# Patient Record
Sex: Male | Born: 1935 | Race: White | Hispanic: No | Marital: Single | Smoking: Never smoker
Health system: Southern US, Community
[De-identification: ages and names within clinical notes are randomized; demographics above are authoritative.]

## PROBLEM LIST (undated history)

## (undated) DIAGNOSIS — E782 Mixed hyperlipidemia: Secondary | ICD-10-CM

## (undated) DIAGNOSIS — K921 Melena: Secondary | ICD-10-CM

## (undated) DIAGNOSIS — F419 Anxiety disorder, unspecified: Secondary | ICD-10-CM

## (undated) DIAGNOSIS — N183 Chronic kidney disease, stage 3 unspecified: Secondary | ICD-10-CM

## (undated) DIAGNOSIS — J189 Pneumonia, unspecified organism: Secondary | ICD-10-CM

## (undated) DIAGNOSIS — E119 Type 2 diabetes mellitus without complications: Secondary | ICD-10-CM

## (undated) DIAGNOSIS — I10 Essential (primary) hypertension: Secondary | ICD-10-CM

## (undated) DIAGNOSIS — I350 Nonrheumatic aortic (valve) stenosis: Secondary | ICD-10-CM

## (undated) DIAGNOSIS — M1990 Unspecified osteoarthritis, unspecified site: Secondary | ICD-10-CM

## (undated) DIAGNOSIS — I429 Cardiomyopathy, unspecified: Secondary | ICD-10-CM

## (undated) DIAGNOSIS — K219 Gastro-esophageal reflux disease without esophagitis: Secondary | ICD-10-CM

## (undated) DIAGNOSIS — M549 Dorsalgia, unspecified: Secondary | ICD-10-CM

## (undated) DIAGNOSIS — I6529 Occlusion and stenosis of unspecified carotid artery: Secondary | ICD-10-CM

## (undated) DIAGNOSIS — I2510 Atherosclerotic heart disease of native coronary artery without angina pectoris: Secondary | ICD-10-CM

## (undated) DIAGNOSIS — Z87891 Personal history of nicotine dependence: Secondary | ICD-10-CM

## (undated) DIAGNOSIS — Z5189 Encounter for other specified aftercare: Secondary | ICD-10-CM

## (undated) DIAGNOSIS — I4891 Unspecified atrial fibrillation: Secondary | ICD-10-CM

## (undated) DIAGNOSIS — D46C Myelodysplastic syndrome with isolated del(5q) chromosomal abnormality: Secondary | ICD-10-CM

## (undated) DIAGNOSIS — J449 Chronic obstructive pulmonary disease, unspecified: Secondary | ICD-10-CM

## (undated) DIAGNOSIS — D649 Anemia, unspecified: Secondary | ICD-10-CM

## (undated) DIAGNOSIS — D696 Thrombocytopenia, unspecified: Secondary | ICD-10-CM

## (undated) DIAGNOSIS — I219 Acute myocardial infarction, unspecified: Secondary | ICD-10-CM

## (undated) HISTORY — DX: Myelodysplastic syndrome with isolated del(5q) chromosomal abnormality: D46.C

## (undated) HISTORY — PX: CHOLECYSTECTOMY: SHX55

## (undated) HISTORY — DX: Acute myocardial infarction, unspecified: I21.9

## (undated) HISTORY — PX: KNEE SURGERY: SHX244

## (undated) HISTORY — PX: HERNIA REPAIR: SHX51

## (undated) HISTORY — PX: BONE MARROW BIOPSY: SHX199

## (undated) HISTORY — PX: INGUINAL HERNIA REPAIR: SUR1180

## (undated) HISTORY — PX: EXPLORATION POST OPERATIVE OPEN HEART: SHX5061

## (undated) HISTORY — PX: CATARACT EXTRACTION, BILATERAL: SHX1313

## (undated) HISTORY — DX: Thrombocytopenia, unspecified: D69.6

## (undated) HISTORY — PX: SPINE SURGERY: SHX786

## (undated) HISTORY — DX: Unspecified atrial fibrillation: I48.91

## (undated) HISTORY — PX: BONE MARROW ASPIRATION: SHX1252

## (undated) HISTORY — DX: Encounter for other specified aftercare: Z51.89

## (undated) HISTORY — PX: BACK SURGERY: SHX140

## (undated) HISTORY — DX: Unspecified osteoarthritis, unspecified site: M19.90

## (undated) HISTORY — DX: Pneumonia, unspecified organism: J18.9

## (undated) HISTORY — PX: VENTRAL HERNIA REPAIR: SHX424

## (undated) HISTORY — PX: TONSILLECTOMY: SUR1361

## (undated) HISTORY — DX: Chronic obstructive pulmonary disease, unspecified: J44.9

---

## 1958-09-27 HISTORY — PX: KNEE SURGERY: SHX244

## 1958-09-27 HISTORY — PX: JOINT REPLACEMENT: SHX530

## 1988-09-27 HISTORY — PX: ROTATOR CUFF REPAIR: SHX139

## 1994-09-27 HISTORY — PX: PR VEIN BYPASS GRAFT,AORTO-FEM-POP: 35551

## 1994-09-27 HISTORY — PX: CORONARY ARTERY BYPASS GRAFT: SHX141

## 1998-09-12 ENCOUNTER — Ambulatory Visit (HOSPITAL_COMMUNITY): Admission: RE | Admit: 1998-09-12 | Discharge: 1998-09-13 | Payer: Self-pay | Admitting: Cardiology

## 2001-08-28 ENCOUNTER — Other Ambulatory Visit: Admission: RE | Admit: 2001-08-28 | Discharge: 2001-08-28 | Payer: Self-pay | Admitting: General Surgery

## 2001-09-05 ENCOUNTER — Encounter: Payer: Self-pay | Admitting: Cardiology

## 2001-09-05 ENCOUNTER — Ambulatory Visit (HOSPITAL_COMMUNITY): Admission: RE | Admit: 2001-09-05 | Discharge: 2001-09-05 | Payer: Self-pay | Admitting: Cardiology

## 2001-09-06 ENCOUNTER — Ambulatory Visit (HOSPITAL_COMMUNITY): Admission: RE | Admit: 2001-09-06 | Discharge: 2001-09-06 | Payer: Self-pay | Admitting: Cardiology

## 2001-09-06 ENCOUNTER — Encounter: Payer: Self-pay | Admitting: Cardiology

## 2001-09-12 ENCOUNTER — Ambulatory Visit (HOSPITAL_COMMUNITY): Admission: RE | Admit: 2001-09-12 | Discharge: 2001-09-12 | Payer: Self-pay | Admitting: Internal Medicine

## 2001-10-04 ENCOUNTER — Observation Stay (HOSPITAL_COMMUNITY): Admission: RE | Admit: 2001-10-04 | Discharge: 2001-10-05 | Payer: Self-pay | Admitting: General Surgery

## 2002-11-20 ENCOUNTER — Encounter: Payer: Self-pay | Admitting: Cardiology

## 2002-11-20 ENCOUNTER — Ambulatory Visit (HOSPITAL_COMMUNITY): Admission: RE | Admit: 2002-11-20 | Discharge: 2002-11-20 | Payer: Self-pay | Admitting: Cardiology

## 2003-01-02 ENCOUNTER — Encounter (HOSPITAL_COMMUNITY): Admission: RE | Admit: 2003-01-02 | Discharge: 2003-02-01 | Payer: Self-pay | Admitting: Cardiology

## 2003-09-11 ENCOUNTER — Ambulatory Visit (HOSPITAL_COMMUNITY): Admission: RE | Admit: 2003-09-11 | Discharge: 2003-09-11 | Payer: Self-pay | Admitting: Pulmonary Disease

## 2004-02-29 ENCOUNTER — Emergency Department (HOSPITAL_COMMUNITY): Admission: EM | Admit: 2004-02-29 | Discharge: 2004-02-29 | Payer: Self-pay | Admitting: Emergency Medicine

## 2004-03-04 ENCOUNTER — Observation Stay (HOSPITAL_COMMUNITY): Admission: AD | Admit: 2004-03-04 | Discharge: 2004-03-06 | Payer: Self-pay | Admitting: Pulmonary Disease

## 2004-05-25 ENCOUNTER — Ambulatory Visit (HOSPITAL_COMMUNITY): Admission: RE | Admit: 2004-05-25 | Discharge: 2004-05-25 | Payer: Self-pay | Admitting: Ophthalmology

## 2004-08-31 ENCOUNTER — Ambulatory Visit (HOSPITAL_COMMUNITY): Admission: RE | Admit: 2004-08-31 | Discharge: 2004-08-31 | Payer: Self-pay | Admitting: Ophthalmology

## 2006-02-25 HISTORY — PX: BACK SURGERY: SHX140

## 2006-03-10 ENCOUNTER — Ambulatory Visit (HOSPITAL_COMMUNITY): Admission: RE | Admit: 2006-03-10 | Discharge: 2006-03-11 | Payer: Self-pay | Admitting: Neurosurgery

## 2006-03-19 ENCOUNTER — Inpatient Hospital Stay (HOSPITAL_COMMUNITY): Admission: EM | Admit: 2006-03-19 | Discharge: 2006-03-29 | Payer: Self-pay | Admitting: Emergency Medicine

## 2006-03-20 ENCOUNTER — Ambulatory Visit: Payer: Self-pay | Admitting: Critical Care Medicine

## 2006-03-22 ENCOUNTER — Ambulatory Visit: Payer: Self-pay | Admitting: Cardiology

## 2006-03-22 ENCOUNTER — Encounter: Payer: Self-pay | Admitting: Cardiology

## 2006-03-22 ENCOUNTER — Encounter: Payer: Self-pay | Admitting: Critical Care Medicine

## 2006-03-23 ENCOUNTER — Ambulatory Visit: Payer: Self-pay | Admitting: Infectious Diseases

## 2006-05-18 ENCOUNTER — Ambulatory Visit: Payer: Self-pay | Admitting: Infectious Diseases

## 2006-05-31 ENCOUNTER — Inpatient Hospital Stay (HOSPITAL_COMMUNITY): Admission: AD | Admit: 2006-05-31 | Discharge: 2006-07-12 | Payer: Self-pay | Admitting: Neurosurgery

## 2006-05-31 ENCOUNTER — Ambulatory Visit: Payer: Self-pay | Admitting: Pulmonary Disease

## 2006-06-06 ENCOUNTER — Ambulatory Visit: Payer: Self-pay | Admitting: Physical Medicine & Rehabilitation

## 2006-06-08 ENCOUNTER — Encounter: Payer: Self-pay | Admitting: Vascular Surgery

## 2006-06-13 ENCOUNTER — Ambulatory Visit: Payer: Self-pay | Admitting: Cardiovascular Disease

## 2006-06-13 ENCOUNTER — Encounter: Payer: Self-pay | Admitting: Cardiovascular Disease

## 2006-07-12 ENCOUNTER — Ambulatory Visit: Payer: Self-pay | Admitting: Physical Medicine & Rehabilitation

## 2006-07-12 ENCOUNTER — Inpatient Hospital Stay (HOSPITAL_COMMUNITY)
Admission: RE | Admit: 2006-07-12 | Discharge: 2006-08-08 | Payer: Self-pay | Admitting: Physical Medicine & Rehabilitation

## 2006-07-28 HISTORY — PX: PEG PLACEMENT: SHX5437

## 2006-08-08 ENCOUNTER — Ambulatory Visit: Payer: Self-pay | Admitting: Internal Medicine

## 2006-08-08 ENCOUNTER — Inpatient Hospital Stay: Admission: AD | Admit: 2006-08-08 | Discharge: 2006-11-11 | Payer: Self-pay | Admitting: Pulmonary Disease

## 2006-10-07 ENCOUNTER — Ambulatory Visit (HOSPITAL_COMMUNITY): Admission: RE | Admit: 2006-10-07 | Discharge: 2006-10-07 | Payer: Self-pay | Admitting: Pulmonary Disease

## 2006-11-11 ENCOUNTER — Ambulatory Visit: Payer: Self-pay | Admitting: Internal Medicine

## 2007-02-16 ENCOUNTER — Encounter (HOSPITAL_COMMUNITY): Admission: RE | Admit: 2007-02-16 | Discharge: 2007-03-18 | Payer: Self-pay | Admitting: Pulmonary Disease

## 2007-02-22 ENCOUNTER — Ambulatory Visit: Payer: Self-pay | Admitting: Cardiology

## 2007-03-09 ENCOUNTER — Ambulatory Visit: Payer: Self-pay | Admitting: Internal Medicine

## 2007-03-14 ENCOUNTER — Ambulatory Visit (HOSPITAL_COMMUNITY): Admission: RE | Admit: 2007-03-14 | Discharge: 2007-03-14 | Payer: Self-pay | Admitting: Internal Medicine

## 2007-03-20 ENCOUNTER — Encounter (HOSPITAL_COMMUNITY): Admission: RE | Admit: 2007-03-20 | Discharge: 2007-04-19 | Payer: Self-pay | Admitting: Pulmonary Disease

## 2007-03-22 ENCOUNTER — Ambulatory Visit: Payer: Self-pay | Admitting: Cardiovascular Disease

## 2007-04-05 ENCOUNTER — Ambulatory Visit: Payer: Self-pay | Admitting: Cardiology

## 2007-04-12 ENCOUNTER — Ambulatory Visit (HOSPITAL_COMMUNITY): Admission: RE | Admit: 2007-04-12 | Discharge: 2007-04-12 | Payer: Self-pay | Admitting: Pulmonary Disease

## 2007-04-21 ENCOUNTER — Encounter (HOSPITAL_COMMUNITY): Admission: RE | Admit: 2007-04-21 | Discharge: 2007-05-22 | Payer: Self-pay | Admitting: Pulmonary Disease

## 2007-05-03 ENCOUNTER — Ambulatory Visit: Payer: Self-pay | Admitting: Cardiology

## 2007-05-22 ENCOUNTER — Encounter (HOSPITAL_COMMUNITY): Admission: RE | Admit: 2007-05-22 | Discharge: 2007-06-21 | Payer: Self-pay | Admitting: Pulmonary Disease

## 2007-05-31 ENCOUNTER — Ambulatory Visit: Payer: Self-pay | Admitting: Cardiology

## 2007-06-14 ENCOUNTER — Ambulatory Visit: Payer: Self-pay | Admitting: Cardiology

## 2007-06-21 ENCOUNTER — Ambulatory Visit: Payer: Self-pay | Admitting: Cardiology

## 2007-06-23 ENCOUNTER — Encounter (HOSPITAL_COMMUNITY): Admission: RE | Admit: 2007-06-23 | Discharge: 2007-06-27 | Payer: Self-pay | Admitting: Pulmonary Disease

## 2007-06-28 ENCOUNTER — Encounter (HOSPITAL_COMMUNITY): Admission: RE | Admit: 2007-06-28 | Discharge: 2007-07-28 | Payer: Self-pay | Admitting: Pulmonary Disease

## 2007-07-10 ENCOUNTER — Ambulatory Visit: Payer: Self-pay | Admitting: Internal Medicine

## 2007-07-24 ENCOUNTER — Ambulatory Visit: Payer: Self-pay | Admitting: Internal Medicine

## 2007-07-31 ENCOUNTER — Ambulatory Visit: Payer: Self-pay | Admitting: Cardiology

## 2007-08-07 ENCOUNTER — Ambulatory Visit: Payer: Self-pay | Admitting: Cardiology

## 2007-08-22 ENCOUNTER — Ambulatory Visit: Payer: Self-pay | Admitting: Cardiology

## 2007-08-28 ENCOUNTER — Ambulatory Visit: Payer: Self-pay | Admitting: Internal Medicine

## 2007-08-28 ENCOUNTER — Encounter (HOSPITAL_COMMUNITY): Admission: RE | Admit: 2007-08-28 | Discharge: 2007-09-27 | Payer: Self-pay | Admitting: Internal Medicine

## 2007-09-19 ENCOUNTER — Ambulatory Visit: Payer: Self-pay | Admitting: Cardiology

## 2007-10-10 ENCOUNTER — Ambulatory Visit: Payer: Self-pay | Admitting: Cardiology

## 2007-11-07 ENCOUNTER — Ambulatory Visit: Payer: Self-pay | Admitting: Cardiology

## 2007-12-05 ENCOUNTER — Ambulatory Visit: Payer: Self-pay | Admitting: Cardiology

## 2008-01-19 ENCOUNTER — Ambulatory Visit: Payer: Self-pay | Admitting: Internal Medicine

## 2008-02-16 ENCOUNTER — Ambulatory Visit: Payer: Self-pay | Admitting: Cardiovascular Disease

## 2008-03-15 ENCOUNTER — Ambulatory Visit (HOSPITAL_COMMUNITY): Admission: RE | Admit: 2008-03-15 | Discharge: 2008-03-15 | Payer: Self-pay | Admitting: Internal Medicine

## 2008-03-15 ENCOUNTER — Ambulatory Visit: Payer: Self-pay | Admitting: Internal Medicine

## 2008-03-22 ENCOUNTER — Ambulatory Visit: Payer: Self-pay | Admitting: Cardiovascular Disease

## 2008-04-05 ENCOUNTER — Ambulatory Visit: Payer: Self-pay | Admitting: Cardiology

## 2008-04-18 ENCOUNTER — Observation Stay (HOSPITAL_COMMUNITY): Admission: EM | Admit: 2008-04-18 | Discharge: 2008-04-21 | Payer: Self-pay | Admitting: Emergency Medicine

## 2008-05-10 ENCOUNTER — Ambulatory Visit: Payer: Self-pay | Admitting: Cardiology

## 2008-05-17 ENCOUNTER — Ambulatory Visit: Payer: Self-pay | Admitting: Cardiology

## 2008-06-04 ENCOUNTER — Ambulatory Visit: Payer: Self-pay | Admitting: Cardiology

## 2008-06-12 ENCOUNTER — Ambulatory Visit: Payer: Self-pay | Admitting: Cardiology

## 2008-06-18 ENCOUNTER — Ambulatory Visit: Payer: Self-pay | Admitting: Cardiology

## 2008-06-27 ENCOUNTER — Ambulatory Visit: Payer: Self-pay | Admitting: Cardiology

## 2008-07-11 ENCOUNTER — Ambulatory Visit: Payer: Self-pay | Admitting: Cardiology

## 2008-07-22 ENCOUNTER — Ambulatory Visit: Payer: Self-pay | Admitting: Cardiology

## 2008-07-29 ENCOUNTER — Ambulatory Visit: Payer: Self-pay | Admitting: Cardiology

## 2008-08-12 ENCOUNTER — Ambulatory Visit: Payer: Self-pay | Admitting: Cardiology

## 2008-08-30 ENCOUNTER — Ambulatory Visit: Payer: Self-pay | Admitting: Internal Medicine

## 2008-08-30 LAB — CONVERTED CEMR LAB
ALT: 24 units/L (ref 0–53)
Albumin: 3.6 g/dL (ref 3.5–5.2)
Bilirubin, Direct: 0.1 mg/dL (ref 0.0–0.3)
TSH: 2.37 microintl units/mL (ref 0.35–5.50)
Total Protein: 6.9 g/dL (ref 6.0–8.3)

## 2008-09-09 ENCOUNTER — Ambulatory Visit: Payer: Self-pay | Admitting: Cardiology

## 2008-09-30 ENCOUNTER — Ambulatory Visit (HOSPITAL_COMMUNITY): Admission: RE | Admit: 2008-09-30 | Discharge: 2008-09-30 | Payer: Self-pay | Admitting: Pulmonary Disease

## 2008-10-07 ENCOUNTER — Ambulatory Visit: Payer: Self-pay | Admitting: Cardiology

## 2008-11-04 ENCOUNTER — Ambulatory Visit: Payer: Self-pay | Admitting: Cardiology

## 2008-11-11 ENCOUNTER — Ambulatory Visit: Payer: Self-pay | Admitting: Cardiology

## 2008-12-02 ENCOUNTER — Ambulatory Visit: Payer: Self-pay | Admitting: Cardiology

## 2008-12-23 ENCOUNTER — Ambulatory Visit: Payer: Self-pay | Admitting: Cardiology

## 2009-01-20 ENCOUNTER — Ambulatory Visit: Payer: Self-pay | Admitting: Cardiology

## 2009-02-20 ENCOUNTER — Ambulatory Visit: Payer: Self-pay | Admitting: Cardiology

## 2009-03-24 ENCOUNTER — Ambulatory Visit: Payer: Self-pay | Admitting: Cardiology

## 2009-04-11 DIAGNOSIS — I4891 Unspecified atrial fibrillation: Secondary | ICD-10-CM

## 2009-04-11 DIAGNOSIS — M1990 Unspecified osteoarthritis, unspecified site: Secondary | ICD-10-CM | POA: Insufficient documentation

## 2009-04-11 DIAGNOSIS — I2510 Atherosclerotic heart disease of native coronary artery without angina pectoris: Secondary | ICD-10-CM

## 2009-04-11 DIAGNOSIS — E785 Hyperlipidemia, unspecified: Secondary | ICD-10-CM

## 2009-04-11 DIAGNOSIS — I739 Peripheral vascular disease, unspecified: Secondary | ICD-10-CM | POA: Insufficient documentation

## 2009-04-14 ENCOUNTER — Ambulatory Visit: Payer: Self-pay | Admitting: Internal Medicine

## 2009-04-17 ENCOUNTER — Ambulatory Visit (HOSPITAL_COMMUNITY): Admission: RE | Admit: 2009-04-17 | Discharge: 2009-04-17 | Payer: Self-pay | Admitting: Internal Medicine

## 2009-04-17 ENCOUNTER — Encounter: Payer: Self-pay | Admitting: Internal Medicine

## 2009-04-21 ENCOUNTER — Ambulatory Visit: Payer: Self-pay | Admitting: Cardiology

## 2009-05-12 ENCOUNTER — Encounter: Payer: Self-pay | Admitting: *Deleted

## 2009-05-26 ENCOUNTER — Ambulatory Visit: Payer: Self-pay | Admitting: Cardiology

## 2009-06-23 ENCOUNTER — Ambulatory Visit: Payer: Self-pay | Admitting: Cardiology

## 2009-06-23 LAB — CONVERTED CEMR LAB: POC INR: 2.6

## 2009-07-21 ENCOUNTER — Ambulatory Visit: Payer: Self-pay | Admitting: Cardiology

## 2009-07-21 LAB — CONVERTED CEMR LAB: POC INR: 3.3

## 2009-08-18 ENCOUNTER — Ambulatory Visit: Payer: Self-pay | Admitting: Cardiology

## 2009-09-25 ENCOUNTER — Ambulatory Visit: Payer: Self-pay | Admitting: Cardiology

## 2009-09-25 LAB — CONVERTED CEMR LAB: POC INR: 2.3

## 2009-10-06 ENCOUNTER — Ambulatory Visit: Payer: Self-pay | Admitting: Internal Medicine

## 2009-10-08 ENCOUNTER — Telehealth: Payer: Self-pay | Admitting: Internal Medicine

## 2009-10-23 ENCOUNTER — Ambulatory Visit: Payer: Self-pay | Admitting: Cardiovascular Disease

## 2009-10-23 LAB — CONVERTED CEMR LAB: POC INR: 1.9

## 2009-11-12 ENCOUNTER — Ambulatory Visit: Payer: Self-pay | Admitting: Cardiovascular Disease

## 2009-11-12 LAB — CONVERTED CEMR LAB: POC INR: 2.1

## 2009-12-11 ENCOUNTER — Ambulatory Visit: Payer: Self-pay | Admitting: Cardiology

## 2009-12-11 LAB — CONVERTED CEMR LAB: POC INR: 2.1

## 2010-01-07 ENCOUNTER — Ambulatory Visit: Payer: Self-pay | Admitting: Cardiology

## 2010-01-07 LAB — CONVERTED CEMR LAB: POC INR: 2.2

## 2010-02-02 ENCOUNTER — Ambulatory Visit: Payer: Self-pay | Admitting: Cardiology

## 2010-02-02 LAB — CONVERTED CEMR LAB: POC INR: 2.5

## 2010-03-02 ENCOUNTER — Ambulatory Visit (HOSPITAL_COMMUNITY): Admission: RE | Admit: 2010-03-02 | Discharge: 2010-03-02 | Payer: Self-pay | Admitting: General Surgery

## 2010-03-02 ENCOUNTER — Ambulatory Visit: Payer: Self-pay | Admitting: Cardiology

## 2010-04-01 ENCOUNTER — Ambulatory Visit: Payer: Self-pay | Admitting: Cardiovascular Disease

## 2010-04-27 ENCOUNTER — Ambulatory Visit: Payer: Self-pay | Admitting: Cardiology

## 2010-05-01 ENCOUNTER — Telehealth: Payer: Self-pay | Admitting: Internal Medicine

## 2010-05-25 ENCOUNTER — Ambulatory Visit: Payer: Self-pay | Admitting: Cardiology

## 2010-05-28 HISTORY — PX: CHOLECYSTECTOMY: SHX55

## 2010-06-04 ENCOUNTER — Ambulatory Visit (HOSPITAL_COMMUNITY)
Admission: RE | Admit: 2010-06-04 | Discharge: 2010-06-07 | Payer: Self-pay | Source: Home / Self Care | Admitting: General Surgery

## 2010-06-04 ENCOUNTER — Encounter (INDEPENDENT_AMBULATORY_CARE_PROVIDER_SITE_OTHER): Payer: Self-pay | Admitting: General Surgery

## 2010-06-15 ENCOUNTER — Ambulatory Visit: Payer: Self-pay | Admitting: Cardiology

## 2010-06-25 ENCOUNTER — Ambulatory Visit: Payer: Self-pay | Admitting: Cardiology

## 2010-06-25 LAB — CONVERTED CEMR LAB: POC INR: 2.1

## 2010-07-13 ENCOUNTER — Ambulatory Visit: Payer: Self-pay | Admitting: Cardiology

## 2010-07-13 LAB — CONVERTED CEMR LAB: POC INR: 2.3

## 2010-08-12 ENCOUNTER — Ambulatory Visit: Payer: Self-pay | Admitting: Cardiology

## 2010-08-12 LAB — CONVERTED CEMR LAB: POC INR: 1.9

## 2010-09-09 ENCOUNTER — Ambulatory Visit: Payer: Self-pay | Admitting: Cardiovascular Disease

## 2010-10-01 ENCOUNTER — Ambulatory Visit: Admission: RE | Admit: 2010-10-01 | Discharge: 2010-10-01 | Payer: Self-pay | Source: Home / Self Care

## 2010-10-16 ENCOUNTER — Encounter: Payer: Self-pay | Admitting: Internal Medicine

## 2010-10-16 ENCOUNTER — Ambulatory Visit
Admission: RE | Admit: 2010-10-16 | Discharge: 2010-10-16 | Payer: Self-pay | Source: Home / Self Care | Attending: Internal Medicine | Admitting: Internal Medicine

## 2010-10-16 DIAGNOSIS — I6529 Occlusion and stenosis of unspecified carotid artery: Secondary | ICD-10-CM | POA: Insufficient documentation

## 2010-10-17 ENCOUNTER — Encounter: Payer: Self-pay | Admitting: Physical Medicine & Rehabilitation

## 2010-10-18 ENCOUNTER — Encounter: Payer: Self-pay | Admitting: Pulmonary Disease

## 2010-10-19 ENCOUNTER — Encounter: Payer: Self-pay | Admitting: Internal Medicine

## 2010-10-25 LAB — CONVERTED CEMR LAB
ALT: 18 units/L (ref 0–53)
ALT: 19 units/L (ref 0–53)
Alkaline Phosphatase: 80 units/L (ref 39–117)
Bilirubin, Direct: 0 mg/dL (ref 0.0–0.3)
Bilirubin, Direct: 0.3 mg/dL (ref 0.0–0.3)
Chloride: 105 meq/L (ref 96–112)
Cholesterol: 152 mg/dL (ref 0–200)
Creatinine, Ser: 1.4 mg/dL (ref 0.4–1.5)
Indirect Bilirubin: 0.6 mg/dL (ref 0.0–0.9)
TSH: 2.16 microintl units/mL (ref 0.35–5.50)
Total Bilirubin: 0.9 mg/dL (ref 0.3–1.2)
VLDL: 29.8 mg/dL (ref 0.0–40.0)
VLDL: 49 mg/dL — ABNORMAL HIGH (ref 0–40)

## 2010-10-28 ENCOUNTER — Telehealth: Payer: Self-pay | Admitting: Internal Medicine

## 2010-10-28 ENCOUNTER — Other Ambulatory Visit: Payer: Self-pay | Admitting: Internal Medicine

## 2010-10-28 DIAGNOSIS — I6529 Occlusion and stenosis of unspecified carotid artery: Secondary | ICD-10-CM

## 2010-10-29 ENCOUNTER — Ambulatory Visit (HOSPITAL_COMMUNITY)
Admission: RE | Admit: 2010-10-29 | Discharge: 2010-10-29 | Disposition: A | Discharge status: Home / Self Care | Payer: Medicare Other | Source: Ambulatory Visit | Attending: Internal Medicine | Admitting: Internal Medicine

## 2010-10-29 ENCOUNTER — Encounter: Payer: Self-pay | Admitting: Cardiology

## 2010-10-29 ENCOUNTER — Encounter (INDEPENDENT_AMBULATORY_CARE_PROVIDER_SITE_OTHER): Payer: Medicare Other

## 2010-10-29 ENCOUNTER — Ambulatory Visit: Admit: 2010-10-29 | Payer: Self-pay

## 2010-10-29 ENCOUNTER — Ambulatory Visit (HOSPITAL_COMMUNITY): Payer: Medicare Other

## 2010-10-29 DIAGNOSIS — I6529 Occlusion and stenosis of unspecified carotid artery: Secondary | ICD-10-CM | POA: Insufficient documentation

## 2010-10-29 DIAGNOSIS — I4891 Unspecified atrial fibrillation: Secondary | ICD-10-CM

## 2010-10-29 DIAGNOSIS — Z7901 Long term (current) use of anticoagulants: Secondary | ICD-10-CM

## 2010-10-29 DIAGNOSIS — I10 Essential (primary) hypertension: Secondary | ICD-10-CM | POA: Insufficient documentation

## 2010-10-29 LAB — CONVERTED CEMR LAB: POC INR: 2.3

## 2010-10-29 NOTE — Progress Notes (Signed)
Summary: lab results  Phone Note Call from Patient Call back at Home Phone [phone]   Caller: Patient Reason for Call: Talk to Nurse, Lab or Test Results Summary of Call: pt calling back for lab results Initial call taken by: Edman Circle,  October 08, 2009 1:54 PM  Follow-up for Phone Call        PT'S WIFE AWARE OF LAB RESULTS. Follow-up by: Scherrie Bateman, LPN,  October 08, 2009 2:06 PM

## 2010-10-29 NOTE — Medication Information (Signed)
Summary: ccr-lr  Anticoagulant Therapy  Managed by: Vashti Hey, RN PCP: Dr Shaune Pollack in [place] MD: Dietrich Pates MD, Molly Maduro Indication 1: Atrial Fibrillation (ICD-427.31) Lab Used: [facility] [place] Site: [place] INR POC 3.0  Dietary changes: no    Health status changes: no    Bleeding/hemorrhagic complications: no    Recent/future hospitalizations: no    Any changes in medication regimen? no    Recent/future dental: no  Any missed doses?: no       Is patient compliant with meds? yes       Allergies: No Known Drug Allergies  Anticoagulation Management History:      The patient is taking warfarin and comes in today for a routine follow up visit.  Positive risk factors for bleeding include an age of 75 years or older.  The bleeding index is 'intermediate risk'.  Positive CHADS2 values include Age > 49 years old.  The start date was 12/01/2006.  Anticoagulation responsible provider: Dietrich Pates MD, Molly Maduro.  INR POC: 3.0.  Cuvette Lot#: [id number].  Exp: 10/11.    Anticoagulation Management Assessment/Plan:      The patient's current anticoagulation dose is Warfarin sodium 5 mg tabs: Use as directed by Anticoagulation Clinic.  The target INR is 2 - 3.  The next INR is due 04/01/2010.  Anticoagulation instructions were given to patient.  Results were reviewed/authorized by Vashti Hey, RN.  He was notified by Vashti Hey RN.         Prior Anticoagulation Instructions: INR 2.5 Continue coumadin 5mg  once daily except 7.5mg  on Mondays  Current Anticoagulation Instructions: INR 3.0 Continue coumadin 5mg  once daily except 7.5mg  on Mondays

## 2010-10-29 NOTE — Medication Information (Signed)
Summary: ccr-lr  Anticoagulant Therapy  Managed by: Vashti Hey, RN PCP: Dr Shaune Pollack in [place] MD: Diona Browner MD, Remi Deter Indication 1: Atrial Fibrillation (ICD-427.31) Lab Used: [facility] [place] Site: [place] INR POC 1.7  Dietary changes: no    Health status changes: no    Bleeding/hemorrhagic complications: no    Recent/future hospitalizations: no    Any changes in medication regimen? no    Recent/future dental: no  Any missed doses?: no       Is patient compliant with meds? yes       Allergies: No Known Drug Allergies  Anticoagulation Management History:      The patient is taking warfarin and comes in today for a routine follow up visit.  Positive risk factors for bleeding include an age of 10 years or older.  The bleeding index is 'intermediate risk'.  Positive CHADS2 values include Age > 72 years old.  The start date was 12/01/2006.  Anticoagulation responsible provider: Diona Browner MD, Remi Deter.  INR POC: 1.7.  Cuvette Lot#: [id number].  Exp: 10/11.    Anticoagulation Management Assessment/Plan:      The patient's current anticoagulation dose is Warfarin sodium 5 mg tabs: Use as directed by Anticoagulation Clinic.  The target INR is 2 - 3.  The next INR is due 10/01/2010.  Anticoagulation instructions were given to patient.  Results were reviewed/authorized by Vashti Hey, RN.  He was notified by Vashti Hey RN.         Prior Anticoagulation Instructions: INR 1.9 Take coumadin 1 1/2 tablets tonight then resume 1 tablet once daily except 1 1/2 tablets on Mondays  Current Anticoagulation Instructions: INR 1.7 Increase coumadin 5mg  once daily except 7.5mg  on Mondays, Wednesdays and Fridays

## 2010-10-29 NOTE — Medication Information (Signed)
Summary: ccr-lr  Anticoagulant Therapy  Managed by: Vashti Hey, RN PCP: Dr Shaune Pollack in [place] MD: Dietrich Pates MD, Molly Maduro Indication 1: Atrial Fibrillation (ICD-427.31) Lab Used: [facility]  Site: [place] INR POC 1.9  Dietary changes: no    Health status changes: no    Bleeding/hemorrhagic complications: no    Recent/future hospitalizations: no    Any changes in medication regimen? no    Recent/future dental: no  Any missed doses?: no       Is patient compliant with meds? yes       Allergies: No Known Drug Allergies  Anticoagulation Management History:      The patient is taking warfarin and comes in today for a routine follow up visit.  Positive risk factors for bleeding include an age of 75 years or older.  The bleeding index is 'intermediate risk'.  Positive CHADS2 values include Age > 30 years old.  The start date was 12/01/2006.  Anticoagulation responsible provider: Dietrich Pates MD, Molly Maduro.  INR POC: 1.9.  Cuvette Lot#: [id number].  Exp: 10/11.    Anticoagulation Management Assessment/Plan:      The patient's current anticoagulation dose is Warfarin sodium 5 mg tabs: Use as directed by Anticoagulation Clinic.  The target INR is 2 - 3.  The next INR is due 05/25/2010.  Anticoagulation instructions were given to patient.  Results were reviewed/authorized by Vashti Hey, RN.  He was notified by Vashti Hey RN.         Prior Anticoagulation Instructions: INR 2.3 Continue coumadin 5mg  once daily except 7.5mg  on Mondays  Current Anticoagulation Instructions: INR 1.9 Take coumadin 7.5mg  tonight and tomorrow night then resume 5mg  once daily except 7.5mg  on Mondays Pending hernia surgery.  Will call for INR appt when procedure is scheduled.

## 2010-10-29 NOTE — Medication Information (Signed)
Summary: ccr-lr  Anticoagulant Therapy  Managed by: Vashti Hey, RN PCP: Dr Shaune Pollack in [place] MD: Eden Emms MD, Theron Arista Indication 1: Atrial Fibrillation (ICD-427.31) Lab Used: [facility] [place] Site: [place] INR POC 2.0  Dietary changes: no    Health status changes: no    Bleeding/hemorrhagic complications: no    Recent/future hospitalizations: no    Any changes in medication regimen? no    Recent/future dental: no  Any missed doses?: no       Is patient compliant with meds? yes       Allergies: No Known Drug Allergies  Anticoagulation Management History:      The patient is taking warfarin and comes in today for a routine follow up visit.  Positive risk factors for bleeding include an age of 75 years or older.  The bleeding index is 'intermediate risk'.  Positive CHADS2 values include Age > 50 years old.  The start date was 12/01/2006.  Anticoagulation responsible provider: Eden Emms MD, Theron Arista.  INR POC: 2.0.  Cuvette Lot#: [id number].  Exp: 10/11.    Anticoagulation Management Assessment/Plan:      The patient's current anticoagulation dose is Warfarin sodium 5 mg tabs: Use as directed by Anticoagulation Clinic.  The target INR is 2 - 3.  The next INR is due 04/27/2010.  Anticoagulation instructions were given to patient.  Results were reviewed/authorized by Vashti Hey, RN.  He was notified by Vashti Hey RN.         Prior Anticoagulation Instructions: INR 3.0 Continue coumadin 5mg  once daily except 7.5mg  on Mondays  Current Anticoagulation Instructions: INR 2.0 Continue coumadin 5mg  once daily except 7.5mg  on Mondays

## 2010-10-29 NOTE — Medication Information (Signed)
Summary: ccr-lr  Anticoagulant Therapy  Managed by: Vashti Hey, RN PCP: Dr Shaune Pollack in [place] MD: Dietrich Pates MD, Molly Maduro Indication 1: Atrial Fibrillation (ICD-427.31) Lab Used: [facility] [place] Site: [place] INR POC 2.4  Dietary changes: no    Health status changes: no    Bleeding/hemorrhagic complications: no    Recent/future hospitalizations: no    Any changes in medication regimen? no    Recent/future dental: no  Any missed doses?: no       Is patient compliant with meds? yes       Allergies: No Known Drug Allergies  Anticoagulation Management History:      The patient is taking warfarin and comes in today for a routine follow up visit.  Positive risk factors for bleeding include an age of 75 years or older.  The bleeding index is 'intermediate risk'.  Positive CHADS2 values include Age > 58 years old.  The start date was 12/01/2006.  Anticoagulation responsible provider: Dietrich Pates MD, Molly Maduro.  INR POC: 2.4.  Exp: 10/11.    Anticoagulation Management Assessment/Plan:      The patient's current anticoagulation dose is Warfarin sodium 5 mg tabs: Use as directed by Anticoagulation Clinic.  The target INR is 2 - 3.  The next INR is due 10/29/2010.  Anticoagulation instructions were given to patient.  Results were reviewed/authorized by Vashti Hey, RN.  He was notified by Vashti Hey RN.         Prior Anticoagulation Instructions: INR 1.7 Increase coumadin 5mg  once daily except 7.5mg  on Mondays, Wednesdays and Fridays  Current Anticoagulation Instructions: INR 2.4 Pt has been taking wrong dose Continue coumadin 5mg  once daily except 7.5mg  on Mondays  and Fridays

## 2010-10-29 NOTE — Assessment & Plan Note (Signed)
Summary: 6 month rov/sl   Visit Type:  Follow-up Primary Provider:  Dr Shaune Pollack in [place]  CC:  back and leg pain.  History of Present Illness: [name] is a 77 year old with a history of CAD (s/p CABG in 1996; myoview 2010 (no ischemia); CV disease (moderate), hypertension, dyslipidemia and atrial fibrillation.  He also has a hx of DJD. I last saw him in July SInce seen, he denies chest pain.  Breathing is unchanged.  His biggest complaint is back and R leg pain (thigh)  Current Medications (verified): 1)  Amlodipine Besylate 5 Mg Tabs (Amlodipine Besylate) .Marland Kitchen.. 1 Tab Once Daily 2)  Warfarin Sodium 5 Mg Tabs (Warfarin Sodium) .... Use As Directed By Anticoagulation Clinic 3)  Aspirin 81 Mg Tbec (Aspirin) .... Take One Tablet By Mouth Daily 4)  Flexeril 10 Mg Tabs (Cyclobenzaprine Hcl) .Marland Kitchen.. 1 Tab As Needed For Back Pain 5)  Amiodarone Hcl 200 Mg Tabs (Amiodarone Hcl) .... Take One-Half Tablet By Mouth Daily 6)  Rapaflo 8 Mg Caps (Silodosin) .Marland Kitchen.. 1 Tab Once Daily in Place of Flomax 7)  Metoprolol Tartrate 25 Mg Tabs (Metoprolol Tartrate) .... Take One Tablet By Mouth Twice A Day  Allergies (verified): No Known Drug Allergies  Past History:  Past Medical History: Last updated: 18-Apr-2009 Current Problems:  PVD (ICD-443.9) ATRIAL FIBRILLATION (ICD-427.31) DYSLIPIDEMIA (ICD-272.4) OSTEOARTHRITIS (ICD-715.90) CAD (ICD-414.00)  Past Surgical History: Last updated: 18-Apr-2009  1. He has had coronary artery bypass grafting.   2. Previous back surgery.   Family History: Last updated: 04-18-09 Mother died of coronary disease at the age of 52.  His  father died of gastric cancer in his 57's.  Social History: Last updated: 04/18/2009 He is retired.  He is a nonsmoker.  He does not drink   any alcohol.  He does not use any illicit drugs.  He lives at home with   his wife.      Review of Systems       All systems reviewed.  Negative to the above problem except as  noted  Vital Signs:  Patient profile:   77 year old male Height:      72 inches Weight:      215 pounds BMI:     29.26 Pulse rate:   66 / minute BP sitting:   156 / 79  (left arm) Cuff size:   large  Vitals Entered By: Caleb Kanaris, CNA (October 06, 2009 9:47 AM)  Physical Exam  General:  Well developed, well nourished, in no acute distress. Head:  normocephalic and atraumatic Neck:  JVP is normal Lungs:  CTA. No rales or wheezes. Heart:  RRR.  S1, S2.  No S3.  No signfi murmurs. Abdomen:  Obese.  Ventral hernia reduces. No hepatomegaly Extremities:  2+ posterior tibial.  No edema.   EKG  Procedure date:  10/06/2009  Findings:      NSR>  66 bpm.  Nonspecific ST T wave changes.  Impression & Recommendations:  Problem # 1:  CAD (ICD-414.00) No sxs of ischemia.  KEep on current regimen.  Problem # 2:  ATRIAL FIBRILLATION (ICD-427.31) INterrmitt.  ON Amio.  Check labs. His updated medication list for this problem includes:    Warfarin Sodium 5 Mg Tabs (Warfarin sodium) ..... Use as directed by anticoagulation clinic    Aspirin 81 Mg Tbec (Aspirin) .Marland Kitchen... Take one tablet by mouth daily    Amiodarone Hcl 200 Mg Tabs (Amiodarone hcl) .Marland Kitchen... Take one-half tablet by  mouth daily    Metoprolol Tartrate 25 Mg Tabs (Metoprolol tartrate) .Marland Kitchen... Take one tablet by mouth twice a day  Orders: EKG w/ Interpretation (93000) TLB-BMP (Basic Metabolic Panel-BMET) (80048-METABOL) TLB-Hepatic/Liver Function Pnl (80076-HEPATIC) TLB-TSH (Thyroid Stimulating Hormone) (84443-TSH)  Problem # 3:  DYSLIPIDEMIA (ICD-272.4) Needs labs. Orders: TLB-Hepatic/Liver Function Pnl (80076-HEPATIC) TLB-Lipid Panel (80061-LIPID)  Problem # 4:  PVD (ICD-443.9) Will need f/u carotid this summer.  Patient Instructions: 1)  Your physician recommends that you schedule a follow-up appointment in: 9 months. You will receive a reminder 2 months prior appointment date. 2)  Your physician recommends that you  return for lab work [id number] Lipid, LFT, BMET and TSH.

## 2010-10-29 NOTE — Progress Notes (Signed)
Summary: stop caoumadin  Phone Note From Other Clinic   Caller: Provider Summary of Call: Per Dr Zachery Dakins at [facility] pt needs gallbadder surgery and hernia repair. pt needs to stop coumadin 5-7 days prior to procedure. ofc [id number] fax [phone].  Initial call taken by: Edman Circle,  May 01, 2010 9:16 AM  Follow-up for Phone Call        Will foward to Dr Tenny Craw she is here on Mon and can clear the pt then Spoke with Robin at [facility] the surgery is not scheduled yet.  Office aware Dr Tenny Craw will address next week and Annice Pih will call after she reviews Dennis Bast, RN, BSN  May 01, 2010 10:41 AM  Additional Follow-up for Phone Call Additional follow up Details #1::        Patient seen in January.  Doing well at that time.  If no new chest pains ok to proceed with surgery.  Can stop coumadin.  Resume after. Additional Follow-up by: Sherrill Raring, MD, [facility],  May 01, 2010 10:25 PM     Appended Document: stop caoumadin [facility] for call back to check status.  Appended Document: stop caoumadin spoke w/pts wife pt has had no chest pain and has been doing ok since Jan, pts wife aware Dr Tenny Craw cleared him for surgery and gave ok to hold his coumadin, Dr Annette Stable office aware, note faxed

## 2010-10-29 NOTE — Medication Information (Signed)
Summary: protime/tg  Anticoagulant Therapy  Managed by: Vashti Hey, RN PCP: Dr Shaune Pollack in [place] MD: Dietrich Pates MD, Molly Maduro Indication 1: Atrial Fibrillation (ICD-427.31) Lab Used: [facility] [place] Site: [place] INR POC 1.4  Dietary changes: no    Health status changes: no    Bleeding/hemorrhagic complications: no    Recent/future hospitalizations: yes       Details: Had gallbladder surgery and hernia repair on 06/04/10  Any changes in medication regimen? no    Recent/future dental: no  Any missed doses?: yes     Details: was off coumadin 7 days prior to surgery  Is patient compliant with meds? yes       Allergies: No Known Drug Allergies  Anticoagulation Management History:      The patient is taking warfarin and comes in today for a routine follow up visit.  Positive risk factors for bleeding include an age of 75 years or older.  The bleeding index is 'intermediate risk'.  Positive CHADS2 values include Age > 34 years old.  The start date was 12/01/2006.  Anticoagulation responsible provider: Dietrich Pates MD, Molly Maduro.  INR POC: 1.4.  Cuvette Lot#: [id number].  Exp: 10/11.    Anticoagulation Management Assessment/Plan:      The patient's current anticoagulation dose is Warfarin sodium 5 mg tabs: Use as directed by Anticoagulation Clinic.  The target INR is 2 - 3.  The next INR is due 05/25/2010.  Anticoagulation instructions were given to patient.  Results were reviewed/authorized by Vashti Hey, RN.  He was notified by Vashti Hey RN.         Prior Anticoagulation Instructions: INR 1.9 Take coumadin 7.5mg  tonight and tomorrow night then resume 5mg  once daily except 7.5mg  on Mondays Pending hernia surgery.  Will call for INR appt when procedure is scheduled.   Current Anticoagulation Instructions: INR 1.4 Was off coumadin 7 days for surgery Take coumadin 2 tablets tonight and tomorrow night then resume 5mg  once daily except 7.5mg  on  Mondays

## 2010-10-29 NOTE — Medication Information (Signed)
Summary: ccr-lr  Anticoagulant Therapy  Managed by: Vashti Hey, RN PCP: Dr Shaune Pollack in [place] MD: Dietrich Pates MD, Molly Maduro Indication 1: Atrial Fibrillation (ICD-427.31) Lab Used: [facility] [place] Site: [place] INR POC 2.2  Dietary changes: no    Health status changes: no    Bleeding/hemorrhagic complications: no    Recent/future hospitalizations: no    Any changes in medication regimen? no    Recent/future dental: no  Any missed doses?: no       Is patient compliant with meds? yes       Allergies: No Known Drug Allergies  Anticoagulation Management History:      The patient is taking warfarin and comes in today for a routine follow up visit.  Positive risk factors for bleeding include an age of 75 years or older.  The bleeding index is 'intermediate risk'.  Positive CHADS2 values include Age > 75 years old.  The start date was 12/01/2006.  Anticoagulation responsible provider: Dietrich Pates MD, Molly Maduro.  INR POC: 2.2.  Cuvette Lot#: [id number].  Exp: 10/11.    Anticoagulation Management Assessment/Plan:      The patient's current anticoagulation dose is Warfarin sodium 5 mg tabs: Use as directed by Anticoagulation Clinic.  The target INR is 2 - 3.  The next INR is due 02/02/2010.  Anticoagulation instructions were given to patient.  Results were reviewed/authorized by Vashti Hey, RN.  He was notified by Vashti Hey RN.         Prior Anticoagulation Instructions: INR 2.1 Continue coumadin 5mg  once daily except 7.5mg  on Mondays   Current Anticoagulation Instructions: INR 2.2 Continue coumadin 5mg  once daily except 7.5mg  on Mondays

## 2010-10-29 NOTE — Medication Information (Signed)
Summary: ccr-lr  Anticoagulant Therapy  Managed by: Vashti Hey, RN PCP: Dr Shaune Pollack in [place] MD: Dietrich Pates MD, Molly Maduro Indication 1: Atrial Fibrillation (ICD-427.31) Lab Used: [facility] [place] Site: [place] INR POC 2.5  Dietary changes: no    Health status changes: no    Bleeding/hemorrhagic complications: no    Recent/future hospitalizations: no    Any changes in medication regimen? no    Recent/future dental: no  Any missed doses?: no       Is patient compliant with meds? yes       Allergies: No Known Drug Allergies  Anticoagulation Management History:      The patient is taking warfarin and comes in today for a routine follow up visit.  Positive risk factors for bleeding include an age of 75 years or older.  The bleeding index is 'intermediate risk'.  Positive CHADS2 values include Age > 33 years old.  The start date was 12/01/2006.  Anticoagulation responsible provider: Dietrich Pates MD, Molly Maduro.  INR POC: 2.5.  Cuvette Lot#: [id number].  Exp: 10/11.    Anticoagulation Management Assessment/Plan:      The patient's current anticoagulation dose is Warfarin sodium 5 mg tabs: Use as directed by Anticoagulation Clinic.  The target INR is 2 - 3.  The next INR is due 03/02/2010.  Anticoagulation instructions were given to patient.  Results were reviewed/authorized by Vashti Hey, RN.  He was notified by Vashti Hey RN.         Prior Anticoagulation Instructions: INR 2.2 Continue coumadin 5mg  once daily except 7.5mg  on Mondays  Current Anticoagulation Instructions: INR 2.5 Continue coumadin 5mg  once daily except 7.5mg  on Mondays

## 2010-10-29 NOTE — Medication Information (Signed)
Summary: ccr-lr  Anticoagulant Therapy  Managed by: Vashti Hey, RN PCP: Dr Shaune Pollack in [place] MD: Eden Emms MD, Theron Arista Indication 1: Atrial Fibrillation (ICD-427.31) Lab Used: [facility] [place] Site: [place] INR POC 2.1  Dietary changes: no    Health status changes: no    Bleeding/hemorrhagic complications: no    Recent/future hospitalizations: no    Any changes in medication regimen? no    Recent/future dental: no  Any missed doses?: no       Is patient compliant with meds? yes       Allergies: No Known Drug Allergies  Anticoagulation Management History:      The patient is taking warfarin and comes in today for a routine follow up visit.  Positive risk factors for bleeding include an age of 7 years or older.  The bleeding index is 'intermediate risk'.  Positive CHADS2 values include Age > 60 years old.  The start date was 12/01/2006.  Anticoagulation responsible provider: Eden Emms MD, Theron Arista.  INR POC: 2.1.  Cuvette Lot#: [id number].  Exp: 10/11.    Anticoagulation Management Assessment/Plan:      The patient's current anticoagulation dose is Warfarin sodium 5 mg tabs: Use as directed by Anticoagulation Clinic.  The target INR is 2 - 3.  The next INR is due 12/11/2009.  Anticoagulation instructions were given to patient.  Results were reviewed/authorized by Vashti Hey, RN.  He was notified by Vashti Hey RN.         Prior Anticoagulation Instructions: INR 1.9 Take coumadin 2 tablets tonight then resume 1 tablet once daily except 1 1/2 tablets on Mondays  Current Anticoagulation Instructions: INR 2.1 Continue coumadin 5mg  once daily except 7.5mg  on Mondays

## 2010-10-29 NOTE — Medication Information (Signed)
Summary: ccr-lr  Anticoagulant Therapy  Managed by: Vashti Hey, RN PCP: Dr Shaune Pollack in [place] MD: Dietrich Pates MD, Molly Maduro Indication 1: Atrial Fibrillation (ICD-427.31) Lab Used: [facility] [place] Site: [place] INR POC 1.9  Dietary changes: no    Health status changes: no    Bleeding/hemorrhagic complications: no    Recent/future hospitalizations: no    Any changes in medication regimen? no    Recent/future dental: no  Any missed doses?: no       Is patient compliant with meds? yes       Allergies: No Known Drug Allergies  Anticoagulation Management History:      The patient is taking warfarin and comes in today for a routine follow up visit.  Positive risk factors for bleeding include an age of 75 years or older.  The bleeding index is 'intermediate risk'.  Positive CHADS2 values include Age > 14 years old.  The start date was 12/01/2006.  Anticoagulation responsible provider: Dietrich Pates MD, Molly Maduro.  INR POC: 1.9.  Cuvette Lot#: [id number].  Exp: 10/11.    Anticoagulation Management Assessment/Plan:      The patient's current anticoagulation dose is Warfarin sodium 5 mg tabs: Use as directed by Anticoagulation Clinic.  The target INR is 2 - 3.  The next INR is due 09/09/2010.  Anticoagulation instructions were given to patient.  Results were reviewed/authorized by Vashti Hey, RN.  He was notified by Vashti Hey RN.         Prior Anticoagulation Instructions: INR 2.3 Continue coumadin 5mg  once daily except 7.5mg  on Mondays  Current Anticoagulation Instructions: INR 1.9 Take coumadin 1 1/2 tablets tonight then resume 1 tablet once daily except 1 1/2 tablets on Mondays

## 2010-10-29 NOTE — Medication Information (Signed)
Summary: ccr-lr  Anticoagulant Therapy  Managed by: Vashti Hey, RN PCP: Dr Shaune Pollack in [place] MD: Diona Browner MD, Remi Deter Indication 1: Atrial Fibrillation (ICD-427.31) Lab Used: [facility] [place] Site: [place] INR POC 2.3  Dietary changes: no    Health status changes: no    Bleeding/hemorrhagic complications: no    Recent/future hospitalizations: no    Any changes in medication regimen? no    Recent/future dental: no  Any missed doses?: no       Is patient compliant with meds? yes       Allergies: No Known Drug Allergies  Anticoagulation Management History:      The patient is taking warfarin and comes in today for a routine follow up visit.  Positive risk factors for bleeding include an age of 75 years or older.  The bleeding index is 'intermediate risk'.  Positive CHADS2 values include Age > 75 years old.  The start date was 12/01/2006.  Anticoagulation responsible Delaina Fetsch: Diona Browner MD, Remi Deter.  INR POC: 2.3.  Cuvette Lot#: [id number].  Exp: 10/11.    Anticoagulation Management Assessment/Plan:      The patient's current anticoagulation dose is Warfarin sodium 5 mg tabs: Use as directed by Anticoagulation Clinic.  The target INR is 2 - 3.  The next INR is due 08/10/2010.  Anticoagulation instructions were given to patient.  Results were reviewed/authorized by Vashti Hey, RN.  He was notified by Vashti Hey RN.         Prior Anticoagulation Instructions: INR 2.1 Continue coumadin 5mg  once daily except 7.5mg  on Mondays  Current Anticoagulation Instructions: INR 2.3 Continue coumadin 5mg  once daily except 7.5mg  on Mondays

## 2010-10-29 NOTE — Assessment & Plan Note (Signed)
Summary: rov per spouse call/lg   Visit Type:  Follow-up Primary Provider:  Dr Shaune Pollack in [place]  CC:  no compliants.  History of Present Illness: Mr. Deroche is a 77 year old with a history of CAD (s/p CABG in 1996; myoview 2010 (no ischemia); CV disease (moderate), hypertension, dyslipidemia and atrial fibrillation.  He also has a hx of DJD. I last saw him in clinic in January 2011 Since seen he dneis chstp apin.  No dizziness.  No SOB.     Problems Prior to Update: 1)  Carotid Artery Stenosis  (ICD-433.10) 2)  Pvd  (ICD-443.9) 3)  Atrial Fibrillation  (ICD-427.31) 4)  Dyslipidemia  (ICD-272.4) 5)  Osteoarthritis  (ICD-715.90) 6)  Cad  (ICD-414.00)  Current Medications (verified): 1)  Amlodipine Besylate 5 Mg Tabs (Amlodipine Besylate) .Marland Kitchen.. 1 Tab Once Daily 2)  Warfarin Sodium 5 Mg Tabs (Warfarin Sodium) .... Use As Directed By Anticoagulation Clinic 3)  Aspirin 81 Mg Tbec (Aspirin) .... Take One Tablet By Mouth Daily 4)  Rapaflo 8 Mg Caps (Silodosin) .Marland Kitchen.. 1 Tab Once Daily in Place of Flomax 5)  Metoprolol Tartrate 25 Mg Tabs (Metoprolol Tartrate) .... Take One Tablet By Mouth Twice A Day 6)  Pacerone 200 Mg Tabs (Amiodarone Hcl) .... 1/2 Tablet Daily 7)  Metformin Hcl 500 Mg Tabs (Metformin Hcl) .... Take 1 Tablet By Mouth Once A Day 8)  Lyrica 50 Mg Caps (Pregabalin) .... Take 1 Tablet By Mouth Two Times A Day 9)  Multivitamn .... Take 1 Tablet By Mouth Once A Day 10)  Hydrocodone Apap 5-375 Mg .... As Needed  Allergies (verified): No Known Drug Allergies  Past History:  Past Medical History: Last updated: 05-02-2009 Current Problems:  PVD (ICD-443.9) ATRIAL FIBRILLATION (ICD-427.31) DYSLIPIDEMIA (ICD-272.4) OSTEOARTHRITIS (ICD-715.90) CAD (ICD-414.00)  Past Surgical History: Last updated: May 02, 2009  1. He has had coronary artery bypass grafting.   2. Previous back surgery.   Family History: Last updated: 2009-05-02 Mother died of coronary disease at  the age of 29.  His  father died of gastric cancer in his 63's.  Social History: Last updated: 05-02-09 He is retired.  He is a nonsmoker.  He does not drink   any alcohol.  He does not use any illicit drugs.  He lives at home with   his wife.      Review of Systems       Reviewed.  Neg  to  above problme except as noted. above.  Vital Signs:  Patient profile:   77 year old male Height:      72 inches Weight:      211 pounds BMI:     28.72 Pulse rate:   64 / minute BP sitting:   108 / 68  (left arm) Cuff size:   regular  Vitals Entered By: Micki Riley CNA (October 16, 2010 11:43 AM)  Physical Exam  Additional Exam:  Patient is in NAD HEENT:  Normocephalic, atraumatic. EOMI, PERRLA.  Neck: JVP is normal. No thyromegaly. L carotid brut. Lungs: clear to auscultation. No rales no wheezes.  Heart: Regular rate and rhythm. Normal S1, S2. No S3.   No significant murmurs. PMI not displaced.  Abdomen:  Supple, nontender. Normal bowel sounds. No masses. No hepatomegaly.  Extremities:   Good distal pulses throughout. No lower extremity edema.  Musculoskeletal :moving all extremities.  Neuro:   alert and oriented x3.    Impression & Recommendations:  Problem # 1:  CAD (ICD-414.00) No sympotms  to sugest ischemia.  Remote CABG.  Myoview in 2010.  No ischemia. Will not repeat for now.  F/U next winter.  Problem # 2:  PVD (ICD-443.9) Repeat USN  Problem # 3:  ATRIAL FIBRILLATION (ICD-427.31) Denies palpitations.  Continue amio.  Check labs.  Problem # 4:  DYSLIPIDEMIA (ICD-272.4) Check lipids.  Other Orders: Carotid Duplex (Carotid Duplex) EKG w/ Interpretation (93000)  Patient Instructions: 1)  Your physician wants you to follow-up in:12 months   You will receive a reminder letter in the mail two months in advance. If you don't receive a letter, please call our office to schedule the follow-up appointment. 2)  Your physician has requested that you have a carotid  duplex. This test is an ultrasound of the carotid arteries in your neck. It looks at blood flow through these arteries that supply the brain with blood. Allow one hour for this exam. There are no restrictions or special instructions.    in [place]

## 2010-10-29 NOTE — Medication Information (Signed)
Summary: ccr-lr  Anticoagulant Therapy  Managed by: Vashti Hey, RN PCP: Dr Shaune Pollack in [place] MD: Dietrich Pates MD, Molly Maduro Indication 1: Atrial Fibrillation (ICD-427.31) Lab Used: [facility] [place] Site: [place] INR POC 2.1  Dietary changes: no    Health status changes: no    Bleeding/hemorrhagic complications: no    Recent/future hospitalizations: no    Any changes in medication regimen? no    Recent/future dental: no  Any missed doses?: no       Is patient compliant with meds? yes       Allergies: No Known Drug Allergies  Anticoagulation Management History:      The patient is taking warfarin and comes in today for a routine follow up visit.  Positive risk factors for bleeding include an age of 75 years or older.  The bleeding index is 'intermediate risk'.  Positive CHADS2 values include Age > 75 years old.  The start date was 12/01/2006.  Anticoagulation responsible provider: Dietrich Pates MD, Molly Maduro.  INR POC: 2.1.  Cuvette Lot#: [id number].  Exp: 10/11.    Anticoagulation Management Assessment/Plan:      The patient's current anticoagulation dose is Warfarin sodium 5 mg tabs: Use as directed by Anticoagulation Clinic.  The target INR is 2 - 3.  The next INR is due 01/07/2010.  Anticoagulation instructions were given to patient.  Results were reviewed/authorized by Vashti Hey, RN.  He was notified by Vashti Hey RN.         Prior Anticoagulation Instructions: INR 2.1 Continue coumadin 5mg  once daily except 7.5mg  on Mondays  Current Anticoagulation Instructions: INR 2.1 Continue coumadin 5mg  once daily except 7.5mg  on Mondays

## 2010-10-29 NOTE — Medication Information (Signed)
Summary: ccr-lr  Anticoagulant Therapy  Managed by: Vashti Hey, RN PCP: Dr Shaune Pollack in [place] MD: Eden Emms MD, Theron Arista Indication 1: Atrial Fibrillation (ICD-427.31) Lab Used: [facility] [place] Site: [place] INR POC 1.9  Dietary changes: no    Health status changes: no    Bleeding/hemorrhagic complications: no    Recent/future hospitalizations: no    Any changes in medication regimen? no    Recent/future dental: no  Any missed doses?: no       Is patient compliant with meds? yes       Allergies: No Known Drug Allergies  Anticoagulation Management History:      The patient is taking warfarin and comes in today for a routine follow up visit.  Positive risk factors for bleeding include an age of 13 years or older.  The bleeding index is 'intermediate risk'.  Positive CHADS2 values include Age > 65 years old.  The start date was 12/01/2006.  Anticoagulation responsible provider: Eden Emms MD, Theron Arista.  INR POC: 1.9.  Cuvette Lot#: [id number].  Exp: 10/11.    Anticoagulation Management Assessment/Plan:      The patient's current anticoagulation dose is Warfarin sodium 5 mg tabs: Use as directed by Anticoagulation Clinic.  The target INR is 2 - 3.  The next INR is due 11/12/2009.  Anticoagulation instructions were given to patient.  Results were reviewed/authorized by Vashti Hey, RN.  He was notified by Vashti Hey RN.         Prior Anticoagulation Instructions: INR 2.3 Continue coumadin 5mg  once daily except 7.5mg  on Mondays  Current Anticoagulation Instructions: INR 1.9 Take coumadin 2 tablets tonight then resume 1 tablet once daily except 1 1/2 tablets on Mondays

## 2010-10-29 NOTE — Medication Information (Signed)
Summary: ccr-lr  Anticoagulant Therapy  Managed by: Vashti Hey, RN PCP: Dr Shaune Pollack in [place] MD: Dietrich Pates MD, Molly Maduro Indication 1: Atrial Fibrillation (ICD-427.31) Lab Used: [facility] [place] Site: [place] INR POC 2.3  Dietary changes: no    Health status changes: no    Bleeding/hemorrhagic complications: no    Recent/future hospitalizations: no    Any changes in medication regimen? no    Recent/future dental: no  Any missed doses?: no       Is patient compliant with meds? yes       Allergies: No Known Drug Allergies  Anticoagulation Management History:      The patient is taking warfarin and comes in today for a routine follow up visit.  Positive risk factors for bleeding include an age of 75 years or older.  The bleeding index is 'intermediate risk'.  Positive CHADS2 values include Age > 45 years old.  The start date was 12/01/2006.  Anticoagulation responsible provider: Dietrich Pates MD, Molly Maduro.  INR POC: 2.3.  Cuvette Lot#: [id number].  Exp: 10/11.    Anticoagulation Management Assessment/Plan:      The patient's current anticoagulation dose is Warfarin sodium 5 mg tabs: Use as directed by Anticoagulation Clinic.  The target INR is 2 - 3.  The next INR is due 05/25/2010.  Anticoagulation instructions were given to patient.  Results were reviewed/authorized by Vashti Hey, RN.  He was notified by Vashti Hey RN.         Prior Anticoagulation Instructions: INR 2.0 Continue coumadin 5mg  once daily except 7.5mg  on Mondays  Current Anticoagulation Instructions: INR 2.3 Continue coumadin 5mg  once daily except 7.5mg  on Mondays

## 2010-10-29 NOTE — Medication Information (Signed)
Summary: ccr-lr  Anticoagulant Therapy  Managed by: Vashti Hey, RN PCP: Dr Shaune Pollack in [place] MD: Dietrich Pates MD, Molly Maduro Indication 1: Atrial Fibrillation (ICD-427.31) Lab Used: [facility] [place] Site: [place] INR POC 2.1  Dietary changes: no    Health status changes: no    Bleeding/hemorrhagic complications: no    Recent/future hospitalizations: no    Any changes in medication regimen? no    Recent/future dental: no  Any missed doses?: no       Is patient compliant with meds? yes       Allergies: No Known Drug Allergies  Anticoagulation Management History:      The patient is taking warfarin and comes in today for a routine follow up visit.  Positive risk factors for bleeding include an age of 75 years or older.  The bleeding index is 'intermediate risk'.  Positive CHADS2 values include Age > 81 years old.  The start date was 12/01/2006.  Anticoagulation responsible provider: Dietrich Pates MD, Molly Maduro.  INR POC: 2.1.  Cuvette Lot#: [id number].  Exp: 10/11.    Anticoagulation Management Assessment/Plan:      The patient's current anticoagulation dose is Warfarin sodium 5 mg tabs: Use as directed by Anticoagulation Clinic.  The target INR is 2 - 3.  The next INR is due 07/13/2010.  Anticoagulation instructions were given to patient.  Results were reviewed/authorized by Vashti Hey, RN.  He was notified by Vashti Hey RN.         Prior Anticoagulation Instructions: INR 1.4 Was off coumadin 7 days for surgery Take coumadin 2 tablets tonight and tomorrow night then resume 5mg  once daily except 7.5mg  on Mondays  Current Anticoagulation Instructions: INR 2.1 Continue coumadin 5mg  once daily except 7.5mg  on Mondays

## 2010-11-02 ENCOUNTER — Encounter: Payer: Self-pay | Admitting: Internal Medicine

## 2010-11-04 ENCOUNTER — Other Ambulatory Visit: Payer: Self-pay | Admitting: Internal Medicine

## 2010-11-04 ENCOUNTER — Encounter: Payer: Self-pay | Admitting: Internal Medicine

## 2010-11-04 DIAGNOSIS — I6529 Occlusion and stenosis of unspecified carotid artery: Secondary | ICD-10-CM

## 2010-11-04 NOTE — Progress Notes (Signed)
Summary: pt went to [facility] for carotid-need order faxed asap  Phone Note Call from Patient   Caller: Patient Reason for Call: Talk to Nurse Summary of Call: pt was scheduled for carotid doppler-pt's wife specified it to be at [facility] and went there this am and they don't have him down said he was scheduled here they are willing to work him in but need the order faxed to fax# [phone]  Initial call taken by: Glynda Jaeger,  October 28, 2010 10:54 AM  Follow-up for Phone Call        dee calling back regarding order -[id number] Lorne Skeens  October 28, 2010 12:27 PM  Carotid Duplex order faxed to [facility] Fax# [phone]. RN spoke with  Corrie Dandy at [phone]. Follow-up by: Ollen Gross, RN, BSN,  October 28, 2010 12:33 PM

## 2010-11-04 NOTE — Medication Information (Signed)
Summary: ccr-lr  Anticoagulant Therapy  Managed by: Vashti Hey, RN PCP: Dr Shaune Pollack in [place] MD: Dietrich Pates MD, Molly Maduro Indication 1: Atrial Fibrillation (ICD-427.31) Lab Used: [facility]  Site: [place] INR POC 2.3  Dietary changes: no    Health status changes: no    Bleeding/hemorrhagic complications: no    Recent/future hospitalizations: no    Any changes in medication regimen? no    Recent/future dental: no  Any missed doses?: no       Is patient compliant with meds? yes       Allergies: No Known Drug Allergies  Anticoagulation Management History:      The patient is taking warfarin and comes in today for a routine follow up visit.  Positive risk factors for bleeding include an age of 75 years or older.  The bleeding index is 'intermediate risk'.  Positive CHADS2 values include Age > 9 years old.  The start date was 12/01/2006.  Anticoagulation responsible provider: Dietrich Pates MD, Molly Maduro.  INR POC: 2.3.  Cuvette Lot#: [id number].  Exp: 10/11.    Anticoagulation Management Assessment/Plan:      The patient's current anticoagulation dose is Warfarin sodium 5 mg tabs: Use as directed by Anticoagulation Clinic.  The target INR is 2 - 3.  The next INR is due 11/26/2010.  Anticoagulation instructions were given to patient.  Results were reviewed/authorized by Vashti Hey, RN.  He was notified by Vashti Hey RN.         Prior Anticoagulation Instructions: INR 2.4 Pt has been taking wrong dose Continue coumadin 5mg  once daily except 7.5mg  on Mondays  and Fridays  Current Anticoagulation Instructions: INR 2.3 Continue coumadin 1 tablet once daily except 1 1/2 tablets on Mondays and Fridays

## 2010-11-06 ENCOUNTER — Ambulatory Visit (HOSPITAL_COMMUNITY)
Admission: RE | Admit: 2010-11-06 | Discharge: 2010-11-06 | Disposition: A | Discharge status: Home / Self Care | Payer: Medicare Other | Source: Ambulatory Visit | Attending: Internal Medicine | Admitting: Internal Medicine

## 2010-11-06 ENCOUNTER — Telehealth: Payer: Self-pay | Admitting: Internal Medicine

## 2010-11-06 DIAGNOSIS — R209 Unspecified disturbances of skin sensation: Secondary | ICD-10-CM | POA: Insufficient documentation

## 2010-11-06 DIAGNOSIS — I6529 Occlusion and stenosis of unspecified carotid artery: Secondary | ICD-10-CM | POA: Insufficient documentation

## 2010-11-06 DIAGNOSIS — E119 Type 2 diabetes mellitus without complications: Secondary | ICD-10-CM | POA: Insufficient documentation

## 2010-11-06 MED ORDER — IOHEXOL 350 MG/ML SOLN
100.0000 mL | Freq: Once | INTRAVENOUS | Status: AC | PRN
  Administered 2010-11-06: 100 mL via INTRAVENOUS

## 2010-11-12 NOTE — Miscellaneous (Signed)
  Clinical Lists Changes  Orders: Added new Referral order of CT Scan  (CT Scan) - Signed 

## 2010-11-12 NOTE — Progress Notes (Addendum)
Summary: NEED ORDER FOR PT TO HAVE A CT TODAY AT 10:00  Phone Note Other Incoming   Caller: [phone] Summary of Call: NEED ORDER FOR PT TO HAVE A CT SCAN FAX [phone] PT APPT AT 10:00 THIS MORNING Initial call taken by: Judie Grieve,  November 06, 2010 8:16 AM  Follow-up for Phone Call        done  Follow-up by: Charolotte Capuchin, RN,  November 06, 2010 8:27 AM

## 2010-11-26 ENCOUNTER — Encounter: Payer: Self-pay | Admitting: Cardiology

## 2010-11-26 ENCOUNTER — Encounter (INDEPENDENT_AMBULATORY_CARE_PROVIDER_SITE_OTHER): Payer: Medicare Other

## 2010-11-26 DIAGNOSIS — Z7901 Long term (current) use of anticoagulants: Secondary | ICD-10-CM

## 2010-11-26 DIAGNOSIS — I4891 Unspecified atrial fibrillation: Secondary | ICD-10-CM

## 2010-11-26 LAB — CONVERTED CEMR LAB: POC INR: 2.4

## 2010-12-02 ENCOUNTER — Encounter (INDEPENDENT_AMBULATORY_CARE_PROVIDER_SITE_OTHER): Payer: Medicare Other | Admitting: Vascular Surgery

## 2010-12-02 DIAGNOSIS — I6529 Occlusion and stenosis of unspecified carotid artery: Secondary | ICD-10-CM

## 2010-12-03 ENCOUNTER — Encounter: Payer: Self-pay | Admitting: Internal Medicine

## 2010-12-03 LAB — CONVERTED CEMR LAB
AST: 23 units/L (ref 0–37)
Alkaline Phosphatase: 70 units/L (ref 39–117)
BUN: 19 mg/dL (ref 6–23)
Bilirubin, Direct: 0.1 mg/dL (ref 0.0–0.3)
CO2: 24 meq/L (ref 19–32)
Calcium: 8.9 mg/dL (ref 8.4–10.5)
Cholesterol: 175 mg/dL (ref 0–200)
Creatinine, Ser: 1.17 mg/dL (ref 0.40–1.50)
Glucose, Bld: 143 mg/dL — ABNORMAL HIGH (ref 70–99)
Indirect Bilirubin: 0.5 mg/dL (ref 0.0–0.9)
TSH: 3.7 microintl units/mL (ref 0.350–4.500)
Total Bilirubin: 0.6 mg/dL (ref 0.3–1.2)
Total CHOL/HDL Ratio: 4.1

## 2010-12-03 NOTE — Consult Note (Signed)
NEW PATIENT CONSULTATION  [name], [name] DOB:  [birth year]                                       12/02/2010 ZOXWR#:[id number]  I saw the patient in the office today in consultation concerning his carotid disease.  He was referred by Dr. Tenny Craw.  This is a pleasant 77- year-old right-handed gentleman who had a routine carotid duplex which suggested a 60% to 79% right carotid stenosis.  This was done at [facility].  He was sent for a vascular consultation.  Of note, he denies any previous history of stroke, TIAs, expressive or receptive aphasia or amaurosis fugax.  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  PAST MEDICAL HISTORY:  Significant for borderline diabetes, hypertension, and coronary artery disease.  He had a myocardial infarction in 1996 and underwent coronary revascularization at that time.  He also has a history of mild COPD and also some chronic back problems.  SOCIAL HISTORY:  He is married.  He has two children.  He quit tobacco in 1992.  FAMILY HISTORY:  He had a brother who had open heart surgery at age 75 and also a sister who required open heart surgery prior to age 84.  He is unaware of any other history of premature cardiovascular disease.  REVIEW OF SYSTEMS:  GENERAL:  He has had no recent weight loss, weight gain or problems with his appetite. CARDIOVASCULAR:  He has had no chest pain, chest pressure, palpitations or arrhythmias.  He does admit to dyspnea on exertion.  He has a history of atrial fibrillation.  He does admit to some claudication in both legs with no history of rest pain.  He has had no history of DVT or phlebitis. ENT:  He has had some loss of hearing recently. MUSCULOSKELETAL:  He does have some arthritis, joint pain and muscle pain. GI, neurologic, pulmonary, hematologic, psychiatric, integumentary review of systems is unremarkable  and is documented on the medical history form in his chart.  PHYSICAL EXAMINATION:  This is a pleasant 77 year old gentleman who appears his stated age.  His blood pressure is 167/71, heart rate is 68, respiratory rate is 12.  HEENT:  Unremarkable.  Lungs:  Clear bilaterally to auscultation without rales, rhonchi or wheezing.  On cardiovascular exam he has soft bilateral carotid bruits.  He has a regular rate and rhythm.  He has palpable femoral pulses.  I cannot palpate pedal pulses on either side.  He has no significant lower extremity swelling.  Abdomen:  Soft and nontender with normal-pitched bowel sounds.  Musculoskeletal exam:  There are no major deformities or cyanosis.  Neurologic:  He has no focal weakness or paresthesias.  Skin: There are no ulcers or rashes.  I did review his carotid duplex scan, which was done at [facility].  By velocity criteria he has a 60%-79% right carotid stenosis and a less than 39% left carotid stenosis.  Both vertebral arteries are patent with normally-directed flow.  I have also reviewed his CT scan, which was done at [facility], which showed evidence of an 80% proximal right internal carotid artery stenosis, no significant stenosis on the left.  I have also reviewed the records from Dr. Charlott Rakes office.  He is on chronic Coumadin therapy for his AFib.  He has had  no recent symptoms related to his coronary disease.  I think I would put more faith in the results of his carotid duplex scan over a CT scan with respect to defining the severity of the right carotid stenosis, and I think this is clearly less than 80%.  As he is asymptomatic, I would not consider carotid endarterectomy on the right unless the stenosis progressed to greater than 80%.  I have encouraged him to continue taking his aspirin.  I have ordered a follow-up carotid duplex scan in 6 months and I will see him back at that time.  He knows to call sooner if he has  problems.    Di Kindle. Edilia Bo, M.D. Electronically Signed  CSD/MEDQ  D:  12/02/2010  T:  12/03/2010  Job:  [id number]  cc:   Pricilla Riffle, MD, [facility] [place] Deputy. Juanetta Gosling, M.D.

## 2010-12-03 NOTE — Medication Information (Signed)
Summary: ccr-lr  Anticoagulant Therapy  Managed by: Vashti Hey, RN PCP: Dr Shaune Pollack in [place] MD: Diona Browner MD, Remi Deter Indication 1: Atrial Fibrillation (ICD-427.31) Lab Used: [facility] [place] Site: [place] INR POC 2.4  Dietary changes: no    Health status changes: no    Bleeding/hemorrhagic complications: no    Recent/future hospitalizations: no    Any changes in medication regimen? no    Recent/future dental: no  Any missed doses?: no       Is patient compliant with meds? yes       Allergies: No Known Drug Allergies  Anticoagulation Management History:      The patient is taking warfarin and comes in today for a routine follow up visit.  Positive risk factors for bleeding include an age of 41 years or older.  The bleeding index is 'intermediate risk'.  Positive CHADS2 values include Age > 4 years old.  The start date was 12/01/2006.  Anticoagulation responsible provider: Diona Browner MD, Remi Deter.  INR POC: 2.4.  Cuvette Lot#: [id number].  Exp: 10/11.    Anticoagulation Management Assessment/Plan:      The patient's current anticoagulation dose is Warfarin sodium 5 mg tabs: Use as directed by Anticoagulation Clinic.  The target INR is 2 - 3.  The next INR is due 12/24/2010.  Anticoagulation instructions were given to patient.  Results were reviewed/authorized by Vashti Hey, RN.  He was notified by Vashti Hey RN.         Prior Anticoagulation Instructions: INR 2.3 Continue coumadin 1 tablet once daily except 1 1/2 tablets on Mondays and Fridays  Current Anticoagulation Instructions: INR 2.4 Continue coumadin 5mg  once daily except 7.5mg  on Mondays and Fridays

## 2010-12-08 NOTE — Miscellaneous (Addendum)
  Clinical Lists Changes  Medications: Added new medication of ZETIA 10 MG TABS (EZETIMIBE) 1 tab every evening - Signed Rx of ZETIA 10 MG TABS (EZETIMIBE) 1 tab every evening;  #30 x 3;  Signed;  Entered by: Layne Benton, RN, BSN;  Authorized by: Sherrill Raring, MD, [facility];  Method used: Faxed to [facility] [address], [address] [place], [place]  [address]  [place], Ph: [phone], Fax: [phone]    Prescriptions: ZETIA 10 MG TABS (EZETIMIBE) 1 tab every evening  #30 x 3   Entered by:   Layne Benton, RN, BSN   Authorized by:   Sherrill Raring, MD, [facility]   Signed by:   Layne Benton, RN, BSN on 12/03/2010   Method used:   Faxed to ...       [facility] [address] (retail)       [address]       [place], [place]  [address]  [place]       Ph: [phone]       Fax: [phone]   RxID:   [id number]

## 2010-12-10 LAB — SURGICAL PCR SCREEN
MRSA, PCR: NEGATIVE
Staphylococcus aureus: NEGATIVE

## 2010-12-10 LAB — GLUCOSE, CAPILLARY
Glucose-Capillary: 116 mg/dL — ABNORMAL HIGH (ref 70–99)
Glucose-Capillary: 131 mg/dL — ABNORMAL HIGH (ref 70–99)
Glucose-Capillary: 134 mg/dL — ABNORMAL HIGH (ref 70–99)
Glucose-Capillary: 139 mg/dL — ABNORMAL HIGH (ref 70–99)
Glucose-Capillary: 141 mg/dL — ABNORMAL HIGH (ref 70–99)
Glucose-Capillary: 141 mg/dL — ABNORMAL HIGH (ref 70–99)
Glucose-Capillary: 146 mg/dL — ABNORMAL HIGH (ref 70–99)
Glucose-Capillary: 146 mg/dL — ABNORMAL HIGH (ref 70–99)
Glucose-Capillary: 151 mg/dL — ABNORMAL HIGH (ref 70–99)
Glucose-Capillary: 165 mg/dL — ABNORMAL HIGH (ref 70–99)

## 2010-12-10 LAB — DIFFERENTIAL
Basophils Relative: 1 % (ref 0–1)
Lymphs Abs: 1.7 10*3/uL (ref 0.7–4.0)
Monocytes Absolute: 0.5 10*3/uL (ref 0.1–1.0)
Monocytes Relative: 8 % (ref 3–12)
Neutro Abs: 4.2 10*3/uL (ref 1.7–7.7)

## 2010-12-10 LAB — ANAEROBIC CULTURE

## 2010-12-10 LAB — COMPREHENSIVE METABOLIC PANEL
ALT: 19 U/L (ref 0–53)
Albumin: 4.1 g/dL (ref 3.5–5.2)
Alkaline Phosphatase: 68 U/L (ref 39–117)
BUN: 11 mg/dL (ref 6–23)
BUN: 18 mg/dL (ref 6–23)
CO2: 27 mEq/L (ref 19–32)
Calcium: 8.5 mg/dL (ref 8.4–10.5)
Chloride: 103 mEq/L (ref 96–112)
Creatinine, Ser: 1.27 mg/dL (ref 0.4–1.5)
GFR calc non Af Amer: 55 mL/min — ABNORMAL LOW (ref >60)
Glucose, Bld: 151 mg/dL — ABNORMAL HIGH (ref 70–99)
Potassium: 4 mEq/L (ref 3.5–5.1)
Sodium: 140 mEq/L (ref 135–145)
Total Bilirubin: 1.5 mg/dL — ABNORMAL HIGH (ref 0.3–1.2)
Total Protein: 7.2 g/dL (ref 6.0–8.3)

## 2010-12-10 LAB — APTT
aPTT: 32 seconds (ref 24–37)
aPTT: 43 seconds — ABNORMAL HIGH (ref 24–37)

## 2010-12-10 LAB — PROTIME-INR
INR: 1.09 (ref 0.00–1.49)
INR: 1.15 (ref 0.00–1.49)
INR: 1.18 (ref 0.00–1.49)
INR: 1.24 (ref 0.00–1.49)
Prothrombin Time: 14.9 seconds (ref 11.6–15.2)
Prothrombin Time: 15.2 seconds (ref 11.6–15.2)
Prothrombin Time: 15.8 seconds — ABNORMAL HIGH (ref 11.6–15.2)

## 2010-12-10 LAB — BODY FLUID CULTURE

## 2010-12-10 LAB — CBC
Hemoglobin: 12.6 g/dL — ABNORMAL LOW (ref 13.0–17.0)
MCH: 29.8 pg (ref 26.0–34.0)
MCHC: 34.1 g/dL (ref 30.0–36.0)
MCV: 87.4 fL (ref 78.0–100.0)
Platelets: 152 10*3/uL (ref 150–400)
RBC: 4.21 MIL/uL — ABNORMAL LOW (ref 4.22–5.81)
RDW: 16.9 % — ABNORMAL HIGH (ref 11.5–15.5)
WBC: 7.1 10*3/uL (ref 4.0–10.5)

## 2010-12-22 ENCOUNTER — Encounter: Payer: Self-pay | Admitting: Internal Medicine

## 2010-12-22 DIAGNOSIS — I4891 Unspecified atrial fibrillation: Secondary | ICD-10-CM

## 2010-12-22 DIAGNOSIS — Z7901 Long term (current) use of anticoagulants: Secondary | ICD-10-CM

## 2010-12-24 ENCOUNTER — Ambulatory Visit (INDEPENDENT_AMBULATORY_CARE_PROVIDER_SITE_OTHER): Payer: Medicare Other | Admitting: *Deleted

## 2010-12-24 DIAGNOSIS — I4891 Unspecified atrial fibrillation: Secondary | ICD-10-CM

## 2010-12-24 DIAGNOSIS — Z7901 Long term (current) use of anticoagulants: Secondary | ICD-10-CM

## 2010-12-24 LAB — POCT INR: INR: 2.5

## 2011-01-14 ENCOUNTER — Other Ambulatory Visit (HOSPITAL_COMMUNITY): Payer: Self-pay | Admitting: Pulmonary Disease

## 2011-01-14 DIAGNOSIS — R221 Localized swelling, mass and lump, neck: Secondary | ICD-10-CM

## 2011-01-15 ENCOUNTER — Other Ambulatory Visit (HOSPITAL_COMMUNITY): Payer: Self-pay | Admitting: Pulmonary Disease

## 2011-01-18 ENCOUNTER — Ambulatory Visit (HOSPITAL_COMMUNITY)
Admission: RE | Admit: 2011-01-18 | Discharge: 2011-01-18 | Disposition: A | Discharge status: Home / Self Care | Payer: Medicare Other | Source: Ambulatory Visit | Attending: Pulmonary Disease | Admitting: Pulmonary Disease

## 2011-01-18 ENCOUNTER — Encounter (HOSPITAL_COMMUNITY): Payer: Self-pay

## 2011-01-18 DIAGNOSIS — R220 Localized swelling, mass and lump, head: Secondary | ICD-10-CM | POA: Insufficient documentation

## 2011-01-18 DIAGNOSIS — R221 Localized swelling, mass and lump, neck: Secondary | ICD-10-CM | POA: Insufficient documentation

## 2011-01-18 DIAGNOSIS — R599 Enlarged lymph nodes, unspecified: Secondary | ICD-10-CM | POA: Insufficient documentation

## 2011-01-18 HISTORY — DX: Essential (primary) hypertension: I10

## 2011-01-18 MED ORDER — IOHEXOL 300 MG/ML  SOLN
75.0000 mL | Freq: Once | INTRAMUSCULAR | Status: AC | PRN
  Administered 2011-01-18: 75 mL via INTRAVENOUS

## 2011-01-21 ENCOUNTER — Ambulatory Visit (INDEPENDENT_AMBULATORY_CARE_PROVIDER_SITE_OTHER): Payer: Medicare Other | Admitting: *Deleted

## 2011-01-21 DIAGNOSIS — I4891 Unspecified atrial fibrillation: Secondary | ICD-10-CM

## 2011-01-21 DIAGNOSIS — Z7901 Long term (current) use of anticoagulants: Secondary | ICD-10-CM

## 2011-02-09 NOTE — Group Therapy Note (Signed)
NAME:  [name], [name]             ACCOUNT NO.:  [id number]   MEDICAL RECORD NO.:  [id number]          PATIENT TYPE:  OBV   LOCATION:  A336                          FACILITY:  [facility]   PHYSICIAN:  Edward L. Juanetta Gosling, M.D.DATE OF BIRTH:  22-May-1932   DATE OF PROCEDURE:  DATE OF DISCHARGE:                                 PROGRESS NOTE   SUBJECTIVE:  [name] was admitted with back pain and says he is  about 50% better, but he is not able to get out of bed without a lot of  assistants.  He has no other new complaints.   OBJECTIVE:  Physical examination shows a temperature 96.7, pulse 64,  respiration 20, and blood pressure 148/69.  His chest is clear.  His  back is better.  His heart is regular.   ASSESSMENT:  He seems to be better.   PLAN:  He is to continue with his treatment, medications and follow.      Edward L. Juanetta Gosling, M.D.  Electronically Signed     ELH/MEDQ  D:  04/19/2008  T:  04/19/2008  Job:  [id number]

## 2011-02-09 NOTE — Assessment & Plan Note (Signed)
[facility]                       [place] CARDIOLOGY OFFICE NOTE   TZION, WEDEL                    MRN:          [id number]  DATE:01/19/2008                            DOB:          August 16, 1932    IDENTIFICATION:  [name] is a 77 year old gentleman (soon to be 82).  He has a history of CAD (CABG in 1996.)  Last Myoview was in December  2008 (bike exercise).  This showed no inducible ischemia.  LVEF on  gating was 46%.  The patient also has a history of hypertension,  dyslipidemia, peripheral vascular disease and atrial fibrillation.   In the interval since seen, he has done fairly well.  He denies chest  pain or shortness of breath.   CURRENT MEDICATIONS:  1. Aspirin 81.  2. Etodolac 400 b.i.d.  3. Multivitamin.  4. Metoprolol 25 b.i.d.  5. Amiodarone 100 daily.  6. Coumadin as directed.  7. Flomax.   PHYSICAL EXAMINATION:  VITAL SIGNS:  Blood pressure is 161/76, pulse is  63, weight 209, up from 196 on last visit.  LUNGS:  Clear without rales.  CARDIAC:  Regular rate and rhythm, S1-S2, no S3.  NECK:  Right carotid bruit.  ABDOMEN:  Benign.  EXTREMITIES:  No edema.   IMPRESSION:  1. Coronary artery disease, remote bypass.  Myoview looks good from      December.  Would continue on current therapy.  2. Atrial fibrillation.  Continue on amiodarone.  EKG today shows      sinus bradycardia at 56 beats per minute.  Will have follow up labs      done in the next several weeks.  3. Hypertension, should be better.  I have added Norvasc 2.5 then 5 to      his regimen.  I would like to see him back in a couple of months.  4. Health care maintenance.  The patient has vascular disease.  His      LDL was 93.  I would like to start him on Zocor and will follow up      in 2 months.  5. Cerebrovascular disease.  Due for follow up carotids this summer.   FOLLOW UP:  Again, I will follow up in a couple of months.    Pricilla Riffle, MD,  [facility]  Electronically Signed   PVR/MedQ  DD: 01/19/2008  DT: 01/19/2008  Job #: [id number]

## 2011-02-09 NOTE — Assessment & Plan Note (Signed)
[facility]                       [name] CARDIOLOGY OFFICE NOTE   [name], [name]                    MRN:          [id number]  DATE:07/10/2007                            DOB:          02/13/1932    IDENTIFICATION:  Patient is a 77 year old gentleman with a history of  CAD (CABG in 1996, Cardiolite 2002), hypertension, dyslipidemia, and  atrial fibrillation.  I last saw him in June.   Since seen, he does say he gives out some, maybe a little more than the  last time, it is hard to say.  No chest pain.  No palpitations.   CURRENT MEDICATIONS:  Include:  1. Metoprolol 25 b.i.d.  2. Coumadin as directed.  3. Aspirin 81.  4. Flomax 0.4.  5. Amiodarone 100.   PHYSICAL EXAMINATION:  Patient is in no distress.  Blood pressure 154/70 (the patient and his wife say at home it has been  running better in the 120s to 130 range).  Pulse is 62.  Weight 196, up  from 180 in June.  LUNGS:  Clear.  CARDIAC EXAM:  Regular rate and rhythm.  S1 and S2.  No S3.  NECK:  Right carotid bruit.  ABDOMEN:  Benign.  EXTREMITIES:  No edema.  INR today is 1.1.   IMPRESSION:  1. Coronary artery disease.  Remote coronary artery bypass graft.  He      did not tolerate adenosine, and with this gait, he cannot walk.  I      will see if we can do a bike test.  He says he can bike well.      Again, I do not want to put him through amiodarone if possible.  He      is at the point now of being 12 years post bypass that really      should have periodic screening.  We will be in touch with them, but      will not schedule yet.  2. Atrial fibrillation.  Remains on amiodarone and Coumadin.  We will      need to periodically follow labs.  Will need to go up on his      Coumadin.  Again, his wife says he is puttering around more.  3. Hypertension.  He says it is better at home.  I will follow for      now.  4. Dyslipidemia.  He needs a fasting lipid panel, especially with  his      cerebral vascular disease.  We will be in touch with him once I      have seen the results.  5. Cardiovascular disease.  Carotid study this summer showed a 50% to      69% stenoses.  We will follow up in the spring.     Pricilla Riffle, MD, [facility]  Electronically Signed    PVR/MedQ  DD: 07/10/2007  DT: 07/11/2007  Job #: [id number]   cc:   Ramon Dredge L. Juanetta Gosling, M.D.

## 2011-02-09 NOTE — Assessment & Plan Note (Signed)
[facility]                            CARDIOLOGY OFFICE NOTE   STEPFON, RAWLES                    MRN:          [id number]  DATE:08/30/2008                            DOB:          13-Nov-1931    IDENTIFICATION:  Mr. Shall is a 77 year old gentleman, who I have  followed in our [facility].  I last saw him in June.  He has a  history of coronary artery disease, status post CABG in 1996.  Last  Myoview scan in December of last year showed no inducible ischemia.  LVEF of 46%.  Also, the patient has a history of hypertension,  osteoarthritis, dyslipidemia, atrial fibrillation, and peripheral  vascular disease.   Since then, the patient has been doing better.  He was hospitalized with  severe muscle spasms this summer.  He has now seen a chiropractor and  has had some improvement in his pain in mobility from his back.   He denies chest pain.  No shortness of breath.   CURRENT MEDICINES:  1. Aspirin 81.  2. Multivitamin.  3. Metoprolol 25 b.i.d.  4. Amiodarone 100 daily.  5. Coumadin as directed.  6. Flomax 0.4.  7. Amlodipine 2.5.   PHYSICAL EXAMINATION:  GENERAL:  The patient is in no distress.  VITAL SIGNS:  Blood pressure is 143/73, pulse 76, and weight 212.  NECK:  No audible bruits.  LUNGS:  Clear.  CARDIAC:  Regular rate and rhythm.  S1 and S2.  No S3.  No significant  murmurs.  ABDOMEN:  Obese, benign.  Reducible midline hernia.  No hepatomegaly.  EXTREMITIES:  No edema.   A 12-lead EKG shows motion artifact, probable normal sinus rhythm, 75  beats per minute.   IMPRESSION:  1. Coronary artery disease, clinically stable.  I would continue to      follow him on his medical regimen.  2. Atrial fibrillation.  Continue on amiodarone and Coumadin.  We will      check his LFTs and thyroid.  Note, a chest x-ray this summer was      negative.  3. Hypertension, not clear.  Actually, if he is taking Norvasc 2.5 or      5, I  would keep him on what he is on. 4.  Dyslipidemia.  He did not      tolerate simvastatin secondary to achiness.  I have given him a      prescription for Pravachol to try 10 mg after the holidays, if he      tolerates, we will increase to 20 and follow up with lipids.  4. History of cerebrovascular disease.  Last carotid scan showed some      progression in the right 70-79%.  The patient is asymptomatic.  I      will make sure that he has followup there.   Otherwise, I will set to see the patient back this summer, sooner if  problems develop.     Pricilla Riffle, MD, [facility]  Electronically Signed    PVR/MedQ  DD: 08/30/2008  DT: 08/31/2008  Job #: [id number]

## 2011-02-09 NOTE — Assessment & Plan Note (Signed)
[facility]                       [place] CARDIOLOGY OFFICE NOTE   ZENO, HICKEL                    MRN:          [id number]  DATE:03/15/2008                            DOB:          01-15-1932    IDENTIFICATION:  Mr. Dirosa is a 77 year old gentleman with a history  of CAD (CABG in 1996; Myoview scan in December 2008, no inducible  ischemia, LVEF of 46%).  Also, has a history of hypertension,  dyslipidemia, atrial fibrillation, and peripheral vascular disease.   Since seen, he has been doing okay.  No change in his breathing.  No  chest pressure.  He does complain of his abdominal hernia, which he  thinks is getting bigger.  His blood pressure when last checked at home  was 126/66.   CURRENT MEDICATIONS:  1. Aspirin 81.  2. Etodolac 400 b.i.d.  3. Multivitamin.  4. Metoprolol 25 b.i.d.  5. Amiodarone 100 daily.  6. Coumadin as directed.  7. Flomax 0.4 daily.  8. Simvastatin 20.  9. Amlodipine 2.5.   PHYSICAL EXAMINATION:  GENERAL:  On arrival, the patient is in no  distress.  VITAL SIGNS:  Blood pressure is 157/71, pulse is 61, and weight 212.  LUNGS:  Clear, occasional rhonchi.  CARDIAC:  Regular rate and rhythm, S1 and S2.  No S3.  No significant  murmurs.  ABDOMEN:  Benign and obese.  There is a reducible midline hernia.  EXTREMITIES:  No edema.   INR today is 1.4, being adjusted.  Chest x-ray shows bibasilar  atelectasis.  Carotid scan shows evidence of progression on the right  ICA at 70%-79%.  Left ICA without significant narrowing.   IMPRESSION:  1. Coronary artery disease appears to be stable.  We would continue.  2. Atrial fibrillation, continue on amiodarone and Coumadin.  Labs are      pending.  3. Hypertension.  We would increase Norvasc to 5.  4. Dyslipidemia.  Set up for fasting lipids today.  5. Cerebrovascular disease, asymptomatic and was discussed, thus would      continue on medical therapy for  now.   I will set to see the patient back in 6 months in [place], followup  with labs.     Pricilla Riffle, MD, [facility]  Electronically Signed    PVR/MedQ  DD: 03/15/2008  DT: 03/16/2008  Job #: [id number]   cc:   Dr. Juanetta Gosling

## 2011-02-09 NOTE — Assessment & Plan Note (Signed)
[facility]                       [place] CARDIOLOGY OFFICE NOTE   AMANUEL, SINKFIELD                    MRN:          [id number]  DATE:03/09/2007                            DOB:          1932-01-05    IDENTIFICATION:  [name] is a 77 year old gentleman whom I saw back  in February. Prior to that he had been in clinic in 2004.   The patient was hospitalized in the fall for back surgery. While in  rehab, developed atrial fibrillation, resolved and came back. He ended  up on amiodarone and Coumadin.   Since seen in February, he has been doing fairly well. Still going  through rehab for his back. He denies palpitations. No significant  shortness of breath. Two months ago, he fell and hit his elbow. His  walker got stuck. His wife says though he is not unsteady now. She does  not consider him a fall risk.   CURRENT MEDICATIONS:  1. Flomax 0.4.  2. Aspirin 81 mg daily.  3. Metoprolol 25 b.i.d.  4. Coumadin as directed.  5. Diazepam 2.5 b.i.d.  6. Ciprofloxacin 25 b.i.d.  7. Cymbalta 30 daily.  8. Amiodarone 200 daily.   PHYSICAL EXAMINATION:  The patient is in no distress. Blood pressure is  120/70, pulse 60.  NECK: JVP is normal. Right carotid bruit.  LUNGS:  Clear.  CARDIAC: Regular rate and rhythm, S1, S2. No S3, distant.  ABDOMEN: Benign.  EXTREMITIES: No edema.   A 12-lead EKG normal sinus rhythm at 60 beats per minute, PR interval  normal, QT prolonged at 0.512.   IMPRESSION:  1. Atrial fibrillation, staying in sinus rhythm. I will get LFTs, TSH      and amiodarone level. Consider backing down to 100 daily. The      patient should remain on Coumadin and I spoke to his wife about      this.  2. Coronary artery disease. Status post coronary artery bypass graft      in 1996. Cardiolite in 2002, negative for ischemia. I have not      scheduled another Cardiolite, will review in the winter.      Symptomatically doing okay.  3. Hypertension, adequate control.  4. Dyslipidemia. No longer on an agent. Will need to followup with      this as I have spent more time discussing his atrial fibrillation.   Will be in touch with the patient's wife.   I will set to see him back in four months.     Pricilla Riffle, MD, [facility]  Electronically Signed    PVR/MedQ  DD: 03/09/2007  DT: 03/09/2007  Job #: [id number]   cc:   Ramon Dredge L. Juanetta Gosling, M.D.

## 2011-02-09 NOTE — H&P (Signed)
NAME:  [name], [name]             ACCOUNT NO.:  [id number]   MEDICAL RECORD NO.:  [id number]          PATIENT TYPE:  OBV   LOCATION:  A336                          FACILITY:  [facility]   PHYSICIAN:  Edward L. Juanetta Gosling, M.D.DATE OF BIRTH:  Feb 16, 1932   DATE OF ADMISSION:  04/18/2008  DATE OF DISCHARGE:  LH                              HISTORY & PHYSICAL   REASON FOR ADMISSION:  Back pain.   HISTORY:  Mr. Code has come to the emergency room because of  increasing back pain.  He has a long known history of problems with his  back.  In fact, he has had back surgeries.  Then, the ended up with an  infection in that area and osteomyelitis.  He is now having increasing  problems.  He said that he had bent over to tie his shoe.  It seemed to  start with some difficulty with pain, and he was unable to get relief in  the emergency room, so he was brought into the hospital.   PAST MEDICAL HISTORY:  1. Coronary artery occlusive disease.  2. Hypertension.  3. Arthritis.  4. Hypothyroidism.  5. BPH.   SURGICAL HISTORY:  1. He has had coronary artery bypass grafting.  2. Previous back surgery.   SOCIAL HISTORY:  He is retired.  He is a nonsmoker.  He does not drink  any alcohol.  He does not use any illicit drugs.  He lives at home with  his wife.   He has no known drug allergies.   MEDICATIONS:  1. Metoprolol 25 mg b.i.d.  2. Lodine 400 mg b.i.d.  3. Aspirin 81 mg daily.  4. Diazepam 5 mg b.i.d.  5. Amlodipine 5 mg daily.  6. Coumadin 5 mg daily.  7. Flomax 0.4 daily.  8. Amiodarone 200 mg daily.   FAMILY HISTORY:  Negative for any other severe problems with the back.   REVIEW OF SYSTEMS:  He has not had any loss of continence of bowel or  bladder.  He has not had any numbness.   PHYSICAL EXAMINATION:  GENERAL:  Shows a well-developed, well-nourished  male who appears to be in no acute distress now.  VITAL SIGNS:  Blood pressure 122/70, pulse is 70 and regular, his  respirations are 16.  He is afebrile.  HEENT:  His pupils are reactive.  His nose and throat are clear.  NECK:  Supple, without masses.  CHEST:  Clear, without wheezes, rales, or rhonchi.  HEART:  Regular, without murmur, gallop, or rub.  ABDOMEN:  Soft.  No masses felt.  EXTREMITIES:  Showed no edema.  CNS:  Grossly intact.   CT of the lumbar spine shows continued collapse of L3-L4 space.  It is  felt to mostly represent scarring from his previous surgery.  No active  disease.   ASSESSMENT:  I think he has probably had a muscle strain or spasm that  has caused him to have problems.  I plan to admit him for IV pain  medication, IV steroids.  He is going to be on muscle relaxants.  Will  continue his regular  medications and reevaluate in the morning.  I have  asked for PT consultation.  He says that he has been more active  recently, and I think that may be related to his pain..  I do not plan  any new treatments today.      Edward L. Juanetta Gosling, M.D.  Electronically Signed     ELH/MEDQ  D:  04/18/2008  T:  04/19/2008  Job:  [id number]

## 2011-02-09 NOTE — Group Therapy Note (Signed)
NAME:  ANUAR, WALGREN             ACCOUNT NO.:  [id number]   MEDICAL RECORD NO.:  [id number]          PATIENT TYPE:  OBV   LOCATION:  A336                          FACILITY:  [facility]   PHYSICIAN:  Edward L. Juanetta Gosling, M.D.DATE OF BIRTH:  [birth year]   DATE OF PROCEDURE:  DATE OF DISCHARGE:  04/21/2008                                 PROGRESS NOTE   PROBLEMS:  Back pain, history of MI, and hypertension.   SUBJECTIVE:  Mr. Cromartie says he is much better.  He is able to rise on  his first attempt today and he wants to go home.  He has no new  complaints.   His physical examination shows his temperature is 97.7, pulse 65,  respirations 20, blood pressure 147/68, O2 sats 94% on room air.  He is  much less tender in his back, and I do think he is ready for discharge  home.  Please see discharge summary for details.      Edward L. Juanetta Gosling, M.D.  Electronically Signed     ELH/MEDQ  D:  04/21/2008  T:  04/22/2008  Job:  [id number]

## 2011-02-09 NOTE — Group Therapy Note (Signed)
NAME:  JOSTEN, WARMUTH             ACCOUNT NO.:  [id number]   MEDICAL RECORD NO.:  [id number]          PATIENT TYPE:  OBV   LOCATION:  A336                          FACILITY:  [facility]   PHYSICIAN:  Edward L. Juanetta Gosling, M.D.DATE OF BIRTH:  [birth year]   DATE OF PROCEDURE:  DATE OF DISCHARGE:                                 PROGRESS NOTE   Mr. Gunn is overall better.  He still has difficulty with getting up  from a chair and it takes him 4-5 efforts before he can get up.  He  still having significant back pain.   His exam today shows his temperature is 98.5, pulse 68, respirations 20,  and blood pressure 146/68.  His chest is clear.  His heart is regular.  His abdomen is soft.   ADDENDUM:  He has not had any chest discomfort like his cardiac pain.   My plan then is to continue with his meds and treatments, and see if we  can get him a little better.  I do not think he is ready to go home as  yet.  He is still receiving IV pain medication, steroids, and he has  been doing PT.      Edward L. Juanetta Gosling, M.D.  Electronically Signed     ELH/MEDQ  D:  04/20/2008  T:  04/20/2008  Job:  [id number]

## 2011-02-09 NOTE — Discharge Summary (Signed)
NAME:  [name], [name]             ACCOUNT NO.:  [id number]   MEDICAL RECORD NO.:  [id number]          PATIENT TYPE:  OBV   LOCATION:  A336                          FACILITY:  [facility]   PHYSICIAN:  Edward L. Juanetta Gosling, M.D.DATE OF BIRTH:  Oct 15, 1931   DATE OF ADMISSION:  04/18/2008  DATE OF DISCHARGE:  07/26/2009LH                               DISCHARGE SUMMARY   FINAL DISCHARGE DIAGNOSES:  1. Severe back pain.  2. Coronary artery occlusive disease.  3. Hypertension.  4. Arthritis.  5. Hypothyroidism.  6. Benign prostatic hypertrophy.  7. History of previous back surgeries and an episode of osteomyelitis      following the latest.   [name] is a 77 year old who came to the emergency room because of  increasing back pain.  He has had a long known history of problems of  his back and has had back surgeries in the last 20.  He ended up with an  episode of osteomyelitis, ended up having to be in a skilled care  facility, and had a great deal of difficulty.  He has had increasing  back pain for the last several days.  He bent over to tie a shoe this  morning it got much worse.   PHYSICAL EXAMINATION:  On admission, showed a well-developed, well-  nourished male in no acute distress.  Central nervous system was grossly  intact.  His back showed that he was tender with fairly marked muscle  spasm bilaterally.  CT showed collapse of L3-L4 space.  It was felt to  represent scarring from his previous surgery.   HOSPITAL COURSE:  He was started on IV steroids, continued on his  previous medications, given Flexeril, and given pain medications with  marked improvement.  By the time of discharge, he was much improved and  he was discharged home on metoprolol 25 mg b.i.d., Lodine 400 mg b.i.d.,  aspirin 81 mg daily, diazepam 5 mg daily, amlodipine 5 mg daily,  Coumadin 5 mg daily, Flomax 0.4 daily, amiodarone 100 mg daily, Flexeril  10 mg t.i.d., and Percocet 5/325 one 4 times a day as  needed for pain,  and Medrol Dosepak.      Edward L. Juanetta Gosling, M.D.  Electronically Signed     ELH/MEDQ  D:  04/21/2008  T:  04/21/2008  Job:  [id number]

## 2011-02-12 NOTE — H&P (Signed)
[name], [name]             ACCOUNT NO.:  [id number]   MEDICAL RECORD NO.:  [id number]          PATIENT TYPE:  INP   LOCATION:  3016                         FACILITY:  [facility]   PHYSICIAN:  Hilda Lias, M.D.   DATE OF BIRTH:  02-26-1932   DATE OF ADMISSION:  05/31/2006  DATE OF DISCHARGE:                                HISTORY & PHYSICAL   Mr. Shells is a gentleman who underwent surgery on March 10, 2006, because  of acute L3-4 radiculopathy associated with weakening of the quadriceps.  This problem was in the right side.  The patient did really well, he went  home, but 10 days later he was readmitted because of wound infection as well  as infected sebaceous cyst.  Since then the patient had been complaining of  lower back pain with radiation to the right leg.  He has a MRI of the lumbar  spine which showed no evidence of any recurring disc, some changes secondary  to scar tissue, but no evidence of any infection.  The patient had a  complete workup including a sedimentation rate of 31 on July 24, and another  one of 13 on May 09, 2006.  The patient had been getting physical therapy  at home.  He is not any better.  Today we were called because he had  difficulty getting out of the bed.   PAST MEDICAL HISTORY:  Lumbar discectomy followed by drainage of the wound.  He has had bypass surgery, inguinal surgery, right shoulder.  He is not  allergic to any medication.   REVIEW OF SYSTEMS:  Positive for urinary incontinence, back pain, leg pain.   PHYSICAL EXAMINATION:  GENERAL:  The patient came to my office several days  ago complaining of quite a bit of back pain and leg pain.  Although he was  telling me that his leg was giving out, it was difficult to find any  weakness.  HEENT:  Normal.  NECK:  Normal.  LUNGS:  There are some mild rhonchi bilaterally.  HEART SOUNDS:  There is a scar from previous surgery.  ABDOMEN:  Normal.  EXTREMITIES:  Normal.  NEUROLOGIC:  Mental  status and cranial nerves normal.  Strength:  He has  some mild weakening of the right leg but nothing like before surgery.  Reflexes 1+.  Sensation normal.  Straight leg raising is positive about 60  degrees.  Femoral stretch maneuver is plus/minus positive bilaterally.  He  has a wound which is healing by secondary intention.   CLINICAL IMPRESSION:  Persistent back pain.   RECOMMENDATIONS:  The patient is going to be admitted for workup.  He has an  appointment to be seen by infectious disease next week.  We are going to set  it up sooner.  We are going to repeat the x-ray including the possibility of  a myelogram.          ______________________________  Hilda Lias, M.D.    EB/MEDQ  D:  05/31/2006  T:  05/31/2006  Job:  [id number]

## 2011-02-12 NOTE — Group Therapy Note (Signed)
NAMEALVER, LEETE             ACCOUNT NO.:  [id number]   MEDICAL RECORD NO.:  [id number]          PATIENT TYPE:  ORB   LOCATION:  S159                          FACILITY:  [facility]   PHYSICIAN:  Edward L. Juanetta Gosling, M.D.DATE OF BIRTH:  [birth year]   DATE OF PROCEDURE:  10/05/2006  DATE OF DISCHARGE:                                 PROGRESS NOTE   Mr. Stutz is overall I think a little better than the last time I saw  him.  He is still quite sick.  He is eating a little better, but he is  not able to get up and do very much.  He is still having significant  pain in his back although we are trying to taper his pain medications.   His physical exam shows that he is sitting in the bead.  He has some wasting of the right leg.  His chest is pretty clear.  His heart is regular.  His abdomen is soft.   ASSESSMENT:  He has a severe infection of his back.  He has been told  that he needs at least 9 months of IV antibiotics.  He has mild chronic  obstructive pulmonary disease, unchanged.  He has coronary artery  occlusive disease with no symptoms right now.  He has malnutrition which  seems to be getting better.  He has profound weakness following his  surgery which is doing better.  I do not plan to change anything today.  He does need to have a CT of his back to see what is going on with the  infection. I am going to go ahead and order that.      Edward L. Juanetta Gosling, M.D.  Electronically Signed     ELH/MEDQ  D:  10/05/2006  T:  10/05/2006  Job:  [id number]

## 2011-02-12 NOTE — H&P (Signed)
NAME:  [name], [name]                       ACCOUNT NO.:  [id number]   MEDICAL RECORD NO.:  [id number]                   PATIENT TYPE:  OBV   LOCATION:  A322                                 FACILITY:  [facility]   PHYSICIAN:  Edward L. Juanetta Gosling, M.D.             DATE OF BIRTH:  Jan 06, 1932   DATE OF ADMISSION:  03/04/2004  DATE OF DISCHARGE:                                HISTORY & PHYSICAL   REASON FOR ADMISSION:  Back pain.   HISTORY:  [name] is a 77 year old who was in his usual state of fairly  good health when he developed the rather abrupt onset of severe back pain.  He went to the emergency room about 4 or 5 days prior to admission and was  given prescriptions for Tylox, prednisone, and a muscle relaxer which I  believe was Flexeril, but he has had little or no improvement and has had  enough pain that he was not able to get up, and has come to my office  requesting further help.  When he was seen in my office he did not have any  focal neurological findings but he appeared to be in a great deal of pain  and was sitting in a wheelchair.  I told him that we could attempt to treat  this as an outpatient or I could admit him.  He requested that we go ahead  and admit him to try to get resolution of his symptoms somewhat faster.   PAST MEDICAL HISTORY:  Positive for hypertension for which he takes  Lopressor 25 mg b.i.d. and Diovan HCT 80/12.5.  He has a history of coronary  disease status post bypass grafting, has a history of probably COPD although  that has not been definitively diagnosed as far as I can tell.  He has  peripheral vascular disease in the left lower extremity which was been  asymptomatic, hyperlipidemia, and is on Lipitor but I do not know the dose.  He has had a hernia repair.   MEDICATIONS:  As mentioned, as well as aspirin daily.   FAMILY HISTORY:  His mother died of coronary disease in her 62s.  Father had  stomach cancer in his 82s.  There are a number of  family members who have  had coronary disease.   SOCIAL HISTORY:  He works doing Public librarian, self-employed.  He stopped  smoking about 2 years ago but does have a very long smoking history.  Does  not drink any alcohol.   PHYSICAL EXAMINATION:  GENERAL:  Shows that he is sitting in a wheelchair  and appears to be in fairly marked distress.  VITAL SIGNS:  Blood pressure 120/80, his pulse is 80 and regular,  respirations are 18.  CHEST:  Clear without wheezes, rales, or rhonchi.  HEART:  Regular without gallop.  ABDOMEN:  Soft.  EXTREMITIES:  Showed no edema.  NEUROLOGIC:  He does not have any definite changes  in his reflexes.  He does  not have any focal neurological findings.   ASSESSMENT:  He has back pain.  He has a chronic history of back pain so  this is probably a reexacerbation of a chronic problem.   PLAN:  The plan is to get him admitted, see if we can get him more  comfortable.     ___________________________________________                                         Oneal Deputy. Juanetta Gosling, M.D.   Gwenlyn Found  D:  03/04/2004  T:  03/04/2004  Job:  [id number]

## 2011-02-12 NOTE — Group Therapy Note (Signed)
NAME:  [name], [name]                       ACCOUNT NO.:  [id number]   MEDICAL RECORD NO.:  [id number]                   PATIENT TYPE:  OBV   LOCATION:  A322                                 FACILITY:  [facility]   PHYSICIAN:  Edward L. Juanetta Gosling, M.D.             DATE OF BIRTH:  1932-08-06   DATE OF PROCEDURE:  DATE OF DISCHARGE:                                   PROGRESS NOTE   PROBLEMS:  1. Back pain.  2. Hypokalemia.  3. History of coronary occlusive disease, status post myocardial infarction.   SUBJECTIVE:  [name] says that he feels much better.  His back is  greatly improved.  He is doing PT.   PHYSICAL EXAMINATION:  VITAL SIGNS:  Temperature 97.2; pulse 71;  respirations 20; blood pressure 118/72.  CHEST:  Clear.  HEART:  Regular.  ABDOMEN:  Soft.   ASSESSMENT:  He is much improved.   PLAN:  Continue his treatments and medications.  I am going to switch him to  p.o. medications, but have him have the ability to get the IV if needed and  have him get an MRI of the back today.      ___________________________________________                                            Oneal Deputy. Juanetta Gosling, M.D.   Gwenlyn Found  D:  03/05/2004  T:  03/05/2004  Job:  [id number]

## 2011-02-12 NOTE — Discharge Summary (Signed)
NAME:  [name], [name]                       ACCOUNT NO.:  [id number]   MEDICAL RECORD NO.:  [id number]                   PATIENT TYPE:  OBV   LOCATION:  A322                                 FACILITY:  [facility]   PHYSICIAN:  Edward L. Juanetta Gosling, M.D.             DATE OF BIRTH:  December 06, 1931   DATE OF ADMISSION:  03/04/2004  DATE OF DISCHARGE:  03/06/2004                                 DISCHARGE SUMMARY   FINAL DISCHARGE DIAGNOSES:  1. Low back pain.  2. History of coronary artery occlusive disease.  3. Hypertension.  4. Probable early chronic obstructive pulmonary disease.  5. Hypokalemia.   HISTORY OF PRESENT ILLNESS:  [name] is a 77 year old in his usual state  of fairly good health.  He had the rather abrupt onset of severe back pain.  He went to the emergency room several days prior to admission, was treated  with prednisone, Tylox, muscle relaxers, but he continued having pain and  came to my office on the morning of admission in excruciating pain and  really unable to get up and move at all.  His pain was rated at a 10/10, and  he had difficulty even shifting his weight.  He did not have any definite  neurological findings, and he had no numbness.   PHYSICAL EXAMINATION:  GENERAL APPEARANCE:  Well-developed, well-nourished  male.  CHEST:  Fairly clear.  HEART:  Regular.  ABDOMEN:  Soft.  BACK:  Diffusely tender.   STUDIES:  X-rays showed chronic changes but no fracture.   LABORATORY WORK:  Unremarkable, except for potassium that was slightly low.   HOSPITAL COURSE:  He was treated with IV pain medications, had PT  consultation and was given muscle relaxers.  He was scheduled for an MRI of  the lumbar spine but was unable to do this because of claustrophobia.  He  improved greatly.  Was able to get up and move around and was discharged  home on March 06, 2004.   DISCHARGE MEDICATIONS:  1. Tylox 1-2 q.i.d. p.r.n. pain.  2. Medrol Dosepak as directed.  3. Flexeril 10  mg b.i.d.   PLAN:  Set up for an outpatient open MRI.    ___________________________________________                                         Oneal Deputy. Juanetta Gosling, M.D.   ELH/MEDQ  D:  03/06/2004  T:  03/07/2004  Job:  [id number]

## 2011-02-12 NOTE — H&P (Signed)
[name], [name]             ACCOUNT NO.:  [id number]   MEDICAL RECORD NO.:  [id number]          PATIENT TYPE:  ORB   LOCATION:  S159                          FACILITY:  [facility]   PHYSICIAN:  Edward L. Juanetta Gosling, M.D.DATE OF BIRTH:  Aug 12, 1932   DATE OF ADMISSION:  08/08/2006  DATE OF DISCHARGE:  LH                                HISTORY & PHYSICAL   REASON FOR ADMISSION:  [name] is a 77 year old who has had a lumbar  laminectomy in June 2007 then developed a wound infection which required I&D  then he had recurrent Enterobacter wound infection with osteomyelitis.  He  has had multiple other medical problems while he was hospitalized including  acute renal failure, acute respiratory failure, atrial fibrillation with  rapid ventricular response, pulmonary edema, dysphasia requiring tube  feeding.  In addition to that he has coronary artery occlusive disease, had  bypass grafting in 1996, he has COPD, hypertension, chronic low back pain,  right hand tremors.   ALLERGIES:  HE HAS NO KNOWN DRUG ALLERGIES.   FAMILY HISTORY:  His family history is positive for coronary disease and  apparently for cancer.   SOCIAL HISTORY:  He is married.  He had been independent and working.  He  did not use any alcohol.  He stopped smoking about 5 years ago.   PHYSICAL EXAMINATION:  GENERAL:  His physical examination now shows a well-  developed, well-nourished male who is much thinner than when I saw him last.  He is afebrile now.  HEENT:  His pupils are reactive.  His nose and throat are clear.  His mucous  membranes are dry.  NECK:  His neck is supple.  CHEST:  Chest is fairly clear without rhonchi.  His heart seems regular now.  I have not done an EKG.  He may still be in atrial fib with a well-  controlled rate.  ABDOMEN:  His abdomen is soft.  He has a feeding tube in place.  EXTREMITIES:  His extremities showed no edema.  CNS:  Grossly intact.   ASSESSMENT:  He has failure to thrive  with anorexia being treated with tube  feedings, congestive heart failure which is now compensated, atrial  fibrillation he is on amiodarone, tube feedings.  Decubitus ulcer stage II  right buttock and left heel, depression on treatment, coronary artery  occlusive disease, chronic obstructive pulmonary disease, hypertension,  history of respiratory failure, history of acute renal failure, history of  surgery for low back pain.   PLAN:  Plan is to continue with his tube feedings, try to get him to take  oral intake, work with him on PT, etc., with the eventual goal of having him  able to go home.      Edward L. Juanetta Gosling, M.D.  Electronically Signed     ELH/MEDQ  D:  08/14/2006  T:  08/14/2006  Job:  [id number]

## 2011-02-12 NOTE — Group Therapy Note (Signed)
NAMEELMORE, HYSLOP             ACCOUNT NO.:  [id number]   MEDICAL RECORD NO.:  [id number]          PATIENT TYPE:  INP   LOCATION:  A331                          FACILITY:  [facility]   PHYSICIAN:  Edward L. Juanetta Gosling, M.D.DATE OF BIRTH:  January 17, 1932   DATE OF PROCEDURE:  03/20/2006  DATE OF DISCHARGE:                                   PROGRESS NOTE   Mr. Piper has had fever through the night was somewhat hypotensive as  well.  He says he is feeling okay now but he is complaining of intense lower  back pain.   PHYSICAL EXAMINATION TODAY:  GENERAL:  Shows that his he looks  uncomfortable.  VITAL SIGNS:  Temperature is 98.7, pulse 89, respirations 20, blood sugar  185, blood pressure 85/49, O2 saturations 95% on 2 liters.  CHEST:  His chest is clear.  HEART:  Heart is regular.  BACK:  His back shows now that he has the upper part of his surgical  incision has some purulent material.   I have discussed his situation with Clydene Fake, M.D. who requested he  be transferred to [facility] for possible operative treatment.   LAB WORK TODAY:  Shows white count is 12,100, hemoglobin 14.4, platelets  105, BUN is 38, creatinine 2.6 up from yesterday.  His hemoglobin A1c was  9.5, so once he gets over all this he is going to need to be started on  treatment for diabetes.  His culture of the wound above the incision showed  rare squamous epithelial cells and gram-negative rods. Blood culture  reported now with gram-negative rods and another blood culture no growth but  at less than 24 hours.  Urine culture is pending and two more blood cultures  are pending.   ASSESSMENT:  He has gram-negative rods sepsis.  He now has inflamed looking  area at the top of his surgical wound.   PLAN:  Is for transfer to [facility] back to the neurosurgical team to let  them see if they need to clean the wound out.      Edward L. Juanetta Gosling, M.D.  Electronically Signed     ELH/MEDQ  D:  03/20/2006  T:   03/21/2006  Job:  [id number]

## 2011-02-12 NOTE — Assessment & Plan Note (Signed)
[facility]                       [place] CARDIOLOGY OFFICE NOTE   HEATON, SARIN                    MRN:          [id number]  DATE:11/11/2006                            DOB:          26-Jun-1932    IDENTIFICATION:  [name] is a gentleman who was seen remotely in  Cardiology Clinic back in 2004.  He comes in for followup.   The patient this fall had a long hospitalization.  After back surgery he  became infected and had a 23-month hospitalization.  Cardiology service  actually saw him during this admission for atrial fibrillation.  Consult  was made on September 21, he was on the rehab service when he went into  atrial fibrillation, resolved spontaneously and then recurred.  He was  started on Cardizem and then Lopressor, eventually put on amiodarone and  started on Coumadin.   Note an echocardiogram was also done during this hospitalization that  showed mild LV dilatation, LVF was 45% to 50%.  There was mild MR noted.  The left atrium was noted to be mildly dilated, the right ventricle was  noted to be mildly dilated.   The patient was discharged to a rehab center, and is now about to go  home, wanted his medications clarified.  He still remains very, very  weak in his lower extremities, can only use hand bars to move.   CURRENT MEDICATIONS:  1. Metoprolol 25 b.i.d.  2. Glipizide 5 daily.  3. Amiodarone 200 daily.  4. Cymbalta 30 daily.  5. Ciprofloxacin 250 b.i.d.  6. Diazepam 2.5 b.i.d.  7. Coumadin as directed.   PAST MEDICAL HISTORY:  1. CAD, status post CABG in 1996, last Cardiolite 2002 which was      negative for ischemia.  2. Peripheral vascular disease.  3. Dyslipidemia.  Tried Lipitor for a while, not on now.  4. Hypertension.  5. DJD with spinal surgery.  I have not reviewed records fully.   PHYSICAL EXAMINATION:  The patient is in no distress, sitting in a  wheelchair.  Blood pressure is 120/58, pulse is 82  and regular, weight not taken.  NECK:  JVP is normal, no bruits.  LUNGS:  Clear.  CARDIAC:  Regular rate and rhythm, S1, S2, no S3, no significant  murmurs.  ABDOMEN:  Supple.  EXTREMITIES:  Trace edema.   Twelve-lead EKG sinus rhythm, 78 beats per minute.  Left axis deviation.  Slow R wave progression.   IMPRESSION:  1. Coronary artery disease.  Appears to be stable clinically.  Note he      is not even on an aspirin, and I have recommended that he take 81      mg daily.  Would continue metoprolol as well.   We will need to have him set back for fasting lipids, but again he is  going through a lot right now and until his LFTs are clarified would  not.  1. Atrial fibrillation.  The patient's wife wants to know if he still      needs to take Coumadin.  It sounds like on the rehab service he was  not acutely ill, and he had this spell of recurrence.  His records      will need to be reviewed, but would keep him on the amiodarone and      Coumadin for now.  I will check a TSH and a CMET.   I will set followup for a couple of months from now.  He is due to go  home.  I talked to him about low salt diet.     Caleb Riffle, MD, [facility]  Electronically Signed    PVR/MedQ  DD: 11/11/2006  DT: 11/12/2006  Job #: [id number]

## 2011-02-12 NOTE — Discharge Summary (Signed)
[name], [name]             ACCOUNT NO.:  [id number]   MEDICAL RECORD NO.:  [id number]          PATIENT TYPE:  INP   LOCATION:  A331                          FACILITY:  [facility]   PHYSICIAN:  Edward L. Juanetta Gosling, M.D.DATE OF BIRTH:  [birth year]   DATE OF ADMISSION:  03/19/2006  DATE OF DISCHARGE:  06/24/2007LH                                 DISCHARGE SUMMARY   FINAL DISCHARGE DIAGNOSES:  1.  Infected surgical wound.  2.  Status post lumbar laminectomy.  3.  Sebaceous cyst infected.  4.  Hypertension.  5.  Coronary artery occlusive disease status post bypass grafting.  6.  Chronic obstructive pulmonary disease.  7.  Peripheral vascular disease in the left lower extremity.  8.  Hyperlipidemia.  9.  History of her hernia repair.  10. Diabetes mellitus, new onset.  11. Hyponatremia.  12. Hypokalemia.   HISTORY:  [name] is a 77 year old who came to the emergency because of  fever.  He had had surgery about 10 days prior for lumbar laminectomy and  had done very well.  Actually he had stopped taking any pain medication at  all and then he developed fever to as high as 102 and a severe back pain at  the same time.  He came to the emergency room because of the fever and in  the emergency room initially his surgical wound looked clean and dry but he  did have what looked like an infected sebaceous cyst 3-4 inches above the  area of the surgical wound.  He had chills and fever.  He had some cough.  He has had no drainage from his incision site.  He had severe back pain.   His physical exam showed that he looked acutely ill.  He is complaining of  pain.  He was diaphoretic.  His chest was clear without wheezes, rales or  rhonchi.  Heart was regular without gallop.  His abdomen was soft.  Extremities showed no edema.  He had a well healing scar on his lumbar spine  and about 4 inches above that had a 3 cm sebaceous cyst that was draining  some purulent material.   Urine showed 3-7  white cells, 11-20 red cells.  BMET showed a sodium was  129, potassium 3.4. His blood sugar was 241.  He did not have a previous  history of diabetes mellitus.  Chest x-ray showed atelectasis.  CBC showed  white count 10,400, hemoglobin of 15.9.   HOSPITAL COURSE:  He was admitted, started on antibiotics.  It was not clear  exactly what the source of his fever was at that time.  He underwent a CT of  the lumbar spine to see if there was any evidence of infection in the  surgical site.  This showed some air which was felt to be within the limits  of what would be seen with having had surgery.  He had multiple cultures  done.  On the night of admission, he had marked rigors, developed increasing  fever, had Zosyn added to his previous antibiotics and was given fluid  boluses because he became  somewhat hypotensive.  He was hypotensive but  actually did not appear in shock.  He was given fluids, improved his blood  pressure.  The next morning when I came in to see him, he was still  complaining of severe back pain.  His white blood count was slightly higher.  He was still mildly hypotensive, although he was awake and alert.  His  surgical wound showed that he had what appeared to be the upper part of the  wound showed some separation of the tissue which was not present on the day  prior and some what appeared to be some purulent material.  He grew gram-  negative rods in a blood culture.  His urine culture is still pending.  The  wound culture is still pending.  His renal dysfunction was worse this  morning than yesterday.  He had a creatinine of about 1.6 and is now 2.9.   ASSESSMENT:  Clearly he is very sick with this.  It is probably a surgical  wound infection.  I have discussed his situation with Dr. Colon [name].  He  wants him transferred to [facility] for further evaluation and perhaps  operative care.  I am going to go and give him another bolus of fluids.  He  is on Zosyn,  vancomycin, Zithromax.  He has had his normal blood pressure  medications which of course I am going to hold now.  He is also on Rocephin.  I am going to ask him not to eat or drink anything in case he needs  operative treatment today.  He is going to be transferred by [facility] to  [facility].      Edward L. Juanetta Gosling, M.D.  Electronically Signed     ELH/MEDQ  D:  03/20/2006  T:  03/21/2006  Job:  [id number]

## 2011-02-12 NOTE — H&P (Signed)
[name], [name]             ACCOUNT NO.:  [id number]   MEDICAL RECORD NO.:  [id number]          PATIENT TYPE:  IPS   LOCATION:  4003                         FACILITY:  [facility]   PHYSICIAN:  Erick Colace, M.D.DATE OF BIRTH:  September 28, 1931   DATE OF ADMISSION:  07/12/2006  DATE OF DISCHARGE:                                HISTORY & PHYSICAL   ATTENDING:  Dr. Riley Kill.   REASON FOR ADMISSION:  Decline in self-care mobility following osteomyelitis  L3-4.   HISTORY OF PRESENT ILLNESS:  A 77 year old male with past history of  coronary artery disease had low back pain secondary to HNP L3 and L4 with  acute radiculopathy requiring L3-L4 laminectomy March 10, 2006.  He had  postoperative wound infection, I&D March 20, 2006, treated with antibiotics.  Readmitted May 31, 2006, with recurrent Enterobacter wound infection  and osteomyelitis L3-L4 level.  ID consulted for input.  Started on  vancomycin and Rocephin initially, but then vancomycin was [facility] June 05, 2006.  Hospital course was complicated by renal failure secondary to ARB and  NSAIDs.  Nephrology consulted, treated with IV fluid and medication  adjustment and short term dialysis, but he has improvement in his renal  status.  In addition, he developed acute respiratory failure on June 10, 2006, secondary to narcotics, followed by critical care management,  increase in delirium noted, morphine was discontinued.  Developed atrial  fibrillation with RVR.  A 2D echo showed EF of 45% to 50%.  Cardiology  consulted.  Treated with amiodarone and Cardizem drip.  Started on IV  heparin, transitioned to Coumadin.  The patient has had IV diuretics, Lasix  drip, for his pulmonary edema.  Palliative care following for pain control  issues, poor p.o. intake, tube feeding, dysphagia diet, D3 nectar thick  liquids.  Last hemoglobin 9.1 on July 12, 2006, white count 10.9,  platelets 308,000, INR 3.8.  On July 08, 2006,  his BUN was 56, creatinine  1.5.   REVIEW OF SYSTEMS:  Positive for weakness, particularly right lower  extremity; insomnia; depression; and low back pain.   PAST MEDICAL HISTORY:  In addition to the above:  1. COPD.  2. Hypertension.  3. Right hand tremors.   PAST SURGICAL HISTORY:  CABG as well as excision of sebaceous cyst March 20, 2006.   FAMILY HISTORY:  CAD and carcinoma.   SOCIAL HISTORY:  Married, lives in a 2-level home, 2 steps to enter.  No  EtOH and quit tobacco in 2002.   FUNCTIONAL HISTORY:  Independent prior to admission.  Was working until June  2007.  Impaired current functional status, needs physical assistance for  ADLs and mobility.   HOME MEDICATIONS:  Include etodolac, metoprolol, Atacand, and Flexeril.   ALLERGIES:  NONE KNOWN.   EXAMINATION:  GENERAL:  Elderly male appears tired, but no acute distress.  HEENT:  His eyes are anicteric, not injected.  __________ is normal.  Has  decreased hearing.  Wears dentures.  NECK:  Supple without adenopathy.  RESPIRATORY:  Effort is good, but has decreased breath sounds at bases  bilaterally.  ABDOMEN:  Positive bowel sounds, soft.  HEART:  Regular rate and rhythm.  EXTREMITIES:  Trace pretibial edema bilaterally.  His motor strength is 2-  in the right quad, 3- in the ankle dorsiflexor.  Left lower extremity is 3+  in the hip flexor, knee extensor, ankle dorsiflexor.  Upper extremities are  5/5 bilaterally.   IMPRESSION:  1. Functional deficits due to L3-4 osteomyelitis and diskitis secondary to      Enterobacter infection, finishing intravenous Rocephin, switching to      p.o. antibiotics.  Will initiate comprehensive intensive inpatient      rehabilitation with physical therapy, occupational therapy, speech      therapy at a CRR level.  Twenty-four hour rehab nursing, 24-hour      physiatry.  Pain management:  Continue Dilaudid 7 mg q.6 hours with      some breakthrough as well.  Will attempt to wean as  able.  Avoid      nonsteroidal antiinflammatory drugs, secondary to his recent renal      failure.  2. Atrial fibrillation on Coumadin.  Rate controlled on amiodarone.  3. Anorexia.  Monitor intake of fluids and calories.  4. Situational depression on Cymbalta.  May need neuropsych evaluation.  5. Congestive heart failure compensated.  Check intake and output, daily      weights, Lasix.  6. Hyperglycemia secondary to tube feeds.  Will discontinue Lantus and      monitor blood sugars.  7. Decubitus ulcer right buttocks.  Continue rehab nursing, air mattress,      Mepilex.  8. Anemia of critical illness.  9. Renal insufficiency, stable.  Avoid nonsteroidal antiinflammatory      drugs.  Avoid dehydration.   ESTIMATED LENGTH OF STAY:  Fourteen to 21 days.  The patient is a good rehab  candidate for goals with supervision.      Erick Colace, M.D.  Electronically Signed     AEK/MEDQ  D:  07/12/2006  T:  07/13/2006  Job:  [id number]   cc:   Lacretia Leigh. Ninetta Lights, M.D.  Hilda Lias, M.D.  Noralyn Pick. Eden Emms, MD, [facility]  Dr. Lendell Caprice  Dr. Juanetta Gosling

## 2011-02-12 NOTE — Op Note (Signed)
[facility]  Patient:    [name], [name] Visit Number: [id number] MRN: [id number]          Service Type: OBV Location: 3A A313 01 Attending Physician:  Barbaraann Barthel Dictated by:   Barbaraann Barthel, M.D. Proc. Date: 10/04/01 Admit Date:  10/04/2001   CC:         Kari Baars, M.D.   Operative Report  PREOPERATIVE DIAGNOSIS:  Right inguinal hernia.  POSTOPERATIVE DIAGNOSIS:  Right inguinal hernia (indirect hernia).  PROCEDURE:  Right inguinal herniorrhaphy (modified McVay repair).  SURGEON:  Barbaraann Barthel, M.D.  ANESTHESIA:  General anesthesia.  NOTE:  This is a 77 year old white male who presented with a large mass in his groin.  He was scheduled for elective repair after cardiac clearance and after GI clearance.  As we noted, he had some guaiac-positive stools and underwent a preoperative colonoscopy which revealed polyps which were removed and these were benign.  Cardiology consult was also obtained because this patient had had a previous coronary artery bypass.  We discussed the procedure in detail including the risks not limited to, but including, bleeding, infection and recurrence and informed consent was obtained.  GROSS OPERATIVE FINDINGS:  The patient had very thick subcutaneous tissue and a lot of fatty properitoneal fat around the cord structures, an indirect hernia which did not reveal any incarcerated intra-abdominal contents, otherwise, a rather large indirect inguinal hernia.  TECHNIQUE:  The patient was placed in a supine position and after the adequate administration of general anesthesia by endotracheal intubation, his entire abdomen was prepped with Betadine solution and draped in the usual manner. Foley catheter had been aseptically inserted.  An incision was carried out between the anterosuperior iliac spine and the pubic tubercle, through skin, subcutaneous tissue and Scarpas layer, down to the external  oblique, which was opened through the external ring.  The cord structures were dissected free tediously from all the properitoneal fat and identifying an indirect hernia sac, which was opened, and under direct vision, doubly suture-ligated and the redundant portion of which was amputated.  There was considerable fatty properitoneal fat at the area of the internal ring; this was clamped and ligated with 2-0 silk as well.  Once this had taken place, the fascia transversalis and transversus abdominis were then sutured to Coopers ligament and Pouparts ligament with interrupted 2-0 Bralon sutures and the cord structures were returned to their anatomic position and the external oblique was then approximated over the cord structures with a running 3-0 Polysorb suture.  Subcu was irrigated and the skin was approximated with a stapling device.  Prior to closure, all sponge, needle and instruments counts were found to be correct.  Estimated blood loss was minimal.  There were no complications to spinal anesthesia.  Patient tolerated the procedure well and was taken to recovery room in satisfactory condition. Dictated by:   Barbaraann Barthel, M.D. Attending Physician:  Barbaraann Barthel DD:  10/04/01 TD:  10/04/01 Job: [id number]

## 2011-02-12 NOTE — Discharge Summary (Signed)
NAMEKALAI, BACA             ACCOUNT NO.:  [id number]   MEDICAL RECORD NO.:  [id number]          PATIENT TYPE:  INP   LOCATION:  3025                         FACILITY:  [facility]   PHYSICIAN:  Hilda Lias, M.D.   DATE OF BIRTH:  1932/09/23   DATE OF ADMISSION:  03/20/2006  DATE OF DISCHARGE:  03/29/2006                                 DISCHARGE SUMMARY   ADMISSION DIAGNOSIS:  Local wound infection, __________   MEDICAL HISTORY:  The patient underwent surgery on March 10, 2006.  The  patient has a large herniated disk with quite a bit of weakness.  The  patient did well, went home, but he was readmitted on March 20, 2006, because  he threw up at [facility] with quite a bit of sepsis.  Because of the  findings, he was transferred immediately to [facility].   LABORATORY DATA:  At the present time, the sedimentation rate is 45,  __________ 8.0.  The wound grew out Enterobacter cloacae.   HOSPITAL COURSE:  The patient was taken to surgery by Dr. Phoebe Perch on March 20, 2006, and drainage of the wound was accomplished as well as the sebaceous  cyst.  The patient was kept in the intensive care unit.  He was seen by the  pulmonologist, as well as from infectious disease.  The area was cleaned  every day.  Today, the patient is doing great.  He has had no fever for the  past 3 or 4 days.  The wound is healing by secondary intention.  Infectious  disease feels that the patient can go home on p.o. antibiotics.  I spoke  with the wife, the patient, and both of them agreed to go home.   CONDITION ON DISCHARGE:  Improving.   DISCHARGE MEDICATIONS:  Percocet and Flexeril.  He is going to be continued  taking 400 mg p.o. of Avelox for 3 weeks.   DISCHARGE INSTRUCTIONS:  Also, home health nurse will be seeing him every  day for cleaning the wound.   FOLLOWUP:  Will be seen in 3 weeks.  The family is fully aware that I will  be calling to see how he is doing.     ______________________________  Hilda Lias, M.D.     EB/MEDQ  D:  03/29/2006  T:  03/29/2006  Job:  [id number]

## 2011-02-12 NOTE — Op Note (Signed)
[name], [name]             ACCOUNT NO.:  [id number]   MEDICAL RECORD NO.:  [id number]          PATIENT TYPE:  AMB   LOCATION:  SDS                          FACILITY:  [facility]   PHYSICIAN:  Hilda Lias, M.D.   DATE OF BIRTH:  08-25-1932   DATE OF PROCEDURE:  03/10/2006  DATE OF DISCHARGE:                                 OPERATIVE REPORT   PREOPERATIVE DIAGNOSES:  1.  Acute right lumbar 3, lumbar 4 herniated disk with,  2.  Acute radiculopathy; and,  3.  Weakening of the quadriceps.   POSTOPERATIVE DIAGNOSES:  1.  Acute right lumbar 3, lumbar 4 herniated disk with,  2.  Acute radiculopathy; and,  3.  Weakening of the quadriceps.   OPERATION PERFORMED:  1.  Right lumbar 3, lumbar 4 laminotomy.  2.  Removal of a large fragment.  3.  Microscopic.   SURGEON:  Hilda Lias, M.D.   ASSISTANT:  Coletta Memos, M.D.   CLINICAL HISTORY:  The patient was seen by me early this morning with the  sudden onset of pain down to the right leg.  The patient had quite a bit of  weakness with the quadriceps being the source.  He has fallen twice.  MRI  showed a large fragment of L3, 4.  Surgery was advised.  The risks were  explained during the history and physical.   DESCRIPTION OF THE OPERATION:  The patient was brought to the OR and was  positioned __________.  The back was prepped with Betadine.  A midline  incision from 3-4 was made.  The muscles were retracted laterally.  We  brought the microscope into the area and we drilled the lower lamina of L3  and the upper of L4.  A thick calcified ligament was removed.  We retracted  the dura mater and indeed there was a large fragment compromising mostly the  L4 nerve root.  Removal was accomplished.  We investigated the L3, L4 disk  space and it was completely flat.  There was no opening.  The L4  foraminotomy was accomplished.  Valsalva maneuver was negative.   Fentanyl and Depo-Medrol were left in the epidural space and the wound  was  closed with Vicryl and Steri-strips.           ______________________________  Hilda Lias, M.D.     EB/MEDQ  D:  03/10/2006  T:  03/10/2006  Job:  [id number]

## 2011-02-12 NOTE — Consult Note (Signed)
NAMEWAEL, MAESTAS             ACCOUNT NO.:  [id number]   MEDICAL RECORD NO.:  [id number]          PATIENT TYPE:  INP   LOCATION:  3016                         FACILITY:  [facility]   PHYSICIAN:  Dyke Maes, M.D.DATE OF BIRTH:  Dec 16, 1931   DATE OF CONSULTATION:  DATE OF DISCHARGE:                                   CONSULTATION   DATE OF CONSULTATION:  June 07, 2006.   REASON FOR CONSULTATION:  Acute renal failure.   HISTORY OF PRESENT ILLNESS:  This is a 77 year old white male who was  admitted on May 31, 2006 for recurrent Enterobacter wound  infection/osteomyelitis of the L3-L4 following a laminectomy in June of  2007.  He has no known history of renal disease.  His serum creatinine on  March 19, 2006, was 1.4.  March 28, 2006:  1.2.  June 02, 2006:  1.7.  June 06, 2006:  4.5.  June 07, 2006:  5.9.  I have noticed the  fact that he is been on Avapro and Lodine during this hospitalization.  He  has had an occasional blood pressure drop into the 80-90 range.  Urine  outputs are not well recorded.   PAST MEDICAL HISTORY:  1. Significant for hypertension for at least 10 years, possibly longer.  2. Coronary disease status post CABG.  3. Peripheral vascular disease.  4. Status post L3-L4 laminectomy in June 2007.  5. Status post hernia repair.  6. History of hyperlipidemia.   ALLERGIES:  None.   MEDICATIONS:  1. Lopressor 25 mg b.i.d.  2. __________  mg t.i.d.  3. Rocephin 1 g q.24.  4. Fleet Phospho-Soda 40 milliliters per day.  5. Lodine and Avapro were discontinued today.   SOCIAL HISTORY:  He is an ex-smoker, 1-2 packs per day for 50 years, he quit  6 years ago.  He denies alcohol use.  He is married.   FAMILY HISTORY:  Mother died of coronary disease at the age of 75.  His  father died of gastric cancer in his 75's.   REVIEW OF SYSTEMS:  Appetite has been poor though this is not new.  No  shortness of breath, no chest pains or chest  pressures.  He has not moved  his bowels in several days but he does not feel constipated.  No dysuria.  He has back pain related to his wound infection.  No neuropathic symptoms or  arthritic complaints, except as mentioned above.   PHYSICAL EXAMINATION:  Blood pressure is 194/55, pulse 75, temperature 97.7.  GENERAL:  Elderly 77 year old white male who is somnolent but arousable.  HEENT:  Sclerae nonicteric.  His extraocular muscles are intact.  NECK:  There is no JVD.  LUNGS:  Clear to auscultation.  HEART:  Regular rate and rhythm without murmur, rub or gallop.  ABDOMEN:  Positive bowel sounds.  Nontender, nondistended.  No  hepatosplenomegaly.  I am unable to roll him over for me to view his back  due to his weakness and pain.  EXTREMITIES:  No focal tenderness or edema.  Pulses are 2 of 4 and equal  bilaterally.  NEUROLOGICAL EXAM:  Cranial nerves intact.  Motor and sensory intact.  Positive asterixis.   LABORATORY DATA:  Sodium 138, potassium 3.9, creatinine 5.9, BUN 41,  hemoglobin 9.0, white count 12.4, platelet count 183,000.  Urinalysis is  benign.  Chest x-ray shows no pulmonary edema.   IMPRESSION:  1. Acute renal failure secondary to multiple factors, including      hypertension, infection, ARB and nonsteroidal medications.  2. Enterobacter wound infection/osteomyelitis.  3. Mild hypertension.  4. Coronary disease.   RECOMMENDATIONS:  1. Discontinue Lodine, Avapro and __________ .  2. IV fluids.  3. Foley catheter.  4. Check ultrasound.  5. Daily serum creatinine.  6. He very well may need hemodialysis if renal function worsens over next      few days, but even if this were the case, I would anticipate a recovery      of renal function.  I discussed this in detail with both him and his      wife.  7. Follow up __________ .           ______________________________  Dyke Maes, M.D.     MTM/MEDQ  D:  06/07/2006  T:  06/07/2006  Job:  [id number]

## 2011-02-12 NOTE — Consult Note (Signed)
[facility]  Patient:    LJ, MIYAMOTO Visit Number: [id number] MRN: [id number]          Service Type: END Location: DAY Attending Physician:  Malissa Hippo Dictated by:   Lionel December, M.D. Proc. Date: 09/11/01 Admit Date:  09/12/2001   CC:         Barbaraann Barthel, M.D.             Kari Baars, M.D.                          Consultation Report  DATE OF BIRTH:  1932-06-23.  REASON FOR CONSULTATION:  Rectal bleeding.  HISTORY OF PRESENT ILLNESS:  The patient is a 77 year old Caucasian male who was kindly referred through the courtesy of Dr. Malvin Johns for evaluation of GI bleed and colonoscopy.  The patient was seen by him more recently because of a right inguinal hernia.  However, on examination he was noted to have heme-positive stools.  The patient states that in the last few months he has had 3 episodes of bleeding.  While two were just a scant amount of blood on the tissue, on one occasion, he passed a moderate amount of fresh blood.  He denies abdominal pain or rectal pain.  There has been no change in his bowel habits recently.  He bowels generally move once or twice daily and they are formed.  He has a good appetite.  He has gained about 10 pounds this year.  He has never undergone screening for colorectal carcinoma.  He has planning to have herniorrhaphy once he is cleared from a GI standpoint.  REVIEW OF SYSTEMS:  Negative for heartburn, dysphagia, nausea or vomiting.  MEDICATIONS:  He is presently on: 1. Lopressor 25 mg b.i.d. 2. A.S.A. 325 mg q.d. 3. Mavik 2 mg q.d. 4. Darvocet-N 100 p.r.n. for back pain.  PAST MEDICAL HISTORY: 1. He has CAD. 2. MI in 1996 for which he had a CABG.  He has done well since then.  He has    used a nitroglycerin spray very occasionally. 3. He has severe arthritis involving L1, L2 and L4. 4. He had right knee arthrotomy to correct a torn cartilage, several years    ago. 5.  Tonsillectomy in the 1960s and repair of right rotator cuff tear in the    late 80s. 6. He has had 3 sebaceous cysts removed from his back along a few moles from    his face. 7. Hypertension.  ALLERGIES:  No known drug allergies.  FAMILY HISTORY:  Mother died of CAD at age 17 and father died of carcinoma of the stomach at age 109.  By the time it was diagnosed it was widely metastatic He has 3 sisters living.  Three sisters and 3 brothers are deceased.  One sister and a brother had heart disease and had undergone CABG.  Two other brothers also had CAD.  One sister had heart problems and Alzheimers disease. One of his sisters had sigmoid colon resection for early carcinoma in January 2002 and is doing fine.  She is 75 years old.  SOCIAL HISTORY:  He is married and has 1 child.  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  He is self-employed.  He smoked 2 packs a day for the past 50 years but finally quit 2 years ago for good.  He does not drink alcohol.  PHYSICAL EXAMINATION:  GENERAL:  Pleasant, moderately obese, Caucasian male who is no acute distress. He weighs 218 pounds.  He is 5 feet 11 inches tall.  VITAL SIGNS:  Pulse 72 per minute, blood pressure 128/70, temperature 97.7.  HEENT:  Conjunctivae is pink.  Sclerae is nonicteric.  Oropharyngeal mucosa is normal.  NECK:  Without masses or thyromegaly.  Carotids are 2+ bilaterally without bruits.  He recently had carotid Dopplers as well as cardiolite but results are pending.  CARDIAC:  Regular rhythm, normal S1, S2.  He has faint systolic ejection murmur best heard at the left lower sternal border.  LUNGS:  Clear to auscultation.  ABDOMEN:  Protuberant but soft.  Nontender without organomegaly or masses.  He has completely reducible right inguinal hernia.  RECTAL:  Deferred as he had one recently by Dr. Malvin Johns at the time of colonoscopy.  EXTREMITIES:  He does have not have peripheral edema or  clubbing.  ASSESSMENT:  The patient is a 77 year old Caucasian male with 3 episodes of hematochezia recently.  He is also noted to have a heme-positive stool on routine rectal exam by Dr. Malvin Johns.  It is likely that his hematochezia was secondary to hemorrhoids but his colon needs to be examined to make sure he does not have a colonic neoplasm.  FAMILY HISTORY:  Significant for colon carcinoma in a sister who had surgery earlier this year but she is 33 and this may just be a quiet or sporadic case rather than familial.  RECOMMENDATIONS:  Total colonoscopy to be performed at [facility] on September 12, 2001.  I have reviewed the procedures and risks with the patient and his and wife and they are both agreeable.  I would like to thank Dr. Izora Ribas for giving [place] the opportunity to participate in the care of this gentleman. Dictated by:   Lionel December, M.D. Attending Physician:  Malissa Hippo DD:  09/11/01 TD:  09/12/01 Job: [id number]

## 2011-02-12 NOTE — Consult Note (Signed)
NAMEALANZO, [name]             ACCOUNT NO.:  [id number]   MEDICAL RECORD NO.:  [id number]          PATIENT TYPE:  INP   LOCATION:  2910                         FACILITY:  [facility]   PHYSICIAN:  Noralyn Pick. Eden Emms, MD, FACCDATE OF BIRTH:  05-28-1932   DATE OF CONSULTATION:  06/17/2006  DATE OF DISCHARGE:                                   CONSULTATION   [name] is an unfortunate 77 year old gentleman who has had a  complicated course from back surgery in June.   He has suffered an infection with sepsis.  He has been up on the rehab  floor.   The patient is a patient of Nanine Means who has had CABG in 1996, we do not  have records from his CABG.  He has had 2 echoes since he has been in the  hospital in September, both with EFs in the 45-50% range, without large wall  motion abnormalities and without critical valve disease.   He had some PAF last night which resolved spontaneously.  He had some chest  heaviness then.  One point of care marker was negative.  Tonight he had  recurrent rapid atrial fib or flutter with a rate of 150 and he has been  somewhat difficult to control.  Dr. Ladona Ridgel has already given him a bolus and  15 mg/hr drip of Cardizem as well as 2.5 of Lopressor.   We were asked to help evaluate him in regards to his rapid afib and  substernal chest pain.   PAST HISTORY:  Remarkable for:  1. Hypertension.  2. CABG in 1996 by Dr. Laneta Simmers.  3. Peripheral vascular disease on the left side.  4. Status post L3-4 laminectomy in June of 2007, which multiple      complications since then.  5. Status post right hernia repair.   HE HAS NO KNOWN ALLERGIES.   He had been on:  1. Lopressor 25 b.i.d.  2. Rocephin.   His anti-inflammatories and Avapro were discontinued when he went into acute  renal failure.  This hospitalization he has been dialyzed and his creatinine  is coming down into the 3.5 range.   He is a previous smoker, he lives with his wife in the [place]  area.   The patient has been fairly sick.  His wife wishes everything to be done for  him, but if something looks hopeless she does not want him to have heroic  measures done.   The patient is somewhat confused at this time and further history is  somewhat limited.  He had been significantly hypertensive previously.  Right  now his blood pressure is about 125/70.  He is in afib flutter at a rate of  150.  Sats are 99%.  He is somewhat pale.  His dialysis catheter has been  removed from the right IJ.  He has a PICC line in the right arm.  LUNGS:  Relatively clear anteriorly, there is no S1, S2.  There is a soft  systolic murmur.  ABDOMEN:  Benign.  LOWER EXTREMITIES:  Intact pulse, no edema.   His EKG shows no acute  changes with atrial fib flutter at a rate of 150.  Chest x-ray shows some revascularization but is unchanged from  yesterday,  it is read as pulmonary edema.   IMPRESSION:  The patient's chest heaviness could certainly be angina given  its rapid rate and an 77 year old bypass graft.  He has not had close  cardiac follow up in the last couple of years.   He is currently stable without frank pulmonary edema or desating.   He has no acute EKG changes.   For the time being we will add amiodarone 150 mg bolus with 30 mg/hr drip  for his atrial arrhythmia.  We will give him q.2h. beta-blockers and  increase his Cardizem drip to 20 mg an hour.  Hopefully his blood pressure  will be maintained with this medication and he can convert back to sinus  rhythm.   His other multiple medical problems will be dealt with by Dr. Ladona Ridgel.   I suspect that they will continue to monitor his renal function and sepsis.   The patient will be moved to the CCU.           ______________________________  Noralyn Pick. Eden Emms, MD, [facility]     PCN/MEDQ  D:  06/17/2006  T:  06/19/2006  Job:  [id number]

## 2011-02-12 NOTE — Discharge Summary (Signed)
[name], [name]             ACCOUNT NO.:  [id number]   MEDICAL RECORD NO.:  [id number]          PATIENT TYPE:  INP   LOCATION:  3315                         FACILITY:  [facility]   PHYSICIAN:  Hilda Lias, M.D.   DATE OF BIRTH:  June 28, 1932   DATE OF ADMISSION:  05/31/2006  DATE OF DISCHARGE:  07/12/2006                               DISCHARGE SUMMARY   ADMISSION DIAGNOSIS:  Status post lumbar discectomy.  Rule out L2-L3  osteomyelitis.   FINAL DIAGNOSIS:  Status post lumbar discectomy.  Rule out L2-L3  osteomyelitis.   CLINICAL HISTORY:  The patient was admitted because of back pain  radiating to the leg.  Previously, this gentleman have discectomy.  He  developed quite a bit of pain, sedimentation rate was high and because  of the pain that he was having, a decision was made to be admitted to  the [facility] for further care.   LABORATORY DATA:  Showed that his sedimentation was elevated.  He was  seen by the pulmonologist, as well as the cardiologist, plus infectious  disease.  He had MRIs, which showed bone destruction at the L2 and 3.  The numbers of the laboratory are not available in the patient's chart.   COURSE OF HOSPITAL:  The patient was admitted to the hospital and biopsy  was performed.  He was done by the x-ray department at the L3-L4 and  finding was recently made.  Nevertheless, the patient continued to have  pain.  He developed difficulty breathing with increase of the blood  pressure.  He was seen by the cardiologist service, as well as the  pulmonology and later on by infectious disease.  Patient was transferred  to the intensive care unit.  He was closely monitored and, nevertheless,  he continued to decrease his appetite, although he was afebrile with  malaise and he continued to have increasing pain.  Hemoglobin was 29.2,  hematocrit was 29.8.  Potassium was 3.6 and 4.5.  His blood pressure was  fair.  The patient was followed closely and  nevertheless slowly he  started to improve and eventually the pain decreased and he was  transferred to the rehab center on July 12, 2006.   CONDITION ON DISCHARGE:  Stable.   MEDICATIONS:  He will continue with the same medications.   DIET:  Regular.   ACTIVITY:  He will be at the rehab center.           ______________________________  Hilda Lias, M.D.     EB/MEDQ  D:  10/12/2006  T:  10/13/2006  Job:  [id number]

## 2011-02-12 NOTE — Consult Note (Signed)
[name], [name]             ACCOUNT NO.:  [id number]   MEDICAL RECORD NO.:  [id number]          PATIENT TYPE:  INP   LOCATION:  3315                         FACILITY:  [facility]   PHYSICIAN:  Laqueta Linden, M.D.    DATE OF BIRTH:  1932-07-31   DATE OF CONSULTATION:  07/01/2006  DATE OF DISCHARGE:                                   CONSULTATION   TITLE:  Palliative care consult.   REASON FOR CONSULTATION:  Palliative care was asked to see this 77 year old  white male for recommendations regarding pain management.  [name] was  admitted to the hospital on May 31, 2006 for sepsis related to an  infection at his operative site when he had a diskectomy in June of this  year for a ruptured disk.  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  During this hospital stay he has dealt with sepsis with  associated renal failure and underwent short-term hemodialysis and has some  residual renal insufficiency.  He has had significant issues with pain  management, virtually since his surgery.  He reports he has not had a single  pain-free day since he ruptured the disk and subsequently had surgery.  He  describes his pain as a level of 12/10.  He describes his pain as starting  in his lower back radiating around his hip into his right thigh area.  It is  described as sharp and stabbing and pulsating.  He has been on Darvocet for  many years for chronic back pain.  Since he has been hospitalized, he has  been treated with Robaxin 500 mg q.6 hours with continued Darvocet, up to 3  to 4 times, also valium 5 mg b.i.d. scheduled with 5 mg IV p.r.n., which is  used infrequently.  He has also been receiving p.r.n. IV morphine 2 mg, up  to every 2 hours, but has been noted to have respiratory suppression with a  respiratory rate dropping as low as 6 per minute if anymore than 2 mg is  given.  Yesterday, he used 5 of the p.r.n. doses and today he has used 4.  He also is on a 25 mcg  fentanyl patch.  The patient was unable to give much  of history due to being in severe pain.  However, his wife reported that  none of these medications seem to help significantly.  She was unable to  think of any medications that had worked significantly well in the past.   An additional concern is depression.  When asked directed, the patient  reported that he was a little depressed over the situation and the pain.  His wife subsequently mentioned that she thought he was significantly  depressed due to the hospitalization, his inability to walk, and the severe  pain.  She feels that this is a source of his refusing to eat.  He has been  on low-dose Lexapro for the past week and no response has been seen thus  far.   The patient's wife expressed concern regarding the need for the patient to  be in pain  to receive p.r.n. doses of medication.  She also questioned  whether he was taking medications that were not helping, that he could  discontinue.   PAST MEDICAL HISTORY:  Notable for the significant events noted during this  hospitalization.  In addition:   1. Coronary artery disease status post CABG.  2. Paroxysmal atrial fibrillation, this hospitalization.  3. Anemia.  4. Hyperglycemia.  5. Hypertension.  6. Protein-calorie malnutrition.  7. Dysphagia.   ALLERGIES:  NO KNOWN DRUG ALLERGIES.   CURRENT MEDICATIONS:  1. Amiodarone 400 mg b.i.d.  2. Rocephin 1 gram daily.  3. Valium as noted above.  4. Lovenox 80 mg subcutaneously q.12 hours.  5. Ensure t.i.d.  6. Lexapro 10 mg daily.  7. Duragesic patch 25 mcg q.72 hours.  8. Advair 1 puff b.i.d.  9. Lasix 40 mg b.i.d.  10.NovoLog and Lantus insulin.  11.Isordil 10 mg t.i.d.  12.Megace 400 mg daily.  13.Reglan 10 mg q.6 hours.  14.Metoprolol 25 mg b.i.d.  15.Protonix 40 mg daily.  16.Potassium chloride daily.  17.Coumadin.  18.Tylenol p.r.n.  19.Dulcolax p.r.n.  20.Haldol p.r.n. (never used).  21.Ativan p.r.n.   22.Narcan p.r.n.  23.Zofran 4 mg p.r.n.   SOCIAL HISTORY:  The patient is married.  He is former smoker.  Denies  alcohol abuse in previous history, but Dr. Danielle Dess reports that he has a past  history of alcohol abuse.   REVIEW OF SYSTEMS:  Notable for that noted above.  The patient currently  denies any chest pain, shortness of breath, cough, or pulmonary symptoms.  He denies and abdominal pain, nausea, or vomiting.  He had a normal bowel  movement on June 30, 2006.  No diarrhea or constipation noted.   PHYSICAL EXAMINATION:  GENERAL:  Reveals an elderly white male in obvious  distress.  He is trembling in his upper extremities and his right leg and  lifting himself out of the bed asking for pain medication.  VITAL SIGNS:  Per the nurse:  Vital signs during the pain episodes have  shown a blood pressure as high as 182/116 with a pulse of 130.  After  receiving 2 mg of IV morphine followed by a second 2 mg of IV morphine,  after 20 minutes, his blood pressure, today, was 124/53, respirations 12,  pulse 80 and regular, oxygen sat 93 to 95.  SKIN:  Without lesions.  HEENT:  Grossly negative.  NECK:  Supple.  LUNGS:  Clear to auscultation.  HEART:  Regular rate and rhythm without murmurs.  ABDOMEN:  Active bowel sounds.  Soft and nondistended.  GENITOURINARY:  Deferred.  EXTREMITIES:  Muscle wasting of all extremities.  Strength within normal  limits with the exception of the right leg, which had slightly diminished  strength.  Pulses are full and equal bilaterally.  Skin is warm and dry to  the touch.  There is no peripheral edema.  Examination of the right lower  extremity is limited due to the patient's acute pain.  However, did not  reveal any hyperesthesia or focal-point tenderness.  His reflexes were  symmetrical, but appeared to be diminished.  MENTAL STATUS:  The patient was alert and oriented, but in obvious distress. He answered questions appropriately.  He did admit to  being depressed about  his current situation.   LABORATORY AND X-RAY STUDIES:  Reviewed.  Notable laboratory results include  a creatinine of 1.7 with a calculated creatinine clearance of 47 mL per  minute.   IMPRESSION:  1. Left  lower extremity pain, poorly controlled on multiple medications      with analgesic dose limited by respiratory depression.  The pain      appears to have a neuropathic component by history.  2. Depression, situational with resultant anorexia.  3. L3-L5 enterobacter osteomyelitis.  4. Renal failure secondary to sepsis, status post short-term dialysis with      residual renal insufficiency.  5. Protein-calorie malnutrition.  6. Dysphagia.  7. Coronary artery disease status post coronary artery bypass graft.  8. Paroxysmal atrial fibrillation.  9. Anemia.  10.Hyperglycemia.  11.Hypertension.   RECOMMENDATIONS:  1. Discontinue fentanyl patch as it is a minimal dose and titration of      this would take longer to achieve relief of symptoms.  2. Change morphine to hydromorphone due to renal insufficiency.  Will      initially start on a low-dose, continuous drip (0.25 mg per hour) with      nursing boluses of 0.25 to 0.5 mg q.2 hours p.r.n.  The basal rate and      bolus dose will be observed and adjusted depending the patient's      response with respect to pain and his respiratory status.  3. Add Cymbalta 30 mg daily for 7 days.  If well tolerated, increase to 60      mg daily to address both the depression and neuropathic component of      the pain.  4. Bowel regimen.  Add Senokot S at bedtime with Dulcolax suppositories of      no bowel movement in 2 days.  5. Discontinue Robaxin and Darvocet, wean the valium over the next few      day.  Continue p.r.n. Ativan for agitation.  The valium and/or Robaxin      may need to be added back in depending on muscle spasms.  But, since      the spasms and the neuropathic-type pain has persisted in spite of       these medications, it is felt that they may respond better to the      Cymbalta and/or the increased opioid dosing.  It is felt that      discontinuing the Robaxin and valium will decrease the patient's      somnolence and leave a greater window for increase of the opioids      without resultant respiratory depression.   The above recommendations were discussed with Dr. Danielle Dess, who concurred with  the plan.  Thank you for consulting palliative care.  We will continue to  follow the patient with you and make adjustments as necessary.           ______________________________  Laqueta Linden, M.D.     LKS/MEDQ  D:  07/01/2006  T:  07/02/2006  Job:  [id number]   cc:   Hilda Lias, M.D.  Stefani Dama, M.D.  [facility], [place]

## 2011-02-12 NOTE — Group Therapy Note (Signed)
NAME:  MOSI, HANNOLD                       ACCOUNT NO.:  [id number]   MEDICAL RECORD NO.:  [id number]                   PATIENT TYPE:  OBV   LOCATION:  A322                                 FACILITY:  [facility]   PHYSICIAN:  Edward L. Juanetta Gosling, M.D.             DATE OF BIRTH:  1931-11-11   DATE OF PROCEDURE:  DATE OF DISCHARGE:  03/06/2004                                   PROGRESS NOTE   PROBLEM:  Back pain.   SUBJECTIVE:  Mr. Gage was unable to undergo the MRI yesterday because of  claustrophobia.  He says that he is better today.  He is able to ambulate  some.  He feels much better and he would like to go home.   PHYSICAL EXAMINATION:  VITAL SIGNS:  Temperature 98.3; pulse 70;  respirations 20; blood pressure 152/75.  GENERAL:  He does look much more comfortable.  He is able to ambulate.   ASSESSMENT:  He is better.   PLAN:  Discharge home.  We will try to get an MRI done as an outpatient.  I  am glad that he has improved enough that he is not requiring intravenous  pain medications.      ___________________________________________                                            Oneal Deputy Juanetta Gosling, M.D.   ELH/MEDQ  D:  03/06/2004  T:  03/06/2004  Job:  [id number]

## 2011-02-12 NOTE — Op Note (Signed)
Caleb, [name]             ACCOUNT NO.:  [id number]   MEDICAL RECORD NO.:  [id number]          PATIENT TYPE:  IPS   LOCATION:  4007                         FACILITY:  [facility]   PHYSICIAN:  Hedwig Morton. Juanda Chance, MD     DATE OF BIRTH:  [birth year]   DATE OF PROCEDURE:  DATE OF DISCHARGE:                                 OPERATIVE REPORT   PROCEDURE:  PEG placement.   INDICATIONS:  This 77 year old gentleman has a history of L3-4 osteomyelitis  and diskitis secondary to Enterobacter infection.  He also has atrial  fibrillation and was on Coumadin until this was discontinued prior to  placement of a percutaneous gastrostomy.  He has been anorexic, depressed,  on Cymbalta.  His nutritional status has been protein deficient.  He has had  mild dysphagia and failure to thrive due to prolonged illness.  Panda tube  feedings have been working out very well, but because of long-term need for  nutritional support and supplemental feedings, he is undergoing placement of  percutaneous gastrostomy.  He has a resolved acute renal failure.  He also  has uncomplicated gallstones.  Permission was obtained from his wife.   ENDOSCOPE:  Olympus videoscope.   SEDATION:  Versed 12.5 mg IV, fentanyl 100 mcg IV.   FINDINGS:  Gastrostomy site was located in the left upper quadrant after  transillumination of the abdominal wall with the endoscope.  The endoscope  was passed under direct vision through the esophagus into the stomach and  through the pyloric channel into the duodenum.  It was slowly retracted and  abdominal wall was transilluminated.  The skin was infiltrated with 1%  Xylocaine with epinephrine.  A small incision was made and Seldinger needle  passed into the gastric lumen under direct vision.  A guidewire was then  placed through the Seldinger needle and pulled back with a snare, which was  placed through the endoscope.  The guidewire was then pulled through the  esophagus and outside of the  mouth.  Gastrostomy tube was then placed over  the guidewire with the tapered end first and advanced over the guidewire,  through the stomach, and through the gastrostomy opening.  The T bumper of  the gastrostomy tube was snug against the abdominal wall.  Retention disk  and adapters were placed.  Brief endoscopic exam of the esophagus, stomach,  and duodenum appeared normal.   IMPRESSION:  1. Placement of [employer] gastrostomy.  2. Normal endoscopic findings.   PLAN:  Routine orders have been written for skin care as well as antireflux  measure and tube feedings, which will be started later today.      Hedwig Morton. Juanda Chance, MD  Electronically Signed     DMB/MEDQ  D:  08/04/2006  T:  08/05/2006  Job:  [id number]   cc:   Ranelle Oyster, M.D.

## 2011-02-12 NOTE — Discharge Summary (Signed)
[name], [name]             ACCOUNT NO.:  [id number]   MEDICAL RECORD NO.:  [id number]          PATIENT TYPE:  IPS   LOCATION:  4007                         FACILITY:  [facility]   PHYSICIAN:  Ranelle Oyster, M.D.DATE OF BIRTH:  Apr 16, 1932   DATE OF ADMISSION:  07/12/2006  DATE OF DISCHARGE:  08/08/2006                                 DISCHARGE SUMMARY   DISCHARGE DIAGNOSIS:  1. L1, L2, and L4 osteomyelitis with diskitis secondary to Enterobacter      infection.  2. Failure to thrive with anorexia.  3. Congestive heart failure, compensated.  4. Paroxysmal atrial fibrillation on Amiodarone.  5. Hypoglycemia secondary to tube feeds.  6. Decubitus ulcer, stage II, right buttock and left heel.  7. Depression.  8. Spasms right lower extremity.  9. Intermittent confusion.   HISTORY OF PRESENT ILLNESS:  [name] is a 77 year old male with a  history of coronary artery disease, lumbar lam L3-L4 on June 14 for HNP for  acute radiculopathy.  He developed a wound infection requiring I&D on June  24, readmitted September 4 with recurrent Enterobacter wound infection with  osteomyelitis L3-L4 level.  ID was consulted for input and the patient  started on IV vancomycin and Rocephin initially.  Vancomycin discontinued  September 9.  Hospital course has been complicated by acute renal failure  secondary to ARBs and NSAIDs.  Nephrology was consulted and initially  patient treated with IV fluid and medication adjustment as well as transient  dialysis until renal status stabilized.  The patient developed acute  respiratory failure secondary to hypoventilation and decreased respiratory  drive secondary to narcotics.  This has been followed by critical care  medicine.  The patient's morphine was discontinued and was monitored off  narcotics initially.  The patient did develop A-fib with RVR requiring  Amiodarone and Cardizem drip.  He did have an issue of pulmonary edema  requiring IV  diuretics with improvement.  He was started on IV heparin and  has been transitioned to Coumadin for A-fib.  2D echo done showed EF of 45-  50%.  As the patient's mental status improved, therapies were initiated.  Pain control has been an issue and palliative care has been following for  pain control management.  The patient is unable to tolerate morphine and has  not had any relief with Fentanyl and is currently on Dilaudid for pain  control.  Also noted to have issues with dysphagia and is on D3 diet with  nectar thick liquids.  The most recent BUN and creatinine were 56 and 1.5  respectively.  The patient has completed IV Rocephin day 42 out of 44  currently.  Secondary to impairment in mobility and self-care, rehab  consulted for further therapies.   PAST MEDICAL HISTORY:  Significant for coronary artery disease with CABG in  1996, COPD, hypertension, lumbar decompression June 2007, chronic low back  pain, right hand tremors, and excision of sebaceous cyst June 24.   ALLERGIES:  No known drug allergies.   FAMILY HISTORY:  Positive for coronary artery disease and cancer.   SOCIAL HISTORY:  The  patient is married, was independent and working until  June 2007.  He lives in a two level home with two steps at entry.  He does  not use any alcohol.  He quit tobacco in 2002.   HOSPITAL COURSE:  [name] was admitted to rehab on July 12, 2006, for inpatient therapies to consist of PT and OT daily.  As the patient  had completed 42 out of 44 days of Rocephin, ID was consulted regarding  continuation of IV antibiotics.  Dr. Ninetta Lights recommended changing the  patient over to p.o. Cipro, renal dose, 250 mg b.i.d. secondary to his renal  insufficiency.  The patient is to continue on Cipro for a few months with  follow up with ID on an outpatient basis for monitoring of resolution of  infection and final decision regarding complete length of treatment on  Cipro.  The patient was  maintained on Coumadin for his A-fib.  The heart  rate has been monitored on a b.i.d. basis and has been controlled running  from 70s to 80s range.  [facility] has been following the patient in  terms of cardiac issues.  Initially, the patient was on daily weights with  Lasix p.o. as well as hydralazine.  The patient's renal status has been  monitored closely.  This has been variable secondary to the patient's  anorexia with poor p.o. intake.  The patient did have worsening of his renal  status with BUN rising to 27 and creatinine to 2.  His Lasix was held and  was discontinued.  Follow up chest x-ray October 30 showed heart within  normal size, slightly low volumes, but no acute pulmonary findings.  Cardiology decided on discontinuing the patient's hydralazine additionally  as weights have been stable.  Weights were changed to Monday and Thursday  weights.  The last check of weight is at 159 pounds as of November 8.   The patient has had an issue with pain control.  He has a very narrow  therapeutic margin for tolerance of pain meds.  Attempts were made to change  the patient over with OxyContin with decreasing of Dilaudid dose.  The  patient has been unable to tolerate this.  Attempts were made to use Lyrica  for neuropathy, however, the patient had some questionable lethargy on this.  The family has been concerned regarding the amount of narcotics the patient  has been using and questioned decreasing Dilaudid dose.  This was attempted,  however, patient with increase in pain levels on decreased dose of Dilaudid.  Currently, pain control is reasonable with Cymbalta 60 mg being used daily  for neuropathy, Valium 2.5 mg p.o. being used q.6h. for muscle spasm,  Dilaudid 4 mg p.o. being used q.6h. with Dilaudid 2 mg p.o. q.8h. p.r.n. for  break through pain.  Patient with pressure areas on sacrum and left heel at admission.  An air mattress is continuing with the patient encouraged  to  side lie when in bed and for left heel elevation off edge of pillows when in  bed.  Attempts were made by family to bring meals to help with the patient's  p.o. intake.  Pre-albumin on October 19 was noted to be at 21.8, this  steadily dropped with check of November 2 showing pre-albumin level at 12.4.  The family has attempted to push nutritional supplements which the patient  dislikes as well as different food items, however, patient with anorexia  even with addition of Megace.  Placement of Panda tube for nutritional  support was brought up with the family and the family and patient were  agreeable for this.  This did help boost the patient's energy level some.  However, patient with continued p.o. intake despite nocturnal tube feeds and  PEG placement was brought up with the family.  The family and patient were  agreeable for this as this would be a temporary measure until the patient's  pain control much improves and until the patient's anorexia resolved.  The  family requested [facility].  Dr. Lina Sar was consulted for input and  once the patient's INR trended downwards, a PEG was placed without  difficulty on August 04, 2006.  Currently, Coumadin has been resumed.  The  patient was started on tube feeds with bolus tube feeds to initiate with  goal rate of 1 can Jevity six times a day with 125 mL of H2O boluses past  each feeding.  The PEG site is currently clean and dry, patient with some  discomfort around PEG site currently.   The most recent check of labs from November 5 includes CBC revealing  hemoglobin 8.8, hematocrit 26.3, white count 12.2, platelets 247.  Check of  electrolytes on November 4 revealed sodium 130, potassium 4.3, chloride 98,  CO2 24, BUN 21, creatinine 1.3, glucose 194.  LFTs last October 31 revealed  AST 21, ALT 19, alkaline phos 56, T-bili 0.8.  Secondary to the patient's  continued intermittent confusion and delirium, a full workup was done for   this.  TSH levels checked were normal at 4.822.  B12 levels within normal  limits at 32 with folate 10.9.  Cortisol level 4.9.  CT head without  contrast was negative for bleed or other acute intracranial process,  atrophy, and nonspecific white matter changes noted.  Follow up CT of lumbar  spine October 31 shows progression of diskitis osteomyelitis L3-L4 with  interval loss of height at this level and some increase in circumferential  phlegmonous process including extension resulting in spinal stenosis.  Dr.  Jeral Fruit was consulted regarding input on CT.  Currently, no knew changes are  recommended.  The patient continues with Foley catheter currently secondary  to decrease in mobility.  UC of October 31 grew out 70,000 colonies of yeast  and the patient has been treated with Diflucan for this.  The patient was  noted to have issues with hyperglycemia once tube feeds were initiated. Blood sugars have been checked on t.i.d. basis and are ranging from 120s to  170s and occasional 200 range.  Lantus insulin was resumed at 5 units subcu  q.h.s.  The recommendations are to continue checking CBGs on q.i.d. basis  with titration of Lantus dose for tighter blood sugar control.   The patient's participation in therapy has been variable in part secondary  to decreased endurance level, pain control, and intermittent confusion.  He  has been unable to consistently tolerate and participate with therapy.  Currently, he is at max assist for supine to sitting, has been able to  ambulate 30 feet, total assist plus two with one person cueing for right  lower extremity advancement.  He is able to  navigate a wheelchair 50 feet  with supervision and cueing for feet and management of wheelchair.  The  patient is currently at supervision for grooming with max encouragement.  Upper extremity exercises are being done to help increase endurance and  strength.  Secondary to the amount of care needed  currently, SNF  was elected  by family, a bed is available on November 12 and the patient is to be  discharged to this facility with progressive PT and OT to continue past  discharge.   DISCHARGE MEDICATIONS:  Cymbalta 60 mg daily, Protonix 40 mg daily, Senokot  S 2 p.o. q.h.s., Advair 250/50 one puff b.i.d., Megace 400 mg p.o. daily,  Lopressor 25 mg b.i.d., Eucerin cream to both feet b.i.d., K-Dur 20 mEq  daily, Cipro 250 mg p.o. b.i.d., Dilaudid 4 mg p.o. q.6h., cortisone 20 mg  daily, Trazodone 75 mg p.o. q.h.s., Valium 2.5 mg p.o. q.6h., Lantus insulin  5 units subcu q.h.s., Tylenol 325 to 650 mg p.o. q.6h. p.r.n. pain, Coumadin  Dulcolax suppository one per rectum p.r.n., Crista Elliot analgesic cream to right  thigh t.i.d., Dilaudid 2 mg p.o. q.8h. p.r.n. break through pain, Jevity  tube feeds one can six times a day with 125 mL H2O flushes past each feed.   DISCHARGE INSTRUCTIONS:  Diet is regular with nutritional supplements t.i.d.  Activities as tolerated.  Encourage patient to be out of bed as much as  possible.  Wound care:  Air mattress pressure relief areas for sacrum and  heel, continue pressure relief measures with ECC cream to sacrum and Eucerin  to bilateral heels.  Special instructions:  Routine Foley catheter care, CBG  monitoring on q.i.d. basis with sliding scale insulin to keep blood sugars  under control and adjust Lantus insulin as needed, continue to encourage  p.o. intake, progressive PT/OT to continue past discharge.   FOLLOW UP:  The patient is to follow up with ID in the next 2-3 weeks for  recheck and for input regarding duration of Cipro.  Follow up with Dr.  Jeral Fruit for routine monitoring on diskitis and osteomyelitis.  Follow up with  cardiology for issues regarding A-fib and CHF.  Follow up with LMD for  medical issues.  Follow up with Dr. Riley Kill as needed.      Greg Cutter, P.A.      Ranelle Oyster, M.D.  Electronically Signed    PP/MEDQ  D:  08/05/2006   T:  08/05/2006  Job:  [id number]   cc:   Ramon Dredge L. Juanetta Gosling, M.D.  Lacretia Leigh. Ninetta Lights, M.D.  Hilda Lias, M.D.  Noralyn Pick. Eden Emms, MD, [facility]

## 2011-02-12 NOTE — Procedures (Signed)
[facility]  Patient:    [name], [name] Visit Number: [id number] MRN: [id number]          Service Type: OUT Location: RAD Attending Physician:  Cain Sieve Dictated by:   Jacolyn Reedy, P.A.C. Proc. Date: 09/06/01 Admit Date:  09/06/2001   CC:         Hebert Soho, M.D.   Stress Test  INDICATION:  Patient with history of CABG in 1996 who needs inguinal hernia repair.  He has no cardiac complaints.  DESCRIPTION OF PROCEDURE:  Baseline EKG:  Normal sinus rhythm with nonspecific ST changes.  Patient exercised 7 minutes 52 seconds of the Bruce protocol, obtaining a heart rate of 138.  Target heart rate was 138.  He complained of shortness of breath and fatigue, for which the test was stopped.  He had no chest pain.  He did develop 2-mm ST depression inferolaterally that slowly improved during recovery at about 12 minutes.  Cardiolite images are to follow. Dictated by:   Jacolyn Reedy, P.A.C. Attending Physician:  Cain Sieve DD:  09/06/01 TD:  09/06/01 Job: [id number]

## 2011-02-12 NOTE — Group Therapy Note (Signed)
NAMEELROY, [name]             ACCOUNT NO.:  [id number]   MEDICAL RECORD NO.:  [id number]          PATIENT TYPE:  ORB   LOCATION:  S159                          FACILITY:  [facility]   PHYSICIAN:  Edward L. Juanetta Gosling, M.D.DATE OF BIRTH:  1932/01/07   DATE OF PROCEDURE:  09/05/2006  DATE OF DISCHARGE:                                 PROGRESS NOTE   Mr. Holness is overall about the same.  His wife is concerned about his  pain medication use and I am trying to decrease his pain medication and  going very slowly because he has had severe pain.  He has had a severe  medical illness with acute renal failure, acute respiratory failure, and  atrial fibrillation with rapid ventricular response, pulmonary edema,  and he is requiring tube feedings although he ate a little bit today.  He has coronary disease with bypass grafting, COPD, hypertension,  chronic back pain, and had a lumbar laminectomy.  His exam shows that he  is more alert.  He is awake,  complaining of some pain.  His chest is  clear.  His heart is regular.  His abdomen is soft.  Tube feedings  continue.  My assessment then is that he has multiple medical problems  including coronary disease that is pretty well-controlled, atrial  fibrillation-he is not in atrial fibrillation now, malnutrition being  treated with tube feedings as well as oral feedings,  surgery for a  lumbar laminectomy which has caused him to have severe pain, and a  severe medical illness following his surgery.  Plan is to continue  trying very slowly to reduce his pain medication, continue with  everything else, and follow.      Edward L. Juanetta Gosling, M.D.  Electronically Signed     ELH/MEDQ  D:  09/05/2006  T:  09/05/2006  Job:  [id number]

## 2011-02-12 NOTE — Consult Note (Signed)
[name], [name]             ACCOUNT NO.:  [id number]   MEDICAL RECORD NO.:  [id number]          PATIENT TYPE:  INP   LOCATION:  2921                         FACILITY:  [facility]   PHYSICIAN:  Corinna L. Lendell Caprice, MDDATE OF BIRTH:  07/27/1932   DATE OF CONSULTATION:  06/16/2006  DATE OF DISCHARGE:                                   CONSULTATION   REASON FOR CONSULTATION:  Management of medical problems.   IMPRESSIONS/RECOMMENDATIONS:  1. Paroxysmal atrial fibrillation:  The patient most likely went into      atrial fibrillation due to his infection and respiratory failure but is      not currently on the monitor.  I recommend keeping him on telemetry for      now.  He was on a beta blocker as an outpatient and is on short-acting      Cardizem.  I will switch this to long-acting for now.  He may      eventually be able to come off the Cardizem, I suspect that the atrial      fibrillation was transient.  2. Anemia:  Probably multifactorial, infection, chronic disease, multiple      phlebotomies, but he is on subcutaneous heparin and I recommend      hemocculting his stools and checking iron indices.  3. Coronary artery disease, coronary artery bypass graft, stable.      Echocardiogram during this hospitalization showed ejection fraction of      45%, apparently he had some pulmonary edema which may have been related      to his renal failure.  4. Hyperglycemia:  No history of diabetes.  Most likely this is stress      related but I will check hemoglobin A1c and monitor.  5. Renal failure requiring short term hemodialysis. This is improving and      been followed by nephrology.  6. L3-L4 Enterobacter osteomyelitis.  7. Resolved delirium.  8. Resolved respiratory failure.  9. Peripheral vascular disease.  10.Protein calorie malnutrition:  Apparently, according to his      granddaughter, his appetite is slowly improving but is still not      optimal. He is getting Resource beverages  twice a day.   HISTORY OF PRESENT ILLNESS:  [name] is a 77 year old white male who had  a L3-L4 laminectomy for an acute herniated disk with radiculopathy and  weakness on June 14.  He subsequently had a wound infection and was  readmitted on September 4 for increased back pain.  He had aspiration of the  disk area by interventional radiology and Enterobacter grew out. Infectious  disease has been following and has managed his antibiotics which were  started apparently on admission. He, up until recently, was continuing to  have fevers and was covered for a short time on Zosyn for possibly  aspiration pneumonia.  This is been changed back to Rocephin today.  He had  renal failure which required hemodialysis.  This was felt to be secondary to  angiotensin receptor blockers, nonsteroidal anti-inflammatories in the  setting of hypotension, and has been improving.  He also  had problems with  hypoxic respiratory failure and required transfer to the intensive care  unit.  Luckily, he had did not require intubation but, according to the  chart, it was felt that altered mental status from pain medications could  have contributed to hypoventilation in addition to the pulmonary edema. He  also had episodes of atrial fibrillation with rapid ventricular response  which responded to IV Cardizem. This has since been switched to short-acting  oral Cardizem. He also has been covered with sliding scale insulin for  hyperglycemia. Today, he is reporting that his back pain is worse than what  it was upon admission. According to his granddaughter, however, his mental  status is about back to normal.  He reportedly had some shortness of breath  last night but has none now.   PAST MEDICAL HISTORY:  Coronary artery disease, hypertension, peripheral  vascular disease, questionable history of COPD per the chart, although the  patient denies this.   MEDICATIONS:  Currently he is on metoprolol 25 mg p.o.  b.i.d., Colace 100 mg  p.o. t.i.d., sliding scale insulin, heparin 5000 units subcutaneously q.8h.,  Protonix 40 mg a day, Cardizem 30 mg p.o. q.6h., Rocephin.   SOCIAL:  History the patient is married.  He stopped smoking five years ago.  Denies drinking.   FAMILY HISTORY:  Noncontributory.   REVIEW OF SYSTEMS:  As above, otherwise, negative.   PHYSICAL EXAMINATION:  VITAL SIGNS:  Temperature is 96.9, pulse 83, blood pressure 111/60,  respiratory rate 16, oxygen saturation 95% on room air.  GENERAL:  The patient is well-nourished, well-developed in no acute  distress.  HEENT:  Normocephalic, atraumatic.  Pupils equal, round, reactive to light.  Sclera nonicteric.  Moist mucous membranes.  NECK:  He has a dialysis catheter in his right IJ. No carotid bruits.  No  thyromegaly.  No JVD.  LUNGS:  Clear to auscultation anteriorly without wheezes, rhonchi or rales.  CARDIOVASCULAR:  Regular rate and rhythm without murmurs, gallops or rubs.  ABDOMEN:  Normal bowel sounds, soft, nontender, nondistended.  GU AND RECTAL:  Deferred.  EXTREMITIES:  No clubbing, cyanosis or edema.  SKIN:  No rash.  PSYCHIATRIC:  Normal affect.  NEUROLOGIC:  Alert and oriented.  Cranial nerves are intact.  Motor strength  is 5/5 in the upper extremities, 5/5 on the left leg, 4/5 right leg.   LABORATORY DATA:  On admission, his white blood cell count was 16,000,  hemoglobin was 11.7, hematocrit 34.5.  His hemoglobin is down to 9.1 today,  hematocrit 26, MCV 81.  His creatinine on admission was 1.7 and peaked at  7.2 on September 14.  Today, his creatinine is 4, BUN 47, sodium 140,  potassium 3.2, chloride 101, bicarbonate 25, albumin 1.8, calcium 8,  phosphorus 5.  TSH was 0.97.  C. diff negative.  PT/PTT unremarkable.  Cardiac enzymes essentially negative.  UA on admission was negative for  nitrite, leukocyte esterase.  There was large bilirubin and 250 glucose. Wound culture on admission grew out  Enterobacter which was sensitive to  everything but Ancef and cefotetan.  He has had multiple negative blood  cultures and urine cultures since then.  Chest x-ray on admission was  negative.Cathlean Sauer of the hips showed nothing acute. Lumbar spine showed disk  disease which has increased since 2005.  Bone scan showed diskitis and  osteomyelitis at L3-L4.  He had serial chest x-rays which on the 16th showed  developing air space disease and subsequent  worsening of pulmonary edema.  He had a renal ultrasound which showed no hydronephrosis, gallstones,  hypoechoic lesion in the right lobe of the liver, prominent soft tissue  along the base of the bladder which may be due to marked prostate  enlargement but cannot exclude bladder mass.  CT of the abdomen and pelvis  showed nothing acute, gallstones, tiny indeterminate left lower lobe lung  nodule, recommend CAT scan in 9-12 months, small bilateral pleural  effusions, right upper pole renal lesion most likely a cyst, fatty liver,  likely bladder outlet obstruction due to moderate prostamegaly, query fecal  impaction. Echocardiogram showed ejection fraction of 45-50%, hypokinesis of  the septal wall, mildly increased left ventricular wall thickness, mild  mitral valvular regurgitation, mildly dilated right atrium, and mildly  dilated right ventricle. EKG on admission showed normal sinus rhythm and at  one point showed sinus tachycardia. Doppler of the leg showed no DVT.   I would like to thank Dr. Ninetta Lights and Dr. Jeral Fruit for this consultation.  Further recommendations to follows.      Corinna L. Lendell Caprice, MD  Electronically Signed     CLS/MEDQ  D:  06/16/2006  T:  06/18/2006  Job:  [id number]

## 2011-02-12 NOTE — H&P (Signed)
[name], [name]             ACCOUNT NO.:  [id number]   MEDICAL RECORD NO.:  [id number]          PATIENT TYPE:  INP   LOCATION:  A331                          FACILITY:  [facility]   PHYSICIAN:  Edward L. Juanetta Gosling, M.D.DATE OF BIRTH:  15-Jul-1932   DATE OF ADMISSION:  03/19/2006  DATE OF DISCHARGE:  LH                                HISTORY & PHYSICAL   REASON FOR ADMISSION:  Febrile illness.   HISTORY:  [name] is a 77 year old who came to the emergency room  because of fever.  He has been having fever and chills for the last 12  hours.  He has had some cough.  He has also has had frequent urination.  He  said some cough but not a great deal.  He ha had some increased urinary  symptoms as well.  Of note is the fact that he is now 9 days out from a  right L3-4 laminectomy with removal of a large fragment.  He had been doing  fairly well.  He has noted that he has what looks like an infected sebaceous  cyst on his back that is draining purulent material.  He has been urinating  a lot.  He has had chills.  He has had some cough.  He has had some back  pain.  He has not had any drainage from his incision site.   PAST MEDICAL HISTORY:  1.  Hypertension.  2.  Coronary occlusive disease, status post bypass grafting.  3.  COPD.  4.  Peripheral vascular disease in the left lower extremity.  5.  Hyperlipidemia.  6.  Hernia repair.  7.  Laminectomy.   FAMILY HISTORY:  His mother died of coronary disease in her 37s.  His father  had stomach cancer and died in his 35s.  There are multiple family members  with coronary disease.   SOCIAL HISTORY:  He is retired.  He has been stopped smoking now for about 5  years.  He does not drink any alcohol.   REVIEW OF SYSTEMS:  Otherwise pretty much negative.   PHYSICAL EXAMINATION:  GENERAL:  He looks acutely ill.  He complains of  pain.  He is somewhat diaphoretic.  HEENT:  His pupils are reactive.  His nose and throat are clear.  NECK:   Supple without masses.  CHEST:  Clear without wheezes, rales or rhonchi.  CARDIAC:  Regular without gallop.  ABDOMEN:  Soft.  No masses felt.  EXTREMITIES:  No edema.  He has a well-healing scar on his lumbar spine and  of about 4 inches above, that he has a 3 cm what looks like sebaceous cyst  that is draining purulent material.   His urine shows 3-7 white cells and 11-20 red cells.  BMET shows his sodium  is 129, potassium 3.4, glucose is 241.  He has not been  diabetic.  His CBC  shows white count 10,400, hemoglobin 15.9.  Chest x-ray shows atelectasis,  previous coronary artery bypass grafting.   ASSESSMENT:  The concerns are, of course, he may have any of a number of  sites  of infection.  The most worrisome would be if he has infection in his  wound, in which case he will need to be transferred back to his  neurosurgeon.  Otherwise, will add antibiotics, get cultures and treat him  for staph because of the draining abscess.      Edward L. Juanetta Gosling, M.D.  Electronically Signed     ELH/MEDQ  D:  03/19/2006  T:  03/19/2006  Job:  [id number]

## 2011-02-12 NOTE — Op Note (Signed)
NAMEDAIN, LASETER             ACCOUNT NO.:  [id number]   MEDICAL RECORD NO.:  [id number]          PATIENT TYPE:  INP   LOCATION:  3110                         FACILITY:  [facility]   PHYSICIAN:  Clydene Fake, M.D.  DATE OF BIRTH:  [birth year]   DATE OF PROCEDURE:  03/20/2006  DATE OF DISCHARGE:                                 OPERATIVE REPORT   DIAGNOSIS:  Lumbar wound infection.   POSTOPERATIVE DIAGNOSES:  1.  Lumbar wound infection.  2.  Separate sebaceous cyst.   PROCEDURES:  1.  I&D of lumbar wound with Wound-V.A.C. placement.  2.  I&D of sebaceous cyst with packing with iodoform.   SURGEON:  Clydene Fake, M.D.   ANESTHESIA:  General endotracheal tube.   ESTIMATED BLOOD LOSS:  Minimal.   BLOOD GIVEN:  None.   COMPLICATIONS:  None.   REASON FOR PROCEDURE:  The patient is a 77 year old gentleman who 10 days  ago underwent a right L3-4 lumbar diskectomy and just 2 days ago started to  have some drainage from the top of the incision.  He became febrile, feeling  ill, and went to the hospital and was admitted for sepsis yesterday and  transferred to [facility] today, where critical care has been consulted.  The patient has been slightly hypotensive, treated with fluids.  He is on  antibiotics and MRI was done of the lumbar spine, which showed subcutaneous  fluid collection but no epidural spread, no enhancement of the bones or disk  space.  The patient was brought in for I&D of the wound.   PROCEDURE IN DETAIL:  The patient was brought into the operating room,  general anesthesia induced.  The patient was placed in the prone position  and all pressure points padded.  The patient was prepped and draped in a  sterile fashion and incision was then opened at the site of the previous  incision.  Immediately purulent material was seen and aerobic, anaerobic  cultures and Gram stain were performed and sent to the lab.  The wound was  then explored.  The fascial layer was  actually opened and the collection  tracked down the lamina toward the dura, and we evacuated the fluid.  We  irrigated with antibiotic solution and used the Pulse-Evac to the  subcutaneous tissue and the fascial layers and then did just antibiotic  irrigation of the deep tissues.  There was no necrotic tissue at the edges  for debridement.  The incision looked pretty good after the drainage and  irrigation.  The Wound-V.A.C. sponge was cut to size, packed in the  incision, and the Wound-V.A.C. dressing placed.  There was a large sebaceous  cyst that was  indurated and was painful prior to surgery.  This was I&D'd by incising it,  irrigating with antibiotic solution.  It was just a small skin sebaceous  cyst.  Then packed with iodoform, dressing placed.  The patient was then  placed back to a supine position, awoken from anesthesia and transferred to  recovery room in stable condition.  ______________________________  Clydene Fake, M.D.     JRH/MEDQ  D:  03/20/2006  T:  03/21/2006  Job:  [id number]

## 2011-02-18 ENCOUNTER — Ambulatory Visit (INDEPENDENT_AMBULATORY_CARE_PROVIDER_SITE_OTHER): Payer: Medicare Other | Admitting: *Deleted

## 2011-02-18 DIAGNOSIS — Z7901 Long term (current) use of anticoagulants: Secondary | ICD-10-CM

## 2011-02-18 DIAGNOSIS — I4891 Unspecified atrial fibrillation: Secondary | ICD-10-CM

## 2011-03-18 ENCOUNTER — Ambulatory Visit (INDEPENDENT_AMBULATORY_CARE_PROVIDER_SITE_OTHER): Payer: Medicare Other | Admitting: *Deleted

## 2011-03-18 DIAGNOSIS — I4891 Unspecified atrial fibrillation: Secondary | ICD-10-CM

## 2011-03-18 DIAGNOSIS — Z7901 Long term (current) use of anticoagulants: Secondary | ICD-10-CM

## 2011-03-18 LAB — POCT INR: INR: 2.3

## 2011-04-15 ENCOUNTER — Ambulatory Visit (INDEPENDENT_AMBULATORY_CARE_PROVIDER_SITE_OTHER): Payer: Medicare Other | Admitting: *Deleted

## 2011-04-15 DIAGNOSIS — Z7901 Long term (current) use of anticoagulants: Secondary | ICD-10-CM

## 2011-04-15 DIAGNOSIS — I4891 Unspecified atrial fibrillation: Secondary | ICD-10-CM

## 2011-04-15 LAB — POCT INR: INR: 2

## 2011-04-28 ENCOUNTER — Other Ambulatory Visit: Payer: Self-pay | Admitting: Internal Medicine

## 2011-05-13 ENCOUNTER — Ambulatory Visit (INDEPENDENT_AMBULATORY_CARE_PROVIDER_SITE_OTHER): Payer: Medicare Other | Admitting: *Deleted

## 2011-05-13 DIAGNOSIS — I4891 Unspecified atrial fibrillation: Secondary | ICD-10-CM

## 2011-05-13 DIAGNOSIS — Z7901 Long term (current) use of anticoagulants: Secondary | ICD-10-CM

## 2011-05-13 LAB — POCT INR: INR: 1.9

## 2011-05-14 ENCOUNTER — Encounter: Payer: Self-pay | Admitting: Vascular Surgery

## 2011-06-08 ENCOUNTER — Encounter: Payer: Self-pay | Admitting: Vascular Surgery

## 2011-06-09 ENCOUNTER — Other Ambulatory Visit (INDEPENDENT_AMBULATORY_CARE_PROVIDER_SITE_OTHER): Payer: Medicare Other | Admitting: *Deleted

## 2011-06-09 ENCOUNTER — Ambulatory Visit (INDEPENDENT_AMBULATORY_CARE_PROVIDER_SITE_OTHER): Payer: Medicare Other | Admitting: Vascular Surgery

## 2011-06-09 ENCOUNTER — Other Ambulatory Visit: Payer: Medicare Other

## 2011-06-09 ENCOUNTER — Ambulatory Visit: Payer: Medicare Other | Admitting: Vascular Surgery

## 2011-06-09 ENCOUNTER — Encounter: Payer: Self-pay | Admitting: Vascular Surgery

## 2011-06-09 VITALS — BP 155/70 | HR 67 | Resp 20 | Ht 72.0 in | Wt 210.4 lb

## 2011-06-09 DIAGNOSIS — I6529 Occlusion and stenosis of unspecified carotid artery: Secondary | ICD-10-CM

## 2011-06-09 NOTE — Progress Notes (Signed)
CC: Followup of carotid disease  HPI: [name] is a 77 y.o. male who I originally saw in consultation in March of 2012 with a 60-79% right carotid stenosis. He comes in for a 6 month followup visit. Since I saw him last he said no history of stroke, TIAs, expressive or receptive aphasia, or amaurosis fugax.  Patient has a history of lumbar disc disease and has had stable chronic low back pain. He has hypertension which is well-controlled on his current medications. He also has a history of atrial fibrillation but has had no recent problems associated with this. Past Medical History  Diagnosis Date  . Hypertension   . COPD (chronic obstructive pulmonary disease)   . Myocardial infarction   . Arthritis   . Atrial fibrillation    SOCIAL HISTORY: History  Substance Use Topics  . Smoking status: Former Smoker -- 2.0 packs/day for 40 years    Types: Cigarettes    Quit date: 09/27/1990  . Smokeless tobacco: Not on file  . Alcohol Use: No    REVIEW OF SYSTEMS: CONSTITUTIONAL:  [ ]  fever   [ ]  chills CARDIOVASCULAR: [ ]  chest pain   [ ]  chest pressure   [ ]  palpitations   [ ]  orthopnea [ ]  dyspnea on exertion   [ ]  claudication   [ ]  rest pain   [ ]  DVT   [ ]  phlebitis.  PHYSICAL EXAM: Filed Vitals:   06/09/11 1700  BP: 155/70  Pulse:   Resp:    Body mass index is 28.54 kg/(m^2).  GENERAL: The patient appears their stated age. The vital signs are documented above. CARDIOVASCULAR: There is a regular rate and rhythm without significant murmur appreciated. I do not detect any carotid bruits. Palpable femoral pulses are palpable left talus pedis pulse. PULMONARY: There is good air exchange bilaterally without wheezing or rales. NEUROLOGIC: No focal weakness or paresthesias are detected. SKIN: There are no ulcers or rashes noted.  DATA: I have independently interpreted his carotid duplex scan which shows a 60-79% right carotid stenosis with no significant stenosis on the left.  No vertebral arteries are patent with antegrade flow.  MEDICAL ISSUES: He understands we would not consider carotid endarterectomy unless the stenosis on the right progressed to greater than 80% or he developed new neurologic symptoms. Ordered a followup carotid duplex scan in 6 months. I on seeing him back in one year and last is any change in his duplex in 6 months. As noted continue taking his aspirin.

## 2011-06-10 ENCOUNTER — Ambulatory Visit (INDEPENDENT_AMBULATORY_CARE_PROVIDER_SITE_OTHER): Payer: Medicare Other | Admitting: *Deleted

## 2011-06-10 DIAGNOSIS — I4891 Unspecified atrial fibrillation: Secondary | ICD-10-CM

## 2011-06-10 DIAGNOSIS — Z7901 Long term (current) use of anticoagulants: Secondary | ICD-10-CM

## 2011-06-10 LAB — POCT INR: INR: 2.4

## 2011-06-23 NOTE — Procedures (Unsigned)
CAROTID DUPLEX EXAM  INDICATION:  Carotid disease.  HISTORY: Diabetes:  No. Cardiac:  MI. Hypertension:  Yes. Smoking:  Previous. Previous Surgery:  No. CV History:  Currently asymptomatic. Amaurosis Fugax No, Paresthesias No, Hemiparesis No.                                      RIGHT             LEFT Brachial systolic pressure:         126               124 Brachial Doppler waveforms:         Normal            Normal Vertebral direction of flow:        Antegrade         Antegrade DUPLEX VELOCITIES (cm/sec) CCA peak systolic                   95                125 ECA peak systolic                   81                201 ICA peak systolic                   313               122 ICA end diastolic                   84                24 PLAQUE MORPHOLOGY:                  Heterogenous      Mixed PLAQUE AMOUNT:                      Moderate/severe   Moderate PLAQUE LOCATION:                    ICA/ECA/CCA       ICA/ECA/CCA  IMPRESSION: 1. Doppler velocities suggest a high-end 60% to 79% stenosis of the     right proximal internal carotid artery. 2. No hemodynamically significant stenosis of the left proximal     internal carotid artery with plaque formations as described above. 3. Left external carotid artery stenosis noted.  ___________________________________________ Di Kindle. Edilia Bo, M.D.  CH/MEDQ  D:  06/10/2011  T:  06/10/2011  Job:  [id number]

## 2011-06-25 LAB — BASIC METABOLIC PANEL
Calcium: 9
GFR calc Af Amer: 57 — ABNORMAL LOW
GFR calc non Af Amer: 47 — ABNORMAL LOW
Sodium: 141

## 2011-06-25 LAB — PROTIME-INR
INR: 2.3 — ABNORMAL HIGH
INR: 2.9 — ABNORMAL HIGH
Prothrombin Time: 29 — ABNORMAL HIGH
Prothrombin Time: 32.5 — ABNORMAL HIGH

## 2011-06-25 LAB — CBC
Hemoglobin: 14.1
RBC: 4.87
WBC: 6.7

## 2011-07-08 ENCOUNTER — Ambulatory Visit (INDEPENDENT_AMBULATORY_CARE_PROVIDER_SITE_OTHER): Payer: Medicare Other | Admitting: *Deleted

## 2011-07-08 DIAGNOSIS — I4891 Unspecified atrial fibrillation: Secondary | ICD-10-CM

## 2011-07-08 DIAGNOSIS — Z7901 Long term (current) use of anticoagulants: Secondary | ICD-10-CM

## 2011-07-08 LAB — POCT INR: INR: 3.7

## 2011-08-05 ENCOUNTER — Ambulatory Visit (INDEPENDENT_AMBULATORY_CARE_PROVIDER_SITE_OTHER): Payer: Medicare Other | Admitting: *Deleted

## 2011-08-05 DIAGNOSIS — I4891 Unspecified atrial fibrillation: Secondary | ICD-10-CM

## 2011-08-05 DIAGNOSIS — Z7901 Long term (current) use of anticoagulants: Secondary | ICD-10-CM

## 2011-09-06 ENCOUNTER — Ambulatory Visit (INDEPENDENT_AMBULATORY_CARE_PROVIDER_SITE_OTHER): Payer: Medicare Other | Admitting: *Deleted

## 2011-09-06 DIAGNOSIS — Z7901 Long term (current) use of anticoagulants: Secondary | ICD-10-CM

## 2011-09-06 DIAGNOSIS — I4891 Unspecified atrial fibrillation: Secondary | ICD-10-CM

## 2011-09-29 ENCOUNTER — Other Ambulatory Visit (HOSPITAL_COMMUNITY): Payer: Self-pay | Admitting: Pulmonary Disease

## 2011-09-29 ENCOUNTER — Ambulatory Visit (HOSPITAL_COMMUNITY)
Admission: RE | Admit: 2011-09-29 | Discharge: 2011-09-29 | Disposition: A | Discharge status: Home / Self Care | Payer: Medicare Other | Source: Ambulatory Visit | Attending: Pulmonary Disease | Admitting: Pulmonary Disease

## 2011-09-29 DIAGNOSIS — R05 Cough: Secondary | ICD-10-CM | POA: Insufficient documentation

## 2011-09-29 DIAGNOSIS — R059 Cough, unspecified: Secondary | ICD-10-CM | POA: Insufficient documentation

## 2011-10-06 ENCOUNTER — Inpatient Hospital Stay (HOSPITAL_COMMUNITY)
Admission: EM | Admit: 2011-10-06 | Discharge: 2011-10-08 | DRG: 378 | Disposition: A | Discharge status: Home Health | Payer: Medicare Other | Attending: Pulmonary Disease | Admitting: Pulmonary Disease

## 2011-10-06 ENCOUNTER — Emergency Department (HOSPITAL_COMMUNITY): Payer: Medicare Other

## 2011-10-06 ENCOUNTER — Encounter (HOSPITAL_COMMUNITY): Payer: Self-pay

## 2011-10-06 DIAGNOSIS — I739 Peripheral vascular disease, unspecified: Secondary | ICD-10-CM | POA: Diagnosis present

## 2011-10-06 DIAGNOSIS — M1990 Unspecified osteoarthritis, unspecified site: Secondary | ICD-10-CM | POA: Diagnosis present

## 2011-10-06 DIAGNOSIS — E785 Hyperlipidemia, unspecified: Secondary | ICD-10-CM | POA: Diagnosis present

## 2011-10-06 DIAGNOSIS — K921 Melena: Principal | ICD-10-CM | POA: Diagnosis present

## 2011-10-06 DIAGNOSIS — J209 Acute bronchitis, unspecified: Secondary | ICD-10-CM | POA: Diagnosis present

## 2011-10-06 DIAGNOSIS — D62 Acute posthemorrhagic anemia: Secondary | ICD-10-CM | POA: Diagnosis present

## 2011-10-06 DIAGNOSIS — D6832 Hemorrhagic disorder due to extrinsic circulating anticoagulants: Secondary | ICD-10-CM

## 2011-10-06 DIAGNOSIS — I6529 Occlusion and stenosis of unspecified carotid artery: Secondary | ICD-10-CM | POA: Diagnosis present

## 2011-10-06 DIAGNOSIS — I2510 Atherosclerotic heart disease of native coronary artery without angina pectoris: Secondary | ICD-10-CM | POA: Diagnosis present

## 2011-10-06 DIAGNOSIS — K5730 Diverticulosis of large intestine without perforation or abscess without bleeding: Secondary | ICD-10-CM | POA: Diagnosis present

## 2011-10-06 DIAGNOSIS — Z7901 Long term (current) use of anticoagulants: Secondary | ICD-10-CM | POA: Diagnosis present

## 2011-10-06 DIAGNOSIS — I4891 Unspecified atrial fibrillation: Secondary | ICD-10-CM | POA: Diagnosis present

## 2011-10-06 DIAGNOSIS — D126 Benign neoplasm of colon, unspecified: Secondary | ICD-10-CM | POA: Diagnosis present

## 2011-10-06 DIAGNOSIS — K922 Gastrointestinal hemorrhage, unspecified: Secondary | ICD-10-CM | POA: Diagnosis present

## 2011-10-06 DIAGNOSIS — T45515A Adverse effect of anticoagulants, initial encounter: Secondary | ICD-10-CM | POA: Diagnosis present

## 2011-10-06 LAB — CBC
HCT: 28.5 % — ABNORMAL LOW (ref 39.0–52.0)
MCH: 27.4 pg (ref 26.0–34.0)
MCHC: 32.6 g/dL (ref 30.0–36.0)
MCV: 84.1 fL (ref 78.0–100.0)
Platelets: 273 10*3/uL (ref 150–400)
RDW: 17.4 % — ABNORMAL HIGH (ref 11.5–15.5)
WBC: 8.3 10*3/uL (ref 4.0–10.5)

## 2011-10-06 LAB — HEMOGLOBIN AND HEMATOCRIT, BLOOD: HCT: 25.4 % — ABNORMAL LOW (ref 39.0–52.0)

## 2011-10-06 LAB — COMPREHENSIVE METABOLIC PANEL
ALT: 61 U/L — ABNORMAL HIGH (ref 0–53)
BUN: 42 mg/dL — ABNORMAL HIGH (ref 6–23)
CO2: 25 mEq/L (ref 19–32)
Calcium: 9.8 mg/dL (ref 8.4–10.5)
GFR calc Af Amer: 44 mL/min — ABNORMAL LOW (ref >90)
GFR calc non Af Amer: 38 mL/min — ABNORMAL LOW (ref >90)
Glucose, Bld: 256 mg/dL — ABNORMAL HIGH (ref 70–99)
Total Protein: 6.7 g/dL (ref 6.0–8.3)

## 2011-10-06 LAB — DIFFERENTIAL
Basophils Absolute: 0.1 10*3/uL (ref 0.0–0.1)
Eosinophils Absolute: 0.1 10*3/uL (ref 0.0–0.7)
Lymphs Abs: 1.2 10*3/uL (ref 0.7–4.0)
Monocytes Absolute: 0.9 10*3/uL (ref 0.1–1.0)
Neutro Abs: 6 10*3/uL (ref 1.7–7.7)

## 2011-10-06 LAB — CARDIAC PANEL(CRET KIN+CKTOT+MB+TROPI)
CK, MB: 2.8 ng/mL (ref 0.3–4.0)
Total CK: 53 U/L (ref 7–232)

## 2011-10-06 LAB — PROTIME-INR: Prothrombin Time: 48.5 seconds — ABNORMAL HIGH (ref 11.6–15.2)

## 2011-10-06 MED ORDER — ONDANSETRON HCL 4 MG/2ML IJ SOLN: 4.0000 mg | Freq: Four times a day (QID) | INTRAMUSCULAR | Status: DC | PRN

## 2011-10-06 MED ORDER — PANTOPRAZOLE SODIUM 40 MG IV SOLR
40.0000 mg | INTRAVENOUS | Status: DC
  Administered 2011-10-06 – 2011-10-07 (×2): 40 mg via INTRAVENOUS
  Filled 2011-10-06 (×2): qty 40

## 2011-10-06 MED ORDER — FAMOTIDINE IN NACL 20-0.9 MG/50ML-% IV SOLN
20.0000 mg | Freq: Once | INTRAVENOUS | Status: AC
  Administered 2011-10-06: 20 mg via INTRAVENOUS
  Filled 2011-10-06: qty 50

## 2011-10-06 MED ORDER — ONDANSETRON HCL 4 MG PO TABS
4.0000 mg | ORAL_TABLET | Freq: Four times a day (QID) | ORAL | Status: DC | PRN
  Administered 2011-10-07: 4 mg via ORAL
  Filled 2011-10-06: qty 1

## 2011-10-06 MED ORDER — PHYTONADIONE 5 MG PO TABS
5.0000 mg | ORAL_TABLET | Freq: Once | ORAL | Status: AC
  Administered 2011-10-06: 5 mg via ORAL
  Filled 2011-10-06: qty 1

## 2011-10-06 MED ORDER — ACETAMINOPHEN 650 MG RE SUPP: 650.0000 mg | Freq: Four times a day (QID) | RECTAL | Status: DC | PRN

## 2011-10-06 MED ORDER — METOPROLOL TARTRATE 25 MG PO TABS
25.0000 mg | ORAL_TABLET | Freq: Two times a day (BID) | ORAL | Status: DC
  Administered 2011-10-06 – 2011-10-08 (×4): 25 mg via ORAL
  Filled 2011-10-06 (×4): qty 1

## 2011-10-06 MED ORDER — INSULIN ASPART 100 UNIT/ML ~~LOC~~ SOLN
0.0000 [IU] | Freq: Three times a day (TID) | SUBCUTANEOUS | Status: DC
  Administered 2011-10-07 (×2): 3 [IU] via SUBCUTANEOUS
  Administered 2011-10-08: 2 [IU] via SUBCUTANEOUS
  Filled 2011-10-06: qty 3

## 2011-10-06 MED ORDER — AMIODARONE HCL 200 MG PO TABS
100.0000 mg | ORAL_TABLET | Freq: Every day | ORAL | Status: DC
  Administered 2011-10-07 – 2011-10-08 (×2): 100 mg via ORAL
  Filled 2011-10-06 (×2): qty 1

## 2011-10-06 MED ORDER — SILODOSIN 8 MG PO CAPS
8.0000 mg | ORAL_CAPSULE | Freq: Every day | ORAL | Status: DC
  Administered 2011-10-07 – 2011-10-08 (×2): 8 mg via ORAL
  Filled 2011-10-06 (×5): qty 1

## 2011-10-06 MED ORDER — ZOLPIDEM TARTRATE 5 MG PO TABS: 5.0000 mg | ORAL_TABLET | Freq: Every evening | ORAL | Status: DC | PRN

## 2011-10-06 MED ORDER — DIAZEPAM 5 MG PO TABS: 5.0000 mg | ORAL_TABLET | Freq: Four times a day (QID) | ORAL | Status: DC | PRN

## 2011-10-06 MED ORDER — HYDROCODONE-ACETAMINOPHEN 5-325 MG PO TABS: 1.0000 | ORAL_TABLET | ORAL | Status: DC | PRN

## 2011-10-06 MED ORDER — ALUM & MAG HYDROXIDE-SIMETH 200-200-20 MG/5ML PO SUSP: 30.0000 mL | Freq: Four times a day (QID) | ORAL | Status: DC | PRN

## 2011-10-06 MED ORDER — PHYTONADIONE 5 MG PO TABS
ORAL_TABLET | ORAL | Status: AC
  Filled 2011-10-06: qty 1

## 2011-10-06 MED ORDER — PREGABALIN 50 MG PO CAPS
50.0000 mg | ORAL_CAPSULE | Freq: Two times a day (BID) | ORAL | Status: DC
  Administered 2011-10-06 – 2011-10-08 (×4): 50 mg via ORAL
  Filled 2011-10-06 (×4): qty 1

## 2011-10-06 MED ORDER — HYDROCODONE-HOMATROPINE 5-1.5 MG/5ML PO SYRP
5.0000 mL | ORAL_SOLUTION | ORAL | Status: DC | PRN
  Administered 2011-10-07 (×3): 5 mL via ORAL
  Filled 2011-10-06 (×3): qty 5

## 2011-10-06 MED ORDER — ACETAMINOPHEN 325 MG PO TABS: 650.0000 mg | ORAL_TABLET | Freq: Four times a day (QID) | ORAL | Status: DC | PRN

## 2011-10-06 MED ORDER — AMLODIPINE BESYLATE 5 MG PO TABS
5.0000 mg | ORAL_TABLET | Freq: Every day | ORAL | Status: DC
  Administered 2011-10-06 – 2011-10-08 (×3): 5 mg via ORAL
  Filled 2011-10-06 (×3): qty 1

## 2011-10-06 MED ORDER — SODIUM CHLORIDE 0.9 % IV SOLN
INTRAVENOUS | Status: DC
  Administered 2011-10-06 – 2011-10-07 (×3): via INTRAVENOUS

## 2011-10-06 MED ORDER — PANTOPRAZOLE SODIUM 40 MG IV SOLR
40.0000 mg | Freq: Once | INTRAVENOUS | Status: AC
  Administered 2011-10-06: 40 mg via INTRAVENOUS
  Filled 2011-10-06: qty 40

## 2011-10-06 NOTE — ED Provider Notes (Addendum)
History   This chart was scribed for [employer]. Colon Branch, MD scribed by Magnus Sinning. The patient was seen in room [facility] seen at 13:55.    CSN: [id number]  Arrival date & time 10/06/11  1232   First MD Initiated Contact with Patient 10/06/11 1303      Chief Complaint  Patient presents with  . Rectal Bleeding    (Consider location/radiation/quality/duration/timing/severity/associated sxs/prior treatment) HPI JUDGE DUQUE is a 78 y.o. male who presents to the Emergency Department complaining of constant moderate melena with associated hard cough, chest tightness, and RLQ abd pain with swelling all of which began a week and a half ago. Pt says he had three back-to-back episodes of melena at home and describes it as appearing like"black color and white butter being mixed together" and his chest pain as a feeling like "someone is sitting on his chest." Per pt wife, pt has had a recent URI and reports that he was treated with several steroids and abxs, along with him taking coumadin, which he uses for his Afib. Pt reports that he has been taking Coumadin since 2007 and has a hx of hernias. Denies fevers, and n/v.  Past Medical History  Diagnosis Date  . Hypertension   . COPD (chronic obstructive pulmonary disease)   . Myocardial infarction   . Arthritis   . Atrial fibrillation     Past Surgical History  Procedure Date  . Knee surgery 1960    Right knee cartilage  . Rotator cuff repair 1990    right   . Coronary artery bypass graft 1996    4 by-pass  . Inguinal hernia repair 2000    left  . Cholecystectomy 07/2010  . Back surgery 02/2006    ruptured disk    Family History  Problem Relation Age of Onset  . Heart disease Sister   . Heart disease Brother   . Coronary artery disease Mother   . Cancer Father     stomach    History  Substance Use Topics  . Smoking status: Former Smoker -- 2.0 packs/day for 40 years    Types: Cigarettes    Quit date: 09/27/1990  .  Smokeless tobacco: Not on file  . Alcohol Use: No      Review of Systems 10 Systems reviewed and are negative for acute change except as noted in the HPI. Allergies  Statins  Home Medications   Current Outpatient Rx  Name Route Sig Dispense Refill  . AMIODARONE HCL 200 MG PO TABS Oral Take 100 mg by mouth daily.      Marland Kitchen AMLODIPINE BESYLATE 5 MG PO TABS  TAKE ONE TABLET BY MOUTH EVERY DAY FOR BLOOD PRESSURE 90 tablet 1  . ASPIRIN 81 MG PO TABS Oral Take 81 mg by mouth daily.      Marland Kitchen DIAZEPAM 5 MG PO TABS Oral Take 5 mg by mouth every 6 (six) hours as needed.      Marland Kitchen HYDROCODONE-ACETAMINOPHEN 5-325 MG PO TABS Oral Take 1 tablet by mouth every 6 (six) hours as needed.      Marland Kitchen METFORMIN HCL ER (MOD) 500 MG PO TB24 Oral Take 500 mg by mouth daily with breakfast.      . METOPROLOL TARTRATE 25 MG PO TABS Oral Take 25 mg by mouth 2 (two) times daily.      Marland Kitchen ONE-DAILY MULTI VITAMINS PO TABS Oral Take 1 tablet by mouth daily.      Marland Kitchen PREGABALIN 50 MG PO CAPS  Oral Take 50 mg by mouth 2 (two) times daily.      . RED YEAST RICE 600 MG PO TABS Oral Take 1 tablet by mouth daily.      Marland Kitchen SILODOSIN 8 MG PO CAPS Oral Take 8 mg by mouth daily with breakfast.      . WARFARIN SODIUM 5 MG PO TABS Oral Take by mouth as directed.        BP 126/49  Pulse 76  Temp(Src) 97.8 F (36.6 C) (Oral)  Resp 20  Ht 5\' 11"  (1.803 m)  Wt 220 lb (99.791 kg)  BMI 30.68 kg/m2  SpO2 96%  Physical Exam  Nursing note and vitals reviewed. Constitutional: He is oriented to person, place, and time. He appears well-developed and well-nourished. No distress.  HENT:  Head: Normocephalic and atraumatic.  Mouth/Throat: Oropharynx is clear and moist.  Eyes: EOM are normal. Pupils are equal, round, and reactive to light.  Neck: Normal range of motion. Neck supple. No tracheal deviation present.  Cardiovascular: Normal rate, regular rhythm and normal heart sounds.  Exam reveals no gallop and no friction rub.   No murmur  heard. Pulmonary/Chest: Effort normal. No respiratory distress. He has wheezes.       Diffused expiratory wheezes and dry cough  Abdominal: Soft. Bowel sounds are normal. He exhibits distension. There is tenderness (Mild tenderness in RLQ).       Mild tenderness on both sides from coughing  Genitourinary:       Stool black, guiaic positive.  Musculoskeletal: Normal range of motion. He exhibits edema.       1+ peripheral edema   Neurological: He is alert and oriented to person, place, and time. No sensory deficit.  Skin: Skin is warm and dry.       Bruising to hands, forearms, and legs in various stages of healing.  Psychiatric: He has a normal mood and affect. His behavior is normal.    ED Course  Procedures (including critical care time) DIAGNOSTIC STUDIES: Oxygen Saturation is 96% on room air, normal by my interpretation.    COORDINATION OF CARE: Results for orders placed during the hospital encounter of 10/06/11  CBC      Component Value Range   WBC 8.3  4.0 - 10.5 (K/uL)   RBC 3.39 (*) 4.22 - 5.81 (MIL/uL)   Hemoglobin 9.3 (*) 13.0 - 17.0 (g/dL)   HCT 16.1 (*) 09.6 - 52.0 (%)   MCV 84.1  78.0 - 100.0 (fL)   MCH 27.4  26.0 - 34.0 (pg)   MCHC 32.6  30.0 - 36.0 (g/dL)   RDW 04.5 (*) 40.9 - 15.5 (%)   Platelets 273  150 - 400 (K/uL)  DIFFERENTIAL      Component Value Range   Neutrophils Relative 72  43 - 77 (%)   Lymphocytes Relative 15  12 - 46 (%)   Monocytes Relative 11  3 - 12 (%)   Eosinophils Relative 1  0 - 5 (%)   Basophils Relative 1  0 - 1 (%)   Neutro Abs 6.0  1.7 - 7.7 (K/uL)   Lymphs Abs 1.2  0.7 - 4.0 (K/uL)   Monocytes Absolute 0.9  0.1 - 1.0 (K/uL)   Eosinophils Absolute 0.1  0.0 - 0.7 (K/uL)   Basophils Absolute 0.1  0.0 - 0.1 (K/uL)   RBC Morphology TARGET CELLS     WBC Morphology MILD LEFT SHIFT (1-5% METAS, OCC MYELO, OCC BANDS)     Smear Review  LARGE PLATELETS PRESENT    COMPREHENSIVE METABOLIC PANEL      Component Value Range   Sodium 137  135 -  145 (mEq/L)   Potassium 4.5  3.5 - 5.1 (mEq/L)   Chloride 101  96 - 112 (mEq/L)   CO2 25  19 - 32 (mEq/L)   Glucose, Bld 256 (*) 70 - 99 (mg/dL)   BUN 42 (*) 6 - 23 (mg/dL)   Creatinine, Ser 1.61 (*) 0.50 - 1.35 (mg/dL)   Calcium 9.8  8.4 - 09.6 (mg/dL)   Total Protein 6.7  6.0 - 8.3 (g/dL)   Albumin 3.4 (*) 3.5 - 5.2 (g/dL)   AST 69 (*) 0 - 37 (U/L)   ALT 61 (*) 0 - 53 (U/L)   Alkaline Phosphatase 55  39 - 117 (U/L)   Total Bilirubin 0.4  0.3 - 1.2 (mg/dL)   GFR calc non Af Amer 38 (*) >90 (mL/min)   GFR calc Af Amer 44 (*) >90 (mL/min)  PROTIME-INR      Component Value Range   Prothrombin Time 48.5 (*) 11.6 - 15.2 (seconds)   INR 5.18 (*) 0.00 - 1.49   CARDIAC PANEL(CRET KIN+CKTOT+MB+TROPI)      Component Value Range   Total CK 53  7 - 232 (U/L)   CK, MB 2.8  0.3 - 4.0 (ng/mL)   Troponin I <0.30  <0.30 (ng/mL)   Relative Index RELATIVE INDEX IS INVALID  0.0 - 2.5    Dg Chest 2 View  10/06/2011  *RADIOLOGY REPORT*  Clinical Data: Cough, chest pain and shortness of breath.  CHEST - 2 VIEW  Comparison: PA and lateral chest 05/28/2010 and 09/29/2011.  Findings: The patient is status post CABG.  The lungs are clear. Heart size is normal.  No pneumothorax or pleural effusion.  IMPRESSION: No acute disease.  Original Report Authenticated By: Bernadene Bell. Maricela Curet, M.D.   Date: 10/06/2011  1307  Rate:72  Rhythm: normal sinus rhythm  QRS Axis: normal  Intervals: normal  ST/T Wave abnormalities: normal  Conduction Disutrbances:none  Narrative Interpretation:   Old EKG Reviewed: none available 82 Spoke with Dr. Juanetta Gosling who asked that we obtain cardiac makers before admitting patient. 1705 Spoke with Dr. Juanetta Gosling. He will admit patient to telemetry. He asked that I do temp orders. They are completed and on the chart.     MDM  Patient with recent cough and URI treated with two courses of antibiotics. Continues to have a cough. Has three black stools at home today after being seen in  his doctor's office for follow up for the UTI. INR is prolonged. Cardiac enzymes are negative.   I personally performed the services described in this documentation, which was scribed in my presence. The recorded information has been reviewed and considered.  I personally performed the services described in this documentation, which was scribed in my presence. The recorded information has been reviewed and considered.  CRITICAL CARE Performed by: Annamarie Dawley.   Total critical care time: 50 Critical care time was exclusive of separately billable procedures and treating other patients.  Critical care was necessary to treat or prevent imminent or life-threatening deterioration.  Critical care was time spent personally by me on the following activities: development of treatment plan with patient and/or surrogate as well as nursing, discussions with consultants, evaluation of patient's response to treatment, examination of patient, obtaining history from patient or surrogate, ordering and performing treatments and interventions, ordering and review of laboratory studies, ordering and review  of radiographic studies, pulse oximetry and re-evaluation of patient's condition.     Nicoletta Dress. Colon Branch, MD 10/06/11 1718  Nicoletta Dress. Colon Branch, MD 10/06/11 1719

## 2011-10-06 NOTE — ED Notes (Signed)
Wife states patient has had recent URI.  Reprots has taken a lot of steroids and antibiotics.  Reports pt is on coumadin and has had blood in stools since last Saturday.  Pt says stools are black.    C/O feeling pressure in chest also.

## 2011-10-06 NOTE — H&P (Signed)
[name] MRN: [id number] DOB/AGE: [birth year] 78 y.o. Primary Care Physician:Hilbert Briggs L, MD, MD Admit date: 10/06/2011 Chief Complaint: GI bleeding HPI: This is a 78 year old who's been sick for about a week. He developed cough and congestion and was found to have no evidence of pneumonia on chest x-ray. He came back to my office about 24 hours ago complaining that he had some dark stools. His initial stool was negative for blood then he brought 3 more that were all positive. He is on Coumadin. He then called today as he was having some chest tightness he was sent to the emergency room where he was found to have an INR of 5.18 despite having been off Coumadin for about 48 hours and he was anemic.  Past Medical History  Diagnosis Date  . Hypertension   . COPD (chronic obstructive pulmonary disease)   . Myocardial infarction   . Arthritis   . Atrial fibrillation    Past Surgical History  Procedure Date  . Knee surgery 1960    Right knee cartilage  . Rotator cuff repair 1990    right   . Coronary artery bypass graft 1996    4 by-pass  . Inguinal hernia repair 2000    left  . Cholecystectomy 07/2010  . Back surgery 02/2006    ruptured disk        Family History  Problem Relation Age of Onset  . Heart disease Sister   . Heart disease Brother   . Coronary artery disease Mother   . Cancer Father     stomach    Social History:  reports that he quit smoking about 21 years ago. His smoking use included Cigarettes. He has a 80 pack-year smoking history. He does not have any smokeless tobacco history on file. He reports that he does not drink alcohol or use illicit drugs.   Allergies:  Allergies  Allergen Reactions  . Statins Other (See Comments)    Causes legs to hurt.     Medications Prior to Admission  Medication Dose Route Frequency Provider Last Rate Last Dose  . 0.9 %  sodium chloride infusion   Intravenous Continuous [name], MD      .  acetaminophen (TYLENOL) tablet 650 mg  650 mg Oral Q6H PRN [name], MD       Or  . acetaminophen (TYLENOL) suppository 650 mg  650 mg Rectal Q6H PRN [name], MD      . alum & mag hydroxide-simeth (MAALOX/MYLANTA) 200-200-20 MG/5ML suspension 30 mL  30 mL Oral Q6H PRN [name], MD      . amiodarone (PACERONE) tablet 100 mg  100 mg Oral Daily [name], MD      . amLODipine (NORVASC) tablet 5 mg  5 mg Oral Daily [name], MD      . diazepam (VALIUM) tablet 5 mg  5 mg Oral Q6H PRN [name], MD      . famotidine (PEPCID) IVPB 20 mg  20 mg Intravenous Once [employer]. Colon Branch, MD 100 mL/hr at 10/06/11 1733 20 mg at 10/06/11 1733  . HYDROcodone-acetaminophen (NORCO) 5-325 MG per tablet 1-2 tablet  1-2 tablet Oral Q4H PRN [name], MD      . insulin aspart (novoLOG) injection 0-15 Units  0-15 Units Subcutaneous TID WC [name], MD      . metoprolol tartrate (LOPRESSOR) tablet 25 mg  25 mg Oral BID [name],  MD      . ondansetron (ZOFRAN) tablet 4 mg  4 mg Oral Q6H PRN [name], MD       Or  . ondansetron [facility]) injection 4 mg  4 mg Intravenous Q6H PRN [name], MD      . pantoprazole (PROTONIX) injection 40 mg  40 mg Intravenous Once [employer]. Colon Branch, MD   40 mg at 10/06/11 1733  . pantoprazole (PROTONIX) injection 40 mg  40 mg Intravenous Q24H [name], MD      . phytonadione (VITAMIN K) tablet 5 mg  5 mg Oral Once [name], MD      . pregabalin (LYRICA) capsule 50 mg  50 mg Oral BID [name], MD      . silodosin (RAPAFLO) capsule 8 mg  8 mg Oral Q breakfast [name], MD      . zolpidem [facility]) tablet 5 mg  5 mg Oral QHS PRN [name], MD       Medications Prior to Admission  Medication Sig Dispense Refill  . amLODipine (NORVASC) 5 MG tablet TAKE ONE TABLET BY MOUTH EVERY DAY FOR BLOOD PRESSURE  90 tablet  1  . aspirin 81 MG tablet Take 81 mg by mouth daily.        .  diazepam (VALIUM) 5 MG tablet Take 5 mg by mouth every 6 (six) hours as needed. Muscles Spasms      . HYDROcodone-acetaminophen (NORCO) 5-325 MG per tablet Take 1 tablet by mouth every 6 (six) hours as needed. Pain      . metoprolol tartrate (LOPRESSOR) 25 MG tablet Take 25 mg by mouth 2 (two) times daily.        . Multiple Vitamin (MULTIVITAMIN) tablet Take 1 tablet by mouth daily.        . pregabalin (LYRICA) 50 MG capsule Take 50 mg by mouth 2 (two) times daily.        . Red Yeast Rice 600 MG TABS Take 1 tablet by mouth daily.        . silodosin (RAPAFLO) 8 MG CAPS capsule Take 8 mg by mouth daily with breakfast.        . warfarin (COUMADIN) 5 MG tablet Take 5-7.5 mg by mouth as directed. Take 7.5 mg on Monday and Friday. Then takes 5mg  all other days.           FIE:PPIRJ from the symptoms mentioned above,there are no other symptoms referable to all systems reviewed.  Physical Exam: Blood pressure 170/74, pulse 71, temperature 97.7 F (36.5 C), temperature source Oral, resp. rate 18, height 5\' 11"  (1.803 m), weight 92.534 kg (204 lb), SpO2 96.00%. He is awake and alert. His pupils are reactive. His nose and throat clear. His neck is supple without masses. His chest is clear. He is coughing some. His heart is regular without gallop. His abdomen is soft with no masses. His stool is heme positive and black. His extremities showed no edema. His central nervous system examination is grossly intact. He is symptomatically stable.    Basename 10/06/11 1315  WBC 8.3  NEUTROABS 6.0  HGB 9.3*  HCT 28.5*  MCV 84.1  PLT 273    Basename 10/06/11 1315  NA 137  K 4.5  CL 101  CO2 25  GLUCOSE 256*  BUN 42*  CREATININE 1.66*  CALCIUM 9.8  MG --  lablast2(ast:2,ALT:2,alkphos:2,bilitot:2,prot:2,albumin:2)@    No results found for this or any previous visit (  from the past 240 hour(s)).   Dg Chest 2 View  10/06/2011  *RADIOLOGY REPORT*  Clinical Data: Cough, chest pain and shortness of  breath.  CHEST - 2 VIEW  Comparison: PA and lateral chest 05/28/2010 and 09/29/2011.  Findings: The patient is status post CABG.  The lungs are clear. Heart size is normal.  No pneumothorax or pleural effusion.  IMPRESSION: No acute disease.  Original Report Authenticated By: Bernadene Bell. Maricela Curet, M.D.   Dg Chest 2 View  09/29/2011  *RADIOLOGY REPORT*  Clinical Data: Cough for 3-4 days.  CHEST - 2 VIEW  Comparison: 05/28/2010  Findings: Prior median sternotomy and CABG. Heart is mildly enlarged and stable. Bibasilar scarring, left > right is stable. The lungs are otherwise clear with no focal infiltrate or sign of congestive failure noted.  No pleural fluid or significant peribronchial cuffing is seen.  Bony structures appear intact.  IMPRESSION: Stable cardiopulmonary appearance with no new worrisome focal or acute abnormality seen.  Original Report Authenticated By: Bertha Stakes, M.D.   Impression: He will be admitted to telemetry. He will have vitamin K. He'll have serial hemoglobin and hematocrit. He'll have GI consultation. He has GI bleeding he is over anticoagulated. He has multiple other medical problems as listed Active Problems:  * No active hospital problems. *      Plan: As above      Sierra Spargo L Pager [phone]  10/06/2011, 6:31 PM

## 2011-10-06 NOTE — ED Notes (Signed)
CRITICAL VALUE ALERT  Critical value received:INR-5.18  Date of notification: 10/06/11  Time of notification: 1356  Critical value read back:yes  Nurse who received alert:  Tarri Glenn RN  MD notified (1st page): Dr Colon Branch  Time of first page:  1357  MD notified (2nd page):  Time of second page:  Responding MD:  Dr Colon Branch   Time MD responded:  1357

## 2011-10-07 DIAGNOSIS — D649 Anemia, unspecified: Secondary | ICD-10-CM

## 2011-10-07 DIAGNOSIS — K921 Melena: Secondary | ICD-10-CM

## 2011-10-07 LAB — PROTIME-INR: Prothrombin Time: 40.8 seconds — ABNORMAL HIGH (ref 11.6–15.2)

## 2011-10-07 LAB — TYPE AND SCREEN
Unit division: 0
Unit division: 0

## 2011-10-07 LAB — GLUCOSE, CAPILLARY
Glucose-Capillary: 171 mg/dL — ABNORMAL HIGH (ref 70–99)
Glucose-Capillary: 146 mg/dL — ABNORMAL HIGH (ref 70–99)

## 2011-10-07 LAB — CBC
MCHC: 32.1 g/dL (ref 30.0–36.0)
MCV: 84.2 fL (ref 78.0–100.0)
Platelets: 195 10*3/uL (ref 150–400)
RDW: 17.4 % — ABNORMAL HIGH (ref 11.5–15.5)
WBC: 6 10*3/uL (ref 4.0–10.5)

## 2011-10-07 LAB — PREPARE RBC (CROSSMATCH)

## 2011-10-07 LAB — BASIC METABOLIC PANEL
Chloride: 102 mEq/L (ref 96–112)
Creatinine, Ser: 1.35 mg/dL (ref 0.50–1.35)
GFR calc Af Amer: 57 mL/min — ABNORMAL LOW (ref >90)
GFR calc non Af Amer: 49 mL/min — ABNORMAL LOW (ref >90)
Potassium: 4.5 mEq/L (ref 3.5–5.1)

## 2011-10-07 MED ORDER — ACETAMINOPHEN 325 MG PO TABS
650.0000 mg | ORAL_TABLET | Freq: Once | ORAL | Status: AC
  Administered 2011-10-07: 650 mg via ORAL
  Filled 2011-10-07: qty 2

## 2011-10-07 MED ORDER — VITAMIN K1 10 MG/ML IJ SOLN
5.0000 mg | Freq: Once | INTRAMUSCULAR | Status: AC
  Administered 2011-10-07: 5 mg via SUBCUTANEOUS
  Filled 2011-10-07: qty 1

## 2011-10-07 MED ORDER — DIPHENHYDRAMINE HCL 25 MG PO CAPS
25.0000 mg | ORAL_CAPSULE | Freq: Once | ORAL | Status: AC
  Administered 2011-10-07: 25 mg via ORAL
  Filled 2011-10-07: qty 1

## 2011-10-07 MED ORDER — PEG 3350-KCL-NABCB-NACL-NASULF 236 G PO SOLR
4000.0000 mL | Freq: Once | ORAL | Status: AC
  Administered 2011-10-07: 4000 mL via ORAL
  Filled 2011-10-07: qty 4000

## 2011-10-07 MED ORDER — FUROSEMIDE 10 MG/ML IJ SOLN
20.0000 mg | Freq: Once | INTRAMUSCULAR | Status: AC
  Administered 2011-10-07: 20 mg via INTRAVENOUS
  Filled 2011-10-07: qty 2

## 2011-10-07 NOTE — Progress Notes (Signed)
[name], [name]             ACCOUNT NO.:  [id number]  MEDICAL RECORD NO.:  [id number]  LOCATION:  A332                          FACILITY:  [facility]  PHYSICIAN:  Darvell Monteforte L. Juanetta Gosling, M.D.DATE OF BIRTH:  Jul 21, 1932  DATE OF PROCEDURE: DATE OF DISCHARGE:                                PROGRESS NOTE   [name] was admitted yesterday with some GI bleeding and elevated prothrombin time.  He has been on chronic Coumadin therapy.  I had discontinued his Coumadin about 2 days prior to admission, but he was still supratherapeutic when he came to the emergency room.  He seems to be hemodynamically stable and is not having any new problems now.  He says he is still having some blood in his stool.  He has not had any chest pain or shortness of breath.  PHYSICAL EXAMINATION:  GENERAL:  He is awake and alert.  He looks pretty comfortable. HEART:  Regular. ABDOMEN:  Soft. EXTREMITIES:  No edema. CENTRAL NERVOUS SYSTEM:  Grossly intact.  ASSESSMENT:  Then is that he has had continued problems with elevated ProTime.  He is still having some trouble with bleeding.  I have asked him to see Gastroenterology and they have gone ahead done a consultation, and I believe he is going to be scheduled for procedures. He is going to have some oral vitamin K because his prothrombin time is still elevated.  I did not change anything else today.  His hemoglobin will continue to be watched closely.     Moxon Messler L. Juanetta Gosling, M.D.     ELH/MEDQ  D:  10/07/2011  T:  10/07/2011  Job:  [id number]

## 2011-10-07 NOTE — Consult Note (Addendum)
Reason for Consult:melena Referring Physician: [name] is an 78 y.o. male.  HPI: [name] is a 78 yr old male admitted last night through the ED with c/o having black stools.  His black stools started Saturday.  He saw Dr. Juanetta Gosling this week for his melena. His Coumadin has been on hold x 3 days.  He is on Coumadin for atrial fib.  He has recently been treated for bronchitis with Prednisone IM x 2 and Prednisone dose pack and treated with a Z-pak and then Avelox.   There is no prior history of anemia. His last colonoscopy was approximately 10 yrs ago. His wife states he did have a polyp and was suppose to have a f/u colonoscopy in 5 yrs but did not. He has never undergone an EGD. He received Vitamin K 5mg  yesterday evening. His appetite has been good. No weight loss. No abdominal pain. No nausea or vomiting. Positive for melena. His stool cards were positive.  He tells me he saw bright red blood when he wiped. INR today is 4.6. Past Medical History  Diagnosis Date  . Hypertension   . COPD (chronic obstructive pulmonary disease)   . Myocardial infarction   . Arthritis   . Atrial fibrillation     Past Surgical History  Procedure Date  . Knee surgery 1960    Right knee cartilage  . Rotator cuff repair 1990    right   . Coronary artery bypass graft 1996    4 by-pass  . Inguinal hernia repair 2000    left  . Cholecystectomy 07/2010  . Back surgery 02/2006    ruptured disk    Family History  Problem Relation Age of Onset  . Heart disease Sister   . Heart disease Brother   . Coronary artery disease Mother   . Cancer Father     stomach    Social History:  reports that he quit smoking about 21 years ago. His smoking use included Cigarettes. He has a 80 pack-year smoking history. He does not have any smokeless tobacco history on file. He reports that he does not drink alcohol or use illicit drugs.  Allergies:  Allergies  Allergen Reactions  . Statins Other (See  Comments)    Causes legs to hurt.     Medications: I have reviewed the patient's current medications.  Results for orders placed during the hospital encounter of 10/06/11 (from the past 48 hour(s))  CBC     Status: Abnormal   Collection Time   10/06/11  1:15 PM      Component Value Range Comment   WBC 8.3  4.0 - 10.5 (K/uL)    RBC 3.39 (*) 4.22 - 5.81 (MIL/uL)    Hemoglobin 9.3 (*) 13.0 - 17.0 (g/dL)    HCT 40.9 (*) 81.1 - 52.0 (%)    MCV 84.1  78.0 - 100.0 (fL)    MCH 27.4  26.0 - 34.0 (pg)    MCHC 32.6  30.0 - 36.0 (g/dL)    RDW 91.4 (*) 78.2 - 15.5 (%)    Platelets 273  150 - 400 (K/uL)   DIFFERENTIAL     Status: Normal   Collection Time   10/06/11  1:15 PM      Component Value Range Comment   Neutrophils Relative 72  43 - 77 (%)    Lymphocytes Relative 15  12 - 46 (%)    Monocytes Relative 11  3 - 12 (%)    Eosinophils  Relative 1  0 - 5 (%)    Basophils Relative 1  0 - 1 (%)    Neutro Abs 6.0  1.7 - 7.7 (K/uL)    Lymphs Abs 1.2  0.7 - 4.0 (K/uL)    Monocytes Absolute 0.9  0.1 - 1.0 (K/uL)    Eosinophils Absolute 0.1  0.0 - 0.7 (K/uL)    Basophils Absolute 0.1  0.0 - 0.1 (K/uL)    RBC Morphology TARGET CELLS      WBC Morphology MILD LEFT SHIFT (1-5% METAS, OCC MYELO, OCC BANDS)      Smear Review LARGE PLATELETS PRESENT   GIANT PLATELETS SEEN  COMPREHENSIVE METABOLIC PANEL     Status: Abnormal   Collection Time   10/06/11  1:15 PM      Component Value Range Comment   Sodium 137  135 - 145 (mEq/L)    Potassium 4.5  3.5 - 5.1 (mEq/L)    Chloride 101  96 - 112 (mEq/L)    CO2 25  19 - 32 (mEq/L)    Glucose, Bld 256 (*) 70 - 99 (mg/dL)    BUN 42 (*) 6 - 23 (mg/dL)    Creatinine, Ser 1.61 (*) 0.50 - 1.35 (mg/dL)    Calcium 9.8  8.4 - 10.5 (mg/dL)    Total Protein 6.7  6.0 - 8.3 (g/dL)    Albumin 3.4 (*) 3.5 - 5.2 (g/dL)    AST 69 (*) 0 - 37 (U/L)    ALT 61 (*) 0 - 53 (U/L)    Alkaline Phosphatase 55  39 - 117 (U/L)    Total Bilirubin 0.4  0.3 - 1.2 (mg/dL)    GFR calc non  Af Amer 38 (*) >90 (mL/min)    GFR calc Af Amer 44 (*) >90 (mL/min)   PROTIME-INR     Status: Abnormal   Collection Time   10/06/11  1:15 PM      Component Value Range Comment   Prothrombin Time 48.5 (*) 11.6 - 15.2 (seconds)    INR 5.18 (*) 0.00 - 1.49    CARDIAC PANEL(CRET KIN+CKTOT+MB+TROPI)     Status: Normal   Collection Time   10/06/11  1:15 PM      Component Value Range Comment   Total CK 53  7 - 232 (U/L)    CK, MB 2.8  0.3 - 4.0 (ng/mL)    Troponin I <0.30  <0.30 (ng/mL)    Relative Index RELATIVE INDEX IS INVALID  0.0 - 2.5    HEMOGLOBIN AND HEMATOCRIT, BLOOD     Status: Abnormal   Collection Time   10/06/11  7:40 PM      Component Value Range Comment   Hemoglobin 8.1 (*) 13.0 - 17.0 (g/dL)    HCT 09.6 (*) 04.5 - 52.0 (%)   GLUCOSE, CAPILLARY     Status: Abnormal   Collection Time   10/06/11  8:51 PM      Component Value Range Comment   Glucose-Capillary 296 (*) 70 - 99 (mg/dL)   CBC     Status: Abnormal   Collection Time   10/07/11  5:10 AM      Component Value Range Comment   WBC 6.0  4.0 - 10.5 (K/uL)    RBC 2.92 (*) 4.22 - 5.81 (MIL/uL)    Hemoglobin 7.9 (*) 13.0 - 17.0 (g/dL)    HCT 40.9 (*) 81.1 - 52.0 (%)    MCV 84.2  78.0 - 100.0 (fL)    MCH 27.1  26.0 - 34.0 (pg)    MCHC 32.1  30.0 - 36.0 (g/dL)    RDW 16.1 (*) 09.6 - 15.5 (%)    Platelets 195  150 - 400 (K/uL) DELTA CHECK NOTED  BASIC METABOLIC PANEL     Status: Abnormal   Collection Time   10/07/11  5:10 AM      Component Value Range Comment   Sodium 135  135 - 145 (mEq/L)    Potassium 4.5  3.5 - 5.1 (mEq/L)    Chloride 102  96 - 112 (mEq/L)    CO2 28  19 - 32 (mEq/L)    Glucose, Bld 186 (*) 70 - 99 (mg/dL)    BUN 36 (*) 6 - 23 (mg/dL)    Creatinine, Ser 0.45  0.50 - 1.35 (mg/dL)    Calcium 9.0  8.4 - 10.5 (mg/dL)    GFR calc non Af Amer 49 (*) >90 (mL/min)    GFR calc Af Amer 57 (*) >90 (mL/min)   PROTIME-INR     Status: Abnormal   Collection Time   10/07/11  5:10 AM      Component Value Range  Comment   Prothrombin Time 40.8 (*) 11.6 - 15.2 (seconds)    INR 4.16 (*) 0.00 - 1.49    GLUCOSE, CAPILLARY     Status: Abnormal   Collection Time   10/07/11  7:29 AM      Component Value Range Comment   Glucose-Capillary 160 (*) 70 - 99 (mg/dL)     Dg Chest 2 View  4/0/9811  *RADIOLOGY REPORT*  Clinical Data: Cough, chest pain and shortness of breath.  CHEST - 2 VIEW  Comparison: PA and lateral chest 05/28/2010 and 09/29/2011.  Findings: The patient is status post CABG.  The lungs are clear. Heart size is normal.  No pneumothorax or pleural effusion.  IMPRESSION: No acute disease.  Original Report Authenticated By: Bernadene Bell. D'ALESSIO, M.D.    ROS Blood pressure 131/65, pulse 62, temperature 97.1 F (36.2 C), temperature source Oral, resp. rate 20, height 5\' 11"  (1.803 m), weight 210 lb 9.6 oz (95.528 kg), SpO2 96.00%. Physical ExamAlert and oriented. Skin warm and dry. Oral mucosa is moist.   . Sclera anicteric, conjunctivae is pink. Thyroid not enlarged. No cervical lymphadenopathy. Lungs clear. Heart regular rate and rhythm.  Abdomen is soft. Bowel sounds are positive. No hepatomegaly. No abdominal masses felt. No tenderness. Stool dark with marooned colored. Guaiac positive.   No edema to lower extremities. Patient is alert and oriented.   Assessment/Plan: Guaiac positive stools. Anemia.  Coumadin is presently on hold. PUD, AVM, colonic polyp given past hx and colonic neoplasm needs to be ruled out.  Will discuss with Dr. Karilyn Cota possible EGD/Colonoscopy.  Vitamin K 5mg  Sq.     SETZER,TERRI W 10/07/2011, 8:20 AM    GI attending note Acute GI bleed possibly from upper GI tract and the patient with supratherapeutic INR is being treated for bronchitis. He is also on low-dose ASA. He possibly has peptic ulcer disease; other etiologies less likely. Patient's last colonoscopy was in December 2002. He had colonic polyp removed and he did not return for followup exam as recommended. Options  reviewed with the patient; he would like to have EGD and colonoscopy during this admission other than having an EGD and colonoscopy at a later date. If both of these studies are negative he will need small bowel given capsule study since he needs to go back on anticoagulant. Anticipated that his  INR should be less than 2 by tomorrow morning and he should be able to undergo these studies in a.m. Patient's hemoglobin this morning was 7.9. Will proceed with transfusion with 2 units of PRBCs as discussed with Dr. Juanetta Gosling.

## 2011-10-07 NOTE — Progress Notes (Signed)
Inpatient Diabetes Program Recommendations  AACE/ADA: New Consensus Statement on Inpatient Glycemic Control (2009)  Target Ranges:  Prepandial:   less than 140 mg/dL      Peak postprandial:   less than 180 mg/dL (1-2 hours)      Critically ill patients:  140 - 180 mg/dL   Reason for Visit: History of Diabetes  Inpatient Diabetes Program Recommendations HgbA1C: Check HgbA1C to assess glycemic control

## 2011-10-07 NOTE — Progress Notes (Signed)
CARE MANAGEMENT NOTE 10/07/2011  Patient:  [name], [name]   Account Number:  [id number]  Date Initiated:  10/07/2011  Documentation initiated by:  Rosemary Holms  Subjective/Objective Assessment:   Pt admitted from home/spouse. Previously used [facility].     Action/Plan:   If pt should need blood draws, they would like to use [facility]   Anticipated DC Date:  10/08/2011   Anticipated DC Plan:  HOME/SELF CARE      DC Planning Services  CM consult      Choice offered to / List presented to:             [facility] agency  [facility]   Status of service:  In process, will continue to follow Medicare Important Message given?   (If response is "NO", the following Medicare IM given date fields will be blank) Date Medicare IM given:   Date Additional Medicare IM given:    Discharge Disposition:    Per UR Regulation:    Comments:  10/07/11 1300 Caleb Peace Leanord Hawking RN BSN

## 2011-10-07 NOTE — Progress Notes (Signed)
UR Chart Review Completed  

## 2011-10-08 ENCOUNTER — Encounter (HOSPITAL_COMMUNITY): Payer: Self-pay

## 2011-10-08 ENCOUNTER — Encounter (HOSPITAL_COMMUNITY)
Admission: EM | Disposition: A | Discharge status: Home Health | Payer: Self-pay | Source: Home / Self Care | Attending: Pulmonary Disease

## 2011-10-08 DIAGNOSIS — R791 Abnormal coagulation profile: Secondary | ICD-10-CM

## 2011-10-08 DIAGNOSIS — K6389 Other specified diseases of intestine: Secondary | ICD-10-CM

## 2011-10-08 DIAGNOSIS — D126 Benign neoplasm of colon, unspecified: Secondary | ICD-10-CM

## 2011-10-08 DIAGNOSIS — K228 Other specified diseases of esophagus: Secondary | ICD-10-CM

## 2011-10-08 DIAGNOSIS — K2960 Other gastritis without bleeding: Secondary | ICD-10-CM

## 2011-10-08 DIAGNOSIS — K5730 Diverticulosis of large intestine without perforation or abscess without bleeding: Secondary | ICD-10-CM

## 2011-10-08 HISTORY — PX: COLONOSCOPY: SHX5424

## 2011-10-08 HISTORY — PX: ESOPHAGOGASTRODUODENOSCOPY: SHX5428

## 2011-10-08 LAB — CBC
MCH: 27.8 pg (ref 26.0–34.0)
MCV: 83.3 fL (ref 78.0–100.0)
Platelets: 203 10*3/uL (ref 150–400)
RDW: 16.4 % — ABNORMAL HIGH (ref 11.5–15.5)
WBC: 6.9 10*3/uL (ref 4.0–10.5)

## 2011-10-08 LAB — BASIC METABOLIC PANEL
BUN: 26 mg/dL — ABNORMAL HIGH (ref 6–23)
Creatinine, Ser: 1.36 mg/dL — ABNORMAL HIGH (ref 0.50–1.35)
GFR calc Af Amer: 56 mL/min — ABNORMAL LOW (ref >90)
GFR calc non Af Amer: 49 mL/min — ABNORMAL LOW (ref >90)
Potassium: 4.1 mEq/L (ref 3.5–5.1)

## 2011-10-08 SURGERY — EGD (ESOPHAGOGASTRODUODENOSCOPY)
Anesthesia: Moderate Sedation

## 2011-10-08 MED ORDER — METHYLPREDNISOLONE 4 MG PO KIT: PACK | ORAL | Status: AC

## 2011-10-08 MED ORDER — MIDAZOLAM HCL 5 MG/5ML IJ SOLN
INTRAMUSCULAR | Status: DC | PRN
  Administered 2011-10-08: 2 mg via INTRAVENOUS
  Administered 2011-10-08 (×2): 1 mg via INTRAVENOUS

## 2011-10-08 MED ORDER — MEPERIDINE HCL 50 MG/ML IJ SOLN
INTRAMUSCULAR | Status: AC
  Filled 2011-10-08: qty 1

## 2011-10-08 MED ORDER — AMIODARONE HCL 200 MG PO TABS: 100.0000 mg | ORAL_TABLET | Freq: Every day | ORAL | Status: AC

## 2011-10-08 MED ORDER — MIDAZOLAM HCL 5 MG/5ML IJ SOLN
INTRAMUSCULAR | Status: AC
  Filled 2011-10-08: qty 10

## 2011-10-08 MED ORDER — CEFUROXIME AXETIL 500 MG PO TABS: 500.0000 mg | ORAL_TABLET | Freq: Two times a day (BID) | ORAL | Status: AC

## 2011-10-08 MED ORDER — STERILE WATER FOR IRRIGATION IR SOLN
Status: DC | PRN
  Administered 2011-10-08: 08:00:00

## 2011-10-08 MED ORDER — SODIUM CHLORIDE 0.9 % IV SOLN: Freq: Once | INTRAVENOUS | Status: DC

## 2011-10-08 MED ORDER — MEPERIDINE HCL 25 MG/ML IJ SOLN
INTRAMUSCULAR | Status: DC | PRN
  Administered 2011-10-08: 25 mg via INTRAVENOUS

## 2011-10-08 NOTE — Op Note (Signed)
EGD AND COLONOSCOPY  PROCEDURE REPORT  PATIENT:  [name]  MR#:  [id number] Birthdate:  September 23, 1932, 78 y.o., male Endoscopist:  Dr. Malissa Hippo, MD Referred By:  Dr. Oneal Deputy. Juanetta Gosling, MD  Procedure Date: 10/08/2011  Procedure:   EGD & Colonoscopy  Indications:  Patient is 78 year old Caucasian male who presents with acute GI bleed and anemia in the setting of supratherapeutic. INR. He has received 2 units of PRBCs and his hemoglobin 7.9  to 10.8. He is undergoing diagnostic EGD and colonoscopy. He has history of colonic polyps but did not return for followup exam is recommended. Patient's INR this morning was 1.95.            Informed Consent: Both the procedures and risks were reviewed with the patient and informed consent was obtained. Medications:  Demerol 25mg  IV Versed 4 mg IV Cetacaine spray topically for oropharyngeal anesthesia  EGD  Description of procedure:  The endoscope was introduced through the mouth and advanced to the second portion of the duodenum without difficulty or limitations. The mucosal surfaces were surveyed very carefully during advancement of the scope and upon withdrawal.  Findings:  Esophagus:  Cosa of the esophagus was normal except single erosion just proximal to GE junction without stigmata of bleed. GEJ:  44 cm  Stomach:  Stomach was empty and distended very well with insufflation. Folds in the proximal stomach were normal. Examination of mucosa revealed 2 small erosions at antrum without stigmata of bleed and antral scar. Pyloric channel was patent. Angularis fundus and cardia was examined by retroflexing the scope and was normal. Duodenum:  Normal bulbar and post bulbar mucosa  Therapeutic/Diagnostic Maneuvers Performed:  None  COLONOSCOPY Description of procedure:  After a digital rectal exam was performed, that colonoscope was advanced from the anus through the rectum and colon to the area of the cecum, ileocecal valve and appendiceal  orifice. The cecum was deeply intubated. These structures were well-seen and photographed for the record. From the level of the cecum and ileocecal valve, the scope was slowly and cautiously withdrawn. The mucosal surfaces were carefully surveyed utilizing scope tip to flexion to facilitate fold flattening as needed. The scope was pulled down into the rectum where a thorough exam including retroflexion was performed.  Findings:   Prep satisfactory. Moderate number of diverticula at sigmoid colon. 2 polyps noted at cecum. One was about 4 mm and the other one was larger approximately 8 x 12 mm. 6 small polyps noted predominantly in the transverse colon. One of  these was 15-16 mm and the other ones were smaller in diameter. Single small AV malformation at mid transverse colon. Normal rectal mucosa and anorectal junction.  Therapeutic/Diagnostic Maneuvers Performed:  Polypectomy not performed because patient's INR not in desirable range.  Complications:  None  Cecal Withdrawal Time:  9 minutes  Impression:  Multiple findings but no definite lesion identified to account for patient's GI blood loss. Single erosion at distal esophagus. 2 antral erosions with a scar but no stigmata of bleeding. Moderate sigmoid diverticulosis. Multiple polyps none of which were removed because of elevated INR but none with stigmata of bleed. 2 polyps were greater than 10 mm in diameter and others were small.  Recommendations:  Advance diet. H. pylori serology. Small bowel given capsule study on an outpatient basis. Can resume warfarin on 10/12/2011. Colonoscopy with polypectomy in 3 months.  REHMAN,NAJEEB U  10/08/2011 8:19 AM  CC: Dr. [name], MD, MD & Dr. Bonnetta Barry ref.  provider found

## 2011-10-08 NOTE — Progress Notes (Addendum)
Appointment made for pill swallow study.    Discharge instructions given and prescriptions were called in to pharmacy per patients. Both verbalize understanding of instructions. IV cath removed and intact patient tolerates well.

## 2011-10-08 NOTE — Progress Notes (Signed)
CARE MANAGEMENT NOTE 10/08/2011  Patient:  [name], [name]   Account Number:  [id number]  Date Initiated:  10/07/2011  Documentation initiated by:  Rosemary Holms  Subjective/Objective Assessment:   Pt admitted from home/spouse. Previously used [facility].     Action/Plan:   If pt should need blood draws, they would like to use [facility]   Anticipated DC Date:  10/08/2011   Anticipated DC Plan:  HOME W HOME HEALTH SERVICES      DC Planning Services  CM consult      Choice offered to / List presented to:          [facility] arranged  [facility] RN      [facility] agency  [facility]   Status of service:  Completed, signed off Medicare Important Message given?   (If response is "NO", the following Medicare IM given date fields will be blank) Date Medicare IM given:   Date Additional Medicare IM given:    Discharge Disposition:    Per UR Regulation:    Comments:  10/08/11 900 Vestal Crandall RN BSN RN to draw blood for CBC Tuesday morning.  10/07/11 1300 Layne Lebon Leanord Hawking RN BSN

## 2011-10-08 NOTE — Clinical Documentation Improvement (Signed)
Anemia Blood Loss Clarification  THIS DOCUMENT IS NOT A PERMANENT PART OF THE MEDICAL RECORD  RESPOND TO THE THIS QUERY, FOLLOW THE INSTRUCTIONS BELOW:  1. If needed, update documentation for the patient's encounter via the notes activity.  2. Access this query again and click edit on the In [employer].  3. After updating, or not, click F2 to complete all highlighted (required) fields concerning your review. Select "additional documentation in the medical record" OR "no additional documentation provided".  4. Click Sign note button.  5. The deficiency will fall out of your In Basket *Please let us know if you are not able to complete this workflow by phone or e-mail (listed below).        10/08/11  Dear Dr. Juanetta Gosling Marton Redwood  In an effort to better capture your patient's severity of illness, reflect appropriate length of stay and utilization of resources, a review of the patient medical record has revealed the following indicators.    Based on your clinical judgment, please clarify and document in a progress note and/or discharge summary the clinical condition associated with the following supporting information:  In responding to this query please exercise your independent judgment.  The fact that a query is asked, does not imply that any particular answer is desired or expected.  Possible Clinical Conditions?   " Expected Acute Blood Loss Anemia  " Acute Blood Loss Anemia  " Acute on chronic blood loss anemia  " Chronic blood loss anemia  " Precipitous drop in Hematocrit  " Other Condition________________  " Cannot Clinically Determine  Clinical Information:  Risk Factors:  Rectal bleeding  Signs and Symptoms: Melena  Diagnostics: H&H  1/9@1315 =9.3/28.5 1/9@1940 =8.1/25.4 1/10=7.9/24.6 1/11=10.8/32.4  Treatments: Transfusion: 1/10=2 units PRBC H&H monitoring EGD & Colonoscopy Scheduled outpatient capsule test  Reviewed: additional documentation in the  medical record  Thank You,  Debora T Williams RN, MSN Clinical Documentation Specialist: Office# [phone] [facility] Health Information Management [place]

## 2011-10-08 NOTE — Progress Notes (Signed)
Patient's stool not clear, but watery. Patient is scheduled for EGD and Colonoscopy at 0720 on 10/08/11. MD paged. Dr. Kendell Bane returned page. MD made aware of situation. No further orders given. Will continue to monitor patient.

## 2011-10-09 DIAGNOSIS — K922 Gastrointestinal hemorrhage, unspecified: Secondary | ICD-10-CM | POA: Diagnosis present

## 2011-10-09 DIAGNOSIS — D6832 Hemorrhagic disorder due to extrinsic circulating anticoagulants: Secondary | ICD-10-CM | POA: Diagnosis present

## 2011-10-09 NOTE — Discharge Summary (Signed)
Physician Discharge Summary  Patient ID: [name] MRN: [id number] DOB/AGE: 02-11-1932 78 y.o. Primary Care Physician:Tahnee Cifuentes L, MD, MD Admit date: 10/06/2011 Discharge date: 10/09/2011    Discharge Diagnoses:   Principal Problem:  *GI bleeding Active Problems:  DYSLIPIDEMIA  CAD  Atrial fibrillation  PVD  OSTEOARTHRITIS  CAROTID ARTERY STENOSIS  Long term current use of anticoagulant  Acute blood loss anemia  Warfarin-induced coagulopathy   acute bronchitis  Discharge Medication List as of 10/08/2011 10:23 AM    START taking these medications   Details  cefUROXime (CEFTIN) 500 MG tablet Take 1 tablet (500 mg total) by mouth 2 (two) times daily., Starting 10/08/2011, Until Mon 10/18/11, Normal    methylPREDNISolone (MEDROL, PAK,) 4 MG tablet follow package directions, Normal      CONTINUE these medications which have CHANGED   Details  amiodarone (PACERONE) 200 MG tablet Take 0.5 tablets (100 mg total) by mouth daily., Starting 10/08/2011, Until Discontinued, Normal      CONTINUE these medications which have NOT CHANGED   Details  acetaminophen (TYLENOL) 500 MG tablet Take 1,000 mg by mouth every 6 (six) hours as needed. Pain, Until Discontinued, Historical Med    amLODipine (NORVASC) 5 MG tablet TAKE ONE TABLET BY MOUTH EVERY DAY FOR BLOOD PRESSURE, Normal    aspirin 81 MG tablet Take 81 mg by mouth daily.  , Until Discontinued, Historical Med    diazepam (VALIUM) 5 MG tablet Take 5 mg by mouth every 6 (six) hours as needed. Muscles Spasms, Until Discontinued, Historical Med    HYDROcodone-acetaminophen (NORCO) 5-325 MG per tablet Take 1 tablet by mouth every 6 (six) hours as needed. Pain, Until Discontinued, Historical Med    metFORMIN (GLUCOPHAGE) 500 MG tablet Take 500 mg by mouth 2 (two) times daily with a meal., Until Discontinued, Historical Med    metoprolol tartrate (LOPRESSOR) 25 MG tablet Take 25 mg by mouth 2 (two) times daily.  , Until  Discontinued, Historical Med    Multiple Vitamin (MULTIVITAMIN) tablet Take 1 tablet by mouth daily.  , Until Discontinued, Historical Med    pregabalin (LYRICA) 50 MG capsule Take 50 mg by mouth 2 (two) times daily.  , Until Discontinued, Historical Med    Red Yeast Rice 600 MG TABS Take 1 tablet by mouth daily.  , Until Discontinued, Historical Med    silodosin (RAPAFLO) 8 MG CAPS capsule Take 8 mg by mouth daily with breakfast.  , Until Discontinued, Historical Med      STOP taking these medications     warfarin (COUMADIN) 5 MG tablet         Discharged Condition: Improved    Consults: GI  Significant Diagnostic Studies: Dg Chest 2 View  10/06/2011  *RADIOLOGY REPORT*  Clinical Data: Cough, chest pain and shortness of breath.  CHEST - 2 VIEW  Comparison: PA and lateral chest 05/28/2010 and 09/29/2011.  Findings: The patient is status post CABG.  The lungs are clear. Heart size is normal.  No pneumothorax or pleural effusion.  IMPRESSION: No acute disease.  Original Report Authenticated By: Bernadene Bell. Maricela Curet, M.D.   Dg Chest 2 View  09/29/2011  *RADIOLOGY REPORT*  Clinical Data: Cough for 3-4 days.  CHEST - 2 VIEW  Comparison: 05/28/2010  Findings: Prior median sternotomy and CABG. Heart is mildly enlarged and stable. Bibasilar scarring, left > right is stable. The lungs are otherwise clear with no focal infiltrate or sign of congestive failure noted.  No pleural fluid or significant  peribronchial cuffing is seen.  Bony structures appear intact.  IMPRESSION: Stable cardiopulmonary appearance with no new worrisome focal or acute abnormality seen.  Original Report Authenticated By: Bertha Stakes, M.D.    Lab Results: Basic Metabolic Panel:  Basename 10/08/11 0539 10/07/11 0510  NA 138 135  K 4.1 4.5  CL 100 102  CO2 29 28  GLUCOSE 159* 186*  BUN 26* 36*  CREATININE 1.36* 1.35  CALCIUM 8.5 9.0  MG -- --  PHOS -- --   Liver Function Tests:  Basename 10/06/11 1315    AST 69*  ALT 61*  ALKPHOS 55  BILITOT 0.4  PROT 6.7  ALBUMIN 3.4*     CBC:  Basename 10/08/11 0539 10/07/11 0510 10/06/11 1315  WBC 6.9 6.0 --  NEUTROABS -- -- 6.0  HGB 10.8* 7.9* --  HCT 32.4* 24.6* --  MCV 83.3 84.2 --  PLT 203 195 --    No results found for this or any previous visit (from the past 240 hour(s)).   Hospital Course: He was admitted because of GI bleeding. He was noted to have black stools. He was also coagulopathic with an INR of about 5. He had been off his Coumadin for 2 days and still had an INR of approximately 5. He complained of being weak. He had dark stools for about 2 days. He complained of some chest tightness but he felt like that was related to cough. He had been complaining of bronchitic symptoms with significant cough and congestion. He was given vitamin K and had GI consultation. His hemoglobin drifted down and he received 2 units of packed red blood cells after hemoglobin level of 7.9. He underwent upper and lower endoscopy and was found to have some polyps and a single very small area of gastritis none of which were bleeding. Since he has stopped bleeding his hemoglobin level was 10.8 and he was hemodynamically stable he was discharged after the procedure. He was told to hold his Coumadin until 10/13/2011. He was told to return if he had any further bleeding. He will be set up for a capsule study and he will need a repeat endoscopy of his colon  Discharge Exam: Blood pressure 122/61, pulse 61, temperature 97.7 F (36.5 C), temperature source Oral, resp. rate 14, height 5\' 11"  (1.803 m), weight 97.07 kg (214 lb), SpO2 97.00%. He was awake and alert hemodynamically stable and comfortable  Disposition: Home with home health  Discharge Orders    Future Appointments: Provider: Department: Dept Phone: Center:   06/07/2012 1:00 PM [facility] [phone] [facility]   06/07/2012 2:00 PM Chuck Hint, MD [facility] [phone] [facility]      Future Orders Please Complete By Expires   Home Health      Questions: Responses:   To provide the following care/treatments RN   Face-to-face encounter      Comments:   I Danene Montijo L certify that this patient is under my care and that I, or a nurse practitioner or physician's assistant working with me, had a face-to-face encounter that meets the physician face-to-face encounter requirements with this patient on 10/08/2011.       Questions: Responses:   The encounter with the patient was in whole, or in part, for the following medical condition, which is the primary reason for home health care GI bleeding   I certify that, based on my findings, the following services are medically necessary home health services Nursing   My clinical findings support  the need for the above services High Risk for rehospitalization   Further, I certify that my clinical findings support that this patient is homebound (i.e. absences from home require considerable and taxing effort and are for medical reasons or religious services or infrequently or of short duration when for other reasons) Shortness of Breath with activity   To provide the following care/treatments RN      Follow-up Information    Follow up with [name], MD .         Signed: [name] Pager [phone]  10/09/2011, 7:43 AM

## 2011-10-11 ENCOUNTER — Encounter: Payer: Medicare Other | Admitting: *Deleted

## 2011-10-11 ENCOUNTER — Encounter (HOSPITAL_COMMUNITY): Payer: Self-pay | Admitting: Internal Medicine

## 2011-10-11 LAB — H. PYLORI ANTIBODY, IGG: H Pylori IgG: 0.99 {ISR} — ABNORMAL HIGH

## 2011-10-20 ENCOUNTER — Ambulatory Visit (INDEPENDENT_AMBULATORY_CARE_PROVIDER_SITE_OTHER): Payer: Medicare Other | Admitting: *Deleted

## 2011-10-20 DIAGNOSIS — Z7901 Long term (current) use of anticoagulants: Secondary | ICD-10-CM

## 2011-10-20 DIAGNOSIS — I4891 Unspecified atrial fibrillation: Secondary | ICD-10-CM

## 2011-11-03 ENCOUNTER — Ambulatory Visit (INDEPENDENT_AMBULATORY_CARE_PROVIDER_SITE_OTHER): Payer: Medicare Other | Admitting: *Deleted

## 2011-11-03 DIAGNOSIS — I4891 Unspecified atrial fibrillation: Secondary | ICD-10-CM

## 2011-11-03 DIAGNOSIS — Z7901 Long term (current) use of anticoagulants: Secondary | ICD-10-CM

## 2011-11-08 ENCOUNTER — Other Ambulatory Visit: Payer: Self-pay | Admitting: Internal Medicine

## 2011-11-24 ENCOUNTER — Ambulatory Visit (INDEPENDENT_AMBULATORY_CARE_PROVIDER_SITE_OTHER): Payer: Medicare Other | Admitting: *Deleted

## 2011-11-24 DIAGNOSIS — I4891 Unspecified atrial fibrillation: Secondary | ICD-10-CM

## 2011-11-24 DIAGNOSIS — Z7901 Long term (current) use of anticoagulants: Secondary | ICD-10-CM

## 2011-11-24 LAB — POCT INR: INR: 3.9

## 2011-12-16 ENCOUNTER — Ambulatory Visit (INDEPENDENT_AMBULATORY_CARE_PROVIDER_SITE_OTHER): Payer: Medicare Other | Admitting: *Deleted

## 2011-12-16 DIAGNOSIS — I4891 Unspecified atrial fibrillation: Secondary | ICD-10-CM

## 2011-12-16 DIAGNOSIS — Z7901 Long term (current) use of anticoagulants: Secondary | ICD-10-CM

## 2011-12-16 LAB — POCT INR: INR: 2.9

## 2012-01-03 ENCOUNTER — Encounter (HOSPITAL_COMMUNITY): Payer: Self-pay

## 2012-01-03 ENCOUNTER — Emergency Department (HOSPITAL_COMMUNITY): Payer: Medicare Other

## 2012-01-03 ENCOUNTER — Inpatient Hospital Stay (HOSPITAL_COMMUNITY)
Admission: EM | Admit: 2012-01-03 | Discharge: 2012-01-06 | DRG: 812 | Disposition: A | Discharge status: Home / Self Care | Payer: Medicare Other | Attending: Cardiology | Admitting: Cardiology

## 2012-01-03 ENCOUNTER — Other Ambulatory Visit: Payer: Self-pay

## 2012-01-03 DIAGNOSIS — D649 Anemia, unspecified: Secondary | ICD-10-CM

## 2012-01-03 DIAGNOSIS — I739 Peripheral vascular disease, unspecified: Secondary | ICD-10-CM | POA: Diagnosis present

## 2012-01-03 DIAGNOSIS — X58XXXA Exposure to other specified factors, initial encounter: Secondary | ICD-10-CM | POA: Diagnosis not present

## 2012-01-03 DIAGNOSIS — J449 Chronic obstructive pulmonary disease, unspecified: Secondary | ICD-10-CM | POA: Diagnosis present

## 2012-01-03 DIAGNOSIS — E785 Hyperlipidemia, unspecified: Secondary | ICD-10-CM | POA: Diagnosis present

## 2012-01-03 DIAGNOSIS — S3720XA Unspecified injury of bladder, initial encounter: Secondary | ICD-10-CM | POA: Diagnosis not present

## 2012-01-03 DIAGNOSIS — Z79899 Other long term (current) drug therapy: Secondary | ICD-10-CM

## 2012-01-03 DIAGNOSIS — R079 Chest pain, unspecified: Secondary | ICD-10-CM

## 2012-01-03 DIAGNOSIS — N400 Enlarged prostate without lower urinary tract symptoms: Secondary | ICD-10-CM | POA: Diagnosis present

## 2012-01-03 DIAGNOSIS — K922 Gastrointestinal hemorrhage, unspecified: Secondary | ICD-10-CM | POA: Diagnosis present

## 2012-01-03 DIAGNOSIS — I208 Other forms of angina pectoris: Secondary | ICD-10-CM

## 2012-01-03 DIAGNOSIS — D126 Benign neoplasm of colon, unspecified: Secondary | ICD-10-CM | POA: Diagnosis present

## 2012-01-03 DIAGNOSIS — M1990 Unspecified osteoarthritis, unspecified site: Secondary | ICD-10-CM | POA: Diagnosis present

## 2012-01-03 DIAGNOSIS — E119 Type 2 diabetes mellitus without complications: Secondary | ICD-10-CM | POA: Diagnosis present

## 2012-01-03 DIAGNOSIS — I10 Essential (primary) hypertension: Secondary | ICD-10-CM | POA: Diagnosis present

## 2012-01-03 DIAGNOSIS — Z888 Allergy status to other drugs, medicaments and biological substances status: Secondary | ICD-10-CM

## 2012-01-03 DIAGNOSIS — Z9849 Cataract extraction status, unspecified eye: Secondary | ICD-10-CM

## 2012-01-03 DIAGNOSIS — K5520 Angiodysplasia of colon without hemorrhage: Secondary | ICD-10-CM

## 2012-01-03 DIAGNOSIS — J4489 Other specified chronic obstructive pulmonary disease: Secondary | ICD-10-CM | POA: Diagnosis present

## 2012-01-03 DIAGNOSIS — M129 Arthropathy, unspecified: Secondary | ICD-10-CM | POA: Diagnosis present

## 2012-01-03 DIAGNOSIS — R310 Gross hematuria: Secondary | ICD-10-CM | POA: Diagnosis not present

## 2012-01-03 DIAGNOSIS — I2089 Other forms of angina pectoris: Secondary | ICD-10-CM

## 2012-01-03 DIAGNOSIS — Z87891 Personal history of nicotine dependence: Secondary | ICD-10-CM

## 2012-01-03 DIAGNOSIS — I2489 Other forms of acute ischemic heart disease: Secondary | ICD-10-CM | POA: Diagnosis present

## 2012-01-03 DIAGNOSIS — Z7982 Long term (current) use of aspirin: Secondary | ICD-10-CM

## 2012-01-03 DIAGNOSIS — I2510 Atherosclerotic heart disease of native coronary artery without angina pectoris: Secondary | ICD-10-CM | POA: Diagnosis present

## 2012-01-03 DIAGNOSIS — I4891 Unspecified atrial fibrillation: Secondary | ICD-10-CM | POA: Diagnosis present

## 2012-01-03 DIAGNOSIS — R1310 Dysphagia, unspecified: Secondary | ICD-10-CM | POA: Diagnosis present

## 2012-01-03 DIAGNOSIS — I248 Other forms of acute ischemic heart disease: Secondary | ICD-10-CM | POA: Diagnosis present

## 2012-01-03 DIAGNOSIS — Z7901 Long term (current) use of anticoagulants: Secondary | ICD-10-CM | POA: Diagnosis present

## 2012-01-03 DIAGNOSIS — S3730XA Unspecified injury of urethra, initial encounter: Secondary | ICD-10-CM | POA: Diagnosis not present

## 2012-01-03 DIAGNOSIS — Z951 Presence of aortocoronary bypass graft: Secondary | ICD-10-CM

## 2012-01-03 DIAGNOSIS — D62 Acute posthemorrhagic anemia: Principal | ICD-10-CM | POA: Diagnosis present

## 2012-01-03 DIAGNOSIS — Z6828 Body mass index (BMI) 28.0-28.9, adult: Secondary | ICD-10-CM

## 2012-01-03 DIAGNOSIS — I252 Old myocardial infarction: Secondary | ICD-10-CM

## 2012-01-03 HISTORY — DX: Type 2 diabetes mellitus without complications: E11.9

## 2012-01-03 HISTORY — DX: Melena: K92.1

## 2012-01-03 HISTORY — DX: Occlusion and stenosis of unspecified carotid artery: I65.29

## 2012-01-03 HISTORY — DX: Personal history of nicotine dependence: Z87.891

## 2012-01-03 HISTORY — DX: Anemia, unspecified: D64.9

## 2012-01-03 LAB — HEPATIC FUNCTION PANEL
Alkaline Phosphatase: 64 U/L (ref 39–117)
Total Protein: 7.3 g/dL (ref 6.0–8.3)

## 2012-01-03 LAB — PROTIME-INR
INR: 3.07 — ABNORMAL HIGH (ref 0.00–1.49)
Prothrombin Time: 32.2 seconds — ABNORMAL HIGH (ref 11.6–15.2)
Prothrombin Time: 31 seconds — ABNORMAL HIGH (ref 11.6–15.2)
Prothrombin Time: 33.1 seconds — ABNORMAL HIGH (ref 11.6–15.2)

## 2012-01-03 LAB — GLUCOSE, CAPILLARY

## 2012-01-03 LAB — CBC
HCT: 32 % — ABNORMAL LOW (ref 39.0–52.0)
Hemoglobin: 7.7 g/dL — ABNORMAL LOW (ref 13.0–17.0)
Hemoglobin: 10.1 g/dL — ABNORMAL LOW (ref 13.0–17.0)
MCH: 25.8 pg — ABNORMAL LOW (ref 26.0–34.0)
MCHC: 30.6 g/dL (ref 30.0–36.0)
MCHC: 31.6 g/dL (ref 30.0–36.0)
MCV: 81.8 fL (ref 78.0–100.0)
Platelets: 270 10*3/uL (ref 150–400)
RBC: 2.97 MIL/uL — ABNORMAL LOW (ref 4.22–5.81)
RBC: 3.91 MIL/uL — ABNORMAL LOW (ref 4.22–5.81)

## 2012-01-03 LAB — DIFFERENTIAL
Basophils Relative: 6 % — ABNORMAL HIGH (ref 0–1)
Basophils Relative: 5 % — ABNORMAL HIGH (ref 0–1)
Eosinophils Absolute: 0.7 10*3/uL (ref 0.0–0.7)
Eosinophils Relative: 8 % — ABNORMAL HIGH (ref 0–5)
Lymphs Abs: 2 10*3/uL (ref 0.7–4.0)
Monocytes Absolute: 0.5 10*3/uL (ref 0.1–1.0)
Monocytes Relative: 6 % (ref 3–12)
Neutro Abs: 3.7 10*3/uL (ref 1.7–7.7)
Neutro Abs: 4.9 10*3/uL (ref 1.7–7.7)
Neutrophils Relative %: 57 % (ref 43–77)
Neutrophils Relative %: 58 % (ref 43–77)

## 2012-01-03 LAB — PRO B NATRIURETIC PEPTIDE: Pro B Natriuretic peptide (BNP): 2311 pg/mL — ABNORMAL HIGH (ref 0–450)

## 2012-01-03 LAB — BASIC METABOLIC PANEL
BUN: 19 mg/dL (ref 6–23)
CO2: 24 mEq/L (ref 19–32)
Calcium: 9.4 mg/dL (ref 8.4–10.5)
Chloride: 104 mEq/L (ref 96–112)
Chloride: 104 mEq/L (ref 96–112)
Creatinine, Ser: 1.26 mg/dL (ref 0.50–1.35)
GFR calc Af Amer: 60 mL/min — ABNORMAL LOW (ref >90)
Glucose, Bld: 146 mg/dL — ABNORMAL HIGH (ref 70–99)
Potassium: 3.8 mEq/L (ref 3.5–5.1)
Sodium: 141 mEq/L (ref 135–145)

## 2012-01-03 LAB — CARDIAC PANEL(CRET KIN+CKTOT+MB+TROPI)
Relative Index: INVALID (ref 0.0–2.5)
Total CK: 68 U/L (ref 7–232)
Troponin I: <0.3 ng/mL (ref <0.30)

## 2012-01-03 LAB — POCT I-STAT TROPONIN I: Troponin i, poc: 0.13 ng/mL (ref 0.00–0.08)

## 2012-01-03 LAB — PREPARE RBC (CROSSMATCH)

## 2012-01-03 MED ORDER — SODIUM CHLORIDE 0.9 % IJ SOLN
3.0000 mL | Freq: Two times a day (BID) | INTRAMUSCULAR | Status: DC
Start: 2012-01-03 — End: 2012-01-06
  Administered 2012-01-03 – 2012-01-05 (×5): 3 mL via INTRAVENOUS

## 2012-01-03 MED ORDER — SODIUM CHLORIDE 0.9 % IJ SOLN: 3.0000 mL | INTRAMUSCULAR | Status: DC | PRN

## 2012-01-03 MED ORDER — METOPROLOL TARTRATE 50 MG PO TABS
25.0000 mg | ORAL_TABLET | Freq: Once | ORAL | Status: AC
Start: 2012-01-03 — End: 2012-01-03
  Administered 2012-01-03: 25 mg via ORAL
  Filled 2012-01-03: qty 1

## 2012-01-03 MED ORDER — ONDANSETRON HCL 4 MG/2ML IJ SOLN: 4.0000 mg | Freq: Four times a day (QID) | INTRAMUSCULAR | Status: DC | PRN

## 2012-01-03 MED ORDER — INSULIN ASPART 100 UNIT/ML ~~LOC~~ SOLN
0.0000 [IU] | Freq: Three times a day (TID) | SUBCUTANEOUS | Status: DC
  Administered 2012-01-04: 2 [IU] via SUBCUTANEOUS
  Administered 2012-01-04: 3 [IU] via SUBCUTANEOUS
  Administered 2012-01-04: 2 [IU] via SUBCUTANEOUS
  Administered 2012-01-05: 1 [IU] via SUBCUTANEOUS
  Administered 2012-01-05 – 2012-01-06 (×3): 2 [IU] via SUBCUTANEOUS
  Administered 2012-01-06: 1 [IU] via SUBCUTANEOUS

## 2012-01-03 MED ORDER — NITROGLYCERIN 0.4 MG SL SUBL: 0.4000 mg | SUBLINGUAL_TABLET | SUBLINGUAL | Status: DC | PRN

## 2012-01-03 MED ORDER — AMIODARONE HCL 200 MG PO TABS
100.0000 mg | ORAL_TABLET | Freq: Every day | ORAL | Status: DC
  Administered 2012-01-03 – 2012-01-06 (×4): 100 mg via ORAL
  Filled 2012-01-03 (×4): qty 0.5

## 2012-01-03 MED ORDER — FUROSEMIDE 10 MG/ML IJ SOLN
40.0000 mg | Freq: Once | INTRAMUSCULAR | Status: AC
  Administered 2012-01-03: 40 mg via INTRAVENOUS
  Filled 2012-01-03: qty 4

## 2012-01-03 MED ORDER — CHLORHEXIDINE GLUCONATE CLOTH 2 % EX PADS
6.0000 | MEDICATED_PAD | Freq: Every day | CUTANEOUS | Status: DC
  Administered 2012-01-04 – 2012-01-06 (×3): 6 via TOPICAL

## 2012-01-03 MED ORDER — PANTOPRAZOLE SODIUM 40 MG PO TBEC
40.0000 mg | DELAYED_RELEASE_TABLET | Freq: Every day | ORAL | Status: DC
  Administered 2012-01-03 – 2012-01-06 (×4): 40 mg via ORAL
  Filled 2012-01-03 (×4): qty 1

## 2012-01-03 MED ORDER — DEXTROSE 5 % IV SOLN
0.5000 mg | Freq: Once | INTRAVENOUS | Status: AC
  Administered 2012-01-03: 0.5 mg via INTRAVENOUS
  Filled 2012-01-03: qty 0.05

## 2012-01-03 MED ORDER — NITROGLYCERIN 2 % TD OINT
1.0000 [in_us] | TOPICAL_OINTMENT | Freq: Once | TRANSDERMAL | Status: AC
  Administered 2012-01-03: 1 [in_us] via TOPICAL
  Filled 2012-01-03: qty 1

## 2012-01-03 MED ORDER — ACETAMINOPHEN 325 MG PO TABS: 650.0000 mg | ORAL_TABLET | ORAL | Status: DC | PRN

## 2012-01-03 MED ORDER — ASPIRIN 81 MG PO CHEW
324.0000 mg | CHEWABLE_TABLET | ORAL | Status: AC
  Administered 2012-01-03: 324 mg via ORAL
  Filled 2012-01-03: qty 4

## 2012-01-03 MED ORDER — METOPROLOL TARTRATE 25 MG PO TABS
25.0000 mg | ORAL_TABLET | Freq: Two times a day (BID) | ORAL | Status: DC
  Administered 2012-01-03 – 2012-01-06 (×6): 25 mg via ORAL
  Filled 2012-01-03 (×7): qty 1

## 2012-01-03 MED ORDER — ASPIRIN 300 MG RE SUPP
300.0000 mg | RECTAL | Status: AC
  Filled 2012-01-03: qty 1

## 2012-01-03 MED ORDER — SODIUM CHLORIDE 0.9 % IV SOLN: 250.0000 mL | INTRAVENOUS | Status: DC | PRN

## 2012-01-03 MED ORDER — ALBUTEROL SULFATE (5 MG/ML) 0.5% IN NEBU
2.5000 mg | INHALATION_SOLUTION | Freq: Four times a day (QID) | RESPIRATORY_TRACT | Status: DC | PRN
  Administered 2012-01-05 – 2012-01-06 (×2): 2.5 mg via RESPIRATORY_TRACT
  Filled 2012-01-03 (×2): qty 0.5

## 2012-01-03 MED ORDER — INSULIN ASPART 100 UNIT/ML ~~LOC~~ SOLN
0.0000 [IU] | Freq: Every day | SUBCUTANEOUS | Status: DC
  Administered 2012-01-03: 2 [IU] via SUBCUTANEOUS

## 2012-01-03 MED ORDER — MUPIROCIN 2 % EX OINT
1.0000 "application " | TOPICAL_OINTMENT | Freq: Two times a day (BID) | CUTANEOUS | Status: DC
  Administered 2012-01-03 – 2012-01-06 (×6): 1 via NASAL
  Filled 2012-01-03: qty 22

## 2012-01-03 MED ORDER — AMIODARONE HCL 200 MG PO TABS
200.0000 mg | ORAL_TABLET | Freq: Every day | ORAL | Status: DC
  Filled 2012-01-03 (×2): qty 1

## 2012-01-03 MED ORDER — ASPIRIN 81 MG PO CHEW
81.0000 mg | CHEWABLE_TABLET | Freq: Once | ORAL | Status: AC
  Administered 2012-01-03: 81 mg via ORAL
  Filled 2012-01-03: qty 4

## 2012-01-03 NOTE — ED Notes (Signed)
Dr. Estell Harpin in to discuss results with pt.

## 2012-01-03 NOTE — ED Notes (Signed)
[facility] called and on way to transport pt.

## 2012-01-03 NOTE — ED Notes (Signed)
Wife states they ate late last night. States pt woke up around 0100 this morning with chest pain. States he did not take his morning meds

## 2012-01-03 NOTE — ED Notes (Signed)
Beeped Dr. Juanetta Gosling to [phone].

## 2012-01-03 NOTE — H&P (Signed)
Patient ID: [name] MRN: [id number], DOB/AGE: Sep 05, 1932   Admit date: 01/03/2012 Date of Consult: @TODAY @  Primary Physician: [name], MD, MD Primary Cardiologist: Lovina Reach   Pt. Profile: 78 year old who is admitted for CP.  Problem List: Past Medical History  Diagnosis Date  . Hypertension   . COPD (chronic obstructive pulmonary disease)   . Myocardial infarction   . Arthritis   . Atrial fibrillation   . Dysphagia   . Blood transfusion     Past Surgical History  Procedure Date  . Knee surgery 1960    Right knee cartilage  . Rotator cuff repair 1990    right   . Coronary artery bypass graft 1996    4 by-pass  . Inguinal hernia repair 2000    left  . Cholecystectomy 07/2010  . Back surgery 02/2006    ruptured disk  . Tonsillectomy   . Esophagogastroduodenoscopy 10/08/2011    Procedure: ESOPHAGOGASTRODUODENOSCOPY (EGD);  Surgeon: Malissa Hippo, MD;  Location: [facility];  Service: Endoscopy;  Laterality: N/A;  . Colonoscopy 10/08/2011    Procedure: COLONOSCOPY;  Surgeon: Malissa Hippo, MD;  Location: [facility];  Service: Endoscopy;  Laterality: N/A;     Allergies:  Allergies  Allergen Reactions  . Statins Other (See Comments)    Causes legs to hurt.     HPI:  Patient is a 78 year old with a hsitory of CAD (s/p CABG in 1996.  Myoview in 2010 negative for ischemia).  Also a history of atrial fibrillation, dyslipidemia, CV disease and DJD.   I saw him in clinic in January 2012.   Patient says for the past 6 months that he has felt like he was getting weaker.  OVer the past 3 wks he has more SOB with activity and chest tightness.  INtermittent Last night woke up at 1 AM like "elephant sitting on chest"  Went to [facility] ER in AM.  Found to be anemic.  Transfused 1 u PRBC at [facility].  Transfer to [facility].  Since arrival he has had some SOB and chst tightness.  Worse a little bit ago, now eased off.  Note he had and EGD and colonoscopy by Dr.  Karilyn Cota that showed gastritis, erosions, scars.  Colon with diverticula, polyps.  Single small AVM in transverse colon. Last  Monthly INRs have been 3.3, 3.9 and 2.9  Inpatient Medications:    . amiodarone  100 mg Oral Daily  . aspirin  81 mg Oral Once  . metoprolol  25 mg Oral Once  . nitroGLYCERIN  1 inch Topical Once  . DISCONTD: amiodarone  200 mg Oral Daily    Family History  Problem Relation Age of Onset  . Heart disease Sister   . Heart disease Brother   . Coronary artery disease Mother   . Cancer Father     stomach  . Anesthesia problems Neg Hx   . Hypotension Neg Hx   . Malignant hyperthermia Neg Hx   . Pseudochol deficiency Neg Hx      History   Social History  . Marital Status: Married    Spouse Name: N/A    Number of Children: N/A  . Years of Education: N/A   Occupational History  . Not on file.   Social History Main Topics  . Smoking status: Former Smoker -- 2.0 packs/day for 40 years    Types: Cigarettes    Quit date: 09/27/1990  . Smokeless tobacco:  Not on file  . Alcohol Use: No  . Drug Use: No  . Sexually Active: Not Currently   Other Topics Concern  . Not on file   Social History Narrative  . No narrative on file     Review of Systems: General: negative for chills, fever, night sweats or weight changes.  Cardiovascular: negative for chest pain, dyspnea on exertion, edema, orthopnea, palpitations, paroxysmal nocturnal dyspnea or shortness of breath Dermatological: negative for rash Respiratory: negative for cough or wheezing Urologic: negative for hematuria Abdominal: negative for nausea, vomiting, diarrhea, bright red blood per rectum, melena, or hematemesis Neurologic: negative for visual changes, syncope, or dizziness All other systems reviewed and are otherwise negative except as noted above.  Physical Exam: Filed Vitals:   01/03/12 1800  BP:   Pulse:   Temp: 97.5 F (36.4 C)  Resp:     Intake/Output Summary (Last 24 hours)  at 01/03/12 1832 Last data filed at 01/03/12 1635  Gross per 24 hour  Intake    750 ml  Output      0 ml  Net    750 ml    General: Well developed, well nourished, in very mild distress. Head: Normocephalic, atraumatic, sclera non-icteric Neck: Negative for carotid bruits. JVP is elevated. Lungs: upper airway wheeze.  Otherwise clear to auscultation. Heart: RRR with S1 S2. No murmurs, rubs, or gallops appreciated. Abdomen: Soft, non-tender, non-distended with normoactive bowel sounds. No hepatomegaly. No rebound/guarding. No obvious abdominal masses. Msk:  Strength and tone appears normal for age. Extremities: No clubbing, cyanosis  1+ edema.   Distal pedal pulses are 2+ and equal bilaterally. Neuro: Alert and oriented X 3. Moves all extremities spontaneously. Psych:  Responds to questions appropriately with a normal affect.  Labs: Results for orders placed during the hospital encounter of 01/03/12 (from the past 24 hour(s))  CBC     Status: Abnormal   Collection Time   01/03/12  9:37 AM      Component Value Range   WBC 6.4  4.0 - 10.5 (K/uL)   RBC 2.97 (*) 4.22 - 5.81 (MIL/uL)   Hemoglobin 7.7 (*) 13.0 - 17.0 (g/dL)   HCT 14.7 (*) 82.9 - 52.0 (%)   MCV 84.8  78.0 - 100.0 (fL)   MCH 25.9 (*) 26.0 - 34.0 (pg)   MCHC 30.6  30.0 - 36.0 (g/dL)   RDW 56.2 (*) 13.0 - 15.5 (%)   Platelets 270  150 - 400 (K/uL)  DIFFERENTIAL     Status: Abnormal   Collection Time   01/03/12  9:37 AM      Component Value Range   Neutrophils Relative 57  43 - 77 (%)   Neutro Abs 3.7  1.7 - 7.7 (K/uL)   Lymphocytes Relative 23  12 - 46 (%)   Lymphs Abs 1.5  0.7 - 4.0 (K/uL)   Monocytes Relative 6  3 - 12 (%)   Monocytes Absolute 0.4  0.1 - 1.0 (K/uL)   Eosinophils Relative 8 (*) 0 - 5 (%)   Eosinophils Absolute 0.5  0.0 - 0.7 (K/uL)   Basophils Relative 6 (*) 0 - 1 (%)   Basophils Absolute 0.4 (*) 0.0 - 0.1 (K/uL)  BASIC METABOLIC PANEL     Status: Abnormal   Collection Time   01/03/12  9:37 AM       Component Value Range   Sodium 141  135 - 145 (mEq/L)   Potassium 3.8  3.5 - 5.1 (mEq/L)  Chloride 104  96 - 112 (mEq/L)   CO2 24  19 - 32 (mEq/L)   Glucose, Bld 254 (*) 70 - 99 (mg/dL)   BUN 19  6 - 23 (mg/dL)   Creatinine, Ser 4.40  0.50 - 1.35 (mg/dL)   Calcium 9.5  8.4 - 10.2 (mg/dL)   GFR calc non Af Amer 52 (*) >90 (mL/min)   GFR calc Af Amer 60 (*) >90 (mL/min)  PROTIME-INR     Status: Abnormal   Collection Time   01/03/12  9:37 AM      Component Value Range   Prothrombin Time 32.2 (*) 11.6 - 15.2 (seconds)   INR 3.07 (*) 0.00 - 1.49   POCT I-STAT TROPONIN I     Status: Abnormal   Collection Time   01/03/12  9:39 AM      Component Value Range   Troponin i, poc 0.13 (*) 0.00 - 0.08 (ng/mL)   Comment 3       POC Troponin I DR. Zammit made aware.    HEPATIC FUNCTION PANEL     Status: Normal   Collection Time   01/03/12 10:22 AM      Component Value Range   Total Protein 7.3  6.0 - 8.3 (g/dL)   Albumin 3.8  3.5 - 5.2 (g/dL)   AST 25  0 - 37 (U/L)   ALT 13  0 - 53 (U/L)   Alkaline Phosphatase 64  39 - 117 (U/L)   Total Bilirubin 0.4  0.3 - 1.2 (mg/dL)   Bilirubin, Direct <7.2  0.0 - 0.3 (mg/dL)   Indirect Bilirubin NOT CALCULATED  0.3 - 0.9 (mg/dL)  OCCULT BLOOD, POC DEVICE     Status: Normal   Collection Time   01/03/12 10:51 AM      Component Value Range   Fecal Occult Bld NEGATIVE    TYPE AND SCREEN     Status: Normal (Preliminary result)   Collection Time   01/03/12 11:05 AM      Component Value Range   ABO/RH(D) A NEG     Antibody Screen NEG     Sample Expiration 01/06/2012     Unit Number [id number]     Blood Component Type RED CELLS,LR     Unit division 00     Status of Unit ISSUED     Transfusion Status OK TO TRANSFUSE     Crossmatch Result Compatible     Unit Number [id number]     Blood Component Type RED CELLS,LR     Unit division 00     Status of Unit ISSUED     Transfusion Status OK TO TRANSFUSE     Crossmatch Result Compatible    PREPARE RBC  (CROSSMATCH)     Status: Normal   Collection Time   01/03/12 11:05 AM      Component Value Range   Order Confirmation ORDER PROCESSED BY BLOOD BANK    POCT I-STAT TROPONIN I     Status: Abnormal   Collection Time   01/03/12  2:11 PM      Component Value Range   Troponin i, poc 0.12 (*) 0.00 - 0.08 (ng/mL)   Comment NOTIFIED PHYSICIAN     Comment 3           MRSA PCR SCREENING     Status: Abnormal   Collection Time   01/03/12  4:36 PM      Component Value Range   MRSA by PCR POSITIVE (*) NEGATIVE  Radiology/Studies: Dg Chest Portable 1 View  01/03/2012  *RADIOLOGY REPORT*  Clinical Data: Intermittent chest pain for 2 weeks.  History of open heart surgery.  PORTABLE CHEST - 1 VIEW  Comparison: 10/06/2011 and 09/29/2011.  Findings: 0944 hours.  The heart size and mediastinal contours are stable status post CABG.  There is stable atelectasis or scarring in both lung bases.  No confluent airspace opacity, edema or significant pleural effusion is identified.  IMPRESSION: Stable examination.  No acute cardiopulmonary process.  Original Report Authenticated By: Gerrianne Scale, M.D.    EKG:  NSR.  70 bpm.  ST depression in V4 to V6, II, III consistent with ischemia.  ASSESSMENT AND PLAN:  Active Problems:   DYSLIPIDEMIA  Not on agent.  Statins have caused myalgias.  Check lipids in AM.  CAD  Patient with remote CABG   Now with increasing symptoms that may be related more to anemia in seting of CAD.  Follow troponin, CK.  Unfortunately, not a candidate now for any intervention until Hgb stabilizes and GI status clarified.  Aspirin for now.    Atrial fibrillation  Will follow on telemetry.  In SR.  Continue amiodarone.  D/C coumadin and reverse with Vit K.    Long term current use of anticoagulant  With GI history now does not appear to be good candidate for anticoagulation  PVOD:  History of CV disease.  Followed By C. Edilia Bo.   GI bleeding  Hemoccult negative.  Will ask GI to see in  AM.  WIll probably need EGD to evaluate.   Continue to follow H/H closely.  Hemoccult stool.  Protonix bid.   Acute blood loss anemia  Patient presents with severe anemia.  Has received 1 U PRBC.  Will repeat H/H  And transfuse as needed.     Scherrie Merritts 01/03/2012, 6:32 PM

## 2012-01-03 NOTE — ED Provider Notes (Addendum)
History    This chart was scribed for Caleb Lennert, MD, MD by Smitty Pluck. The patient was seen in room APA18 and the patient's care was started at 9:53AM.   CSN: [id number]  Arrival date & time 01/03/12  7829   First MD Initiated Contact with Patient 01/03/12 [phone]      Chief Complaint  Patient presents with  . Chest Pain    (Consider location/radiation/quality/duration/timing/severity/associated sxs/prior treatment) Patient is a 78 y.o. male presenting with chest pain. The history is provided by the patient.  Chest Pain The chest pain began 1 - 2 weeks ago. Chest pain occurs constantly. The chest pain is unchanged. The pain is associated with exertion. The severity of the pain is moderate. The pain does not radiate.    [name] is a 78 y.o. male who presents to the Emergency Department complaining of moderate chest pain onset 2 weeks ago. Pt reports that the pain is aggravated by exertion. Pt reports that he has SOB during exertion. He states that he is starting to having pain while he is resting. The pain has been constant since onset without radiation. He has not taken any medication this morning. He takes amiodarone, coumadin, norvasc and aspirin. Pt has hx of 4 bypasses, 2 catherizations. Pt took two TUMS without relief.  PCP is Dr. Juanetta [name] Cardiologist is Dr. Tenny [name]  Past Medical History  Diagnosis Date  . Hypertension   . COPD (chronic obstructive pulmonary disease)   . Myocardial infarction   . Arthritis   . Atrial fibrillation   . Dysphagia   . Blood transfusion     Past Surgical History  Procedure Date  . Knee surgery 1960    Right knee cartilage  . Rotator cuff repair 1990    right   . Coronary artery bypass graft 1996    4 by-pass  . Inguinal hernia repair 2000    left  . Cholecystectomy 07/2010  . Back surgery 02/2006    ruptured disk  . Tonsillectomy   . Esophagogastroduodenoscopy 10/08/2011    Procedure: ESOPHAGOGASTRODUODENOSCOPY (EGD);   Surgeon: Malissa Hippo, MD;  Location: [facility];  Service: Endoscopy;  Laterality: N/A;  . Colonoscopy 10/08/2011    Procedure: COLONOSCOPY;  Surgeon: Malissa Hippo, MD;  Location: [facility];  Service: Endoscopy;  Laterality: N/A;    Family History  Problem Relation Age of Onset  . Heart disease Sister   . Heart disease Brother   . Coronary artery disease Mother   . Cancer Father     stomach  . Anesthesia problems Neg Hx   . Hypotension Neg Hx   . Malignant hyperthermia Neg Hx   . Pseudochol deficiency Neg Hx     History  Substance Use Topics  . Smoking status: Former Smoker -- 2.0 packs/day for 40 years    Types: Cigarettes    Quit date: 09/27/1990  . Smokeless tobacco: Not on file  . Alcohol Use: No      Review of Systems  Cardiovascular: Positive for chest pain.  All other systems reviewed and are negative.   10 Systems reviewed and all are negative for acute change except as noted in the HPI.   Allergies  Statins  Home Medications   Current Outpatient Rx  Name Route Sig Dispense Refill  . ACETAMINOPHEN 500 MG PO TABS Oral Take 1,000 mg by mouth every 6 (six) hours as needed. Pain    . AMIODARONE HCL 200 MG PO TABS  Oral Take 0.5 tablets (100 mg total) by mouth daily. 30 tablet 5  . AMLODIPINE BESYLATE 5 MG PO TABS  TAKE ONE TABLET BY MOUTH EVERY DAY FOR BLOOD PRESSURE 90 tablet 0  . ASPIRIN 81 MG PO TABS Oral Take 81 mg by mouth daily.      Marland Kitchen DIAZEPAM 5 MG PO TABS Oral Take 5 mg by mouth every 6 (six) hours as needed. Muscles Spasms    . HYDROCODONE-ACETAMINOPHEN 5-325 MG PO TABS Oral Take 1 tablet by mouth every 6 (six) hours as needed. Pain    . METFORMIN HCL 500 MG PO TABS Oral Take 500 mg by mouth 2 (two) times daily with a meal.    . METOPROLOL TARTRATE 25 MG PO TABS Oral Take 25 mg by mouth 2 (two) times daily.      Marland Kitchen ONE-DAILY MULTI VITAMINS PO TABS Oral Take 1 tablet by mouth daily.      Marland Kitchen PREGABALIN 50 MG PO CAPS Oral Take 50 mg by mouth 2  (two) times daily.      . RED YEAST RICE 600 MG PO TABS Oral Take 1 tablet by mouth daily.      Marland Kitchen SILODOSIN 8 MG PO CAPS Oral Take 8 mg by mouth daily with breakfast.        BP 133/54  Temp(Src) 97.9 F (36.6 C) (Oral)  Resp 18  Ht 5' 11.5" (1.816 m)  Wt 230 lb (104.327 kg)  BMI 31.63 kg/m2  SpO2 98%  Physical Exam  Nursing note and vitals reviewed. Constitutional: He is oriented to person, place, and time. He appears well-developed.  HENT:  Head: Normocephalic and atraumatic.  Eyes: Conjunctivae and EOM are normal. No scleral icterus.  Neck: Neck supple. No thyromegaly present.  Cardiovascular: Normal rate and regular rhythm.  Exam reveals no gallop and no friction rub.   Murmur (2/6 systolic ) heard. Pulmonary/Chest: No stridor. He has no wheezes. He has no rales. He exhibits no tenderness.  Abdominal: He exhibits no distension. There is no tenderness. There is no rebound.  Musculoskeletal: Normal range of motion. He exhibits no edema.  Lymphadenopathy:    He has no cervical adenopathy.  Neurological: He is oriented to person, place, and time. Coordination normal.  Skin: No rash noted. No erythema.  Psychiatric: He has a normal mood and affect. His behavior is normal.    ED Course  Procedures (including critical care time) DIAGNOSTIC STUDIES: Oxygen Saturation is 98% on room air, normal by my interpretation.    COORDINATION OF CARE: 10:02AM EDP discusses pt ED treatment course with pt  10:42AM Recheck: EDP discusses pt lab results with pt. Pt is feeling better.  11:00AM EDP ordered medication: lopressor 25 mg, nitroglyn 2% ointment, asa 81 mg, pacerone 100 mg    Labs Reviewed  CBC - Abnormal; Notable for the following:    RBC 2.97 (*)    Hemoglobin 7.7 (*)    HCT 25.2 (*)    MCH 25.9 (*)    RDW 18.3 (*)    All other components within normal limits  DIFFERENTIAL - Abnormal; Notable for the following:    Eosinophils Relative 8 (*)    Basophils Relative 6 (*)     Basophils Absolute 0.4 (*)    All other components within normal limits  BASIC METABOLIC PANEL - Abnormal; Notable for the following:    Glucose, Bld 254 (*)    GFR calc non Af Amer 52 (*)    GFR calc Af  Amer 60 (*)    All other components within normal limits  PROTIME-INR - Abnormal; Notable for the following:    Prothrombin Time 32.2 (*)    INR 3.07 (*)    All other components within normal limits  POCT I-STAT TROPONIN I - Abnormal; Notable for the following:    Troponin i, poc 0.13 (*)    All other components within normal limits  HEPATIC FUNCTION PANEL  TYPE AND SCREEN  PREPARE RBC (CROSSMATCH)  OCCULT BLOOD, POC DEVICE   Dg Chest Portable 1 View  01/03/2012  *RADIOLOGY REPORT*  Clinical Data: Intermittent chest pain for 2 weeks.  History of open heart surgery.  PORTABLE CHEST - 1 VIEW  Comparison: 10/06/2011 and 09/29/2011.  Findings: 0944 hours.  The heart size and mediastinal contours are stable status post CABG.  There is stable atelectasis or scarring in both lung bases.  No confluent airspace opacity, edema or significant pleural effusion is identified.  IMPRESSION: Stable examination.  No acute cardiopulmonary process.  Original Report Authenticated By: Gerrianne Scale, M.D.     No diagnosis found. CRITICAL CARE Performed by: Jacob Chamblee L   Total critical care time:  45 Critical care time was exclusive of separately billable procedures and treating other patients.  Critical care was necessary to treat or prevent imminent or life-threatening deterioration.  Critical care was time spent personally by me on the following activities: development of treatment plan with patient and/or surrogate as well as nursing, discussions with consultants, evaluation of patient's response to treatment, examination of patient, obtaining history from patient or surrogate, ordering and performing treatments and interventions, ordering and review of laboratory studies, ordering and review  of radiographic studies, pulse oximetry and re-evaluation of patient's condition.    Date: 01/03/2012  Rate:79  Rhythm: normal sinus rhythm  QRS Axis: normal  Intervals: normal  ST/T Wave abnormalities: nonspecific ST changes  Worse in lateral leads  Conduction Disutrbances:none  Narrative Interpretation:   Old EKG Reviewed: changes noted   MDM  angina The chart was scribed for me under my direct supervision.  I personally performed the history, physical, and medical decision making and all procedures in the evaluation of this patient.Caleb Lennert, MD 01/03/12 1243          There were no step down beds at [facility] so dr. Juanetta [name] will admit here to step down and possible transfer the pt later  Caleb Lennert, MD 01/03/12 1445                            A step down bed is now available so the pt is going to [facility]  Caleb Lennert, MD 01/03/12 1447

## 2012-01-03 NOTE — ED Notes (Signed)
Pt 1st unit of blood infusing without difficulty. Pt tolerating with no issues.

## 2012-01-03 NOTE — ED Notes (Signed)
Pt left via [facility] 

## 2012-01-04 ENCOUNTER — Encounter (HOSPITAL_COMMUNITY): Payer: Self-pay | Admitting: Physician Assistant

## 2012-01-04 DIAGNOSIS — D649 Anemia, unspecified: Secondary | ICD-10-CM

## 2012-01-04 DIAGNOSIS — I4891 Unspecified atrial fibrillation: Secondary | ICD-10-CM

## 2012-01-04 DIAGNOSIS — D62 Acute posthemorrhagic anemia: Principal | ICD-10-CM

## 2012-01-04 DIAGNOSIS — Q2733 Arteriovenous malformation of digestive system vessel: Secondary | ICD-10-CM

## 2012-01-04 DIAGNOSIS — Z7901 Long term (current) use of anticoagulants: Secondary | ICD-10-CM

## 2012-01-04 LAB — CBC
Hemoglobin: 9.1 g/dL — ABNORMAL LOW (ref 13.0–17.0)
MCHC: 31.7 g/dL (ref 30.0–36.0)
RBC: 3.5 MIL/uL — ABNORMAL LOW (ref 4.22–5.81)
WBC: 6.7 10*3/uL (ref 4.0–10.5)

## 2012-01-04 LAB — TYPE AND SCREEN
ABO/RH(D): A NEG
Unit division: 0

## 2012-01-04 LAB — LIPID PANEL
Cholesterol: 134 mg/dL (ref 0–200)
Total CHOL/HDL Ratio: 3.9 RATIO
Triglycerides: 174 mg/dL — ABNORMAL HIGH (ref <150)

## 2012-01-04 LAB — BASIC METABOLIC PANEL
Chloride: 103 mEq/L (ref 96–112)
Creatinine, Ser: 1.28 mg/dL (ref 0.50–1.35)
GFR calc Af Amer: 61 mL/min — ABNORMAL LOW (ref >90)
Sodium: 140 mEq/L (ref 135–145)

## 2012-01-04 LAB — HEMOGLOBIN A1C
Hgb A1c MFr Bld: 6.7 % — ABNORMAL HIGH (ref <5.7)
Mean Plasma Glucose: 146 mg/dL — ABNORMAL HIGH (ref <117)

## 2012-01-04 LAB — CARDIAC PANEL(CRET KIN+CKTOT+MB+TROPI): Troponin I: 0.3 ng/mL (ref <0.30)

## 2012-01-04 LAB — PROTIME-INR
INR: 2.46 — ABNORMAL HIGH (ref 0.00–1.49)
Prothrombin Time: 27.1 seconds — ABNORMAL HIGH (ref 11.6–15.2)

## 2012-01-04 LAB — GLUCOSE, CAPILLARY: Glucose-Capillary: 196 mg/dL — ABNORMAL HIGH (ref 70–99)

## 2012-01-04 LAB — PRO B NATRIURETIC PEPTIDE: Pro B Natriuretic peptide (BNP): 2550 pg/mL — ABNORMAL HIGH (ref 0–450)

## 2012-01-04 NOTE — Consult Note (Signed)
I have taken a history, examined the patient and reviewed the chart. Anemia with heme negative stool. He may have intermittent GI blood loss from AVMs, erosive esophagitis, erosive gastritis or a hematologic problem. Start a daily PPI for long term use. Recommend that he returns to Dr. Karilyn Cota for a capsule endoscopy study and we have contacted his office for a follow up appt. Dr. Karilyn Cota to consider colonoscopy with polypectomy electively. I agree with the extender's note, impression and recommendations.  Meryl Dare MD [facility]

## 2012-01-04 NOTE — Progress Notes (Signed)
Patient ID: JUANA MONTINI, male   DOB: 03-Mar-1932, 78 y.o.   MRN: [id number]    @ Subjective:  Denies SSCP, palpitations or Dyspnea   Objective:  Filed Vitals:   01/03/12 2100 01/04/12 0055 01/04/12 0454 01/04/12 0700  BP: 151/57 136/86 130/57 126/53  Pulse: 77 69 69 68  Temp: 97 F (36.1 C) 98.1 F (36.7 C) 98.3 F (36.8 C) 98.2 F (36.8 C)  TempSrc: Oral Axillary Axillary Oral  Resp: 16 17 20 20   Height:      Weight:  215 lb 9.8 oz (97.8 kg)    SpO2: 98% 98% 98% 97%    Intake/Output from previous day:  Intake/Output Summary (Last 24 hours) at 01/04/12 1003 Last data filed at 01/04/12 0700  Gross per 24 hour  Intake    990 ml  Output   3300 ml  Net  -2310 ml    Physical Exam: Pale Lungs clear SEM BS positive Plus 2 PT Trace edema Neuro nonfocal  Lab Results: Basic Metabolic Panel:  Basename 01/04/12 0645 01/03/12 1925  NA 140 141  K 3.6 3.9  CL 103 104  CO2 28 24  GLUCOSE 159* 146*  BUN 17 17  CREATININE 1.28 1.26  CALCIUM 9.1 9.4  MG -- --  PHOS -- --   Liver Function Tests:  Basename 01/03/12 1022  AST 25  ALT 13  ALKPHOS 64  BILITOT 0.4  PROT 7.3  ALBUMIN 3.8   No results found for this basename: LIPASE:2,AMYLASE:2 in the last 72 hours CBC:  Basename 01/04/12 0645 01/03/12 1925 01/03/12 0937  WBC 6.7 8.5 --  NEUTROABS -- 4.9 3.7  HGB 9.1* 10.1* --  HCT 28.7* 32.0* --  MCV 82.0 81.8 --  PLT 236 281 --   Cardiac Enzymes:  Basename 01/04/12 0645 01/04/12 0056 01/03/12 1925  CKTOTAL 53 54 68  CKMB 1.9 2.1 2.4  CKMBINDEX -- -- --  TROPONINI 0.30* 0.34* <0.30   BNP: No components found with this basename: POCBNP:3 D-Dimer: No results found for this basename: DDIMER:2 in the last 72 hours Hemoglobin A1C:  Basename 01/03/12 2122  HGBA1C 6.7*   Fasting Lipid Panel:  Basename 01/04/12 0645  CHOL 134  HDL 34*  LDLCALC 65  TRIG 782*  CHOLHDL 3.9  LDLDIRECT --    Imaging: Dg Chest Portable 1 View  01/03/2012   *RADIOLOGY REPORT*  Clinical Data: Intermittent chest pain for 2 weeks.  History of open heart surgery.  PORTABLE CHEST - 1 VIEW  Comparison: 10/06/2011 and 09/29/2011.  Findings: 0944 hours.  The heart size and mediastinal contours are stable status post CABG.  There is stable atelectasis or scarring in both lung bases.  No confluent airspace opacity, edema or significant pleural effusion is identified.  IMPRESSION: Stable examination.  No acute cardiopulmonary process.  Original Report Authenticated By: Gerrianne Scale, M.D.    Cardiac Studies:  ECG:  4/9  NSR rate 69 no ischemic changes   Telemetry: NSR  Echo:   Medications:     . amiodarone  100 mg Oral Daily  . aspirin  324 mg Oral NOW   Or  . aspirin  300 mg Rectal NOW  . aspirin  81 mg Oral Once  . Chlorhexidine Gluconate Cloth  6 each Topical Q0600  . furosemide  40 mg Intravenous Once  . insulin aspart  0-5 Units Subcutaneous QHS  . insulin aspart  0-9 Units Subcutaneous TID WC  . metoprolol  25 mg Oral Once  .  metoprolol tartrate  25 mg Oral BID  . mupirocin ointment  1 application Nasal BID  . nitroGLYCERIN  1 inch Topical Once  . pantoprazole  40 mg Oral Q1200  . phytonadione (VITAMIN K) IV  0.5 mg Intravenous Once  . sodium chloride  3 mL Intravenous Q12H  . DISCONTD: amiodarone  200 mg Oral Daily       Assessment/Plan:  Anemia:  GI to see on coumadin for PAF  Recurrent anemia and GI bleed Will stop coumadin PAF:  In NSR Continue beta blocker and low dose amiodarone Weakness:  Likely related to anemia.  Improved with transfusion.  Charlton Haws 01/04/2012, 10:03 AM

## 2012-01-04 NOTE — Progress Notes (Signed)
UR Completed. Simmons, Blayklee Mable F [phone]  

## 2012-01-04 NOTE — Consult Note (Signed)
[facility] Consult: 11:27 AM 01/04/2012   Referring Provider: Charlton Haws Primary Care Physician:  [name], MD, MD Primary Gastroenterologist:  Dr. Sharia Reeve  Reason for Consultation:  GI Bleed  HPI: [name] is a 78 y.o., insulin requiring diabetic male.  Takes daily Coumadin for A Fib. He was admitted 01/03/12, yesterday, with 6 months of weakness, 3 weeks progressive dyspnea, acute "elephant sitting on chest" pressure 1 AM on 01/03/12.  Transfused one unit PRBC at [facility], one unit at [facility] hospital for Hgb of 7.7 (normal MCV).  INR was 3.1 and received 0.5 MG IV vitamin K.  Pt's weakness is better. Dr Eden Emms has stopped coumadin but pt is receiving low dose aspirin.   Pt is FOB negative on test 01/03/12.  Pt underwent EGD and colonoscopy in early January 2013 for Hgb of 7.9, passing black stools, INR supratherapeutic at 5.0.   Studies showed 8 colon polyps largest 1.6 cm (none removed due to anticoagulation), colon AVM, colon tics, antral and esophageal erosions.  Serum H Pylori antibody was in equivocal range. He received 2 units of blood during that admisssion.  Hgb was 10.8 at discharge.  Never pursued suggested capsule endoscopy due to pt's general illness and wife's illness.    Home meds include no PPI etc.  And no GI protective meds were listed on d/c med list of 10/09/11.  He has no GI problems.  Stools are brown, no visible blood PR.  No upset stomach, anorexia, dysphagia, weight loss.  No large hematomas but does get a lot of purpura on hands and forearms. No hematuria.     Past Medical History  Diagnosis Date  . Hypertension   . COPD (chronic obstructive pulmonary disease)   . Myocardial infarction   . Arthritis   . Atrial fibrillation   . Dysphagia   . Blood transfusion   . Carotid stenosis     Dr Waverly Ferrari follows.     Past Surgical History  Procedure Date  . Knee surgery 1960    Right knee  cartilage  . Rotator cuff repair 1990    right   . Coronary artery bypass graft 1996    4 by-pass  . Inguinal hernia repair 2000    left  . Cholecystectomy 05/2010    Lap chole with biologic mesh repair/reinforcement by  Dr Zachery Dakins  . Back surgery 02/2006    ruptured disk,  post op diskitis infection requiring prolonged hospital stay and  surgical I & D  . Tonsillectomy   . Esophagogastroduodenoscopy 10/08/2011    Procedure: ESOPHAGOGASTRODUODENOSCOPY (EGD);  Surgeon: Malissa Hippo, MD;  [facility];  Service: Endoscopy;  Laterality: N/A;  . Colonoscopy 10/08/2011    Procedure: COLONOSCOPY;  Surgeon: Malissa Hippo, MD;  Location: [facility];  Service: Endoscopy;  Laterality: N/A;  . Cataract extraction, bilateral   . Peg placement 07/2006    placed due to prolonged infection, poor po intake, malnutrition during several week hospital stay resulting from ruptured disc that became infected following surgery    Prior to Admission medications   Medication Sig Start Date End Date Taking? Authorizing Provider  acetaminophen (TYLENOL) 500 MG tablet Take 1,000 mg by mouth every 6 (six) hours as needed. Pain   Yes Historical Provider, MD  amiodarone (PACERONE) 200 MG tablet Take 0.5 tablets (100 mg total) by mouth daily. 10/08/11  Yes [name], MD  amLODipine (NORVASC) 5 MG tablet TAKE ONE TABLET BY  MOUTH EVERY DAY FOR BLOOD PRESSURE 11/08/11  Yes Pricilla Riffle, MD  aspirin 81 MG tablet Take 81 mg by mouth daily. Takes 5 mg tablet on Mondays, Tuesdays, Wednesdays, and Thursdays   Yes Historical Provider, MD  HYDROcodone-acetaminophen (NORCO) 5-325 MG per tablet Take 1 tablet by mouth every 6 (six) hours as needed. Pain   Yes Historical Provider, MD  metFORMIN (GLUCOPHAGE) 500 MG tablet Take 500 mg by mouth 2 (two) times daily with a meal.   Yes Historical Provider, MD  metoprolol tartrate (LOPRESSOR) 25 MG tablet Take 25 mg by mouth 2 (two) times daily.     Yes Historical  Provider, MD  Multiple Vitamin (MULTIVITAMIN) tablet Take 1 tablet by mouth daily.     Yes Historical Provider, MD  pregabalin (LYRICA) 50 MG capsule Take 50 mg by mouth daily.    Yes Historical Provider, MD  Red Yeast Rice 600 MG TABS Take 1 tablet by mouth daily.     Yes Historical Provider, MD  silodosin (RAPAFLO) 8 MG CAPS capsule Take 8 mg by mouth daily with breakfast.     Yes Historical Provider, MD  warfarin (COUMADIN) 5 MG tablet Take 5 mg by mouth daily.   Yes Historical Provider, MD  warfarin (COUMADIN) 7.5 MG tablet Take 7.5 mg by mouth every 7 (seven) days. Takes 7.5mg  only on Fridays and takes at bedtime   Yes Historical Provider, MD  diazepam (VALIUM) 5 MG tablet Take 5 mg by mouth every 6 (six) hours as needed. Muscles Spasms    Historical Provider, MD    Scheduled Meds:    . amiodarone  100 mg Oral Daily  . aspirin  324 mg Oral NOW   Or  . aspirin  300 mg Rectal NOW  . Chlorhexidine Gluconate Cloth  6 each Topical Q0600  . furosemide  40 mg Intravenous Once  . insulin aspart  0-5 Units Subcutaneous QHS  . insulin aspart  0-9 Units Subcutaneous TID WC  . metoprolol tartrate  25 mg Oral BID  . mupirocin ointment  1 application Nasal BID  . pantoprazole  40 mg Oral Q1200  . phytonadione (VITAMIN K) IV  0.5 mg Intravenous Once  . sodium chloride  3 mL Intravenous Q12H   Infusions:   PRN Meds: acetaminophen, albuterol, nitroGLYCERIN, ondansetron (ZOFRAN) IV, sodium chloride, DISCONTD: sodium chloride   Allergies as of 01/03/2012 - Review Complete 01/03/2012  Allergen Reaction Noted  . Statins Other (See Comments) 06/09/2011    Family History  Problem Relation Age of Onset  . Heart disease Sister   . Heart disease Brother   . Coronary artery disease Mother   . Cancer Father     stomach  . Anesthesia problems Neg Hx   . Hypotension Neg Hx   . Malignant hyperthermia Neg Hx   . Pseudochol deficiency Neg Hx     History   Social History  . Marital Status:  Married    Spouse Name: N/A    Number of Children: N/A  . Years of Education: N/A   Occupational History  . Not on file.   Social History Main Topics  . Smoking status: Former Smoker -- 2.0 packs/day for 40 years    Types: Cigarettes    Quit date: 09/27/1990  . Smokeless tobacco: Not on file  . Alcohol Use: No  . Drug Use: No  . Sexually Active: Not Currently   Other Topics Concern  . Not on file   Social History Narrative  .  No narrative on file    REVIEW OF SYSTEMS: Constitutional:  Weakness. Stable weight ENT:  No nose bleeds Pulm:  dyspnea CV:  No palpitations, no pedal edema GU:  Nocturia 2 times nightly.  No hematuria GI:  See HPI Heme:  seeHPI.    Transfusions:  See HPI Neuro:  Tingling, numbness in feet,toes is chronic Derm:  No non-healing sores.  Hx of precancerous skin lesion removal. Endocrine:  No sweats, chills, excessive thirst. Immunization:  Flu vaccine current Travel:  None beyond region.    PHYSICAL EXAM: Vital signs in last 24 hours: Temp:  [97 F (36.1 C)-98.3 F (36.8 C)] 98.2 F (36.8 C) (04/09 0700) Pulse Rate:  [66-77] 71  (04/09 1013) Resp:  [15-29] 20  (04/09 0700) BP: (120-151)/(52-86) 128/54 mmHg (04/09 1013) SpO2:  [93 %-98 %] 97 % (04/09 0700) FiO2 (%):  [2 %] 2 % (04/08 2000) Weight:  [215 lb 9.8 oz (97.8 kg)] 215 lb 9.8 oz (97.8 kg) (04/09 0055)  General: Obese, robust but not well-appearing elderly wm. Head:  No facial assymetry.  Eyes:  No conj pallor.  EOMI Ears:  HOH  Nose:  No discharge or congestion. Mouth:  Full pair of dentures in place. Moist, pink MM Neck:  No JVD.  Faint bruit on left.  No JVD, no mass, no TMG. Lungs:  Clear.  No labored breathing Heart: RRR, rate in 70s. Abdomen:  Soft, large, protuberant, NT.  No HSM, masses, bruits.  No hernias. Well-healed scar at umbilicus.   Rectal: did not perform.  FOB negative yesterday.    Musc/Skeltl: no joint swelling.  Extremities:  No pedal edema.  Neurologic:   Pleasant, HOH, no tremor, moves all 4 limbs easily.  Oriented x 3.  Skin:  Excessive solar induced skin damage.  Tattoos:  On left forarm. Nodes:  No adenopathy at neck or groin   Psych:  Pleasant.  Not anxious or agitated.  Intake/Output from previous day: 04/08 0701 - 04/09 0700 In: 990 [P.O.:240; I.V.:750] Out: 3300 [Urine:3300] Intake/Output this shift:    LAB RESULTS:  Basename 01/04/12 0645 01/03/12 1925 01/03/12 0937  WBC 6.7 8.5 6.4  HGB 9.1* 10.1* 7.7*  HCT 28.7* 32.0* 25.2*  PLT 236 281 270   BMET Lab Results  Component Value Date   NA 140 01/04/2012   NA 141 01/03/2012   NA 141 01/03/2012   K 3.6 01/04/2012   K 3.9 01/03/2012   K 3.8 01/03/2012   CL 103 01/04/2012   CL 104 01/03/2012   CL 104 01/03/2012   CO2 28 01/04/2012   CO2 24 01/03/2012   CO2 24 01/03/2012   GLUCOSE 159* 01/04/2012   GLUCOSE 146* 01/03/2012   GLUCOSE 254* 01/03/2012   BUN 17 01/04/2012   BUN 17 01/03/2012   BUN 19 01/03/2012   CREATININE 1.28 01/04/2012   CREATININE 1.26 01/03/2012   CREATININE 1.29 01/03/2012   CALCIUM 9.1 01/04/2012   CALCIUM 9.4 01/03/2012   CALCIUM 9.5 01/03/2012   LFT  Basename 01/03/12 1022  PROT 7.3  ALBUMIN 3.8  AST 25  ALT 13  ALKPHOS 64  BILITOT 0.4  BILIDIR <0.1  IBILI NOT CALCULATED   PT/INR Lab Results  Component Value Date   INR 2.46* 01/04/2012   INR 3.18* 01/03/2012   INR 2.93* 01/03/2012   Hepatitis Panel No results found for this basename: HEPBSAG,HCVAB,HEPAIGM,HEPBIGM in the last 72 hours   RADIOLOGY STUDIES: Dg Chest Portable 1 View  01/03/2012  *RADIOLOGY REPORT*  Clinical Data: Intermittent chest pain for 2 weeks.  History of open heart surgery.  PORTABLE CHEST - 1 VIEW  Comparison: 10/06/2011 and 09/29/2011.  Findings: 0944 hours.  The heart size and mediastinal contours are stable status post CABG.  There is stable atelectasis or scarring in both lung bases.  No confluent airspace opacity, edema or significant pleural effusion is identified.  IMPRESSION: Stable examination.   No acute cardiopulmonary process.  Original Report Authenticated By: Gerrianne Scale, M.D.    ENDOSCOPIC STUDIES: EGD   10/06/2011         Dr Karilyn Cota Findings:  Esophagus: Cosa of the esophagus was normal except single erosion just proximal to GE junction without stigmata of bleed.  GEJ: 44 cm  Stomach: Stomach was empty and distended very well with insufflation. Folds in the proximal stomach were normal. Examination of mucosa revealed 2 small erosions at antrum without stigmata of bleed and antral scar. Pyloric channel was patent. Angularis fundus and cardia was examined by retroflexing the scope and was normal.  Duodenum: Normal bulbar and post bulbar mucosa  Colonoscopy   10/06/2011       Dr Karilyn Cota Findings:  Prep satisfactory.  Moderate number of diverticula at sigmoid colon.  2 polyps noted at cecum. One was about 4 mm and the other one was larger approximately 8 x 12 mm.  6 small polyps noted predominantly in the transverse colon. One of these was 15-16 mm and the other ones were smaller in diameter.  Single small AV malformation at mid transverse colon.  Normal rectal mucosa and anorectal junction.    Therapeutic/Diagnostic Maneuvers Performed: Polypectomy not performed because patient's INR not in desirable range.   Dr Patty Sermons Summary regarding above procedures.  Impression:  Multiple findings but no definite lesion identified to account for patient's GI blood loss.  Single erosion at distal esophagus.  2 antral erosions with a scar but no stigmata of bleeding.  Moderate sigmoid diverticulosis.  Multiple polyps none of which were removed because of elevated INR but none with stigmata of bleed. 2 polyps were greater than 10 mm in diameter and others were small.  Recommendations:  Advance diet.  H. pylori serology.  Small bowel given capsule study on an outpatient basis.  This has not been done yet, pt never felt well enough to pursue and pt's  Can resume warfarin on 10/12/2011.                                     Wife has been ill in the interim.  Colonoscopy with polypectomy in 3 months.    Ref. Range 10/08/2011 05:30  H Pylori IgG No range found 0.99 (H)    IMPRESSION: 1.  Recurrent anemia, but heme negative and no evidence of acute gi blood loss since Jan 2013.  May have intermittent, sub-clinical losses. S/p transfusion with 2 PRBC.   2.  Chest pressure, resolved along with improvement in weakness and dyspnea following transfusion. Cardiac enzymes negative.  3.  PAF, chronic Coumadin.   4. IDDM 5. Multiple colon polyps, colonic AVM, diverticulosis, esophageal and antral erosions.  Has not needed or been prescribed PPI or H2 blocker meds.  Has not followed up with Dr Karilyn Cota for capsule endoscopy or undergone polypectomy.  6.  Equivocal serum H Pylori levels.  7.  2007 temporary PEG to optimize nutrition during prolonged illness and hospital stay.   PLAN: 1.  Continue the daily po Protonix you have added.  2.   Discussed the case with Dr. Russella Dar.  Per his orders, have arranged GI follow up April 15th with Dr Patty Sermons N.P., [place].  Capsule endoscopies should be pursued in out pt setting whenever possible, as ambulatory studies are superior to inpt studies.  3.  Consider hemetology evaluation.    LOS: 1 day   Jennye Moccasin  01/04/2012, 11:27 AM Pager: [phone]

## 2012-01-05 DIAGNOSIS — R079 Chest pain, unspecified: Secondary | ICD-10-CM

## 2012-01-05 LAB — GLUCOSE, CAPILLARY
Glucose-Capillary: 179 mg/dL — ABNORMAL HIGH (ref 70–99)
Glucose-Capillary: 152 mg/dL — ABNORMAL HIGH (ref 70–99)

## 2012-01-05 NOTE — Consult Note (Signed)
Consult for: Gross hematuria Requested by: Dr. Eden Emms  History of Present Illness: 78 yo admitted for weakness, SOB and anemia progressive over several months. He had a foley placed two days ago and developed gross hematuria. The urine was draining through the foley and around it per the pt. His urine did clear. The foley was removed yesterday and he has been voiding without difficulty. His urine has cleared and is yellow. He is a remote smoker.  Patient had a CT A/P in 2011 - I reviewed all the images. This showed a very large prostate, but otw a normal GU tract.  He denies prior gross hematuria. He denies h/o BPH, bothersome urinary symptoms or GU surgery. He has not had dysuria. He voids with a good stream. He has no frequency or urgency.   Past Medical History  Diagnosis Date  . Hypertension   . COPD (chronic obstructive pulmonary disease)   . Myocardial infarction   . Arthritis   . Atrial fibrillation   . Dysphagia   . Blood transfusion   . Carotid stenosis     Dr Waverly Ferrari follows.    Past Surgical History  Procedure Date  . Knee surgery 1960    Right knee cartilage  . Rotator cuff repair 1990    right   . Coronary artery bypass graft 1996    4 by-pass  . Inguinal hernia repair 2000    left  . Cholecystectomy 05/2010    Lap chole with biologic mesh repair/reinforcement by  Dr Zachery Dakins  . Back surgery 02/2006    ruptured disk,  post op diskitis infection requiring prolonged hospital stay and  surgical I & D  . Tonsillectomy   . Esophagogastroduodenoscopy 10/08/2011    Procedure: ESOPHAGOGASTRODUODENOSCOPY (EGD);  Surgeon: Malissa Hippo, MD;  [facility];  Service: Endoscopy;  Laterality: N/A;  . Colonoscopy 10/08/2011    Procedure: COLONOSCOPY;  Surgeon: Malissa Hippo, MD;  [facility];  Service: Endoscopy;  Laterality: N/A;  . Cataract extraction, bilateral   . Peg placement 07/2006    placed due to prolonged infection, poor po  intake, malnutrition during several week hospital stay resulting from ruptured disc that became infected following surgery    Home Medications:  Prescriptions prior to admission  Medication Sig Dispense Refill  . acetaminophen (TYLENOL) 500 MG tablet Take 1,000 mg by mouth every 6 (six) hours as needed. Pain      . amiodarone (PACERONE) 200 MG tablet Take 0.5 tablets (100 mg total) by mouth daily.  30 tablet  5  . amLODipine (NORVASC) 5 MG tablet TAKE ONE TABLET BY MOUTH EVERY DAY FOR BLOOD PRESSURE  90 tablet  0  . aspirin 81 MG tablet Take 81 mg by mouth daily. Takes 5 mg tablet on Mondays, Tuesdays, Wednesdays, and Thursdays      . HYDROcodone-acetaminophen (NORCO) 5-325 MG per tablet Take 1 tablet by mouth every 6 (six) hours as needed. Pain      . metFORMIN (GLUCOPHAGE) 500 MG tablet Take 500 mg by mouth 2 (two) times daily with a meal.      . metoprolol tartrate (LOPRESSOR) 25 MG tablet Take 25 mg by mouth 2 (two) times daily.        . Multiple Vitamin (MULTIVITAMIN) tablet Take 1 tablet by mouth daily.        . pregabalin (LYRICA) 50 MG capsule Take 50 mg by mouth daily.       Adele Schilder Yeast Rice  600 MG TABS Take 1 tablet by mouth daily.        . silodosin (RAPAFLO) 8 MG CAPS capsule Take 8 mg by mouth daily with breakfast.        . warfarin (COUMADIN) 5 MG tablet Take 5 mg by mouth daily.      Marland Kitchen warfarin (COUMADIN) 7.5 MG tablet Take 7.5 mg by mouth every 7 (seven) days. Takes 7.5mg  only on Fridays and takes at bedtime      . diazepam (VALIUM) 5 MG tablet Take 5 mg by mouth every 6 (six) hours as needed. Muscles Spasms       Allergies:  Allergies  Allergen Reactions  . Statins Other (See Comments)    Causes legs to hurt.     Family History  Problem Relation Age of Onset  . Heart disease Sister   . Heart disease Brother   . Coronary artery disease Mother   . Cancer Father     stomach  . Anesthesia problems Neg Hx   . Hypotension Neg Hx   . Malignant hyperthermia Neg Hx   .  Pseudochol deficiency Neg Hx    Social History:  reports that he quit smoking about 21 years ago. His smoking use included Cigarettes. He has a 80 pack-year smoking history. He does not have any smokeless tobacco history on file. He reports that he does not drink alcohol or use illicit drugs.  ROS: A complete review of systems was performed.  All systems are negative except for pertinent findings as noted. @ROS @  Physical Exam:  Vital signs in last 24 hours: Temp:  [97.1 F (36.2 C)-98.6 F (37 C)] 97.4 F (36.3 C) (04/10 2010) Pulse Rate:  [66-73] 72  (04/10 2010) Resp:  [10-21] 16  (04/10 2010) BP: (121-140)/(47-60) 140/58 mmHg (04/10 2010) SpO2:  [94 %-100 %] 97 % (04/10 2010) FiO2 (%):  [28 %] 28 % (04/10 0517) Weight:  [96.5 kg (212 lb 11.9 oz)] 96.5 kg (212 lb 11.9 oz) (04/10 0052) General:  Alert and oriented, No acute distress HEENT: Normocephalic, atraumatic Neck: No JVD or lymphadenopathy Cardiovascular: Regular rate and rhythm Lungs: Clear bilaterally Abdomen: Soft, nontender, nondistended, no abdominal masses, bladder not distended Back: No CVA tenderness Genitourinary:  Normal male phallus, testes are descended bilaterally and non-tender and without masses, scrotum is normal in appearance without lesions or masses, perineum is normal on inspection. Urinal on bedside table - urine is clear / yellow and no clots. Rectal: Normal sphincter tone, no rectal masses, prostate is non tender and without nodularity. Prostate size is estimated to be 80 cc Extremities: No edema Neurologic: Grossly intact  Laboratory Data:  Results for orders placed during the hospital encounter of 01/03/12 (from the past 24 hour(s))  GLUCOSE, CAPILLARY     Status: Abnormal   Collection Time   01/04/12 10:19 PM      Component Value Range   Glucose-Capillary 179 (*) 70 - 99 (mg/dL)   Comment 1 Documented in Chart     Comment 2 Notify RN    GLUCOSE, CAPILLARY     Status: Abnormal   Collection  Time   01/05/12  8:19 AM      Component Value Range   Glucose-Capillary 152 (*) 70 - 99 (mg/dL)   Comment 1 Notify RN    GLUCOSE, CAPILLARY     Status: Abnormal   Collection Time   01/05/12 12:51 PM      Component Value Range   Glucose-Capillary 167 (*) 70 -  99 (mg/dL)   Comment 1 Notify RN    GLUCOSE, CAPILLARY     Status: Abnormal   Collection Time   01/05/12  5:24 PM      Component Value Range   Glucose-Capillary 143 (*) 70 - 99 (mg/dL)   Comment 1 Notify RN     Recent Results (from the past 240 hour(s))  MRSA PCR SCREENING     Status: Abnormal   Collection Time   01/03/12  4:36 PM      Component Value Range Status Comment   MRSA by PCR POSITIVE (*) NEGATIVE  Final    Creatinine:  Basename 01/04/12 0645 01/03/12 1925 01/03/12 0937  CREATININE 1.28 1.26 1.29    Impression/Assessment:  BPH Gross Hematuria  Plan:  -Given his large prostate on exam and CT, his hematuria is likely from the foley placement.  -Pt voiding on his own and urine has cleared. No urologic intervention needed. -From a GU point of view, he could probably restart coumadin as he has never had a history of gross hematuria. Not likely anemia GU related.  -If pt has no further gross hematuria and f/u UA is clear I dont think he needs further work-up. -I gave patient my contact info and instructed him to F/u if he develops gross hematuria in the future.  Antony Haste 01/05/2012, 9:18 PM

## 2012-01-05 NOTE — Progress Notes (Signed)
Foley Cath uncomfortable to pt, bloody urine noted, foley cath discontinued, condom cath applied, will continue to monitor pt. Adria Dill RN

## 2012-01-05 NOTE — Progress Notes (Addendum)
Contacted [facility] at [phone] to see pt, spoke with Bjorn Loser at their office, they will see.  Contacted by Dr Mena Goes - MD advised that if pt able to urinate, ck post-void residual to make sure he is able to empty his bladder and contact MD if pt having clots or unable to void. No further workup for hematuria at this time, but pt should get a UA in about a week and see Urology or Primary MD if still has hematuria. If pt has trouble voiding, re-insert foley and d/c with this in place, to follow up within a week as an outpatient.  Dr Mena Goes cell - [phone], MD call with questions PRN.

## 2012-01-05 NOTE — Progress Notes (Signed)
Patient ID: [name], male   DOB: 1932-09-05, 78 y.o.   MRN: [id number]    @ Subjective:  Denies SSCP, palpitations or Dyspnea Gross hematuria from traumatic foley attempt at [facility]   Objective:  Filed Vitals:   01/05/12 0052 01/05/12 0442 01/05/12 0517 01/05/12 0700  BP: 133/52 121/47  138/49  Pulse: 66 70  70  Temp: 97.1 F (36.2 C) 98.1 F (36.7 C)  98.5 F (36.9 C)  TempSrc: Axillary Axillary  Axillary  Resp: 19 10  16   Height:      Weight: 212 lb 11.9 oz (96.5 kg)     SpO2: 99% 98% 100% 98%    Intake/Output from previous day:  Intake/Output Summary (Last 24 hours) at 01/05/12 4098 Last data filed at 01/05/12 0700  Gross per 24 hour  Intake      0 ml  Output   2051 ml  Net  -2051 ml    Physical Exam: Pale Lungs clear SEM BS positive Plus 2 PT Trace edema Neuro nonfocal  Lab Results: Basic Metabolic Panel:  [facility] 01/04/12 0645 01/03/12 1925  NA 140 141  K 3.6 3.9  CL 103 104  CO2 28 24  GLUCOSE 159* 146*  BUN 17 17  CREATININE 1.28 1.26  CALCIUM 9.1 9.4  MG -- --  PHOS -- --   Liver Function Tests:  Basename 01/03/12 1022  AST 25  ALT 13  ALKPHOS 64  BILITOT 0.4  PROT 7.3  ALBUMIN 3.8   No results found for this basename: LIPASE:2,AMYLASE:2 in the last 72 hours CBC:  Basename 01/04/12 0645 01/03/12 1925 01/03/12 0937  WBC 6.7 8.5 --  NEUTROABS -- 4.9 3.7  HGB 9.1* 10.1* --  HCT 28.7* 32.0* --  MCV 82.0 81.8 --  PLT 236 281 --   Cardiac Enzymes:  Basename 01/04/12 0645 01/04/12 0056 01/03/12 1925  CKTOTAL 53 54 68  CKMB 1.9 2.1 2.4  CKMBINDEX -- -- --  TROPONINI 0.30* 0.34* <0.30   BNP: No components found with this basename: POCBNP:3 D-Dimer: No results found for this basename: DDIMER:2 in the last 72 hours Hemoglobin A1C:  Basename 01/03/12 2122  HGBA1C 6.7*   Fasting Lipid Panel:  Basename 01/04/12 0645  CHOL 134  HDL 34*  LDLCALC 65  TRIG 119*  CHOLHDL 3.9  LDLDIRECT --    Imaging: Dg Chest  Portable 1 View  01/03/2012  *RADIOLOGY REPORT*  Clinical Data: Intermittent chest pain for 2 weeks.  History of open heart surgery.  PORTABLE CHEST - 1 VIEW  Comparison: 10/06/2011 and 09/29/2011.  Findings: 0944 hours.  The heart size and mediastinal contours are stable status post CABG.  There is stable atelectasis or scarring in both lung bases.  No confluent airspace opacity, edema or significant pleural effusion is identified.  IMPRESSION: Stable examination.  No acute cardiopulmonary process.  Original Report Authenticated By: Gerrianne Scale, M.D.    Cardiac Studies:  ECG:  4/9  NSR rate 69 no ischemic changes   Telemetry: NSR  Echo:   Medications:      . amiodarone  100 mg Oral Daily  . Chlorhexidine Gluconate Cloth  6 each Topical Q0600  . insulin aspart  0-5 Units Subcutaneous QHS  . insulin aspart  0-9 Units Subcutaneous TID WC  . metoprolol tartrate  25 mg Oral BID  . mupirocin ointment  1 application Nasal BID  . pantoprazole  40 mg Oral Q1200  . sodium chloride  3 mL Intravenous  Q12H       Assessment/Plan:  Anemia:  Coumadin stopped.  GI recommends PPI and F/U with [facility] in [place]   PAF:  In NSR Continue beta blocker and low dose amiodarone No coumadin due to anemia and hematuria Weakness:  Likely related to anemia.  Improved with transfusion. Hematuria  Urology to see   Tentative D/C in am if stable  Charlton Haws 01/05/2012, 8:22 AM

## 2012-01-06 ENCOUNTER — Encounter (HOSPITAL_COMMUNITY): Payer: Self-pay | Admitting: Cardiology

## 2012-01-06 DIAGNOSIS — I2089 Other forms of angina pectoris: Secondary | ICD-10-CM

## 2012-01-06 DIAGNOSIS — I208 Other forms of angina pectoris: Secondary | ICD-10-CM

## 2012-01-06 LAB — CBC
MCH: 26.2 pg (ref 26.0–34.0)
Platelets: 227 10*3/uL (ref 150–400)
RBC: 3.62 MIL/uL — ABNORMAL LOW (ref 4.22–5.81)
WBC: 5.7 10*3/uL (ref 4.0–10.5)

## 2012-01-06 LAB — GLUCOSE, CAPILLARY
Glucose-Capillary: 132 mg/dL — ABNORMAL HIGH (ref 70–99)
Glucose-Capillary: 157 mg/dL — ABNORMAL HIGH (ref 70–99)

## 2012-01-06 MED ORDER — NITROGLYCERIN 0.4 MG SL SUBL: 0.4000 mg | SUBLINGUAL_TABLET | SUBLINGUAL | Status: DC | PRN

## 2012-01-06 MED ORDER — PANTOPRAZOLE SODIUM 40 MG PO TBEC: 40.0000 mg | DELAYED_RELEASE_TABLET | Freq: Every day | ORAL | Status: DC

## 2012-01-06 NOTE — Progress Notes (Signed)
Patient ID: [name], male   DOB: 1932-05-24, 78 y.o.   MRN: [id number]     Subjective:  Denies SSCP, palpitations or Dyspnea Gross hematuria from traumatic foley attempt at [facility] Hematuria Cleared and no obstructie symptoms   Objective:  Filed Vitals:   01/05/12 2010 01/06/12 0100 01/06/12 0535 01/06/12 0540  BP: 140/58 140/55 124/50   Pulse: 72 69 66   Temp: 97.4 F (36.3 C) 98.2 F (36.8 C) 97.6 F (36.4 C)   TempSrc: Oral Oral Oral   Resp: 16 15 21    Height:      Weight:  209 lb 4.8 oz (94.938 kg)    SpO2: 97% 95% 96% 98%    Intake/Output from previous day:  Intake/Output Summary (Last 24 hours) at 01/06/12 0805 Last data filed at 01/05/12 2100  Gross per 24 hour  Intake    243 ml  Output   1102 ml  Net   -859 ml    Physical Exam: Pale Lungs clear SEM BS positive Plus 2 PT Trace edema Neuro nonfocal  Lab Results: Basic Metabolic Panel:  Basename 01/04/12 0645 01/03/12 1925  NA 140 141  K 3.6 3.9  CL 103 104  CO2 28 24  GLUCOSE 159* 146*  BUN 17 17  CREATININE 1.28 1.26  CALCIUM 9.1 9.4  MG -- --  PHOS -- --   Liver Function Tests:  Basename 01/03/12 1022  AST 25  ALT 13  ALKPHOS 64  BILITOT 0.4  PROT 7.3  ALBUMIN 3.8   No results found for this basename: LIPASE:2,AMYLASE:2 in the last 72 hours CBC:  Basename 01/04/12 0645 01/03/12 1925 01/03/12 0937  WBC 6.7 8.5 --  NEUTROABS -- 4.9 3.7  HGB 9.1* 10.1* --  HCT 28.7* 32.0* --  MCV 82.0 81.8 --  PLT 236 281 --   Cardiac Enzymes:  Basename 01/04/12 0645 01/04/12 0056 01/03/12 1925  CKTOTAL 53 54 68  CKMB 1.9 2.1 2.4  CKMBINDEX -- -- --  TROPONINI 0.30* 0.34* <0.30   BNP: No components found with this basename: POCBNP:3 D-Dimer: No results found for this basename: DDIMER:2 in the last 72 hours Hemoglobin A1C:  Basename 01/03/12 2122  HGBA1C 6.7*   Fasting Lipid Panel:  Basename 01/04/12 0645  CHOL 134  HDL 34*  LDLCALC 65  TRIG 409*  CHOLHDL 3.9    LDLDIRECT --    Imaging: No results found.  Cardiac Studies:  ECG:  4/9  NSR rate 69 no ischemic changes   Telemetry: NSR  Echo:   Medications:      . amiodarone  100 mg Oral Daily  . Chlorhexidine Gluconate Cloth  6 each Topical Q0600  . insulin aspart  0-5 Units Subcutaneous QHS  . insulin aspart  0-9 Units Subcutaneous TID WC  . metoprolol tartrate  25 mg Oral BID  . mupirocin ointment  1 application Nasal BID  . pantoprazole  40 mg Oral Q1200  . sodium chloride  3 mL Intravenous Q12H       Assessment/Plan:  Anemia:  Coumadin stopped.  GI recommends PPI and F/U with [facility] in [place]  Guaic negative PAF:  In NSR Continue beta blocker and low dose amiodarone No coumadin due to anemia and hematuria Weakness:  Likely related to anemia.  Improved with transfusion. Hematuria  Urology would not come to hospital.  Cleared and should be ok off coumadin.    81mg  ASA no coumadin  D/C home  F/U primary [facility] and Dr  Ross.  ? In [facility]    Charlton Haws 01/06/2012, 8:05 AM

## 2012-01-06 NOTE — Discharge Summary (Signed)
Discharge Summary   Patient ID: [name] MRN: [id number], DOB/AGE: 1932/04/12 78 y.o.  Primary MD: [name], MD Primary Cardiologist: Dr. Dietrich Pates Admit date: 01/03/2012 D/C date:     01/06/2012      Primary Discharge Diagnoses:  1. Chest Pain  - Likely demand ischemia in the setting of anemia  - Improved with blood transfusion  - Cardiac enzymes mildly abnormal (troponin <0.30-->0.34-->0.30)  - No further ischemic evaluation this admission 2. Anemia  - Guaiac negative, Coumadin stopped  - Received 2units PRBCs, H&H 7.7/25.2 --> 9.5/30.4  - GI  recommended PPI and f/u w/ Dr. Karilyn Cota in [place] 3. Hematuria  - Felt 2/2 traumatic foley insertion and not a source of anemia  - Urology (Dr. Mena Goes) recommended he get a f/u UA in 1 wk and see Urology as an outpatient if has recurrent hematuria or trouble voiding at home 4. Weakness  - Likely r/t #2, improved with transfusion  Secondary Discharge Diagnoses:  1. CAD s/p 4V CABG '96, Myoview '10 w/o ischemia 2. Atrial Fibrillation - Coumadin dc'd during this admission 2/2 anemia 3. HTN 4. HLD - LDL 65, intolerant to statins 5. Known LV Dysfunction - EF 45-50% by echo 2007 6. Diabetes Mellitus - A1C 6.7% 7. COPD 8. Melena - EGD & Colonoscopy 09/2011 revealed gastritis, erosions, scars, diverticula, 8 colon polyps (largest 1.6cm, not removed 2/2 anticoagulation), small AVM in transverse colon 9. Arthritis 10. H/o Tobacco abuse - quit 1992 11. Right knee surgery 1960 12. Right Rotator cuff repair 1990 13. Left inguinal hernia repair 2000 14. Cholecystectomy 2011 15. Back surgery 2/2 ruptured disc 2007 16. Tonsillectomy 17. Dysphagia  Allergies Allergies  Allergen Reactions  . Statins Other (See Comments)    Causes legs to hurt.     Diagnostic Studies/Procedures:  None  History of Present Illness: 78 y.o. male w/ the above medical problems who presented to [facility] with c/o chest pain and  found to be anemic and was subsequently transferred to [facility] on 01/03/12.  Patient stated that over the past 6 months he felt like he was getting weaker and over the past 3 wks he became more sob and had intermittent chest tightness with activity. The night prior to presentation he woke up at 1 AM feeling like an "elephant sitting on chest" and went to [facility] ER.  Hospital Course: At [facility] his EKG revealed NSR with ST depression in II, III, V4-6. CXR was without acute cardiopulmonary abnormalities. Labs were significant for poc troponin 0.13, H&H 7.7/25.2 (normal MCV), INR 3.07, Glucose 254, negative occult stool. He was transfused w/ 1u PRBCs and then transferred to [facility] for further evaluation and treatment.  At [facility] his coumadin was held and reversed with vitamin K. He received an additional 1u PRBCs. Subsequent H&H was 10.1/32.0 after transfusion and 9.5/30.4 on day of discharge. Cardiac enzymes were cycled and only mildly abnormal (troponin <0.30-->0.34-->0.30). His weakness and chest pain improved with blood transfusion and it was felt his chest pain was due to demand ischemia in the setting of anemia. He was evaluated by GI who reviewed his EGD and colonoscopy results from January. They did not feel he had an acute GI blood loss at this time, but may have intermittent, sub-clinical losses. They recommended a daily PPI and f/u with Dr. Patty Sermons NP, Dorene Ar, on April 15th to consider capsule endoscopy. Urology evaluated him for hematuria and felt it was likely from traumatic foley  insertion. His urine cleared and it is recommended he have a follow up urinalysis with his PCP in 1wk or sooner if he has recurrent hematuria or difficulty voiding.  He was seen and evaluated by Dr. Eden Emms who felt he was stable for discharge home with plans for follow up as scheduled below.  Discharge Vitals: Blood pressure 120/60, pulse 65, temperature 98.6 F (37 C), temperature  source Oral, resp. rate 24, height 5' 11.5" (1.816 m), weight 209 lb 4.8 oz (94.938 kg), SpO2 96.00%.  Labs: Component Value Date   WBC 5.7 01/06/2012   HGB 9.5* 01/06/2012   HCT 30.4* 01/06/2012   MCV 84.0 01/06/2012   PLT PENDING 01/06/2012     01/04/2012 06:45  Prothrombin Time 27.1 (H)  INR 2.46 (H)   Lab 01/04/12 0645 01/03/12 1022  NA 140 --  K 3.6 --  CL 103 --  CO2 28 --  BUN 17 --  CREATININE 1.28 --  CALCIUM 9.1 --  PROT -- 7.3  BILITOT -- 0.4  ALKPHOS -- 64  ALT -- 13  AST -- 25  GLUCOSE 159* --   Basename 01/04/12 0645 01/04/12 0056 01/03/12 1925  CKTOTAL 53 54 68  CKMB 1.9 2.1 2.4  TROPONINI 0.30* 0.34* <0.30   Component Value Date   CHOL 134 01/04/2012   HDL 34* 01/04/2012   LDLCALC 65 01/04/2012   TRIG 174* 01/04/2012     01/03/2012 19:25 01/04/2012 00:56  Pro B Natriuretic peptide (BNP) 2311.0 (H) 2550.0 (H)     01/03/2012 21:22  Hemoglobin A1C 6.7 (H)     01/03/2012 10:51  Fecal Occult Blood, POC NEGATIVE      Discharge Medications   Medication List  As of 01/06/2012  2:35 PM   STOP taking these medications         warfarin 5 MG tablet      warfarin 7.5 MG tablet         TAKE these medications         acetaminophen 500 MG tablet   Commonly known as: TYLENOL   Take 1,000 mg by mouth every 6 (six) hours as needed. Pain      amiodarone 200 MG tablet   Commonly known as: PACERONE   Take 0.5 tablets (100 mg total) by mouth daily.      amLODipine 5 MG tablet   Commonly known as: NORVASC   TAKE ONE TABLET BY MOUTH EVERY DAY FOR BLOOD PRESSURE      aspirin 81 MG tablet   Take 81 mg by mouth daily. Takes 5 mg tablet on Mondays, Tuesdays, Wednesdays, and Thursdays      diazepam 5 MG tablet   Commonly known as: VALIUM   Take 5 mg by mouth every 6 (six) hours as needed. Muscles Spasms      HYDROcodone-acetaminophen 5-325 MG per tablet   Commonly known as: NORCO   Take 1 tablet by mouth every 6 (six) hours as needed. Pain      metFORMIN 500 MG  tablet   Commonly known as: GLUCOPHAGE   Take 500 mg by mouth 2 (two) times daily with a meal.      metoprolol tartrate 25 MG tablet   Commonly known as: LOPRESSOR   Take 25 mg by mouth 2 (two) times daily.      multivitamin tablet   Take 1 tablet by mouth daily.      nitroGLYCERIN 0.4 MG SL tablet   Commonly known as: NITROSTAT  Place 1 tablet (0.4 mg total) under the tongue every 5 (five) minutes x 3 doses as needed for chest pain (up to 3 doses).      pantoprazole 40 MG tablet   Commonly known as: PROTONIX   Take 1 tablet (40 mg total) by mouth daily.      pregabalin 50 MG capsule   Commonly known as: LYRICA   Take 50 mg by mouth daily.      RAPAFLO 8 MG Caps capsule   Generic drug: silodosin   Take 8 mg by mouth daily with breakfast.      Red Yeast Rice 600 MG Tabs   Take 1 tablet by mouth daily.            Disposition   Discharge Orders    Future Appointments: Provider: Department: Dept Phone: Center:   01/10/2012 2:15 PM Len Blalock, NP [facility] [phone] None   01/13/2012 8:30 AM [facility] [phone] [facility]   01/27/2012 12:15 PM Pricilla Riffle, MD [facility] [phone] [facility]   06/07/2012 1:00 PM [facility] [phone] [facility]   06/07/2012 2:00 PM Chuck Hint, MD [facility] [phone] [facility]     Future Orders Please Complete By Expires   Diet - low sodium heart healthy      Increase activity slowly      Discharge instructions      Comments:   **PLEASE REMEMBER TO BRING ALL OF YOUR MEDICATIONS TO EACH OF YOUR FOLLOW-UP OFFICE VISITS.  * If you have any blood in your urine or difficulty urinating please notify your primary care physician. Urology recommended you have a follow up urinalysis in one week with your primary care physician.        Follow-up Information    Follow up with Malissa Hippo, MD on 01/10/2012. (2:15 PM for GI follow up.  Will see Dr  Patty Sermons nurse practitioner.  )    [facility] information:   [address], [address] [place] [address] [phone]       Follow up with HAWKINS,EDWARD L, MD. Schedule an appointment as soon as possible for a visit in 1 week.   Contact information:   [address] [place] [address] [phone]       Follow up with Dietrich Pates, MD on 01/27/2012. (12:15)    Contact information:   [facility] [address] [place] [address] [phone]           Outstanding Labs/Studies:  1. Urinalysis in 1 wk with PCP 2. CBC at GI office visit  Duration of Discharge Encounter: Greater than 30 minutes including physician and PA time.  Signed, Lamiyah Schlotter PA-C 01/06/2012, 2:35 PM

## 2012-01-10 ENCOUNTER — Other Ambulatory Visit (INDEPENDENT_AMBULATORY_CARE_PROVIDER_SITE_OTHER): Payer: Self-pay | Admitting: *Deleted

## 2012-01-10 ENCOUNTER — Telehealth (INDEPENDENT_AMBULATORY_CARE_PROVIDER_SITE_OTHER): Payer: Self-pay | Admitting: *Deleted

## 2012-01-10 ENCOUNTER — Encounter (INDEPENDENT_AMBULATORY_CARE_PROVIDER_SITE_OTHER): Payer: Self-pay | Admitting: Internal Medicine

## 2012-01-10 ENCOUNTER — Ambulatory Visit (INDEPENDENT_AMBULATORY_CARE_PROVIDER_SITE_OTHER): Payer: Medicare Other | Admitting: Internal Medicine

## 2012-01-10 VITALS — BP 110/58 | HR 64 | Temp 97.8°F | Ht 71.5 in | Wt 215.0 lb

## 2012-01-10 DIAGNOSIS — D649 Anemia, unspecified: Secondary | ICD-10-CM

## 2012-01-10 DIAGNOSIS — A048 Other specified bacterial intestinal infections: Secondary | ICD-10-CM

## 2012-01-10 MED ORDER — BIS SUBCIT-METRONID-TETRACYC 140-125-125 MG PO CAPS: 3.0000 | ORAL_CAPSULE | Freq: Three times a day (TID) | ORAL | Status: DC

## 2012-01-10 NOTE — Progress Notes (Signed)
Subjective:     Patient ID: [name], male   DOB: 1932-04-05, 78 y.o.   MRN: [id number]  HPI  [name] is a 78 yr old male here today for f/u after being admitted in January to [facility] with an acute GI bleed and anemia in the setting of supratherapeutic INR.  Admission hemoglobin was 7.9. He was transfused with 2 units of PRBCs.  Post transfusion his hemoglobin was 10.8. Hx of colonic polyps. EGD/Colonoscopy:Multiple findings but no definite lesion identified to acct for patient's GI blood loss. Single erosion at distal esophagus. 2 antral erosions with a scar but no stigmata of bleeding. Moderate sigmoid diverticulosis. Multiple polyps none of which were removed because of elevated INR but none with stigmata of bleed. 2 polyps were greater than 10 mm in diameter and others were small.  Will need a colonoscopy with polypectomy in 3 months per Dr. Patty Sermons notes.  Says he is doing okay since discharge. Appetite is good. No weight loss. No abdominal pain. Stools are brown in color. Coumadin is presently on hold.   H. Pylori positive 0.99.  Recently admitted to [facility] last week for anemia.Diagnostic Studies/Procedures:  See below  (Notes from [facility]) None  History of Present Illness: 78 y.o. male w/ the above medical problems who presented to [facility] with c/o chest pain and found to be anemic and was subsequently transferred to [facility] on 01/03/12.  Patient stated that over the past 6 months he felt like he was getting weaker and over the past 3 wks he became more sob and had intermittent chest tightness with activity. The night prior to presentation he woke up at 1 AM feeling like an "elephant sitting on chest" and went to [facility] ER.  Hospital Course: At [facility] his EKG revealed NSR with ST depression in II, III, V4-6. CXR was without acute cardiopulmonary abnormalities. Labs were significant for poc troponin 0.13, H&H 7.7/25.2 (normal MCV), INR 3.07,  Glucose 254, negative occult stool. He was transfused w/ 1u PRBCs and then transferred to [facility] for further evaluation and treatment.  At [facility] his coumadin was held and reversed with vitamin K. He received an additional 1u PRBCs. Subsequent H&H was 10.1/32.0 after transfusion and 9.5/30.4 on day of discharge. Cardiac enzymes were cycled and only mildly abnormal (troponin <0.30-->0.34-->0.30). His weakness and chest pain improved with blood transfusion and it was felt his chest pain was due to demand ischemia in the setting of anemia. He was evaluated by GI who reviewed his EGD and colonoscopy results from January. They did not feel he had an acute GI blood loss at this time, but may have intermittent, sub-clinical losses. They recommended a daily PPI and f/u with Dr. Patty Sermons NP, Dorene Ar, on April 15th to consider capsule endoscopy. Urology evaluated him for hematuria and felt it was likely from traumatic foley insertion. His urine cleared and it is recommended he have a follow up urinalysis with his PCP in 1wk or sooner if he has recurrent hematuria or difficulty voiding.   Review of Systems see hpi.     Objective:   Physical Exam Filed Vitals:   01/10/12 1431  Height: 5' 11.5" (1.816 m)  Weight: 215 lb (97.523 kg)  Alert and oriented. Skin warm and dry. Oral mucosa is moist.   . Sclera anicteric, conjunctivae is pink. Thyroid not enlarged. No cervical lymphadenopathy. Lungs clear. Heart regular rate and rhythm.  Abdomen is soft. Bowel  sounds are positive. No hepatomegaly. No abdominal masses felt. No tenderness. 1+ edema to lower extremities. Patient is alert and oriented.       Assessment:   Anemia. H. Pylori positive. Source of blood loss has not been found.  I discussed this case with Dr. Karilyn Cota.    Plan:   Given capsule. Colonoscopy with polypectomy by Dr. Karilyn Cota.  Pylera Rx e-prescribed to pharmacy for his  H.pylori.

## 2012-01-10 NOTE — Patient Instructions (Signed)
Given capsule and colonoscopy with polypectomy.

## 2012-01-10 NOTE — Telephone Encounter (Signed)
Patient given sample of movi prep

## 2012-01-10 NOTE — Telephone Encounter (Signed)
Patient needs movi prep 

## 2012-01-13 ENCOUNTER — Encounter (HOSPITAL_COMMUNITY): Payer: Self-pay | Admitting: Pharmacy Technician

## 2012-01-14 ENCOUNTER — Encounter (HOSPITAL_COMMUNITY)
Admission: RE | Disposition: A | Discharge status: Home / Self Care | Payer: Self-pay | Source: Ambulatory Visit | Attending: Internal Medicine

## 2012-01-14 ENCOUNTER — Observation Stay (HOSPITAL_COMMUNITY)
Admission: RE | Admit: 2012-01-14 | Discharge: 2012-01-14 | Disposition: A | Discharge status: Home / Self Care | Payer: Medicare Other | Source: Ambulatory Visit | Attending: Internal Medicine | Admitting: Internal Medicine

## 2012-01-14 ENCOUNTER — Encounter (HOSPITAL_COMMUNITY): Payer: Self-pay | Admitting: *Deleted

## 2012-01-14 ENCOUNTER — Observation Stay (HOSPITAL_COMMUNITY)
Admission: AD | Admit: 2012-01-14 | Discharge: 2012-01-15 | Disposition: A | Discharge status: Home / Self Care | Payer: Medicare Other | Source: Ambulatory Visit | Attending: Internal Medicine | Admitting: Internal Medicine

## 2012-01-14 DIAGNOSIS — K5730 Diverticulosis of large intestine without perforation or abscess without bleeding: Secondary | ICD-10-CM

## 2012-01-14 DIAGNOSIS — K648 Other hemorrhoids: Secondary | ICD-10-CM | POA: Insufficient documentation

## 2012-01-14 DIAGNOSIS — Z860100 Personal history of colon polyps, unspecified: Secondary | ICD-10-CM | POA: Insufficient documentation

## 2012-01-14 DIAGNOSIS — J4489 Other specified chronic obstructive pulmonary disease: Secondary | ICD-10-CM | POA: Insufficient documentation

## 2012-01-14 DIAGNOSIS — E119 Type 2 diabetes mellitus without complications: Secondary | ICD-10-CM | POA: Insufficient documentation

## 2012-01-14 DIAGNOSIS — Z79899 Other long term (current) drug therapy: Secondary | ICD-10-CM | POA: Insufficient documentation

## 2012-01-14 DIAGNOSIS — Z951 Presence of aortocoronary bypass graft: Secondary | ICD-10-CM | POA: Insufficient documentation

## 2012-01-14 DIAGNOSIS — Z7982 Long term (current) use of aspirin: Secondary | ICD-10-CM | POA: Insufficient documentation

## 2012-01-14 DIAGNOSIS — I4891 Unspecified atrial fibrillation: Secondary | ICD-10-CM | POA: Insufficient documentation

## 2012-01-14 DIAGNOSIS — Z01812 Encounter for preprocedural laboratory examination: Secondary | ICD-10-CM | POA: Insufficient documentation

## 2012-01-14 DIAGNOSIS — J449 Chronic obstructive pulmonary disease, unspecified: Secondary | ICD-10-CM | POA: Insufficient documentation

## 2012-01-14 DIAGNOSIS — I10 Essential (primary) hypertension: Secondary | ICD-10-CM | POA: Insufficient documentation

## 2012-01-14 DIAGNOSIS — D126 Benign neoplasm of colon, unspecified: Secondary | ICD-10-CM | POA: Insufficient documentation

## 2012-01-14 DIAGNOSIS — K644 Residual hemorrhoidal skin tags: Secondary | ICD-10-CM | POA: Insufficient documentation

## 2012-01-14 DIAGNOSIS — D649 Anemia, unspecified: Secondary | ICD-10-CM

## 2012-01-14 DIAGNOSIS — E785 Hyperlipidemia, unspecified: Secondary | ICD-10-CM | POA: Insufficient documentation

## 2012-01-14 DIAGNOSIS — D509 Iron deficiency anemia, unspecified: Principal | ICD-10-CM | POA: Insufficient documentation

## 2012-01-14 DIAGNOSIS — Z7901 Long term (current) use of anticoagulants: Secondary | ICD-10-CM | POA: Insufficient documentation

## 2012-01-14 HISTORY — PX: COLONOSCOPY: SHX5424

## 2012-01-14 LAB — PREPARE RBC (CROSSMATCH)

## 2012-01-14 LAB — HEMOGLOBIN AND HEMATOCRIT, BLOOD: HCT: 27.5 % — ABNORMAL LOW (ref 39.0–52.0)

## 2012-01-14 LAB — GLUCOSE, CAPILLARY: Glucose-Capillary: 139 mg/dL — ABNORMAL HIGH (ref 70–99)

## 2012-01-14 SURGERY — COLONOSCOPY
Anesthesia: Moderate Sedation

## 2012-01-14 MED ORDER — PREGABALIN 50 MG PO CAPS
50.0000 mg | ORAL_CAPSULE | Freq: Every day | ORAL | Status: DC
  Filled 2012-01-14: qty 1

## 2012-01-14 MED ORDER — METFORMIN HCL 500 MG PO TABS
500.0000 mg | ORAL_TABLET | Freq: Two times a day (BID) | ORAL | Status: DC
  Administered 2012-01-14 – 2012-01-15 (×2): 500 mg via ORAL
  Filled 2012-01-14 (×2): qty 1

## 2012-01-14 MED ORDER — HYDROCODONE-ACETAMINOPHEN 5-325 MG PO TABS: 1.0000 | ORAL_TABLET | Freq: Four times a day (QID) | ORAL | Status: DC | PRN

## 2012-01-14 MED ORDER — FUROSEMIDE 10 MG/ML IJ SOLN
20.0000 mg | Freq: Once | INTRAMUSCULAR | Status: AC
  Administered 2012-01-14: 20 mg via INTRAVENOUS
  Filled 2012-01-14 (×2): qty 2

## 2012-01-14 MED ORDER — SILODOSIN 8 MG PO CAPS
8.0000 mg | ORAL_CAPSULE | Freq: Every day | ORAL | Status: DC
  Administered 2012-01-15: 8 mg via ORAL
  Filled 2012-01-14 (×3): qty 1

## 2012-01-14 MED ORDER — DIPHENHYDRAMINE HCL 25 MG PO CAPS
25.0000 mg | ORAL_CAPSULE | Freq: Once | ORAL | Status: AC
  Administered 2012-01-14: 25 mg via ORAL
  Filled 2012-01-14: qty 1

## 2012-01-14 MED ORDER — MIDAZOLAM HCL 5 MG/5ML IJ SOLN
INTRAMUSCULAR | Status: DC | PRN
  Administered 2012-01-14 (×2): 1 mg via INTRAVENOUS
  Administered 2012-01-14: 2 mg via INTRAVENOUS
  Administered 2012-01-14 (×2): 1 mg via INTRAVENOUS

## 2012-01-14 MED ORDER — MEPERIDINE HCL 50 MG/ML IJ SOLN
INTRAMUSCULAR | Status: AC
  Filled 2012-01-14: qty 1

## 2012-01-14 MED ORDER — SODIUM CHLORIDE 0.45 % IV SOLN
Freq: Once | INTRAVENOUS | Status: AC
  Administered 2012-01-14: 20 mL via INTRAVENOUS

## 2012-01-14 MED ORDER — AMLODIPINE BESYLATE 5 MG PO TABS: 5.0000 mg | ORAL_TABLET | Freq: Every day | ORAL | Status: DC

## 2012-01-14 MED ORDER — MEPERIDINE HCL 50 MG/ML IJ SOLN
INTRAMUSCULAR | Status: DC | PRN
  Administered 2012-01-14 (×2): 25 mg via INTRAVENOUS

## 2012-01-14 MED ORDER — CHLORHEXIDINE GLUCONATE CLOTH 2 % EX PADS
6.0000 | MEDICATED_PAD | Freq: Every day | CUTANEOUS | Status: DC
  Administered 2012-01-15: 6 via TOPICAL

## 2012-01-14 MED ORDER — MUPIROCIN 2 % EX OINT
1.0000 "application " | TOPICAL_OINTMENT | Freq: Two times a day (BID) | CUTANEOUS | Status: DC
  Administered 2012-01-14: 1 via NASAL
  Filled 2012-01-14: qty 22

## 2012-01-14 MED ORDER — MIDAZOLAM HCL 5 MG/5ML IJ SOLN
INTRAMUSCULAR | Status: AC
  Filled 2012-01-14: qty 10

## 2012-01-14 MED ORDER — GLUCAGON HCL (RDNA) 1 MG IJ SOLR
INTRAMUSCULAR | Status: DC | PRN
  Administered 2012-01-14: .5 mg via INTRAVENOUS

## 2012-01-14 MED ORDER — ACETAMINOPHEN 325 MG PO TABS
650.0000 mg | ORAL_TABLET | Freq: Once | ORAL | Status: AC
  Administered 2012-01-14: 650 mg via ORAL
  Filled 2012-01-14: qty 2

## 2012-01-14 MED ORDER — AMIODARONE HCL 200 MG PO TABS: 100.0000 mg | ORAL_TABLET | Freq: Every day | ORAL | Status: DC

## 2012-01-14 MED ORDER — NITROGLYCERIN 0.4 MG SL SUBL: 0.4000 mg | SUBLINGUAL_TABLET | SUBLINGUAL | Status: DC | PRN

## 2012-01-14 MED ORDER — SODIUM CHLORIDE 0.9 % IJ SOLN
INTRAMUSCULAR | Status: DC | PRN
  Administered 2012-01-14 (×2): 10 mL

## 2012-01-14 MED ORDER — METOPROLOL TARTRATE 25 MG PO TABS
25.0000 mg | ORAL_TABLET | Freq: Two times a day (BID) | ORAL | Status: DC
  Administered 2012-01-14: 25 mg via ORAL
  Filled 2012-01-14: qty 1

## 2012-01-14 MED ORDER — STERILE WATER FOR IRRIGATION IR SOLN
Status: DC | PRN
  Administered 2012-01-14: 10:00:00

## 2012-01-14 MED ORDER — PANTOPRAZOLE SODIUM 40 MG PO TBEC
40.0000 mg | DELAYED_RELEASE_TABLET | Freq: Every day | ORAL | Status: DC
  Administered 2012-01-14: 40 mg via ORAL
  Filled 2012-01-14: qty 1

## 2012-01-14 MED ORDER — SODIUM CHLORIDE 0.9 % IV SOLN: INTRAVENOUS | Status: DC

## 2012-01-14 MED ORDER — DIAZEPAM 5 MG PO TABS
5.0000 mg | ORAL_TABLET | Freq: Four times a day (QID) | ORAL | Status: DC | PRN
  Administered 2012-01-14: 5 mg via ORAL
  Filled 2012-01-14: qty 1

## 2012-01-14 MED ORDER — SODIUM CHLORIDE 0.9 % IJ SOLN
INTRAMUSCULAR | Status: AC
  Filled 2012-01-14: qty 3

## 2012-01-14 NOTE — Brief Op Note (Signed)
01/14/2012  11:50 AM  PATIENT:  [name]  78 y.o. male  PRE-OPERATIVE DIAGNOSIS:  anemia  POST-OPERATIVE DIAGNOSIS:  diverticulosis; Multiple polyps; internal and external hemorrhoids  PROCEDURE:  Procedure(s) (LRB): COLONOSCOPY (N/A)  SURGEON:  Surgeon(s) and Role:    * Malissa Hippo, MD - Primary   Brief procedure note. Complex cecal polyp snared piecemeal following saline injection. Post polypectomy bleed. 2 hemoclips applied to bleeding site. 2 small polyp snared from ascending colon. 12 mm polyp snared in 2 pieces from transverse colon. 12 polyp snared from splenic flexure. Left-sided diverticulosis. Multiple small polyps remain be treated. Internal/external hemorrhoids

## 2012-01-14 NOTE — Op Note (Addendum)
[name], [name]             ACCOUNT NO.:  [id number]  MEDICAL RECORD NO.:  [id number]  LOCATION:                                FACILITY:  [facility]  PHYSICIAN:  Lionel December, M.D.    DATE OF BIRTH:  1931/12/29  DATE OF PROCEDURE:  01/14/2012 DATE OF DISCHARGE:  01/15/2012                              OPERATIVE REPORT   PROCEDURE:  Colonoscopy with snare polypectomy and hemoclip application to cecal polypectomy site.  INDICATION:  [name] is 78 year old Caucasian male who presented in January, GI bleed.  He had multiple colonic polyps, which were not removed because of INR around 2.  He has not had any more problems.  He is returning for therapeutic colonoscopy.  Procedures were reviewed with the patient.  Informed consent was obtained.  He has been off his warfarin for few days.  MEDS FOR CONSCIOUS SEDATION:  Demerol 50 mg IV, Versed 6 mg IV, Glucagon 0.5 mg IV.  FINDINGS:  Procedure performed in endoscopy suite.  The patient's vital signs and O2 sat were monitored during the procedure and remained stable.  The patient was placed in left lateral recumbent position and rectal examination performed.  No abnormality noted on external or digital exam.  Pentax videoscope was placed through rectum and advanced under vision into sigmoid colon and beyond.  Preparation was excellent. Moderate number of diverticula at sigmoid and descending colon.  Scope was passed to cecum which was identified by appendiceal orifice and ileocecal valve.  Between the ileocecal valve and appendiceal orifice, he had a complex lobulated polyp about which was over 3 cm in maximal length.  Polyp was elevated with saline injection and piecemeal polypectomy performed.  There was oozing from 2 sites.  Two hemoclips were applied.  There was difficult approach to this area.  Good hemostasis obtained.  There was another cecal polyp which was about 7-8 mm, but was not snared.  Two 6-7 mm polyp snared from  ascending colon and submitted together.  There was another 12-13 mm broad-based polyp at transverse colon which was snared in 2 pieces.  At least one piece was retrieved.  There was another 12 mm broad-based polyp at splenic flexure which was snared and retrieved for sludge examination.  A Roth Net was used for polyp retrieval.  There were few small scattered polyps throughout the colon.  Rectal mucosa was normal.  Scope was retroflexed to examine anorectal junction and hemorrhoids noted above and below the dentate line.  Endoscope was then withdrawn.  The patient tolerated the procedure well. Cecal withdrawal x47 minutes.  FINAL DIAGNOSES: 1. Complex large polyp at cecum.  Saline-assisted piecemeal     polypectomy performed.  Post polypectomy bleeding/oozing necessitated the     application of 2 hemoclips. 2. Two small polyps snared from ascending colon and submitted     together. 3. A 12-mm polyp snared piecemeal from transverse colon. 4. Another 12 mm polyp snared from splenic flexure. 5. Multiple small polyps remained to be treated. 6. Left-sided diverticulosis. 7. External/internal hemorrhoids.  RECOMMENDATIONS:  Will check patient's H&H today.  We will hold his aspirin for 10 days.  Low-residue diet for 24 hours.  He will not  be able to have MRI  until  documented to have passed clips.  I will be contacting the patient with results of biopsy and plan  to bring him back in 3-4 months to remove rest of the polyps.          ______________________________ Lionel December, M.D.     NR/MEDQ  D:  01/14/2012  T:  01/14/2012  Job:  [id number]  cc:   Dr. Juanetta Gosling

## 2012-01-14 NOTE — H&P (Signed)
Dictated;job [id number]

## 2012-01-14 NOTE — Discharge Instructions (Signed)
No aspirin for 10 days. Low-residue diet for 24-hours. No driving for 16-XWRUE. Physician will contact you with biopsy results  Colon Polyps A polyp is extra tissue that grows inside your body. Colon polyps grow in the large intestine. The large intestine, also called the colon, is part of your digestive system. It is a long, hollow tube at the end of your digestive tract where your body makes and stores stool. Most polyps are not dangerous. They are benign. This means they are not cancerous. But over time, some types of polyps can turn into cancer. Polyps that are smaller than a pea are usually not harmful. But larger polyps could someday become or may already be cancerous. To be safe, doctors remove all polyps and test them.  WHO GETS POLYPS? Anyone can get polyps, but certain people are more likely than others. You may have a greater chance of getting polyps if:  You are over 50.   You have had polyps before.   Someone in your family has had polyps.   Someone in your family has had cancer of the large intestine.   Find out if someone in your family has had polyps. You may also be more likely to get polyps if you:   Eat a lot of fatty foods.   Smoke.   Drink alcohol.   Do not exercise.   Eat too much.  SYMPTOMS  Most small polyps do not cause symptoms. People often do not know they have one until their caregiver finds it during a regular checkup or while testing them for something else. Some people do have symptoms like these:  Bleeding from the anus. You might notice blood on your underwear or on toilet paper after you have had a bowel movement.   Constipation or diarrhea that lasts more than a week.   Blood in the stool. Blood can make stool look black or it can show up as red streaks in the stool.  If you have any of these symptoms, see your caregiver. HOW DOES THE DOCTOR TEST FOR POLYPS? The doctor can use four tests to check for polyps:  Digital rectal exam. The  caregiver wears gloves and checks your rectum (the last part of the large intestine) to see if it feels normal. This test would find polyps only in the rectum. Your caregiver may need to do one of the other tests listed below to find polyps higher up in the intestine.   Barium enema. The caregiver puts a liquid called barium into your rectum before taking x-rays of your large intestine. Barium makes your intestine look white in the pictures. Polyps are dark, so they are easy to see.   Sigmoidoscopy. With this test, the caregiver can see inside your large intestine. A thin flexible tube is placed into your rectum. The device is called a sigmoidoscope, which has a light and a tiny video camera in it. The caregiver uses the sigmoidoscope to look at the last third of your large intestine.   Colonoscopy. This test is like sigmoidoscopy, but the caregiver looks at all of the large intestine. It usually requires sedation. This is the most common method for finding and removing polyps.  TREATMENT   The caregiver will remove the polyp during sigmoidoscopy or colonoscopy. The polyp is then tested for cancer.   If you have had polyps, your caregiver may want you to get tested regularly in the future.  PREVENTION  There is not one sure way to prevent polyps. You  might be able to lower your risk of getting them if you:  Eat more fruits and vegetables and less fatty food.   Do not smoke.   Avoid alcohol.   Exercise every day.   Lose weight if you are overweight.   Eating more calcium and folate can also lower your risk of getting polyps. Some foods that are rich in calcium are milk, cheese, and broccoli. Some foods that are rich in folate are chickpeas, kidney beans, and spinach.   Aspirin might help prevent polyps. Studies are under way.  Document Released: 06/09/2004 Document Revised: 09/02/2011 Document Reviewed: 11/15/2007 [facility] Patient Information 2012 [place], [place].

## 2012-01-14 NOTE — Plan of Care (Signed)
Problem: Phase I Progression Outcomes Goal: Pain controlled with appropriate interventions Outcome: Completed/Met Date Met:  01/14/12 No c/o pain Goal: Hemodynamically stable Outcome: Progressing 2 units PRBC administered  Problem: Phase III Progression Outcomes Goal: Pain controlled on oral analgesia Outcome: Not Applicable Date Met:  01/14/12 No c/o pain

## 2012-01-14 NOTE — H&P (Signed)
[name] is an 78 y.o. male.   Chief Complaint: Patient is here for colonoscopy with polypectomy. HPI: Patient is a 78 year old Caucasian male was admitted to this facility with GI bleed in January 2013. Had EGD and colonoscopy multiple polyps but is not removed because his INR was close to 2. He has not had any more episodes of melena or rectal bleeding. He is here for therapeutic colonoscopy. His warfarin is on hold.  Past Medical History  Diagnosis Date  . Hypertension   . COPD (chronic obstructive pulmonary disease)   . CAD (coronary artery disease)     s/p 4V CABG '96, Myoview '10 w/o ischemia  . Arthritis   . Atrial fibrillation     coumadin stopped 12/2011 2/2 anemia  . Dysphagia   . Anemia     requiring blood transfusions  . Carotid stenosis     Dr [name] follows.   . Diabetes mellitus, type 2     A1C 6.7% 12/2011  . HLD (hyperlipidemia)     LDL 65 12/2011, intolerant to statins  . Hematuria     felt 2/2 foley insertion  . LV dysfunction     EF 45-50% by echo 2007  . Melena     EGD & Colonoscopy 09/2011 revealed gastritis, erosions, scars, diverticula, 8 colon polyps (largest 1.6cm, not removed 2/2 anticoagulation), small AVM in transverse colon  . History of tobacco abuse     quit 1992    Past Surgical History  Procedure Date  . Knee surgery 1960    Right knee cartilage  . Rotator cuff repair 1990    right   . Coronary artery bypass graft 1996    4 by-pass  . Inguinal hernia repair 2000    left  . Cholecystectomy 05/2010    Lap chole with biologic mesh repair/reinforcement by  Dr Zachery Dakins  . Back surgery 02/2006    ruptured disk,  post op diskitis infection requiring prolonged hospital stay and  surgical I & D  . Tonsillectomy   . Esophagogastroduodenoscopy 10/08/2011    Procedure: ESOPHAGOGASTRODUODENOSCOPY (EGD);  Surgeon: Malissa Hippo, MD;  Location: [facility];  Service: Endoscopy;  Laterality: N/A;  . Colonoscopy 10/08/2011   Procedure: COLONOSCOPY;  Surgeon: Malissa Hippo, MD;  Location: [facility];  Service: Endoscopy;  Laterality: N/A;  . Cataract extraction, bilateral   . Peg placement 07/2006    placed due to prolonged infection, poor po intake, malnutrition during several week hospital stay resulting from ruptured disc that became infected following surgery    Family History  Problem Relation Age of Onset  . Heart disease Sister   . Heart disease Brother   . Coronary artery disease Mother   . Cancer Father     stomach  . Anesthesia problems Neg Hx   . Hypotension Neg Hx   . Malignant hyperthermia Neg Hx   . Pseudochol deficiency Neg Hx    Social History:  reports that he quit smoking about 21 years ago. His smoking use included Cigarettes. He has a 80 pack-year smoking history. He does not have any smokeless tobacco history on file. He reports that he does not drink alcohol or use illicit drugs.  Allergies:  Allergies  Allergen Reactions  . Statins Other (See Comments)    Causes legs to hurt.   . Tape     Causes skin to peel off.     Medications Prior to Admission  Medication Dose Route Frequency Provider  Last Rate Last Dose  . 0.45 % sodium chloride infusion   Intravenous Once Malissa Hippo, MD 20 mL/hr at 01/14/12 0855 20 mL at 01/14/12 0855  . meperidine (DEMEROL) 50 MG/ML injection           . midazolam (VERSED) 5 MG/5ML injection           . simethicone susp in sterile water 1000 mL irrigation    PRN Malissa Hippo, MD       Medications Prior to Admission  Medication Sig Dispense Refill  . acetaminophen (TYLENOL) 500 MG tablet Take 1,000 mg by mouth every 6 (six) hours as needed. Pain      . amiodarone (PACERONE) 200 MG tablet Take 0.5 tablets (100 mg total) by mouth daily.  30 tablet  5  . aspirin 81 MG tablet Take 81 mg by mouth daily.       . diazepam (VALIUM) 5 MG tablet Take 5 mg by mouth every 6 (six) hours as needed. Muscles Spasms      . HYDROcodone-acetaminophen  (NORCO) 5-325 MG per tablet Take 1 tablet by mouth every 6 (six) hours as needed. Pain      . metFORMIN (GLUCOPHAGE) 500 MG tablet Take 500 mg by mouth 2 (two) times daily with a meal.      . metoprolol tartrate (LOPRESSOR) 25 MG tablet Take 25 mg by mouth 2 (two) times daily.        . Multiple Vitamin (MULTIVITAMIN) tablet Take 1 tablet by mouth daily.        . nitroGLYCERIN (NITROSTAT) 0.4 MG SL tablet Place 1 tablet (0.4 mg total) under the tongue every 5 (five) minutes x 3 doses as needed for chest pain (up to 3 doses).  25 tablet  3  . pantoprazole (PROTONIX) 40 MG tablet Take 1 tablet (40 mg total) by mouth daily.  30 tablet  1  . pregabalin (LYRICA) 50 MG capsule Take 50 mg by mouth daily.       . Red Yeast Rice 600 MG TABS Take 1 tablet by mouth daily.        . silodosin (RAPAFLO) 8 MG CAPS capsule Take 8 mg by mouth daily with breakfast.          Results for orders placed during the hospital encounter of 01/14/12 (from the past 48 hour(s))  GLUCOSE, CAPILLARY     Status: Abnormal   Collection Time   01/14/12  8:44 AM      Component Value Range Comment   Glucose-Capillary 139 (*) 70 - 99 (mg/dL)    No results found.  Review of Systems  Constitutional: Negative for weight loss.  Gastrointestinal: Negative for abdominal pain, diarrhea, constipation, blood in stool and melena.    Blood pressure 146/73, pulse 70, temperature 98.1 F (36.7 C), temperature source Oral, resp. rate 20, SpO2 93.00%. Physical Exam  Constitutional: He appears well-developed and well-nourished.  HENT:  Mouth/Throat: Oropharynx is clear and moist.  Eyes: Conjunctivae are normal. No scleral icterus.  Neck: No thyromegaly present.  Cardiovascular: Normal rate, regular rhythm and normal heart sounds.   No murmur heard. Respiratory: Effort normal and breath sounds normal.  GI: He exhibits no distension and no mass. There is tenderness (mild tenderness in LLQ).  Musculoskeletal: He exhibits edema (1+ edema  around ankles).  Lymphadenopathy:    He has no cervical adenopathy.  Neurological: He is alert.  Skin: Skin is warm and dry.     Assessment/Plan History of  GI bleed. Colonic polyps. Colonoscopy with polypectomy.  [name] 01/14/2012, 10:19 AM

## 2012-01-15 LAB — TYPE AND SCREEN
ABO/RH(D): A NEG
Unit division: 0

## 2012-01-15 LAB — HEMOGLOBIN AND HEMATOCRIT, BLOOD: Hemoglobin: 11 g/dL — ABNORMAL LOW (ref 13.0–17.0)

## 2012-01-15 LAB — BASIC METABOLIC PANEL
BUN: 15 mg/dL (ref 6–23)
Chloride: 101 mEq/L (ref 96–112)
GFR calc Af Amer: 59 mL/min — ABNORMAL LOW (ref >90)
Glucose, Bld: 154 mg/dL — ABNORMAL HIGH (ref 70–99)
Potassium: 3.7 mEq/L (ref 3.5–5.1)
Sodium: 139 mEq/L (ref 135–145)

## 2012-01-15 MED ORDER — MUPIROCIN 2 % EX OINT: 1.0000 "application " | TOPICAL_OINTMENT | Freq: Two times a day (BID) | CUTANEOUS | Status: AC

## 2012-01-15 MED ORDER — ACETAMINOPHEN 500 MG PO TABS: 500.0000 mg | ORAL_TABLET | Freq: Four times a day (QID) | ORAL | Status: DC | PRN

## 2012-01-15 MED ORDER — CHLORHEXIDINE GLUCONATE CLOTH 2 % EX PADS: 6.0000 | MEDICATED_PAD | Freq: Every day | CUTANEOUS | Status: DC

## 2012-01-15 NOTE — Progress Notes (Signed)
D/c instructions reviewed with patient, wife, and niece.  Verbalized understanding. Pt dc'd to home with family.  Schonewitz, Candelaria Stagers 01/15/2012

## 2012-01-15 NOTE — Progress Notes (Signed)
Subjective; Patient has no complaints. He ate all his breakfast. He just had normal stool. He denies abdominal pain. Objective; BP 131/61  Pulse 71  Temp(Src) 98.3 F (36.8 C) (Oral)  Resp 20  Ht 5' 11.5" (1.816 m)  Wt 215 lb (97.523 kg)  BMI 29.57 kg/m2  SpO2 93% Patient does not appear to be pale anymore. Abdomen is full but soft and nontender without organomegaly or masses. No LE edema noted. Lab data; H&H 11 and 34.1. Electrolytes are normal. Glucose 154, BUN 15, creatinine 1.31. Assessment; Recurrent anemia secondary to GI blood. Hemoglobin up from 8 g to 11 g after 2 units of PRBCs were given. No evidence of post polypectomy bleed. Plan; Patient will go home. Given capsule study as planned on 01/18/2012. I will contact patient with results of biopsy next week. Will check his H&H in 2 weeks. Patient advised not to take Pylera for now.

## 2012-01-15 NOTE — H&P (Signed)
NAMECAROLINE, MATTERS             ACCOUNT NO.:  [id number]  MEDICAL RECORD NO.:  [id number]  LOCATION:  A335                          FACILITY:  [facility]  PHYSICIAN:  Lionel December, M.D.    DATE OF BIRTH:  1931/12/12  DATE OF ADMISSION:  01/14/2012 DATE OF DISCHARGE:  LH                             HISTORY & PHYSICAL   REASON FOR ADMISSION/OBSERVATION:  The patient has anemia with hemoglobin of 8 g and hematocrit of 26.1.  HISTORY OF PRESENT ILLNESS:  The patient is a 78 year old Caucasian male who has history of GI bleed and anemia.  He was admitted to this facility in January, and had 2-unit bleed.  He underwent EGD and a colonoscopy.  He had multiple colonic polyps.  These were not removed because of elevated INR.  He also had H. pylori serology and has not been treated, but no lesion was identified.  Plan was for him to return for elective colonoscopy.  In the meantime, he developed weakness and chest discomfort and was admitted to [facility] between January 03, 2012 and January 06, 2012.  Once again, the patient's H and H were low and he was given 2 units of PRBCs.  His troponin levels were mildly elevated.  It was felt he did not have acute myocardial infarct, and his angina/ischemia was felt to be secondary to anemia.  The patient was seen in the office earlier this week and scheduled for elective colonoscopy.  He denied melena or rectal bleeding.  His Coumadin had been discontinued during his recent hospitalization, and he was asked to stay on aspirin.  The patient underwent colonoscopy with removal of a large polyp from cecum along with 4 other polyps.  He still has multiple small polyps. Since the patient was feeling somewhat weak, we decided to check his hemoglobin and was 8 g.  He was therefore admitted as an observation for transfusion and to be monitored.  The patient is already scheduled for Given capsule study next week.  HOME MEDICATIONS: 1. Acetaminophen 1 g p.o. q.6 h  p.r.n. 2. Amiodarone 100 mg p.o. daily. 3. Amlodipine 5 mg p.o. daily. 4. Diazepam 5 mg q.6 h p.r.n. 5. Norco 5/325 one tablet p.o. q.6 p.r.n. 6. Metformin 500 mg p.o. b.i.d. 7. Metoprolol 25 mg p.o. b.i.d. 8. MVI one daily. 9. Nitroglycerin 0.4 mg sublingual p.r.n. 10.Pantoprazole 40 mg p.o. q.a.m. 11.Pregabalin 50 mg p.o. daily. 12.Red yeast rice 60 mg p.o. daily. 13.Silodosin 8 mg p.o. daily with breakfast.  PAST MEDICAL HISTORY: 1. History of GI bleed and anemia as above. 2. History of colonic polyps.  He did not return for colonoscopy as     recommended and has been possibly 5 years late. 3. Coronary artery disease with 4-vessel CABG in 1996. 4. Diabetes mellitus. 5. Atrial fibrillation. 6. Hypertension. 7. Hyperlipidemia. 8. COPD. 9. Osteoarthrosis.  PAST SURGICAL HISTORY:  Prior surgeries include: 1. Right knee arthrotomy in 1960. 2. Repair of right shoulder cuff rotator tear in 1990. 3. Left inguinal herniorrhaphy in 2000. 4. Lumbar spine surgery for disk prolapse in 2007. 5. He has had tonsillectomy as a child. 6. Cholecystectomy in November 2011. Social and family history; please refer to note  from 01/10/2012.  OBJECTIVE:  VITAL SIGNS:  Admission weight is 215 pounds.  He is 5 feet 11-1/2 inches tall.  Pulse 68 per minute and regular, blood pressure 144/65, respiratory rate is 20, and temp is 97.7. HEENT:  Conjunctivae are pale.  Sclerae are nonicteric.  Oropharyngeal mucosa is normal. NECK:  No neck masses or thyromegaly noted. CARDIAC:  Regular rhythm.  Normal S1 and S2.  No murmur or gallop noted. LUNGS:  Clear to auscultation. ABDOMEN:  Protuberant, but soft and nontender without organomegaly or masses. EXTREMITIES:  No peripheral edema or clubbing noted.  ASSESSMENT: Recurrent anemia.  Patient's hemoglobin has dropped again to 8 g.  His hemoglobin about 8 days ago was 9.5 g.  He has not had any overt bleed, but he appears to be symptomatic and  therefore needs to be given PRBCs.  His evaluation is still not complete.  He had colonoscopy with removal of multiple polyps today, but he still has small polyps and need to be removed later date.  He is also scheduled for Given capsule study next week.  PLAN:  He will be admitted as an observation.  We will type and crossmatch for 2 units and we will give him 2 units of PRBCs.  We will check post transfusion H and H, and if he does well, he will be going home either later tonight or in a.m.  H. pylori gastritis to be treated at a later date.                                          ______________________________ Lionel December, M.D.    NR/MEDQ  D:  01/14/2012  T:  01/15/2012  Job:  [id number]  cc:   Dr. Juanetta Gosling

## 2012-01-17 NOTE — Discharge Summary (Signed)
Principal diagnosis; Recurrent anemia  secondary to GI bleed of obscure origin. Admission hemoglobin 8 g. Discharge hemoglobin 11 g after 2 units of PRBCs. Secondary diagnoses; Coronary artery disease. Atrial fibrillation. GERD. H. pylori gastritis Diabetes mellitus. Hypertension. Hyperlipidemia. Chronic low back pain. Osteoarthrosis. Colonic polyps. Condition at the time of discharge; improved. Hospital course. Patient is a 78 year old Caucasian male who was initially evaluated for acute GI bleed in January 2013. Had EGD and a colonoscopy. He was found to have H. pylori gastritis and multiple colonic polyps but none were bleeding. Polypectomy was deferred because of iron not of INR of 2. Patient was recently hospitalized at [facility] and had to be transfused again. Cause of her recurrent anemia his warfarin was discontinued. He came for an elective colonoscopy with polypectomy and BX 6 polyps removed along with complex polyp from the cecum. Patient's H&H was checked and was quite low. She was therefore admitted as an observation. He was given 2 units of PRBCs. Posttransfusion hemoglobin came up to 11 g. He is feeling much better. He did not have any problems post polypectomy. He had normal stool and the day of discharge. Patient is scheduled for a given capsule study on 01/18/2012. He'll be treated for H. pylori gastritis as soon as evaluation for anemia and GI bleed  completed. He still has multiple small polyps which will be removed on next colonoscopy. Discharge medications. No current facility-administered medications for this encounter.   Current Outpatient Prescriptions  Medication Sig Dispense Refill  . acetaminophen (TYLENOL) 500 MG tablet Take 1 tablet (500 mg total) by mouth every 6 (six) hours as needed. Pain  30 tablet    . amiodarone (PACERONE) 200 MG tablet Take 0.5 tablets (100 mg total) by mouth daily.  30 tablet  5  . amLODipine (NORVASC) 5 MG tablet Take 5 mg by mouth  daily.      . Chlorhexidine Gluconate Cloth 2 % PADS Apply 6 each topically daily at 6 (six) AM.  6 each  5  . diazepam (VALIUM) 5 MG tablet Take 5 mg by mouth every 6 (six) hours as needed. Muscles Spasms      . HYDROcodone-acetaminophen (NORCO) 5-325 MG per tablet Take 1 tablet by mouth every 6 (six) hours as needed. Pain      . metFORMIN (GLUCOPHAGE) 500 MG tablet Take 500 mg by mouth 2 (two) times daily with a meal.      . metoprolol tartrate (LOPRESSOR) 25 MG tablet Take 25 mg by mouth 2 (two) times daily.        . Multiple Vitamin (MULTIVITAMIN) tablet Take 1 tablet by mouth daily.        . mupirocin ointment (BACTROBAN) 2 % Apply 1 application topically 2 (two) times daily.  22 g  0  . nitroGLYCERIN (NITROSTAT) 0.4 MG SL tablet Place 1 tablet (0.4 mg total) under the tongue every 5 (five) minutes x 3 doses as needed for chest pain (up to 3 doses).  25 tablet  3  . pantoprazole (PROTONIX) 40 MG tablet Take 1 tablet (40 mg total) by mouth daily.  30 tablet  1  . pregabalin (LYRICA) 50 MG capsule Take 50 mg by mouth daily.       . Red Yeast Rice 600 MG TABS Take 1 tablet by mouth daily.        . silodosin (RAPAFLO) 8 MG CAPS capsule Take 8 mg by mouth daily with breakfast.

## 2012-01-18 ENCOUNTER — Ambulatory Visit (HOSPITAL_COMMUNITY): Admission: RE | Admit: 2012-01-18 | Payer: Medicare Other | Source: Ambulatory Visit | Admitting: Internal Medicine

## 2012-01-18 ENCOUNTER — Encounter (INDEPENDENT_AMBULATORY_CARE_PROVIDER_SITE_OTHER): Payer: Self-pay | Admitting: *Deleted

## 2012-01-18 ENCOUNTER — Encounter (HOSPITAL_COMMUNITY): Admission: RE | Payer: Self-pay | Source: Ambulatory Visit

## 2012-01-18 SURGERY — IMAGING PROCEDURE, GI TRACT, INTRALUMINAL, VIA CAPSULE

## 2012-01-24 ENCOUNTER — Encounter (HOSPITAL_COMMUNITY): Payer: Self-pay | Admitting: Internal Medicine

## 2012-01-27 ENCOUNTER — Ambulatory Visit (INDEPENDENT_AMBULATORY_CARE_PROVIDER_SITE_OTHER): Payer: Medicare Other | Admitting: Internal Medicine

## 2012-01-27 ENCOUNTER — Encounter: Payer: Self-pay | Admitting: Internal Medicine

## 2012-01-27 VITALS — BP 142/62 | HR 64 | Ht 71.0 in | Wt 211.0 lb

## 2012-01-27 DIAGNOSIS — I4891 Unspecified atrial fibrillation: Secondary | ICD-10-CM

## 2012-01-27 LAB — HEPATIC FUNCTION PANEL
ALT: 20 U/L (ref 0–53)
Total Bilirubin: 1.2 mg/dL (ref 0.3–1.2)
Total Protein: 7 g/dL (ref 6.0–8.3)

## 2012-01-27 LAB — CBC WITH DIFFERENTIAL/PLATELET
Basophils Relative: 4.5 % — ABNORMAL HIGH (ref 0.0–3.0)
Eosinophils Absolute: 0.4 10*3/uL (ref 0.0–0.7)
MCHC: 32.4 g/dL (ref 30.0–36.0)
MCV: 84.2 fl (ref 78.0–100.0)
Monocytes Absolute: 0.4 10*3/uL (ref 0.1–1.0)
Neutrophils Relative %: 59 % (ref 43.0–77.0)
RBC: 4.23 Mil/uL (ref 4.22–5.81)
RDW: 19.8 % — ABNORMAL HIGH (ref 11.5–14.6)

## 2012-01-27 NOTE — Patient Instructions (Signed)
Lab work today We will call you with results. 

## 2012-01-27 NOTE — Progress Notes (Addendum)
HPI [name] is a 78 year old with a history of CAD (s/p CABG in 1996; myoview 2010 (no ischemia); CV disease (moderate), hypertension, dyslipidemia and atrial fibrillation. He also has a hx of DJD. I saw him in clinic in January 2012.  Lipid panel in April 2013 LDL was 65, HDL was 34, Trig 174. Patient recently admitted with GI bleed.  Transfused. Gastritis noted as well as polyps  None were bleeding however. Allergies  Allergen Reactions  . Statins Other (See Comments)    Causes legs to hurt.   . Tape     Causes skin to peel off.     Current Outpatient Prescriptions  Medication Sig Dispense Refill  . acetaminophen (TYLENOL) 500 MG tablet Take 1 tablet (500 mg total) by mouth every 6 (six) hours as needed. Pain  30 tablet    . amiodarone (PACERONE) 200 MG tablet Take 0.5 tablets (100 mg total) by mouth daily.  30 tablet  5  . amLODipine (NORVASC) 5 MG tablet Take 5 mg by mouth daily.      . Chlorhexidine Gluconate Cloth 2 % PADS Apply 6 each topically daily at 6 (six) AM.  6 each  5  . diazepam (VALIUM) 5 MG tablet Take 5 mg by mouth every 6 (six) hours as needed. Muscles Spasms      . HYDROcodone-acetaminophen (NORCO) 5-325 MG per tablet Take 1 tablet by mouth every 6 (six) hours as needed. Pain      . metFORMIN (GLUCOPHAGE) 500 MG tablet Take 500 mg by mouth 2 (two) times daily with a meal.      . metoprolol tartrate (LOPRESSOR) 25 MG tablet Take 25 mg by mouth 2 (two) times daily.        . Multiple Vitamin (MULTIVITAMIN) tablet Take 1 tablet by mouth daily.        . nitroGLYCERIN (NITROSTAT) 0.4 MG SL tablet Place 1 tablet (0.4 mg total) under the tongue every 5 (five) minutes x 3 doses as needed for chest pain (up to 3 doses).  25 tablet  3  . pantoprazole (PROTONIX) 40 MG tablet Take 1 tablet (40 mg total) by mouth daily.  30 tablet  1  . pregabalin (LYRICA) 50 MG capsule Take 50 mg by mouth daily.       . Red Yeast Rice 600 MG TABS Take 1 tablet by mouth daily.        .  silodosin (RAPAFLO) 8 MG CAPS capsule Take 8 mg by mouth daily with breakfast.          Past Medical History  Diagnosis Date  . Hypertension   . COPD (chronic obstructive pulmonary disease)   . CAD (coronary artery disease)     s/p 4V CABG '96, Myoview '10 w/o ischemia  . Arthritis   . Atrial fibrillation     coumadin stopped 12/2011 2/2 anemia  . Dysphagia   . Anemia     requiring blood transfusions  . Carotid stenosis     Dr Waverly Ferrari follows.   . Diabetes mellitus, type 2     A1C 6.7% 12/2011  . HLD (hyperlipidemia)     LDL 65 12/2011, intolerant to statins  . Hematuria     felt 2/2 foley insertion  . LV dysfunction     EF 45-50% by echo 2007  . Melena     EGD & Colonoscopy 09/2011 revealed gastritis, erosions, scars, diverticula, 8 colon polyps (largest 1.6cm, not removed 2/2 anticoagulation), small AVM in  transverse colon  . History of tobacco abuse     quit 1992    Past Surgical History  Procedure Date  . Knee surgery 1960    Right knee cartilage  . Rotator cuff repair 1990    right   . Coronary artery bypass graft 1996    4 by-pass  . Inguinal hernia repair 2000    left  . Cholecystectomy 05/2010    Lap chole with biologic mesh repair/reinforcement by  Dr Zachery Dakins  . Back surgery 02/2006    ruptured disk,  post op diskitis infection requiring prolonged hospital stay and  surgical I & D  . Tonsillectomy   . Esophagogastroduodenoscopy 10/08/2011    Procedure: ESOPHAGOGASTRODUODENOSCOPY (EGD);  Surgeon: Malissa Hippo, MD;  Location: [facility];  Service: Endoscopy;  Laterality: N/A;  . Colonoscopy 10/08/2011    Procedure: COLONOSCOPY;  Surgeon: Malissa Hippo, MD;  Location: [facility];  Service: Endoscopy;  Laterality: N/A;  . Cataract extraction, bilateral   . Peg placement 07/2006    placed due to prolonged infection, poor po intake, malnutrition during several week hospital stay resulting from ruptured disc that became infected following surgery   . Colonoscopy 01/14/2012    Procedure: COLONOSCOPY;  Surgeon: Malissa Hippo, MD;  Location: [facility];  Service: Endoscopy;  Laterality: N/A;  945    Family History  Problem Relation Age of Onset  . Heart disease Sister   . Heart disease Brother   . Coronary artery disease Mother   . Cancer Father     stomach  . Anesthesia problems Neg Hx   . Hypotension Neg Hx   . Malignant hyperthermia Neg Hx   . Pseudochol deficiency Neg Hx     History   Social History  . Marital Status: Married    Spouse Name: N/A    Number of Children: N/A  . Years of Education: N/A   Occupational History  . Not on file.   Social History Main Topics  . Smoking status: Former Smoker -- 2.0 packs/day for 40 years    Types: Cigarettes    Quit date: 09/27/1990  . Smokeless tobacco: Not on file  . Alcohol Use: No  . Drug Use: No  . Sexually Active: Not Currently   Other Topics Concern  . Not on file   Social History Narrative  . No narrative on file    Review of Systems:  All systems reviewed.  They are negative to the above problem except as previously stated.  Vital Signs: There were no vitals taken for this visit.  Physical Exam  HEENT:  Normocephalic, atraumatic. EOMI, PERRLA.  Neck: JVP is normal. No thyromegaly. No bruits.  Lungs: clear to auscultation. No rales no wheezes.  Heart: Regular rate and rhythm. Normal S1, S2. No S3.   No significant murmurs. PMI not displaced.  Abdomen:  Supple, nontender. Normal bowel sounds. No masses. No hepatomegaly.  Extremities:   Good distal pulses throughout. No lower extremity edema.  Musculoskeletal :moving all extremities.  Neuro:   alert and oriented x3.  CN II-XII grossly intact.  EKG:  SR  64 bpm.  Assessment and Plan:  1.  Anemia.  Recently discharged from hospital after transfusion.  Through this there was no evidence of cardiac injury.  Did have some volume problems that were treated with lasix.  Endoscopy as noted.   Overall,  I think patient at present is too high risk for coumadin.  I would keep  on ASA only when OK by GI Will repeat labs.  2.  Afib.  On amiodarone.  IN SR>  I do not think he is ia coumadin candidate with recent GI bleed.  ASA when OK with GI  3.  CHF.  Exacerbation prob due to transfusion.  Volume improved.  WIll check labs.  4.  CAD>  Remote CABG  Recent anemia represents a stress test for the patient.  No evidence of signif ischemia or injury.  5.  Dyslipidemia  Good control on red yeast rice.  6.  Hematuria  Had in hosp.  Possibly due to foley  Will repeat UA.

## 2012-01-28 LAB — URINALYSIS
Leukocytes, UA: NEGATIVE
Nitrite: NEGATIVE
Specific Gravity, Urine: 1.02 (ref 1.000–1.030)
pH: 6 (ref 5.0–8.0)

## 2012-01-28 LAB — BASIC METABOLIC PANEL
GFR: 71.81 mL/min (ref >60.00)
Glucose, Bld: 155 mg/dL — ABNORMAL HIGH (ref 70–99)
Potassium: 3.8 mEq/L (ref 3.5–5.1)
Sodium: 140 mEq/L (ref 135–145)

## 2012-02-02 ENCOUNTER — Telehealth (INDEPENDENT_AMBULATORY_CARE_PROVIDER_SITE_OTHER): Payer: Self-pay | Admitting: *Deleted

## 2012-02-02 NOTE — Telephone Encounter (Signed)
Patient's wife call and scheduled an apt. The first available was scheduled for 02/28/12 at 9:45 am with Dr. Karilyn Cota. Delores would like to know if this apt is okay with it being 6 weeks out? Chancelor had blood work at the cardiologist's office on 01/27/12 and his hemoglobin was 11.6.

## 2012-02-09 ENCOUNTER — Telehealth (INDEPENDENT_AMBULATORY_CARE_PROVIDER_SITE_OTHER): Payer: Self-pay | Admitting: *Deleted

## 2012-02-09 DIAGNOSIS — D649 Anemia, unspecified: Secondary | ICD-10-CM

## 2012-02-09 NOTE — Telephone Encounter (Signed)
Yes 6 weeks is okay but he needs another H/H in 2 weeks.

## 2012-02-09 NOTE — Progress Notes (Signed)
UR chart review completed. [name]  

## 2012-02-09 NOTE — Telephone Encounter (Signed)
Per NUR the patient will need to have H/H in 2 weeks.

## 2012-02-09 NOTE — Telephone Encounter (Signed)
Patient called and made aware that it was okay to wait until 6 weeks out and that he would need to go have lab work at [facility] on May 29 th. Order has been faxed

## 2012-02-17 ENCOUNTER — Other Ambulatory Visit: Payer: Self-pay | Admitting: Internal Medicine

## 2012-02-17 NOTE — Telephone Encounter (Signed)
.   Requested Prescriptions   Signed Prescriptions Disp Refills  . amLODipine (NORVASC) 5 MG tablet 90 tablet 4    Sig: TAKE ONE TABLET BY MOUTH EVERY DAY FOR BLOOD PRESSURE    Authorizing Provider: Pricilla Riffle    Ordering User: Lacie Scotts

## 2012-02-23 LAB — HEMOGLOBIN AND HEMATOCRIT, BLOOD
HCT: 33.8 % — ABNORMAL LOW (ref 39.0–52.0)
Hemoglobin: 11 g/dL — ABNORMAL LOW (ref 13.0–17.0)

## 2012-02-28 ENCOUNTER — Other Ambulatory Visit (INDEPENDENT_AMBULATORY_CARE_PROVIDER_SITE_OTHER): Payer: Self-pay | Admitting: *Deleted

## 2012-02-28 ENCOUNTER — Ambulatory Visit: Payer: Self-pay | Admitting: *Deleted

## 2012-02-28 ENCOUNTER — Ambulatory Visit (INDEPENDENT_AMBULATORY_CARE_PROVIDER_SITE_OTHER): Payer: Medicare Other | Admitting: Internal Medicine

## 2012-02-28 ENCOUNTER — Encounter (INDEPENDENT_AMBULATORY_CARE_PROVIDER_SITE_OTHER): Payer: Self-pay | Admitting: Internal Medicine

## 2012-02-28 ENCOUNTER — Encounter (INDEPENDENT_AMBULATORY_CARE_PROVIDER_SITE_OTHER): Payer: Self-pay | Admitting: *Deleted

## 2012-02-28 VITALS — BP 130/70 | HR 68 | Temp 97.0°F | Resp 20 | Ht 71.0 in | Wt 211.6 lb

## 2012-02-28 DIAGNOSIS — Z860100 Personal history of colon polyps, unspecified: Secondary | ICD-10-CM

## 2012-02-28 DIAGNOSIS — D509 Iron deficiency anemia, unspecified: Secondary | ICD-10-CM

## 2012-02-28 DIAGNOSIS — Z7901 Long term (current) use of anticoagulants: Secondary | ICD-10-CM

## 2012-02-28 DIAGNOSIS — I4891 Unspecified atrial fibrillation: Secondary | ICD-10-CM

## 2012-02-28 NOTE — Progress Notes (Signed)
Presenting complaint;  Followup for anemia and GI bleed. Patient also has history of colonic polyps all of which have not been removed. Subjective:  Patient is 78 year old Caucasian male who is here for scheduled visit accompanied by his wife. He had colonoscopy on 01/14/2012 with removal of  multiple polyps all of which were adenomas; two were large. His hemoglobin was low. He was kept overnight and transfused  2 units of PRBCs for hemoglobin of 8 g. He has no complaints. He does not get tired or short of breath on day to day activity or walking like he was experiencing before. He denies melena or rectal bleeding. At times he has noted his stools to be green in color. He has urgency with certain foods. He was able to finish Pylera for H. pylori gastritis. He remains with good appetite. He denies nausea vomiting or heartburn. His warfarin was discontinued by Dr. Tenny Craw about 2 months ago. He is presently on low-dose aspirin.  Current Medications: Current Outpatient Prescriptions  Medication Sig Dispense Refill  . acetaminophen (TYLENOL) 500 MG tablet Take 1 tablet (500 mg total) by mouth every 6 (six) hours as needed. Pain  30 tablet    . amiodarone (PACERONE) 200 MG tablet Take 0.5 tablets (100 mg total) by mouth daily.  30 tablet  5  . amLODipine (NORVASC) 5 MG tablet TAKE ONE TABLET BY MOUTH EVERY DAY FOR BLOOD PRESSURE  90 tablet  4  . aspirin 81 MG tablet Take 81 mg by mouth daily.      . diazepam (VALIUM) 5 MG tablet Take 5 mg by mouth every 6 (six) hours as needed. Muscles Spasms      . HYDROcodone-acetaminophen (NORCO) 5-325 MG per tablet Take 1 tablet by mouth every 6 (six) hours as needed. Pain      . metFORMIN (GLUCOPHAGE) 500 MG tablet Take 500 mg by mouth 2 (two) times daily with a meal.      . metoprolol tartrate (LOPRESSOR) 25 MG tablet Take 25 mg by mouth 2 (two) times daily.        . Multiple Vitamin (MULTIVITAMIN) tablet Take 1 tablet by mouth daily.        . nitroGLYCERIN  (NITROSTAT) 0.4 MG SL tablet Place 1 tablet (0.4 mg total) under the tongue every 5 (five) minutes x 3 doses as needed for chest pain (up to 3 doses).  25 tablet  3  . pregabalin (LYRICA) 75 MG capsule Take 75 mg by mouth once.      . Red Yeast Rice 600 MG TABS Take 1 tablet by mouth daily.        . silodosin (RAPAFLO) 8 MG CAPS capsule Take 8 mg by mouth daily with breakfast.        . DISCONTD: amLODipine (NORVASC) 5 MG tablet Take 5 mg by mouth daily.         Objective: Blood pressure 130/70, pulse 68, temperature 97 F (36.1 C), temperature source Temporal, resp. rate 20, height 5\' 11"  (1.803 m), weight 211 lb 9.6 oz (95.981 kg). Conjunctiva is pink. Sclera is nonicteric Oropharyngeal mucosa is normal. No neck masses or thyromegaly noted. Cardiac exam with regular rhythm normal S1 and S2. No murmur or gallop noted. Lungs are clear to auscultation. Abdomen is full; abdomen is soft with mild tenderness at LLQ without guarding or rebound. No organomegaly or masses noted. Rectal examination reveals formed stool in the vault and stool is guaiac negative. No LE edema or clubbing noted.  Labs/studies  Results: H&H from 02/23/2012 is 11 and 33.8.   Assessment:  #1. Iron deficiency anemia secondary to chronic GI blood loss. He has received multiple transfusions in the last 6 months and most recently 2 units on 01/14/2012. Hemoglobin has remained 11 or higher in the last 6 weeks and his stool is guaiac-negative today. It is possible that he may have been losing blood from one of these polyps. Since his stool is guaiac negative today we'll hold off given capsule study. #2. Colonic polyps. He has another 7 or 8 small polyps in his colon that need to be removed. #3. H. pylori gastritis status post therapy with Pylera.  Plan:  Patient can take OTC Imodium 2 mg when he goes out to eat. Colonoscopy in 6 weeks or so. Will check H&H prior to colonoscopy.

## 2012-02-28 NOTE — Telephone Encounter (Signed)
Error   This encounter was created in error - please disregard. 

## 2012-02-28 NOTE — Patient Instructions (Signed)
Colonoscopy to be scheduled in third or fourth week of July 2013. Will check hemoglobin prior to colonoscopy.

## 2012-04-05 ENCOUNTER — Encounter (INDEPENDENT_AMBULATORY_CARE_PROVIDER_SITE_OTHER): Payer: Self-pay | Admitting: *Deleted

## 2012-04-06 ENCOUNTER — Encounter (HOSPITAL_COMMUNITY): Payer: Self-pay | Admitting: Pharmacy Technician

## 2012-04-19 MED ORDER — SODIUM CHLORIDE 0.45 % IV SOLN
Freq: Once | INTRAVENOUS | Status: AC
  Administered 2012-04-20: 1000 mL via INTRAVENOUS

## 2012-04-20 ENCOUNTER — Encounter (HOSPITAL_COMMUNITY)
Admission: RE | Disposition: A | Discharge status: Home / Self Care | Payer: Self-pay | Source: Ambulatory Visit | Attending: Internal Medicine

## 2012-04-20 ENCOUNTER — Encounter (HOSPITAL_COMMUNITY): Payer: Self-pay | Admitting: *Deleted

## 2012-04-20 ENCOUNTER — Ambulatory Visit (HOSPITAL_COMMUNITY)
Admission: RE | Admit: 2012-04-20 | Discharge: 2012-04-20 | Disposition: A | Discharge status: Home / Self Care | Payer: Medicare Other | Source: Ambulatory Visit | Attending: Internal Medicine | Admitting: Internal Medicine

## 2012-04-20 DIAGNOSIS — I10 Essential (primary) hypertension: Secondary | ICD-10-CM | POA: Insufficient documentation

## 2012-04-20 DIAGNOSIS — Z860100 Personal history of colon polyps, unspecified: Secondary | ICD-10-CM | POA: Insufficient documentation

## 2012-04-20 DIAGNOSIS — D126 Benign neoplasm of colon, unspecified: Secondary | ICD-10-CM | POA: Insufficient documentation

## 2012-04-20 DIAGNOSIS — J4489 Other specified chronic obstructive pulmonary disease: Secondary | ICD-10-CM | POA: Insufficient documentation

## 2012-04-20 DIAGNOSIS — K5730 Diverticulosis of large intestine without perforation or abscess without bleeding: Secondary | ICD-10-CM | POA: Insufficient documentation

## 2012-04-20 DIAGNOSIS — D509 Iron deficiency anemia, unspecified: Secondary | ICD-10-CM

## 2012-04-20 DIAGNOSIS — Z01812 Encounter for preprocedural laboratory examination: Secondary | ICD-10-CM | POA: Insufficient documentation

## 2012-04-20 DIAGNOSIS — Z79899 Other long term (current) drug therapy: Secondary | ICD-10-CM | POA: Insufficient documentation

## 2012-04-20 DIAGNOSIS — J449 Chronic obstructive pulmonary disease, unspecified: Secondary | ICD-10-CM | POA: Insufficient documentation

## 2012-04-20 DIAGNOSIS — E785 Hyperlipidemia, unspecified: Secondary | ICD-10-CM | POA: Insufficient documentation

## 2012-04-20 HISTORY — DX: Gastro-esophageal reflux disease without esophagitis: K21.9

## 2012-04-20 HISTORY — PX: COLONOSCOPY: SHX5424

## 2012-04-20 HISTORY — DX: Anxiety disorder, unspecified: F41.9

## 2012-04-20 LAB — HEMOGLOBIN AND HEMATOCRIT, BLOOD
HCT: 34.1 % — ABNORMAL LOW (ref 39.0–52.0)
Hemoglobin: 11.3 g/dL — ABNORMAL LOW (ref 13.0–17.0)

## 2012-04-20 SURGERY — COLONOSCOPY
Anesthesia: Moderate Sedation

## 2012-04-20 MED ORDER — MEPERIDINE HCL 50 MG/ML IJ SOLN
INTRAMUSCULAR | Status: DC | PRN
  Administered 2012-04-20 (×2): 25 mg via INTRAVENOUS

## 2012-04-20 MED ORDER — MEPERIDINE HCL 50 MG/ML IJ SOLN
INTRAMUSCULAR | Status: AC
  Filled 2012-04-20: qty 1

## 2012-04-20 MED ORDER — MIDAZOLAM HCL 5 MG/5ML IJ SOLN
INTRAMUSCULAR | Status: AC
  Filled 2012-04-20: qty 10

## 2012-04-20 MED ORDER — STERILE WATER FOR IRRIGATION IR SOLN
Status: DC | PRN
  Administered 2012-04-20: 11:00:00

## 2012-04-20 MED ORDER — MIDAZOLAM HCL 5 MG/5ML IJ SOLN
INTRAMUSCULAR | Status: DC | PRN
  Administered 2012-04-20 (×2): 2 mg via INTRAVENOUS
  Administered 2012-04-20 (×2): 1 mg via INTRAVENOUS

## 2012-04-20 NOTE — Op Note (Signed)
COLONOSCOPY PROCEDURE REPORT  PATIENT:  [name]  MR#:  [id number] Birthdate:  1932/05/20, 78 y.o., male Endoscopist:  Dr. Malissa Hippo, MD Referred By:  Dr. Oneal Deputy. Juanetta Gosling, MD Procedure Date: 04/20/2012  Procedure:   Colonoscopy with snare polypectomy and APC ablation.  Indications:  Patient is 78 year old Caucasian male with history of colonic polyps. He had multiple adenomas removed in April. He still has multiple polyps remaining. He is therefore undergoing therapeutic colonoscopy.  Informed Consent:  The procedure and risks were reviewed with the patient and informed consent was obtained.  Medications:  Demerol 50 mg IV Versed 6 mg IV  Description of procedure:  After a digital rectal exam was performed, that colonoscope was advanced from the anus through the rectum and colon to the area of the cecum, ileocecal valve and appendiceal orifice. The cecum was deeply intubated. These structures were well-seen and photographed for the record. From the level of the cecum and ileocecal valve, the scope was slowly and cautiously withdrawn. The mucosal surfaces were carefully surveyed utilizing scope tip to flexion to facilitate fold flattening as needed. The scope was pulled down into the rectum where a thorough exam including retroflexion was performed.  Findings:   Prep excellent. Reason polyps at cecum ablated via cold biopsy and submitted together. 4 polyp islands odor to the right of appendiceal orifice surrounded by scar. These are felt to be residual polyps at previous polypectomy site. One was biopsied for histology and risks were coagulated with argon plasma coagulator. three 6-7 mm polyps were snared and submitted together. One was at hepatic flexure and 2 at proximal transverse colon. 2 small polyps at transverse colon were coagulated using snare tip. Another 6-7 mm polyp was snared from descending colon. Moderate number of diverticula at sigmoid colon. Normal rectal  mucosa and anorectal junction.  Therapeutic/Diagnostic Maneuvers Performed:  See above  Complications:  None  Cecal Withdrawal Time:  46  minutes  Impression:  Examination performed to cecum. Multiple colonic polyps noted. Three cecal polyps were ablated via cold biopsy and submitted together. Four islands of residual polyp at cecum. One was biopsied and risks were ablated with argon plasma coagulation. Three polyps snared from region of hepatic flexure and transverse colon and submitted together. Two small polyps at transverse colon were coagulated. Small polyps snared from descending colon. Moderate sigmoid colon diverticulosis.  Recommendations:  Standard instructions given. I will contact patient with results of biopsy and further recommendations.  REHMAN,NAJEEB U  04/20/2012 12:11 PM  CC: Dr. [name], MD & Dr. Bonnetta Barry ref. provider found

## 2012-04-20 NOTE — H&P (Signed)
[name] is an 78 y.o. male.   Chief Complaint: Patient is here for colonoscopy with polypectomy. HPI: Patient is 78 year old Caucasian male with history of colonic polyps. Multiple adenomas were removed in April 2013. All of the polyps could not be removed. He is therefore here for therapeutic colonoscopy. He has not any problems with bleeding since warfarin was stopped earlier this year. His hemoglobin also has been gradually coming up.  Past Medical History  Diagnosis Date  . Hypertension   . COPD (chronic obstructive pulmonary disease)   . CAD (coronary artery disease)     s/p 4V CABG '96, Myoview '10 w/o ischemia  . Arthritis   . Atrial fibrillation     coumadin stopped 12/2011 2/2 anemia  . Dysphagia   . Anemia     requiring blood transfusions  . Carotid stenosis     Dr Waverly Ferrari follows.   . Diabetes mellitus, type 2     A1C 6.7% 12/2011  . HLD (hyperlipidemia)     LDL 65 12/2011, intolerant to statins  . Hematuria     felt 2/2 foley insertion  . LV dysfunction     EF 45-50% by echo 2007  . Melena     EGD & Colonoscopy 09/2011 revealed gastritis, erosions, scars, diverticula, 8 colon polyps (largest 1.6cm, not removed 2/2 anticoagulation), small AVM in transverse colon  . History of tobacco abuse     quit 1992  . Anxiety   . Shortness of breath     exertion  . GERD (gastroesophageal reflux disease)     Past Surgical History  Procedure Date  . Knee surgery 1960    Right knee cartilage  . Rotator cuff repair 1990    right   . Inguinal hernia repair 2000    left  . Cholecystectomy 05/2010    Lap chole with biologic mesh repair/reinforcement by  Dr Zachery Dakins  . Back surgery 02/2006    ruptured disk,  post op diskitis infection requiring prolonged hospital stay and  surgical I & D  . Tonsillectomy   . Esophagogastroduodenoscopy 10/08/2011    Procedure: ESOPHAGOGASTRODUODENOSCOPY (EGD);  Surgeon: Malissa Hippo, MD;  Location: [facility];  Service:  Endoscopy;  Laterality: N/A;  . Colonoscopy 10/08/2011    Procedure: COLONOSCOPY;  Surgeon: Malissa Hippo, MD;  Location: [facility];  Service: Endoscopy;  Laterality: N/A;  . Cataract extraction, bilateral   . Peg placement 07/2006    placed due to prolonged infection, poor po intake, malnutrition during several week hospital stay resulting from ruptured disc that became infected following surgery  . Colonoscopy 01/14/2012    Procedure: COLONOSCOPY;  Surgeon: Malissa Hippo, MD;  Location: [facility];  Service: Endoscopy;  Laterality: N/A;  945  . Coronary artery bypass graft 1996    4 by-pass  . Ventral hernia repair     Family History  Problem Relation Age of Onset  . Heart disease Sister   . Heart disease Brother   . Coronary artery disease Mother   . Cancer Father     stomach  . Anesthesia problems Neg Hx   . Hypotension Neg Hx   . Malignant hyperthermia Neg Hx   . Pseudochol deficiency Neg Hx    Social History:  reports that he quit smoking about 21 years ago. His smoking use included Cigarettes. He has a 80 pack-year smoking history. He has never used smokeless tobacco. He reports that he does not drink alcohol or  use illicit drugs.  Allergies:  Allergies  Allergen Reactions  . Statins Other (See Comments)    Causes legs to hurt.   . Tape     Causes skin to peel off.     Medications Prior to Admission  Medication Sig Dispense Refill  . acetaminophen (TYLENOL) 500 MG tablet Take 1 tablet (500 mg total) by mouth every 6 (six) hours as needed. Pain  30 tablet    . amiodarone (PACERONE) 200 MG tablet Take 0.5 tablets (100 mg total) by mouth daily.  30 tablet  5  . amLODipine (NORVASC) 5 MG tablet TAKE ONE TABLET BY MOUTH EVERY DAY FOR BLOOD PRESSURE  90 tablet  4  . aspirin EC 81 MG tablet Take 81 mg by mouth daily.      . diazepam (VALIUM) 5 MG tablet Take 5 mg by mouth every 6 (six) hours as needed. Muscles Spasms      . HYDROcodone-acetaminophen (NORCO) 5-325  MG per tablet Take 1 tablet by mouth every 6 (six) hours as needed. Pain      . metFORMIN (GLUCOPHAGE) 500 MG tablet Take 500 mg by mouth 2 (two) times daily with a meal.      . metoprolol tartrate (LOPRESSOR) 25 MG tablet Take 25 mg by mouth 2 (two) times daily.        . Multiple Vitamin (MULTIVITAMIN) tablet Take 1 tablet by mouth daily.        . nitroGLYCERIN (NITROSTAT) 0.4 MG SL tablet Place 1 tablet (0.4 mg total) under the tongue every 5 (five) minutes x 3 doses as needed for chest pain (up to 3 doses).  25 tablet  3  . pregabalin (LYRICA) 75 MG capsule Take 75 mg by mouth once.      . Red Yeast Rice 600 MG TABS Take 1 tablet by mouth daily.        . silodosin (RAPAFLO) 8 MG CAPS capsule Take 8 mg by mouth daily with breakfast.          Results for orders placed during the hospital encounter of 04/20/12 (from the past 48 hour(s))  GLUCOSE, CAPILLARY     Status: Abnormal   Collection Time   04/20/12 10:07 AM      Component Value Range Comment   Glucose-Capillary 109 (*) 70 - 99 mg/dL    No results found.  ROS  Blood pressure 139/66, temperature 97.9 F (36.6 C), temperature source Oral, resp. rate 18, height 5' 11.5" (1.816 m), weight 195 lb (88.451 kg), SpO2 96.00%. Physical Exam  Constitutional: He appears well-developed and well-nourished.  HENT:  Mouth/Throat: Oropharynx is clear and moist.  Eyes: Conjunctivae are normal. No scleral icterus.  Neck: No thyromegaly present.  Cardiovascular: Normal rate, regular rhythm and normal heart sounds.   No murmur heard. Respiratory: Effort normal and breath sounds normal.  GI: Soft. He exhibits no distension and no mass. There is no tenderness.  Musculoskeletal: He exhibits no edema.  Lymphadenopathy:    He has no cervical adenopathy.  Neurological: He is alert.  Skin: Skin is warm and dry.     Assessment/Plan History of colonic polyps. Colonoscopy with polypectomy.  REHMAN,NAJEEB U 04/20/2012, 11:07 AM

## 2012-04-25 ENCOUNTER — Encounter (HOSPITAL_COMMUNITY): Payer: Self-pay | Admitting: Internal Medicine

## 2012-05-03 ENCOUNTER — Encounter (INDEPENDENT_AMBULATORY_CARE_PROVIDER_SITE_OTHER): Payer: Self-pay | Admitting: *Deleted

## 2012-06-01 ENCOUNTER — Other Ambulatory Visit: Payer: Self-pay | Admitting: *Deleted

## 2012-06-01 DIAGNOSIS — I6529 Occlusion and stenosis of unspecified carotid artery: Secondary | ICD-10-CM

## 2012-06-06 ENCOUNTER — Encounter: Payer: Self-pay | Admitting: Vascular Surgery

## 2012-06-07 ENCOUNTER — Other Ambulatory Visit (INDEPENDENT_AMBULATORY_CARE_PROVIDER_SITE_OTHER): Payer: Medicare Other | Admitting: *Deleted

## 2012-06-07 ENCOUNTER — Encounter: Payer: Self-pay | Admitting: Vascular Surgery

## 2012-06-07 ENCOUNTER — Ambulatory Visit (INDEPENDENT_AMBULATORY_CARE_PROVIDER_SITE_OTHER): Payer: Medicare Other | Admitting: Vascular Surgery

## 2012-06-07 VITALS — BP 133/50 | HR 96 | Resp 12 | Ht 71.5 in | Wt 202.3 lb

## 2012-06-07 DIAGNOSIS — I6529 Occlusion and stenosis of unspecified carotid artery: Secondary | ICD-10-CM

## 2012-06-07 NOTE — Assessment & Plan Note (Signed)
Vision has a 60-79% right carotid stenosis which has remained relatively stable. There is no significant stenosis on the left. He understands we would consider right carotid endarterectomy at the stenosis progressed to greater than 80% or he developed new right hemispheric symptoms. I've ordered a follow carotid duplex scan in 6 months. If this has not changed significantly I'll see him again in one year. It has been any significant change in 6 months and I'll see him at that time.I have discussed with the patient the nature of atherosclerosis, and emphasized the importance of maximal medical management including control of blood pressure, blood glucose, and cholesterol levels, antiplatelet agents, and obtaining regular exercise. He knows to call sooner if he has any problems. In the meantime he knows to continue taking his aspirin.

## 2012-06-07 NOTE — Progress Notes (Signed)
[facility]  Patient name: [name] MRN: [id number] DOB: 04-06-1932 Sex: male  REASON FOR VISIT: follow up of right carotid stenosis.  HPI: [name] is a 78 y.o. male who I have been following with a moderate right carotid stenosis. I last saw him one year ago when he had a 60-79% right carotid stenosis with no significant stenosis on the left. He was to come back for a follow duplex scan in 6 months apparently was sick at that time and missed that appointment he is now one year out from his last duplex. He denies any history of stroke, TIAs, expressive or receptive aphasia, or amaurosis fugax. There has been no other significant change in his medical history.  Past Medical History  Diagnosis Date  . Hypertension   . COPD (chronic obstructive pulmonary disease)   . CAD (coronary artery disease)     s/p 4V CABG '96, Myoview '10 w/o ischemia  . Arthritis   . Atrial fibrillation     coumadin stopped 12/2011 2/2 anemia  . Dysphagia   . Anemia     requiring blood transfusions  . Carotid stenosis     Dr Waverly Ferrari follows.   . Diabetes mellitus, type 2     A1C 6.7% 12/2011  . HLD (hyperlipidemia)     LDL 65 12/2011, intolerant to statins  . Hematuria     felt 2/2 foley insertion  . LV dysfunction     EF 45-50% by echo 2007  . Melena     EGD & Colonoscopy 09/2011 revealed gastritis, erosions, scars, diverticula, 8 colon polyps (largest 1.6cm, not removed 2/2 anticoagulation), small AVM in transverse colon  . History of tobacco abuse     quit 1992  . Anxiety   . Shortness of breath     exertion  . GERD (gastroesophageal reflux disease)   . Myocardial infarction     Family History  Problem Relation Age of Onset  . Heart disease Sister   . Cancer Sister   . Hyperlipidemia Sister   . Hypertension Sister   . Heart disease Brother     Heart Diseast before age 59  . Coronary artery disease Mother   . Cancer Father     stomach    . Anesthesia problems Neg Hx   . Hypotension Neg Hx   . Malignant hyperthermia Neg Hx   . Pseudochol deficiency Neg Hx     SOCIAL HISTORY: History  Substance Use Topics  . Smoking status: Former Smoker -- 2.0 packs/day for 40 years    Types: Cigarettes    Quit date: 09/27/1990  . Smokeless tobacco: Never Used  . Alcohol Use: No    Allergies  Allergen Reactions  . Statins Other (See Comments)    Causes legs to hurt.   . Tape     Causes skin to peel off.     Current Outpatient Prescriptions  Medication Sig Dispense Refill  . acetaminophen (TYLENOL) 500 MG tablet Take 1 tablet (500 mg total) by mouth every 6 (six) hours as needed. Pain  30 tablet    . amiodarone (PACERONE) 200 MG tablet Take 0.5 tablets (100 mg total) by mouth daily.  30 tablet  5  . amLODipine (NORVASC) 5 MG tablet TAKE ONE TABLET BY MOUTH EVERY DAY FOR BLOOD PRESSURE  90 tablet  4  . aspirin 81 MG tablet Take 81 mg by mouth daily.      . diazepam (VALIUM) 5  MG tablet Take 5 mg by mouth every 6 (six) hours as needed. Muscles Spasms      . dutasteride (AVODART) 0.5 MG capsule Take 0.5 mg by mouth daily.      Marland Kitchen HYDROcodone-acetaminophen (NORCO) 5-325 MG per tablet Take 1 tablet by mouth every 6 (six) hours as needed. Pain      . metFORMIN (GLUCOPHAGE) 500 MG tablet Take 500 mg by mouth 2 (two) times daily with a meal.      . metoprolol tartrate (LOPRESSOR) 25 MG tablet Take 25 mg by mouth 2 (two) times daily.        . Multiple Vitamin (MULTIVITAMIN) tablet Take 1 tablet by mouth daily.        . nitroGLYCERIN (NITROSTAT) 0.4 MG SL tablet Place 1 tablet (0.4 mg total) under the tongue every 5 (five) minutes x 3 doses as needed for chest pain (up to 3 doses).  25 tablet  3  . pregabalin (LYRICA) 75 MG capsule Take 75 mg by mouth once.      . Red Yeast Rice 600 MG TABS Take 1 tablet by mouth daily.        . silodosin (RAPAFLO) 8 MG CAPS capsule Take 8 mg by mouth daily with breakfast.          REVIEW OF SYSTEMS:  Arly.Keller ] denotes positive finding; [  ] denotes negative finding  CARDIOVASCULAR:  [ ]  chest pain   [ ]  chest pressure   [ ]  palpitations   [ ]  orthopnea   Arly.Keller ] dyspnea on exertion   Arly.Keller ] claudication right leg  [ ]  rest pain   [ ]  DVT   [ ]  phlebitis PULMONARY:   [ ]  productive cough   [ ]  asthma   [ ]  wheezing NEUROLOGIC:   [ ]  weakness  [ ]  paresthesias  [ ]  aphasia  [ ]  amaurosis  [ ]  dizziness HEMATOLOGIC:   [ ]  bleeding problems   [ ]  clotting disorders MUSCULOSKELETAL:  [ ]  joint pain   [ ]  joint swelling [ ]  leg swelling GASTROINTESTINAL: [ ]   blood in stool  [ ]   hematemesis GENITOURINARY:  [ ]   dysuria  [ ]   hematuria PSYCHIATRIC:  [ ]  history of major depression INTEGUMENTARY:  [ ]  rashes  [ ]  ulcers CONSTITUTIONAL:  [ ]  fever   [ ]  chills  PHYSICAL EXAM: Filed Vitals:   06/07/12 1502 06/07/12 1503  BP: 124/49 133/50  Pulse: 60 96  Resp: 18 12  Height: 5' 11.5" (1.816 m)   Weight: 202 lb 4.8 oz (91.763 kg)   SpO2: 97% 96%   Body mass index is 27.82 kg/(m^2). GENERAL: The patient is a well-nourished male, in no acute distress. The vital signs are documented above. CARDIOVASCULAR: There is a regular rate and rhythm. He has bilateral carotid bruits. Both feet are warm and well-perfused. He has no significant lower extremity swelling PULMONARY: There is good air exchange bilaterally without wheezing or rales. ABDOMEN: Soft and non-tender with normal pitched bowel sounds.  MUSCULOSKELETAL: There are no major deformities or cyanosis. NEUROLOGIC: No focal weakness or paresthesias are detected. SKIN: There are no ulcers or rashes noted. PSYCHIATRIC: The patient has a normal affect.  DATA:  I have independently interpreted his carotid duplex scan which shows a 60-79% right carotid stenosis with no significant stenosis on the left. Both vertebral arteries are patent with normally directed flow. The velocities on the right and increased only slightly compared to  one year ago.  MEDICAL  ISSUES:  CAROTID ARTERY STENOSIS [facility] has a 60-79% right carotid stenosis which has remained relatively stable. There is no significant stenosis on the left. He understands we would consider right carotid endarterectomy at the stenosis progressed to greater than 80% or he developed new right hemispheric symptoms. I've ordered a follow carotid duplex scan in 6 months. If this has not changed significantly I'll see him again in one year. It has been any significant change in 6 months and I'll see him at that time.I have discussed with the patient the nature of atherosclerosis, and emphasized the importance of maximal medical management including control of blood pressure, blood glucose, and cholesterol levels, antiplatelet agents, and obtaining regular exercise. He knows to call sooner if he has any problems. In the meantime he knows to continue taking his aspirin.     DICKSON,CHRISTOPHER S [facility] [place] [facility]: [phone]

## 2012-06-08 NOTE — Addendum Note (Signed)
Addended by: Sharee Pimple on: 06/08/2012 08:13 AM   Modules accepted: Orders

## 2012-08-09 ENCOUNTER — Other Ambulatory Visit (HOSPITAL_COMMUNITY): Payer: Self-pay | Admitting: Pulmonary Disease

## 2012-08-09 DIAGNOSIS — R221 Localized swelling, mass and lump, neck: Secondary | ICD-10-CM

## 2012-08-11 ENCOUNTER — Ambulatory Visit (HOSPITAL_COMMUNITY)
Admission: RE | Admit: 2012-08-11 | Discharge: 2012-08-11 | Disposition: A | Discharge status: Home / Self Care | Payer: Medicare Other | Source: Ambulatory Visit | Attending: Pulmonary Disease | Admitting: Pulmonary Disease

## 2012-08-11 DIAGNOSIS — I6529 Occlusion and stenosis of unspecified carotid artery: Secondary | ICD-10-CM | POA: Insufficient documentation

## 2012-08-11 DIAGNOSIS — R221 Localized swelling, mass and lump, neck: Secondary | ICD-10-CM

## 2012-08-11 DIAGNOSIS — R220 Localized swelling, mass and lump, head: Secondary | ICD-10-CM | POA: Insufficient documentation

## 2012-08-11 MED ORDER — IOHEXOL 300 MG/ML  SOLN
100.0000 mL | Freq: Once | INTRAMUSCULAR | Status: AC | PRN
  Administered 2012-08-11: 100 mL via INTRAVENOUS

## 2012-08-12 ENCOUNTER — Inpatient Hospital Stay (HOSPITAL_COMMUNITY)
Admission: EM | Admit: 2012-08-12 | Discharge: 2012-08-15 | DRG: 804 | Disposition: A | Discharge status: Home / Self Care | Payer: Medicare Other | Attending: Pulmonary Disease | Admitting: Pulmonary Disease

## 2012-08-12 ENCOUNTER — Emergency Department (HOSPITAL_COMMUNITY): Payer: Medicare Other

## 2012-08-12 ENCOUNTER — Encounter (HOSPITAL_COMMUNITY): Payer: Self-pay

## 2012-08-12 DIAGNOSIS — Z8249 Family history of ischemic heart disease and other diseases of the circulatory system: Secondary | ICD-10-CM

## 2012-08-12 DIAGNOSIS — I4891 Unspecified atrial fibrillation: Secondary | ICD-10-CM | POA: Diagnosis present

## 2012-08-12 DIAGNOSIS — N289 Disorder of kidney and ureter, unspecified: Secondary | ICD-10-CM | POA: Diagnosis present

## 2012-08-12 DIAGNOSIS — L723 Sebaceous cyst: Secondary | ICD-10-CM | POA: Diagnosis present

## 2012-08-12 DIAGNOSIS — I208 Other forms of angina pectoris: Secondary | ICD-10-CM | POA: Diagnosis present

## 2012-08-12 DIAGNOSIS — I252 Old myocardial infarction: Secondary | ICD-10-CM

## 2012-08-12 DIAGNOSIS — I779 Disorder of arteries and arterioles, unspecified: Secondary | ICD-10-CM | POA: Diagnosis present

## 2012-08-12 DIAGNOSIS — I6529 Occlusion and stenosis of unspecified carotid artery: Secondary | ICD-10-CM | POA: Diagnosis present

## 2012-08-12 DIAGNOSIS — M1990 Unspecified osteoarthritis, unspecified site: Secondary | ICD-10-CM | POA: Diagnosis present

## 2012-08-12 DIAGNOSIS — G609 Hereditary and idiopathic neuropathy, unspecified: Secondary | ICD-10-CM | POA: Diagnosis present

## 2012-08-12 DIAGNOSIS — Z951 Presence of aortocoronary bypass graft: Secondary | ICD-10-CM

## 2012-08-12 DIAGNOSIS — Z87891 Personal history of nicotine dependence: Secondary | ICD-10-CM

## 2012-08-12 DIAGNOSIS — R0602 Shortness of breath: Secondary | ICD-10-CM

## 2012-08-12 DIAGNOSIS — Z7982 Long term (current) use of aspirin: Secondary | ICD-10-CM

## 2012-08-12 DIAGNOSIS — Z79899 Other long term (current) drug therapy: Secondary | ICD-10-CM

## 2012-08-12 DIAGNOSIS — N400 Enlarged prostate without lower urinary tract symptoms: Secondary | ICD-10-CM | POA: Diagnosis present

## 2012-08-12 DIAGNOSIS — R079 Chest pain, unspecified: Secondary | ICD-10-CM

## 2012-08-12 DIAGNOSIS — D696 Thrombocytopenia, unspecified: Secondary | ICD-10-CM | POA: Diagnosis present

## 2012-08-12 DIAGNOSIS — Z96659 Presence of unspecified artificial knee joint: Secondary | ICD-10-CM

## 2012-08-12 DIAGNOSIS — I2510 Atherosclerotic heart disease of native coronary artery without angina pectoris: Secondary | ICD-10-CM | POA: Diagnosis present

## 2012-08-12 DIAGNOSIS — M129 Arthropathy, unspecified: Secondary | ICD-10-CM | POA: Diagnosis present

## 2012-08-12 DIAGNOSIS — D649 Anemia, unspecified: Principal | ICD-10-CM | POA: Diagnosis present

## 2012-08-12 DIAGNOSIS — E119 Type 2 diabetes mellitus without complications: Secondary | ICD-10-CM | POA: Diagnosis present

## 2012-08-12 DIAGNOSIS — J449 Chronic obstructive pulmonary disease, unspecified: Secondary | ICD-10-CM | POA: Diagnosis present

## 2012-08-12 DIAGNOSIS — J4489 Other specified chronic obstructive pulmonary disease: Secondary | ICD-10-CM | POA: Diagnosis present

## 2012-08-12 DIAGNOSIS — K5520 Angiodysplasia of colon without hemorrhage: Secondary | ICD-10-CM | POA: Diagnosis present

## 2012-08-12 DIAGNOSIS — I739 Peripheral vascular disease, unspecified: Secondary | ICD-10-CM | POA: Diagnosis present

## 2012-08-12 DIAGNOSIS — Z809 Family history of malignant neoplasm, unspecified: Secondary | ICD-10-CM

## 2012-08-12 DIAGNOSIS — K219 Gastro-esophageal reflux disease without esophagitis: Secondary | ICD-10-CM | POA: Diagnosis present

## 2012-08-12 DIAGNOSIS — R599 Enlarged lymph nodes, unspecified: Secondary | ICD-10-CM | POA: Diagnosis present

## 2012-08-12 DIAGNOSIS — I2089 Other forms of angina pectoris: Secondary | ICD-10-CM | POA: Diagnosis present

## 2012-08-12 DIAGNOSIS — E785 Hyperlipidemia, unspecified: Secondary | ICD-10-CM | POA: Diagnosis present

## 2012-08-12 LAB — BASIC METABOLIC PANEL
CO2: 24 mEq/L (ref 19–32)
Chloride: 106 mEq/L (ref 96–112)
Glucose, Bld: 144 mg/dL — ABNORMAL HIGH (ref 70–99)
Sodium: 141 mEq/L (ref 135–145)

## 2012-08-12 LAB — CBC WITH DIFFERENTIAL/PLATELET
Basophils Relative: 13 % — ABNORMAL HIGH (ref 0–1)
Eosinophils Relative: 8 % — ABNORMAL HIGH (ref 0–5)
HCT: 21.7 % — ABNORMAL LOW (ref 39.0–52.0)
Hemoglobin: 7.4 g/dL — ABNORMAL LOW (ref 13.0–17.0)
Lymphocytes Relative: 23 % (ref 12–46)
MCH: 34.7 pg — ABNORMAL HIGH (ref 26.0–34.0)
MCHC: 34.1 g/dL (ref 30.0–36.0)
Neutro Abs: 2.4 10*3/uL (ref 1.7–7.7)
Neutrophils Relative %: 51 % (ref 43–77)
RBC: 2.13 MIL/uL — ABNORMAL LOW (ref 4.22–5.81)

## 2012-08-12 LAB — HEPATIC FUNCTION PANEL
Albumin: 3.6 g/dL (ref 3.5–5.2)
Indirect Bilirubin: 0.3 mg/dL (ref 0.3–0.9)
Total Protein: 6.6 g/dL (ref 6.0–8.3)

## 2012-08-12 LAB — GLUCOSE, CAPILLARY: Glucose-Capillary: 157 mg/dL — ABNORMAL HIGH (ref 70–99)

## 2012-08-12 LAB — IRON AND TIBC
Iron: 160 ug/dL — ABNORMAL HIGH (ref 42–135)
TIBC: 279 ug/dL (ref 215–435)

## 2012-08-12 LAB — PRO B NATRIURETIC PEPTIDE: Pro B Natriuretic peptide (BNP): 2445 pg/mL — ABNORMAL HIGH (ref 0–450)

## 2012-08-12 LAB — TROPONIN I
Troponin I: <0.3 ng/mL (ref <0.30)
Troponin I: <0.3 ng/mL (ref <0.30)
Troponin I: <0.3 ng/mL (ref <0.30)

## 2012-08-12 LAB — PREPARE RBC (CROSSMATCH)

## 2012-08-12 LAB — FERRITIN: Ferritin: 278 ng/mL (ref 22–322)

## 2012-08-12 MED ORDER — ONDANSETRON HCL 4 MG/2ML IJ SOLN
4.0000 mg | Freq: Once | INTRAMUSCULAR | Status: AC
  Administered 2012-08-12: 4 mg via INTRAVENOUS
  Filled 2012-08-12: qty 2

## 2012-08-12 MED ORDER — ASPIRIN 81 MG PO CHEW
324.0000 mg | CHEWABLE_TABLET | Freq: Once | ORAL | Status: AC
  Administered 2012-08-12: 324 mg via ORAL
  Filled 2012-08-12: qty 4

## 2012-08-12 MED ORDER — NITROGLYCERIN 0.4 MG SL SUBL: 0.4000 mg | SUBLINGUAL_TABLET | SUBLINGUAL | Status: DC | PRN

## 2012-08-12 MED ORDER — INSULIN ASPART 100 UNIT/ML ~~LOC~~ SOLN
0.0000 [IU] | Freq: Three times a day (TID) | SUBCUTANEOUS | Status: DC
  Administered 2012-08-12: 1 [IU] via SUBCUTANEOUS
  Administered 2012-08-13: 2 [IU] via SUBCUTANEOUS
  Administered 2012-08-13 – 2012-08-15 (×3): 1 [IU] via SUBCUTANEOUS

## 2012-08-12 MED ORDER — ONDANSETRON HCL 4 MG PO TABS: 4.0000 mg | ORAL_TABLET | Freq: Four times a day (QID) | ORAL | Status: DC | PRN

## 2012-08-12 MED ORDER — ONDANSETRON HCL 4 MG/2ML IJ SOLN: 4.0000 mg | Freq: Four times a day (QID) | INTRAMUSCULAR | Status: DC | PRN

## 2012-08-12 MED ORDER — PANTOPRAZOLE SODIUM 40 MG IV SOLR
40.0000 mg | Freq: Every day | INTRAVENOUS | Status: DC
  Administered 2012-08-12 – 2012-08-14 (×3): 40 mg via INTRAVENOUS
  Filled 2012-08-12 (×3): qty 40

## 2012-08-12 MED ORDER — SODIUM CHLORIDE 0.9 % IV SOLN
INTRAVENOUS | Status: DC
  Administered 2012-08-12: 09:00:00 via INTRAVENOUS

## 2012-08-12 MED ORDER — HYDROCODONE-ACETAMINOPHEN 5-325 MG PO TABS: 1.0000 | ORAL_TABLET | Freq: Four times a day (QID) | ORAL | Status: DC | PRN

## 2012-08-12 MED ORDER — ASPIRIN 81 MG PO CHEW
81.0000 mg | CHEWABLE_TABLET | Freq: Every day | ORAL | Status: DC
  Administered 2012-08-13 – 2012-08-15 (×3): 81 mg via ORAL
  Filled 2012-08-12 (×3): qty 1

## 2012-08-12 MED ORDER — ADULT MULTIVITAMIN W/MINERALS CH
1.0000 | ORAL_TABLET | Freq: Every day | ORAL | Status: DC
  Administered 2012-08-12 – 2012-08-15 (×4): 1 via ORAL
  Filled 2012-08-12 (×4): qty 1

## 2012-08-12 MED ORDER — AMLODIPINE BESYLATE 5 MG PO TABS
5.0000 mg | ORAL_TABLET | Freq: Every day | ORAL | Status: DC
  Administered 2012-08-12 – 2012-08-15 (×4): 5 mg via ORAL
  Filled 2012-08-12 (×4): qty 1

## 2012-08-12 MED ORDER — METOPROLOL TARTRATE 25 MG PO TABS
25.0000 mg | ORAL_TABLET | Freq: Two times a day (BID) | ORAL | Status: DC
  Administered 2012-08-12 – 2012-08-15 (×7): 25 mg via ORAL
  Filled 2012-08-12 (×7): qty 1

## 2012-08-12 MED ORDER — MORPHINE SULFATE 2 MG/ML IJ SOLN
2.0000 mg | Freq: Once | INTRAMUSCULAR | Status: AC
  Administered 2012-08-12: 2 mg via INTRAVENOUS
  Filled 2012-08-12: qty 1

## 2012-08-12 MED ORDER — TAMSULOSIN HCL 0.4 MG PO CAPS
0.4000 mg | ORAL_CAPSULE | Freq: Every day | ORAL | Status: DC
  Administered 2012-08-12 – 2012-08-15 (×4): 0.4 mg via ORAL
  Filled 2012-08-12 (×4): qty 1

## 2012-08-12 MED ORDER — INSULIN ASPART 100 UNIT/ML ~~LOC~~ SOLN: 0.0000 [IU] | Freq: Every day | SUBCUTANEOUS | Status: DC

## 2012-08-12 MED ORDER — SODIUM CHLORIDE 0.9 % IV SOLN
INTRAVENOUS | Status: DC
  Administered 2012-08-12: 16:00:00 via INTRAVENOUS

## 2012-08-12 MED ORDER — DIAZEPAM 5 MG PO TABS: 5.0000 mg | ORAL_TABLET | Freq: Four times a day (QID) | ORAL | Status: DC | PRN

## 2012-08-12 MED ORDER — PREGABALIN 75 MG PO CAPS
75.0000 mg | ORAL_CAPSULE | Freq: Once | ORAL | Status: AC
  Administered 2012-08-12: 75 mg via ORAL
  Filled 2012-08-12: qty 1

## 2012-08-12 MED ORDER — METFORMIN HCL 500 MG PO TABS
500.0000 mg | ORAL_TABLET | Freq: Two times a day (BID) | ORAL | Status: DC
  Administered 2012-08-12 – 2012-08-15 (×5): 500 mg via ORAL
  Filled 2012-08-12 (×3): qty 1
  Filled 2012-08-12: qty 11
  Filled 2012-08-12: qty 1

## 2012-08-12 MED ORDER — ACETAMINOPHEN 650 MG RE SUPP: 650.0000 mg | Freq: Four times a day (QID) | RECTAL | Status: DC | PRN

## 2012-08-12 MED ORDER — AMIODARONE HCL 200 MG PO TABS
100.0000 mg | ORAL_TABLET | Freq: Every day | ORAL | Status: DC
  Administered 2012-08-12 – 2012-08-15 (×4): 100 mg via ORAL
  Filled 2012-08-12 (×4): qty 1

## 2012-08-12 MED ORDER — ALUM & MAG HYDROXIDE-SIMETH 200-200-20 MG/5ML PO SUSP: 30.0000 mL | Freq: Four times a day (QID) | ORAL | Status: DC | PRN

## 2012-08-12 MED ORDER — ACETAMINOPHEN 325 MG PO TABS: 650.0000 mg | ORAL_TABLET | Freq: Four times a day (QID) | ORAL | Status: DC | PRN

## 2012-08-12 MED ORDER — ALBUTEROL SULFATE (5 MG/ML) 0.5% IN NEBU
INHALATION_SOLUTION | RESPIRATORY_TRACT | Status: AC
  Administered 2012-08-12: 2.5 mg
  Filled 2012-08-12: qty 0.5

## 2012-08-12 NOTE — ED Notes (Addendum)
Pt reports had some blood work done in Dr. Adah Perl office this week and was told he was anemic.  Reports has had SOB for the past 2 weeks and chest pain off and on since Thursday.  This morning around 2am woke up with chest pain.  Has taken 3 nitro.  Says nitro helped a little.  Has also taken tums and zantac.  Pt says pain is nonradiating and is in center of chest.

## 2012-08-12 NOTE — ED Provider Notes (Signed)
History   This chart was scribed for Shelda Jakes, MD, by Marcina Millard scribe. The patient was seen in room APA10/APA10 and the patient's care was started at 0722.      CSN: [id number]  Arrival date & time 08/12/12  7829   None     Chief Complaint  Patient presents with  . Chest Pain    (Consider location/radiation/quality/duration/timing/severity/associated sxs/prior treatment) HPI Comments: [name] is a 78 y.o. male who presents to the Emergency Department complaining of constant, moderate chest pain with associated tightness and SOB that woke him up at 2 am. He reports that the pain and tightness took his breath away, and he rated it as 7/10. He also reports intermittent, mild central chest pain that began 3 days ago. He reports that he took 1 nitroglycerin every day until today, which resolved the pain at the time. He states that the current chest tightness has been gradually improving since 7:30, and the pain has since resolved. He reports that he took 3 nitroglycerin hourly from 2-4 am. He reports that the constant, mild SOB peaked at 2 am, but that he was experiencing exertional SOB yesterday. He states that he has some mild chest pain every week, but he that this episode has lasted longer and has been more painful than normal. He also states that he has only take 8 nitroglycerin since April, but has taken 5 in the past 3 days. He wife states that until today that he has been functioning normally this week. He has a h/o of quadruple bypass surgery in 1996. His wife reports that he has not taken any of his medication today other than nitroglycerin.   PCP is Dr. Juanetta Gosling. Cardiologist is Dr. Tenny Craw with [facility].         Past Medical History  Diagnosis Date  . Hypertension   . COPD (chronic obstructive pulmonary disease)   . CAD (coronary artery disease)     s/p 4V CABG '96, Myoview '10 w/o ischemia  . Arthritis   . Atrial fibrillation     coumadin stopped  12/2011 2/2 anemia  . Dysphagia   . Anemia     requiring blood transfusions  . Carotid stenosis     Dr Waverly Ferrari follows.   . Diabetes mellitus, type 2     A1C 6.7% 12/2011  . HLD (hyperlipidemia)     LDL 65 12/2011, intolerant to statins  . Hematuria     felt 2/2 foley insertion  . LV dysfunction     EF 45-50% by echo 2007  . Melena     EGD & Colonoscopy 09/2011 revealed gastritis, erosions, scars, diverticula, 8 colon polyps (largest 1.6cm, not removed 2/2 anticoagulation), small AVM in transverse colon  . History of tobacco abuse     quit 1992  . Anxiety   . Shortness of breath     exertion  . GERD (gastroesophageal reflux disease)   . Myocardial infarction     Past Surgical History  Procedure Date  . Knee surgery 1960    Right knee cartilage  . Rotator cuff repair 1990    right   . Inguinal hernia repair 2000 and 2010    left  . Cholecystectomy 05/2010    Lap chole with biologic mesh repair/reinforcement by  Dr Zachery Dakins  . Back surgery 02/2006    ruptured disk,  post op diskitis infection requiring prolonged hospital stay and  surgical I & D  . Tonsillectomy   . Esophagogastroduodenoscopy  10/08/2011    Procedure: ESOPHAGOGASTRODUODENOSCOPY (EGD);  Surgeon: Malissa Hippo, MD;  Location: [facility];  Service: Endoscopy;  Laterality: N/A;  . Colonoscopy 10/08/2011    Procedure: COLONOSCOPY;  Surgeon: Malissa Hippo, MD;  Location: [facility];  Service: Endoscopy;  Laterality: N/A;  . Cataract extraction, bilateral   . Peg placement 07/2006    placed due to prolonged infection, poor po intake, malnutrition during several week hospital stay resulting from ruptured disc that became infected following surgery  . Colonoscopy 01/14/2012    Procedure: COLONOSCOPY;  Surgeon: Malissa Hippo, MD;  Location: [facility];  Service: Endoscopy;  Laterality: N/A;  945  . Coronary artery bypass graft 1996    4 by-pass  . Ventral hernia repair   . Colonoscopy 04/20/2012     Procedure: COLONOSCOPY;  Surgeon: Malissa Hippo, MD;  Location: [facility];  Service: Endoscopy;  Laterality: N/A;  1030  . Pr vein bypass graft,aorto-fem-pop 1996  . Spine surgery   . Joint replacement 1960    Right Knee    Family History  Problem Relation Age of Onset  . Heart disease Sister   . Cancer Sister   . Hyperlipidemia Sister   . Hypertension Sister   . Heart disease Brother     Heart Diseast before age 47  . Coronary artery disease Mother   . Cancer Father     stomach  . Anesthesia problems Neg Hx   . Hypotension Neg Hx   . Malignant hyperthermia Neg Hx   . Pseudochol deficiency Neg Hx     History  Substance Use Topics  . Smoking status: Former Smoker -- 2.0 packs/day for 40 years    Types: Cigarettes    Quit date: 09/27/1990  . Smokeless tobacco: Never Used  . Alcohol Use: No      Review of Systems  Respiratory: Positive for chest tightness and shortness of breath.   Cardiovascular: Positive for chest pain.  Gastrointestinal: Negative for nausea, vomiting and diarrhea.  All other systems reviewed and are negative.    Allergies  Statins and Tape  Home Medications   Current Outpatient Rx  Name  Route  Sig  Dispense  Refill  . ACETAMINOPHEN 500 MG PO TABS   Oral   Take 1 tablet (500 mg total) by mouth every 6 (six) hours as needed. Pain   30 tablet      . AMIODARONE HCL 200 MG PO TABS   Oral   Take 0.5 tablets (100 mg total) by mouth daily.   30 tablet   5   . AMLODIPINE BESYLATE 5 MG PO TABS   Oral   Take 5 mg by mouth daily.         . ASPIRIN 81 MG PO TABS   Oral   Take 81 mg by mouth daily.         Marland Kitchen DIAZEPAM 5 MG PO TABS   Oral   Take 5 mg by mouth every 6 (six) hours as needed. Muscles Spasms         . HYDROCODONE-ACETAMINOPHEN 5-325 MG PO TABS   Oral   Take 1 tablet by mouth every 6 (six) hours as needed. Pain         . METFORMIN HCL 500 MG PO TABS   Oral   Take 500 mg by mouth 2 (two) times daily with a  meal.         . METOPROLOL TARTRATE 25 MG  PO TABS   Oral   Take 25 mg by mouth 2 (two) times daily.           Marland Kitchen ONE-DAILY MULTI VITAMINS PO TABS   Oral   Take 1 tablet by mouth daily.           Marland Kitchen NITROGLYCERIN 0.4 MG SL SUBL   Sublingual   Place 1 tablet (0.4 mg total) under the tongue every 5 (five) minutes x 3 doses as needed for chest pain (up to 3 doses).   25 tablet   3   . PREGABALIN 75 MG PO CAPS   Oral   Take 75 mg by mouth once.         . RED YEAST RICE 600 MG PO TABS   Oral   Take 1 tablet by mouth daily.           Marland Kitchen SILODOSIN 8 MG PO CAPS   Oral   Take 8 mg by mouth daily with breakfast.             BP 135/48  Pulse 67  Temp 98.1 F (36.7 C) (Oral)  Resp 22  SpO2 92%  Physical Exam  Nursing note and vitals reviewed. Constitutional: He is oriented to person, place, and time. He appears well-developed and well-nourished.  Eyes: Conjunctivae normal and EOM are normal. Pupils are equal, round, and reactive to light. Right eye exhibits no discharge. Left eye exhibits no discharge. No scleral icterus.  Neck: Normal range of motion.       He has a nodule that is mobile and not attached. It is 3 cm and non-tender.  Cardiovascular: Normal rate and regular rhythm.   Pulmonary/Chest: Effort normal and breath sounds normal.  Abdominal: Bowel sounds are normal. There is no tenderness.  Musculoskeletal:       He has no swelling in his legs.  Lymphadenopathy:    He has cervical adenopathy.  Neurological: He is alert and oriented to person, place, and time. No cranial nerve deficit. He exhibits normal muscle tone. Coordination normal.  Psychiatric: He has a normal mood and affect. His behavior is normal.    ED Course  Procedures (including critical care time)  DIAGNOSTIC STUDIES: Oxygen Saturation is 98% on room air, normal by my interpretation.    COORDINATION OF CARE:  08:30- Discussed planned course of treatment with the patient, including a  chest X-ray and blood work, who is agreeable at this time.  08:30- Medication Orders- ondansetron (ZOFRAN) injection 4 mg- Once, morphine 2 mg/ml injection 2 mg-Once.  08:45- Medication Orders- 0.9% sodium chloride infusion-continuous, aspirin chewable tablet 324 mg-Once.  10:44- Recheck- His initial heart marker was negative. His chest X-ray was normal. We are going to run the the heart marker again to see if it is normal to determine which facility he needs to be admitted to. His anemia is much worse than normal. He is going to need blood transfusions.  12:16- Consultation with Dr. Rayburn Ma regarding the pt's hemoglobin levels.  Results for orders placed during the hospital encounter of 08/12/12  CBC WITH DIFFERENTIAL      Component Value Range   WBC 5.0  4.0 - 10.5 K/uL   RBC 2.13 (*) 4.22 - 5.81 MIL/uL   Hemoglobin 7.4 (*) 13.0 - 17.0 g/dL   HCT 16.1 (*) 09.6 - 04.5 %   MCV 101.9 (*) 78.0 - 100.0 fL   MCH 34.7 (*) 26.0 - 34.0 pg   MCHC 34.1  30.0 -  36.0 g/dL   RDW 45.4 (*) 09.8 - 11.9 %   Platelets 156  150 - 400 K/uL   Neutrophils Relative 51  43 - 77 %   Lymphocytes Relative 23  12 - 46 %   Monocytes Relative 5  3 - 12 %   Eosinophils Relative 8 (*) 0 - 5 %   Basophils Relative 13 (*) 0 - 1 %   Neutro Abs 2.4  1.7 - 7.7 K/uL   Lymphs Abs 1.2  0.7 - 4.0 K/uL   Monocytes Absolute 0.3  0.1 - 1.0 K/uL   Eosinophils Absolute 0.4  0.0 - 0.7 K/uL   Basophils Absolute 0.7 (*) 0.0 - 0.1 K/uL   WBC Morphology WHITE COUNT CONFIRMED ON SMEAR     Smear Review PENDING PATHOLOGIST REVIEW    BASIC METABOLIC PANEL      Component Value Range   Sodium 141  135 - 145 mEq/L   Potassium 3.9  3.5 - 5.1 mEq/L   Chloride 106  96 - 112 mEq/L   CO2 24  19 - 32 mEq/L   Glucose, Bld 144 (*) 70 - 99 mg/dL   BUN 21  6 - 23 mg/dL   Creatinine, Ser 1.47  0.50 - 1.35 mg/dL   Calcium 8.8  8.4 - 82.9 mg/dL   GFR calc non Af Amer 55 (*) >90 mL/min   GFR calc Af Amer 64 (*) >90 mL/min  TROPONIN I       Component Value Range   Troponin I <0.30  <0.30 ng/mL  HEPATIC FUNCTION PANEL      Component Value Range   Total Protein 6.6  6.0 - 8.3 g/dL   Albumin 3.6  3.5 - 5.2 g/dL   AST 18  0 - 37 U/L   ALT 13  0 - 53 U/L   Alkaline Phosphatase 61  39 - 117 U/L   Total Bilirubin 0.4  0.3 - 1.2 mg/dL   Bilirubin, Direct 0.1  0.0 - 0.3 mg/dL   Indirect Bilirubin 0.3  0.3 - 0.9 mg/dL  PRO B NATRIURETIC PEPTIDE      Component Value Range   Pro B Natriuretic peptide (BNP) 2445.0 (*) 0 - 450 pg/mL  TROPONIN I      Component Value Range   Troponin I <0.30  <0.30 ng/mL  PREPARE RBC (CROSSMATCH)      Component Value Range   Order Confirmation ORDER PROCESSED BY BLOOD BANK    TYPE AND SCREEN      Component Value Range   ABO/RH(D) A NEG     Antibody Screen NEG     Sample Expiration 08/15/2012     Unit Number [id number]     Blood Component Type RED CELLS,LR     Unit division 00     Status of Unit ALLOCATED     Transfusion Status OK TO TRANSFUSE     Crossmatch Result Compatible      Labs Reviewed  CBC WITH DIFFERENTIAL - Abnormal; Notable for the following:    RBC 2.13 (*)     Hemoglobin 7.4 (*)     HCT 21.7 (*)     MCV 101.9 (*)     MCH 34.7 (*)     RDW 18.1 (*)     Eosinophils Relative 8 (*)     Basophils Relative 13 (*)     Basophils Absolute 0.7 (*)     All other components within normal limits  BASIC METABOLIC PANEL - Abnormal;  Notable for the following:    Glucose, Bld 144 (*)     GFR calc non Af Amer 55 (*)     GFR calc Af Amer 64 (*)     All other components within normal limits  PRO B NATRIURETIC PEPTIDE - Abnormal; Notable for the following:    Pro B Natriuretic peptide (BNP) 2445.0 (*)     All other components within normal limits  TROPONIN I  HEPATIC FUNCTION PANEL  TROPONIN I  PREPARE RBC (CROSSMATCH)  TYPE AND SCREEN  PATHOLOGIST SMEAR REVIEW   Dg Chest 2 View  08/12/2012  *RADIOLOGY REPORT*  Clinical Data: Chest pain.  CHEST - 2 VIEW  Comparison: 01/03/2012  and 10/06/2011  Findings: Two views of the chest demonstrate previous CABG procedure.  There may be mild volume loss at the right lung base. There are subtle densities at the right lung base that appear new from the prior examinations.  Heart size is stable.  Left lung appears clear.  No definite pleural effusions.  IMPRESSION: Subtle densities at the right lung base.  The differential diagnosis would include atelectasis versus subtle airspace disease.   Original Report Authenticated By: Richarda Overlie, M.D.    Ct Soft Tissue Neck W Contrast  08/11/2012  *RADIOLOGY REPORT*  Clinical Data: Soft tissue knot left-side of neck present 2 years appears to have enlarged.    Denies pain.  CT NECK WITH CONTRAST  Technique:  Multidetector CT imaging of the neck was performed with intravenous contrast.  Contrast: OMNIPAQUE IOHEXOL 300 MG/ML  SOLN  Comparison: 01/18/2012 and 11/06/2010.  Findings: The subcutaneous nodule at the posterior lateral margin of the left sternocleidomastoid muscle where the patient has a palpable abnormality has increased minimally in size since the most remote exam of 11/06/2010 now measuring 9.3 x 8.1 x 9.6 mm versus prior 9.0 x 8.1 x 8.2 mm.  Etiology/significance indeterminate. This does not have a cleft like appearance as seen with a simple lymph node.  This may represent a benign subcutaneous lesion which has become irritated/inflamed causing patient's discomfort.  Slow growing tumor not excluded.  Present exam is motion degraded and not performed as a CT angiogram however, there appears to have been progression of diffuse atherosclerotic type changes most notable involving the right internal carotid artery where there is severe narrowing with a string like appearance.  Stable appearance of lung parenchymal changes.  Stable heterogeneous appearance of the thyroid gland more notable on the left.  Cervical spondylotic changes most notable C3-4.  Underfilling of the left piriform sinus without  definitive mass identified.  Slightly prominent lingual tonsillar tissue fills the vallecula.  IMPRESSION: The subcutaneous nodule at the posterior lateral margin of the left sternocleidomastoid muscle where the patient has a palpable abnormality has increased minimally in size since the most remote exam of 11/06/2010 now measuring 9.3 x 8.1 x 9.6 mm versus prior 9.0 x 8.1 x 8.2 mm.  Etiology/significance indeterminate. This does not have a cleft like appearance as seen with a simple lymph node. This may represent a benign subcutaneous lesion which has become irritated/inflamed causing patient's discomfort.  Slow growing tumor not excluded.  Right nternal carotid artery severe narrowing with a string like appearance.  Stable heterogeneous appearance of the thyroid gland more notable on the left.  Underfilling of the left piriform sinus without definitive mass identified.   Original Report Authenticated By: Lacy Duverney, M.D.     Date: 08/12/2012  Rate: 71  Rhythm: normal sinus rhythm  QRS Axis: normal  Intervals: QT prolonged  ST/T Wave abnormalities: nonspecific ST changes  Conduction Disutrbances:none  Narrative Interpretation:   Old EKG Reviewed: unchanged From 01/05/12   1. Anemia   2. Shortness of breath   3. Chest pain     CRITICAL CARE Performed by: Shelda Jakes.   Total critical care time: 30  Critical care time was exclusive of separately billable procedures and treating other patients.  Critical care was necessary to treat or prevent imminent or life-threatening deterioration.  Critical care was time spent personally by me on the following activities: development of treatment plan with patient and/or surrogate as well as nursing, discussions with consultants, evaluation of patient's response to treatment, examination of patient, obtaining history from patient or surrogate, ordering and performing treatments and interventions, ordering and review of laboratory studies,  ordering and review of radiographic studies, pulse oximetry and re-evaluation of patient's condition.   MDM  Discussed with Dr. Ouida Sills. He will admit for Dr. Juanetta Gosling. Bleed patient's chest discomfort shortness of breath is all related to his significant anemia with a hemoglobin of 7.5. Unit of blood has been ordered to be started in the ED Dr. Ouida Sills will come and get him admitted. Patient has an extensive cardiac history but troponin x2 is been negative here he is known to get short of breath and chest tightness when his anemia is present. Patient has been transfused in the past anemia is felt to be related to GI source but has not been proven. Chest x-rays negative for pneumonia or congestive heart failure. Patient's EKG without any acute changes troponins have been negative x2.      I personally performed the services described in this documentation, which was scribed in my presence. The recorded information has been reviewed and is accurate.         Shelda Jakes, MD 08/12/12 1225

## 2012-08-12 NOTE — ED Notes (Signed)
Pt c/o mid center chest pain that is non radiating, has had chest pain on and off since Thursday, sob for the past two weeks, worse with exertion, pt and wife report that pt has been anemic "lately" unsure of cause, hgb at dr office this week was 8.5 per wife. Pt has taken nitro SL for the chest pain since Thursday, tums and zantac this am with "some" relief in chest pain.

## 2012-08-12 NOTE — ED Notes (Signed)
Per Dr. Deretha Emory, Dr. Ouida Sills is coming to see the Pt.

## 2012-08-12 NOTE — ED Notes (Signed)
[name] in lab reported that she was sending a slide to the pathologist at [facility] because pt had high  Number of basophils in diff. Notified Dr. Deretha Emory.

## 2012-08-13 DIAGNOSIS — R1013 Epigastric pain: Secondary | ICD-10-CM

## 2012-08-13 DIAGNOSIS — K3189 Other diseases of stomach and duodenum: Secondary | ICD-10-CM

## 2012-08-13 DIAGNOSIS — K219 Gastro-esophageal reflux disease without esophagitis: Secondary | ICD-10-CM

## 2012-08-13 DIAGNOSIS — D649 Anemia, unspecified: Secondary | ICD-10-CM

## 2012-08-13 LAB — TROPONIN I: Troponin I: <0.3 ng/mL (ref <0.30)

## 2012-08-13 LAB — PREPARE RBC (CROSSMATCH)

## 2012-08-13 LAB — CBC
Hemoglobin: 7.5 g/dL — ABNORMAL LOW (ref 13.0–17.0)
MCHC: 34.1 g/dL (ref 30.0–36.0)
Platelets: 147 10*3/uL — ABNORMAL LOW (ref 150–400)
RDW: 20.8 % — ABNORMAL HIGH (ref 11.5–15.5)

## 2012-08-13 LAB — GLUCOSE, CAPILLARY
Glucose-Capillary: 130 mg/dL — ABNORMAL HIGH (ref 70–99)
Glucose-Capillary: 139 mg/dL — ABNORMAL HIGH (ref 70–99)

## 2012-08-13 MED ORDER — ALBUTEROL SULFATE (5 MG/ML) 0.5% IN NEBU
2.5000 mg | INHALATION_SOLUTION | RESPIRATORY_TRACT | Status: DC | PRN
  Administered 2012-08-13: 2.5 mg via RESPIRATORY_TRACT
  Filled 2012-08-13 (×2): qty 0.5

## 2012-08-13 MED ORDER — FUROSEMIDE 10 MG/ML IJ SOLN
40.0000 mg | Freq: Once | INTRAMUSCULAR | Status: AC
  Administered 2012-08-13: 40 mg via INTRAVENOUS
  Filled 2012-08-13: qty 4

## 2012-08-13 NOTE — H&P (Signed)
[name], [name]             ACCOUNT NO.:  [id number]  MEDICAL RECORD NO.:  [id number]  LOCATION:  A325                          FACILITY:  [facility]  PHYSICIAN:  [name], MD       DATE OF BIRTH:  1932-03-25  DATE OF ADMISSION:  08/12/2012 DATE OF DISCHARGE:  LH                             HISTORY & PHYSICAL   CHIEF COMPLAINT:  Shortness of breath and chest tightness.  HISTORY OF PRESENT ILLNESS:  This patient is a 78 year old white male, patient of Dr. Juanetta [name], who presented to the emergency room after awakening at about 2 a.m. with shortness of breath and tightness in his chest.  His symptoms lasted about 6 hours.  He took 3 sublingual nitroglycerin without relief.  He ultimately got relief in the emergency room when treated with morphine.  States he has felt similar symptoms in the past when he has had progressive anemia.  He has evidently had chronic GI blood loss.  He has had an EGD and 3 colonoscopies this year. He reports he has had several polypectomies.  He denies any recent bright red blood in his stool.  He denies any melena.  He recently had a hemoglobin of 8.5 at Dr. Juanetta [name] office about a week ago.  On arrival to the emergency room, he was found to have a hemoglobin of 7.4 and was started on a transfusion prior to my arrival.  His initial troponins had been negative.  He has a history of coronary artery disease.  He had 4 vessel bypass in 1996.  He has been followed by Dr. Riley [name].  He has a history of atrial fibrillation but is in sinus rhythm now on amiodarone. He had previously been on Coumadin but has not been on it recently.  He denies use of NSAIDs or alcohol.  He denies any abdominal pain.  He did not experience any diaphoresis or vomiting overnight.  He is free of chest pain at the time of my exam and is breathing comfortably with an oxygen saturation in the low 90s.  PAST MEDICAL HISTORY: 1. Coronary artery disease status post 4 vessel bypass in  1996. 2. Hypertension. 3. COPD. 4. Atrial fibrillation. 5. Anemia. 6. Carotid artery disease followed by Dr. Edilia [name]. 7. Type 2 diabetes. 8. Hyperlipidemia. 9. Left ventricular dysfunction with an ejection fraction of     approximately 45-50%. 10.GERD. 11.BPH. 12.Rotator cuff repair. 13.Multiple hernia repairs. 14.Back surgery complicated by wound infection requiring prolonged     hospitalization and treatment.  MEDICATIONS: 1. Amiodarone 100 mg daily. 2. Amlodipine 5 mg daily. 3. Aspirin 81 mg daily. 4. Valium 5 mg every 6 hours as needed.  He uses about 2 a month. 5. Hydrocodone/Norco 5/325 q.6 h. p.r.n. 6. Metformin 500 mg b.i.d. 7. Metoprolol 25 mg b.i.d. 8. Multivitamin daily. 9. Sublingual nitroglycerin p.r.n. 10.Lyrica 75 mg daily. 11.Red yeast rice 600 mg daily. 12.Rapaflo 8 mg daily.  ALLERGIES:  STATINS and TAPE.  SOCIAL HISTORY:  He does not smoke cigarettes or drink alcohol.  FAMILY HISTORY:  His father had stomach cancer.  His mother had hardening of the arteries.  Two sisters have had breast cancer.  Two brothers have had  heart disease.  REVIEW OF SYSTEMS:  He denies syncope, fever, chills, change in bowel habits or difficulty voiding.  He does have chronic back pain.  PHYSICAL EXAMINATION:  VITAL SIGNS:  Temperature 98, pulse 69, respirations 22, blood pressure 129/66, oxygen saturation 94%. GENERAL:  Alert, oriented, in no acute distress. HEENT:  The eyes, nose, and pharynx are unremarkable. NECK:  Reveals no JVD or thyromegaly. LUNGS:  Reveal basilar rales. HEART:  Regular with a grade 2 systolic murmur. ABDOMEN:  Soft and nontender with no hepatosplenomegaly. EXTREMITIES:  Reveal normal pulses with no clubbing or edema. NEURO:  No focal weakness. LYMPH NODES:  No cervical or supraclavicular enlargement. SKIN:  Warm and dry.  LABORATORY DATA:  White count 5.0, hemoglobin 7.4, MCV 101, platelets 156,000, BNP 2445.  Troponin I less than 0.30.   Sodium 141, potassium 3.9, bicarb 24, BUN 21, creatinine 1.22, calcium 8.8, glucose 64, albumin 3.6, AST 18, ALT 13.  EKG reveals normal sinus rhythm with nonspecific lateral ST-segment changes.  The chest x-ray reveals subtle densities in the right lung base.  IMPRESSION/PLAN: 1. Anemia.  Agree with transfusion at a hemoglobin of 7.4.  We will     proceed slowly with this since he has a history of requiring Lasix     for volume overload related to transfusion in the past according to     his wife.  We will recheck a hemoglobin following this unit and     then we will transfuse with the 2nd unit on Sunday if needed.  We     will check a stool Hemoccult and anemia profile.  We will discuss     with Dr. Juanetta [name] regarding further GI consultation since he is well     known to Dr. Karilyn [name]. 2. Chest pain and shortness of breath likely secondary to anemia.  We     will obtain serial troponins and repeat his electrocardiogram.  It     shows no sign of acute infarct at this point and is hemodynamically     stable. 3. History of colon polyps. 4. Diabetes.  We will follow with Accu-Cheks and treat with sliding     scale NovoLog as needed. 5. History of atrial fibrillation.  He is in sinus rhythm now.     Continue amiodarone. 6. History of carotid artery disease on aspirin. 7. History of benign prostatic hypertrophy. 8. Gastroesophageal reflux disease.  This is listed on his problem     list.  He is not on PPI at present.  We will add Protonix for now.     [name], MD     ROF/MEDQ  D:  08/13/2012  T:  08/13/2012  Job:  [id number]

## 2012-08-13 NOTE — Consult Note (Signed)
Patient interviewed and examined. Rectal examination reveals formed stool in the vault and stool is guaiac-negative. His stool was also guaiac-negative and I saw him in the office in June 2013. H&H was 11.3 and 34.1. Iron studies suggest that he has plenty of iron in his body. B12 level is normal; folate level is pending as his review of peripheral smear by pathologist. His MCV has increased from normal to 101.9 yesterday. He has noted intermittent epigastric pain and burping since he stopped pantoprazole he is back on it now and should continue. This time there is no evidence that his anemia is secondary to GI blood loss which he experienced while he was on warfarin. Therefore I am concerned that he may have bone marrow disorder. Recommendations; Continue pantoprazole at 40 mg by mouth every morning. Hematology consultation with Dr. Mariel Sleet.

## 2012-08-13 NOTE — Consult Note (Signed)
[name], [name]             ACCOUNT NO.:  [id number]  MEDICAL RECORD NO.:  [id number]  LOCATION:  A325                          FACILITY:  [facility]  PHYSICIAN:  Lionel December, M.D.    DATE OF BIRTH:  10-Sep-1932  DATE OF CONSULTATION:  08/13/2012 DATE OF DISCHARGE:                                CONSULTATION   REASON FOR CONSULTATION:  Anemia.  HISTORY OF PRESENT ILLNESS:  He is a 78 year old Caucasian male with multiple medical problems including complicated GI history which is reviewed under past medical history, has been experiencing exertional dyspnea for the last 3 weeks.  He came to emergency room yesterday with chest tightness and shortness of breath.  Nitroglycerin did not provide him any relief.  In emergency room, he was given morphine with resolution of his pain.  In emergency room, he was noted to be anemic with H and H of 7.4 and 21.7.  The patient was given 1 unit of PRBCs and hemoglobin this morning was 7.5.  He is in the process of getting 2 units today.  The patient denies melena or rectal bleeding.  His bowels move daily. The only time he has diarrhea is if he eats spicy foods.  Over the last 3-4 weeks, he has noted stinging and burning pain in the epigastric region.  He has been burping quite a bit, and has had sporadic heartburn.  His wife states he stopped pantoprazole because of the cost of medication.  He has taken Tums sporadically which has helped.  He denies nausea or vomiting.  Occasionally, he has difficulty initiating the swallow.  He states he has had this problem since a prolonged hospitalization in 2007.  He takes low-dose aspirin, but does not take any NSAIDs.  He has lost 20 pounds in the last 90 days, since he has changed his eating habits.  REVIEW OF SYSTEMS:  Positive for back pain, as well as pain in his lower extremities due to neuropathy.  HOME MEDICATIONS: 1. Acetaminophen 500 mg p.o. q.6 p.r.n. 2. Amiodarone 100 mg p.o. daily. 3.  Amlodipine 5 mg p.o. daily. 4. Aspirin 81 mg p.o. daily. 5. Diazepam 5 mg p.o. q.6 p.r.n. for muscle spasms. 6. Norco 5/325, 1 tablet p.o. q.6 p.r.n. which he takes occasionally. 7. Metformin 500 mg p.o. b.i.d. 8. Metoprolol tartrate 25 mg p.o. b.i.d. 9. MVI 1 p.o. daily. 10.Nitro stat 0.4 mg sublingual p.r.n. 11.Pregabalin 75 mg p.o. daily. 12.Red yeast rice 600 mg p.o. daily. 13.Silodosin 8 mg p.o. daily.  CURRENT MEDICATIONS: 1. Pantoprazole 40 mg IV q.a.m. 2. Tamsulosin 0.4 mg p.o. daily. 3. Aspartate insulin via sliding scale. 4. Amiodarone, amlodipine, aspirin, metformin, metoprolol as above.  PAST MEDICAL HISTORY:  His GI history is as follows.  He has a history of colonic polyps.  He did not return for elective colonoscopy as recommended.  In January 2013, he was admitted to [facility] with burgundy stools.  This was in setting of supratherapeutic INR.  He underwent EGD and a colonoscopy.  EGD revealed erosive esophagitis and erosive gastritis.  He was subsequently proven to have H. pylori gastritis and treated a few months later.  Colonoscopy revealed multiple polyps, but these  were not removed because of INR in undesirable range.  The patient returned for elective colonoscopy with polypectomy on January 14, 2012, and a multiple polyps were removed, but colon was not cleaned of all the polyps.  H and H was checked at that time.  It was down to 8 and 26.1.  He was given 2 units of PRBCs.  He had another colonoscopy on April 20, 2012, and rest of the polyps were removed.  All of this polyps were adenomas.  He had H and H at that time, and his hemoglobin was 11.3 and hematocrit was 34.1.  He was seen in the office on June 2013, when his stool was guaiac negative.  He has coronary artery disease, and had 4-vessel CABG in 1996.  He has diabetes mellitus.  History of atrial fibrillation.  He had been on warfarin which was discontinued because of recurrent GI  bleed.  Hypertension.  Hyperlipidemia.  COPD.  Peripheral neuropathy.  Osteoarthrosis.  BPH.  He had right knee arthrotomy in 1960.  He had right shoulder surgery for rotator cuff tear in 1990.  He had left inguinal herniorrhaphy in 2000. He had lumbar spine fusion in 2007, and he developed infection.  He had multisystem failure and was in the hospital for over 70 days and then took 3 months in a nursing home.  He had a PEG tube placed, which was subsequently taken out by me.  He was at [facility] for his initial surgery.  He had tonsillectomy as a child.  He had cholecystectomy in November 2011, with repair of 5 ventral hernias.  ALLERGIES:  He is allergic to STATINS causing muscle pain.  FAMILY HISTORY:  Noncontributory.  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  He smoked about 2 packs a day for several years, but quit 21 years ago and he does not drink alcohol.  PHYSICAL EXAMINATION:  VITAL SIGNS:  Admission weight 198 pounds and 14 ounces.  He is 71.5 inches tall.  Pulse 80 per minute and appears to be regular, blood pressure 119/48, respiratory rate is 20, and temp is 98.2. HEENT:  Conjunctivae are pale.  Sclerae are nonicteric.  Oropharyngeal mucosa is normal.  He has upper and lower dentures in place. NECK:  No neck masses or thyromegaly noted.  He has small submucosal nodule left neck, possibly sebaceous cyst or a lipoma CARDIAC:  Regular rhythm.  Normal S1 and S2.  He has faint systolic ejection murmur best heard at LLSB, also at aortic area.  He has few dry crackles at left base. ABDOMEN:  His abdomen is full.  Bowel sounds are normal.  On palpation, soft abdomen with mild midepigastric tenderness.  He has small submucosal lipoma in left mid abdomen.  No organomegaly or masses noted. RECTAL:  Formed stool in the vault.  Stool is guaiac negative. EXTREMITIES:  No peripheral edema or clubbing noted.  LABORATORY DATA FROM  ADMISSION:  WBC 5.0, H and H 7.4 and 21.7, MCV 101.9, platelet count 156 K.  Metabolic 7 within normal limits except glucose is 144.  LFTs within normal limits.  Troponin level less than 0.030.  Serum iron 160, TIBC 279, and saturation is 57%.  Serum ferritin is 278. B12 level is 455, and folate level is pending.  ASSESSMENT:  The patient is a 78 year old Caucasian male, who was admitted with progressive weakness, exertional dyspnea and chest pain and has ruled out for MI. On presentation, he  was noted to be anemic with hemoglobin of 7.4 g. Hemoglobin over 3 months ago was 11.3.  He has history of overt GI bleed and has undergone EGD and colonoscopies as above.  However, his stool is guaiac negative today as it was in June 2013.  Furthermore, his iron stores are plentiful.  It does not appear that his anemia is due to acute or chronic GI blood loss.  Unless his folate level is low, I suspect he may have marrow problem or impaired red cell synthesis.  Dr. Juanetta Gosling as requested for full review by the pathologist which is pending.  He has dyspeptic symptoms which are most likely due to gastroesophageal reflux disease, and he needs to go back on his PPI and stay on it continuously.  RECOMMENDATIONS:  Continue pantoprazole at 40 mg p.o. q.a.m., indefinitely.  Hematology consultation with Dr. Glenford Peers, unless folate level is low.  At this point, I do not see any indication for repeat endoscopic evaluation or small-bowel given capsule study.  We appreciate the opportunity to participate in the care of this gentleman.          ______________________________ Lionel December, M.D.     NR/MEDQ  D:  08/13/2012  T:  08/13/2012  Job:  [id number]  cc:   Ramon Dredge L. Juanetta Gosling, M.D. Fax: [phone]

## 2012-08-13 NOTE — Progress Notes (Signed)
Subjective: He was admitted yesterday with chest tightness and was found to be more anemic. He had a hematoma lobe about 8.5 in my office approximately a week ago and was being set up to do a capsule study but had more trouble before that can be done. He says he feels okay now. He still feels weak. He had increased shortness of breath which he associates with being anemic. His troponin level has been negative.  Objective: Vital signs in last 24 hours: Temp:  [97.8 F (36.6 C)-98.1 F (36.7 C)] 98 F (36.7 C) (11/17 0727) Pulse Rate:  [64-70] 69  (11/17 0727) Resp:  [18-28] 22  (11/17 0727) BP: (108-144)/(48-83) 124/56 mmHg (11/17 0727) SpO2:  [82 %-100 %] 100 % (11/17 0627) FiO2 (%):  [100 %] 100 % (11/17 0443) Weight:  [90.2 kg (198 lb 13.7 oz)] 90.2 kg (198 lb 13.7 oz) (11/16 1518) Weight change:  Last BM Date: 08/11/12  Intake/Output from previous day: 11/16 0701 - 11/17 0700 In: 3055.5 [P.O.:360; I.V.:2244; Blood:437.5; IV Piggyback:14] Out: 725 [Urine:725]  PHYSICAL EXAM General appearance: alert, cooperative and mild distress Resp: clear to auscultation bilaterally Cardio: regular rate and rhythm, S1, S2 normal, no murmur, click, rub or gallop GI: soft, non-tender; bowel sounds normal; no masses,  no organomegaly Extremities: extremities normal, atraumatic, no cyanosis or edema  Lab Results:    Basic Metabolic Panel:  Basename 08/12/12 0733  NA 141  K 3.9  CL 106  CO2 24  GLUCOSE 144*  BUN 21  CREATININE 1.22  CALCIUM 8.8  MG --  PHOS --   Liver Function Tests:  Basename 08/12/12 0829  AST 18  ALT 13  ALKPHOS 61  BILITOT 0.4  PROT 6.6  ALBUMIN 3.6   No results found for this basename: LIPASE:2,AMYLASE:2 in the last 72 hours No results found for this basename: AMMONIA:2 in the last 72 hours CBC:  Basename 08/13/12 0152 08/12/12 0733  WBC 5.5 5.0  NEUTROABS -- 2.4  HGB 7.5* 7.4*  HCT 22.0* 21.7*  MCV 98.2 101.9*  PLT 147* 156   Cardiac  Enzymes:  Basename 08/13/12 0045 08/12/12 1949 08/12/12 1630  CKTOTAL -- -- --  CKMB -- -- --  CKMBINDEX -- -- --  TROPONINI <0.30 <0.30 <0.30   BNP:  Basename 08/12/12 0829  PROBNP 2445.0*   D-Dimer: No results found for this basename: DDIMER:2 in the last 72 hours CBG:  Basename 08/13/12 0741 08/12/12 2046 08/12/12 1635  GLUCAP 130* 157* 136*   Hemoglobin A1C: No results found for this basename: HGBA1C in the last 72 hours Fasting Lipid Panel: No results found for this basename: CHOL,HDL,LDLCALC,TRIG,CHOLHDL,LDLDIRECT in the last 72 hours Thyroid Function Tests: No results found for this basename: TSH,T4TOTAL,FREET4,T3FREE,THYROIDAB in the last 72 hours Anemia Panel:  Basename 08/12/12 1324  VITAMINB12 455  FOLATE --  FERRITIN 278  TIBC 279  IRON 160*  RETICCTPCT 0.8   Coagulation: No results found for this basename: LABPROT:2,INR:2 in the last 72 hours Urine Drug Screen: Drugs of Abuse  No results found for this basename: labopia, cocainscrnur, labbenz, amphetmu, thcu, labbarb    Alcohol Level: No results found for this basename: ETH:2 in the last 72 hours Urinalysis: No results found for this basename: COLORURINE:2,APPERANCEUR:2,LABSPEC:2,PHURINE:2,GLUCOSEU:2,HGBUR:2,BILIRUBINUR:2,KETONESUR:2,PROTEINUR:2,UROBILINOGEN:2,NITRITE:2,LEUKOCYTESUR:2 in the last 72 hours Misc. Labs:  ABGS No results found for this basename: PHART,PCO2,PO2ART,TCO2,HCO3 in the last 72 hours CULTURES Recent Results (from the past 240 hour(s))  MRSA PCR SCREENING     Status: Normal   Collection Time  08/12/12  4:30 PM      Component Value Range Status Comment   MRSA by PCR NEGATIVE  NEGATIVE Final    Studies/Results: Dg Chest 2 View  08/12/2012  *RADIOLOGY REPORT*  Clinical Data: Chest pain.  CHEST - 2 VIEW  Comparison: 01/03/2012 and 10/06/2011  Findings: Two views of the chest demonstrate previous CABG procedure.  There may be mild volume loss at the right lung base. There are  subtle densities at the right lung base that appear new from the prior examinations.  Heart size is stable.  Left lung appears clear.  No definite pleural effusions.  IMPRESSION: Subtle densities at the right lung base.  The differential diagnosis would include atelectasis versus subtle airspace disease.   Original Report Authenticated By: Richarda Overlie, M.D.     Medications:  Prior to Admission:  Prescriptions prior to admission  Medication Sig Dispense Refill  . acetaminophen (TYLENOL) 500 MG tablet Take 1 tablet (500 mg total) by mouth every 6 (six) hours as needed. Pain  30 tablet    . amiodarone (PACERONE) 200 MG tablet Take 0.5 tablets (100 mg total) by mouth daily.  30 tablet  5  . amLODipine (NORVASC) 5 MG tablet Take 5 mg by mouth daily.      Marland Kitchen aspirin 81 MG tablet Take 81 mg by mouth daily.      . diazepam (VALIUM) 5 MG tablet Take 5 mg by mouth every 6 (six) hours as needed. Muscles Spasms      . HYDROcodone-acetaminophen (NORCO) 5-325 MG per tablet Take 1 tablet by mouth every 6 (six) hours as needed. Pain      . metFORMIN (GLUCOPHAGE) 500 MG tablet Take 500 mg by mouth 2 (two) times daily with a meal.      . metoprolol tartrate (LOPRESSOR) 25 MG tablet Take 25 mg by mouth 2 (two) times daily.        . Multiple Vitamin (MULTIVITAMIN) tablet Take 1 tablet by mouth daily.        . nitroGLYCERIN (NITROSTAT) 0.4 MG SL tablet Place 1 tablet (0.4 mg total) under the tongue every 5 (five) minutes x 3 doses as needed for chest pain (up to 3 doses).  25 tablet  3  . pregabalin (LYRICA) 75 MG capsule Take 75 mg by mouth once.      . Red Yeast Rice 600 MG TABS Take 1 tablet by mouth daily.        . silodosin (RAPAFLO) 8 MG CAPS capsule Take 8 mg by mouth daily with breakfast.         Scheduled:   . [COMPLETED] albuterol      . amiodarone  100 mg Oral Daily  . amLODipine  5 mg Oral Daily  . aspirin  81 mg Oral Daily  . [COMPLETED] furosemide  40 mg Intravenous Once  . furosemide  40 mg  Intravenous Once  . insulin aspart  0-5 Units Subcutaneous QHS  . insulin aspart  0-9 Units Subcutaneous TID WC  . metFORMIN  500 mg Oral BID WC  . metoprolol tartrate  25 mg Oral BID  . multivitamin with minerals  1 tablet Oral Daily  . pantoprazole (PROTONIX) IV  40 mg Intravenous Daily  . [COMPLETED] pregabalin  75 mg Oral Once  . Tamsulosin HCl  0.4 mg Oral Daily   Continuous:   . sodium chloride Stopped (08/13/12 1610)  . [DISCONTINUED] sodium chloride 100 mL/hr at 08/12/12 0847   RUE:[id number], acetaminophen, albuterol, alum &  mag hydroxide-simeth, diazepam, HYDROcodone-acetaminophen, nitroGLYCERIN, ondansetron (ZOFRAN) IV, ondansetron  Assesment: He has GI bleeding. He has acute blood loss anemia. He has coronary artery occlusive disease. I think he may have been volume overloaded by the blood transfusion. Since he had very little improvement with one unit of packed red blood cells he is going to get 2 more and then recheck. I will give him Lasix in between his blood. Active Problems:  * No active hospital problems. *     Plan: Blood transfusion as above. He will have a GI consultation    LOS: 1 day   [name] L 08/13/2012, 8:56 AM

## 2012-08-13 NOTE — Progress Notes (Signed)
After pt c/o SOB and lab result showing only a slight increase in hgb from yesterday I paged Dr. Ouida Sills. Dr. Ouida Sills gave orders for Lasix and to transfuse 1 unit PRBC. Pt blood transfusion has begun. No s/s of reaction. Will continue to monitor.

## 2012-08-13 NOTE — Progress Notes (Addendum)
Pt called during night for SOB, SAT's 87 on room air, placed pt on 3lpm/[place] by nurse SAT's only 91-92. PT given prn albuterol neb and assesment done. PT appears to be anemic by lab values and slight fluid over loaded upon entry to hospital. Check BNP 2000+ on arrival to er. He now is 2000 + by volume. No chf

## 2012-08-14 DIAGNOSIS — R599 Enlarged lymph nodes, unspecified: Secondary | ICD-10-CM

## 2012-08-14 DIAGNOSIS — D649 Anemia, unspecified: Principal | ICD-10-CM

## 2012-08-14 LAB — CBC WITH DIFFERENTIAL/PLATELET
Band Neutrophils: 0 % (ref 0–10)
Basophils Absolute: 0.3 10*3/uL — ABNORMAL HIGH (ref 0.0–0.1)
Blasts: 0 %
Hemoglobin: 8.8 g/dL — ABNORMAL LOW (ref 13.0–17.0)
Lymphocytes Relative: 18 % (ref 12–46)
MCV: 96.4 fL (ref 78.0–100.0)
Metamyelocytes Relative: 0 %
Myelocytes: 0 %
Neutro Abs: 3.3 10*3/uL (ref 1.7–7.7)
Platelets: 137 10*3/uL — ABNORMAL LOW (ref 150–400)
Promyelocytes Absolute: 0 %
RBC: 2.77 MIL/uL — ABNORMAL LOW (ref 4.22–5.81)
WBC: 5.2 10*3/uL (ref 4.0–10.5)

## 2012-08-14 LAB — BASIC METABOLIC PANEL
CO2: 26 mEq/L (ref 19–32)
Calcium: 8.6 mg/dL (ref 8.4–10.5)
Glucose, Bld: 111 mg/dL — ABNORMAL HIGH (ref 70–99)
Potassium: 3.6 mEq/L (ref 3.5–5.1)
Sodium: 141 mEq/L (ref 135–145)

## 2012-08-14 LAB — TYPE AND SCREEN
ABO/RH(D): A NEG
Antibody Screen: NEGATIVE
Unit division: 0

## 2012-08-14 LAB — OCCULT BLOOD X 1 CARD TO LAB, STOOL: Fecal Occult Bld: NEGATIVE

## 2012-08-14 LAB — GLUCOSE, CAPILLARY
Glucose-Capillary: 118 mg/dL — ABNORMAL HIGH (ref 70–99)
Glucose-Capillary: 145 mg/dL — ABNORMAL HIGH (ref 70–99)

## 2012-08-14 LAB — PATHOLOGIST SMEAR REVIEW

## 2012-08-14 MED ORDER — PANTOPRAZOLE SODIUM 40 MG PO TBEC
40.0000 mg | DELAYED_RELEASE_TABLET | Freq: Every day | ORAL | Status: DC
  Administered 2012-08-15: 40 mg via ORAL
  Filled 2012-08-14: qty 1

## 2012-08-14 NOTE — Consult Note (Signed)
[facility] Consultation Oncology  Name: [name]      MRN: [id number]    Location: Z308/M578-46  Date: 08/14/2012 Time:11:25 AM   REFERRING PHYSICIAN:  Kari Baars, MD  REASON FOR CONSULT:  Anemia   DIAGNOSIS:  Anemia with occasional thrombocytopenia  HISTORY OF PRESENT ILLNESS:   [name] is a 78 year old Caucasian male with a past medical history significant for CAD, PVD, carotid artery stenosis, AVM of colon, angina who was admitted to the [facility] for an episode of angina in the setting of anemia.    [name] reports a history of blood transfusions, the first being in April 2013.  He had an extensive work-up approximately 6 months ago by GI for bleeding.  He presently denies any blood in stool or black tarry stool.  He denies any hematemesis.   He reports that he has had an anemia ever since a bacterial infection in 2007.  I do not have those records presently.  On chart review he has an occasional thrombocytopenia dating back to 2009.  He does have instances of basophilia as well.   He denies any completes.  He denies any B symptoms, but he does admit to a 20 lbs weight loss in 3 months (but he is trying to loose weight).  He denies any headaches, dizziness, double vision, fevers, chills, night sweats, nausea, vomiting, diarrhea, constipation, abdominal pain, chest pain, heart palpitations, blood in stool, black tarry stool, hematuria, urinary pain, frequency, burning, spontaneous bleeding.      PAST MEDICAL HISTORY:   Past Medical History  Diagnosis Date  . Hypertension   . COPD (chronic obstructive pulmonary disease)   . CAD (coronary artery disease)     s/p 4V CABG '96, Myoview '10 w/o ischemia  . Arthritis   . Atrial fibrillation     coumadin stopped 12/2011 2/2 anemia  . Dysphagia   . Anemia     requiring blood transfusions  . Carotid stenosis     Dr Waverly Ferrari follows.   . Diabetes mellitus, type 2     A1C 6.7% 12/2011  .  HLD (hyperlipidemia)     LDL 65 12/2011, intolerant to statins  . Hematuria     felt 2/2 foley insertion  . LV dysfunction     EF 45-50% by echo 2007  . Melena     EGD & Colonoscopy 09/2011 revealed gastritis, erosions, scars, diverticula, 8 colon polyps (largest 1.6cm, not removed 2/2 anticoagulation), small AVM in transverse colon  . History of tobacco abuse     quit 1992  . Anxiety   . Shortness of breath     exertion  . GERD (gastroesophageal reflux disease)   . Myocardial infarction     ALLERGIES: Allergies  Allergen Reactions  . Statins Other (See Comments)    Causes legs to hurt.   . Tape     Causes skin to peel off.       MEDICATIONS: I have reviewed the patient's current medications.     PAST SURGICAL HISTORY Past Surgical History  Procedure Date  . Knee surgery 1960    Right knee cartilage  . Rotator cuff repair 1990    right   . Inguinal hernia repair 2000 and 2010    left  . Cholecystectomy 05/2010    Lap chole with biologic mesh repair/reinforcement by  Dr Zachery Dakins  . Back surgery 02/2006    ruptured disk,  post op diskitis infection requiring prolonged  hospital stay and  surgical I & D  . Tonsillectomy   . Esophagogastroduodenoscopy 10/08/2011    Procedure: ESOPHAGOGASTRODUODENOSCOPY (EGD);  Surgeon: Malissa Hippo, MD;  [facility]: [facility];  Service: Endoscopy;  Laterality: N/A;  . Colonoscopy 10/08/2011    Procedure: COLONOSCOPY;  Surgeon: Malissa Hippo, MD;  Location: [facility] END[facility] SU[facility];  Service: Endoscopy;  Laterality: N/A;  . Cataract extraction, bilateral   . Peg placement 07/2006    placed due to prolonged infection, poor po intake, malnutrition during several week hospital stay resulting from ruptured disc that became infected following surgery  . Colonoscopy 01/14/2012    Procedure: COLONOSCOPY;  Surgeon: Malissa Hippo, MD;  Location: [facility];  Service: Endoscopy;  Laterality: N/A;  945  . Coronary artery bypass graft 1996    4 by-pass    . Ventral hernia repair   . Colonoscopy 04/20/2012    Procedure: COLONOSCOPY;  Surgeon: Malissa Hippo, MD;  Location: [facility];  Service: Endoscopy;  Laterality: N/A;  1030  . Pr vein bypass graft,aorto-fem-pop 1996  . Spine surgery   . Joint replacement 1960    Right Knee    FAMILY HISTORY: Family History  Problem Relation Age of Onset  . Heart disease Sister   . Cancer Sister   . Hyperlipidemia Sister   . Hypertension Sister   . Heart disease Brother     Heart Diseast before age 5  . Coronary artery disease Mother   . Cancer Father     stomach  . Anesthesia problems Neg Hx   . Hypotension Neg Hx   . Malignant hyperthermia Neg Hx   . Pseudochol deficiency Neg Hx    Niece passed from Multiple Myeloma.  Multiple family members with "stomach cancer," "breast cancer," "bone cancer," "colon cancer."   SOCIAL HISTORY:  reports that he quit smoking about 21 years ago. His smoking use included Cigarettes. He has a 80 pack-year smoking history. He has never used smokeless tobacco. He reports that he does not drink alcohol or use illicit drugs. He lives in [place], [place] and has been married to his wife for 58 years.  He lives with his wife at home.   PERFORMANCE STATUS: The patient's performance status is 1 - Symptomatic but completely ambulatory  PHYSICAL EXAM: Most Recent Vital Signs: Blood pressure 131/52, pulse 65, temperature 98.7 F (37.1 C), temperature source Oral, resp. rate 20, height 5' 11.5" (1.816 m), weight 203 lb 11.3 oz (92.4 kg), SpO2 97.00%. General appearance: alert, cooperative, appears stated age, no distress and mildly obese Head: Normocephalic, without obvious abnormality, atraumatic Eyes: negative findings: lids and lashes normal, conjunctivae and sclerae normal and pupils equal, round, reactive to light and accomodation Neck: left superior, posterior cervical chain lymph node measuring 1 cm clinically.  Back: symmetric, no curvature. ROM normal. No  CVA tenderness., multiple sebaceous cysts Lungs: clear to auscultation bilaterally and normal percussion bilaterally Heart: regular rate and rhythm, S1, S2 normal, no S3 or S4 and soft systolic, musical murmur heard best at LSB 5th ICS. Abdomen: soft, non-tender; bowel sounds normal; no masses,  no organomegaly. LUQ tenderness on deep inspiration.  Extremities: extremities normal, atraumatic, no cyanosis or edema and pneumatic compression stockings in place Skin: Skin color, texture, turgor normal. No rashes or lesions or multiple sebaceous cysts Lymph nodes: Cervical adenopathy: as described in neck portion of PE Neurologic: Grossly normal  LABORATORY DATA:  Results for orders placed during the hospital encounter of 08/12/12 (from  the past 48 hour(s))  VITAMIN B12     Status: Normal   Collection Time   08/12/12  1:24 PM      Component Value Range Comment   Vitamin B-12 455  211 - 911 pg/mL   IRON AND TIBC     Status: Abnormal   Collection Time   08/12/12  1:24 PM      Component Value Range Comment   Iron 160 (*) 42 - 135 ug/dL    TIBC 161  096 - 045 ug/dL    Saturation Ratios 57 (*) 20 - 55 %    UIBC 119 (*) 125 - 400 ug/dL   FERRITIN     Status: Normal   Collection Time   08/12/12  1:24 PM      Component Value Range Comment   Ferritin 278  22 - 322 ng/mL   RETICULOCYTES     Status: Abnormal   Collection Time   08/12/12  1:24 PM      Component Value Range Comment   Retic Ct Pct 0.8  0.4 - 3.1 %    RBC. 2.13 (*) 4.22 - 5.81 MIL/uL    Retic Count, Manual 17.0 (*) 19.0 - 186.0 K/uL   TROPONIN I     Status: Normal   Collection Time   08/12/12  4:30 PM      Component Value Range Comment   Troponin I <0.30  <0.30 ng/mL   MRSA PCR SCREENING     Status: Normal   Collection Time   08/12/12  4:30 PM      Component Value Range Comment   MRSA by PCR NEGATIVE  NEGATIVE   GLUCOSE, CAPILLARY     Status: Abnormal   Collection Time   08/12/12  4:35 PM      Component Value Range  Comment   Glucose-Capillary 136 (*) 70 - 99 mg/dL    Comment 1 Notify RN      Comment 2 Documented in Chart     TROPONIN I     Status: Normal   Collection Time   08/12/12  7:49 PM      Component Value Range Comment   Troponin I <0.30  <0.30 ng/mL   GLUCOSE, CAPILLARY     Status: Abnormal   Collection Time   08/12/12  8:46 PM      Component Value Range Comment   Glucose-Capillary 157 (*) 70 - 99 mg/dL    Comment 1 Notify RN     TROPONIN I     Status: Normal   Collection Time   08/13/12 12:45 AM      Component Value Range Comment   Troponin I <0.30  <0.30 ng/mL   CBC     Status: Abnormal   Collection Time   08/13/12  1:52 AM      Component Value Range Comment   WBC 5.5  4.0 - 10.5 K/uL    RBC 2.24 (*) 4.22 - 5.81 MIL/uL    Hemoglobin 7.5 (*) 13.0 - 17.0 g/dL    HCT 40.9 (*) 81.1 - 52.0 %    MCV 98.2  78.0 - 100.0 fL    MCH 33.5  26.0 - 34.0 pg    MCHC 34.1  30.0 - 36.0 g/dL    RDW 91.4 (*) 78.2 - 15.5 %    Platelets 147 (*) 150 - 400 K/uL   PREPARE RBC (CROSSMATCH)     Status: Normal   Collection Time   08/13/12  5:30  AM      Component Value Range Comment   Order Confirmation ORDER PROCESSED BY BLOOD BANK     GLUCOSE, CAPILLARY     Status: Abnormal   Collection Time   08/13/12  7:41 AM      Component Value Range Comment   Glucose-Capillary 130 (*) 70 - 99 mg/dL   PREPARE RBC (CROSSMATCH)     Status: Normal   Collection Time   08/13/12  9:00 AM      Component Value Range Comment   Order Confirmation ORDER PROCESSED BY BLOOD BANK     GLUCOSE, CAPILLARY     Status: Abnormal   Collection Time   08/13/12 11:45 AM      Component Value Range Comment   Glucose-Capillary 165 (*) 70 - 99 mg/dL    Comment 1 Notify RN      Comment 2 Documented in Chart     GLUCOSE, CAPILLARY     Status: Abnormal   Collection Time   08/13/12  4:51 PM      Component Value Range Comment   Glucose-Capillary 135 (*) 70 - 99 mg/dL    Comment 1 Notify RN      Comment 2 Documented in Chart       GLUCOSE, CAPILLARY     Status: Abnormal   Collection Time   08/13/12  8:28 PM      Component Value Range Comment   Glucose-Capillary 139 (*) 70 - 99 mg/dL   BASIC METABOLIC PANEL     Status: Abnormal   Collection Time   08/14/12  5:01 AM      Component Value Range Comment   Sodium 141  135 - 145 mEq/L    Potassium 3.6  3.5 - 5.1 mEq/L    Chloride 104  96 - 112 mEq/L    CO2 26  19 - 32 mEq/L    Glucose, Bld 111 (*) 70 - 99 mg/dL    BUN 24 (*) 6 - 23 mg/dL    Creatinine, Ser 1.61 (*) 0.50 - 1.35 mg/dL    Calcium 8.6  8.4 - 09.6 mg/dL    GFR calc non Af Amer 47 (*) >90 mL/min    GFR calc Af Amer 54 (*) >90 mL/min   CBC WITH DIFFERENTIAL     Status: Abnormal   Collection Time   08/14/12  5:01 AM      Component Value Range Comment   WBC 5.2  4.0 - 10.5 K/uL    RBC 2.77 (*) 4.22 - 5.81 MIL/uL    Hemoglobin 8.8 (*) 13.0 - 17.0 g/dL    HCT 04.5 (*) 40.9 - 52.0 %    MCV 96.4  78.0 - 100.0 fL    MCH 31.8  26.0 - 34.0 pg    MCHC 33.0  30.0 - 36.0 g/dL    RDW 81.1 (*) 91.4 - 15.5 %    Platelets 137 (*) 150 - 400 K/uL    Neutrophils Relative 63  43 - 77 % CORRECTED ON 11/18 AT 0854: PREVIOUSLY REPORTED AS 62   Lymphocytes Relative 18  12 - 46 % CORRECTED ON 11/18 AT 0854: PREVIOUSLY REPORTED AS 20   Monocytes Relative 2 (*) 3 - 12 % CORRECTED ON 11/18 AT 0854: PREVIOUSLY REPORTED AS 3   Eosinophils Relative 11 (*) 0 - 5 % CORRECTED ON 11/18 AT 0854: PREVIOUSLY REPORTED AS 14   Basophils Relative 6 (*) 0 - 1 % CORRECTED ON 11/18 AT 0854: PREVIOUSLY REPORTED  AS 1   Band Neutrophils 0  0 - 10 %    Metamyelocytes Relative 0      Myelocytes 0      Promyelocytes Absolute 0      Blasts 0      nRBC 0  0 /100 WBC    Smear Review LARGE PLATELETS PRESENT      Neutro Abs 3.3  1.7 - 7.7 K/uL    Lymphs Abs 0.9  0.7 - 4.0 K/uL    Monocytes Absolute 0.1  0.1 - 1.0 K/uL    Eosinophils Absolute 0.6  0.0 - 0.7 K/uL    Basophils Absolute 0.3 (*) 0.0 - 0.1 K/uL   GLUCOSE, CAPILLARY     Status:  Abnormal   Collection Time   08/14/12  7:49 AM      Component Value Range Comment   Glucose-Capillary 118 (*) 70 - 99 mg/dL       RADIOGRAPHY: 04/54/0981  *RADIOLOGY REPORT*  Clinical Data: Soft tissue knot left-side of neck present 2 years  appears to have enlarged. Denies pain.  CT NECK WITH CONTRAST  Technique: Multidetector CT imaging of the neck was performed with  intravenous contrast.  Contrast: OMNIPAQUE IOHEXOL 300 MG/ML SOLN  Comparison: 01/18/2012 and 11/06/2010.  Findings: The subcutaneous nodule at the posterior lateral margin  of the left sternocleidomastoid muscle where the patient has a  palpable abnormality has increased minimally in size since the most  remote exam of 11/06/2010 now measuring 9.3 x 8.1 x 9.6 mm versus  prior 9.0 x 8.1 x 8.2 mm. Etiology/significance indeterminate.  This does not have a cleft like appearance as seen with a simple  lymph node. This may represent a benign subcutaneous lesion which  has become irritated/inflamed causing patient's discomfort. Slow  growing tumor not excluded.  Present exam is motion degraded and not performed as a CT angiogram  however, there appears to have been progression of diffuse  atherosclerotic type changes most notable involving the right  internal carotid artery where there is severe narrowing with a  string like appearance.  Stable appearance of lung parenchymal changes.  Stable heterogeneous appearance of the thyroid gland more notable  on the left.  Cervical spondylotic changes most notable C3-4.  Underfilling of the left piriform sinus without definitive mass  identified. Slightly prominent lingual tonsillar tissue fills the  vallecula.  IMPRESSION:  The subcutaneous nodule at the posterior lateral margin of the left  sternocleidomastoid muscle where the patient has a palpable  abnormality has increased minimally in size since the most remote  exam of 11/06/2010 now measuring 9.3 x 8.1 x 9.6  mm versus prior  9.0 x 8.1 x 8.2 mm. Etiology/significance indeterminate. This does  not have a cleft like appearance as seen with a simple lymph node.  This may represent a benign subcutaneous lesion which has become  irritated/inflamed causing patient's discomfort. Slow growing  tumor not excluded.  Right nternal carotid artery severe narrowing with a string like  appearance.  Stable heterogeneous appearance of the thyroid gland more notable  on the left.  Underfilling of the left piriform sinus without definitive mass  identified.  Original Report Authenticated By: Lacy Duverney, M.D.    PATHOLOGY:  None  ASSESSMENT:  1. Normocytic anemia 2. Recent history of GI bleed 3. Renal insufficiency 4. Occasional thrombocytopenia dating back to 2009 5. Left, cervical posterior chain lymph node mesuring 1 cm.  6. AVM of colon without hemorrhage 7. Multiple sebaceous cysts on  neck and back.  8. CAD 9. PVD 10. Carotid artery stenosis 11. Dyslipidemia 12. Angina  PLAN:  1. Chart reviewed 2. I personally reviewed and went over laboratory results with the patient. 3. Lab work today: Folate, ESR, LDH, Acute hepatitis panel, HIV 4. CT CAP without contrast to evaluate for lymphadenopathy, evaluate for lymphoma. 5. Consult general surgery (patient requests Dr. Malvin Johns)  for left, posterior cervical chain lymph node excisional biopsy.   6. Will continue to follow as an inpatient.   All questions were answered. The patient knows to call the clinic with any problems, questions or concerns. We can certainly see the patient much sooner if necessary.  The patient and plan discussed with Glenford Peers, MD and he is in agreement with the aforementioned.  [name]

## 2012-08-14 NOTE — Progress Notes (Signed)
Subjective: He says he feels better. He has no new complaints. The GI consult is noted and appreciated. Of interest is the fact that he has lymph nodes in his neck and the one on the left has increased in size although very slowly.  Objective: Vital signs in last 24 hours: Temp:  [97.5 F (36.4 C)-98.7 F (37.1 C)] 98.7 F (37.1 C) (11/18 0441) Pulse Rate:  [62-80] 65  (11/18 0441) Resp:  [20-22] 20  (11/18 0441) BP: (94-131)/(37-66) 131/52 mmHg (11/18 0441) SpO2:  [97 %-99 %] 97 % (11/18 0441) Weight:  [92.4 kg (203 lb 11.3 oz)] 92.4 kg (203 lb 11.3 oz) (11/18 0441) Weight change: 2.2 kg (4 lb 13.6 oz) Last BM Date: 08/11/12  Intake/Output from previous day: 11/17 0701 - 11/18 0700 In: 1110 [P.O.:860; I.V.:250] Out: 1700 [Urine:1700]  PHYSICAL EXAM General appearance: alert, cooperative and no distress Resp: clear to auscultation bilaterally Cardio: regular rate and rhythm, S1, S2 normal, no murmur, click, rub or gallop GI: soft, non-tender; bowel sounds normal; no masses,  no organomegaly Extremities: extremities normal, atraumatic, no cyanosis or edema  Lab Results:    Basic Metabolic Panel:  Basename 08/14/12 0501 08/12/12 0733  NA 141 141  K 3.6 3.9  CL 104 106  CO2 26 24  GLUCOSE 111* 144*  BUN 24* 21  CREATININE 1.40* 1.22  CALCIUM 8.6 8.8  MG -- --  PHOS -- --   Liver Function Tests:  [facility] 08/12/12 0829  AST 18  ALT 13  ALKPHOS 61  BILITOT 0.4  PROT 6.6  ALBUMIN 3.6   No results found for this basename: LIPASE:2,AMYLASE:2 in the last 72 hours No results found for this basename: AMMONIA:2 in the last 72 hours CBC:  Basename 08/14/12 0501 08/13/12 0152 08/12/12 0733  WBC 5.2 5.5 --  NEUTROABS PENDING -- 2.4  HGB 8.8* 7.5* --  HCT 26.7* 22.0* --  MCV 96.4 98.2 --  PLT PENDING 147* --   Cardiac Enzymes:  Basename 08/13/12 0045 08/12/12 1949 08/12/12 1630  CKTOTAL -- -- --  CKMB -- -- --  CKMBINDEX -- -- --  TROPONINI <0.30 <0.30 <0.30    BNP:  Basename 08/12/12 0829  PROBNP 2445.0*   D-Dimer: No results found for this basename: DDIMER:2 in the last 72 hours CBG:  Basename 08/14/12 0749 08/13/12 2028 08/13/12 1651 08/13/12 1145 08/13/12 0741 08/12/12 2046  GLUCAP 118* 139* 135* 165* 130* 157*   Hemoglobin A1C: No results found for this basename: HGBA1C in the last 72 hours Fasting Lipid Panel: No results found for this basename: CHOL,HDL,LDLCALC,TRIG,CHOLHDL,LDLDIRECT in the last 72 hours Thyroid Function Tests: No results found for this basename: TSH,T4TOTAL,FREET4,T3FREE,THYROIDAB in the last 72 hours Anemia Panel:  Basename 08/12/12 1324  VITAMINB12 455  FOLATE --  FERRITIN 278  TIBC 279  IRON 160*  RETICCTPCT 0.8   Coagulation: No results found for this basename: LABPROT:2,INR:2 in the last 72 hours Urine Drug Screen: Drugs of Abuse  No results found for this basename: labopia, cocainscrnur, labbenz, amphetmu, thcu, labbarb    Alcohol Level: No results found for this basename: ETH:2 in the last 72 hours Urinalysis: No results found for this basename: COLORURINE:2,APPERANCEUR:2,LABSPEC:2,PHURINE:2,GLUCOSEU:2,HGBUR:2,BILIRUBINUR:2,KETONESUR:2,PROTEINUR:2,UROBILINOGEN:2,NITRITE:2,LEUKOCYTESUR:2 in the last 72 hours Misc. Labs:  ABGS No results found for this basename: PHART,PCO2,PO2ART,TCO2,HCO3 in the last 72 hours CULTURES Recent Results (from the past 240 hour(s))  MRSA PCR SCREENING     Status: Normal   Collection Time   08/12/12  4:30 PM  Component Value Range Status Comment   MRSA by PCR NEGATIVE  NEGATIVE Final    Studies/Results: No results found.  Medications:  Prior to Admission:  Prescriptions prior to admission  Medication Sig Dispense Refill  . acetaminophen (TYLENOL) 500 MG tablet Take 1 tablet (500 mg total) by mouth every 6 (six) hours as needed. Pain  30 tablet    . amiodarone (PACERONE) 200 MG tablet Take 0.5 tablets (100 mg total) by mouth daily.  30 tablet  5  .  amLODipine (NORVASC) 5 MG tablet Take 5 mg by mouth daily.      Marland Kitchen aspirin 81 MG tablet Take 81 mg by mouth daily.      . diazepam (VALIUM) 5 MG tablet Take 5 mg by mouth every 6 (six) hours as needed. Muscles Spasms      . HYDROcodone-acetaminophen (NORCO) 5-325 MG per tablet Take 1 tablet by mouth every 6 (six) hours as needed. Pain      . metFORMIN (GLUCOPHAGE) 500 MG tablet Take 500 mg by mouth 2 (two) times daily with a meal.      . metoprolol tartrate (LOPRESSOR) 25 MG tablet Take 25 mg by mouth 2 (two) times daily.        . Multiple Vitamin (MULTIVITAMIN) tablet Take 1 tablet by mouth daily.        . nitroGLYCERIN (NITROSTAT) 0.4 MG SL tablet Place 1 tablet (0.4 mg total) under the tongue every 5 (five) minutes x 3 doses as needed for chest pain (up to 3 doses).  25 tablet  3  . pregabalin (LYRICA) 75 MG capsule Take 75 mg by mouth once.      . Red Yeast Rice 600 MG TABS Take 1 tablet by mouth daily.        . silodosin (RAPAFLO) 8 MG CAPS capsule Take 8 mg by mouth daily with breakfast.         Scheduled:   . amiodarone  100 mg Oral Daily  . amLODipine  5 mg Oral Daily  . aspirin  81 mg Oral Daily  . [COMPLETED] furosemide  40 mg Intravenous Once  . insulin aspart  0-5 Units Subcutaneous QHS  . insulin aspart  0-9 Units Subcutaneous TID WC  . metFORMIN  500 mg Oral BID WC  . metoprolol tartrate  25 mg Oral BID  . multivitamin with minerals  1 tablet Oral Daily  . pantoprazole (PROTONIX) IV  40 mg Intravenous Daily  . Tamsulosin HCl  0.4 mg Oral Daily   Continuous:   . sodium chloride Stopped (08/13/12 1610)   RUE:[id number], acetaminophen, albuterol, alum & mag hydroxide-simeth, diazepam, HYDROcodone-acetaminophen, nitroGLYCERIN, ondansetron (ZOFRAN) IV, ondansetron  Assesment: He has anemia. This was thought to be secondary to bleeding but his stools are negative for blood. There is a positive family history of some sort of a bone marrow blood dyscrasia Active Problems:   DYSLIPIDEMIA  CAD  PVD  OSTEOARTHRITIS  CAROTID ARTERY STENOSIS  AVM (arteriovenous malformation) of colon without hemorrhage  Anemia, unspecified  Angina effort    Plan: He needs hematology/oncology consultation.    LOS: 2 days   Monzerrat Wellen L 08/14/2012, 8:50 AM

## 2012-08-14 NOTE — Care Management Note (Unsigned)
    Page 1 of 1   08/14/2012     3:11:36 PM   CARE MANAGEMENT NOTE 08/14/2012  Patient:  [name], [name]   Account Number:  [id number]  Date Initiated:  08/14/2012  Documentation initiated by:  Sharrie Rothman  Subjective/Objective Assessment:   Pt admitted from home with anemia. Pt lives with his wife and will return home at discharge. Pt is independent with ADL's. Pt has a cane for prn use.     Action/Plan:   No CM or HH needs noted.   Anticipated DC Date:  08/16/2012   Anticipated DC Plan:  HOME/SELF CARE      DC Planning Services  CM consult      Choice offered to / List presented to:             Status of service:  In process, will continue to follow Medicare Important Message given?   (If response is "NO", the following Medicare IM given date [name] will be blank) Date Medicare IM given:   Date Additional Medicare IM given:    Discharge Disposition:    Per UR Regulation:    If discussed at Long Length of Stay Meetings, dates discussed:    Comments:  08/14/12 1510 Arlyss Queen, RN BSN CM

## 2012-08-14 NOTE — Progress Notes (Signed)
UR Chart Review Completed  

## 2012-08-14 NOTE — Progress Notes (Signed)
Patient ID: [name], male   DOB: Sep 01, 1932, 78 y.o.   MRN: [id number] Says he feels better. In BR while I am in room. Did have a brown stool. I did not see any obvious blood. Was guaiac negative with Dr. Karilyn Cota yesterday. He has received 3 units of blood since admission.  Filed Vitals:   08/13/12 1559 08/13/12 2059 08/13/12 2115 08/14/12 0441  BP: 119/48  94/37 131/52  Pulse: 80 67 66 65  Temp: 98.2 F (36.8 C)  98 F (36.7 C) 98.7 F (37.1 C)  TempSrc: Oral  Oral Oral  Resp: 20 20 20 20   Height:      Weight:    203 lb 11.3 oz (92.4 kg)  SpO2:  97% 97% 97%   CBC    Component Value Date/Time   WBC 5.2 08/14/2012 0501   RBC 2.77* 08/14/2012 0501   HGB 8.8* 08/14/2012 0501   HCT 26.7* 08/14/2012 0501   PLT 137* 08/14/2012 0501   MCV 96.4 08/14/2012 0501   MCH 31.8 08/14/2012 0501   MCHC 33.0 08/14/2012 0501   RDW 19.0* 08/14/2012 0501   LYMPHSABS 0.9 08/14/2012 0501   MONOABS 0.1 08/14/2012 0501   EOSABS 0.6 08/14/2012 0501   BASOSABS 0.3* 08/14/2012 0501     Anemia: Stool guaiac negative. Doubt this is a GI bleed. Consult placed for hematology by Dr. Karilyn Cota.

## 2012-08-14 NOTE — Progress Notes (Signed)
The patient is receiving Protonix by the intravenous route.  Based on criteria approved by the Pharmacy and Therapeutics Committee and the Medical Executive Committee, the medication is being converted to the equivalent oral dose form.  These criteria include: -No Active GI bleeding -Able to tolerate diet of full liquids (or better) or tube feeding OR able to tolerate other medications by the oral or enteral route  If you have any questions about this conversion, please contact the Pharmacy Department (ext [phone]).  Thank you.  [name], [facility] 08/14/2012 10:53 AM

## 2012-08-15 ENCOUNTER — Inpatient Hospital Stay (HOSPITAL_COMMUNITY): Payer: Medicare Other

## 2012-08-15 ENCOUNTER — Encounter (HOSPITAL_COMMUNITY): Payer: Self-pay | Admitting: *Deleted

## 2012-08-15 ENCOUNTER — Encounter (HOSPITAL_COMMUNITY)
Admission: EM | Disposition: A | Discharge status: Home / Self Care | Payer: Self-pay | Source: Home / Self Care | Attending: Pulmonary Disease

## 2012-08-15 HISTORY — PX: LYMPH NODE BIOPSY: SHX201

## 2012-08-15 LAB — HEPATITIS PANEL, ACUTE
HCV Ab: NEGATIVE
Hep A IgM: NEGATIVE
Hep B C IgM: NEGATIVE
Hepatitis B Surface Ag: NEGATIVE

## 2012-08-15 LAB — CBC WITH DIFFERENTIAL/PLATELET
Basophils Absolute: 0.3 10*3/uL — ABNORMAL HIGH (ref 0.0–0.1)
HCT: 25.9 % — ABNORMAL LOW (ref 39.0–52.0)
Lymphocytes Relative: 20 % (ref 12–46)
Lymphs Abs: 1 10*3/uL (ref 0.7–4.0)
MCV: 97 fL (ref 78.0–100.0)
Monocytes Absolute: 0.3 10*3/uL (ref 0.1–1.0)
Neutro Abs: 2.8 10*3/uL (ref 1.7–7.7)
RBC: 2.67 MIL/uL — ABNORMAL LOW (ref 4.22–5.81)
RDW: 18.1 % — ABNORMAL HIGH (ref 11.5–15.5)
WBC: 5.1 10*3/uL (ref 4.0–10.5)

## 2012-08-15 LAB — BETA 2 MICROGLOBULIN, SERUM: Beta-2 Microglobulin: 3.31 mg/L — ABNORMAL HIGH (ref 1.01–1.73)

## 2012-08-15 LAB — GLUCOSE, CAPILLARY
Glucose-Capillary: 124 mg/dL — ABNORMAL HIGH (ref 70–99)
Glucose-Capillary: 128 mg/dL — ABNORMAL HIGH (ref 70–99)

## 2012-08-15 SURGERY — LYMPH NODE BIOPSY
Anesthesia: LOCAL | Laterality: Left | Wound class: Clean

## 2012-08-15 MED ORDER — LIDOCAINE HCL (PF) 1 % IJ SOLN
INTRAMUSCULAR | Status: AC
  Filled 2012-08-15: qty 30

## 2012-08-15 MED ORDER — BACITRACIN-NEOMYCIN-POLYMYXIN 400-5-5000 EX OINT
TOPICAL_OINTMENT | CUTANEOUS | Status: AC
  Filled 2012-08-15: qty 1

## 2012-08-15 MED ORDER — LIDOCAINE HCL (PF) 1 % IJ SOLN
INTRAMUSCULAR | Status: DC | PRN
  Administered 2012-08-15: 6 mL via INTRADERMAL

## 2012-08-15 SURGICAL SUPPLY — 34 items
APPLIER CLIP 9.375 SM OPEN (CLIP)
APR CLP SM 9.3 20 MLT OPN (CLIP)
ATTRACTOMAT 16X20 MAGNETIC DRP (DRAPES) ×1 IMPLANT
BAG HAMPER (MISCELLANEOUS) ×2 IMPLANT
CLIP APPLIE 9.375 SM OPEN (CLIP) IMPLANT
CLOTH BEACON ORANGE TIMEOUT ST (SAFETY) ×2 IMPLANT
COVER LIGHT HANDLE STERIS (MISCELLANEOUS) ×4 IMPLANT
DRAPE PROXIMA HALF (DRAPES) ×2 IMPLANT
ELECT REM PT RETURN 9FT ADLT (ELECTROSURGICAL) ×2
ELECTRODE REM PT RTRN 9FT ADLT (ELECTROSURGICAL) ×1 IMPLANT
FORMALIN 10 PREFIL 480ML (MISCELLANEOUS) ×1 IMPLANT
GLOVE SKINSENSE NS SZ7.0 (GLOVE) ×1
GLOVE SKINSENSE STRL SZ7.0 (GLOVE) ×1 IMPLANT
GOWN STRL REIN XL XLG (GOWN DISPOSABLE) ×4 IMPLANT
KIT ROOM TURNOVER APOR (KITS) ×2 IMPLANT
MANIFOLD NEPTUNE II (INSTRUMENTS) IMPLANT
MARKER SKIN DUAL TIP RULER LAB (MISCELLANEOUS) ×2 IMPLANT
NEEDLE HYPO 25X1 1.5 SAFETY (NEEDLE) ×2 IMPLANT
PACK MINOR (CUSTOM PROCEDURE TRAY) IMPLANT
PAD ARMBOARD 7.5X6 YLW CONV (MISCELLANEOUS) ×2 IMPLANT
PAD TELFA 3X4 1S STER (GAUZE/BANDAGES/DRESSINGS) ×1 IMPLANT
SET BASIN LINEN APH (SET/KITS/TRAYS/PACK) ×2 IMPLANT
SOL PREP PROV IODINE SCRUB 4OZ (MISCELLANEOUS) ×2 IMPLANT
SPONGE GAUZE 4X4 12PLY (GAUZE/BANDAGES/DRESSINGS) IMPLANT
SPONGE INTESTINAL PEANUT (DISPOSABLE) ×1 IMPLANT
STAPLER VISISTAT 35W (STAPLE) IMPLANT
SUT SILK 3 0 (SUTURE) ×2
SUT SILK 3-0 18XBRD TIE 12 (SUTURE) IMPLANT
SUT VIC AB 3-0 SH 27 (SUTURE)
SUT VIC AB 3-0 SH 27X BRD (SUTURE) ×2 IMPLANT
SYR BULB IRRIGATION 50ML (SYRINGE) ×1 IMPLANT
SYR CONTROL 10ML LL (SYRINGE) ×2 IMPLANT
TOWEL OR 17X26 4PK STRL BLUE (TOWEL DISPOSABLE) ×2 IMPLANT
WATER STERILE IRR 1000ML POUR (IV SOLUTION) ×2 IMPLANT

## 2012-08-15 NOTE — Progress Notes (Signed)
Pt discharged home today per Dr. Juanetta Gosling. Pt's VS stable at this time. Pt's IV site D/C'd and WNL. Pt provided with home medication list and discharge instructions. Pt verbalized understanding. Pt left floor via WC in stable condition accompanied by NT.

## 2012-08-15 NOTE — Brief Op Note (Signed)
08/12/2012 - 08/15/2012  1:05 PM  PATIENT:  [name]  78 y.o. male  PRE-OPERATIVE DIAGNOSIS:  lesion left neck  POST-OPERATIVE DIAGNOSIS:  * No post-op diagnosis entered *  PROCEDURE:  Procedure(s) (LRB) with comments: LYMPH NODE BIOPSY (N/A) - Cervical Lymph Node Bx in Minor Room  SURGEON:  Surgeon(s) and Role:    * Marlane Hatcher, MD - Primary  PHYSICIAN ASSISTANT:   ASSISTANTS: none   ANESTHESIA:   local  EBL:  Total I/O In: 340 [P.O.:340] Out: 250 [Urine:250]  BLOOD ADMINISTERED:none  DRAINS: none   LOCAL MEDICATIONS USED:  LIDOCAINE 1% without epi  6 cc  SPECIMEN:  Source of Specimen:  Left neck post. triangle.  DISPOSITION OF SPECIMEN:  PATHOLOGY  COUNTS:  YES  TOURNIQUET:  * No tourniquets in log *  DICTATION: .Other Dictation: Dictation Number OR dict # [id number].  PLAN OF CARE: Admit to inpatient   PATIENT DISPOSITION:  PACU - hemodynamically stable.   Delay start of Pharmacological VTE agent (>24hrs) due to surgical blood loss or risk of bleeding: not applicable

## 2012-08-15 NOTE — Progress Notes (Signed)
Surgery asked to see this 78 yr old W male for biopsy of left lymph node.  Pt seen and examined and labs reviewed and CT of neck reviewed.  In essence this patient has a small lymph node in post triangle of Left neck.  Oncology service requests biopsy.  I think I can do this under local prior to anticipated discharge today.  Will discuss with Med. Service. Thanks for consult.

## 2012-08-15 NOTE — Progress Notes (Signed)
Surgery  Lymph node biopsied under local.  Small pea-sized lymph node in post triangle of left neck.  This was sent as a path RUSH specimen in a saline soaked gauze. No complications.  I will follow up with pt. In one week for suture removal.  He can be discharged as per med service.

## 2012-08-16 NOTE — Op Note (Signed)
NAMEBERTHEL, KAPAUN NO.:  [id number]  MEDICAL RECORD NO.:  [id number]  LOCATION:  A325                          FACILITY:  [facility]  PHYSICIAN:  Barbaraann Barthel, M.D. DATE OF BIRTH:  May 13, 1932  DATE OF PROCEDURE:  08/14/2012 DATE OF DISCHARGE:  08/15/2012                              OPERATIVE REPORT   SURGEON:  Barbaraann Barthel, MD.  PREOPERATIVE DIAGNOSIS:  Lymphadenopathy left posterior neck.  POSTOPERATIVE DIAGNOSIS:  Lymphadenopathy left posterior neck.  WOUND CLASSIFICATION:  Clean.  Note, this is a 78 year old white male who had a lymph node palpated by the Oncology Service and they requested biopsy in the workup of his recurrent anemia.  We discussed complications not limited to, but including bleeding and infection.  Informed consent was obtained.  The specimen will be sent in saline soaked gauze with a rush to pathology.  TECHNIQUE:  The patient was placed in the right lateral decubitus position.  His left neck was prepped with Betadine solution and draped in usual manner.  An incision was carried out over the palpated mass through skin, subcutaneous tissue, and a very superficial lymph node was then biopsied.  The vascular supply to this was ligated with 3-0 Polysorb, and the shotty lymph node approximately the size of a small pea was removed and sent in a saline soaked gauze to pathology.  To check for hemostasis, cautery was used for small bleeding and we then closed the skin with a 4-0 nylon suture.  Prior to closure, all sponge, needle, and instrument counts were found to be correct.  Estimated blood loss was minimal.  No drains were placed, and there were no complications.     Barbaraann Barthel, M.D.     WB/MEDQ  D:  08/15/2012  T:  08/16/2012  Job:  [id number]  cc:   Ladona Horns. Mariel Sleet, MD Fax: [phone]

## 2012-08-16 NOTE — Discharge Summary (Signed)
Physician Discharge Summary  Patient ID: [name] MRN: [id number] DOB/AGE: September 10, 1932 78 y.o. Primary Care Physician:Khristen Cheyney L, MD Admit date: 08/12/2012 Discharge date: 08/16/2012    Discharge Diagnoses:  Cervical lymphadenopathy Active Problems:  DYSLIPIDEMIA  CAD  PVD  OSTEOARTHRITIS  CAROTID ARTERY STENOSIS  AVM (arteriovenous malformation) of colon without hemorrhage  Anemia, unspecified  Angina effort  possible bone marrow suppression    Medication List     As of 08/16/2012  8:15 AM    TAKE these medications         acetaminophen 500 MG tablet   Commonly known as: TYLENOL   Take 1 tablet (500 mg total) by mouth every 6 (six) hours as needed. Pain      amiodarone 200 MG tablet   Commonly known as: PACERONE   Take 0.5 tablets (100 mg total) by mouth daily.      amLODipine 5 MG tablet   Commonly known as: NORVASC   Take 5 mg by mouth daily.      aspirin 81 MG tablet   Take 81 mg by mouth daily.      diazepam 5 MG tablet   Commonly known as: VALIUM   Take 5 mg by mouth every 6 (six) hours as needed. Muscles Spasms      HYDROcodone-acetaminophen 5-325 MG per tablet   Commonly known as: NORCO/VICODIN   Take 1 tablet by mouth every 6 (six) hours as needed. Pain      metFORMIN 500 MG tablet   Commonly known as: GLUCOPHAGE   Take 500 mg by mouth 2 (two) times daily with a meal.      metoprolol tartrate 25 MG tablet   Commonly known as: LOPRESSOR   Take 25 mg by mouth 2 (two) times daily.      multivitamin tablet   Take 1 tablet by mouth daily.      nitroGLYCERIN 0.4 MG SL tablet   Commonly known as: NITROSTAT   Place 1 tablet (0.4 mg total) under the tongue every 5 (five) minutes x 3 doses as needed for chest pain (up to 3 doses).      pregabalin 75 MG capsule   Commonly known as: LYRICA   Take 75 mg by mouth once.      RAPAFLO 8 MG Caps capsule   Generic drug: silodosin   Take 8 mg by mouth daily with breakfast.      Red Yeast  Rice 600 MG Tabs   Take 1 tablet by mouth daily.        Discharged Condition: Improved    Consults: Hematology oncology/gastroenterology  Significant Diagnostic Studies: Ct Abdomen Pelvis Wo Contrast  08/15/2012  *RADIOLOGY REPORT*  Clinical Data:  Evaluate for lymphadenopathy  CT CHEST, ABDOMEN AND PELVIS WITHOUT CONTRAST  Technique:  Multidetector CT imaging of the chest, abdomen and pelvis was performed following the standard protocol without IV contrast.  Comparison:   None.  CT CHEST  Findings:  Mild paraseptal and centrilobular emphysematous changes. No suspicious pulmonary nodules.  Trace bilateral pleural effusions.  Associated lower lobe opacities, likely atelectasis. No pneumothorax.  Visualized left thyroid is mildly heterogeneous/nodular.  The heart is normal in size.  No pericardial effusions. Postsurgical changes related to prior CABG.  Atherosclerotic calcifications of the aortic arch.  Small mediastinal lymph nodes measuring up to 6 mm short axis, which does not meet pathologic CT size criteria.  No suspicious axillary lymphadenopathy.  Degenerative changes of the thoracic spine.  IMPRESSION: No suspicious thoracic  lymphadenopathy.  Trace bilateral pleural effusions with associated lower lobe atelectasis.  Mild emphysematous changes.  CT ABDOMEN AND PELVIS  Findings:  Unenhanced liver, spleen, pancreas, and adrenal glands are unremarkable.  Status post cholecystectomy.  No intrahepatic or extrahepatic ductal dilatation.  2.4 x 2.1 cm right upper pole cyst (series 2/images 67).  Kidneys are otherwise unremarkable.  No renal calculi or hydronephrosis.  No evidence of bowel obstruction.  Normal appendix.  Extensive colonic diverticulosis, without associated inflammatory changes.  Atherosclerotic calcifications of the abdominal aorta and branch vessels.  No abdominopelvic ascites.  No suspicious abdominopelvic lymphadenopathy.  Prostatomegaly, measuring 6.4 cm in transverse dimension.   Bladder is mildly thick-walled although underdistended.  Degenerative changes of the lumbar spine.  L3-4 fusion (series 5/image 67).  IMPRESSION: No suspicious abdominopelvic lymphadenopathy.  Prostatomegaly.   Original Report Authenticated By: Charline Bills, M.D.    Dg Chest 2 View  08/12/2012  *RADIOLOGY REPORT*  Clinical Data: Chest pain.  CHEST - 2 VIEW  Comparison: 01/03/2012 and 10/06/2011  Findings: Two views of the chest demonstrate previous CABG procedure.  There may be mild volume loss at the right lung base. There are subtle densities at the right lung base that appear new from the prior examinations.  Heart size is stable.  Left lung appears clear.  No definite pleural effusions.  IMPRESSION: Subtle densities at the right lung base.  The differential diagnosis would include atelectasis versus subtle airspace disease.   Original Report Authenticated By: Richarda Overlie, M.D.    Ct Soft Tissue Neck W Contrast  08/11/2012  *RADIOLOGY REPORT*  Clinical Data: Soft tissue knot left-side of neck present 2 years appears to have enlarged.    Denies pain.  CT NECK WITH CONTRAST  Technique:  Multidetector CT imaging of the neck was performed with intravenous contrast.  Contrast: OMNIPAQUE IOHEXOL 300 MG/ML  SOLN  Comparison: 01/18/2012 and 11/06/2010.  Findings: The subcutaneous nodule at the posterior lateral margin of the left sternocleidomastoid muscle where the patient has a palpable abnormality has increased minimally in size since the most remote exam of 11/06/2010 now measuring 9.3 x 8.1 x 9.6 mm versus prior 9.0 x 8.1 x 8.2 mm.  Etiology/significance indeterminate. This does not have a cleft like appearance as seen with a simple lymph node.  This may represent a benign subcutaneous lesion which has become irritated/inflamed causing patient's discomfort.  Slow growing tumor not excluded.  Present exam is motion degraded and not performed as a CT angiogram however, there appears to have been  progression of diffuse atherosclerotic type changes most notable involving the right internal carotid artery where there is severe narrowing with a string like appearance.  Stable appearance of lung parenchymal changes.  Stable heterogeneous appearance of the thyroid gland more notable on the left.  Cervical spondylotic changes most notable C3-4.  Underfilling of the left piriform sinus without definitive mass identified.  Slightly prominent lingual tonsillar tissue fills the vallecula.  IMPRESSION: The subcutaneous nodule at the posterior lateral margin of the left sternocleidomastoid muscle where the patient has a palpable abnormality has increased minimally in size since the most remote exam of 11/06/2010 now measuring 9.3 x 8.1 x 9.6 mm versus prior 9.0 x 8.1 x 8.2 mm.  Etiology/significance indeterminate. This does not have a cleft like appearance as seen with a simple lymph node. This may represent a benign subcutaneous lesion which has become irritated/inflamed causing patient's discomfort.  Slow growing tumor not excluded.  Right nternal carotid artery severe  narrowing with a string like appearance.  Stable heterogeneous appearance of the thyroid gland more notable on the left.  Underfilling of the left piriform sinus without definitive mass identified.   Original Report Authenticated By: Lacy Duverney, M.D.    Ct Chest Wo Contrast  08/15/2012  *RADIOLOGY REPORT*  Clinical Data:  Evaluate for lymphadenopathy  CT CHEST, ABDOMEN AND PELVIS WITHOUT CONTRAST  Technique:  Multidetector CT imaging of the chest, abdomen and pelvis was performed following the standard protocol without IV contrast.  Comparison:   None.  CT CHEST  Findings:  Mild paraseptal and centrilobular emphysematous changes. No suspicious pulmonary nodules.  Trace bilateral pleural effusions.  Associated lower lobe opacities, likely atelectasis. No pneumothorax.  Visualized left thyroid is mildly heterogeneous/nodular.  The heart is normal in  size.  No pericardial effusions. Postsurgical changes related to prior CABG.  Atherosclerotic calcifications of the aortic arch.  Small mediastinal lymph nodes measuring up to 6 mm short axis, which does not meet pathologic CT size criteria.  No suspicious axillary lymphadenopathy.  Degenerative changes of the thoracic spine.  IMPRESSION: No suspicious thoracic lymphadenopathy.  Trace bilateral pleural effusions with associated lower lobe atelectasis.  Mild emphysematous changes.  CT ABDOMEN AND PELVIS  Findings:  Unenhanced liver, spleen, pancreas, and adrenal glands are unremarkable.  Status post cholecystectomy.  No intrahepatic or extrahepatic ductal dilatation.  2.4 x 2.1 cm right upper pole cyst (series 2/images 67).  Kidneys are otherwise unremarkable.  No renal calculi or hydronephrosis.  No evidence of bowel obstruction.  Normal appendix.  Extensive colonic diverticulosis, without associated inflammatory changes.  Atherosclerotic calcifications of the abdominal aorta and branch vessels.  No abdominopelvic ascites.  No suspicious abdominopelvic lymphadenopathy.  Prostatomegaly, measuring 6.4 cm in transverse dimension.  Bladder is mildly thick-walled although underdistended.  Degenerative changes of the lumbar spine.  L3-4 fusion (series 5/image 67).  IMPRESSION: No suspicious abdominopelvic lymphadenopathy.  Prostatomegaly.   Original Report Authenticated By: Charline Bills, M.D.     Lab Results: Basic Metabolic Panel:  Basename 08/14/12 0501  NA 141  K 3.6  CL 104  CO2 26  GLUCOSE 111*  BUN 24*  CREATININE 1.40*  CALCIUM 8.6  MG --  PHOS --   Liver Function Tests: No results found for this basename: AST:2,ALT:2,ALKPHOS:2,BILITOT:2,PROT:2,ALBUMIN:2 in the last 72 hours   CBC:  Basename 08/15/12 0458 08/14/12 0501  WBC 5.1 5.2  NEUTROABS 2.8 3.3  HGB 8.8* 8.8*  HCT 25.9* 26.7*  MCV 97.0 96.4  PLT 129* 137*    Recent Results (from the past 240 hour(s))  MRSA PCR SCREENING      Status: Normal   Collection Time   08/12/12  4:30 PM      Component Value Range Status Comment   MRSA by PCR NEGATIVE  NEGATIVE Final      Hospital Course: He was admitted with chest discomfort. He says this seems to happen when he gets anemic. He was anemic in my office but his anemia was much worse. He received a total of 3 units of packed red blood cells in his chest discomfort totally resolved. He ruled out for myocardial infarction. He had GI consultation with the thought that he had recurrent GI bleeding but his stools were negative for blood. At that point we were concerned that he might have some sort of a bone marrow problem. He has been noted to have lymphadenopathy in his neck and head CT about a week ago that showed one of the lymph  nodes have grown slightly. Consultation was obtained with Dr. Malvin Johns, general surgery and the lymph node was removed. He had hematology oncology consultation and it was felt that he's going to have to have further workup. He may need a bone marrow biopsy. CT did not show significant adenopathy anywhere else.  Discharge Exam: Blood pressure 127/70, pulse 64, temperature 97.6 F (36.4 C), temperature source Oral, resp. rate 20, height 5' 11.5" (1.816 m), weight 92 kg (202 lb 13.2 oz), SpO2 97.00%. He is awake and alert. He is comfortable. His chest is clear. His heart is regular  Disposition: Home he will follow with hematology/oncology. He will have a CBC on the 26th.      Discharge Orders    Future Appointments: Provider: Department: Dept Phone: Center:   08/29/2012 10:00 AM Malissa Hippo, MD [facility] [phone] None   12/06/2012 1:30 PM [facility] [phone] [facility]   12/06/2012 2:40 PM Evern Bio, NP [facility] [phone] [facility]   06/13/2013 1:00 PM [facility] [phone] [facility]   06/13/2013 2:00 PM  Chuck Hint, MD [facility] [phone] [facility]     Future Orders Please Complete By Expires   Discharge patient           Signed: [name] Pager [phone]  08/16/2012, 8:15 AM

## 2012-08-18 ENCOUNTER — Encounter (HOSPITAL_COMMUNITY): Payer: Self-pay | Admitting: General Surgery

## 2012-08-18 ENCOUNTER — Other Ambulatory Visit (HOSPITAL_COMMUNITY): Payer: Self-pay | Admitting: Oncology

## 2012-08-29 ENCOUNTER — Encounter (INDEPENDENT_AMBULATORY_CARE_PROVIDER_SITE_OTHER): Payer: Self-pay | Admitting: Internal Medicine

## 2012-08-29 ENCOUNTER — Ambulatory Visit (INDEPENDENT_AMBULATORY_CARE_PROVIDER_SITE_OTHER): Payer: Medicare Other | Admitting: Internal Medicine

## 2012-08-29 VITALS — BP 128/74 | HR 68 | Temp 97.5°F | Resp 20 | Ht 71.0 in | Wt 202.4 lb

## 2012-08-29 DIAGNOSIS — D649 Anemia, unspecified: Secondary | ICD-10-CM

## 2012-08-29 NOTE — Progress Notes (Signed)
Presenting complaint;  Followup for anemia.  Subjective: Patient is 78 year old Caucasian male who was hospitalized recently for shortness of breath and chest pain and noted to be anemic. He was given blood transfusion. I saw him in consultation and noted his stool  to be guaiac-negative. His stool was guaiac negative in June 2013 as well. He has history of GI bleed back in January 2013 when he was on an anticoagulatant but this was discontinued. Multiple polyps were removed on 2 prior colonoscopies. He is feeling well since he was discharged from the hospital. He is scheduled to have bone marrow aspiration and biopsy next week.Marland Kitchen His wife is wondering if this is necessary and she was told that indeed it is. He had hemoglobin checked by Dr. Juanetta Gosling last week and was 9.1 g. He denies abdominal pain melena or rectal bleeding. He is having 1 formed stool daily. He also denies heartburn nausea or vomiting.   Current Medications: Current Outpatient Prescriptions  Medication Sig Dispense Refill  . acetaminophen (TYLENOL) 500 MG tablet Take 1 tablet (500 mg total) by mouth every 6 (six) hours as needed. Pain  30 tablet    . amiodarone (PACERONE) 200 MG tablet Take 0.5 tablets (100 mg total) by mouth daily.  30 tablet  5  . amLODipine (NORVASC) 5 MG tablet Take 5 mg by mouth daily.      Marland Kitchen aspirin 81 MG tablet Take 81 mg by mouth daily.      . diazepam (VALIUM) 5 MG tablet Take 5 mg by mouth every 6 (six) hours as needed. Muscles Spasms      . HYDROcodone-acetaminophen (NORCO) 5-325 MG per tablet Take 1 tablet by mouth every 6 (six) hours as needed. Pain      . metFORMIN (GLUCOPHAGE) 500 MG tablet Take 500 mg by mouth 2 (two) times daily with a meal.      . metoprolol tartrate (LOPRESSOR) 25 MG tablet Take 25 mg by mouth 2 (two) times daily.        . Multiple Vitamin (MULTIVITAMIN) tablet Take 1 tablet by mouth daily.        . nitroGLYCERIN (NITROSTAT) 0.4 MG SL tablet Place 1 tablet (0.4 mg total) under  the tongue every 5 (five) minutes x 3 doses as needed for chest pain (up to 3 doses).  25 tablet  3  . pregabalin (LYRICA) 75 MG capsule Take 75 mg by mouth once.      . Red Yeast Rice 600 MG TABS Take 1 tablet by mouth daily.        . silodosin (RAPAFLO) 8 MG CAPS capsule Take 8 mg by mouth daily with breakfast.           Objective: Blood pressure 128/74, pulse 68, temperature 97.5 F (36.4 C), temperature source Oral, resp. rate 20, height 5\' 11"  (1.803 m), weight 202 lb 6.4 oz (91.808 kg). Patient is alert and remains in jovial mood. Conjunctiva is pink. Sclera is nonicteric Oropharyngeal mucosa is normal. No neck masses or thyromegaly noted. Abdomen is soft and nontender without organomegaly or masses. No LE edema or clubbing noted.  Labs/studies Results: H&H from 05/02/2012 was 11.5 and 35.9. MCV 72.1  Assessment:  Anemia without evidence of GI bleed which he has history of. His stool was guaiac negative in June and again during recent hospitalization. He clearly has another reason for his anemia. He possibly has bone marrow dysfunction. Hhe is scheduled to have bone marrow aspiration and biopsy next week. Dr. Juanetta Gosling  is keeping an hour his H&H. Since he become symptomatic when his hemoglobin drops below 8-1/2 g, we should consider transfusion at 8.5 g and not wait until hemoglobin drops below 8. At this point I do not feel there is indication or need for small bowel given capsule study. She is presently not taking PPI. He is not having any symptoms of GERD. EGD in January 2013 revealed single erosion at distal esophagus. Therefore will monitor.   Plan:  Patient will call should he experience melena or frank rectal bleeding.

## 2012-08-29 NOTE — Patient Instructions (Signed)
Notify if you have rectal bleeding or tarry stools 

## 2012-09-01 ENCOUNTER — Encounter (HOSPITAL_COMMUNITY): Payer: Medicare Other | Attending: Oncology | Admitting: Oncology

## 2012-09-01 ENCOUNTER — Encounter (HOSPITAL_COMMUNITY): Payer: Self-pay | Admitting: Oncology

## 2012-09-01 VITALS — BP 87/41 | HR 68 | Temp 97.6°F | Resp 18 | Wt 201.0 lb

## 2012-09-01 DIAGNOSIS — D696 Thrombocytopenia, unspecified: Secondary | ICD-10-CM

## 2012-09-01 DIAGNOSIS — R634 Abnormal weight loss: Secondary | ICD-10-CM | POA: Insufficient documentation

## 2012-09-01 DIAGNOSIS — D649 Anemia, unspecified: Secondary | ICD-10-CM | POA: Insufficient documentation

## 2012-09-01 DIAGNOSIS — D72824 Basophilia: Secondary | ICD-10-CM | POA: Insufficient documentation

## 2012-09-01 NOTE — Patient Instructions (Addendum)
[facility] Discharge Instructions  RECOMMENDATIONS MADE BY THE CONSULTANT AND ANY TEST RESULTS WILL BE SENT TO YOUR REFERRING PHYSICIAN.   HOLD aspirin the day before and the day of your procedure. You may start back taking aspirin the day after your procedure. You may take a Valium and a Vicodin 1-2 hours prior to biopsy if desired. Vicodin as needed for pain every 6 hours after biopsy. Return to clinic as scheduled for biopsy procedure and MD appointment as already scheduled. If your wife is not able to drive you to the biopsy procedure you will need to bring a driver with you.   Thank you for choosing [facility] to provide your oncology and hematology care.  To afford each patient quality time with our providers, please arrive at least 15 minutes before your scheduled appointment time.  With your help, our goal is to use those 15 minutes to complete the necessary work-up to ensure our physicians have the information they need to help with your evaluation and healthcare recommendations.    Effective January 1st, 2014, we ask that you re-schedule your appointment with our physicians should you arrive 10 or more minutes late for your appointment.  We strive to give you quality time with our providers, and arriving late affects you and other patients whose appointments are after yours.    Again, thank you for choosing [facility].  Our hope is that these requests will decrease the amount of time that you wait before being seen by our physicians.       _____________________________________________________________  I acknowledge that I have been informed and understand all the instructions given to me and received a copy. I do not have anymore questions at this time but understand that I may call the Cancer Center at [facility] at [phone] during business hours should I have any further questions or need assistance in obtaining follow-up  care.    __________________________________________  _____________  __________ Signature of Patient or Authorized Representative            Date                   Time    __________________________________________ Nurse's Signature

## 2012-09-01 NOTE — Progress Notes (Signed)
Caleb Maudlin, MD [address] [place] [address]  1. Anemia, unspecified   2. Thrombocytopenia     CURRENT THERAPY: Work-up  INTERVAL HISTORY: [name] 78 y.o. male returns for  regular  visit for followup of anemia and thrombocytopenia, with weight loss  I personally reviewed and went over laboratory results with the patient.  I personally reviewed and went over pathology results with the patient.  We spent some time discussing a bone marrow aspiration and biopsy.  We spoke about the procedure itself.  We discussed the risks, benefits, alternatives, and potential complications of the procedure. Procedure scheduled on 09/05/2012.   All questions were answered.  Patient denies any questions.  He was informed not to drive himself to and from the procedure.   Again he admits to 20 lbs weight loss x 3 months, but he was "trying to loose weight."  He did this by eliminated carbonated beverages and unhealthful liquids.   He has good appetite.  He denies any B symptoms.  He denies any complaints.    Complete ROS questioning is negative.    Past Medical History  Diagnosis Date  . Hypertension   . COPD (chronic obstructive pulmonary disease)   . CAD (coronary artery disease)     s/p 4V CABG '96, Myoview '10 w/o ischemia  . Arthritis   . Atrial fibrillation     coumadin stopped 12/2011 2/2 anemia  . Dysphagia   . Anemia     requiring blood transfusions  . Carotid stenosis     Dr Waverly Ferrari follows.   . Diabetes mellitus, type 2     A1C 6.7% 12/2011  . HLD (hyperlipidemia)     LDL 65 12/2011, intolerant to statins  . Hematuria     felt 2/2 foley insertion  . LV dysfunction     EF 45-50% by echo 2007  . Melena     EGD & Colonoscopy 09/2011 revealed gastritis, erosions, scars, diverticula, 8 colon polyps (largest 1.6cm, not removed 2/2 anticoagulation), small AVM in transverse colon  . History of tobacco abuse     quit 1992  . Anxiety   .  Shortness of breath     exertion  . GERD (gastroesophageal reflux disease)   . Myocardial infarction   . Thrombocytopenia 09/01/2012    has DYSLIPIDEMIA; CAD; Campath-induced atrial fibrillation; PVD; OSTEOARTHRITIS; CAROTID ARTERY STENOSIS; Long term current use of anticoagulant; GI bleeding; Acute blood loss anemia; AVM (arteriovenous malformation) of colon without hemorrhage; Anemia, unspecified; Angina effort; Anemia; History of colonic polyps; and Thrombocytopenia on his problem list.     is allergic to statins and tape.  [name] does not currently have medications on file.  Past Surgical History  Procedure Date  . Knee surgery 1960    Right knee cartilage  . Rotator cuff repair 1990    right   . Inguinal hernia repair 2000 and 2010    left  . Cholecystectomy 05/2010    Lap chole with biologic mesh repair/reinforcement by  Dr Zachery Dakins  . Back surgery 02/2006    ruptured disk,  post op diskitis infection requiring prolonged hospital stay and  surgical I & D  . Tonsillectomy   . Esophagogastroduodenoscopy 10/08/2011    Procedure: ESOPHAGOGASTRODUODENOSCOPY (EGD);  Surgeon: Malissa Hippo, MD;  [facility];  Service: Endoscopy;  Laterality: N/A;  . Colonoscopy 10/08/2011    Procedure: COLONOSCOPY;  Surgeon: Malissa Hippo, MD;  Location: [facility];  Service: Endoscopy;  Laterality: N/A;  . Cataract extraction, bilateral   . Peg placement 07/2006    placed due to prolonged infection, poor po intake, malnutrition during several week hospital stay resulting from ruptured disc that became infected following surgery  . Colonoscopy 01/14/2012    Procedure: COLONOSCOPY;  Surgeon: Malissa Hippo, MD;  Location: [facility];  Service: Endoscopy;  Laterality: N/A;  945  . Coronary artery bypass graft 1996    4 by-pass  . Ventral hernia repair   . Colonoscopy 04/20/2012    Procedure: COLONOSCOPY;  Surgeon: Malissa Hippo, MD;  Location: [facility];  Service:  Endoscopy;  Laterality: N/A;  1030  . Pr vein bypass graft,aorto-fem-pop 1996  . Spine surgery   . Joint replacement 1960    Right Knee  . Lymph node biopsy 08/15/2012    Procedure: LYMPH NODE BIOPSY;  Surgeon: Marlane Hatcher, MD;  Location: [facility];  Service: General;  Laterality: Left;  Cervical Lymph Node Bx in Minor Room    Denies any headaches, dizziness, double vision, fevers, chills, night sweats, nausea, vomiting, diarrhea, constipation, chest pain, heart palpitations, shortness of breath, blood in stool, black tarry stool, urinary pain, urinary burning, urinary frequency, hematuria.   PHYSICAL EXAMINATION  ECOG PERFORMANCE STATUS: 1 - Symptomatic but completely ambulatory  Filed Vitals:   09/01/12 1400  BP: 87/41  Pulse: 68  Temp: 97.6 F (36.4 C)  Resp: 18    GENERAL:alert, no distress, well nourished, well developed, comfortable, cooperative, smiling and accompanied by wife. SKIN: skin color, texture, turgor are normal, no rashes or significant lesions HEAD: Normocephalic, No masses, lesions, tenderness or abnormalities EYES: normal, Conjunctiva are pink and non-injected EARS: External ears normal OROPHARYNX:mucous membranes are moist  NECK: trachea midline LYMPH:  not examined BREAST:not examined LUNGS: not examined HEART: not examined ABDOMEN:not examined BACK: not examined EXTREMITIES:less then 2 second capillary refill, no skin discoloration  NEURO: alert & oriented x 3 with fluent speech, no focal motor/sensory deficits, gait normal    LABORATORY DATA: CBC    Component Value Date/Time   WBC 5.1 08/15/2012 0458   RBC 2.67* 08/15/2012 0458   HGB 8.8* 08/15/2012 0458   HCT 25.9* 08/15/2012 0458   PLT 129* 08/15/2012 0458   MCV 97.0 08/15/2012 0458   MCH 33.0 08/15/2012 0458   MCHC 34.0 08/15/2012 0458   RDW 18.1* 08/15/2012 0458   LYMPHSABS 1.0 08/15/2012 0458   MONOABS 0.3 08/15/2012 0458   EOSABS 0.7 08/15/2012 0458   BASOSABS 0.3*  08/15/2012 0458      Chemistry      Component Value Date/Time   NA 141 08/14/2012 0501   K 3.6 08/14/2012 0501   CL 104 08/14/2012 0501   CO2 26 08/14/2012 0501   BUN 24* 08/14/2012 0501   CREATININE 1.40* 08/14/2012 0501      Component Value Date/Time   CALCIUM 8.6 08/14/2012 0501   ALKPHOS 61 08/12/2012 0829   AST 18 08/12/2012 0829   ALT 13 08/12/2012 0829   BILITOT 0.4 08/12/2012 0829        PATHOLOGY: 08/15/2012  Diagnosis Lymph node, biopsy, left neck - SPINDLE CELL LIPOMA. Microscopic Comment There are benign appearing spindle cells admixed with mature fatty tissue. No lymphoid tissue is identified. There is no evidence of atypia or malignancy. 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 Dr. Malvin Johns was paged on 08-16-2012. Abigail Miyamoto MD Pathologist, Electronic Signature (Case signed 08/16/2012)  ASSESSMENT:  1. Anemia, unknown etiology 2. Occasional thrombocytopenia, unknown etiology, dating back to 2009 3. Weight loss, 20 lbs in 3 months 4. Basophilia occasionally.    PLAN:  1. I personally reviewed and went over laboratory results with the patient. 2. I personally reviewed and went over pathology results with the patient. 3. Discussion about bone marrow aspiration and biopsy. 4. Discussed the risks, benefits, alternatives, and potential complications of a bone marrow aspiration and biopsy.  5. He has Vicodin at home and he can take 1 tablet 1 hour prior to scheduled procedure.  May repeat every 6 hours as needed for pain. 6. He has Valium at home and he will take that 1 hour prior to the procedure.    All questions were answered. The patient knows to call the clinic with any problems, questions or concerns. We can certainly see the patient much sooner if necessary.  The patient and plan discussed with Glenford Peers, MD and he is in agreement with the aforementioned.  [name]

## 2012-09-05 ENCOUNTER — Encounter (HOSPITAL_BASED_OUTPATIENT_CLINIC_OR_DEPARTMENT_OTHER): Payer: Medicare Other

## 2012-09-05 ENCOUNTER — Other Ambulatory Visit (HOSPITAL_COMMUNITY): Payer: Self-pay | Admitting: Oncology

## 2012-09-05 ENCOUNTER — Encounter (HOSPITAL_BASED_OUTPATIENT_CLINIC_OR_DEPARTMENT_OTHER): Payer: Medicare Other | Admitting: Oncology

## 2012-09-05 VITALS — BP 114/48 | HR 65 | Temp 97.9°F | Resp 16

## 2012-09-05 VITALS — BP 120/44 | HR 63 | Temp 97.5°F | Resp 18

## 2012-09-05 DIAGNOSIS — D649 Anemia, unspecified: Secondary | ICD-10-CM

## 2012-09-05 LAB — CBC WITH DIFFERENTIAL/PLATELET
Basophils Absolute: 0.5 K/uL — ABNORMAL HIGH (ref 0.0–0.1)
Basophils Relative: 13 % — ABNORMAL HIGH (ref 0–1)
Eosinophils Absolute: 0.4 K/uL (ref 0.0–0.7)
Eosinophils Relative: 9 % — ABNORMAL HIGH (ref 0–5)
HCT: 19.1 % — ABNORMAL LOW (ref 39.0–52.0)
Hemoglobin: 6.4 g/dL — CL (ref 13.0–17.0)
Lymphocytes Relative: 25 % (ref 12–46)
Lymphs Abs: 1 K/uL (ref 0.7–4.0)
MCH: 32.5 pg (ref 26.0–34.0)
MCHC: 33.5 g/dL (ref 30.0–36.0)
MCV: 97 fL (ref 78.0–100.0)
Monocytes Absolute: 0.1 K/uL (ref 0.1–1.0)
Monocytes Relative: 3 % (ref 3–12)
Neutro Abs: 2.1 K/uL (ref 1.7–7.7)
Neutrophils Relative %: 50 % (ref 43–77)
Platelets: 119 K/uL — ABNORMAL LOW (ref 150–400)
RBC: 1.97 MIL/uL — ABNORMAL LOW (ref 4.22–5.81)
RDW: 18.2 % — ABNORMAL HIGH (ref 11.5–15.5)
WBC: 4.1 K/uL (ref 4.0–10.5)

## 2012-09-05 LAB — SEDIMENTATION RATE: Sed Rate: 40 mm/h — ABNORMAL HIGH (ref 0–16)

## 2012-09-05 LAB — LACTATE DEHYDROGENASE: LDH: 222 U/L (ref 94–250)

## 2012-09-05 LAB — PREPARE RBC (CROSSMATCH)

## 2012-09-05 LAB — RETICULOCYTES
RBC.: 1.92 MIL/uL — ABNORMAL LOW (ref 4.22–5.81)
Retic Count, Absolute: 7.7 K/uL — ABNORMAL LOW (ref 19.0–186.0)
Retic Ct Pct: 0.4 % (ref 0.4–3.1)

## 2012-09-05 MED ORDER — FUROSEMIDE 10 MG/ML IJ SOLN
20.0000 mg | Freq: Once | INTRAMUSCULAR | Status: AC
  Administered 2012-09-05: 20 mg via INTRAMUSCULAR

## 2012-09-05 MED ORDER — FUROSEMIDE 10 MG/ML IJ SOLN
INTRAMUSCULAR | Status: AC
  Filled 2012-09-05: qty 2

## 2012-09-05 MED ORDER — SODIUM CHLORIDE 0.9 % IJ SOLN
INTRAMUSCULAR | Status: AC
  Filled 2012-09-05: qty 10

## 2012-09-05 MED ORDER — SODIUM CHLORIDE 0.9 % IV SOLN
250.0000 mL | Freq: Once | INTRAVENOUS | Status: AC
  Administered 2012-09-05: 250 mL via INTRAVENOUS

## 2012-09-05 MED ORDER — SODIUM CHLORIDE 0.9 % IJ SOLN
10.0000 mL | INTRAMUSCULAR | Status: AC | PRN
  Administered 2012-09-05: 10 mL
  Filled 2012-09-05: qty 10

## 2012-09-05 NOTE — Progress Notes (Signed)
The patient has a significant anemia as well as mild thrombocytopenia. His renal function is less than normal. Over the last 8 months she has felt progressively weaker, more tired, he has had 10% of his body weight reduced but he states that he is trying to do that intentionally. Nevertheless with two out of three blood lines lower than normal I thought a bone marrow aspiration and biopsy clearly were indicated.  After informed consent he was placed in the prone position and the posterior superior iliac spinous processes were both identified and the left was chosen. The area was cleansed with 3 Betadine swabs followed by 2% local anesthesia consistent at 8-9 cc. A bone marrow aspiration and biopsy were performed without incident and sent for routine pathology as well as cytogenetics evaluation.

## 2012-09-05 NOTE — Progress Notes (Signed)
[facility] BONE MARROW BIOPSY/ASPIRATE PROGRESS NOTE  JAILAN TRIMM presents for Bone Marrow biopsy per MD orders. TEON HUDNALL verbalized understanding of procedure. Valium 5 mg po @ 7am and Hydrocodone 5/325 po taken @ 7:30am by patient at home prior to arrival. Consent reviewed and signed.  Imre Vecchione Hainsworth positioned supine for procedure. Time-out performed and Bone Marrow Checklist. Procedure began at 0850. Xylocaine 2% 10 cc used for local and administered to patient by Dr. Mariel Sleet Procedure completed at [phone]. Patient tolerated well. Pressure dressing applied to the left hip with instructions to leave in place for 24 hours. Patient instructed to report any bleeding that saturates dressing and to take pain medication hydrocodone as directed. Dressing dry and intact to the left hip on discharge.

## 2012-09-05 NOTE — Patient Instructions (Addendum)
[facility] Discharge Instructions  RECOMMENDATIONS MADE BY THE CONSULTANT AND ANY TEST RESULTS WILL BE SENT TO YOUR REFERRING PHYSICIAN.  EXAM FINDINGS BY THE PHYSICIAN TODAY AND SIGNS OR SYMPTOMS TO REPORT TO CLINIC OR PRIMARY PHYSICIAN: You had a bone marrow biopsy and aspiration done today.  Keep the dressing dry and intact for the next 24 hours.  Take your pain medication as needed.   MEDICATIONS PRESCRIBED:  None  INSTRUCTIONS GIVEN AND DISCUSSED: If bleeding occurs apply direct pressure to site for 10 minutes.  If unable to stop bleeding go to the Emergency Department.  SPECIAL INSTRUCTIONS/FOLLOW-UP: Return for follow-up on 09/19/12.  Thank you for choosing [facility] to provide your oncology and hematology care.  To afford each patient quality time with our providers, please arrive at least 15 minutes before your scheduled appointment time.  With your help, our goal is to use those 15 minutes to complete the necessary work-up to ensure our physicians have the information they need to help with your evaluation and healthcare recommendations.    Effective January 1st, 2014, we ask that you re-schedule your appointment with our physicians should you arrive 10 or more minutes late for your appointment.  We strive to give you quality time with our providers, and arriving late affects you and other patients whose appointments are after yours.    Again, thank you for choosing [facility].  Our hope is that these requests will decrease the amount of time that you wait before being seen by our physicians.       _____________________________________________________________  I acknowledge that I have been informed and understand all the instructions given to me and received a copy. I do not have anymore questions at this time but understand that I may call the Cancer Center at [facility] at [phone] during business hours should I have any  further questions or need assistance in obtaining follow-up care.    __________________________________________  _____________  __________ Signature of Patient or Authorized Representative            Date                   Time    __________________________________________ Nurse's Signature  Bone Marrow Aspiration, Bone Marrow Biopsy Care After Read the instructions outlined below and refer to this sheet in the next few weeks. These discharge instructions provide you with general information on caring for yourself after you leave the hospital. Your caregiver may also give you specific instructions. While your treatment has been planned according to the most current medical practices available, unavoidable complications occasionally occur. If you have any problems or questions after discharge, call your caregiver. FINDING OUT THE RESULTS OF YOUR TEST Not all test results are available during your visit. If your test results are not back during the visit, make an appointment with your caregiver to find out the results. Do not assume everything is normal if you have not heard from your caregiver or the medical facility. It is important for you to follow up on all of your test results.   HOME CARE INSTRUCTIONS   You have had sedation and may be sleepy or dizzy. Your thinking may not be as clear as usual. For the next 24 hours:  Only take over-the-counter or prescription medicines for pain, discomfort, and or fever as directed by your caregiver.   Do not drink alcohol.   Do not smoke.   Do not  drive.   Do not make important legal decisions.   Do not operate heavy machinery.   Do not care for small children by yourself.   Keep your dressing clean and dry. You may replace dressing with a bandage after 24 hours.   You may take a bath or shower after 24 hours.   Use an ice pack for 20 minutes every 2 hours while awake for pain as needed.  SEEK MEDICAL CARE IF:    There is redness,  swelling, or increasing pain at the biopsy site.   There is pus coming from the biopsy site.   There is drainage from a biopsy site lasting longer than one day.   An unexplained oral temperature above 102 F (38.9 C) develops.  SEEK IMMEDIATE MEDICAL CARE IF:    You develop a rash.   You have difficulty breathing.   You develop any reaction or side effects to medications given.  Document Released: 04/02/2005 Document Revised: 12/06/2011 Document Reviewed: 09/10/2008 [facility] Patient Information 2013 [place], [place].

## 2012-09-05 NOTE — Progress Notes (Signed)
[name] Chill tolerated blood transfusions well and w/o incident.

## 2012-09-06 LAB — TYPE AND SCREEN
ABO/RH(D): A NEG
Unit division: 0

## 2012-09-07 ENCOUNTER — Encounter (INDEPENDENT_AMBULATORY_CARE_PROVIDER_SITE_OTHER): Payer: Self-pay

## 2012-09-14 ENCOUNTER — Encounter (HOSPITAL_BASED_OUTPATIENT_CLINIC_OR_DEPARTMENT_OTHER): Payer: Medicare Other

## 2012-09-14 ENCOUNTER — Other Ambulatory Visit (HOSPITAL_COMMUNITY): Payer: Self-pay | Admitting: Oncology

## 2012-09-14 ENCOUNTER — Encounter (HOSPITAL_COMMUNITY): Payer: Medicare Other

## 2012-09-14 VITALS — BP 127/44 | HR 60 | Temp 98.3°F | Resp 18

## 2012-09-14 DIAGNOSIS — D61818 Other pancytopenia: Secondary | ICD-10-CM

## 2012-09-14 DIAGNOSIS — D649 Anemia, unspecified: Secondary | ICD-10-CM

## 2012-09-14 LAB — PREPARE RBC (CROSSMATCH)

## 2012-09-14 LAB — CBC WITH DIFFERENTIAL/PLATELET
Basophils Absolute: 0.6 10*3/uL — ABNORMAL HIGH (ref 0.0–0.1)
Basophils Relative: 10 % — ABNORMAL HIGH (ref 0–1)
Eosinophils Absolute: 0.5 10*3/uL (ref 0.0–0.7)
Eosinophils Relative: 9 % — ABNORMAL HIGH (ref 0–5)
Hemoglobin: 7.3 g/dL — ABNORMAL LOW (ref 13.0–17.0)
MCH: 30.9 pg (ref 26.0–34.0)
MCHC: 33.6 g/dL (ref 30.0–36.0)
MCV: 91.9 fL (ref 78.0–100.0)
Metamyelocytes Relative: 0 %
Myelocytes: 0 %
RBC: 2.36 MIL/uL — ABNORMAL LOW (ref 4.22–5.81)

## 2012-09-14 MED ORDER — HEPARIN SOD (PORK) LOCK FLUSH 100 UNIT/ML IV SOLN
INTRAVENOUS | Status: AC
  Filled 2012-09-14: qty 5

## 2012-09-14 MED ORDER — SODIUM CHLORIDE 0.9 % IJ SOLN
10.0000 mL | INTRAMUSCULAR | Status: AC | PRN
  Administered 2012-09-14: 10 mL
  Filled 2012-09-14: qty 10

## 2012-09-14 MED ORDER — SODIUM CHLORIDE 0.9 % IV SOLN
250.0000 mL | Freq: Once | INTRAVENOUS | Status: AC
  Administered 2012-09-14: 250 mL via INTRAVENOUS

## 2012-09-14 MED ORDER — DIPHENHYDRAMINE HCL 25 MG PO CAPS
25.0000 mg | ORAL_CAPSULE | Freq: Once | ORAL | Status: AC
  Administered 2012-09-14: 25 mg via ORAL

## 2012-09-14 MED ORDER — HEPARIN SOD (PORK) LOCK FLUSH 100 UNIT/ML IV SOLN
250.0000 [IU] | INTRAVENOUS | Status: AC | PRN
  Administered 2012-09-14: 250 [IU]
  Filled 2012-09-14: qty 5

## 2012-09-14 MED ORDER — ACETAMINOPHEN 325 MG PO TABS
650.0000 mg | ORAL_TABLET | Freq: Once | ORAL | Status: AC
  Administered 2012-09-14: 650 mg via ORAL

## 2012-09-14 MED ORDER — SODIUM CHLORIDE 0.9 % IJ SOLN
INTRAMUSCULAR | Status: AC
  Filled 2012-09-14: qty 10

## 2012-09-14 NOTE — Addendum Note (Signed)
Addended by: Edythe Lynn A on: 09/14/2012 10:22 AM   Modules accepted: Orders, SmartSet

## 2012-09-14 NOTE — Addendum Note (Signed)
Addended byLeida Lauth on: 09/14/2012 10:17 AM   Modules accepted: Orders

## 2012-09-14 NOTE — Progress Notes (Signed)
Tolerated well

## 2012-09-14 NOTE — Progress Notes (Signed)
Labs drawn today for cbc/diff 

## 2012-09-15 ENCOUNTER — Encounter (HOSPITAL_BASED_OUTPATIENT_CLINIC_OR_DEPARTMENT_OTHER): Payer: Medicare Other

## 2012-09-15 VITALS — BP 123/47 | HR 63 | Temp 98.0°F | Resp 18

## 2012-09-15 DIAGNOSIS — D649 Anemia, unspecified: Secondary | ICD-10-CM

## 2012-09-15 MED ORDER — SODIUM CHLORIDE 0.9 % IJ SOLN
10.0000 mL | INTRAMUSCULAR | Status: DC | PRN
  Filled 2012-09-15: qty 10

## 2012-09-15 MED ORDER — SODIUM CHLORIDE 0.9 % IV SOLN
250.0000 mL | Freq: Once | INTRAVENOUS | Status: AC
  Administered 2012-09-15: 250 mL via INTRAVENOUS

## 2012-09-15 MED ORDER — SODIUM CHLORIDE 0.9 % IJ SOLN
INTRAMUSCULAR | Status: AC
  Filled 2012-09-15: qty 10

## 2012-09-15 NOTE — Progress Notes (Signed)
Tolerated well

## 2012-09-16 LAB — TYPE AND SCREEN
Antibody Screen: NEGATIVE
Unit division: 0

## 2012-09-19 ENCOUNTER — Encounter (HOSPITAL_BASED_OUTPATIENT_CLINIC_OR_DEPARTMENT_OTHER): Payer: Medicare Other | Admitting: Oncology

## 2012-09-19 VITALS — BP 128/55 | HR 65 | Temp 97.7°F | Resp 16 | Wt 201.5 lb

## 2012-09-19 DIAGNOSIS — D46C Myelodysplastic syndrome with isolated del(5q) chromosomal abnormality: Secondary | ICD-10-CM

## 2012-09-19 MED ORDER — LENALIDOMIDE 5 MG PO CAPS: 5.0000 mg | ORAL_CAPSULE | Freq: Every day | ORAL | Status: DC

## 2012-09-19 NOTE — Progress Notes (Signed)
Problem #1  5 q. minus syndrome His bone marrow biopsy and aspirate just shown this above-mentioned diagnosis. He is therefore an excellent candidate for Revlimid 5 mg a day 21 days on 7 days off. We will have to get it approved by the company and his insurance hopefully will pay for her. They have very little insurance and indeed had no Medicare part D. supplement.  We're going to start working on that drug right away. Hopefully he will tolerate well but knows that there are problems with the drug in some people however his sister-in-law is on the same drug for multiple myeloma and tolerates it very well.  We will have to continue to monitor his blood counts every 2 weeks and his cmet every month. We will see him in 3-4 weeks, we spent approximately 25 minutes going over the diagnosis, etc. He is ready to proceed. He feels quite good right now, has an excellent appetite and stable vital signs.

## 2012-09-19 NOTE — Patient Instructions (Addendum)
[facility] Discharge Instructions  RECOMMENDATIONS MADE BY THE CONSULTANT AND ANY TEST RESULTS WILL BE SENT TO YOUR REFERRING PHYSICIAN.  EXAM FINDINGS BY THE PHYSICIAN TODAY AND SIGNS OR SYMPTOMS TO REPORT TO CLINIC OR PRIMARY PHYSICIAN: Discussion by MD.  Bonita Quin have a type of Myelodysplastic Syndrome called 5 q minus syndrome.  We will see if we can get you started on Revlimid.  We will get our financial counselor to investigate.     INSTRUCTIONS GIVEN AND DISCUSSED: Report unusual shortness of breath or fatigue.  SPECIAL INSTRUCTIONS/FOLLOW-UP: On 09/29/12 for blood work then every 2 weeks thereafter and to see PA in 1 months.   Thank you for choosing [facility] to provide your oncology and hematology care.  To afford each patient quality time with our providers, please arrive at least 15 minutes before your scheduled appointment time.  With your help, our goal is to use those 15 minutes to complete the necessary work-up to ensure our physicians have the information they need to help with your evaluation and healthcare recommendations.    Effective January 1st, 2014, we ask that you re-schedule your appointment with our physicians should you arrive 10 or more minutes late for your appointment.  We strive to give you quality time with our providers, and arriving late affects you and other patients whose appointments are after yours.    Again, thank you for choosing [facility].  Our hope is that these requests will decrease the amount of time that you wait before being seen by our physicians.       _____________________________________________________________  I acknowledge that I have been informed and understand all the instructions given to me and received a copy. I do not have anymore questions at this time but understand that I may call the Cancer Center at [facility] at [phone] during business hours should I have any further  questions or need assistance in obtaining follow-up care.

## 2012-09-29 ENCOUNTER — Encounter (HOSPITAL_COMMUNITY): Payer: Medicare Other | Attending: Oncology

## 2012-09-29 ENCOUNTER — Encounter (HOSPITAL_COMMUNITY): Payer: Medicare Other

## 2012-09-29 VITALS — BP 136/54 | HR 64 | Temp 96.9°F | Resp 16

## 2012-09-29 DIAGNOSIS — D46C Myelodysplastic syndrome with isolated del(5q) chromosomal abnormality: Secondary | ICD-10-CM | POA: Insufficient documentation

## 2012-09-29 DIAGNOSIS — D649 Anemia, unspecified: Secondary | ICD-10-CM | POA: Insufficient documentation

## 2012-09-29 LAB — CBC WITH DIFFERENTIAL/PLATELET
Basophils Relative: 14 % — ABNORMAL HIGH (ref 0–1)
Eosinophils Relative: 12 % — ABNORMAL HIGH (ref 0–5)
HCT: 20.4 % — ABNORMAL LOW (ref 39.0–52.0)
Hemoglobin: 7 g/dL — ABNORMAL LOW (ref 13.0–17.0)
MCHC: 34.3 g/dL (ref 30.0–36.0)
MCV: 88.7 fL (ref 78.0–100.0)
Monocytes Absolute: 0.3 10*3/uL (ref 0.1–1.0)
Monocytes Relative: 5 % (ref 3–12)
Neutro Abs: 2.1 10*3/uL (ref 1.7–7.7)

## 2012-09-29 MED ORDER — SODIUM CHLORIDE 0.9 % IV SOLN
250.0000 mL | Freq: Once | INTRAVENOUS | Status: AC
  Administered 2012-09-29: 250 mL via INTRAVENOUS

## 2012-09-29 MED ORDER — SODIUM CHLORIDE 0.9 % IJ SOLN
10.0000 mL | INTRAMUSCULAR | Status: AC | PRN
  Administered 2012-09-29: 10 mL
  Filled 2012-09-29: qty 10

## 2012-09-29 NOTE — Progress Notes (Signed)
Tolerated transfusion well.  D/C to home accompanied by spouse.

## 2012-09-29 NOTE — Progress Notes (Signed)
Labs drawn today for cbc/diff 

## 2012-09-30 LAB — TYPE AND SCREEN
ABO/RH(D): A NEG
Antibody Screen: NEGATIVE

## 2012-10-06 ENCOUNTER — Telehealth (HOSPITAL_COMMUNITY): Payer: Self-pay

## 2012-10-06 NOTE — Telephone Encounter (Signed)
Discussed with wife that per Dr. Mariel Sleet patient needs to start revlimid as soon as he gets it and to keep his lab and follow-up appointments as scheduled.  Verbalized understanding.

## 2012-10-06 NOTE — Telephone Encounter (Signed)
Call from [facility], Revlimid will be in on Monday.  Wants to know when he should start it and should we check blood work before he starts it?  States "he's weak but not short of breath."

## 2012-10-13 ENCOUNTER — Encounter (HOSPITAL_BASED_OUTPATIENT_CLINIC_OR_DEPARTMENT_OTHER): Payer: Medicare Other

## 2012-10-13 ENCOUNTER — Encounter (HOSPITAL_COMMUNITY): Payer: Medicare Other

## 2012-10-13 DIAGNOSIS — D649 Anemia, unspecified: Secondary | ICD-10-CM

## 2012-10-13 LAB — DIFFERENTIAL
Lymphs Abs: 1.3 10*3/uL (ref 0.7–4.0)
Monocytes Absolute: 0.2 10*3/uL (ref 0.1–1.0)
Monocytes Relative: 4 % (ref 3–12)
Neutro Abs: 2.1 10*3/uL (ref 1.7–7.7)
Neutrophils Relative %: 40 % — ABNORMAL LOW (ref 43–77)

## 2012-10-13 LAB — CBC
HCT: 20.4 % — ABNORMAL LOW (ref 39.0–52.0)
Hemoglobin: 6.9 g/dL — CL (ref 13.0–17.0)
MCHC: 33.8 g/dL (ref 30.0–36.0)
RBC: 2.34 MIL/uL — ABNORMAL LOW (ref 4.22–5.81)

## 2012-10-13 MED ORDER — SODIUM CHLORIDE 0.9 % IJ SOLN
10.0000 mL | INTRAMUSCULAR | Status: AC | PRN
  Administered 2012-10-13: 10 mL
  Filled 2012-10-13: qty 10

## 2012-10-13 MED ORDER — SODIUM CHLORIDE 0.9 % IV SOLN
250.0000 mL | Freq: Once | INTRAVENOUS | Status: AC
  Administered 2012-10-13: 250 mL via INTRAVENOUS

## 2012-10-13 NOTE — Progress Notes (Signed)
Labs drawn today for cbc/diff 

## 2012-10-13 NOTE — Progress Notes (Signed)
Received 2 units of packed red blood cells today.  Tolerated well.

## 2012-10-13 NOTE — Addendum Note (Signed)
Addended by: Corena Herter D on: 10/13/2012 09:34 AM   Modules accepted: Orders, SmartSet

## 2012-10-13 NOTE — Addendum Note (Signed)
Addended byLeida Lauth on: 10/13/2012 09:38 AM   Modules accepted: Orders

## 2012-10-14 LAB — TYPE AND SCREEN
Antibody Screen: NEGATIVE
Unit division: 0

## 2012-10-17 ENCOUNTER — Encounter (HOSPITAL_COMMUNITY): Payer: Self-pay | Admitting: Oncology

## 2012-10-17 ENCOUNTER — Encounter (HOSPITAL_BASED_OUTPATIENT_CLINIC_OR_DEPARTMENT_OTHER): Payer: Medicare Other | Admitting: Oncology

## 2012-10-17 ENCOUNTER — Telehealth (HOSPITAL_COMMUNITY): Payer: Self-pay | Admitting: *Deleted

## 2012-10-17 VITALS — BP 107/47 | HR 69 | Resp 18 | Wt 206.8 lb

## 2012-10-17 DIAGNOSIS — D649 Anemia, unspecified: Secondary | ICD-10-CM

## 2012-10-17 DIAGNOSIS — D46C Myelodysplastic syndrome with isolated del(5q) chromosomal abnormality: Secondary | ICD-10-CM

## 2012-10-17 DIAGNOSIS — D696 Thrombocytopenia, unspecified: Secondary | ICD-10-CM

## 2012-10-17 HISTORY — DX: Myelodysplastic syndrome with isolated del(5q) chromosomal abnormality: D46.C

## 2012-10-17 NOTE — Telephone Encounter (Signed)
Pt notified to take ASA 81 mg BID.

## 2012-10-17 NOTE — Progress Notes (Addendum)
Caleb Maudlin, MD [address] [place] [address]  1. Myelodysplastic syndrome with 5 q minus  CBC with Differential, Comprehensive metabolic panel    CURRENT THERAPY: Revlimid 5 mg daily, 21 days on and 7 days off  INTERVAL HISTORY: [name] 79 y.o. male returns for  regular  visit for followup of MDS with 5 q- syndrome.  On Revlimid 5 mg daily 21 days on and 7 days off.  [name] started his next cycle of Revlimid on Oct 09, 2012.  He is taking Revlimid daily 21 days on and 7 days off.    He is tolerating therapy very well.  He denies any nausea or vomiting.    He asked [place] how he is doing with regards to his MDS. His platelets are stable.  His anemia continues to be an issue, requiring a blood transfusion.  Otherwise, he is doing well.   I personally reviewed and went over laboratory results with the patient.  He asked if he can consume grapefruit with his Revlimid and I could not find any reason why he could not.    Hematologically, he is doing well and denies any complaints.   Past Medical History  Diagnosis Date  . Hypertension   . COPD (chronic obstructive pulmonary disease)   . CAD (coronary artery disease)     s/p 4V CABG '96, Myoview '10 w/o ischemia  . Arthritis   . Atrial fibrillation     coumadin stopped 12/2011 2/2 anemia  . Dysphagia   . Anemia     requiring blood transfusions  . Carotid stenosis     Dr Waverly Ferrari follows.   . Diabetes mellitus, type 2     A1C 6.7% 12/2011  . HLD (hyperlipidemia)     LDL 65 12/2011, intolerant to statins  . Hematuria     felt 2/2 foley insertion  . LV dysfunction     EF 45-50% by echo 2007  . Melena     EGD & Colonoscopy 09/2011 revealed gastritis, erosions, scars, diverticula, 8 colon polyps (largest 1.6cm, not removed 2/2 anticoagulation), small AVM in transverse colon  . History of tobacco abuse     quit 1992  . Anxiety   . Shortness of breath     exertion  . GERD  (gastroesophageal reflux disease)   . Myocardial infarction   . Thrombocytopenia 09/01/2012  . Myelodysplastic syndrome with 5 q minus 10/17/2012    On Revlimid 5 mg daily 21 days on and 7 days off    has DYSLIPIDEMIA; CAD; Campath-induced atrial fibrillation; PVD; OSTEOARTHRITIS; CAROTID ARTERY STENOSIS; Long term current use of anticoagulant; GI bleeding; AVM (arteriovenous malformation) of colon without hemorrhage; Angina effort; Anemia; History of colonic polyps; and Myelodysplastic syndrome with 5 q minus on his problem list.     is allergic to statins and tape.  [name] does not currently have medications on file.  Past Surgical History  Procedure Date  . Knee surgery 1960    Right knee cartilage  . Rotator cuff repair 1990    right   . Inguinal hernia repair 2000 and 2010    left  . Cholecystectomy 05/2010    Lap chole with biologic mesh repair/reinforcement by  Dr Zachery Dakins  . Back surgery 02/2006    ruptured disk,  post op diskitis infection requiring prolonged hospital stay and  surgical I & D  . Tonsillectomy   . Esophagogastroduodenoscopy 10/08/2011    Procedure: ESOPHAGOGASTRODUODENOSCOPY (EGD);  Surgeon:  Malissa Hippo, MD;  [facility];  Service: Endoscopy;  Laterality: N/A;  . Colonoscopy 10/08/2011    Procedure: COLONOSCOPY;  Surgeon: Malissa Hippo, MD;  [facility];  Service: Endoscopy;  Laterality: N/A;  . Cataract extraction, bilateral   . Peg placement 07/2006    placed due to prolonged infection, poor po intake, malnutrition during several week hospital stay resulting from ruptured disc that became infected following surgery  . Colonoscopy 01/14/2012    Procedure: COLONOSCOPY;  Surgeon: Malissa Hippo, MD;  [facility]: [facility];  Service: Endoscopy;  Laterality: N/A;  945  . Coronary artery bypass graft 1996    4 by-pass  . Ventral hernia repair   . Colonoscopy 04/20/2012    Procedure: COLONOSCOPY;  Surgeon: Malissa Hippo, MD;   [facility];  Service: Endoscopy;  Laterality: N/A;  1030  . Pr vein bypass graft,aorto-fem-pop 1996  . Spine surgery   . Joint replacement 1960    Right Knee  . Lymph node biopsy 08/15/2012    Procedure: LYMPH NODE BIOPSY;  Surgeon: Marlane Hatcher, MD;  Location: [facility];  Service: General;  Laterality: Left;  Cervical Lymph Node Bx in Minor Room  . Bone marrow biopsy   . Bone marrow aspiration     Denies any headaches, dizziness, double vision, fevers, chills, night sweats, nausea, vomiting, diarrhea, constipation, chest pain, heart palpitations, shortness of breath, blood in stool, black tarry stool, urinary pain, urinary burning, urinary frequency, hematuria.   PHYSICAL EXAMINATION  ECOG PERFORMANCE STATUS: 1 - Symptomatic but completely ambulatory  Filed Vitals:   10/17/12 0949  BP: 107/47  Pulse: 69  Resp: 18    GENERAL:alert, no distress, well nourished, well developed, comfortable, cooperative, obese and smiling SKIN: skin color, texture, turgor are normal, no rashes or significant lesions HEAD: Normocephalic, No masses, lesions, tenderness or abnormalities EYES: normal, Conjunctiva are pink and non-injected EARS: External ears normal OROPHARYNX:mucous membranes are moist  NECK: supple, no adenopathy, thyroid normal size, non-tender, without nodularity, no stridor, non-tender, trachea midline, multiple sebaceous cysts. LYMPH:  no palpable lymphadenopathy BREAST:not examined LUNGS: clear to auscultation and percussion HEART: regular rate & rhythm, no murmurs, no gallops, S1 normal and S2 normal ABDOMEN:abdomen soft, non-tender and normal bowel sounds BACK: Back symmetric, no curvature. EXTREMITIES:less then 2 second capillary refill, no joint deformities, effusion, or inflammation, no edema, no skin discoloration, no clubbing, no cyanosis  NEURO: alert & oriented x 3 with fluent speech, no focal motor/sensory deficits, gait normal    LABORATORY  DATA: CBC    Component Value Date/Time   WBC 5.3 10/13/2012 0842   RBC 2.34* 10/13/2012 0842   HGB 6.9* 10/13/2012 0842   HCT 20.4* 10/13/2012 0842   PLT 114* 10/13/2012 0842   MCV 87.2 10/13/2012 0842   MCH 29.5 10/13/2012 0842   MCHC 33.8 10/13/2012 0842   RDW 16.3* 10/13/2012 0842   LYMPHSABS 1.3 10/13/2012 0842   MONOABS 0.2 10/13/2012 0842   EOSABS 1.0* 10/13/2012 0842   BASOSABS 0.7* 10/13/2012 0842     PATHOLOGY: 09/05/2012  Diagnosis Bone Marrow, Aspirate,Biopsy, and Clot, left - HYPERCELLULAR BONE MARROW FOR AGE WITH MARKED ERYTHROID HYPOPLASIA AND DYSPOIETIC CHENGES. - SEE COMMENT. PERIPHERAL BLOOD: - MACROCYTIC ANEMIA. - BASOPHILIA - THROMBOCYTOPENIA. Diagnosis Note The bone marrow shows marked erythroid hypoplasia associated with dyspoietic changes primarily involving megakaryocytes with predominance of hypolobated/unilobated forms. No increase in blastic cells identified. The overall changes strongly raise the possibility  of 5q- abnormality despite a somewhat limited material and hence correlation with cytogenetic studies is strongly recommended. (BNS:eps 09/07/12) Guerry Bruin MD Pathologist, Electronic Signature (Case signed 09/07/2012)    ASSESSMENT:  1. MDS with 5 q- syndrome.  On Revlimid 5 mg daily, 21 days on and 7 days off. Started his next cycle on 10/09/2012. 2. Anemia, S/P 2 unit PRBC transfusion on 10/13/2012 for Hgb of 6.9. 3. Thrombocytopenia, stable   PLAN:  1. I personally reviewed and went over laboratory results with the patient. 2. Labs every 2 weeks: CBC diff 3. Labs every 4 weeks. : CMET 4. Continue Revlimid as directed. 5. Return in 4 weeks for follow-up   All questions were answered. The patient knows to call the clinic with any problems, questions or concerns. We can certainly see the patient much sooner if necessary.  The patient and plan discussed with Glenford Peers, MD and he is in agreement with the  aforementioned.  [name]   Addendum: I will ask the nurse to contact the patient and request he take his ASA 81 mg BID.  [name]

## 2012-10-17 NOTE — Patient Instructions (Addendum)
[facility] Discharge Instructions  RECOMMENDATIONS MADE BY THE CONSULTANT AND ANY TEST RESULTS WILL BE SENT TO YOUR REFERRING PHYSICIAN.  EXAM FINDINGS BY THE PHYSICIAN TODAY AND SIGNS OR SYMPTOMS TO REPORT TO CLINIC OR PRIMARY PHYSICIAN: Exam and discussion by PA.  You are doing well.    MEDICATIONS PRESCRIBED:  Revlimid you should take for 21 days then off 7 days.  You can eat grapefruit with Revlimid.  INSTRUCTIONS GIVEN AND DISCUSSED: Report increased shortness of breath or increased fatigue, etc.  SPECIAL INSTRUCTIONS/FOLLOW-UP: Blood work every 2 weeks and to be seen in follow-up in 4 weeks.  Thank you for choosing [facility] to provide your oncology and hematology care.  To afford each patient quality time with our providers, please arrive at least 15 minutes before your scheduled appointment time.  With your help, our goal is to use those 15 minutes to complete the necessary work-up to ensure our physicians have the information they need to help with your evaluation and healthcare recommendations.    Effective January 1st, 2014, we ask that you re-schedule your appointment with our physicians should you arrive 10 or more minutes late for your appointment.  We strive to give you quality time with our providers, and arriving late affects you and other patients whose appointments are after yours.    Again, thank you for choosing [facility].  Our hope is that these requests will decrease the amount of time that you wait before being seen by our physicians.       _____________________________________________________________  Should you have questions after your visit to [facility], please contact our office at [phone] between the hours of 8:30 a.m. and 5:00 p.m.  Voicemails left after 4:30 p.m. will not be returned until the following business day.  For prescription refill requests, have your pharmacy contact our office  with your prescription refill request.

## 2012-10-17 NOTE — Telephone Encounter (Signed)
Message copied by Adelene Amas on Tue Oct 17, 2012  4:24 PM ------      Message from: Ellouise Newer      Created: Tue Oct 17, 2012 11:02 AM       Ask patient to take ASA 81 mg BID

## 2012-10-20 ENCOUNTER — Encounter: Payer: Self-pay | Admitting: Oncology

## 2012-10-23 ENCOUNTER — Emergency Department (HOSPITAL_COMMUNITY): Payer: Medicare Other

## 2012-10-23 ENCOUNTER — Inpatient Hospital Stay (HOSPITAL_COMMUNITY)
Admission: EM | Admit: 2012-10-23 | Discharge: 2012-10-25 | DRG: 811 | Disposition: A | Discharge status: Home / Self Care | Payer: Medicare Other | Attending: Pulmonary Disease | Admitting: Pulmonary Disease

## 2012-10-23 ENCOUNTER — Encounter (HOSPITAL_COMMUNITY): Payer: Self-pay | Admitting: *Deleted

## 2012-10-23 DIAGNOSIS — Z7982 Long term (current) use of aspirin: Secondary | ICD-10-CM

## 2012-10-23 DIAGNOSIS — Z87891 Personal history of nicotine dependence: Secondary | ICD-10-CM

## 2012-10-23 DIAGNOSIS — D696 Thrombocytopenia, unspecified: Secondary | ICD-10-CM | POA: Diagnosis present

## 2012-10-23 DIAGNOSIS — N189 Chronic kidney disease, unspecified: Secondary | ICD-10-CM | POA: Diagnosis present

## 2012-10-23 DIAGNOSIS — Z79899 Other long term (current) drug therapy: Secondary | ICD-10-CM

## 2012-10-23 DIAGNOSIS — I5023 Acute on chronic systolic (congestive) heart failure: Secondary | ICD-10-CM | POA: Diagnosis present

## 2012-10-23 DIAGNOSIS — I252 Old myocardial infarction: Secondary | ICD-10-CM

## 2012-10-23 DIAGNOSIS — M129 Arthropathy, unspecified: Secondary | ICD-10-CM | POA: Diagnosis present

## 2012-10-23 DIAGNOSIS — E119 Type 2 diabetes mellitus without complications: Secondary | ICD-10-CM | POA: Diagnosis present

## 2012-10-23 DIAGNOSIS — E785 Hyperlipidemia, unspecified: Secondary | ICD-10-CM | POA: Diagnosis present

## 2012-10-23 DIAGNOSIS — I6529 Occlusion and stenosis of unspecified carotid artery: Secondary | ICD-10-CM | POA: Diagnosis present

## 2012-10-23 DIAGNOSIS — I5083 High output heart failure: Secondary | ICD-10-CM | POA: Diagnosis present

## 2012-10-23 DIAGNOSIS — J449 Chronic obstructive pulmonary disease, unspecified: Secondary | ICD-10-CM | POA: Diagnosis present

## 2012-10-23 DIAGNOSIS — Z951 Presence of aortocoronary bypass graft: Secondary | ICD-10-CM

## 2012-10-23 DIAGNOSIS — F411 Generalized anxiety disorder: Secondary | ICD-10-CM | POA: Diagnosis present

## 2012-10-23 DIAGNOSIS — I739 Peripheral vascular disease, unspecified: Secondary | ICD-10-CM | POA: Diagnosis present

## 2012-10-23 DIAGNOSIS — I2510 Atherosclerotic heart disease of native coronary artery without angina pectoris: Secondary | ICD-10-CM | POA: Diagnosis present

## 2012-10-23 DIAGNOSIS — I4891 Unspecified atrial fibrillation: Secondary | ICD-10-CM | POA: Diagnosis present

## 2012-10-23 DIAGNOSIS — I509 Heart failure, unspecified: Secondary | ICD-10-CM | POA: Diagnosis present

## 2012-10-23 DIAGNOSIS — K219 Gastro-esophageal reflux disease without esophagitis: Secondary | ICD-10-CM | POA: Diagnosis present

## 2012-10-23 DIAGNOSIS — I129 Hypertensive chronic kidney disease with stage 1 through stage 4 chronic kidney disease, or unspecified chronic kidney disease: Secondary | ICD-10-CM | POA: Diagnosis present

## 2012-10-23 DIAGNOSIS — D759 Disease of blood and blood-forming organs, unspecified: Secondary | ICD-10-CM | POA: Diagnosis present

## 2012-10-23 DIAGNOSIS — D649 Anemia, unspecified: Principal | ICD-10-CM | POA: Diagnosis present

## 2012-10-23 DIAGNOSIS — E876 Hypokalemia: Secondary | ICD-10-CM | POA: Diagnosis present

## 2012-10-23 DIAGNOSIS — J4489 Other specified chronic obstructive pulmonary disease: Secondary | ICD-10-CM | POA: Diagnosis present

## 2012-10-23 DIAGNOSIS — D46C Myelodysplastic syndrome with isolated del(5q) chromosomal abnormality: Secondary | ICD-10-CM | POA: Diagnosis present

## 2012-10-23 LAB — CBC WITH DIFFERENTIAL/PLATELET
Basophils Relative: 9 % — ABNORMAL HIGH (ref 0–1)
Basophils Relative: 10 % — ABNORMAL HIGH (ref 0–1)
Eosinophils Absolute: 0.6 10*3/uL (ref 0.0–0.7)
Eosinophils Relative: 15 % — ABNORMAL HIGH (ref 0–5)
Eosinophils Relative: 13 % — ABNORMAL HIGH (ref 0–5)
HCT: 18.6 % — ABNORMAL LOW (ref 39.0–52.0)
HCT: 24.4 % — ABNORMAL LOW (ref 39.0–52.0)
Hemoglobin: 6.3 g/dL — CL (ref 13.0–17.0)
Hemoglobin: 8.5 g/dL — ABNORMAL LOW (ref 13.0–17.0)
MCH: 29.6 pg (ref 26.0–34.0)
MCH: 30.1 pg (ref 26.0–34.0)
MCHC: 33.9 g/dL (ref 30.0–36.0)
MCV: 87.3 fL (ref 78.0–100.0)
MCV: 86.5 fL (ref 78.0–100.0)
Monocytes Absolute: 0.2 10*3/uL (ref 0.1–1.0)
Monocytes Absolute: 0.2 10*3/uL (ref 0.1–1.0)
Monocytes Relative: 4 % (ref 3–12)
Neutro Abs: 1.1 10*3/uL — ABNORMAL LOW (ref 1.7–7.7)
Neutro Abs: 2.4 10*3/uL (ref 1.7–7.7)
Neutrophils Relative %: 52 % (ref 43–77)
RBC: 2.82 MIL/uL — ABNORMAL LOW (ref 4.22–5.81)

## 2012-10-23 LAB — BASIC METABOLIC PANEL
BUN: 22 mg/dL (ref 6–23)
CO2: 25 mEq/L (ref 19–32)
Chloride: 106 mEq/L (ref 96–112)
Creatinine, Ser: 1.4 mg/dL — ABNORMAL HIGH (ref 0.50–1.35)
Glucose, Bld: 151 mg/dL — ABNORMAL HIGH (ref 70–99)
Potassium: 3.3 mEq/L — ABNORMAL LOW (ref 3.5–5.1)

## 2012-10-23 LAB — PREPARE RBC (CROSSMATCH)

## 2012-10-23 LAB — PRO B NATRIURETIC PEPTIDE: Pro B Natriuretic peptide (BNP): 3246 pg/mL — ABNORMAL HIGH (ref 0–450)

## 2012-10-23 MED ORDER — MORPHINE SULFATE 2 MG/ML IJ SOLN
2.0000 mg | Freq: Once | INTRAMUSCULAR | Status: AC
  Administered 2012-10-23: 2 mg via INTRAVENOUS
  Filled 2012-10-23: qty 1

## 2012-10-23 MED ORDER — ACETAMINOPHEN 325 MG PO TABS: 650.0000 mg | ORAL_TABLET | Freq: Four times a day (QID) | ORAL | Status: DC | PRN

## 2012-10-23 MED ORDER — TAMSULOSIN HCL 0.4 MG PO CAPS
0.4000 mg | ORAL_CAPSULE | Freq: Every day | ORAL | Status: DC
  Administered 2012-10-24: 0.4 mg via ORAL
  Filled 2012-10-23: qty 1

## 2012-10-23 MED ORDER — MORPHINE SULFATE 2 MG/ML IJ SOLN: 2.0000 mg | INTRAMUSCULAR | Status: DC | PRN

## 2012-10-23 MED ORDER — HYDROCODONE-ACETAMINOPHEN 5-325 MG PO TABS: 1.0000 | ORAL_TABLET | ORAL | Status: DC | PRN

## 2012-10-23 MED ORDER — ACETAMINOPHEN 325 MG PO TABS
650.0000 mg | ORAL_TABLET | Freq: Once | ORAL | Status: AC
  Administered 2012-10-23: 650 mg via ORAL
  Filled 2012-10-23: qty 2

## 2012-10-23 MED ORDER — CHLORHEXIDINE GLUCONATE 0.12 % MT SOLN
15.0000 mL | Freq: Two times a day (BID) | OROMUCOSAL | Status: DC
  Administered 2012-10-23 – 2012-10-25 (×4): 15 mL via OROMUCOSAL
  Filled 2012-10-23 (×4): qty 15

## 2012-10-23 MED ORDER — SODIUM CHLORIDE 0.9 % IJ SOLN
3.0000 mL | Freq: Two times a day (BID) | INTRAMUSCULAR | Status: DC
  Administered 2012-10-23 – 2012-10-25 (×4): 3 mL via INTRAVENOUS

## 2012-10-23 MED ORDER — ACETAMINOPHEN 650 MG RE SUPP: 650.0000 mg | Freq: Four times a day (QID) | RECTAL | Status: DC | PRN

## 2012-10-23 MED ORDER — DIPHENHYDRAMINE HCL 25 MG PO CAPS
25.0000 mg | ORAL_CAPSULE | Freq: Once | ORAL | Status: AC
  Administered 2012-10-23: 25 mg via ORAL
  Filled 2012-10-23: qty 1

## 2012-10-23 MED ORDER — AMLODIPINE BESYLATE 5 MG PO TABS
5.0000 mg | ORAL_TABLET | Freq: Every day | ORAL | Status: DC
  Administered 2012-10-23 – 2012-10-25 (×3): 5 mg via ORAL
  Filled 2012-10-23 (×3): qty 1

## 2012-10-23 MED ORDER — FUROSEMIDE 10 MG/ML IJ SOLN
20.0000 mg | Freq: Once | INTRAMUSCULAR | Status: AC
  Administered 2012-10-23: 20 mg via INTRAVENOUS
  Filled 2012-10-23: qty 2

## 2012-10-23 MED ORDER — ALUM & MAG HYDROXIDE-SIMETH 200-200-20 MG/5ML PO SUSP: 30.0000 mL | Freq: Four times a day (QID) | ORAL | Status: DC | PRN

## 2012-10-23 MED ORDER — ALBUTEROL SULFATE (5 MG/ML) 0.5% IN NEBU
5.0000 mg | INHALATION_SOLUTION | Freq: Once | RESPIRATORY_TRACT | Status: AC
  Administered 2012-10-23: 5 mg via RESPIRATORY_TRACT

## 2012-10-23 MED ORDER — ONDANSETRON HCL 4 MG/2ML IJ SOLN: 4.0000 mg | Freq: Four times a day (QID) | INTRAMUSCULAR | Status: DC | PRN

## 2012-10-23 MED ORDER — BIOTENE DRY MOUTH MT LIQD
15.0000 mL | Freq: Two times a day (BID) | OROMUCOSAL | Status: DC
  Administered 2012-10-24 (×2): 15 mL via OROMUCOSAL

## 2012-10-23 MED ORDER — ZOLPIDEM TARTRATE 5 MG PO TABS: 5.0000 mg | ORAL_TABLET | Freq: Every evening | ORAL | Status: DC | PRN

## 2012-10-23 MED ORDER — NITROGLYCERIN 0.4 MG SL SUBL: 0.4000 mg | SUBLINGUAL_TABLET | SUBLINGUAL | Status: DC | PRN

## 2012-10-23 MED ORDER — DOCUSATE SODIUM 100 MG PO CAPS
100.0000 mg | ORAL_CAPSULE | Freq: Two times a day (BID) | ORAL | Status: DC
  Administered 2012-10-23 – 2012-10-25 (×5): 100 mg via ORAL
  Filled 2012-10-23 (×6): qty 1

## 2012-10-23 MED ORDER — AMIODARONE HCL 200 MG PO TABS
100.0000 mg | ORAL_TABLET | Freq: Every day | ORAL | Status: DC
  Administered 2012-10-23 – 2012-10-25 (×3): 100 mg via ORAL
  Filled 2012-10-23 (×3): qty 1

## 2012-10-23 MED ORDER — DIAZEPAM 5 MG PO TABS: 5.0000 mg | ORAL_TABLET | Freq: Four times a day (QID) | ORAL | Status: DC | PRN

## 2012-10-23 MED ORDER — FUROSEMIDE 10 MG/ML IJ SOLN
20.0000 mg | Freq: Once | INTRAMUSCULAR | Status: AC
  Administered 2012-10-24: 20 mg via INTRAVENOUS
  Filled 2012-10-23: qty 2

## 2012-10-23 MED ORDER — METOPROLOL TARTRATE 25 MG PO TABS
25.0000 mg | ORAL_TABLET | Freq: Two times a day (BID) | ORAL | Status: DC
  Administered 2012-10-23 – 2012-10-25 (×5): 25 mg via ORAL
  Filled 2012-10-23 (×5): qty 1

## 2012-10-23 MED ORDER — LENALIDOMIDE 5 MG PO CAPS
5.0000 mg | ORAL_CAPSULE | Freq: Every day | ORAL | Status: DC
  Administered 2012-10-23 – 2012-10-25 (×3): 5 mg via ORAL
  Filled 2012-10-23 (×3): qty 1

## 2012-10-23 MED ORDER — ONDANSETRON HCL 4 MG PO TABS: 4.0000 mg | ORAL_TABLET | Freq: Four times a day (QID) | ORAL | Status: DC | PRN

## 2012-10-23 MED ORDER — FUROSEMIDE 10 MG/ML IJ SOLN
40.0000 mg | Freq: Once | INTRAMUSCULAR | Status: AC
  Administered 2012-10-23: 40 mg via INTRAVENOUS
  Filled 2012-10-23: qty 4

## 2012-10-23 MED ORDER — PREGABALIN 75 MG PO CAPS
75.0000 mg | ORAL_CAPSULE | Freq: Once | ORAL | Status: AC
  Administered 2012-10-23: 75 mg via ORAL
  Filled 2012-10-23: qty 1

## 2012-10-23 MED ORDER — METFORMIN HCL 500 MG PO TABS
500.0000 mg | ORAL_TABLET | Freq: Two times a day (BID) | ORAL | Status: DC
  Administered 2012-10-24: 500 mg via ORAL
  Filled 2012-10-23 (×2): qty 1

## 2012-10-23 MED ORDER — ALBUTEROL SULFATE (5 MG/ML) 0.5% IN NEBU
INHALATION_SOLUTION | RESPIRATORY_TRACT | Status: AC
  Administered 2012-10-23: 5 mg via RESPIRATORY_TRACT
  Filled 2012-10-23: qty 1

## 2012-10-23 NOTE — Plan of Care (Signed)
Problem: Phase I Progression Outcomes Goal: OOB as tolerated unless otherwise ordered Outcome: Progressing Up with assist Goal: Initial discharge plan identified Outcome: Progressing Plan to d/c home with wife Goal: Hemodynamically stable Outcome: Progressing Received 2 units prbc

## 2012-10-23 NOTE — ED Notes (Signed)
Report called to Cicely, RN on unit 300.  

## 2012-10-23 NOTE — ED Notes (Signed)
Attempted to ambulate pt. Pt became shaky and unstable upon standing.

## 2012-10-23 NOTE — ED Notes (Signed)
Pt arrived from home d/t sob. Pts wife reports to ems that pt has had chemo therapy he gets sob.

## 2012-10-23 NOTE — H&P (Signed)
[name] MRN: [id number] DOB/AGE: 05-26-1932 79 y.o. Primary Care Physician:Arliene Rosenow L, MD Admit date: 10/23/2012 Chief Complaint: Chest pressure/shortness of breath HPI: This is a 79 year old with a history of a blood dyscrasia chronic atrial fibrillation COPD and coronary artery occlusive disease. He has diabetes and peripheral arterial disease as well. He had been in his usual state of fairly poor health having recently started a chemotherapeutic agent for his blood dyscrasia. His hemoglobin has been running low. He said he felt well until last night and then started feeling like he had chest pressure and he was extremely short of breath. He's had some edema of his legs as well. He took a nitroglycerin but it did not help. He then called EMS and was brought to the emergency room. In the emergency room he was noted to be shaky and unstable when he ambulated short of breath with ambulation and had evidence of CHF bilateral to her work and chest x-ray.  Past Medical History  Diagnosis Date  . Hypertension   . COPD (chronic obstructive pulmonary disease)   . CAD (coronary artery disease)     s/p 4V CABG '96, Myoview '10 w/o ischemia  . Arthritis   . Atrial fibrillation     coumadin stopped 12/2011 2/2 anemia  . Dysphagia   . Anemia     requiring blood transfusions  . Carotid stenosis     Dr Waverly Ferrari follows.   . Diabetes mellitus, type 2     A1C 6.7% 12/2011  . HLD (hyperlipidemia)     LDL 65 12/2011, intolerant to statins  . Hematuria     felt 2/2 foley insertion  . LV dysfunction     EF 45-50% by echo 2007  . Melena     EGD & Colonoscopy 09/2011 revealed gastritis, erosions, scars, diverticula, 8 colon polyps (largest 1.6cm, not removed 2/2 anticoagulation), small AVM in transverse colon  . History of tobacco abuse     quit 1992  . Anxiety   . Shortness of breath     exertion  . GERD (gastroesophageal reflux disease)   . Myocardial infarction   .  Thrombocytopenia 09/01/2012  . Myelodysplastic syndrome with 5 q minus 10/17/2012    On Revlimid 5 mg daily 21 days on and 7 days off   Past Surgical History  Procedure Date  . Knee surgery 1960    Right knee cartilage  . Rotator cuff repair 1990    right   . Inguinal hernia repair 2000 and 2010    left  . Cholecystectomy 05/2010    Lap chole with biologic mesh repair/reinforcement by  Dr Zachery Dakins  . Back surgery 02/2006    ruptured disk,  post op diskitis infection requiring prolonged hospital stay and  surgical I & D  . Tonsillectomy   . Esophagogastroduodenoscopy 10/08/2011    Procedure: ESOPHAGOGASTRODUODENOSCOPY (EGD);  Surgeon: Malissa Hippo, MD;  [facility];  Service: Endoscopy;  Laterality: N/A;  . Colonoscopy 10/08/2011    Procedure: COLONOSCOPY;  Surgeon: Malissa Hippo, MD;  [facility];  Service: Endoscopy;  Laterality: N/A;  . Cataract extraction, bilateral   . Peg placement 07/2006    placed due to prolonged infection, poor po intake, malnutrition during several week hospital stay resulting from ruptured disc that became infected following surgery  . Colonoscopy 01/14/2012    Procedure: COLONOSCOPY;  Surgeon: Malissa Hippo, MD;  [facility];  Service: Endoscopy;  Laterality: N/A;  945  .  Coronary artery bypass graft 1996    4 by-pass  . Ventral hernia repair   . Colonoscopy 04/20/2012    Procedure: COLONOSCOPY;  Surgeon: Malissa Hippo, MD;  [facility];  Service: Endoscopy;  Laterality: N/A;  1030  . Pr vein bypass graft,aorto-fem-pop 1996  . Spine surgery   . Joint replacement 1960    Right Knee  . Lymph node biopsy 08/15/2012    Procedure: LYMPH NODE BIOPSY;  Surgeon: Marlane Hatcher, MD;  Location: [facility];  Service: General;  Laterality: Left;  Cervical Lymph Node Bx in Minor Room  . Bone marrow biopsy   . Bone marrow aspiration         Family History  Problem Relation Age of Onset  . Heart disease  Sister   . Cancer Sister   . Hyperlipidemia Sister   . Hypertension Sister   . Heart disease Brother     Heart Diseast before age 42  . Coronary artery disease Mother   . Cancer Father     stomach  . Anesthesia problems Neg Hx   . Hypotension Neg Hx   . Malignant hyperthermia Neg Hx   . Pseudochol deficiency Neg Hx     Social History:  reports that he quit smoking about 22 years ago. His smoking use included Cigarettes. He has a 80 pack-year smoking history. He has never used smokeless tobacco. He reports that he does not drink alcohol or use illicit drugs.   Allergies:  Allergies  Allergen Reactions  . Statins Other (See Comments)    Causes legs to hurt.   . Tape     Causes skin to peel off.      (Not in a hospital admission)     ZOX:WRUEA from the symptoms mentioned above,there are no other symptoms referable to all systems reviewed.  Physical Exam: Blood pressure 139/58, pulse 63, temperature 98 F (36.7 C), temperature source Oral, resp. rate 24, height 5\' 10"  (1.778 m), weight 93.441 kg (206 lb), SpO2 96.00%. He is awake and alert. His pupils are reactive. His nose and throat are clear. His neck is supple. He has some jugular venous distention while he is lying flat. His heart is somewhat irregular. I don't hear an S3 gallop. His chest shows fine rales in the bases bilaterally. His abdomen is soft. Extremities show trace edema to 1+ edema bilaterally in the lower extremities particularly around his ankles. Central nervous system exam shows very hard of hearing but otherwise negative    Basename 10/23/12 0534  WBC 3.7*  NEUTROABS 1.1*  HGB 6.3*  HCT 18.6*  MCV 87.3  PLT 38*    Basename 10/23/12 0534  NA 143  K 3.3*  CL 106  CO2 25  GLUCOSE 151*  BUN 22  CREATININE 1.40*  CALCIUM 9.4  MG --  lablast2(ast:2,ALT:2,alkphos:2,bilitot:2,prot:2,albumin:2)@    No results found for this or any previous visit (from the past 240 hour(s)).   Dg Chest Port 1  View  10/23/2012  *RADIOLOGY REPORT*  Clinical Data: Shortness of breath  PORTABLE CHEST - 1 VIEW  Comparison: 08/15/2012  Findings: Right greater than left peri/infrahilar interstitial and airspace opacities.  Layering trace fluid on the right is not excluded.  Postoperative changes of median sternotomy and CABG. Cardiomediastinal contours are unchanged, with central vascular congestion. No acute osseous finding.  IMPRESSION: Right greater than left peri/infrahilar interstitial and airspace opacities may reflect asymmetric edema or infection.   Original Report Authenticated By: Greig Castilla  DelGaizo, M.D.    Impression: He is more anemic than usual. I think he has CHF. This is probably because of the anemia. He has a blood dyscrasia which causes anemia and low platelet count. He has a history of coronary artery occlusive disease but he is negative for infarction at this point. He has some COPD which is causing some of the shortness of breath probably. He is somewhat hypokalemic. He has mild chronic renal failure. Active Problems:  * No active hospital problems. *      Plan: He has already received Lasix in the emergency room. He is going to receive blood and have Lasix in between the units. I will let his oncologist know he is in the hospital.      Akaya Proffit L Pager [phone]  10/23/2012, 7:46 AM

## 2012-10-23 NOTE — ED Notes (Signed)
CRITICAL VALUE ALERT  Critical value received:  Hemoglobin 6.3  Date of notification:  10/23/12  Time of notification:  0632  Critical value read back:yes  Nurse who received alert:  r Raad Clayson, RN  MD notified (1st page):  wickline  Time of first page:  [phone]  MD notified (2nd page):  Time of second page:  Responding MD:  wickline  Time MD responded:  [phone]

## 2012-10-23 NOTE — ED Provider Notes (Signed)
History     CSN: [id number]  Arrival date & time 10/23/12  0446   First MD Initiated Contact with Patient 10/23/12 0500      Chief Complaint  Patient presents with  . Shortness of Breath     Patient is a 79 y.o. male presenting with shortness of breath. The history is provided by the patient and the spouse.  Shortness of Breath  The current episode started yesterday. The onset was gradual. The problem occurs occasionally. The problem has been gradually worsening. The problem is moderate. Nothing relieves the symptoms. Nothing aggravates the symptoms. Associated symptoms include shortness of breath. Pertinent negatives include no chest pain, no fever and no cough.  pt presents from home for shortness of breath.  Pt has had dyspnea previously, but over the past 24 hours it has worsened.  No active CP reported.  No fever is reported.  He reports he has difficulty walking due to SOB and he also reports lower extremity edema. He reports general weakness   Past Medical History  Diagnosis Date  . Hypertension   . COPD (chronic obstructive pulmonary disease)   . CAD (coronary artery disease)     s/p 4V CABG '96, Myoview '10 w/o ischemia  . Arthritis   . Atrial fibrillation     coumadin stopped 12/2011 2/2 anemia  . Dysphagia   . Anemia     requiring blood transfusions  . Carotid stenosis     Dr Waverly Ferrari follows.   . Diabetes mellitus, type 2     A1C 6.7% 12/2011  . HLD (hyperlipidemia)     LDL 65 12/2011, intolerant to statins  . Hematuria     felt 2/2 foley insertion  . LV dysfunction     EF 45-50% by echo 2007  . Melena     EGD & Colonoscopy 09/2011 revealed gastritis, erosions, scars, diverticula, 8 colon polyps (largest 1.6cm, not removed 2/2 anticoagulation), small AVM in transverse colon  . History of tobacco abuse     quit 1992  . Anxiety   . Shortness of breath     exertion  . GERD (gastroesophageal reflux disease)   . Myocardial infarction   .  Thrombocytopenia 09/01/2012  . Myelodysplastic syndrome with 5 q minus 10/17/2012    On Revlimid 5 mg daily 21 days on and 7 days off    Past Surgical History  Procedure Date  . Knee surgery 1960    Right knee cartilage  . Rotator cuff repair 1990    right   . Inguinal hernia repair 2000 and 2010    left  . Cholecystectomy 05/2010    Lap chole with biologic mesh repair/reinforcement by  Dr Zachery Dakins  . Back surgery 02/2006    ruptured disk,  post op diskitis infection requiring prolonged hospital stay and  surgical I & D  . Tonsillectomy   . Esophagogastroduodenoscopy 10/08/2011    Procedure: ESOPHAGOGASTRODUODENOSCOPY (EGD);  Surgeon: Malissa Hippo, MD;  [facility]: [facility];  Service: Endoscopy;  Laterality: N/A;  . Colonoscopy 10/08/2011    Procedure: COLONOSCOPY;  Surgeon: Malissa Hippo, MD;  Location: [facility];  Service: Endoscopy;  Laterality: N/A;  . Cataract extraction, bilateral   . Peg placement 07/2006    placed due to prolonged infection, poor po intake, malnutrition during several week hospital stay resulting from ruptured disc that became infected following surgery  . Colonoscopy 01/14/2012    Procedure: COLONOSCOPY;  Surgeon: Malissa Hippo, MD;  Location:  [facility];  Service: Endoscopy;  Laterality: N/A;  945  . Coronary artery bypass graft 1996    4 by-pass  . Ventral hernia repair   . Colonoscopy 04/20/2012    Procedure: COLONOSCOPY;  Surgeon: Malissa Hippo, MD;  Location: [facility];  Service: Endoscopy;  Laterality: N/A;  1030  . Pr vein bypass graft,aorto-fem-pop 1996  . Spine surgery   . Joint replacement 1960    Right Knee  . Lymph node biopsy 08/15/2012    Procedure: LYMPH NODE BIOPSY;  Surgeon: Marlane Hatcher, MD;  Location: [facility];  Service: General;  Laterality: Left;  Cervical Lymph Node Bx in Minor Room  . Bone marrow biopsy   . Bone marrow aspiration     Family History  Problem Relation Age of Onset  . Heart disease Sister     . Cancer Sister   . Hyperlipidemia Sister   . Hypertension Sister   . Heart disease Brother     Heart Diseast before age 49  . Coronary artery disease Mother   . Cancer Father     stomach  . Anesthesia problems Neg Hx   . Hypotension Neg Hx   . Malignant hyperthermia Neg Hx   . Pseudochol deficiency Neg Hx     History  Substance Use Topics  . Smoking status: Former Smoker -- 2.0 packs/day for 40 years    Types: Cigarettes    Quit date: 09/27/1990  . Smokeless tobacco: Never Used  . Alcohol Use: No      Review of Systems  Constitutional: Negative for fever.  Respiratory: Positive for shortness of breath. Negative for cough.   Cardiovascular: Negative for chest pain.  Gastrointestinal: Negative for vomiting.  Neurological: Positive for weakness.  Psychiatric/Behavioral: Negative for agitation.  All other systems reviewed and are negative.    Allergies  Statins and Tape  Home Medications   Current Outpatient Rx  Name  Route  Sig  Dispense  Refill  . ACETAMINOPHEN 500 MG PO TABS   Oral   Take 1 tablet (500 mg total) by mouth every 6 (six) hours as needed. Pain   30 tablet      . AMIODARONE HCL 200 MG PO TABS   Oral   Take 0.5 tablets (100 mg total) by mouth daily.   30 tablet   5   . AMLODIPINE BESYLATE 5 MG PO TABS   Oral   Take 5 mg by mouth daily.         . ASPIRIN 81 MG PO TABS   Oral   Take 81 mg by mouth daily.         Marland Kitchen DIAZEPAM 5 MG PO TABS   Oral   Take 5 mg by mouth every 6 (six) hours as needed. Muscles Spasms         . HYDROCODONE-ACETAMINOPHEN 5-325 MG PO TABS   Oral   Take 1 tablet by mouth every 6 (six) hours as needed. Pain         . LENALIDOMIDE 5 MG PO CAPS   Oral   Take 1 capsule (5 mg total) by mouth daily.   21 capsule   3   . METFORMIN HCL 500 MG PO TABS   Oral   Take 500 mg by mouth 2 (two) times daily with a meal.         . METOPROLOL TARTRATE 25 MG PO TABS   Oral   Take 25 mg by mouth 2 (two)  times  daily.           Marland Kitchen ONE-DAILY MULTI VITAMINS PO TABS   Oral   Take 1 tablet by mouth daily.           Marland Kitchen NITROGLYCERIN 0.4 MG SL SUBL   Sublingual   Place 1 tablet (0.4 mg total) under the tongue every 5 (five) minutes x 3 doses as needed for chest pain (up to 3 doses).   25 tablet   3   . PREGABALIN 75 MG PO CAPS   Oral   Take 75 mg by mouth once.         . RED YEAST RICE 600 MG PO TABS   Oral   Take 1 tablet by mouth daily.           Marland Kitchen SILODOSIN 8 MG PO CAPS   Oral   Take 8 mg by mouth daily with breakfast.             BP 139/58  Pulse 63  Resp 24  Ht 5\' 10"  (1.778 m)  Wt 206 lb (93.441 kg)  BMI 29.56 kg/m2  SpO2 96%  Physical Exam CONSTITUTIONAL: Well developed/well nourished, tachypnea noted HEAD AND FACE: Normocephalic/atraumatic EYES: EOMI/PERRL ENMT: Mucous membranes moist NECK: supple no meningeal signs SPINE:entire spine nontender CV: S1/S2 noted, no murmurs/rubs/gallops noted LUNGS:scattered bilateral wheeze noted and tachypnea noted ABDOMEN: soft, nontender, no rebound or guarding GU:no cva tenderness NEURO: Pt is awake/alert, moves all extremitiesx4 EXTREMITIES:  full ROM. 1+ pitting edema noted bilateral LE.   SKIN: warm, color normal PSYCH: no abnormalities of mood noted  ED Course  Procedures    Labs Reviewed  BASIC METABOLIC PANEL  CBC WITH DIFFERENTIAL  TROPONIN I  PRO B NATRIURETIC PEPTIDE  5:37 AM Pt with h/o myelodysplastic syndrome s/p recent PRBC transfusion and also on oral chemo started on 1/13 presenting for worsening SOB.  He is tachypneic but he is protecting his airway and can speak to me clearly.  He does have wheezing.  Albuterol ordered.  Labs/imaging currently pending.  Will follow closely   7:26 AM Pt with some improvement but he is weak upon attempted ambulation.  Suspect he may have some underlying CHF but also complicated by anemia (HGB <7).  D/w dr Juanetta Gosling who will admit for observation.  Pt feels he may need  another blood transfusion due to anemia.  PRBC ordered.      MDM  Nursing notes including past medical history and social history reviewed and considered in documentation Previous records reviewed and considered xrays reviewed and considered Labs/vital reviewed and considered     Date: 10/23/2012  Rate: 66  Rhythm: normal sinus rhythm  QRS Axis: normal  Intervals: QT prolonged  ST/T Wave abnormalities: ST depressions laterally  Conduction Disutrbances:none  Narrative Interpretation:   Old EKG Reviewed: qt prolongation greater on this ekg       Joya Gaskins, MD 10/23/12 [phone]

## 2012-10-23 NOTE — ED Notes (Signed)
Attempted to call report to unit 300.  RN in room w/patient, will call back.

## 2012-10-24 ENCOUNTER — Inpatient Hospital Stay (HOSPITAL_COMMUNITY): Payer: Medicare Other

## 2012-10-24 ENCOUNTER — Encounter (HOSPITAL_COMMUNITY): Payer: Self-pay | Admitting: General Practice

## 2012-10-24 DIAGNOSIS — D649 Anemia, unspecified: Principal | ICD-10-CM

## 2012-10-24 DIAGNOSIS — D46C Myelodysplastic syndrome with isolated del(5q) chromosomal abnormality: Secondary | ICD-10-CM

## 2012-10-24 DIAGNOSIS — I059 Rheumatic mitral valve disease, unspecified: Secondary | ICD-10-CM

## 2012-10-24 DIAGNOSIS — I2510 Atherosclerotic heart disease of native coronary artery without angina pectoris: Secondary | ICD-10-CM

## 2012-10-24 DIAGNOSIS — I5023 Acute on chronic systolic (congestive) heart failure: Secondary | ICD-10-CM

## 2012-10-24 LAB — CBC
HCT: 28.1 % — ABNORMAL LOW (ref 39.0–52.0)
Hemoglobin: 9.8 g/dL — ABNORMAL LOW (ref 13.0–17.0)
RBC: 3.26 MIL/uL — ABNORMAL LOW (ref 4.22–5.81)
RDW: 15.2 % (ref 11.5–15.5)
WBC: 4.2 10*3/uL (ref 4.0–10.5)

## 2012-10-24 LAB — BASIC METABOLIC PANEL
CO2: 30 mEq/L (ref 19–32)
Chloride: 98 mEq/L (ref 96–112)
GFR calc Af Amer: 48 mL/min — ABNORMAL LOW (ref >90)
Potassium: 3.3 mEq/L — ABNORMAL LOW (ref 3.5–5.1)
Sodium: 140 mEq/L (ref 135–145)

## 2012-10-24 MED ORDER — FUROSEMIDE 10 MG/ML IJ SOLN
40.0000 mg | Freq: Two times a day (BID) | INTRAMUSCULAR | Status: DC
  Administered 2012-10-25: 40 mg via INTRAVENOUS
  Filled 2012-10-24: qty 4

## 2012-10-24 MED ORDER — FUROSEMIDE 20 MG PO TABS: 20.0000 mg | ORAL_TABLET | Freq: Two times a day (BID) | ORAL | Status: DC

## 2012-10-24 MED ORDER — FUROSEMIDE 10 MG/ML IJ SOLN: 40.0000 mg | Freq: Two times a day (BID) | INTRAMUSCULAR | Status: DC

## 2012-10-24 MED ORDER — AZITHROMYCIN 250 MG PO TABS
250.0000 mg | ORAL_TABLET | Freq: Every day | ORAL | Status: DC
  Administered 2012-10-25: 250 mg via ORAL
  Filled 2012-10-24: qty 1

## 2012-10-24 MED ORDER — AZITHROMYCIN 250 MG PO TABS
500.0000 mg | ORAL_TABLET | Freq: Every day | ORAL | Status: AC
  Administered 2012-10-24: 500 mg via ORAL
  Filled 2012-10-24: qty 2

## 2012-10-24 MED ORDER — POTASSIUM CHLORIDE CRYS ER 20 MEQ PO TBCR
40.0000 meq | EXTENDED_RELEASE_TABLET | Freq: Two times a day (BID) | ORAL | Status: AC
  Administered 2012-10-25 (×2): 40 meq via ORAL
  Filled 2012-10-24 (×2): qty 1
  Filled 2012-10-24: qty 2

## 2012-10-24 NOTE — Progress Notes (Signed)
*  PRELIMINARY RESULTS* Echocardiogram 2D Echocardiogram has been performed.  Conrad Mekoryuk 10/24/2012, 11:57 AM

## 2012-10-24 NOTE — Plan of Care (Signed)
Problem: Phase II Progression Outcomes Goal: Discharge plan established Outcome: Progressing Plan to d/c home tom  (06-25-13)  With family

## 2012-10-24 NOTE — Consult Note (Signed)
[facility]  Name: [name]      MRN: [id number]    Location: A316/A316-01  Date: 10/24/2012 Time:8:36 AM   REFERRING PHYSICIAN:  Kari Baars, MD  REASON FOR CONSULT:  Anemia, thrombocytopenia   DIAGNOSIS:  MDS 5q-  HISTORY OF PRESENT ILLNESS:   This is a 79 year old Caucasian man who is well known to the [facility] for his 5q- MDS disease.  He takes Revlimid 5 mg daily for 21 days with a 7 day break.  He is tolerating therapy well.   [name] reported to the ED with chest pressure/pain and significant SOB.  He was noted to be anemic with a Hgb of 6.3 g/dL.  His platelet count was noted to be 38,000.  He has been transfused 4 units of PRBCs this hospitalization.  I spent some time with the patient discussing the role of the Revlimid in association with his 5q- disease.  I also spent some time discussing his disease with him.  We often see the platelets drop before they improve with this disease and therapy.  As a result, we will continue Revlimid in accordance with his cycles.  We will continue to support the patient with supportive care.  As an outpatient, we will check labs twice per week now, until is counts begin to improve.  He denies any complaints this AM.  He reports that his breathing is improved.  His chest pain/pressure has resolved.  His appetite is strong.   He admits to a "cold".  He denies a sore throat or sputum production that is colored from his chest of sinuses.  On physical exam, his posterior pharynx is erythematous.  PAST MEDICAL HISTORY:   Past Medical History  Diagnosis Date  . Hypertension   . COPD (chronic obstructive pulmonary disease)   . CAD (coronary artery disease)     s/p 4V CABG '96, Myoview '10 w/o ischemia  . Arthritis   . Atrial fibrillation     coumadin stopped 12/2011 2/2 anemia  . Dysphagia   . Anemia     requiring blood transfusions  . Carotid stenosis     Dr Waverly Ferrari follows.     . Diabetes mellitus, type 2     A1C 6.7% 12/2011  . HLD (hyperlipidemia)     LDL 65 12/2011, intolerant to statins  . Hematuria     felt 2/2 foley insertion  . LV dysfunction     EF 45-50% by echo 2007  . Melena     EGD & Colonoscopy 09/2011 revealed gastritis, erosions, scars, diverticula, 8 colon polyps (largest 1.6cm, not removed 2/2 anticoagulation), small AVM in transverse colon  . History of tobacco abuse     quit 1992  . Anxiety   . Shortness of breath     exertion  . GERD (gastroesophageal reflux disease)   . Myocardial infarction   . Thrombocytopenia 09/01/2012  . Myelodysplastic syndrome with 5 q minus 10/17/2012    On Revlimid 5 mg daily 21 days on and 7 days off    ALLERGIES: Allergies  Allergen Reactions  . Statins Other (See Comments)    Causes legs to hurt.   . Tape     Causes skin to peel off.       MEDICATIONS: I have reviewed the patient's current medications.     PAST SURGICAL HISTORY Past Surgical History  Procedure Date  . Knee surgery 1960    Right knee cartilage  .  Rotator cuff repair 1990    right   . Inguinal hernia repair 2000 and 2010    left  . Cholecystectomy 05/2010    Lap chole with biologic mesh repair/reinforcement by  Dr Zachery Dakins  . Back surgery 02/2006    ruptured disk,  post op diskitis infection requiring prolonged hospital stay and  surgical I & D  . Tonsillectomy   . Esophagogastroduodenoscopy 10/08/2011    Procedure: ESOPHAGOGASTRODUODENOSCOPY (EGD);  Surgeon: Malissa Hippo, MD;  [facility];  Service: Endoscopy;  Laterality: N/A;  . Colonoscopy 10/08/2011    Procedure: COLONOSCOPY;  Surgeon: Malissa Hippo, MD;  [facility]: [facility];  Service: Endoscopy;  Laterality: N/A;  . Cataract extraction, bilateral   . Peg placement 07/2006    placed due to prolonged infection, poor po intake, malnutrition during several week hospital stay resulting from ruptured disc that became infected following surgery  . Colonoscopy  01/14/2012    Procedure: COLONOSCOPY;  Surgeon: Malissa Hippo, MD;  [facility]: [facility];  Service: Endoscopy;  Laterality: N/A;  945  . Coronary artery bypass graft 1996    4 by-pass  . Ventral hernia repair   . Colonoscopy 04/20/2012    Procedure: COLONOSCOPY;  Surgeon: Malissa Hippo, MD;  [facility];  Service: Endoscopy;  Laterality: N/A;  1030  . Pr vein bypass graft,aorto-fem-pop 1996  . Spine surgery   . Joint replacement 1960    Right Knee  . Lymph node biopsy 08/15/2012    Procedure: LYMPH NODE BIOPSY;  Surgeon: Marlane Hatcher, MD;  Location: [facility];  Service: General;  Laterality: Left;  Cervical Lymph Node Bx in Minor Room  . Bone marrow biopsy   . Bone marrow aspiration     FAMILY HISTORY: Family History  Problem Relation Age of Onset  . Heart disease Sister   . Cancer Sister   . Hyperlipidemia Sister   . Hypertension Sister   . Heart disease Brother     Heart Diseast before age 1  . Coronary artery disease Mother   . Cancer Father     stomach  . Anesthesia problems Neg Hx   . Hypotension Neg Hx   . Malignant hyperthermia Neg Hx   . Pseudochol deficiency Neg Hx     SOCIAL HISTORY:  reports that he quit smoking about 22 years ago. His smoking use included Cigarettes. He has a 80 pack-year smoking history. He has never used smokeless tobacco. He reports that he does not drink alcohol or use illicit drugs.  PERFORMANCE STATUS: The patient's performance status is 2 - Symptomatic, <50% confined to bed  PHYSICAL EXAM: Most Recent Vital Signs: Blood pressure 141/67, pulse 62, temperature 98.2 F (36.8 C), temperature source Oral, resp. rate 20, height 5\' 10"  (1.778 m), weight 206 lb (93.441 kg), SpO2 96.00%. General appearance: alert, cooperative, appears stated age and no distress Head: Normocephalic, without obvious abnormality, atraumatic Eyes: negative findings: lids and lashes normal and conjunctivae and sclerae normal Nose: no discharge,  no crusting or bleeding points Throat: abnormal findings: mild oropharyngeal erythema and with post nasal drip identified Neck: supple, symmetrical, trachea midline and nontender Lungs: clear to auscultation bilaterally Heart: regular rate and rhythm, S1, S2 normal, no murmur, click, rub or gallop Abdomen: normal findings: bowel sounds normal and soft, non-tender Extremities: pneumatic compression stockings in place Skin: Skin color, texture, turgor normal. No rashes or lesions Lymph nodes: Cervical, supraclavicular, and axillary nodes normal. Neurologic: Grossly normal  LABORATORY DATA:  Results for orders placed during the hospital encounter of 10/23/12 (from the past 48 hour(s))  BASIC METABOLIC PANEL     Status: Abnormal   Collection Time   10/23/12  5:34 AM      Component Value Range Comment   Sodium 143  135 - 145 mEq/L    Potassium 3.3 (*) 3.5 - 5.1 mEq/L    Chloride 106  96 - 112 mEq/L    CO2 25  19 - 32 mEq/L    Glucose, Bld 151 (*) 70 - 99 mg/dL    BUN 22  6 - 23 mg/dL    Creatinine, Ser 9.56 (*) 0.50 - 1.35 mg/dL    Calcium 9.4  8.4 - 21.3 mg/dL    GFR calc non Af Amer 47 (*) >90 mL/min    GFR calc Af Amer 54 (*) >90 mL/min   CBC WITH DIFFERENTIAL     Status: Abnormal   Collection Time   10/23/12  5:34 AM      Component Value Range Comment   WBC 3.7 (*) 4.0 - 10.5 K/uL    RBC 2.13 (*) 4.22 - 5.81 MIL/uL    Hemoglobin 6.3 (*) 13.0 - 17.0 g/dL    HCT 08.6 (*) 57.8 - 52.0 %    MCV 87.3  78.0 - 100.0 fL    MCH 29.6  26.0 - 34.0 pg    MCHC 33.9  30.0 - 36.0 g/dL    RDW 46.9 (*) 62.9 - 15.5 %    Platelets 38 (*) 150 - 400 K/uL    Neutrophils Relative 30 (*) 43 - 77 %    Neutro Abs 1.1 (*) 1.7 - 7.7 K/uL    Lymphocytes Relative 42  12 - 46 %    Lymphs Abs 1.6  0.7 - 4.0 K/uL    Monocytes Relative 4  3 - 12 %    Monocytes Absolute 0.2  0.1 - 1.0 K/uL    Eosinophils Relative 15 (*) 0 - 5 %    Eosinophils Absolute 0.6  0.0 - 0.7 K/uL    Basophils Relative 9 (*) 0 - 1 %     Basophils Absolute 0.3 (*) 0.0 - 0.1 K/uL    Smear Review PLATELETS APPEAR DECREASED     TROPONIN I     Status: Normal   Collection Time   10/23/12  5:34 AM      Component Value Range Comment   Troponin I <0.30  <0.30 ng/mL   PRO B NATRIURETIC PEPTIDE     Status: Abnormal   Collection Time   10/23/12  5:34 AM      Component Value Range Comment   Pro B Natriuretic peptide (BNP) 3246.0 (*) 0 - 450 pg/mL   TYPE AND SCREEN     Status: Normal (Preliminary result)   Collection Time   10/23/12  6:40 AM      Component Value Range Comment   ABO/RH(D) A NEG      Antibody Screen NEG      Sample Expiration 10/26/2012      Unit Number [id number]      Blood Component Type RED CELLS,LR      Unit division 00      Status of Unit ISSUED      Transfusion Status OK TO TRANSFUSE      Crossmatch Result Compatible      Unit Number [id number]      Blood Component Type RED CELLS,LR  Unit division 00      Status of Unit ISSUED      Transfusion Status OK TO TRANSFUSE      Crossmatch Result Compatible      Unit Number [id number]      Blood Component Type RED CELLS,LR      Unit division 00      Status of Unit ISSUED      Transfusion Status OK TO TRANSFUSE      Crossmatch Result Compatible      Unit Number [id number]      Blood Component Type RED CELLS,LR      Unit division 00      Status of Unit ISSUED      Transfusion Status OK TO TRANSFUSE      Crossmatch Result Compatible      Unit Number [id number]      Blood Component Type RBC LR PHER1      Unit division 00      Status of Unit ALLOCATED      Transfusion Status OK TO TRANSFUSE      Crossmatch Result Compatible     PREPARE RBC (CROSSMATCH)     Status: Normal   Collection Time   10/23/12  6:40 AM      Component Value Range Comment   Order Confirmation ORDER PROCESSED BY BLOOD BANK     PREPARE RBC (CROSSMATCH)     Status: Normal   Collection Time   10/23/12  8:00 AM      Component Value Range Comment   Order  Confirmation ORDER PROCESSED BY BLOOD BANK     PREPARE RBC (CROSSMATCH)     Status: Normal   Collection Time   10/23/12  6:56 PM      Component Value Range Comment   Order Confirmation ORDER PROCESSED BY BLOOD BANK     CBC WITH DIFFERENTIAL     Status: Abnormal   Collection Time   10/23/12  7:51 PM      Component Value Range Comment   WBC 4.7  4.0 - 10.5 K/uL    RBC 2.82 (*) 4.22 - 5.81 MIL/uL    Hemoglobin 8.5 (*) 13.0 - 17.0 g/dL    HCT 52.8 (*) 41.3 - 52.0 %    MCV 86.5  78.0 - 100.0 fL    MCH 30.1  26.0 - 34.0 pg    MCHC 34.8  30.0 - 36.0 g/dL    RDW 24.4 (*) 01.0 - 15.5 %    Platelets 47 (*) 150 - 400 K/uL    Neutrophils Relative 52  43 - 77 %    Lymphocytes Relative 21  12 - 46 %    Monocytes Relative 4  3 - 12 %    Eosinophils Relative 13 (*) 0 - 5 %    Basophils Relative 10 (*) 0 - 1 %    Neutro Abs 2.4  1.7 - 7.7 K/uL    Lymphs Abs 1.0  0.7 - 4.0 K/uL    Monocytes Absolute 0.2  0.1 - 1.0 K/uL    Eosinophils Absolute 0.6  0.0 - 0.7 K/uL    Basophils Absolute 0.5 (*) 0.0 - 0.1 K/uL    WBC Morphology WHITE COUNT CONFIRMED ON SMEAR   ATYPICAL LYMPHOCYTES   Smear Review LARGE PLATELETS PRESENT   PLATELETS APPEAR DECREASED  BASIC METABOLIC PANEL     Status: Abnormal   Collection Time   10/24/12  6:34 AM      Component Value Range Comment  Sodium 140  135 - 145 mEq/L    Potassium 3.3 (*) 3.5 - 5.1 mEq/L    Chloride 98  96 - 112 mEq/L DELTA CHECK NOTED   CO2 30  19 - 32 mEq/L    Glucose, Bld 128 (*) 70 - 99 mg/dL    BUN 25 (*) 6 - 23 mg/dL    Creatinine, Ser 1.61 (*) 0.50 - 1.35 mg/dL    Calcium 9.1  8.4 - 09.6 mg/dL    GFR calc non Af Amer 41 (*) >90 mL/min    GFR calc Af Amer 48 (*) >90 mL/min   CBC     Status: Abnormal   Collection Time   10/24/12  6:34 AM      Component Value Range Comment   WBC 4.2  4.0 - 10.5 K/uL    RBC 3.26 (*) 4.22 - 5.81 MIL/uL    Hemoglobin 9.8 (*) 13.0 - 17.0 g/dL    HCT 04.5 (*) 40.9 - 52.0 %    MCV 86.2  78.0 - 100.0 fL    MCH 30.1   26.0 - 34.0 pg    MCHC 34.9  30.0 - 36.0 g/dL    RDW 81.1  91.4 - 78.2 %    Platelets 44 (*) 150 - 400 K/uL       RADIOGRAPHY: Dg Chest Port 1 View  10/23/2012  *RADIOLOGY REPORT*  Clinical Data: Shortness of breath  PORTABLE CHEST - 1 VIEW  Comparison: 08/15/2012  Findings: Right greater than left peri/infrahilar interstitial and airspace opacities.  Layering trace fluid on the right is not excluded.  Postoperative changes of median sternotomy and CABG. Cardiomediastinal contours are unchanged, with central vascular congestion. No acute osseous finding.  IMPRESSION: Right greater than left peri/infrahilar interstitial and airspace opacities may reflect asymmetric edema or infection.   Original Report Authenticated By: Jearld Lesch, M.D.         ASSESSMENT:  1. Anemia, requiring 4 units PRBCs this hospitalization 2. Thrombocytopenia, stable, secondary to 5q- MDS and its treatment. 3. MDS, 5q- syndrome. 4. Posterior pharynx erythema with post nasal drip, probable URI  PLAN:  1. Recommend continued supportive care 2. Transfuse PRBCs to maintain a Hgb of 9 or greater. 3. Transfuse platelets for platelet count of less than 10,000 or active bleeding.  4. Will check labs twice per week at the [facility] as an outpatient.  Please call [facility] at time of discharge to set-up CBC diff on Mondays and Thursdays. 5. Will start Z-Pak in the hospital that can be completed as an outpatient.   All questions were answered. The patient knows to call the clinic with any problems, questions or concerns. We can certainly see the patient much sooner if necessary.  The patient and plan discussed with Glenford Peers, MD and he is in agreement with the aforementioned.  Johannes Everage

## 2012-10-24 NOTE — Progress Notes (Signed)
UR Chart Review Completed  

## 2012-10-24 NOTE — Care Management Note (Signed)
    Page 1 of 1   10/25/2012     3:54:33 PM   CARE MANAGEMENT NOTE 10/25/2012  Patient:  [name], [name]   Account Number:  [id number]  Date Initiated:  10/24/2012  Documentation initiated by:  Sharrie Rothman  Subjective/Objective Assessment:   Pt admitted from home with anemia and CHF. Pt lives with his wife and will return home at discharge. Pt is independent with ADL's.     Action/Plan:   No CM or HH needs noted.   Anticipated DC Date:  10/26/2012   Anticipated DC Plan:  HOME/SELF CARE      DC Planning Services  CM consult      Choice offered to / List presented to:             Status of service:  Completed, signed off Medicare Important Message given?   (If response is "NO", the following Medicare IM given date fields will be blank) Date Medicare IM given:   Date Additional Medicare IM given:    Discharge Disposition:  HOME/SELF CARE  Per UR Regulation:    If discussed at Long Length of Stay Meetings, dates discussed:    Comments:  10/25/12 1555 Arlyss Queen, RN BSN CM Pt discharged home today. No CM or HH needs noted.  10/24/12 1305 Arlyss Queen, RN BSN CM

## 2012-10-24 NOTE — Consult Note (Signed)
CARDIOLOGY CONSULT NOTE  Patient ID: [name] MRN: [id number] DOB/AGE: 10/05/1931 79 y.o.  Admit date: 10/23/2012 Referring Physician: Kari Baars MD Primary Caleb Grays, MD Primary Cardiologist: [name] Reason for Consultation: Recurrent CHF, Atrial Fib  Active Problems:  CAD  PVD  CAROTID ARTERY STENOSIS  Anemia  Myelodysplastic syndrome with 5 q minus  HPI: [name] is a 79 y/o patient admitted with chest pain, CHF, LEE and significant anemia (admission Hgb 6.3) , with known history of atrial fibrillation (on amiodarone 100 mg daily)  in NSR since 2012, not considered a coumadin candidate, blood dyscrasia (MDS 5 Q-Syndrome) followed by oncology. He has been transfused 4 units of PRBC's since admission with improvement in symptoms and Hgb to 9.8. CXR demonstrated right greater than left peri/infrahilar interstitial and airspace opacities which may reflect asymmetric edema or infection. He has been placed on a Z-pack per oncology.  Pro-BNP 3246. Given 2 doses of IV lasix 20 mg in between and after blood  transfusion. We are asked for cardiac recommendations for CHF.    He has known history of CAD s/p 4 vessel CABG in '96 and follow up nuclear stress test in 2010 which was negative for ischemia. Most recent echo was in 2007 demonstrating  LVEF 45 % to 50 %.hypokinesis of the septal wall.  He was hospitalized in April of 2013 with chest pain the setting of anemia and mildly elevated troponin, which was felt to be  related to demand ischemia. Coumadin was stopped during that admission in the setting of anemia, gastritis, and hematuria. He has followed up with [name] in May of 2013 with no changes in his medical management.  Other history includes diabetes, COPD, and hypertension.  Review of systems complete and found to be negative unless listed above   Past Medical History  Diagnosis Date  . Hypertension   . COPD (chronic obstructive pulmonary disease)   .  CAD (coronary artery disease)     s/p 4V CABG '96, Myoview '10 w/o ischemia  . Arthritis   . Atrial fibrillation     coumadin stopped 12/2011 2/2 anemia  . Dysphagia   . Anemia     requiring blood transfusions  . Carotid stenosis     [name] follows.   . Diabetes mellitus, type 2     A1C 6.7% 12/2011  . HLD (hyperlipidemia)     LDL 65 12/2011, intolerant to statins  . Hematuria     felt 2/2 foley insertion  . LV dysfunction     EF 45-50% by echo 2007  . Melena     EGD & Colonoscopy 09/2011 revealed gastritis, erosions, scars, diverticula, 8 colon polyps (largest 1.6cm, not removed 2/2 anticoagulation), small AVM in transverse colon  . History of tobacco abuse     quit 1992  . Anxiety   . Shortness of breath     exertion  . GERD (gastroesophageal reflux disease)   . Myocardial infarction   . Thrombocytopenia 09/01/2012  . Myelodysplastic syndrome with 5 q minus 10/17/2012    On Revlimid 5 mg daily 21 days on and 7 days off    Family History  Problem Relation Age of Onset  . Heart disease Sister   . Cancer Sister   . Hyperlipidemia Sister   . Hypertension Sister   . Heart disease Brother     Heart Diseast before age 82  . Coronary artery disease Mother   . Cancer Father     stomach  .  Anesthesia problems Neg Hx   . Hypotension Neg Hx   . Malignant hyperthermia Neg Hx   . Pseudochol deficiency Neg Hx     History   Social History  . Marital Status: Married    Spouse Name: N/A    Number of Children: N/A  . Years of Education: N/A   Occupational History  . Not on file.   Social History Main Topics  . Smoking status: Former Smoker -- 2.0 packs/day for 40 years    Types: Cigarettes    Quit date: 09/27/1990  . Smokeless tobacco: Never Used  . Alcohol Use: No  . Drug Use: No  . Sexually Active: Not Currently   Other Topics Concern  . Not on file   Social History Narrative  . No narrative on file    Past Surgical History  Procedure Date  . Knee  surgery 1960    Right knee cartilage  . Rotator cuff repair 1990    right   . Inguinal hernia repair 2000 and 2010    left  . Cholecystectomy 05/2010    Lap chole with biologic mesh repair/reinforcement by  Caleb [name]  . Back surgery 02/2006    ruptured disk,  post op diskitis infection requiring prolonged hospital stay and  surgical I & D  . Tonsillectomy   . Esophagogastroduodenoscopy 10/08/2011    Procedure: ESOPHAGOGASTRODUODENOSCOPY (EGD);  Surgeon: Caleb Hippo, MD;  Location: [facility];  Service: Endoscopy;  Laterality: N/A;  . Colonoscopy 10/08/2011    Procedure: COLONOSCOPY;  Surgeon: Caleb Hippo, MD;  Location: [facility];  Service: Endoscopy;  Laterality: N/A;  . Cataract extraction, bilateral   . Peg placement 07/2006    placed due to prolonged infection, poor po intake, malnutrition during several week hospital stay resulting from ruptured disc that became infected following surgery  . Colonoscopy 01/14/2012    Procedure: COLONOSCOPY;  Surgeon: Caleb Hippo, MD;  Location: [facility];  Service: Endoscopy;  Laterality: N/A;  945  . Coronary artery bypass graft 1996    4 by-pass  . Ventral hernia repair   . Colonoscopy 04/20/2012    Procedure: COLONOSCOPY;  Surgeon: Caleb Hippo, MD;  Location: [facility];  Service: Endoscopy;  Laterality: N/A;  1030  . Pr vein bypass graft,aorto-fem-pop 1996  . Spine surgery   . Joint replacement 1960    Right Knee  . Lymph node biopsy 08/15/2012    Procedure: LYMPH NODE BIOPSY;  Surgeon: Caleb Hatcher, MD;  Location: [facility];  Service: General;  Laterality: Left;  Cervical Lymph Node Bx in Minor Room  . Bone marrow biopsy   . Bone marrow aspiration     Prescriptions prior to admission  Medication Sig Dispense Refill  . acetaminophen (TYLENOL) 500 MG tablet Take 1 tablet (500 mg total) by mouth every 6 (six) hours as needed. Pain  30 tablet    . amiodarone (PACERONE) 200 MG tablet Take 0.5 tablets (100 mg  total) by mouth daily.  30 tablet  5  . amLODipine (NORVASC) 5 MG tablet Take 5 mg by mouth daily.      Marland Kitchen aspirin 81 MG tablet Take 81 mg by mouth daily.      . diazepam (VALIUM) 5 MG tablet Take 5 mg by mouth every 6 (six) hours as needed. Muscles Spasms      . HYDROcodone-acetaminophen (NORCO) 5-325 MG per tablet Take 1 tablet by mouth every 6 (six) hours as needed.  Pain      . lenalidomide (REVLIMID) 5 MG capsule Take 1 capsule (5 mg total) by mouth daily.  21 capsule  3  . metFORMIN (GLUCOPHAGE) 500 MG tablet Take 500 mg by mouth 2 (two) times daily with a meal.      . metoprolol tartrate (LOPRESSOR) 25 MG tablet Take 25 mg by mouth 2 (two) times daily.        . Multiple Vitamin (MULTIVITAMIN) tablet Take 1 tablet by mouth daily.        . nitroGLYCERIN (NITROSTAT) 0.4 MG SL tablet Place 1 tablet (0.4 mg total) under the tongue every 5 (five) minutes x 3 doses as needed for chest pain (up to 3 doses).  25 tablet  3  . pregabalin (LYRICA) 75 MG capsule Take 75 mg by mouth daily.      . Red Yeast Rice 600 MG TABS Take 1 tablet by mouth daily.        . silodosin (RAPAFLO) 8 MG CAPS capsule Take 8 mg by mouth daily with breakfast.         Physical Exam: Blood pressure 141/67, pulse 62, temperature 98.2 F (36.8 C), temperature source Oral, resp. rate 20, height 5\' 10"  (1.778 m), weight 206 lb (93.441 kg), SpO2 96.00%.  General: Well developed, well nourished, in no acute distress Head: Eyes PERRLA, No xanthomas.   Normal cephalic and atramatic  Lungs: Bilateral crackles in the bases without wheezes or rhonchi. No cough with inspiration. Heart: HRRR S1 S2, 2/6 holosystolic murmur heard best at the RSB; Pulses are 2+ & equal.            Bilateral carotid bruits. No JVD.  Soft  abdominal bruits. No femoral bruits. Abdomen: Bowel sounds are positive, abdomen soft and non-tender without masses or                  Hernia's noted. Msk:  Back normal, normal gait. Normal strength and tone for  age. Extremities: No clubbing, cyanosis. 1/2+ edema.  DP +1 Neuro: Alert and oriented X 3. Psych:  Good affect, responds appropriately   Lab Results  Component Value Date   WBC 4.2 10/24/2012   HGB 9.8* 10/24/2012   HCT 28.1* 10/24/2012   MCV 86.2 10/24/2012   PLT 44* 10/24/2012    Lab 10/24/12 0634  NA 140  K 3.3*  CL 98  CO2 30  BUN 25*  CREATININE 1.55*  CALCIUM 9.1  PROT --  BILITOT --  ALKPHOS --  ALT --  AST --  GLUCOSE 128*   Lab Results  Component Value Date   CKTOTAL 53 01/04/2012   CKMB 1.9 01/04/2012   TROPONINI <0.30 10/23/2012    Lab Results  Component Value Date   CHOL 134 01/04/2012   CHOL 175 10/19/2010   CHOL 149 10/06/2009   Lab Results  Component Value Date   HDL 34* 01/04/2012   HDL 43 4/54/0981   HDL 36.60* 10/06/2009   Lab Results  Component Value Date   LDLCALC 65 01/04/2012   LDLCALC 112* 10/19/2010   LDLCALC 83 10/06/2009   Lab Results  Component Value Date   TRIG 174* 01/04/2012   TRIG 102 10/19/2010   TRIG 149.0 10/06/2009   Lab Results  Component Value Date   CHOLHDL 3.9 01/04/2012   CHOLHDL 4.1 Ratio 10/19/2010   CHOLHDL 4 10/06/2009    Echocardiogram 10/25/2011  Mild left ventricular dilatation with a small segmental wall motion abnormality at the base of  the inferoseptal wall and normal overall LV systolic function; no significant valvular abnormalities.  Carotid Doppler Study 2012 Significant plaque bilaterally; >70% right ICA stenosis,   Dg Chest 10/23/2012   Right greater than left peri/infrahilar interstitial and airspace opacities may reflect asymmetric edema or infection.     EKG:NSR rate 92 bpm; possible LVH with repolarization abnormality; prolonged QT interval; when compared to previous tracing of 08/12/2012, ST segment abnormalities are slightly more impressive.  ASSESSMENT AND PLAN:   1. Acute on Chronic Systolic CHF: Likely related to profound anemia in the setting of Hgb of 6.3. He has no further LEE, and is basically  asymptomatic at present. He has continued crackles in the lung bases. He has not required treatment with a diuretic as an outpatient.  May need to consider PRN use on discharge.   2. CAD: History of CABG in 1996 with normal nuclear study in 2010. Doubt need to repeat ischemic testing, unless significant decrease in systolic function. Continue metoprolol 25 mg BID.  3. History of Atrial fib: Review of EKG through 2011 demonstrated NSR maintained, with use of low dose amiodarone. He has not been treated with warfarin due to his blood dyscrasia resulting in anemia.  4. MDS with 5Q syndrome with complications of severe anemia:  Followed by oncology with blood transfusions X 4 this admission.  5. Carotid Artery disease: Most recent doppler study in 2012 demonstrating less than near occlusion of RICA with >70% stenosis. Repeat doppler study.  6. COPD 7. Hypertension 8. Diabetes 9. GERD  Bettey Mare. Lyman Bishop NP [facility] 10/24/2012, 9:26 AM  Cardiology Attending Patient interviewed and examined. Discussed with Joni Reining, NP.  Above note annotated and modified based upon my findings.  Presentation with CHF despite preserved left ventricular systolic function. Mechanism may be as a result of high cardiac output related to anemia.  Patient reports polyuria since furosemide started on admission, but I&O is positive.  No urine output recorded since midnight, and output is likely underestimated. Symptoms have improved as has lower extremity edema, but the latter is likely at least partially due to leg elevation. I suspect that he requires additional diuresis, but this may result in further impairment of renal function.  Chest x-ray and weight will be reassessed in the morning and additional furosemide utilized as necessary.  Hypokalemia: Potassium level was low this morning; replacement will be provided.  Cerebrovascular disease: In the setting of severe chronic hematologic disease,  carotid endarterectomy or stenting for severe asymptomatic disease probably does not have a favorable risk-benefit ratio.  [place], MD 10/24/2012, 10:52 PM

## 2012-10-24 NOTE — Progress Notes (Signed)
Patient's second unit of blood was finished at 0500. A CBC was already ordered for 0600 this morning to check HGB.

## 2012-10-25 ENCOUNTER — Inpatient Hospital Stay (HOSPITAL_COMMUNITY): Payer: Medicare Other

## 2012-10-25 DIAGNOSIS — I5083 High output heart failure: Secondary | ICD-10-CM | POA: Diagnosis present

## 2012-10-25 LAB — BASIC METABOLIC PANEL
BUN: 24 mg/dL — ABNORMAL HIGH (ref 6–23)
CO2: 29 mEq/L (ref 19–32)
Calcium: 8.8 mg/dL (ref 8.4–10.5)
Creatinine, Ser: 1.4 mg/dL — ABNORMAL HIGH (ref 0.50–1.35)
GFR calc non Af Amer: 47 mL/min — ABNORMAL LOW (ref >90)
Glucose, Bld: 126 mg/dL — ABNORMAL HIGH (ref 70–99)

## 2012-10-25 MED ORDER — FUROSEMIDE 40 MG PO TABS: 40.0000 mg | ORAL_TABLET | Freq: Every day | ORAL | Status: DC

## 2012-10-25 MED ORDER — AZITHROMYCIN 250 MG PO TABS: ORAL_TABLET | ORAL | Status: AC

## 2012-10-25 NOTE — Progress Notes (Signed)
Subjective: He says he feels well and wants to go home. His chest x-ray this morning is pending. He saw his vascular surgeon not long ago but his carotid study shows progression of his carotid disease compared to the last study here. I will discuss with his vascular surgeon to be sure we don't need to do anything. His shortness of breath has resolved  Objective: Vital signs in last 24 hours: Temp:  [97.5 F (36.4 C)-98.6 F (37 C)] 98 F (36.7 C) (01/29 0527) Pulse Rate:  [60-64] 61  (01/29 0527) Resp:  [20] 20  (01/29 0527) BP: (102-130)/(42-63) 102/42 mmHg (01/29 0527) SpO2:  [94 %-97 %] 94 % (01/29 0527) Weight change:  Last BM Date: 10/24/12  Intake/Output from previous day: 01/28 0701 - 01/29 0700 In: 480 [P.O.:480] Out: -   PHYSICAL EXAM General appearance: alert, cooperative and no distress Resp: clear to auscultation bilaterally Cardio: irregularly irregular rhythm GI: soft, non-tender; bowel sounds normal; no masses,  no organomegaly Extremities: Trace edema  Lab Results:    Basic Metabolic Panel:  Basename 10/25/12 0408 10/24/12 0634  NA 138 140  K 3.4* 3.3*  CL 100 98  CO2 29 30  GLUCOSE 126* 128*  BUN 24* 25*  CREATININE 1.40* 1.55*  CALCIUM 8.8 9.1  MG -- --  PHOS -- --   Liver Function Tests: No results found for this basename: AST:2,ALT:2,ALKPHOS:2,BILITOT:2,PROT:2,ALBUMIN:2 in the last 72 hours No results found for this basename: LIPASE:2,AMYLASE:2 in the last 72 hours No results found for this basename: AMMONIA:2 in the last 72 hours CBC:  Basename 10/24/12 0634 10/23/12 1951 10/23/12 0534  WBC 4.2 4.7 --  NEUTROABS -- 2.4 1.1*  HGB 9.8* 8.5* --  HCT 28.1* 24.4* --  MCV 86.2 86.5 --  PLT 44* 47* --   Cardiac Enzymes:  Basename 10/23/12 0534  CKTOTAL --  CKMB --  CKMBINDEX --  TROPONINI <0.30   BNP:  Basename 10/23/12 0534  PROBNP 3246.0*   D-Dimer: No results found for this basename: DDIMER:2 in the last 72 hours CBG: No  results found for this basename: GLUCAP:6 in the last 72 hours Hemoglobin A1C: No results found for this basename: HGBA1C in the last 72 hours Fasting Lipid Panel: No results found for this basename: CHOL,HDL,LDLCALC,TRIG,CHOLHDL,LDLDIRECT in the last 72 hours Thyroid Function Tests: No results found for this basename: TSH,T4TOTAL,FREET4,T3FREE,THYROIDAB in the last 72 hours Anemia Panel: No results found for this basename: VITAMINB12,FOLATE,FERRITIN,TIBC,IRON,RETICCTPCT in the last 72 hours Coagulation: No results found for this basename: LABPROT:2,INR:2 in the last 72 hours Urine Drug Screen: Drugs of Abuse  No results found for this basename: labopia, cocainscrnur, labbenz, amphetmu, thcu, labbarb    Alcohol Level: No results found for this basename: ETH:2 in the last 72 hours Urinalysis: No results found for this basename: COLORURINE:2,APPERANCEUR:2,LABSPEC:2,PHURINE:2,GLUCOSEU:2,HGBUR:2,BILIRUBINUR:2,KETONESUR:2,PROTEINUR:2,UROBILINOGEN:2,NITRITE:2,LEUKOCYTESUR:2 in the last 72 hours Misc. Labs:  ABGS No results found for this basename: PHART,PCO2,PO2ART,TCO2,HCO3 in the last 72 hours CULTURES Recent Results (from the past 240 hour(s))  MRSA PCR SCREENING     Status: Normal   Collection Time   10/25/12  4:42 AM      Component Value Range Status Comment   MRSA by PCR NEGATIVE  NEGATIVE Final    Studies/Results: US Carotid Duplex Bilateral  10/24/2012  *RADIOLOGY REPORT*  Clinical Data: Internal carotid artery stenosis.  BILATERAL CAROTID DUPLEX ULTRASOUND  Technique: Wallace Cullens scale imaging, color Doppler and duplex ultrasound was performed of bilateral carotid and vertebral arteries in the neck.  Comparison:  10/29/2010  Criteria:  Quantification of carotid stenosis is based on velocity parameters that correlate the residual internal carotid diameter with NASCET-based stenosis levels, using the diameter of the distal internal carotid lumen as the denominator for stenosis measurement.   The following velocity measurements were obtained:                   PEAK SYSTOLIC/END DIASTOLIC RIGHT ICA:                        399cm/sec CCA:                        117cm/sec SYSTOLIC ICA/CCA RATIO:     3.4 DIASTOLIC ICA/CCA RATIO: ECA:                        113cm/sec  LEFT ICA:                        196cm/sec CCA:                        115cm/sec SYSTOLIC ICA/CCA RATIO:     1.7 DIASTOLIC ICA/CCA RATIO: ECA:                        224cm/sec  Findings:  RIGHT CAROTID ARTERY: Extensive plaque throughout the upper common carotid, internal carotid bulb and lower internal carotid.  There is plaque in the external carotid.  Low resistance internal carotid Doppler pattern with sharp upstroke.  RIGHT VERTEBRAL ARTERY:  Antegrade.  LEFT CAROTID ARTERY: Moderate mixed plaque in the left carotid bulb.  Low resistance Doppler pattern with sharp upstroke.  LEFT VERTEBRAL ARTERY:  Antegrade.  IMPRESSION: Severe near occlusive narrowing of the right internal carotid artery.  This has progressed since the prior study.  50-69% stenosis in the left internal carotid artery.  This has also progressed.   Original Report Authenticated By: Jolaine Click, M.D.     Medications:  Prior to Admission:  Prescriptions prior to admission  Medication Sig Dispense Refill  . acetaminophen (TYLENOL) 500 MG tablet Take 1 tablet (500 mg total) by mouth every 6 (six) hours as needed. Pain  30 tablet    . amiodarone (PACERONE) 200 MG tablet Take 0.5 tablets (100 mg total) by mouth daily.  30 tablet  5  . amLODipine (NORVASC) 5 MG tablet Take 5 mg by mouth daily.      Marland Kitchen aspirin 81 MG tablet Take 81 mg by mouth daily.      . diazepam (VALIUM) 5 MG tablet Take 5 mg by mouth every 6 (six) hours as needed. Muscles Spasms      . HYDROcodone-acetaminophen (NORCO) 5-325 MG per tablet Take 1 tablet by mouth every 6 (six) hours as needed. Pain      . lenalidomide (REVLIMID) 5 MG capsule Take 1 capsule (5 mg total) by mouth daily.  21 capsule  3    . metFORMIN (GLUCOPHAGE) 500 MG tablet Take 500 mg by mouth 2 (two) times daily with a meal.      . metoprolol tartrate (LOPRESSOR) 25 MG tablet Take 25 mg by mouth 2 (two) times daily.        . Multiple Vitamin (MULTIVITAMIN) tablet Take 1 tablet by mouth daily.        . nitroGLYCERIN (NITROSTAT) 0.4 MG SL tablet Place 1 tablet (  0.4 mg total) under the tongue every 5 (five) minutes x 3 doses as needed for chest pain (up to 3 doses).  25 tablet  3  . pregabalin (LYRICA) 75 MG capsule Take 75 mg by mouth daily.      . Red Yeast Rice 600 MG TABS Take 1 tablet by mouth daily.        . silodosin (RAPAFLO) 8 MG CAPS capsule Take 8 mg by mouth daily with breakfast.         Scheduled:   . amiodarone  100 mg Oral Daily  . amLODipine  5 mg Oral Daily  . antiseptic oral rinse  15 mL Mouth Rinse q12n4p  . azithromycin  250 mg Oral Daily  . chlorhexidine  15 mL Mouth Rinse BID  . docusate sodium  100 mg Oral BID  . furosemide  40 mg Intravenous BID  . lenalidomide  5 mg Oral Daily  . metoprolol tartrate  25 mg Oral BID  . potassium chloride  40 mEq Oral BID  . sodium chloride  3 mL Intravenous Q12H  . Tamsulosin HCl  0.4 mg Oral QPC supper   Continuous:  WUJ:[id number], acetaminophen, alum & mag hydroxide-simeth, diazepam, HYDROcodone-acetaminophen, morphine injection, nitroGLYCERIN, ondansetron (ZOFRAN) IV, ondansetron, zolpidem  Assesment: He was admitted with what seems to be diastolic congestive heart failure related to anemia. He had some chest discomfort but no evidence that he had myocardial infarction Melvern Banker. His anemia is from his myelodysplastic syndrome. He is much improved after blood transfusion. He has carotid artery stenosis. Active Problems:  CAD  PVD  CAROTID ARTERY STENOSIS  Anemia  Myelodysplastic syndrome with 5 q minus    Plan: I think he'll be able to be discharged later today. He does have a chest x-ray pending. He is going to need close followup as far as his  hemoglobin level and that is being arranged with his oncologist to have him checked twice a week    LOS: 2 days   [name] L 10/25/2012, 8:53 AM

## 2012-10-25 NOTE — Plan of Care (Signed)
Problem: Discharge Progression Outcomes Goal: Discharge plan in place and appropriate Outcome: Adequate for Discharge Pt d/c home with family

## 2012-10-25 NOTE — Discharge Summary (Signed)
Physician Discharge Summary  Patient ID: [name] MRN: [id number] DOB/AGE: [birth year] 79 y.o. Primary Care Physician:Cortnie Ringel L, MD Admit date: 10/23/2012 Discharge date: 10/25/2012    Discharge Diagnoses:   Principal Problem:  *Anemia Active Problems:  CAD  Atrial fibrillation  PVD  CAROTID ARTERY STENOSIS  Myelodysplastic syndrome with 5 q minus  High output heart failure     Medication List     As of 10/25/2012  1:58 PM    TAKE these medications         acetaminophen 500 MG tablet   Commonly known as: TYLENOL   Take 1 tablet (500 mg total) by mouth every 6 (six) hours as needed. Pain      amiodarone 200 MG tablet   Commonly known as: PACERONE   Take 0.5 tablets (100 mg total) by mouth daily.      amLODipine 5 MG tablet   Commonly known as: NORVASC   Take 5 mg by mouth daily.      aspirin 81 MG tablet   Take 81 mg by mouth daily.      azithromycin 250 MG tablet   Commonly known as: ZITHROMAX   Take 2 tablets (500 mg) on  Day 1,  followed by 1 tablet (250 mg) once daily on Days 2 through 5.      diazepam 5 MG tablet   Commonly known as: VALIUM   Take 5 mg by mouth every 6 (six) hours as needed. Muscles Spasms      furosemide 40 MG tablet   Commonly known as: LASIX   Take 1 tablet (40 mg total) by mouth daily.      HYDROcodone-acetaminophen 5-325 MG per tablet   Commonly known as: NORCO/VICODIN   Take 1 tablet by mouth every 6 (six) hours as needed. Pain      lenalidomide 5 MG capsule   Commonly known as: REVLIMID   Take 1 capsule (5 mg total) by mouth daily.      metFORMIN 500 MG tablet   Commonly known as: GLUCOPHAGE   Take 500 mg by mouth 2 (two) times daily with a meal.      metoprolol tartrate 25 MG tablet   Commonly known as: LOPRESSOR   Take 25 mg by mouth 2 (two) times daily.      multivitamin tablet   Take 1 tablet by mouth daily.      nitroGLYCERIN 0.4 MG SL tablet   Commonly known as: NITROSTAT   Place 1 tablet (0.4  mg total) under the tongue every 5 (five) minutes x 3 doses as needed for chest pain (up to 3 doses).      pregabalin 75 MG capsule   Commonly known as: LYRICA   Take 75 mg by mouth daily.      RAPAFLO 8 MG Caps capsule   Generic drug: silodosin   Take 8 mg by mouth daily with breakfast.      Red Yeast Rice 600 MG Tabs   Take 1 tablet by mouth daily.        Discharged Condition:improved    Consults:oncology/cardiology  Significant Diagnostic Studies: Dg Chest 2 View  10/25/2012  *RADIOLOGY REPORT*  Clinical Data: Congestive heart failure.  CHEST - 2 VIEW  Comparison: October 23, 2012.  Findings: Cardiomediastinal silhouette appears normal.  Sternotomy wires are noted.  No acute abnormality seen in right lung.  Linear densities seen in lateral portion of left lung base most consistent with small focus of subsegmental atelectasis.  IMPRESSION: Probable small focus of subsegmental atelectasis seen in left lung base.  No other acute abnormality seen.   Original Report Authenticated By: Lupita Raider.,  M.D.    US Carotid Duplex Bilateral  10/24/2012  *RADIOLOGY REPORT*  Clinical Data: Internal carotid artery stenosis.  BILATERAL CAROTID DUPLEX ULTRASOUND  Technique: Wallace Cullens scale imaging, color Doppler and duplex ultrasound was performed of bilateral carotid and vertebral arteries in the neck.  Comparison:  10/29/2010  Criteria:  Quantification of carotid stenosis is based on velocity parameters that correlate the residual internal carotid diameter with NASCET-based stenosis levels, using the diameter of the distal internal carotid lumen as the denominator for stenosis measurement.  The following velocity measurements were obtained:                   PEAK SYSTOLIC/END DIASTOLIC RIGHT ICA:                        399cm/sec CCA:                        117cm/sec SYSTOLIC ICA/CCA RATIO:     3.4 DIASTOLIC ICA/CCA RATIO: ECA:                        113cm/sec  LEFT ICA:                        196cm/sec  CCA:                        115cm/sec SYSTOLIC ICA/CCA RATIO:     1.7 DIASTOLIC ICA/CCA RATIO: ECA:                        224cm/sec  Findings:  RIGHT CAROTID ARTERY: Extensive plaque throughout the upper common carotid, internal carotid bulb and lower internal carotid.  There is plaque in the external carotid.  Low resistance internal carotid Doppler pattern with sharp upstroke.  RIGHT VERTEBRAL ARTERY:  Antegrade.  LEFT CAROTID ARTERY: Moderate mixed plaque in the left carotid bulb.  Low resistance Doppler pattern with sharp upstroke.  LEFT VERTEBRAL ARTERY:  Antegrade.  IMPRESSION: Severe near occlusive narrowing of the right internal carotid artery.  This has progressed since the prior study.  50-69% stenosis in the left internal carotid artery.  This has also progressed.   Original Report Authenticated By: Jolaine Click, M.D.    Dg Chest Port 1 View  10/23/2012  *RADIOLOGY REPORT*  Clinical Data: Shortness of breath  PORTABLE CHEST - 1 VIEW  Comparison: 08/15/2012  Findings: Right greater than left peri/infrahilar interstitial and airspace opacities.  Layering trace fluid on the right is not excluded.  Postoperative changes of median sternotomy and CABG. Cardiomediastinal contours are unchanged, with central vascular congestion. No acute osseous finding.  IMPRESSION: Right greater than left peri/infrahilar interstitial and airspace opacities may reflect asymmetric edema or infection.   Original Report Authenticated By: Jearld Lesch, M.D.     Lab Results: Basic Metabolic Panel:  Basename 10/25/12 0408 10/24/12 0634  NA 138 140  K 3.4* 3.3*  CL 100 98  CO2 29 30  GLUCOSE 126* 128*  BUN 24* 25*  CREATININE 1.40* 1.55*  CALCIUM 8.8 9.1  MG -- --  PHOS -- --   Liver Function Tests: No results found for this basename: AST:2,ALT:2,ALKPHOS:2,BILITOT:2,PROT:2,ALBUMIN:2 in the last 72  hours   CBC:  Basename 10/24/12 0634 10/23/12 1951 10/23/12 0534  WBC 4.2 4.7 --  NEUTROABS -- 2.4 1.1*    HGB 9.8* 8.5* --  HCT 28.1* 24.4* --  MCV 86.2 86.5 --  PLT 44* 47* --    Recent Results (from the past 240 hour(s))  MRSA PCR SCREENING     Status: Normal   Collection Time   10/25/12  4:42 AM      Component Value Range Status Comment   MRSA by PCR NEGATIVE  NEGATIVE Final      Hospital Course: he came to the emergency room with severe shortness of breath. He was very anemic and appeared to have CHF/pulmonary edema by chest x-ray. He has a blood dyscrasia and has required multiple transfusions. He was given 4 units of blood and had consultation with cardiology and oncology. Echocardiogram showed improved and normal left ventricular function. He had potassium replacement. He was given Lasix and his shortness of breath improved with a blood transfusion and Lasix.  Discharge Exam: Blood pressure 118/49, pulse 62, temperature 97.6 F (36.4 C), temperature source Oral, resp. rate 18, height 5\' 10"  (1.778 m), weight 93.441 kg (206 lb), SpO2 100.00%. He is awake and alert his chest is clear. He is not short of breath with exertion  Disposition: home he will have twice weekly checks on his blood count and transfusions as needed until his chemotherapy starts working better      Discharge Orders    Future Appointments: Provider: Department: Dept Phone: Center:   10/27/2012 8:30 AM [facility] CANCER CENTER [phone] None   10/30/2012 10:20 AM [facility] CENTER [phone] None   11/02/2012 10:20 AM [facility]CER CENTER [phone] None   11/06/2012 10:50 AM [facility] CANCER CENTER [phone] None   11/09/2012 9:50 AM [facility]CER CENTER [phone] None   11/10/2012 8:30 AM [facility] CENTER [phone] None   11/13/2012 11:40 AM [facility] CANCER CENTER [phone] None   11/16/2012 9:50 AM [facility] CANCER CENTER [phone] None   11/16/2012 10:30 AM Ellouise Newer, PA [facility] CANCER CENTER [phone] None   12/06/2012 1:30 PM [facility] [phone] [facility]   12/06/2012 2:40 PM Evern Bio, NP [facility] [phone] [facility]   12/15/2012 12:30 PM Randall An, MD [facility] CENTER [phone] None   06/13/2013 1:00 PM [facility] [phone] VVS   06/13/2013 2:00 PM Chuck Hint, MD [facility] [phone] VVS     Future Orders Please Complete By Expires   Discharge patient         Follow-up Information    Follow up with Randall An, MD. (as scheduled)    Contact information:   [address] [place] [address] [phone]       Call [facility]. (Labs Mondays and Thursdays)    Contact information:   [address] [place] [address]          Signed: Tommy Rainwater [phone]  10/25/2012, 1:58 PM

## 2012-10-27 ENCOUNTER — Encounter (HOSPITAL_BASED_OUTPATIENT_CLINIC_OR_DEPARTMENT_OTHER): Payer: Medicare Other

## 2012-10-27 ENCOUNTER — Other Ambulatory Visit (HOSPITAL_COMMUNITY): Payer: Medicare Other

## 2012-10-27 DIAGNOSIS — D46C Myelodysplastic syndrome with isolated del(5q) chromosomal abnormality: Secondary | ICD-10-CM

## 2012-10-27 DIAGNOSIS — D649 Anemia, unspecified: Secondary | ICD-10-CM

## 2012-10-27 LAB — TYPE AND SCREEN: Unit division: 0

## 2012-10-27 LAB — COMPREHENSIVE METABOLIC PANEL
ALT: 16 U/L (ref 0–53)
Albumin: 3.6 g/dL (ref 3.5–5.2)
BUN: 28 mg/dL — ABNORMAL HIGH (ref 6–23)
Calcium: 9 mg/dL (ref 8.4–10.5)
GFR calc Af Amer: 48 mL/min — ABNORMAL LOW (ref >90)
Glucose, Bld: 129 mg/dL — ABNORMAL HIGH (ref 70–99)
Sodium: 139 mEq/L (ref 135–145)
Total Protein: 7.3 g/dL (ref 6.0–8.3)

## 2012-10-27 LAB — CBC WITH DIFFERENTIAL/PLATELET
Basophils Relative: 12 % — ABNORMAL HIGH (ref 0–1)
Eosinophils Absolute: 1 10*3/uL — ABNORMAL HIGH (ref 0.0–0.7)
Eosinophils Relative: 27 % — ABNORMAL HIGH (ref 0–5)
Lymphs Abs: 1.3 10*3/uL (ref 0.7–4.0)
MCH: 30 pg (ref 26.0–34.0)
MCHC: 34.3 g/dL (ref 30.0–36.0)
MCV: 87.3 fL (ref 78.0–100.0)
Neutrophils Relative %: 24 % — ABNORMAL LOW (ref 43–77)
Platelets: 36 10*3/uL — ABNORMAL LOW (ref 150–400)
RBC: 3.47 MIL/uL — ABNORMAL LOW (ref 4.22–5.81)

## 2012-10-27 NOTE — Progress Notes (Signed)
Labs drawn today for cbc/diff,cmp 

## 2012-10-30 ENCOUNTER — Encounter (HOSPITAL_COMMUNITY): Payer: Medicare Other | Attending: Oncology

## 2012-10-30 DIAGNOSIS — D649 Anemia, unspecified: Secondary | ICD-10-CM | POA: Insufficient documentation

## 2012-10-30 DIAGNOSIS — D46C Myelodysplastic syndrome with isolated del(5q) chromosomal abnormality: Secondary | ICD-10-CM | POA: Insufficient documentation

## 2012-10-30 LAB — CBC WITH DIFFERENTIAL/PLATELET
Eosinophils Relative: 21 % — ABNORMAL HIGH (ref 0–5)
HCT: 28.3 % — ABNORMAL LOW (ref 39.0–52.0)
Hemoglobin: 9.6 g/dL — ABNORMAL LOW (ref 13.0–17.0)
Lymphocytes Relative: 34 % (ref 12–46)
MCV: 87.3 fL (ref 78.0–100.0)
Monocytes Absolute: 0.1 10*3/uL (ref 0.1–1.0)
Monocytes Relative: 3 % (ref 3–12)
Neutro Abs: 0.9 10*3/uL — ABNORMAL LOW (ref 1.7–7.7)
WBC: 3.5 10*3/uL — ABNORMAL LOW (ref 4.0–10.5)

## 2012-10-30 NOTE — Progress Notes (Signed)
Labs drawn today for cbc/diff 

## 2012-10-31 ENCOUNTER — Other Ambulatory Visit (HOSPITAL_COMMUNITY): Payer: Self-pay | Admitting: Oncology

## 2012-10-31 DIAGNOSIS — D46C Myelodysplastic syndrome with isolated del(5q) chromosomal abnormality: Secondary | ICD-10-CM

## 2012-10-31 MED ORDER — LENALIDOMIDE 5 MG PO CAPS: 5.0000 mg | ORAL_CAPSULE | Freq: Every day | ORAL | Status: DC

## 2012-11-02 ENCOUNTER — Encounter (HOSPITAL_BASED_OUTPATIENT_CLINIC_OR_DEPARTMENT_OTHER): Payer: Medicare Other

## 2012-11-02 DIAGNOSIS — D649 Anemia, unspecified: Secondary | ICD-10-CM

## 2012-11-02 LAB — CBC
HCT: 26.9 % — ABNORMAL LOW (ref 39.0–52.0)
Hemoglobin: 9.4 g/dL — ABNORMAL LOW (ref 13.0–17.0)
MCH: 30.1 pg (ref 26.0–34.0)
MCV: 86.2 fL (ref 78.0–100.0)
RBC: 3.12 MIL/uL — ABNORMAL LOW (ref 4.22–5.81)

## 2012-11-02 LAB — DIFFERENTIAL
Eosinophils Absolute: 0.6 10*3/uL (ref 0.0–0.7)
Eosinophils Relative: 14 % — ABNORMAL HIGH (ref 0–5)
Lymphs Abs: 1.6 10*3/uL (ref 0.7–4.0)
Monocytes Absolute: 0.1 10*3/uL (ref 0.1–1.0)
Monocytes Relative: 3 % (ref 3–12)

## 2012-11-02 NOTE — Progress Notes (Signed)
Labs drawn today for cbc,diff 

## 2012-11-06 ENCOUNTER — Other Ambulatory Visit (HOSPITAL_COMMUNITY): Payer: Medicare Other

## 2012-11-07 ENCOUNTER — Encounter (HOSPITAL_BASED_OUTPATIENT_CLINIC_OR_DEPARTMENT_OTHER): Payer: Medicare Other

## 2012-11-07 DIAGNOSIS — D46C Myelodysplastic syndrome with isolated del(5q) chromosomal abnormality: Secondary | ICD-10-CM

## 2012-11-07 DIAGNOSIS — D649 Anemia, unspecified: Secondary | ICD-10-CM

## 2012-11-07 NOTE — Addendum Note (Signed)
Addended byLeida Lauth on: 11/07/2012 11:56 AM   Modules accepted: Orders

## 2012-11-07 NOTE — Progress Notes (Signed)
Labs drawn today for cbc/diff 

## 2012-11-07 NOTE — Addendum Note (Signed)
Addended by: Ellouise Newer on: 11/07/2012 11:54 AM   Modules accepted: Orders, SmartSet

## 2012-11-09 ENCOUNTER — Other Ambulatory Visit (HOSPITAL_COMMUNITY): Payer: Medicare Other

## 2012-11-10 ENCOUNTER — Other Ambulatory Visit (HOSPITAL_COMMUNITY): Payer: Medicare Other

## 2012-11-10 ENCOUNTER — Encounter (HOSPITAL_BASED_OUTPATIENT_CLINIC_OR_DEPARTMENT_OTHER): Payer: Medicare Other

## 2012-11-10 VITALS — BP 130/45 | HR 61 | Temp 98.3°F | Resp 18

## 2012-11-10 DIAGNOSIS — D649 Anemia, unspecified: Secondary | ICD-10-CM

## 2012-11-10 DIAGNOSIS — D46C Myelodysplastic syndrome with isolated del(5q) chromosomal abnormality: Secondary | ICD-10-CM

## 2012-11-10 LAB — CBC WITH DIFFERENTIAL/PLATELET
Basophils Absolute: 1.1 10*3/uL — ABNORMAL HIGH (ref 0.0–0.1)
Basophils Relative: 29 % — ABNORMAL HIGH (ref 0–1)
HCT: 19.9 % — ABNORMAL LOW (ref 39.0–52.0)
Hemoglobin: 7 g/dL — ABNORMAL LOW (ref 13.0–17.0)
Lymphocytes Relative: 26 % (ref 12–46)
MCHC: 35.2 g/dL (ref 30.0–36.0)
Monocytes Relative: 5 % (ref 3–12)
Neutro Abs: 1.1 10*3/uL — ABNORMAL LOW (ref 1.7–7.7)
Neutrophils Relative %: 29 % — ABNORMAL LOW (ref 43–77)
WBC: 3.8 10*3/uL — ABNORMAL LOW (ref 4.0–10.5)

## 2012-11-10 MED ORDER — SODIUM CHLORIDE 0.9 % IJ SOLN
10.0000 mL | INTRAMUSCULAR | Status: DC | PRN
  Filled 2012-11-10: qty 10

## 2012-11-10 MED ORDER — SODIUM CHLORIDE 0.9 % IV SOLN: 250.0000 mL | Freq: Once | INTRAVENOUS | Status: DC

## 2012-11-12 LAB — TYPE AND SCREEN: Unit division: 0

## 2012-11-13 ENCOUNTER — Other Ambulatory Visit (HOSPITAL_COMMUNITY): Payer: Self-pay | Admitting: Oncology

## 2012-11-13 ENCOUNTER — Encounter (HOSPITAL_BASED_OUTPATIENT_CLINIC_OR_DEPARTMENT_OTHER): Payer: Medicare Other

## 2012-11-13 DIAGNOSIS — D649 Anemia, unspecified: Secondary | ICD-10-CM

## 2012-11-13 NOTE — Progress Notes (Signed)
Hgb 8.9

## 2012-11-13 NOTE — Addendum Note (Signed)
Addended by: Edythe Lynn A on: 11/13/2012 12:35 PM   Modules accepted: Kipp Brood

## 2012-11-13 NOTE — Progress Notes (Signed)
Labs drawn today for cbc/diff 

## 2012-11-14 LAB — CBC
HCT: 26 % — ABNORMAL LOW (ref 39.0–52.0)
Hemoglobin: 8.9 g/dL — ABNORMAL LOW (ref 13.0–17.0)
MCH: 28.8 pg (ref 26.0–34.0)
RBC: 3.09 MIL/uL — ABNORMAL LOW (ref 4.22–5.81)

## 2012-11-14 LAB — DIFFERENTIAL
Lymphs Abs: 1.3 10*3/uL (ref 0.7–4.0)
Monocytes Absolute: 0.2 10*3/uL (ref 0.1–1.0)
Monocytes Relative: 3 % (ref 3–12)
Neutro Abs: 1.3 10*3/uL — ABNORMAL LOW (ref 1.7–7.7)
Neutrophils Relative %: 26 % — ABNORMAL LOW (ref 43–77)

## 2012-11-16 ENCOUNTER — Encounter (HOSPITAL_COMMUNITY): Payer: Self-pay | Admitting: Oncology

## 2012-11-16 ENCOUNTER — Encounter (HOSPITAL_BASED_OUTPATIENT_CLINIC_OR_DEPARTMENT_OTHER): Payer: Medicare Other

## 2012-11-16 ENCOUNTER — Encounter (HOSPITAL_BASED_OUTPATIENT_CLINIC_OR_DEPARTMENT_OTHER): Payer: Medicare Other | Admitting: Oncology

## 2012-11-16 VITALS — BP 129/56 | HR 59 | Temp 97.0°F | Resp 18 | Wt 205.4 lb

## 2012-11-16 DIAGNOSIS — D46C Myelodysplastic syndrome with isolated del(5q) chromosomal abnormality: Secondary | ICD-10-CM

## 2012-11-16 DIAGNOSIS — D696 Thrombocytopenia, unspecified: Secondary | ICD-10-CM

## 2012-11-16 DIAGNOSIS — D649 Anemia, unspecified: Secondary | ICD-10-CM

## 2012-11-16 LAB — DIFFERENTIAL
Eosinophils Absolute: 0.7 10*3/uL (ref 0.0–0.7)
Eosinophils Relative: 17 % — ABNORMAL HIGH (ref 0–5)
Lymphocytes Relative: 29 % (ref 12–46)
Lymphs Abs: 1.2 10*3/uL (ref 0.7–4.0)
Monocytes Absolute: 0.1 10*3/uL (ref 0.1–1.0)
Monocytes Relative: 3 % (ref 3–12)

## 2012-11-16 LAB — CBC
Hemoglobin: 7.6 g/dL — ABNORMAL LOW (ref 13.0–17.0)
MCH: 28.8 pg (ref 26.0–34.0)
MCHC: 33.8 g/dL (ref 30.0–36.0)
MCV: 85.2 fL (ref 78.0–100.0)
RBC: 2.64 MIL/uL — ABNORMAL LOW (ref 4.22–5.81)

## 2012-11-16 NOTE — Addendum Note (Signed)
Addended by: Hooper Petteway S on: 11/16/2012 12:07 PM   Modules accepted: Level of Service  

## 2012-11-16 NOTE — Addendum Note (Signed)
Addended byLeida Lauth on: 11/16/2012 11:28 AM   Modules accepted: Orders

## 2012-11-16 NOTE — Patient Instructions (Addendum)
.  [facility] Discharge Instructions  RECOMMENDATIONS MADE BY THE CONSULTANT AND ANY TEST RESULTS WILL BE SENT TO YOUR REFERRING PHYSICIAN.  EXAM FINDINGS BY THE PHYSICIAN TODAY AND SIGNS OR SYMPTOMS TO REPORT TO CLINIC OR PRIMARY PHYSICIAN:  We need to transfuse you tomorrow. Be here at 0845 am.   INSTRUCTIONS GIVEN AND DISCUSSED: Labs every Monday and Thursday  SPECIAL INSTRUCTIONS/FOLLOW-UP: See Dr. Mariel Sleet as scheduled.  Thank you for choosing [facility] to provide your oncology and hematology care.  To afford each patient quality time with our providers, please arrive at least 15 minutes before your scheduled appointment time.  With your help, our goal is to use those 15 minutes to complete the necessary work-up to ensure our physicians have the information they need to help with your evaluation and healthcare recommendations.    Effective January 1st, 2014, we ask that you re-schedule your appointment with our physicians should you arrive 10 or more minutes late for your appointment.  We strive to give you quality time with our providers, and arriving late affects you and other patients whose appointments are after yours.    Again, thank you for choosing [facility].  Our hope is that these requests will decrease the amount of time that you wait before being seen by our physicians.       _____________________________________________________________  Should you have questions after your visit to [facility], please contact our office at [phone] between the hours of 8:30 a.m. and 5:00 p.m.  Voicemails left after 4:30 p.m. will not be returned until the following business day.  For prescription refill requests, have your pharmacy contact our office with your prescription refill request.

## 2012-11-16 NOTE — Progress Notes (Signed)
Labs drawn today for cbc/diff 

## 2012-11-16 NOTE — Progress Notes (Signed)
Caleb Maudlin, MD [address] [place] [address]  Myelodysplastic syndrome with 5 q minus  CURRENT THERAPY:Revlimid 5 mg daily, 21 days on and 7 days off  INTERVAL HISTORY: [name] 79 y.o. male returns for  regular  visit for followup of MDS with 5 q- syndrome. On Revlimid 5 mg daily 21 days on and 7 days off.  Presently, Chest has 10 more days of this cycle before he starts his 7 day break.   Chest is doing well.  He looks pale today.   I personally reviewed and went over laboratory results with the patient.  Hgb is 7.6 and he will therefore require a 2 units PRBC.  Blood will need to be irradiated.  Due to the timing, this will be administered tomorrow.  Our goal is to keep his Hgb above 8 g. Platelet count is stable.  I spent some time educating the patient on his 5q- syndrome and its improved Prognosis.    We discussed his plan of supportive care with RBC and platelet support when needed.   Hematologically, he denies any complaints and ROS questioning is negative.     Past Medical History  Diagnosis Date  . Hypertension   . COPD (chronic obstructive pulmonary disease)   . CAD (coronary artery disease)     s/p 4V CABG '96, Myoview '10 w/o ischemia  . Arthritis   . Atrial fibrillation     coumadin stopped 12/2011 2/2 anemia  . Dysphagia   . Anemia     requiring blood transfusions  . Carotid stenosis     Dr Waverly Ferrari follows.   . Diabetes mellitus, type 2     A1C 6.7% 12/2011  . HLD (hyperlipidemia)     LDL 65 12/2011, intolerant to statins  . Hematuria     felt 2/2 foley insertion  . LV dysfunction     EF 45-50% by echo 2007  . Melena     EGD & Colonoscopy 09/2011 revealed gastritis, erosions, scars, diverticula, 8 colon polyps (largest 1.6cm, not removed 2/2 anticoagulation), small AVM in transverse colon  . History of tobacco abuse     quit 1992  . Anxiety   . Shortness of breath     exertion  . GERD (gastroesophageal  reflux disease)   . Myocardial infarction   . Thrombocytopenia 09/01/2012  . Myelodysplastic syndrome with 5 q minus 10/17/2012    On Revlimid 5 mg daily 21 days on and 7 days off    has DYSLIPIDEMIA; CAD; Atrial fibrillation; PVD; OSTEOARTHRITIS; CAROTID ARTERY STENOSIS; Long term current use of anticoagulant; GI bleeding; AVM (arteriovenous malformation) of colon without hemorrhage; Angina effort; Anemia; History of colonic polyps; Myelodysplastic syndrome with 5 q minus; and High output heart failure on his problem list.     is allergic to statins and tape.  [name] does not currently have medications on file.  Past Surgical History  Procedure Laterality Date  . Knee surgery  1960    Right knee cartilage  . Rotator cuff repair  1990    right   . Inguinal hernia repair  2000 and 2010    left  . Cholecystectomy  05/2010    Lap chole with biologic mesh repair/reinforcement by  Dr Zachery Dakins  . Back surgery  02/2006    ruptured disk,  post op diskitis infection requiring prolonged hospital stay and  surgical I & D  . Tonsillectomy    . Esophagogastroduodenoscopy  10/08/2011  Procedure: ESOPHAGOGASTRODUODENOSCOPY (EGD);  Surgeon: Malissa Hippo, MD;  [facility];  Service: Endoscopy;  Laterality: N/A;  . Colonoscopy  10/08/2011    Procedure: COLONOSCOPY;  Surgeon: Malissa Hippo, MD;  [facility]: [facility];  Service: Endoscopy;  Laterality: N/A;  . Cataract extraction, bilateral    . Peg placement  07/2006    placed due to prolonged infection, poor po intake, malnutrition during several week hospital stay resulting from ruptured disc that became infected following surgery  . Colonoscopy  01/14/2012    Procedure: COLONOSCOPY;  Surgeon: Malissa Hippo, MD;  Location: [facility];  Service: Endoscopy;  Laterality: N/A;  945  . Coronary artery bypass graft  1996    4 by-pass  . Ventral hernia repair    . Colonoscopy  04/20/2012    Procedure: COLONOSCOPY;  Surgeon:  Malissa Hippo, MD;  Location: [facility];  Service: Endoscopy;  Laterality: N/A;  1030  . Pr vein bypass graft,aorto-fem-pop  1996  . Spine surgery    . Joint replacement  1960    Right Knee  . Lymph node biopsy  08/15/2012    Procedure: LYMPH NODE BIOPSY;  Surgeon: Marlane Hatcher, MD;  Location: [facility] OR[facility];  Service: General;  Laterality: Left;  Cervical Lymph Node Bx in Minor Room  . Bone marrow biopsy    . Bone marrow aspiration      Denies any headaches, dizziness, double vision, fevers, chills, night sweats, nausea, vomiting, diarrhea, constipation, chest pain, heart palpitations, shortness of breath, blood in stool, black tarry stool, urinary pain, urinary burning, urinary frequency, hematuria.   PHYSICAL EXAMINATION  ECOG PERFORMANCE STATUS: 1 - Symptomatic but completely ambulatory  Filed Vitals:   11/16/12 1016  BP: 129/56  Pulse: 59  Temp: 97 F (36.1 C)  Resp: 18    GENERAL:alert, no distress, well nourished, well developed, comfortable, cooperative, obese and smiling  SKIN: skin color, texture, turgor are normal, no rashes or significant lesions  HEAD: Normocephalic, No masses, lesions, tenderness or abnormalities  EYES: normal, Conjunctiva are pink and non-injected  EARS: External ears normal  OROPHARYNX:mucous membranes are moist  NECK: supple, no adenopathy, thyroid normal size, non-tender, without nodularity, no stridor, non-tender, trachea midline, multiple sebaceous cysts.  LYMPH: no palpable lymphadenopathy  BREAST:not examined  LUNGS: clear to auscultation and percussion  HEART: regular rate & rhythm, no murmurs, no gallops, S1 normal and S2 normal  ABDOMEN:abdomen soft, non-tender and normal bowel sounds  BACK: Back symmetric, no curvature.  EXTREMITIES:less then 2 second capillary refill, no joint deformities, effusion, or inflammation, no edema, no skin discoloration, no clubbing, no cyanosis  NEURO: alert & oriented x 3 with fluent speech, no focal  motor/sensory deficits, gait normal   LABORATORY DATA: CBC    Component Value Date/Time   WBC 4.0 11/16/2012 0952   RBC 2.64* 11/16/2012 0952   HGB 7.6* 11/16/2012 0952   HCT 22.5* 11/16/2012 0952   PLT 33* 11/16/2012 0952   MCV 85.2 11/16/2012 0952   MCH 28.8 11/16/2012 0952   MCHC 33.8 11/16/2012 0952   RDW 14.4 11/16/2012 0952   LYMPHSABS 1.2 11/16/2012 0952   MONOABS 0.1 11/16/2012 0952   EOSABS 0.7 11/16/2012 0952   BASOSABS 0.9* 11/16/2012 0952     PATHOLOGY: 09/05/2012  Diagnosis  Bone Marrow, Aspirate,Biopsy, and Clot, left  - 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  - SEE COMMENT.  PERIPHERAL BLOOD:  -  MACROCYTIC ANEMIA.  - BASOPHILIA  - THROMBOCYTOPENIA.  Diagnosis Note  The bone marrow shows marked erythroid hypoplasia associated with dyspoietic changes primarily involving  megakaryocytes with predominance of hypolobated/unilobated forms. No increase in blastic cells identified.  The overall changes strongly raise the possibility of 5q- abnormality despite a somewhat limited material and  hence correlation with cytogenetic studies is strongly recommended. (BNS:eps 09/07/12)  Guerry Bruin MD  Pathologist, Electronic Signature  (Case signed 09/07/2012)     ASSESSMENT:  1. MDS with 5 q- syndrome. On Revlimid 5 mg daily, 21 days on and 7 days off. Started his next cycle on 10/09/2012.  2. Anemia, requiring PRBC transfusions for support. 3. Thrombocytopenia   PLAN:  1. I personally reviewed and went over laboratory results with the patient. 2. 2 unit PRBC transfusion, irradiated blood products.  Order placed. 3. CBC diff every Monday and Thursday 4. Continue Revlimid as directed.  5. Return as scheduled in March  All questions were answered. The patient knows to call the clinic with any problems, questions or concerns. We can certainly see the patient much sooner if necessary.  Patient and plan will be discussed  with Dr. Mariel Sleet in the near future.   KEFALAS,THOMAS

## 2012-11-17 ENCOUNTER — Encounter (HOSPITAL_BASED_OUTPATIENT_CLINIC_OR_DEPARTMENT_OTHER): Payer: Medicare Other

## 2012-11-17 VITALS — BP 129/50 | HR 58 | Temp 97.6°F | Resp 18

## 2012-11-17 DIAGNOSIS — D46C Myelodysplastic syndrome with isolated del(5q) chromosomal abnormality: Secondary | ICD-10-CM

## 2012-11-17 LAB — PREPARE RBC (CROSSMATCH)

## 2012-11-17 MED ORDER — ACETAMINOPHEN 325 MG PO TABS
ORAL_TABLET | ORAL | Status: AC
  Filled 2012-11-17: qty 2

## 2012-11-17 MED ORDER — SODIUM CHLORIDE 0.9 % IJ SOLN
10.0000 mL | INTRAMUSCULAR | Status: AC | PRN
  Administered 2012-11-17: 10 mL
  Filled 2012-11-17: qty 10

## 2012-11-17 MED ORDER — DIPHENHYDRAMINE HCL 25 MG PO CAPS
ORAL_CAPSULE | ORAL | Status: AC
  Filled 2012-11-17: qty 1

## 2012-11-17 MED ORDER — DIPHENHYDRAMINE HCL 25 MG PO CAPS
25.0000 mg | ORAL_CAPSULE | Freq: Once | ORAL | Status: AC
  Administered 2012-11-17: 25 mg via ORAL

## 2012-11-17 MED ORDER — FUROSEMIDE 10 MG/ML IJ SOLN
INTRAMUSCULAR | Status: AC
  Filled 2012-11-17: qty 2

## 2012-11-17 MED ORDER — SODIUM CHLORIDE 0.9 % IV SOLN
250.0000 mL | Freq: Once | INTRAVENOUS | Status: AC
  Administered 2012-11-17: 250 mL via INTRAVENOUS

## 2012-11-17 MED ORDER — FUROSEMIDE 10 MG/ML IJ SOLN
20.0000 mg | Freq: Once | INTRAMUSCULAR | Status: AC
  Administered 2012-11-17: 20 mg via INTRAVENOUS

## 2012-11-17 MED ORDER — ACETAMINOPHEN 325 MG PO TABS
650.0000 mg | ORAL_TABLET | Freq: Once | ORAL | Status: AC
  Administered 2012-11-17: 650 mg via ORAL

## 2012-11-18 LAB — TYPE AND SCREEN
Antibody Screen: NEGATIVE
Unit division: 0

## 2012-11-20 ENCOUNTER — Encounter (HOSPITAL_BASED_OUTPATIENT_CLINIC_OR_DEPARTMENT_OTHER): Payer: Medicare Other

## 2012-11-20 DIAGNOSIS — D46C Myelodysplastic syndrome with isolated del(5q) chromosomal abnormality: Secondary | ICD-10-CM

## 2012-11-20 LAB — CBC WITH DIFFERENTIAL/PLATELET
Basophils Relative: 17 % — ABNORMAL HIGH (ref 0–1)
Eosinophils Absolute: 0.9 10*3/uL — ABNORMAL HIGH (ref 0.0–0.7)
HCT: 26.8 % — ABNORMAL LOW (ref 39.0–52.0)
Hemoglobin: 9 g/dL — ABNORMAL LOW (ref 13.0–17.0)
Lymphocytes Relative: 29 % (ref 12–46)
MCHC: 33.6 g/dL (ref 30.0–36.0)
MCV: 85.6 fL (ref 78.0–100.0)
Monocytes Relative: 4 % (ref 3–12)
Neutro Abs: 1.3 10*3/uL — ABNORMAL LOW (ref 1.7–7.7)

## 2012-11-20 NOTE — Progress Notes (Signed)
Labs drawn today for cbc/diff 

## 2012-11-23 ENCOUNTER — Encounter (HOSPITAL_BASED_OUTPATIENT_CLINIC_OR_DEPARTMENT_OTHER): Payer: Medicare Other

## 2012-11-23 DIAGNOSIS — D46C Myelodysplastic syndrome with isolated del(5q) chromosomal abnormality: Secondary | ICD-10-CM

## 2012-11-23 LAB — CBC WITH DIFFERENTIAL/PLATELET
Eosinophils Relative: 20 % — ABNORMAL HIGH (ref 0–5)
HCT: 27 % — ABNORMAL LOW (ref 39.0–52.0)
Lymphocytes Relative: 35 % (ref 12–46)
Lymphs Abs: 1.8 10*3/uL (ref 0.7–4.0)
MCV: 84.9 fL (ref 78.0–100.0)
Monocytes Absolute: 0.1 10*3/uL (ref 0.1–1.0)
RBC: 3.18 MIL/uL — ABNORMAL LOW (ref 4.22–5.81)
WBC: 5 10*3/uL (ref 4.0–10.5)

## 2012-11-23 NOTE — Progress Notes (Signed)
Labs drawn today for cbc/diff 

## 2012-11-27 ENCOUNTER — Encounter (HOSPITAL_COMMUNITY): Payer: Medicare Other | Attending: Oncology

## 2012-11-27 ENCOUNTER — Encounter (HOSPITAL_COMMUNITY): Payer: Medicare Other

## 2012-11-27 DIAGNOSIS — D46C Myelodysplastic syndrome with isolated del(5q) chromosomal abnormality: Secondary | ICD-10-CM | POA: Insufficient documentation

## 2012-11-27 LAB — CBC WITH DIFFERENTIAL/PLATELET
Basophils Absolute: 0.4 10*3/uL — ABNORMAL HIGH (ref 0.0–0.1)
Basophils Relative: 14 % — ABNORMAL HIGH (ref 0–1)
Eosinophils Relative: 14 % — ABNORMAL HIGH (ref 0–5)
HCT: 22.1 % — ABNORMAL LOW (ref 39.0–52.0)
MCHC: 34.4 g/dL (ref 30.0–36.0)
MCV: 84.4 fL (ref 78.0–100.0)
Monocytes Absolute: 0.2 10*3/uL (ref 0.1–1.0)
RDW: 14 % (ref 11.5–15.5)

## 2012-11-27 NOTE — Addendum Note (Signed)
Addended by: Edythe Lynn A on: 11/27/2012 09:50 AM   Modules accepted: Orders, SmartSet

## 2012-11-27 NOTE — Progress Notes (Signed)
Labs drawn today for cbc/diff 

## 2012-11-28 ENCOUNTER — Encounter (HOSPITAL_BASED_OUTPATIENT_CLINIC_OR_DEPARTMENT_OTHER): Payer: Medicare Other

## 2012-11-28 ENCOUNTER — Other Ambulatory Visit (HOSPITAL_COMMUNITY): Payer: Self-pay | Admitting: Oncology

## 2012-11-28 VITALS — BP 117/45 | HR 58 | Temp 97.0°F | Resp 18

## 2012-11-28 DIAGNOSIS — D649 Anemia, unspecified: Secondary | ICD-10-CM

## 2012-11-28 DIAGNOSIS — D46C Myelodysplastic syndrome with isolated del(5q) chromosomal abnormality: Secondary | ICD-10-CM

## 2012-11-28 MED ORDER — SODIUM CHLORIDE 0.9 % IJ SOLN
10.0000 mL | INTRAMUSCULAR | Status: AC | PRN
  Administered 2012-11-28: 10 mL
  Filled 2012-11-28: qty 10

## 2012-11-28 MED ORDER — SODIUM CHLORIDE 0.9 % IV SOLN
250.0000 mL | Freq: Once | INTRAVENOUS | Status: AC
  Administered 2012-11-28: 250 mL via INTRAVENOUS

## 2012-11-28 MED ORDER — LENALIDOMIDE 5 MG PO CAPS: 5.0000 mg | ORAL_CAPSULE | Freq: Every day | ORAL | Status: DC

## 2012-11-29 LAB — TYPE AND SCREEN
ABO/RH(D): A NEG
Unit division: 0

## 2012-11-30 ENCOUNTER — Encounter (HOSPITAL_BASED_OUTPATIENT_CLINIC_OR_DEPARTMENT_OTHER): Payer: Medicare Other

## 2012-11-30 DIAGNOSIS — D46C Myelodysplastic syndrome with isolated del(5q) chromosomal abnormality: Secondary | ICD-10-CM

## 2012-11-30 LAB — CBC WITH DIFFERENTIAL/PLATELET
Basophils Absolute: 0.4 10*3/uL — ABNORMAL HIGH (ref 0.0–0.1)
Eosinophils Relative: 9 % — ABNORMAL HIGH (ref 0–5)
HCT: 29.2 % — ABNORMAL LOW (ref 39.0–52.0)
Hemoglobin: 10.1 g/dL — ABNORMAL LOW (ref 13.0–17.0)
Lymphocytes Relative: 44 % (ref 12–46)
Lymphs Abs: 1.5 10*3/uL (ref 0.7–4.0)
MCV: 83 fL (ref 78.0–100.0)
Monocytes Absolute: 0.2 10*3/uL (ref 0.1–1.0)
Monocytes Relative: 7 % (ref 3–12)
Neutro Abs: 1.1 10*3/uL — ABNORMAL LOW (ref 1.7–7.7)
RBC: 3.52 MIL/uL — ABNORMAL LOW (ref 4.22–5.81)
RDW: 14 % (ref 11.5–15.5)
WBC: 3.5 10*3/uL — ABNORMAL LOW (ref 4.0–10.5)

## 2012-11-30 NOTE — Progress Notes (Signed)
Labs drawn for cbc/diff. 

## 2012-12-04 ENCOUNTER — Encounter (HOSPITAL_BASED_OUTPATIENT_CLINIC_OR_DEPARTMENT_OTHER): Payer: Medicare Other

## 2012-12-04 DIAGNOSIS — D46C Myelodysplastic syndrome with isolated del(5q) chromosomal abnormality: Secondary | ICD-10-CM

## 2012-12-04 LAB — CBC WITH DIFFERENTIAL/PLATELET
Basophils Relative: 9 % — ABNORMAL HIGH (ref 0–1)
Eosinophils Relative: 4 % (ref 0–5)
HCT: 27.2 % — ABNORMAL LOW (ref 39.0–52.0)
Hemoglobin: 9.5 g/dL — ABNORMAL LOW (ref 13.0–17.0)
MCHC: 34.9 g/dL (ref 30.0–36.0)
MCV: 83.2 fL (ref 78.0–100.0)
Monocytes Absolute: 0.3 10*3/uL (ref 0.1–1.0)
Monocytes Relative: 6 % (ref 3–12)
Neutro Abs: 1.7 10*3/uL (ref 1.7–7.7)
RDW: 13.4 % (ref 11.5–15.5)

## 2012-12-04 NOTE — Progress Notes (Signed)
Labs drawn today for cbc/diff 

## 2012-12-06 ENCOUNTER — Other Ambulatory Visit: Payer: Medicare Other

## 2012-12-06 ENCOUNTER — Ambulatory Visit: Payer: Medicare Other | Admitting: Neurosurgery

## 2012-12-07 ENCOUNTER — Other Ambulatory Visit (HOSPITAL_COMMUNITY): Payer: Self-pay | Admitting: Oncology

## 2012-12-07 ENCOUNTER — Encounter (HOSPITAL_BASED_OUTPATIENT_CLINIC_OR_DEPARTMENT_OTHER): Payer: Medicare Other

## 2012-12-07 DIAGNOSIS — D46C Myelodysplastic syndrome with isolated del(5q) chromosomal abnormality: Secondary | ICD-10-CM

## 2012-12-07 LAB — CBC WITH DIFFERENTIAL/PLATELET
Basophils Absolute: 0.4 10*3/uL — ABNORMAL HIGH (ref 0.0–0.1)
Basophils Relative: 10 % — ABNORMAL HIGH (ref 0–1)
Eosinophils Absolute: 0.3 10*3/uL (ref 0.0–0.7)
HCT: 25.3 % — ABNORMAL LOW (ref 39.0–52.0)
Hemoglobin: 8.8 g/dL — ABNORMAL LOW (ref 13.0–17.0)
MCH: 28.9 pg (ref 26.0–34.0)
MCHC: 34.8 g/dL (ref 30.0–36.0)
Monocytes Absolute: 0.3 10*3/uL (ref 0.1–1.0)
Monocytes Relative: 8 % (ref 3–12)
Neutrophils Relative %: 34 % — ABNORMAL LOW (ref 43–77)
RDW: 13.5 % (ref 11.5–15.5)

## 2012-12-07 NOTE — Progress Notes (Signed)
Labs drawn today for cbc/diff 

## 2012-12-11 ENCOUNTER — Encounter (HOSPITAL_BASED_OUTPATIENT_CLINIC_OR_DEPARTMENT_OTHER): Payer: Medicare Other

## 2012-12-11 DIAGNOSIS — D46C Myelodysplastic syndrome with isolated del(5q) chromosomal abnormality: Secondary | ICD-10-CM

## 2012-12-11 LAB — CBC WITH DIFFERENTIAL/PLATELET
Basophils Absolute: 0.3 10*3/uL — ABNORMAL HIGH (ref 0.0–0.1)
Eosinophils Absolute: 0.3 10*3/uL (ref 0.0–0.7)
Lymphocytes Relative: 43 % (ref 12–46)
MCHC: 34.9 g/dL (ref 30.0–36.0)
Monocytes Relative: 5 % (ref 3–12)
Neutrophils Relative %: 35 % — ABNORMAL LOW (ref 43–77)
RDW: 13.5 % (ref 11.5–15.5)
WBC: 3.4 10*3/uL — ABNORMAL LOW (ref 4.0–10.5)

## 2012-12-11 LAB — PREPARE RBC (CROSSMATCH)

## 2012-12-11 NOTE — Progress Notes (Signed)
Labs drawn today for cbc/diff 

## 2012-12-11 NOTE — Addendum Note (Signed)
Addended by: Dennie Maizes on: 12/11/2012 12:56 PM   Modules accepted: Orders, SmartSet

## 2012-12-12 ENCOUNTER — Encounter (HOSPITAL_BASED_OUTPATIENT_CLINIC_OR_DEPARTMENT_OTHER): Payer: Medicare Other

## 2012-12-12 VITALS — BP 138/69 | HR 57 | Temp 97.6°F | Resp 18

## 2012-12-12 DIAGNOSIS — D46C Myelodysplastic syndrome with isolated del(5q) chromosomal abnormality: Secondary | ICD-10-CM

## 2012-12-12 MED ORDER — SODIUM CHLORIDE 0.9 % IJ SOLN
10.0000 mL | INTRAMUSCULAR | Status: DC | PRN
  Filled 2012-12-12: qty 10

## 2012-12-12 MED ORDER — SODIUM CHLORIDE 0.9 % IV SOLN: 250.0000 mL | Freq: Once | INTRAVENOUS | Status: DC

## 2012-12-12 NOTE — Progress Notes (Signed)
Tolerated transfusion well. 

## 2012-12-13 ENCOUNTER — Other Ambulatory Visit (HOSPITAL_COMMUNITY): Payer: Medicare Other

## 2012-12-13 LAB — TYPE AND SCREEN: Unit division: 0

## 2012-12-15 ENCOUNTER — Encounter (HOSPITAL_COMMUNITY): Payer: Self-pay | Admitting: Oncology

## 2012-12-15 ENCOUNTER — Encounter (HOSPITAL_BASED_OUTPATIENT_CLINIC_OR_DEPARTMENT_OTHER): Payer: Medicare Other | Admitting: Oncology

## 2012-12-15 VITALS — BP 129/64 | HR 53 | Temp 97.6°F | Resp 16 | Wt 207.3 lb

## 2012-12-15 DIAGNOSIS — D46C Myelodysplastic syndrome with isolated del(5q) chromosomal abnormality: Secondary | ICD-10-CM

## 2012-12-15 DIAGNOSIS — M549 Dorsalgia, unspecified: Secondary | ICD-10-CM

## 2012-12-15 MED ORDER — LENALIDOMIDE 10 MG PO CAPS: 10.0000 mg | ORAL_CAPSULE | Freq: Every day | ORAL | Status: DC

## 2012-12-15 NOTE — Progress Notes (Signed)
Diagnosis #1 myelodysplastic syndrome with the 5 q. minus abnormality. Thus far he feels better but I believe it is due to the fact that we are maintaining his hemoglobin greater than 8 g. The Revlimid 5 mg a day for 21 days on 7 days off with this being his third cycle has not moved his platelets or red cells higher. Therefore you're going to change and 10 mg 21 days on 7 days off and if that does not work after 2-3 cycles we will increase his dose to 10 mg. That would be a daily dose without a break.  He looks great and feels good and his vital signs are otherwise stable. His back pain is still present and he has had epidural steroid injections in the past but with his platelets low these or not safe at this time. He also has carotid artery disease and again with his platelets at this level he is not a candidate for surgery in my opinion. He needs to continue his followup appointments with his physicians but not to consider surgery at this juncture.  We will see him back here in 8 weeks by that time he will started his first cycle of 10 mg.

## 2012-12-15 NOTE — Patient Instructions (Addendum)
[facility] Discharge Instructions  RECOMMENDATIONS MADE BY THE CONSULTANT AND ANY TEST RESULTS WILL BE SENT TO YOUR REFERRING PHYSICIAN.  EXAM FINDINGS BY THE PHYSICIAN TODAY AND SIGNS OR SYMPTOMS TO REPORT TO CLINIC OR PRIMARY PHYSICIAN: Discussion by MD.  We will need to increase your dosage of Revlimid to 10 mg instead of 5 mg.  MEDICATIONS PRESCRIBED:  Revlimid 10 mg daily  INSTRUCTIONS GIVEN AND DISCUSSED: Report fevers, chills, shortness of breath, increased fatigue, etc.  SPECIAL INSTRUCTIONS/FOLLOW-UP:  Blood work to be done weekly on Mondays. Follow-up in 2 months with PA.  Thank you for choosing [facility] to provide your oncology and hematology care.  To afford each patient quality time with our providers, please arrive at least 15 minutes before your scheduled appointment time.  With your help, our goal is to use those 15 minutes to complete the necessary work-up to ensure our physicians have the information they need to help with your evaluation and healthcare recommendations.    Effective January 1st, 2014, we ask that you re-schedule your appointment with our physicians should you arrive 10 or more minutes late for your appointment.  We strive to give you quality time with our providers, and arriving late affects you and other patients whose appointments are after yours.    Again, thank you for choosing [facility].  Our hope is that these requests will decrease the amount of time that you wait before being seen by our physicians.       _____________________________________________________________  Should you have questions after your visit to [facility], please contact our office at [phone] between the hours of 8:30 a.m. and 5:00 p.m.  Voicemails left after 4:30 p.m. will not be returned until the following business day.  For prescription refill requests, have your pharmacy contact our office with your  prescription refill request.

## 2012-12-18 ENCOUNTER — Encounter (HOSPITAL_BASED_OUTPATIENT_CLINIC_OR_DEPARTMENT_OTHER): Payer: Medicare Other

## 2012-12-18 ENCOUNTER — Other Ambulatory Visit (HOSPITAL_COMMUNITY): Payer: Medicare Other

## 2012-12-18 DIAGNOSIS — D46C Myelodysplastic syndrome with isolated del(5q) chromosomal abnormality: Secondary | ICD-10-CM

## 2012-12-18 LAB — CBC WITH DIFFERENTIAL/PLATELET
Basophils Relative: 5 % — ABNORMAL HIGH (ref 0–1)
Eosinophils Absolute: 0.5 10*3/uL (ref 0.0–0.7)
Eosinophils Relative: 12 % — ABNORMAL HIGH (ref 0–5)
MCH: 28.9 pg (ref 26.0–34.0)
MCHC: 34.8 g/dL (ref 30.0–36.0)
Monocytes Relative: 9 % (ref 3–12)
Neutrophils Relative %: 38 % — ABNORMAL LOW (ref 43–77)
Platelets: 28 10*3/uL — CL (ref 150–400)

## 2012-12-18 NOTE — Progress Notes (Signed)
Labs drawn today for cbc/diff 

## 2012-12-22 ENCOUNTER — Other Ambulatory Visit (HOSPITAL_COMMUNITY): Payer: Medicare Other

## 2012-12-25 ENCOUNTER — Encounter (HOSPITAL_BASED_OUTPATIENT_CLINIC_OR_DEPARTMENT_OTHER): Payer: Medicare Other

## 2012-12-25 ENCOUNTER — Other Ambulatory Visit (HOSPITAL_COMMUNITY): Payer: Medicare Other

## 2012-12-25 DIAGNOSIS — D46C Myelodysplastic syndrome with isolated del(5q) chromosomal abnormality: Secondary | ICD-10-CM

## 2012-12-25 LAB — CBC WITH DIFFERENTIAL/PLATELET
Basophils Absolute: 0.2 10*3/uL — ABNORMAL HIGH (ref 0.0–0.1)
Basophils Relative: 4 % — ABNORMAL HIGH (ref 0–1)
Eosinophils Absolute: 0.6 10*3/uL (ref 0.0–0.7)
Lymphs Abs: 2 10*3/uL (ref 0.7–4.0)
MCH: 28.6 pg (ref 26.0–34.0)
Neutrophils Relative %: 33 % — ABNORMAL LOW (ref 43–77)
Platelets: 24 10*3/uL — CL (ref 150–400)
RBC: 2.48 MIL/uL — ABNORMAL LOW (ref 4.22–5.81)
RDW: 13.4 % (ref 11.5–15.5)

## 2012-12-25 LAB — PREPARE RBC (CROSSMATCH)

## 2012-12-25 NOTE — Progress Notes (Signed)
CRITICAL VALUE ALERT Critical value received:  Hgb 7.1, PLTs 24K Date of notification:  12/25/2012  Time of notification: 0930 Critical value read back:  yes Nurse who received alert:  Payton Doughty, RN MD notified (1st page):  T. Jacalyn Lefevre, PA-C   12/25/2012 9:48 AM Per T. Jacalyn Lefevre, PA-C, patient is to receive 2 units irradiated RBCs.  Orders placed, appt made, pt notified.

## 2012-12-25 NOTE — Addendum Note (Signed)
Addended byLeida Lauth on: 12/25/2012 09:54 AM   Modules accepted: Orders

## 2012-12-25 NOTE — Addendum Note (Signed)
Addended by: Corena Herter D on: 12/25/2012 09:48 AM   Modules accepted: Orders, SmartSet

## 2012-12-25 NOTE — Progress Notes (Signed)
Labs drawn today for cbc/diff 

## 2012-12-26 ENCOUNTER — Encounter (HOSPITAL_COMMUNITY): Payer: Medicare Other | Attending: Oncology

## 2012-12-26 VITALS — BP 132/44 | HR 56 | Temp 98.0°F | Resp 18

## 2012-12-26 DIAGNOSIS — D46C Myelodysplastic syndrome with isolated del(5q) chromosomal abnormality: Secondary | ICD-10-CM

## 2012-12-26 DIAGNOSIS — D649 Anemia, unspecified: Secondary | ICD-10-CM | POA: Insufficient documentation

## 2012-12-26 MED ORDER — SODIUM CHLORIDE 0.9 % IV SOLN
250.0000 mL | Freq: Once | INTRAVENOUS | Status: AC
  Administered 2012-12-26: 250 mL via INTRAVENOUS

## 2012-12-26 MED ORDER — SODIUM CHLORIDE 0.9 % IJ SOLN
10.0000 mL | INTRAMUSCULAR | Status: AC | PRN
  Administered 2012-12-26: 10 mL
  Filled 2012-12-26: qty 10

## 2012-12-26 NOTE — Progress Notes (Signed)
Tolerated transfusion well. 

## 2012-12-27 LAB — TYPE AND SCREEN: Unit division: 0

## 2012-12-29 ENCOUNTER — Other Ambulatory Visit (HOSPITAL_COMMUNITY): Payer: Medicare Other

## 2013-01-01 ENCOUNTER — Other Ambulatory Visit (HOSPITAL_COMMUNITY): Payer: Medicare Other

## 2013-01-01 ENCOUNTER — Encounter (HOSPITAL_BASED_OUTPATIENT_CLINIC_OR_DEPARTMENT_OTHER): Payer: Medicare Other

## 2013-01-01 DIAGNOSIS — D46C Myelodysplastic syndrome with isolated del(5q) chromosomal abnormality: Secondary | ICD-10-CM

## 2013-01-01 LAB — CBC WITH DIFFERENTIAL/PLATELET
Basophils Relative: 2 % — ABNORMAL HIGH (ref 0–1)
Hemoglobin: 8.2 g/dL — ABNORMAL LOW (ref 13.0–17.0)
MCHC: 34.5 g/dL (ref 30.0–36.0)
Monocytes Relative: 8 % (ref 3–12)
Neutro Abs: 1.2 10*3/uL — ABNORMAL LOW (ref 1.7–7.7)
Neutrophils Relative %: 39 % — ABNORMAL LOW (ref 43–77)
Platelets: 32 10*3/uL — ABNORMAL LOW (ref 150–400)
RBC: 2.86 MIL/uL — ABNORMAL LOW (ref 4.22–5.81)

## 2013-01-01 NOTE — Progress Notes (Signed)
Labs drawn today for cbc/diff 

## 2013-01-05 ENCOUNTER — Emergency Department (HOSPITAL_COMMUNITY): Payer: Medicare Other

## 2013-01-05 ENCOUNTER — Encounter (HOSPITAL_COMMUNITY): Payer: Self-pay

## 2013-01-05 ENCOUNTER — Other Ambulatory Visit (HOSPITAL_COMMUNITY): Payer: Medicare Other

## 2013-01-05 ENCOUNTER — Inpatient Hospital Stay (HOSPITAL_COMMUNITY)
Admission: EM | Admit: 2013-01-05 | Discharge: 2013-01-08 | DRG: 281 | Disposition: A | Discharge status: Home / Self Care | Payer: Medicare Other | Attending: Pulmonary Disease | Admitting: Pulmonary Disease

## 2013-01-05 DIAGNOSIS — Z7982 Long term (current) use of aspirin: Secondary | ICD-10-CM

## 2013-01-05 DIAGNOSIS — Z9849 Cataract extraction status, unspecified eye: Secondary | ICD-10-CM

## 2013-01-05 DIAGNOSIS — F411 Generalized anxiety disorder: Secondary | ICD-10-CM | POA: Diagnosis present

## 2013-01-05 DIAGNOSIS — J4489 Other specified chronic obstructive pulmonary disease: Secondary | ICD-10-CM | POA: Diagnosis present

## 2013-01-05 DIAGNOSIS — E119 Type 2 diabetes mellitus without complications: Secondary | ICD-10-CM | POA: Diagnosis present

## 2013-01-05 DIAGNOSIS — Z96659 Presence of unspecified artificial knee joint: Secondary | ICD-10-CM

## 2013-01-05 DIAGNOSIS — E785 Hyperlipidemia, unspecified: Secondary | ICD-10-CM | POA: Diagnosis present

## 2013-01-05 DIAGNOSIS — I4891 Unspecified atrial fibrillation: Secondary | ICD-10-CM | POA: Diagnosis present

## 2013-01-05 DIAGNOSIS — D61818 Other pancytopenia: Secondary | ICD-10-CM | POA: Diagnosis present

## 2013-01-05 DIAGNOSIS — Z8249 Family history of ischemic heart disease and other diseases of the circulatory system: Secondary | ICD-10-CM

## 2013-01-05 DIAGNOSIS — D46C Myelodysplastic syndrome with isolated del(5q) chromosomal abnormality: Secondary | ICD-10-CM

## 2013-01-05 DIAGNOSIS — J449 Chronic obstructive pulmonary disease, unspecified: Secondary | ICD-10-CM | POA: Diagnosis present

## 2013-01-05 DIAGNOSIS — D469 Myelodysplastic syndrome, unspecified: Secondary | ICD-10-CM | POA: Diagnosis present

## 2013-01-05 DIAGNOSIS — K219 Gastro-esophageal reflux disease without esophagitis: Secondary | ICD-10-CM | POA: Diagnosis present

## 2013-01-05 DIAGNOSIS — I6529 Occlusion and stenosis of unspecified carotid artery: Secondary | ICD-10-CM | POA: Diagnosis present

## 2013-01-05 DIAGNOSIS — D649 Anemia, unspecified: Secondary | ICD-10-CM

## 2013-01-05 DIAGNOSIS — I509 Heart failure, unspecified: Secondary | ICD-10-CM | POA: Diagnosis present

## 2013-01-05 DIAGNOSIS — I248 Other forms of acute ischemic heart disease: Secondary | ICD-10-CM

## 2013-01-05 DIAGNOSIS — I2510 Atherosclerotic heart disease of native coronary artery without angina pectoris: Secondary | ICD-10-CM | POA: Diagnosis present

## 2013-01-05 DIAGNOSIS — I359 Nonrheumatic aortic valve disorder, unspecified: Secondary | ICD-10-CM

## 2013-01-05 DIAGNOSIS — Z800 Family history of malignant neoplasm of digestive organs: Secondary | ICD-10-CM

## 2013-01-05 DIAGNOSIS — Z87891 Personal history of nicotine dependence: Secondary | ICD-10-CM

## 2013-01-05 DIAGNOSIS — I252 Old myocardial infarction: Secondary | ICD-10-CM

## 2013-01-05 DIAGNOSIS — I10 Essential (primary) hypertension: Secondary | ICD-10-CM | POA: Diagnosis present

## 2013-01-05 DIAGNOSIS — M129 Arthropathy, unspecified: Secondary | ICD-10-CM | POA: Diagnosis present

## 2013-01-05 DIAGNOSIS — I5023 Acute on chronic systolic (congestive) heart failure: Principal | ICD-10-CM | POA: Diagnosis present

## 2013-01-05 DIAGNOSIS — Z951 Presence of aortocoronary bypass graft: Secondary | ICD-10-CM

## 2013-01-05 DIAGNOSIS — Z79899 Other long term (current) drug therapy: Secondary | ICD-10-CM

## 2013-01-05 DIAGNOSIS — I214 Non-ST elevation (NSTEMI) myocardial infarction: Secondary | ICD-10-CM

## 2013-01-05 LAB — CBC WITH DIFFERENTIAL/PLATELET
Basophils Absolute: 0.1 10*3/uL (ref 0.0–0.1)
Basophils Relative: 2 % — ABNORMAL HIGH (ref 0–1)
Eosinophils Absolute: 0.2 10*3/uL (ref 0.0–0.7)
Eosinophils Relative: 6 % — ABNORMAL HIGH (ref 0–5)
Lymphs Abs: 1.2 10*3/uL (ref 0.7–4.0)
MCH: 28.7 pg (ref 26.0–34.0)
MCHC: 34.4 g/dL (ref 30.0–36.0)
MCV: 83.3 fL (ref 78.0–100.0)
Neutrophils Relative %: 39 % — ABNORMAL LOW (ref 43–77)
Platelets: 22 10*3/uL — CL (ref 150–400)
RDW: 13.9 % (ref 11.5–15.5)

## 2013-01-05 LAB — GLUCOSE, CAPILLARY

## 2013-01-05 LAB — BASIC METABOLIC PANEL
BUN: 22 mg/dL (ref 6–23)
CO2: 24 mEq/L (ref 19–32)
Calcium: 9.1 mg/dL (ref 8.4–10.5)
Creatinine, Ser: 1.43 mg/dL — ABNORMAL HIGH (ref 0.50–1.35)
GFR calc non Af Amer: 45 mL/min — ABNORMAL LOW (ref >90)
Glucose, Bld: 159 mg/dL — ABNORMAL HIGH (ref 70–99)
Sodium: 139 mEq/L (ref 135–145)

## 2013-01-05 LAB — PRO B NATRIURETIC PEPTIDE: Pro B Natriuretic peptide (BNP): 1994 pg/mL — ABNORMAL HIGH (ref 0–450)

## 2013-01-05 LAB — TROPONIN I: Troponin I: 1.69 ng/mL (ref <0.30)

## 2013-01-05 MED ORDER — ONDANSETRON HCL 4 MG PO TABS: 4.0000 mg | ORAL_TABLET | Freq: Four times a day (QID) | ORAL | Status: DC | PRN

## 2013-01-05 MED ORDER — TAMSULOSIN HCL 0.4 MG PO CAPS
0.4000 mg | ORAL_CAPSULE | Freq: Every day | ORAL | Status: DC
  Administered 2013-01-05 – 2013-01-07 (×3): 0.4 mg via ORAL
  Filled 2013-01-05 (×3): qty 1

## 2013-01-05 MED ORDER — SODIUM CHLORIDE 0.9 % IV SOLN: 500.0000 mL | Freq: Once | INTRAVENOUS | Status: DC

## 2013-01-05 MED ORDER — LENALIDOMIDE 10 MG PO CAPS: 10.0000 mg | ORAL_CAPSULE | Freq: Every day | ORAL | Status: DC

## 2013-01-05 MED ORDER — NITROGLYCERIN 0.4 MG SL SUBL: 0.4000 mg | SUBLINGUAL_TABLET | SUBLINGUAL | Status: DC | PRN

## 2013-01-05 MED ORDER — INSULIN ASPART 100 UNIT/ML ~~LOC~~ SOLN
0.0000 [IU] | Freq: Three times a day (TID) | SUBCUTANEOUS | Status: DC
  Administered 2013-01-05: 3 [IU] via SUBCUTANEOUS
  Administered 2013-01-06: 5 [IU] via SUBCUTANEOUS
  Administered 2013-01-06 – 2013-01-07 (×4): 2 [IU] via SUBCUTANEOUS
  Administered 2013-01-08: 3 [IU] via SUBCUTANEOUS

## 2013-01-05 MED ORDER — SODIUM CHLORIDE 0.9 % IJ SOLN
3.0000 mL | Freq: Two times a day (BID) | INTRAMUSCULAR | Status: DC
  Administered 2013-01-05 – 2013-01-06 (×3): 3 mL via INTRAVENOUS

## 2013-01-05 MED ORDER — ALUM & MAG HYDROXIDE-SIMETH 200-200-20 MG/5ML PO SUSP: 30.0000 mL | Freq: Four times a day (QID) | ORAL | Status: DC | PRN

## 2013-01-05 MED ORDER — SODIUM CHLORIDE 0.9 % IJ SOLN: 3.0000 mL | INTRAMUSCULAR | Status: DC | PRN

## 2013-01-05 MED ORDER — SODIUM CHLORIDE 0.9 % IJ SOLN
3.0000 mL | Freq: Two times a day (BID) | INTRAMUSCULAR | Status: DC
  Administered 2013-01-05 – 2013-01-07 (×2): 3 mL via INTRAVENOUS

## 2013-01-05 MED ORDER — METOPROLOL TARTRATE 25 MG PO TABS
25.0000 mg | ORAL_TABLET | Freq: Two times a day (BID) | ORAL | Status: DC
  Administered 2013-01-05 – 2013-01-08 (×6): 25 mg via ORAL
  Filled 2013-01-05 (×7): qty 1

## 2013-01-05 MED ORDER — AMLODIPINE BESYLATE 5 MG PO TABS
5.0000 mg | ORAL_TABLET | Freq: Every day | ORAL | Status: DC
  Administered 2013-01-05 – 2013-01-08 (×4): 5 mg via ORAL
  Filled 2013-01-05 (×4): qty 1

## 2013-01-05 MED ORDER — FUROSEMIDE 10 MG/ML IJ SOLN
40.0000 mg | Freq: Once | INTRAMUSCULAR | Status: AC
  Administered 2013-01-05: 40 mg via INTRAVENOUS
  Filled 2013-01-05: qty 4

## 2013-01-05 MED ORDER — PREGABALIN 75 MG PO CAPS
75.0000 mg | ORAL_CAPSULE | Freq: Every day | ORAL | Status: DC
  Administered 2013-01-05 – 2013-01-08 (×4): 75 mg via ORAL
  Filled 2013-01-05 (×4): qty 1

## 2013-01-05 MED ORDER — ACETAMINOPHEN 325 MG PO TABS: 650.0000 mg | ORAL_TABLET | Freq: Four times a day (QID) | ORAL | Status: DC | PRN

## 2013-01-05 MED ORDER — TRAZODONE HCL 50 MG PO TABS
25.0000 mg | ORAL_TABLET | Freq: Every evening | ORAL | Status: DC | PRN
  Administered 2013-01-05: 25 mg via ORAL
  Filled 2013-01-05: qty 1

## 2013-01-05 MED ORDER — SODIUM CHLORIDE 0.9 % IV SOLN
250.0000 mL | INTRAVENOUS | Status: DC | PRN
  Administered 2013-01-05: 250 mL via INTRAVENOUS

## 2013-01-05 MED ORDER — LENALIDOMIDE 10 MG PO CAPS
10.0000 mg | ORAL_CAPSULE | Freq: Every day | ORAL | Status: DC
  Administered 2013-01-05 – 2013-01-07 (×3): 10 mg via ORAL

## 2013-01-05 MED ORDER — ACETAMINOPHEN 650 MG RE SUPP: 650.0000 mg | Freq: Four times a day (QID) | RECTAL | Status: DC | PRN

## 2013-01-05 MED ORDER — ONDANSETRON HCL 4 MG/2ML IJ SOLN
4.0000 mg | Freq: Four times a day (QID) | INTRAMUSCULAR | Status: DC | PRN
  Administered 2013-01-07: 4 mg via INTRAVENOUS
  Filled 2013-01-05: qty 2

## 2013-01-05 MED ORDER — ASPIRIN 325 MG PO TABS
325.0000 mg | ORAL_TABLET | Freq: Once | ORAL | Status: AC
  Administered 2013-01-05: 325 mg via ORAL
  Filled 2013-01-05: qty 1

## 2013-01-05 MED ORDER — NITROGLYCERIN 2 % TD OINT
1.0000 [in_us] | TOPICAL_OINTMENT | Freq: Once | TRANSDERMAL | Status: AC
  Administered 2013-01-05: 1 [in_us] via TOPICAL
  Filled 2013-01-05: qty 1

## 2013-01-05 MED ORDER — MORPHINE SULFATE 2 MG/ML IJ SOLN: 2.0000 mg | INTRAMUSCULAR | Status: DC | PRN

## 2013-01-05 MED ORDER — AMIODARONE HCL 200 MG PO TABS
100.0000 mg | ORAL_TABLET | Freq: Every day | ORAL | Status: DC
  Administered 2013-01-05 – 2013-01-08 (×4): 100 mg via ORAL
  Filled 2013-01-05: qty 2
  Filled 2013-01-05 (×3): qty 1

## 2013-01-05 MED ORDER — DIAZEPAM 5 MG PO TABS: 5.0000 mg | ORAL_TABLET | Freq: Four times a day (QID) | ORAL | Status: DC | PRN

## 2013-01-05 MED ORDER — HYDROCODONE-ACETAMINOPHEN 5-325 MG PO TABS: 1.0000 | ORAL_TABLET | ORAL | Status: DC | PRN

## 2013-01-05 MED ORDER — ALBUTEROL SULFATE (5 MG/ML) 0.5% IN NEBU: 2.5000 mg | INHALATION_SOLUTION | RESPIRATORY_TRACT | Status: DC | PRN

## 2013-01-05 MED ORDER — FUROSEMIDE 10 MG/ML IJ SOLN
40.0000 mg | Freq: Two times a day (BID) | INTRAMUSCULAR | Status: DC
  Administered 2013-01-05 – 2013-01-08 (×6): 40 mg via INTRAVENOUS
  Filled 2013-01-05 (×6): qty 4

## 2013-01-05 NOTE — Progress Notes (Signed)
*  PRELIMINARY RESULTS* Echocardiogram 2D Echocardiogram has been performed.  [name] 01/05/2013, 3:41 PM

## 2013-01-05 NOTE — ED Provider Notes (Signed)
History     This chart was scribed for Joya Gaskins, MD, MD by Smitty Pluck, ED Scribe. The patient was seen in room APA04/APA04 and the patient's care was started at 7:59 AM.   CSN: [id number]  Arrival date & time 01/05/13  0741     Chief Complaint  Patient presents with  . Shortness of Breath    Level 5 Caveat: Severe respiratory distress   Patient is a 79 y.o. male presenting with shortness of breath. The history is provided by the patient and the spouse. The history is limited by the condition of the patient. No language interpreter was used.  Shortness of Breath  [name] is a 79 y.o. male who presents to the Emergency Department brought in by EMS due to severe, constant SOB, onset today at 3 AM.  Wife reports pt used oxygen at home and had albuterol in route without relief.  Wife reports pt takes oral medication for treatment of cancer, and usually when he needs a blood transfusion he will become SOB.  Pt reports only chest tightness.  He is unable to give further details due to his shortness of breath   Past Medical History  Diagnosis Date  . Hypertension   . COPD (chronic obstructive pulmonary disease)   . CAD (coronary artery disease)     s/p 4V CABG '96, Myoview '10 w/o ischemia  . Arthritis   . Atrial fibrillation     coumadin stopped 12/2011 2/2 anemia  . Dysphagia   . Anemia     requiring blood transfusions  . Carotid stenosis     Dr Waverly Ferrari follows.   . Diabetes mellitus, type 2     A1C 6.7% 12/2011  . HLD (hyperlipidemia)     LDL 65 12/2011, intolerant to statins  . Hematuria     felt 2/2 foley insertion  . LV dysfunction     EF 45-50% by echo 2007  . Melena     EGD & Colonoscopy 09/2011 revealed gastritis, erosions, scars, diverticula, 8 colon polyps (largest 1.6cm, not removed 2/2 anticoagulation), small AVM in transverse colon  . History of tobacco abuse     quit 1992  . Anxiety   . Shortness of breath     exertion  . GERD  (gastroesophageal reflux disease)   . Myocardial infarction   . Thrombocytopenia 09/01/2012  . Myelodysplastic syndrome with 5 q minus 10/17/2012    On Revlimid 5 mg daily 21 days on and 7 days off    Past Surgical History  Procedure Laterality Date  . Knee surgery  1960    Right knee cartilage  . Rotator cuff repair  1990    right   . Inguinal hernia repair  2000 and 2010    left  . Cholecystectomy  05/2010    Lap chole with biologic mesh repair/reinforcement by  Dr Zachery Dakins  . Back surgery  02/2006    ruptured disk,  post op diskitis infection requiring prolonged hospital stay and  surgical I & D  . Tonsillectomy    . Esophagogastroduodenoscopy  10/08/2011    Procedure: ESOPHAGOGASTRODUODENOSCOPY (EGD);  Surgeon: Malissa Hippo, MD;  Location: [facility];  Service: Endoscopy;  Laterality: N/A;  . Colonoscopy  10/08/2011    Procedure: COLONOSCOPY;  Surgeon: Malissa Hippo, MD;  Location: [facility];  Service: Endoscopy;  Laterality: N/A;  . Cataract extraction, bilateral    . Peg placement  07/2006    placed due  to prolonged infection, poor po intake, malnutrition during several week hospital stay resulting from ruptured disc that became infected following surgery  . Colonoscopy  01/14/2012    Procedure: COLONOSCOPY;  Surgeon: Malissa Hippo, MD;  Location: [facility];  Service: Endoscopy;  Laterality: N/A;  945  . Coronary artery bypass graft  1996    4 by-pass  . Ventral hernia repair    . Colonoscopy  04/20/2012    Procedure: COLONOSCOPY;  Surgeon: Malissa Hippo, MD;  Location: [facility];  Service: Endoscopy;  Laterality: N/A;  1030  . Pr vein bypass graft,aorto-fem-pop  1996  . Spine surgery    . Joint replacement  1960    Right Knee  . Lymph node biopsy  08/15/2012    Procedure: LYMPH NODE BIOPSY;  Surgeon: Marlane Hatcher, MD;  Location: [facility];  Service: General;  Laterality: Left;  Cervical Lymph Node Bx in Minor Room  . Bone marrow biopsy    . Bone  marrow aspiration      Family History  Problem Relation Age of Onset  . Heart disease Sister   . Cancer Sister   . Hyperlipidemia Sister   . Hypertension Sister   . Heart disease Brother     Heart Diseast before age 86  . Coronary artery disease Mother   . Cancer Father     stomach  . Anesthesia problems Neg Hx   . Hypotension Neg Hx   . Malignant hyperthermia Neg Hx   . Pseudochol deficiency Neg Hx     History  Substance Use Topics  . Smoking status: Former Smoker -- 2.00 packs/day for 40 years    Types: Cigarettes    Quit date: 09/27/1990  . Smokeless tobacco: Never Used  . Alcohol Use: No      Review of Systems  Unable to perform ROS: Severe respiratory distress    Allergies  Statins and Tape  Home Medications   Current Outpatient Rx  Name  Route  Sig  Dispense  Refill  . acetaminophen (TYLENOL) 500 MG tablet   Oral   Take 1 tablet (500 mg total) by mouth every 6 (six) hours as needed. Pain   30 tablet      . amiodarone (PACERONE) 200 MG tablet   Oral   Take 0.5 tablets (100 mg total) by mouth daily.   30 tablet   5   . amLODipine (NORVASC) 5 MG tablet   Oral   Take 5 mg by mouth daily.         Marland Kitchen aspirin 81 MG tablet   Oral   Take 81 mg by mouth daily.         . diazepam (VALIUM) 5 MG tablet   Oral   Take 5 mg by mouth every 6 (six) hours as needed. Muscles Spasms         . furosemide (LASIX) 40 MG tablet   Oral   Take 1 tablet (40 mg total) by mouth daily.   30 tablet   11     Take when necessary for edema   . HYDROcodone-acetaminophen (NORCO) 5-325 MG per tablet   Oral   Take 1 tablet by mouth every 6 (six) hours as needed. Pain         . lenalidomide (REVLIMID) 10 MG capsule   Oral   Take 1 capsule (10 mg total) by mouth daily.   21 capsule   3   . metFORMIN (GLUCOPHAGE) 500 MG  tablet   Oral   Take 500 mg by mouth 2 (two) times daily with a meal.         . metoprolol tartrate (LOPRESSOR) 25 MG tablet   Oral    Take 25 mg by mouth 2 (two) times daily.           . Multiple Vitamin (MULTIVITAMIN) tablet   Oral   Take 1 tablet by mouth daily.           . nitroGLYCERIN (NITROSTAT) 0.4 MG SL tablet   Sublingual   Place 1 tablet (0.4 mg total) under the tongue every 5 (five) minutes x 3 doses as needed for chest pain (up to 3 doses).   25 tablet   3   . pregabalin (LYRICA) 75 MG capsule   Oral   Take 75 mg by mouth daily.         . Red Yeast Rice 600 MG TABS   Oral   Take 1 tablet by mouth daily.           . silodosin (RAPAFLO) 8 MG CAPS capsule   Oral   Take 8 mg by mouth daily with breakfast.             BP 138/50  Pulse 75  Temp(Src) 98.1 F (36.7 C) (Oral)  Resp 30  SpO2 100% BP 123/54  Pulse 72  Temp(Src) 98.1 F (36.7 C) (Oral)  Resp 26  SpO2 99%  Physical Exam CONSTITUTIONAL: Well developed in respiratory distress HEAD: Normocephalic/atraumatic EYES: EOMI/PERRL ENMT: Mucous membranes moist NECK: supple no meningeal signs SPINE:entire spine nontender CV: S1/S2 noted LUNGS: Tachypneic, crackles bilaterally ABDOMEN: soft, nontender, no rebound or guarding GU:no cva tenderness NEURO: Pt is awake/alert, moves all extremitiesx4 EXTREMITIES: pulses normal, full ROM SKIN: warm, color normal PSYCH: Anxious   ED Course  Procedures  CRITICAL CARE Performed by: Joya Gaskins   Total critical care time: 40  Critical care time was exclusive of separately billable procedures and treating other patients.  Critical care was necessary to treat or prevent imminent or life-threatening deterioration.  Critical care was time spent personally by me on the following activities: development of treatment plan with patient and/or surrogate as well as nursing, discussions with consultants, evaluation of patient's response to treatment, examination of patient, obtaining history from patient or surrogate, ordering and performing treatments and interventions, ordering and  review of laboratory studies, ordering and review of radiographic studies, pulse oximetry and re-evaluation of patient's condition.   DIAGNOSTIC STUDIES: Oxygen Saturation is 99% on NRB, adeqyate by my interpretation.    COORDINATION OF CARE:  7:52 AM Discussed ED treatment with pt and pt agrees.  Plan to use bipap for likely acute pulmonary edema.  Will follow closely.    8:24 AM On recheck, pt states symptoms are improved. 8:58 AM Elevated troponin noted, likely multifactorial but also had EKG changes.  Will consult cardiology at [facility] 9:11 AM D/w dr Patty Sermons, cardiology, we discussed ekg/labs, feels he can stay at [facility] and be transfused Will call dr Juanetta Gosling 9:26 AM Pt appears improved I spoke to wife and patient, and discussed heart damage from anemia We discussed options and they are comfortable with staying at [facility] to dr Juanetta Gosling, he will accept to stepdown and place temp orders   Medications  nitroGLYCERIN (NITROGLYN) 2 % ointment 1 inch (1 inch Topical Given 01/05/13 0809)  aspirin tablet 325 mg (325 mg Oral Given 01/05/13 0809)  furosemide (LASIX) injection 40  mg (40 mg Intravenous Given 01/05/13 0813)      MDM  Nursing notes including past medical history and social history reviewed and considered in documentation Previous records reviewed and considered - h/o myelodisplastic syndrome per records Labs/vital reviewed and considered xrays reviewed and considered       Date: 01/05/2013  Rate: 76  Rhythm: normal sinus rhythm  QRS Axis: normal  Intervals: QT prolonged  ST/T Wave abnormalities: ST depressions laterally  Conduction Disutrbances:none  Narrative Interpretation:   Old EKG Reviewed: st depression in V5/V6 more pronounced on today's ekg    Date: 01/05/2013 0857am  Rate: 68  Rhythm: normal sinus rhythm  QRS Axis: normal  Intervals: normal  ST/T Wave abnormalities: ST depressions laterally  Conduction Disutrbances:none  Narrative  Interpretation:   Old EKG Reviewed: unchanged and from earlier ekg     I personally performed the services described in this documentation, which was scribed in my presence. The recorded information has been reviewed and is accurate.      Joya Gaskins, MD 01/05/13 [phone]

## 2013-01-05 NOTE — Progress Notes (Signed)
This is a very pleasant gentleman well known to me who has myelodysplastic syndrome with the 5 q. minus abnormality. He is presently on Revlimid which is the drug of choice. We have recently increased his dose to 10 mg 21 days on, 7 days off. We will potentially increasing to 10 mg every day if he tolerates this well.  He was admitted with CHF and what sounds like a small myocardial infarction. He remains pancytopenic. He states he is feeling better since he is in the hospital.  His pulse is nice and regular. He has no edema of his legs or arms and he is in no acute distress.  He needs to be transfused is symptomatic and typically that is under 8 g. I would transfuse platelets if they are 10,000 or less or if there is any bleeding. I would continue his Revlimid while here in the hospital.

## 2013-01-05 NOTE — ED Notes (Signed)
Pt via EMS due to shortness of breath that started at 0300 this morning. IV started in right wrist by EMS and albuterol neb treatment given.

## 2013-01-05 NOTE — ED Notes (Signed)
CRITICAL VALUE ALERT  Critical value received:  Plt. 22000 Date of notification: 01/05/2013  Time of notification:  0830 Critical value read back:yes  Nurse who received alert:  lrt  MD notified (1st page):  [place]  Time of first page:  0831 MD notified (2nd page):  Time of second page:  Responding MD: Bebe Shaggy  Time MD responded:  [phone]

## 2013-01-05 NOTE — ED Notes (Signed)
CRITICAL VALUE ALERT  Critical value received:  Troponin 1.69 Date of notification: 01/06/2011 Time of notification:  0850 Critical value read back:yes  Nurse who received alert:  lrt  MD notified (1st page):  [place]ickline  Time of first page:  0850  MD notified (2nd page):  Time of second page:  Responding MD: [id number] Time MD responded:  Bebe Shaggy

## 2013-01-05 NOTE — Progress Notes (Signed)
Decreasing o2 to 4lpm/[place] sat 99 will monitor

## 2013-01-05 NOTE — Consult Note (Signed)
CARDIOLOGY CONSULT NOTE    Patient ID: [name] MRN: [id number] DOB/AGE: 1932-09-25 79 y.o.  Admit date: 01/05/2013 Referring Physician:  Juanetta Gosling Primary Physician: [name], MD Primary Cardiologist:  Rothbart/Ross Reason for Consultation: Pulmonary Edema and elevated troponins  Active Problems:   * No active hospital problems. *   HPI:  79 yo with distant CABG in 1996.  Last myovue low risk in 2008.  EF has been in the 45% range.  Admitted with sudden onset of dyspnea and pulmonary edema. No chest pain. CXR with early revascularization, elevated BNP and ECG with diffuse inferolateral ST segment depression no elevation. Troponin 1.69 to 4.4 now.  He is improved with lasix and resting comfortably No chest pain or arrhythmia.  Medical condition complicated by severe mylodysplastic syndrome requiring multiple transfusions to keep Hb above 8  Also on revlimid.  Sees Dr Mariel Sleet for this No bleeding.  Was active and minimal dyspnea prior to this.  Patient and family clearly indicate full code status  @ROS @ All other systems reviewed and negative except as noted above  Past Medical History  Diagnosis Date  . Hypertension   . COPD (chronic obstructive pulmonary disease)   . CAD (coronary artery disease)     s/p 4V CABG '96, Myoview '10 w/o ischemia  . Arthritis   . Atrial fibrillation     coumadin stopped 12/2011 2/2 anemia  . Dysphagia   . Anemia     requiring blood transfusions  . Carotid stenosis     Dr Waverly Ferrari follows.   . Diabetes mellitus, type 2     A1C 6.7% 12/2011  . HLD (hyperlipidemia)     LDL 65 12/2011, intolerant to statins  . Hematuria     felt 2/2 foley insertion  . LV dysfunction     EF 45-50% by echo 2007  . Melena     EGD & Colonoscopy 09/2011 revealed gastritis, erosions, scars, diverticula, 8 colon polyps (largest 1.6cm, not removed 2/2 anticoagulation), small AVM in transverse colon  . History of tobacco abuse     quit 1992  .  Anxiety   . Shortness of breath     exertion  . GERD (gastroesophageal reflux disease)   . Myocardial infarction   . Thrombocytopenia 09/01/2012  . Myelodysplastic syndrome with 5 q minus 10/17/2012    On Revlimid 5 mg daily 21 days on and 7 days off    Family History  Problem Relation Age of Onset  . Heart disease Sister   . Cancer Sister   . Hyperlipidemia Sister   . Hypertension Sister   . Heart disease Brother     Heart Diseast before age 28  . Coronary artery disease Mother   . Cancer Father     stomach  . Anesthesia problems Neg Hx   . Hypotension Neg Hx   . Malignant hyperthermia Neg Hx   . Pseudochol deficiency Neg Hx     History   Social History  . Marital Status: Married    Spouse Name: N/A    Number of Children: N/A  . Years of Education: N/A   Occupational History  . Not on file.   Social History Main Topics  . Smoking status: Former Smoker -- 2.00 packs/day for 40 years    Types: Cigarettes    Quit date: 09/27/1990  . Smokeless tobacco: Never Used  . Alcohol Use: No  . Drug Use: No  . Sexually Active: Not Currently   Other Topics Concern  .  Not on file   Social History Narrative  . No narrative on file    Past Surgical History  Procedure Laterality Date  . Knee surgery  1960    Right knee cartilage  . Rotator cuff repair  1990    right   . Inguinal hernia repair  2000 and 2010    left  . Cholecystectomy  05/2010    Lap chole with biologic mesh repair/reinforcement by  Dr Zachery Dakins  . Back surgery  02/2006    ruptured disk,  post op diskitis infection requiring prolonged hospital stay and  surgical I & D  . Tonsillectomy    . Esophagogastroduodenoscopy  10/08/2011    Procedure: ESOPHAGOGASTRODUODENOSCOPY (EGD);  Surgeon: Malissa Hippo, MD;  [facility];  Service: Endoscopy;  Laterality: N/A;  . Colonoscopy  10/08/2011    Procedure: COLONOSCOPY;  Surgeon: Malissa Hippo, MD;  [facility]: [facility];  Service: Endoscopy;   Laterality: N/A;  . Cataract extraction, bilateral    . Peg placement  07/2006    placed due to prolonged infection, poor po intake, malnutrition during several week hospital stay resulting from ruptured disc that became infected following surgery  . Colonoscopy  01/14/2012    Procedure: COLONOSCOPY;  Surgeon: Malissa Hippo, MD;  [facility]: [facility];  Service: Endoscopy;  Laterality: N/A;  945  . Coronary artery bypass graft  1996    4 by-pass  . Ventral hernia repair    . Colonoscopy  04/20/2012    Procedure: COLONOSCOPY;  Surgeon: Malissa Hippo, MD;  [facility];  Service: Endoscopy;  Laterality: N/A;  1030  . Pr vein bypass graft,aorto-fem-pop  1996  . Spine surgery    . Joint replacement  1960    Right Knee  . Lymph node biopsy  08/15/2012    Procedure: LYMPH NODE BIOPSY;  Surgeon: Marlane Hatcher, MD;  Location: [facility];  Service: General;  Laterality: Left;  Cervical Lymph Node Bx in Minor Room  . Bone marrow biopsy    . Bone marrow aspiration       . amiodarone  100 mg Oral Daily  . amLODipine  5 mg Oral Daily  . furosemide  40 mg Intravenous Q12H  . insulin aspart  0-15 Units Subcutaneous TID WC  . lenalidomide  10 mg Oral Daily  . metoprolol tartrate  25 mg Oral BID  . pregabalin  75 mg Oral Daily  . sodium chloride  3 mL Intravenous Q12H  . sodium chloride  3 mL Intravenous Q12H  . tamsulosin  0.4 mg Oral QPC supper      Physical Exam: Blood pressure 109/49, pulse 62, temperature 97.7 F (36.5 C), temperature source Oral, resp. rate 20, height 5\' 11"  (1.803 m), weight 203 lb 4.2 oz (92.2 kg), SpO2 96.00%.    Affect appropriate Elderly white male HEENT: normal Neck supple with no adenopathy JVP normal no bruits no thyromegaly Lungs basilar crackles and good diaphragmatic motion Heart:  S1/S2 AV sclerosis  murmur, no rub, gallop or click PMI normal Abdomen: benighn, BS positve, no tenderness, no AAA no bruit.  No HSM or HJR Distal pulses  intact with no bruits No edema Neuro non-focal Skin warm and dry No muscular weakness   Labs:   Lab Results  Component Value Date   WBC 2.7* 01/05/2013   HGB 7.4* 01/05/2013   HCT 21.5* 01/05/2013   MCV 83.3 01/05/2013   PLT 22* 01/05/2013  Recent Labs Lab 01/05/13 0806  NA 139  K 4.0  CL 103  CO2 24  BUN 22  CREATININE 1.43*  CALCIUM 9.1  GLUCOSE 159*   Lab Results  Component Value Date   CKTOTAL 53 01/04/2012   CKMB 1.9 01/04/2012   TROPONINI 4.40* 01/05/2013    Lab Results  Component Value Date   CHOL 134 01/04/2012   CHOL 175 10/19/2010   CHOL 149 10/06/2009   Lab Results  Component Value Date   HDL 34* 01/04/2012   HDL 43 1/61/0960   HDL 36.60* 10/06/2009   Lab Results  Component Value Date   LDLCALC 65 01/04/2012   LDLCALC 112* 10/19/2010   LDLCALC 83 10/06/2009   Lab Results  Component Value Date   TRIG 174* 01/04/2012   TRIG 102 10/19/2010   TRIG 149.0 10/06/2009   Lab Results  Component Value Date   CHOLHDL 3.9 01/04/2012   CHOLHDL 4.1 Ratio 10/19/2010   CHOLHDL 4 10/06/2009   No results found for this basename: LDLDIRECT      Radiology: Dg Chest Portable 1 View  01/05/2013  *RADIOLOGY REPORT*  Clinical Data: Short of breath  PORTABLE CHEST - 1 VIEW  Comparison: Prior chest x-ray 10/25/2012  Findings: Increased pulmonary vascular congestion with mild interstitial edema.  Linear scarring versus atelectasis in the peripheral lingula similar to prior.  No significant interval change in mild cardiomegaly.  The patient is status post median sternotomy with evidence of prior multivessel CABG including LIMA to LAD bypass.  No acute osseous abnormality.  IMPRESSION:  1.  Increased pulmonary vascular congestion with mild interstitial edema suggesting developing CHF. 2.  Stable cardiomegaly   Original Report Authenticated By: Malachy Moan, M.D.     EKG:  SR with 2 mm St segment depression in inferolateral leads   ASSESSMENT AND PLAN:  SEMI:  Not a candidate for  acute cath with no ST elevation, age and comorbidities including severe mylodysplastic syndrome.  PLT;s 22 prohibits heparin or plavix Rx Will write for 81 mg of ASA if ok with Neijstrom/Oncology.  Continue Beta blocker.   CHF:  Reviewed echo EF 45-50% with mild MR Continue lasix bid.   MDS:  Should probably be transfused to Hb above 8 will need extra lasix with units.    Discussed issues with extended family and wife.  They confirm full code.    SignedCharlton Haws 01/05/2013, 4:28 PM

## 2013-01-05 NOTE — Progress Notes (Signed)
Dr. Eden Emms and Dr. Juanetta Gosling notified of troponin of 4.04. Dr. Eden Emms came to floor to see patient.

## 2013-01-05 NOTE — Evaluation (Signed)
Clinical/Bedside Swallow Evaluation  Patient Details  Name: [name] MRN: [id number] Date of Birth: May 08, 1932  Today's Date: 01/05/2013 Time: 1930-2000 SLP Time Calculation (min): 30 min  Past Medical History:  Past Medical History  Diagnosis Date  . Hypertension   . COPD (chronic obstructive pulmonary disease)   . CAD (coronary artery disease)     s/p 4V CABG '96, Myoview '10 w/o ischemia  . Arthritis   . Atrial fibrillation     coumadin stopped 12/2011 2/2 anemia  . Dysphagia   . Anemia     requiring blood transfusions  . Carotid stenosis     Dr Waverly Ferrari follows.   . Diabetes mellitus, type 2     A1C 6.7% 12/2011  . HLD (hyperlipidemia)     LDL 65 12/2011, intolerant to statins  . Hematuria     felt 2/2 foley insertion  . LV dysfunction     EF 45-50% by echo 2007  . Melena     EGD & Colonoscopy 09/2011 revealed gastritis, erosions, scars, diverticula, 8 colon polyps (largest 1.6cm, not removed 2/2 anticoagulation), small AVM in transverse colon  . History of tobacco abuse     quit 1992  . Anxiety   . Shortness of breath     exertion  . GERD (gastroesophageal reflux disease)   . Myocardial infarction   . Thrombocytopenia 09/01/2012  . Myelodysplastic syndrome with 5 q minus 10/17/2012    On Revlimid 5 mg daily 21 days on and 7 days off   Past Surgical History:  Past Surgical History  Procedure Laterality Date  . Knee surgery  1960    Right knee cartilage  . Rotator cuff repair  1990    right   . Inguinal hernia repair  2000 and 2010    left  . Cholecystectomy  05/2010    Lap chole with biologic mesh repair/reinforcement by  Dr Zachery Dakins  . Back surgery  02/2006    ruptured disk,  post op diskitis infection requiring prolonged hospital stay and  surgical I & D  . Tonsillectomy    . Esophagogastroduodenoscopy  10/08/2011    Procedure: ESOPHAGOGASTRODUODENOSCOPY (EGD);  Surgeon: Malissa Hippo, MD;  Location: [facility];  Service: Endoscopy;   Laterality: N/A;  . Colonoscopy  10/08/2011    Procedure: COLONOSCOPY;  Surgeon: Malissa Hippo, MD;  Location: [facility];  Service: Endoscopy;  Laterality: N/A;  . Cataract extraction, bilateral    . Peg placement  07/2006    placed due to prolonged infection, poor po intake, malnutrition during several week hospital stay resulting from ruptured disc that became infected following surgery  . Colonoscopy  01/14/2012    Procedure: COLONOSCOPY;  Surgeon: Malissa Hippo, MD;  Location: [facility];  Service: Endoscopy;  Laterality: N/A;  945  . Coronary artery bypass graft  1996    4 by-pass  . Ventral hernia repair    . Colonoscopy  04/20/2012    Procedure: COLONOSCOPY;  Surgeon: Malissa Hippo, MD;  Location: [facility];  Service: Endoscopy;  Laterality: N/A;  1030  . Pr vein bypass graft,aorto-fem-pop  1996  . Spine surgery    . Joint replacement  1960    Right Knee  . Lymph node biopsy  08/15/2012    Procedure: LYMPH NODE BIOPSY;  Surgeon: Marlane Hatcher, MD;  Location: [facility];  Service: General;  Laterality: Left;  Cervical Lymph Node Bx in Minor Room  . Bone marrow  biopsy    . Bone marrow aspiration     HPI:  [name] is a 79 yo male who was admitted with CHF and what sounds like a small myocardial infarction. He remains pancytopenic. He states he is feeling better since he is in the hospital.    Assessment / Plan / Recommendation Clinical Impression  Pt exhibit no overt signs/symptoms of aspiration with consistencies presented, however he complains of a 2 1/2 year history of mild difficulty swallowing. He reports that swallowing is very effortful and points to pharyngeal area. He seems to have more trouble with solids and liquids are used to wash food down. This has not impacted his overall intake, just slightly bothersome and something he says "he can live with it". It appears he had a MBSS in October 2007 when he was hospitalized for over 2 months. If pt  continues to be bothered or if symptoms worsen, SLP can complete MBSS as an outpatient. Recommend D3/mech soft and thin liquids until his dentures arrive and then could move to regular textures.    Aspiration Risk  Mild    Diet Recommendation Dysphagia 3 (Mechanical Soft);Thin liquid   Liquid Administration via: Cup;Straw Medication Administration: Whole meds with liquid Supervision: Patient able to self feed Compensations: Slow rate;Small sips/bites Postural Changes and/or Swallow Maneuvers: Seated upright 90 degrees;Upright 30-60 min after meal    Other  Recommendations Oral Care Recommendations: Oral care BID Other Recommendations: Clarify dietary restrictions   Follow Up Recommendations  None    Frequency and Duration   Pertinent Vitals/Pain    Swallow Study Prior Functional Status   Lives at home with his wife independently     General Date of Onset: 01/04/13 HPI: [name] is a 79 yo male who was admitted with CHF and what sounds like a small myocardial infarction. He remains pancytopenic. He states he is feeling better since he is in the hospital.  Type of Study: Bedside swallow evaluation Previous Swallow Assessment: MBS 06/2006 unsure of recommendations/results Diet Prior to this Study: Thin liquids Temperature Spikes Noted: No Respiratory Status: Supplemental O2 delivered via (comment) History of Recent Intubation: No Behavior/Cognition: Alert;Cooperative;Pleasant mood Oral Cavity - Dentition: Dentures, not available Self-Feeding Abilities: Able to feed self Patient Positioning: Upright in bed Baseline Vocal Quality: Clear Volitional Cough: Strong Volitional Swallow: Able to elicit    Oral/Motor/Sensory Function Overall Oral Motor/Sensory Function: Appears within functional limits for tasks assessed   Ice Chips Ice chips: Within functional limits Presentation: Spoon   Thin Liquid Thin Liquid: Within functional limits Presentation: Cup;Straw;Self Fed     Nectar Thick Nectar Thick Liquid: Not tested   Honey Thick Honey Thick Liquid: Not tested   Puree Puree: Within functional limits Presentation: Spoon   Solid   Thank you,  Havery Moros, CCC-SLP [phone]      Solid: Within functional limits (with allowance for edentulous status, has at home) Presentation: Self Fed       Yarel Kilcrease 01/05/2013,8:59 PM

## 2013-01-06 DIAGNOSIS — I214 Non-ST elevation (NSTEMI) myocardial infarction: Secondary | ICD-10-CM | POA: Diagnosis present

## 2013-01-06 LAB — GLUCOSE, CAPILLARY
Glucose-Capillary: 112 mg/dL — ABNORMAL HIGH (ref 70–99)
Glucose-Capillary: 143 mg/dL — ABNORMAL HIGH (ref 70–99)
Glucose-Capillary: 148 mg/dL — ABNORMAL HIGH (ref 70–99)
Glucose-Capillary: 230 mg/dL — ABNORMAL HIGH (ref 70–99)

## 2013-01-06 LAB — CBC
HCT: 23.7 % — ABNORMAL LOW (ref 39.0–52.0)
Hemoglobin: 8.3 g/dL — ABNORMAL LOW (ref 13.0–17.0)
MCHC: 35 g/dL (ref 30.0–36.0)
MCV: 81.7 fL (ref 78.0–100.0)
WBC: 2.3 10*3/uL — ABNORMAL LOW (ref 4.0–10.5)

## 2013-01-06 LAB — BASIC METABOLIC PANEL
BUN: 22 mg/dL (ref 6–23)
CO2: 27 mEq/L (ref 19–32)
Chloride: 100 mEq/L (ref 96–112)
Creatinine, Ser: 1.48 mg/dL — ABNORMAL HIGH (ref 0.50–1.35)
GFR calc Af Amer: 50 mL/min — ABNORMAL LOW (ref >90)
Potassium: 3.7 mEq/L (ref 3.5–5.1)

## 2013-01-06 LAB — HEMOGLOBIN A1C: Hgb A1c MFr Bld: 7.5 % — ABNORMAL HIGH (ref <5.7)

## 2013-01-06 NOTE — H&P (Signed)
[name], [name]             ACCOUNT NO.:  [id number]  MEDICAL RECORD NO.:  [id number]  LOCATION:  [facility]                         FACILITY:  [facility]  PHYSICIAN:  Meeah Totino L. Juanetta Gosling, M.D.DATE OF BIRTH:  [birth year]  DATE OF ADMISSION:  01/05/2013 DATE OF DISCHARGE:  LH                             HISTORY & PHYSICAL   REASON FOR ADMISSION:  Congestive heart failure.  HISTORY:  This is a 79 year old Caucasian male who has a history of cardiac disease with coronary artery bypass grafting done almost 20 years ago.  He came to the emergency room because of increased shortness of breath, chest discomfort, and what appeared to be pulmonary edema. Chest x-ray showed what appeared to be pulmonary edema, elevated BNP. EKG showed inferolateral ST-segment depression.  He says that he was extremely active on the day prior to admission.  He did not have any chest discomfort while he was active.  He went to bed feeling well and woke up suddenly short of breath.  He has had several similar admissions and that is often been related to low hemoglobin level.  PAST MEDICAL HISTORY:  Positive for, 1. Hypertension. 2. COPD. 3. Coronary artery occlusive disease. 4. Atrial fibrillation. 5. Anemia due to myelodysplastic disorder. 6. Carotid arterial disease. 7. Diabetes. 8. Hyperlipidemia. 9. Some left ventricular dysfunction. 10.GERD.  FAMILY HISTORY:  His sister has cancer and heart disease.  There is a positive family history of multiple other family members with heart disease.  His father had cancer of the stomach.  SOCIAL HISTORY:  He is married.  Lives at home with his wife.  He has about an 80-pack year smoking history, but stopped almost 20 years ago. He does not use any alcohol.  He does not use any illicit drugs.  PAST SURGICAL HISTORY:  He has had knee surgery in the remote past, rotator cuff surgery in 1990, inguinal hernia repair in 2000 and 2010, cholecystectomy done in 2011, back  surgery in 2007, and he had postop infection that required prolonged antibiotic, tonsillectomy, and he has had EGD and colonoscopy.  He had coronary bypass grafting with 4 vessels in 1996, aorto fem-pop bypass in 1996, right knee and lymph node biopsy.  MEDICATIONS: 1. Amiodarone 100 mg daily. 2. Amlodipine 5 mg daily. 3. Lasix 40 mg as needed. 4. Metformin 500 mg b.i.d. 5. Metoprolol 25 mg b.i.d. 6. Lyrica 75 mg daily. 7. Tamsulosin, he actually takes I think Rapaflo at home 8 mg daily.  PHYSICAL EXAMINATION:  GENERAL:  A well-developed, well-nourished male who appears to be in no acute distress, but looks chronically ill. HEENT:  His pupils are reactive.  Nose and throat are clear.  Mucous membranes are normal. CHEST:  Bilateral basilar rales. HEART:  3/6 systolic murmur. ABDOMEN:  Soft without masses. EXTREMITIES:  No edema. NEUROLOGICAL:  Grossly intact.  LABORATORY WORK:  White count was 2700, hemoglobin 7.4, platelets 22,000.  BUN 22, creatinine 1.43.  Troponin was somewhat elevated in the emergency room.  Chest x-ray consistent with congestive heart failure.  PLAN:  My plan then is for him to have blood transfusion.  I want to try to get him to approximately 10.  He will have  Cardiology consultation, echocardiogram, and continue his other treatments.     Yuval Nolet L. Juanetta Gosling, M.D.     ELH/MEDQ  D:  01/05/2013  T:  01/06/2013  Job:  [id number]

## 2013-01-06 NOTE — Progress Notes (Signed)
Report called and given to T. Corine Shelter, RN. Patient being transferred to dept 300, room 328. Patient alert, oriented and in stable condition at the time of transport. Patient being transported on oxygen and tele in wheelchair. Patients wife at bedside for transfer.

## 2013-01-06 NOTE — Progress Notes (Signed)
Subjective: He is awake and alert and says he feels better. They help from consultants is noted and appreciated. His troponin level has come down but is still elevated. He is not as short of breath. He now gives further history that he has gained about 7 pounds over the last 2 weeks and that he has been having more shortness of breath. I explained to him that weight gain generally means that he has gained fluid. He did not understand what to do about taking Lasix and I think he has a better understanding now.  Objective: Vital signs in last 24 hours: Temp:  [97.4 F (36.3 C)-99.7 F (37.6 C)] 99.3 F (37.4 C) (04/12 0758) Pulse Rate:  [54-86] 86 (04/12 0600) Resp:  [11-26] 19 (04/12 0600) BP: (109-131)/(44-74) 115/55 mmHg (04/12 0600) SpO2:  [89 %-100 %] 94 % (04/12 0600) Weight:  [92.2 kg (203 lb 4.2 oz)-92.3 kg (203 lb 7.8 oz)] 92.3 kg (203 lb 7.8 oz) (04/12 0400) Weight change:  Last BM Date: 01/03/13  Intake/Output from previous day: 04/11 0701 - 04/12 0700 In: 1901.7 [P.O.:1200; Blood:701.7] Out: 3225 [Urine:3225]  PHYSICAL EXAM General appearance: alert, cooperative and mild distress Resp: rales bibasilar Cardio: regular rate and rhythm, S1, S2 normal, no murmur, click, rub or gallop GI: soft, non-tender; bowel sounds normal; no masses,  no organomegaly Extremities: extremities normal, atraumatic, no cyanosis or edema  Lab Results:    Basic Metabolic Panel:  Recent Labs  16/10/96 0806 01/06/13 0518  NA 139 139  K 4.0 3.7  CL 103 100  CO2 24 27  GLUCOSE 159* 142*  BUN 22 22  CREATININE 1.43* 1.48*  CALCIUM 9.1 8.8   Liver Function Tests: No results found for this basename: AST, ALT, ALKPHOS, BILITOT, PROT, ALBUMIN,  in the last 72 hours No results found for this basename: LIPASE, AMYLASE,  in the last 72 hours No results found for this basename: AMMONIA,  in the last 72 hours CBC:  Recent Labs  01/05/13 0806 01/06/13 0518  WBC 2.7* 2.3*  NEUTROABS 1.0*   --   HGB 7.4* 8.3*  HCT 21.5* 23.7*  MCV 83.3 81.7  PLT 22* 19*   Cardiac Enzymes:  Recent Labs  01/05/13 0806 01/05/13 1400 01/06/13 0135  TROPONINI 1.69* 4.40* 1.55*   BNP:  Recent Labs  01/05/13 0806  PROBNP 1994.0*   D-Dimer: No results found for this basename: DDIMER,  in the last 72 hours CBG:  Recent Labs  01/05/13 1149 01/05/13 1635 01/05/13 2100 01/06/13 0716  GLUCAP 131* 176* 99 119*   Hemoglobin A1C:  Recent Labs  01/05/13 1400  HGBA1C 7.5*   Fasting Lipid Panel: No results found for this basename: CHOL, HDL, LDLCALC, TRIG, CHOLHDL, LDLDIRECT,  in the last 72 hours Thyroid Function Tests: No results found for this basename: TSH, T4TOTAL, FREET4, T3FREE, THYROIDAB,  in the last 72 hours Anemia Panel: No results found for this basename: VITAMINB12, FOLATE, FERRITIN, TIBC, IRON, RETICCTPCT,  in the last 72 hours Coagulation: No results found for this basename: LABPROT, INR,  in the last 72 hours Urine Drug Screen: Drugs of Abuse  No results found for this basename: labopia, cocainscrnur, labbenz, amphetmu, thcu, labbarb    Alcohol Level: No results found for this basename: ETH,  in the last 72 hours Urinalysis: No results found for this basename: COLORURINE, APPERANCEUR, LABSPEC, PHURINE, GLUCOSEU, HGBUR, BILIRUBINUR, KETONESUR, PROTEINUR, UROBILINOGEN, NITRITE, LEUKOCYTESUR,  in the last 72 hours Misc. Labs:  ABGS No results found for  this basename: PHART, PCO2, PO2ART, TCO2, HCO3,  in the last 72 hours CULTURES Recent Results (from the past 240 hour(s))  MRSA PCR SCREENING     Status: Abnormal   Collection Time    01/05/13 10:52 AM      Result Value Range Status   MRSA by PCR INVALID RESULTS, SPECIMEN SENT FOR CULTURE (*) NEGATIVE Final   Comment: LEE,B ON 01/05/13 AT 2020 BY LOY,C                The GeneXpert MRSA Assay (FDA     approved for NASAL specimens     only), is one component of a     comprehensive MRSA colonization      surveillance program. It is not     intended to diagnose MRSA     infection nor to guide or     monitor treatment for     MRSA infections.   Studies/Results: Dg Chest Portable 1 View  01/05/2013  *RADIOLOGY REPORT*  Clinical Data: Short of breath  PORTABLE CHEST - 1 VIEW  Comparison: Prior chest x-ray 10/25/2012  Findings: Increased pulmonary vascular congestion with mild interstitial edema.  Linear scarring versus atelectasis in the peripheral lingula similar to prior.  No significant interval change in mild cardiomegaly.  The patient is status post median sternotomy with evidence of prior multivessel CABG including LIMA to LAD bypass.  No acute osseous abnormality.  IMPRESSION:  1.  Increased pulmonary vascular congestion with mild interstitial edema suggesting developing CHF. 2.  Stable cardiomegaly   Original Report Authenticated By: Malachy Moan, M.D.     Medications:  Prior to Admission:  Prescriptions prior to admission  Medication Sig Dispense Refill  . acetaminophen (TYLENOL) 500 MG tablet Take 1 tablet (500 mg total) by mouth every 6 (six) hours as needed. Pain  30 tablet    . amiodarone (PACERONE) 200 MG tablet Take 0.5 tablets (100 mg total) by mouth daily.  30 tablet  5  . amLODipine (NORVASC) 5 MG tablet Take 5 mg by mouth daily.      Marland Kitchen aspirin EC 81 MG tablet Take 81 mg by mouth daily.      . diazepam (VALIUM) 5 MG tablet Take 5 mg by mouth every 6 (six) hours as needed. Muscles Spasms      . furosemide (LASIX) 40 MG tablet Take 1 tablet (40 mg total) by mouth daily.  30 tablet  11  . lenalidomide (REVLIMID) 10 MG capsule Take 1 capsule (10 mg total) by mouth daily.  21 capsule  3  . metFORMIN (GLUCOPHAGE) 500 MG tablet Take 500 mg by mouth 2 (two) times daily with a meal.      . metoprolol tartrate (LOPRESSOR) 25 MG tablet Take 25 mg by mouth 2 (two) times daily.        . Multiple Vitamin (MULTIVITAMIN) tablet Take 1 tablet by mouth daily.        . nitroGLYCERIN  (NITROSTAT) 0.4 MG SL tablet Place 1 tablet (0.4 mg total) under the tongue every 5 (five) minutes x 3 doses as needed for chest pain (up to 3 doses).  25 tablet  3  . pregabalin (LYRICA) 75 MG capsule Take 75 mg by mouth daily.      . Red Yeast Rice 600 MG TABS Take 1 tablet by mouth daily.        . silodosin (RAPAFLO) 8 MG CAPS capsule Take 8 mg by mouth daily with breakfast.        .  HYDROcodone-acetaminophen (NORCO) 5-325 MG per tablet Take 1 tablet by mouth every 6 (six) hours as needed. Pain       Scheduled: . sodium chloride  500 mL Intravenous Once  . amiodarone  100 mg Oral Daily  . amLODipine  5 mg Oral Daily  . furosemide  40 mg Intravenous Q12H  . insulin aspart  0-15 Units Subcutaneous TID WC  . lenalidomide  10 mg Oral Daily  . metoprolol tartrate  25 mg Oral BID  . pregabalin  75 mg Oral Daily  . sodium chloride  3 mL Intravenous Q12H  . sodium chloride  3 mL Intravenous Q12H  . tamsulosin  0.4 mg Oral QPC supper   Continuous:  WUJ:WJXBJY chloride, acetaminophen, acetaminophen, albuterol, alum & mag hydroxide-simeth, diazepam, HYDROcodone-acetaminophen, morphine injection, nitroGLYCERIN, ondansetron (ZOFRAN) IV, ondansetron, sodium chloride, traZODone  Assesment: He was admitted with congestive heart failure. I think this is multi-factorial and includes anemia his known cardiac disease and he probably had been having increasing symptoms for about 2 weeks that he really did not recognize or report. He had a non-STEMI. His hemoglobin level is better but he still has a very low platelet count. I would like to see his hemoglobin level closer to 10. Active Problems:   * No active hospital problems. *    Plan: Continue treatments he will have another unit of blood I think he can transfer out of the intensive care unit    LOS: 1 day   Ashyla Luth L 01/06/2013, 9:01 AM

## 2013-01-06 NOTE — Progress Notes (Signed)
E-link RN & MD updated on PT's hgb=8.3  Hct=23.7 after 2 PRBC's given for 4/11 0847 hgb=7.4.

## 2013-01-06 NOTE — Progress Notes (Signed)
Dr Deterding updated pt's platelets=19

## 2013-01-07 LAB — GLUCOSE, CAPILLARY
Glucose-Capillary: 136 mg/dL — ABNORMAL HIGH (ref 70–99)
Glucose-Capillary: 152 mg/dL — ABNORMAL HIGH (ref 70–99)
Glucose-Capillary: 146 mg/dL — ABNORMAL HIGH (ref 70–99)

## 2013-01-07 LAB — CBC WITH DIFFERENTIAL/PLATELET
Eosinophils Absolute: 0.2 10*3/uL (ref 0.0–0.7)
Hemoglobin: 9.5 g/dL — ABNORMAL LOW (ref 13.0–17.0)
Lymphocytes Relative: 29 % (ref 12–46)
Lymphs Abs: 0.9 10*3/uL (ref 0.7–4.0)
MCH: 28 pg (ref 26.0–34.0)
Monocytes Relative: 9 % (ref 3–12)
Neutro Abs: 1.6 10*3/uL — ABNORMAL LOW (ref 1.7–7.7)
Neutrophils Relative %: 54 % (ref 43–77)
Platelets: 21 10*3/uL — CL (ref 150–400)
RBC: 3.39 MIL/uL — ABNORMAL LOW (ref 4.22–5.81)
WBC: 3 10*3/uL — ABNORMAL LOW (ref 4.0–10.5)

## 2013-01-07 LAB — TYPE AND SCREEN
Unit division: 0
Unit division: 0

## 2013-01-07 LAB — BASIC METABOLIC PANEL
Calcium: 8.9 mg/dL (ref 8.4–10.5)
GFR calc Af Amer: 40 mL/min — ABNORMAL LOW (ref >90)
GFR calc non Af Amer: 35 mL/min — ABNORMAL LOW (ref >90)
Glucose, Bld: 148 mg/dL — ABNORMAL HIGH (ref 70–99)
Potassium: 3.2 mEq/L — ABNORMAL LOW (ref 3.5–5.1)
Sodium: 137 mEq/L (ref 135–145)

## 2013-01-07 MED ORDER — CEFUROXIME AXETIL 250 MG PO TABS
500.0000 mg | ORAL_TABLET | Freq: Two times a day (BID) | ORAL | Status: DC
  Administered 2013-01-07 – 2013-01-08 (×3): 500 mg via ORAL
  Filled 2013-01-07 (×3): qty 2

## 2013-01-07 MED ORDER — GUAIFENESIN ER 600 MG PO TB12
1200.0000 mg | ORAL_TABLET | Freq: Two times a day (BID) | ORAL | Status: DC
  Administered 2013-01-07 – 2013-01-08 (×3): 1200 mg via ORAL
  Filled 2013-01-07 (×3): qty 2

## 2013-01-07 MED ORDER — POTASSIUM CHLORIDE CRYS ER 20 MEQ PO TBCR
20.0000 meq | EXTENDED_RELEASE_TABLET | Freq: Two times a day (BID) | ORAL | Status: DC
  Administered 2013-01-07 – 2013-01-08 (×3): 20 meq via ORAL
  Filled 2013-01-07 (×3): qty 1

## 2013-01-07 NOTE — Progress Notes (Signed)
Subjective: He feels better. He is not short of breath. He has been up and walking a little bit  Objective: Vital signs in last 24 hours: Temp:  [98.1 F (36.7 C)-98.6 F (37 C)] 98.2 F (36.8 C) (04/13 1419) Pulse Rate:  [66-73] 66 (04/13 1419) Resp:  [20] 20 (04/13 1419) BP: (116-145)/(49-57) 121/54 mmHg (04/13 1419) SpO2:  [90 %-93 %] 90 % (04/13 1419) Weight:  [100.1 kg (220 lb 10.9 oz)] 100.1 kg (220 lb 10.9 oz) (04/13 0500) Weight change: 7.9 kg (17 lb 6.7 oz) Last BM Date: 01/06/13  Intake/Output from previous day: 04/12 0701 - 04/13 0700 In: 590 [P.O.:240; Blood:350] Out: 700 [Urine:700]  PHYSICAL EXAM General appearance: alert, cooperative and mild distress Resp: clear to auscultation bilaterally Cardio: irregularly irregular rhythm GI: soft, non-tender; bowel sounds normal; no masses,  no organomegaly Extremities: extremities normal, atraumatic, no cyanosis or edema  Lab Results:    Basic Metabolic Panel:  Recent Labs  86/57/84 0518 01/07/13 0630  NA 139 137  K 3.7 3.2*  CL 100 96  CO2 27 30  GLUCOSE 142* 148*  BUN 22 27*  CREATININE 1.48* 1.79*  CALCIUM 8.8 8.9   Liver Function Tests: No results found for this basename: AST, ALT, ALKPHOS, BILITOT, PROT, ALBUMIN,  in the last 72 hours No results found for this basename: LIPASE, AMYLASE,  in the last 72 hours No results found for this basename: AMMONIA,  in the last 72 hours CBC:  Recent Labs  01/05/13 0806 01/06/13 0518 01/07/13 0630  WBC 2.7* 2.3* 3.0*  NEUTROABS 1.0*  --  1.6*  HGB 7.4* 8.3* 9.5*  HCT 21.5* 23.7* 27.7*  MCV 83.3 81.7 81.7  PLT 22* 19* 21*   Cardiac Enzymes:  Recent Labs  01/05/13 0806 01/05/13 1400 01/06/13 0135  TROPONINI 1.69* 4.40* 1.55*   BNP:  Recent Labs  01/05/13 0806  PROBNP 1994.0*   D-Dimer: No results found for this basename: DDIMER,  in the last 72 hours CBG:  Recent Labs  01/06/13 1613 01/06/13 2124 01/06/13 2334 01/07/13 0749  01/07/13 1125 01/07/13 1640  GLUCAP 230* 148* 112* 136* 152* 146*   Hemoglobin A1C:  Recent Labs  01/05/13 1400  HGBA1C 7.5*   Fasting Lipid Panel: No results found for this basename: CHOL, HDL, LDLCALC, TRIG, CHOLHDL, LDLDIRECT,  in the last 72 hours Thyroid Function Tests: No results found for this basename: TSH, T4TOTAL, FREET4, T3FREE, THYROIDAB,  in the last 72 hours Anemia Panel: No results found for this basename: VITAMINB12, FOLATE, FERRITIN, TIBC, IRON, RETICCTPCT,  in the last 72 hours Coagulation: No results found for this basename: LABPROT, INR,  in the last 72 hours Urine Drug Screen: Drugs of Abuse  No results found for this basename: labopia, cocainscrnur, labbenz, amphetmu, thcu, labbarb    Alcohol Level: No results found for this basename: ETH,  in the last 72 hours Urinalysis: No results found for this basename: COLORURINE, APPERANCEUR, LABSPEC, PHURINE, GLUCOSEU, HGBUR, BILIRUBINUR, KETONESUR, PROTEINUR, UROBILINOGEN, NITRITE, LEUKOCYTESUR,  in the last 72 hours Misc. Labs:  ABGS No results found for this basename: PHART, PCO2, PO2ART, TCO2, HCO3,  in the last 72 hours CULTURES Recent Results (from the past 240 hour(s))  MRSA PCR SCREENING     Status: Abnormal   Collection Time    01/05/13 10:52 AM      Result Value Range Status   MRSA by PCR INVALID RESULTS, SPECIMEN SENT FOR CULTURE (*) NEGATIVE Final   Comment: LEE,B ON 01/05/13 AT  2020 BY LOY,C                The GeneXpert MRSA Assay (FDA     approved for NASAL specimens     only), is one component of a     comprehensive MRSA colonization     surveillance program. It is not     intended to diagnose MRSA     infection nor to guide or     monitor treatment for     MRSA infections.  MRSA CULTURE     Status: None   Collection Time    01/05/13 10:52 AM      Result Value Range Status   Specimen Description NOSE   Final   Special Requests NONE   Final   Culture NO SUSPICIOUS COLONIES, CONTINUING  TO HOLD   Final   Report Status PENDING   Incomplete   Studies/Results: No results found.  Medications:  Prior to Admission:  Prescriptions prior to admission  Medication Sig Dispense Refill  . acetaminophen (TYLENOL) 500 MG tablet Take 1 tablet (500 mg total) by mouth every 6 (six) hours as needed. Pain  30 tablet    . amiodarone (PACERONE) 200 MG tablet Take 0.5 tablets (100 mg total) by mouth daily.  30 tablet  5  . amLODipine (NORVASC) 5 MG tablet Take 5 mg by mouth daily.      Marland Kitchen aspirin EC 81 MG tablet Take 81 mg by mouth daily.      . diazepam (VALIUM) 5 MG tablet Take 5 mg by mouth every 6 (six) hours as needed. Muscles Spasms      . furosemide (LASIX) 40 MG tablet Take 1 tablet (40 mg total) by mouth daily.  30 tablet  11  . lenalidomide (REVLIMID) 10 MG capsule Take 1 capsule (10 mg total) by mouth daily.  21 capsule  3  . metFORMIN (GLUCOPHAGE) 500 MG tablet Take 500 mg by mouth 2 (two) times daily with a meal.      . metoprolol tartrate (LOPRESSOR) 25 MG tablet Take 25 mg by mouth 2 (two) times daily.        . Multiple Vitamin (MULTIVITAMIN) tablet Take 1 tablet by mouth daily.        . nitroGLYCERIN (NITROSTAT) 0.4 MG SL tablet Place 1 tablet (0.4 mg total) under the tongue every 5 (five) minutes x 3 doses as needed for chest pain (up to 3 doses).  25 tablet  3  . pregabalin (LYRICA) 75 MG capsule Take 75 mg by mouth daily.      . Red Yeast Rice 600 MG TABS Take 1 tablet by mouth daily.        . silodosin (RAPAFLO) 8 MG CAPS capsule Take 8 mg by mouth daily with breakfast.        . HYDROcodone-acetaminophen (NORCO) 5-325 MG per tablet Take 1 tablet by mouth every 6 (six) hours as needed. Pain       Scheduled: . amiodarone  100 mg Oral Daily  . amLODipine  5 mg Oral Daily  . cefUROXime  500 mg Oral BID WC  . furosemide  40 mg Intravenous Q12H  . guaiFENesin  1,200 mg Oral BID  . insulin aspart  0-15 Units Subcutaneous TID WC  . lenalidomide  10 mg Oral Daily  . metoprolol  tartrate  25 mg Oral BID  . potassium chloride  20 mEq Oral BID  . pregabalin  75 mg Oral Daily  . sodium chloride  3 mL Intravenous Q12H  . sodium chloride  3 mL Intravenous Q12H  . tamsulosin  0.4 mg Oral QPC supper   Continuous:  AVW:UJWJXB chloride, acetaminophen, acetaminophen, albuterol, alum & mag hydroxide-simeth, diazepam, HYDROcodone-acetaminophen, morphine injection, nitroGLYCERIN, ondansetron (ZOFRAN) IV, ondansetron, sodium chloride, traZODone  Assesment he has coronary disease. He had a non-ST elevation myocardial infarction. Congestive heart failu He has myelodysplastic disorder.he is improving. Active Problems:   CAD   Atrial fibrillation   Anemia   Myelodysplastic syndrome with 5 q minus   Non-ST elevation MI (NSTEMI)    Plan:continue current treatment. He may be able to be discharged tomorrow    LOS: 2 days   [name] L 01/07/2013, 7:06 PM

## 2013-01-08 ENCOUNTER — Encounter (HOSPITAL_COMMUNITY): Payer: Medicare Other

## 2013-01-08 ENCOUNTER — Other Ambulatory Visit (HOSPITAL_COMMUNITY): Payer: Medicare Other

## 2013-01-08 DIAGNOSIS — D61818 Other pancytopenia: Secondary | ICD-10-CM | POA: Diagnosis present

## 2013-01-08 DIAGNOSIS — I5023 Acute on chronic systolic (congestive) heart failure: Principal | ICD-10-CM | POA: Diagnosis present

## 2013-01-08 LAB — CBC WITH DIFFERENTIAL/PLATELET
Basophils Absolute: 0.1 10*3/uL (ref 0.0–0.1)
Basophils Relative: 2 % — ABNORMAL HIGH (ref 0–1)
Eosinophils Absolute: 0.2 10*3/uL (ref 0.0–0.7)
Hemoglobin: 8.8 g/dL — ABNORMAL LOW (ref 13.0–17.0)
MCH: 27.8 pg (ref 26.0–34.0)
MCHC: 33.7 g/dL (ref 30.0–36.0)
Monocytes Absolute: 0.2 10*3/uL (ref 0.1–1.0)
Monocytes Relative: 8 % (ref 3–12)
Neutrophils Relative %: 60 % (ref 43–77)
RDW: 14.9 % (ref 11.5–15.5)

## 2013-01-08 LAB — BASIC METABOLIC PANEL
BUN: 32 mg/dL — ABNORMAL HIGH (ref 6–23)
Creatinine, Ser: 1.82 mg/dL — ABNORMAL HIGH (ref 0.50–1.35)
GFR calc Af Amer: 39 mL/min — ABNORMAL LOW (ref >90)
GFR calc non Af Amer: 34 mL/min — ABNORMAL LOW (ref >90)
Glucose, Bld: 141 mg/dL — ABNORMAL HIGH (ref 70–99)
Potassium: 3.5 mEq/L (ref 3.5–5.1)

## 2013-01-08 LAB — GLUCOSE, CAPILLARY

## 2013-01-08 MED ORDER — LIVING BETTER WITH HEART FAILURE BOOK
Freq: Once | Status: AC
  Administered 2013-01-08: 1
  Filled 2013-01-08: qty 1

## 2013-01-08 MED ORDER — NITROGLYCERIN 0.4 MG SL SUBL: 0.4000 mg | SUBLINGUAL_TABLET | SUBLINGUAL | Status: AC | PRN

## 2013-01-08 MED ORDER — FUROSEMIDE 40 MG PO TABS: 40.0000 mg | ORAL_TABLET | Freq: Every day | ORAL | Status: DC

## 2013-01-08 MED ORDER — ALBUTEROL SULFATE HFA 108 (90 BASE) MCG/ACT IN AERS: 2.0000 | INHALATION_SPRAY | Freq: Four times a day (QID) | RESPIRATORY_TRACT | Status: AC | PRN

## 2013-01-08 MED ORDER — CEFUROXIME AXETIL 500 MG PO TABS: 500.0000 mg | ORAL_TABLET | Freq: Two times a day (BID) | ORAL | Status: DC

## 2013-01-08 NOTE — Progress Notes (Signed)
Pt discharged with instructions , care note, and prescriptions.  Pt verbalized understanding.  Pt left the floor via w/c via ambulation in stable condition. Pt was given his Chemo drug.  It was 18 capsules.

## 2013-01-08 NOTE — Progress Notes (Signed)
Subjective: He has been complaining of an upper respiratory infection. He was able to ambulate in the halls. We discussed again the fact that he was probably developing trouble with CHF several days prior to the acute event and that this may be preventable. I don't think he understood that the edema and weight gain memantine he was gathering fluid  Objective: Vital signs in last 24 hours: Temp:  [97.2 F (36.2 C)-98.2 F (36.8 C)] 97.2 F (36.2 C) (04/14 0505) Pulse Rate:  [63-66] 63 (04/14 0505) Resp:  [20] 20 (04/14 0505) BP: (119-126)/(46-60) 119/60 mmHg (04/14 0505) SpO2:  [90 %-95 %] 90 % (04/14 0505) Weight:  [109.3 kg (240 lb 15.4 oz)] 109.3 kg (240 lb 15.4 oz) (04/14 0505) Weight change: 9.2 kg (20 lb 4.5 oz) Last BM Date: 01/06/13  Intake/Output from previous day: 04/13 0701 - 04/14 0700 In: 720 [P.O.:720] Out: 1600 [Urine:1600]  PHYSICAL EXAM General appearance: alert, cooperative and no distress Resp: clear to auscultation bilaterally Cardio: irregularly irregular rhythm GI: soft, non-tender; bowel sounds normal; no masses,  no organomegaly Extremities: extremities normal, atraumatic, no cyanosis or edema  Lab Results:    Basic Metabolic Panel:  Recent Labs  16/10/96 0630 01/08/13 0513  NA 137 136  K 3.2* 3.5  CL 96 96  CO2 30 30  GLUCOSE 148* 141*  BUN 27* 32*  CREATININE 1.79* 1.82*  CALCIUM 8.9 8.6   Liver Function Tests: No results found for this basename: AST, ALT, ALKPHOS, BILITOT, PROT, ALBUMIN,  in the last 72 hours No results found for this basename: LIPASE, AMYLASE,  in the last 72 hours No results found for this basename: AMMONIA,  in the last 72 hours CBC:  Recent Labs  01/07/13 0630 01/08/13 0513  WBC 3.0* 2.9*  NEUTROABS 1.6* 1.7  HGB 9.5* 8.8*  HCT 27.7* 26.1*  MCV 81.7 82.6  PLT 21* 18*   Cardiac Enzymes:  Recent Labs  01/05/13 1400 01/06/13 0135  TROPONINI 4.40* 1.55*   BNP: No results found for this basename:  PROBNP,  in the last 72 hours D-Dimer: No results found for this basename: DDIMER,  in the last 72 hours CBG:  Recent Labs  01/06/13 2334 01/07/13 0749 01/07/13 1125 01/07/13 1640 01/07/13 2128 01/08/13 0740  GLUCAP 112* 136* 152* 146* 173* 135*   Hemoglobin A1C:  Recent Labs  01/05/13 1400  HGBA1C 7.5*   Fasting Lipid Panel: No results found for this basename: CHOL, HDL, LDLCALC, TRIG, CHOLHDL, LDLDIRECT,  in the last 72 hours Thyroid Function Tests: No results found for this basename: TSH, T4TOTAL, FREET4, T3FREE, THYROIDAB,  in the last 72 hours Anemia Panel: No results found for this basename: VITAMINB12, FOLATE, FERRITIN, TIBC, IRON, RETICCTPCT,  in the last 72 hours Coagulation: No results found for this basename: LABPROT, INR,  in the last 72 hours Urine Drug Screen: Drugs of Abuse  No results found for this basename: labopia, cocainscrnur, labbenz, amphetmu, thcu, labbarb    Alcohol Level: No results found for this basename: ETH,  in the last 72 hours Urinalysis: No results found for this basename: COLORURINE, APPERANCEUR, LABSPEC, PHURINE, GLUCOSEU, HGBUR, BILIRUBINUR, KETONESUR, PROTEINUR, UROBILINOGEN, NITRITE, LEUKOCYTESUR,  in the last 72 hours Misc. Labs:  ABGS No results found for this basename: PHART, PCO2, PO2ART, TCO2, HCO3,  in the last 72 hours CULTURES Recent Results (from the past 240 hour(s))  MRSA PCR SCREENING     Status: Abnormal   Collection Time    01/05/13 10:52 AM  Result Value Range Status   MRSA by PCR INVALID RESULTS, SPECIMEN SENT FOR CULTURE (*) NEGATIVE Final   Comment: LEE,B ON 01/05/13 AT 2020 BY LOY,C                The GeneXpert MRSA Assay (FDA     approved for NASAL specimens     only), is one component of a     comprehensive MRSA colonization     surveillance program. It is not     intended to diagnose MRSA     infection nor to guide or     monitor treatment for     MRSA infections.  MRSA CULTURE     Status: None    Collection Time    01/05/13 10:52 AM      Result Value Range Status   Specimen Description NOSE   Final   Special Requests NONE   Final   Culture NO SUSPICIOUS COLONIES, CONTINUING TO HOLD   Final   Report Status PENDING   Incomplete   Studies/Results: No results found.  Medications:  Prior to Admission:  Prescriptions prior to admission  Medication Sig Dispense Refill  . acetaminophen (TYLENOL) 500 MG tablet Take 1 tablet (500 mg total) by mouth every 6 (six) hours as needed. Pain  30 tablet    . amiodarone (PACERONE) 200 MG tablet Take 0.5 tablets (100 mg total) by mouth daily.  30 tablet  5  . amLODipine (NORVASC) 5 MG tablet Take 5 mg by mouth daily.      Marland Kitchen aspirin EC 81 MG tablet Take 81 mg by mouth daily.      . diazepam (VALIUM) 5 MG tablet Take 5 mg by mouth every 6 (six) hours as needed. Muscles Spasms      . furosemide (LASIX) 40 MG tablet Take 1 tablet (40 mg total) by mouth daily.  30 tablet  11  . lenalidomide (REVLIMID) 10 MG capsule Take 1 capsule (10 mg total) by mouth daily.  21 capsule  3  . metFORMIN (GLUCOPHAGE) 500 MG tablet Take 500 mg by mouth 2 (two) times daily with a meal.      . metoprolol tartrate (LOPRESSOR) 25 MG tablet Take 25 mg by mouth 2 (two) times daily.        . Multiple Vitamin (MULTIVITAMIN) tablet Take 1 tablet by mouth daily.        . nitroGLYCERIN (NITROSTAT) 0.4 MG SL tablet Place 1 tablet (0.4 mg total) under the tongue every 5 (five) minutes x 3 doses as needed for chest pain (up to 3 doses).  25 tablet  3  . pregabalin (LYRICA) 75 MG capsule Take 75 mg by mouth daily.      . Red Yeast Rice 600 MG TABS Take 1 tablet by mouth daily.        . silodosin (RAPAFLO) 8 MG CAPS capsule Take 8 mg by mouth daily with breakfast.        . HYDROcodone-acetaminophen (NORCO) 5-325 MG per tablet Take 1 tablet by mouth every 6 (six) hours as needed. Pain       Scheduled: . amiodarone  100 mg Oral Daily  . amLODipine  5 mg Oral Daily  . cefUROXime  500  mg Oral BID WC  . furosemide  40 mg Intravenous Q12H  . guaiFENesin  1,200 mg Oral BID  . insulin aspart  0-15 Units Subcutaneous TID WC  . lenalidomide  10 mg Oral Daily  . metoprolol tartrate  25  mg Oral BID  . potassium chloride  20 mEq Oral BID  . pregabalin  75 mg Oral Daily  . sodium chloride  3 mL Intravenous Q12H  . sodium chloride  3 mL Intravenous Q12H  . tamsulosin  0.4 mg Oral QPC supper   Continuous:  NGE:XBMWUX chloride, acetaminophen, acetaminophen, albuterol, alum & mag hydroxide-simeth, diazepam, HYDROcodone-acetaminophen, morphine injection, nitroGLYCERIN, ondansetron (ZOFRAN) IV, ondansetron, sodium chloride, traZODone  Assesment: He was admitted with acute CHF who suffered a non-ST elevation MI. He has known coronary artery occlusive disease status post bypass grafting. He has atrial fibrillation. He has myelodysplastic syndrome with 5Q minus. Active Problems:   CAD   Atrial fibrillation   Anemia   Myelodysplastic syndrome with 5 q minus   Non-ST elevation MI (NSTEMI)    Plan: He will be discharged home. He'll need close followup of hemoglobin and needs more instruction regarding CHF.    LOS: 3 days   Dayzha Pogosyan L 01/08/2013, 8:44 AM

## 2013-01-08 NOTE — Care Management Note (Signed)
    Page 1 of 1   01/08/2013     9:41:52 AM   CARE MANAGEMENT NOTE 01/08/2013  Patient:  [name], [name]   Account Number:  [id number]  Date Initiated:  01/08/2013  Documentation initiated by:  Sharrie Rothman  Subjective/Objective Assessment:   Pt admitted from home with CHF. Pt lives with his wife and will return home at discharge. Pt is independent with ADL's. Pt does have a walker for home use.     Action/Plan:   No CM needs noted. Pt refuses any HH at this time.   Anticipated DC Date:  01/08/2013   Anticipated DC Plan:  HOME/SELF CARE      DC Planning Services  CM consult      Choice offered to / List presented to:             Status of service:  Completed, signed off Medicare Important Message given?  YES (If response is "NO", the following Medicare IM given date fields will be blank) Date Medicare IM given:  01/08/2013 Date Additional Medicare IM given:    Discharge Disposition:  HOME/SELF CARE  Per UR Regulation:    If discussed at Long Length of Stay Meetings, dates discussed:    Comments:  01/08/13 0940 Arlyss Queen, RN BSN CM

## 2013-01-09 LAB — MRSA CULTURE

## 2013-01-11 ENCOUNTER — Telehealth (HOSPITAL_COMMUNITY): Payer: Self-pay | Admitting: *Deleted

## 2013-01-11 ENCOUNTER — Encounter (HOSPITAL_BASED_OUTPATIENT_CLINIC_OR_DEPARTMENT_OTHER): Payer: Medicare Other

## 2013-01-11 DIAGNOSIS — D46C Myelodysplastic syndrome with isolated del(5q) chromosomal abnormality: Secondary | ICD-10-CM

## 2013-01-11 LAB — CBC WITH DIFFERENTIAL/PLATELET
HCT: 26.6 % — ABNORMAL LOW (ref 39.0–52.0)
Hemoglobin: 8.9 g/dL — ABNORMAL LOW (ref 13.0–17.0)
Lymphs Abs: 1.2 10*3/uL (ref 0.7–4.0)
Monocytes Relative: 10 % (ref 3–12)
Neutro Abs: 1.8 10*3/uL (ref 1.7–7.7)
Neutrophils Relative %: 49 % (ref 43–77)
RBC: 3.13 MIL/uL — ABNORMAL LOW (ref 4.22–5.81)

## 2013-01-11 NOTE — Progress Notes (Signed)
Labs drawn today for cbc/diff 

## 2013-01-11 NOTE — Discharge Summary (Signed)
Physician Discharge Summary  Patient ID: [name] MRN: [id number] DOB/AGE: 11/08/1931 79 y.o. Primary Care Physician:Avionna Bower L, MD Admit date: 01/05/2013 Discharge date: 01/11/2013    Discharge Diagnoses:   Principal Problem:   Acute on chronic systolic heart failure Active Problems:   CAD   Atrial fibrillation   Anemia   Myelodysplastic syndrome with 5 q minus   Non-ST elevation MI (NSTEMI)   Other pancytopenia     Medication List    TAKE these medications       acetaminophen 500 MG tablet  Commonly known as:  TYLENOL  Take 1 tablet (500 mg total) by mouth every 6 (six) hours as needed. Pain     albuterol 108 (90 BASE) MCG/ACT inhaler  Commonly known as:  PROVENTIL HFA;VENTOLIN HFA  Inhale 2 puffs into the lungs every 6 (six) hours as needed for wheezing.     amiodarone 200 MG tablet  Commonly known as:  PACERONE  Take 0.5 tablets (100 mg total) by mouth daily.     amLODipine 5 MG tablet  Commonly known as:  NORVASC  Take 5 mg by mouth daily.     aspirin EC 81 MG tablet  Take 81 mg by mouth daily.     cefUROXime 500 MG tablet  Commonly known as:  CEFTIN  Take 1 tablet (500 mg total) by mouth 2 (two) times daily with a meal.     diazepam 5 MG tablet  Commonly known as:  VALIUM  Take 5 mg by mouth every 6 (six) hours as needed. Muscles Spasms     furosemide 40 MG tablet  Commonly known as:  LASIX  Take 1 tablet (40 mg total) by mouth daily. Take one half tablet daily on a routine basis. If your weight goes up by 3 pounds or if you have edema of your legs take one full tablet twice a day for 3 days     HYDROcodone-acetaminophen 5-325 MG per tablet  Commonly known as:  NORCO/VICODIN  Take 1 tablet by mouth every 6 (six) hours as needed. Pain     lenalidomide 10 MG capsule  Commonly known as:  REVLIMID  Take 1 capsule (10 mg total) by mouth daily.     metFORMIN 500 MG tablet  Commonly known as:  GLUCOPHAGE  Take 500 mg by mouth 2 (two)  times daily with a meal.     metoprolol tartrate 25 MG tablet  Commonly known as:  LOPRESSOR  Take 25 mg by mouth 2 (two) times daily.     multivitamin tablet  Take 1 tablet by mouth daily.     nitroGLYCERIN 0.4 MG SL tablet  Commonly known as:  NITROSTAT  Place 1 tablet (0.4 mg total) under the tongue every 5 (five) minutes x 3 doses as needed for chest pain (up to 3 doses).     pregabalin 75 MG capsule  Commonly known as:  LYRICA  Take 75 mg by mouth daily.     RAPAFLO 8 MG Caps capsule  Generic drug:  silodosin  Take 8 mg by mouth daily with breakfast.     Red Yeast Rice 600 MG Tabs  Take 1 tablet by mouth daily.        Discharged Condition: Improved    Consults: Cardiology/oncology  Significant Diagnostic Studies: Dg Chest Portable 1 View  01/05/2013  *RADIOLOGY REPORT*  Clinical Data: Short of breath  PORTABLE CHEST - 1 VIEW  Comparison: Prior chest x-ray 10/25/2012  Findings: Increased pulmonary vascular  congestion with mild interstitial edema.  Linear scarring versus atelectasis in the peripheral lingula similar to prior.  No significant interval change in mild cardiomegaly.  The patient is status post median sternotomy with evidence of prior multivessel CABG including LIMA to LAD bypass.  No acute osseous abnormality.  IMPRESSION:  1.  Increased pulmonary vascular congestion with mild interstitial edema suggesting developing CHF. 2.  Stable cardiomegaly   Original Report Authenticated By: Malachy Moan, M.D.     Lab Results: Basic Metabolic Panel: No results found for this basename: NA, K, CL, CO2, GLUCOSE, BUN, CREATININE, CALCIUM, MG, PHOS,  in the last 72 hours Liver Function Tests: No results found for this basename: AST, ALT, ALKPHOS, BILITOT, PROT, ALBUMIN,  in the last 72 hours   CBC:  Recent Labs  01/11/13 0840  WBC 3.7*  NEUTROABS 1.8  HGB 8.9*  HCT 26.6*  MCV 85.0  PLT 21*    Recent Results (from the past 240 hour(s))  MRSA PCR SCREENING      Status: Abnormal   Collection Time    01/05/13 10:52 AM      Result Value Range Status   MRSA by PCR INVALID RESULTS, SPECIMEN SENT FOR CULTURE (*) NEGATIVE Final   Comment: LEE,B ON 01/05/13 AT 2020 BY LOY,C                The GeneXpert MRSA Assay (FDA     approved for NASAL specimens     only), is one component of a     comprehensive MRSA colonization     surveillance program. It is not     intended to diagnose MRSA     infection nor to guide or     monitor treatment for     MRSA infections.  MRSA CULTURE     Status: None   Collection Time    01/05/13 10:52 AM      Result Value Range Status   Specimen Description NOSE   Final   Special Requests NONE   Final   Culture     Final   Value: NO STAPHYLOCOCCUS AUREUS ISOLATED     Note: NOMRSA   Report Status 01/09/2013 FINAL   Final     Hospital Course: He was admitted with what appeared to be acute pulmonary edema. His chest x-ray did not show evidence of this but he was very short of breath and had a very elevated BNP. He was more anemic and in the past he has developed problems with CHF when he becomes more anemic. He felt this was rather sudden in onset but throughout his hospital course it turns out that he had had increasing shortness of breath or several days and had increasing edema. He was treated by getting 3 units of packed red blood cells receiving intravenous diuresis and he improved. He was able to ambulate in the halls.  Discharge Exam: Blood pressure 119/60, pulse 63, temperature 97.2 F (36.2 C), temperature source Oral, resp. rate 20, height 5\' 11"  (1.803 m), weight 109.3 kg (240 lb 15.4 oz), SpO2 90.00%. He is awake and alert. His chest is clear. His heart is irregular. He has only trace edema of the extremities  Disposition: Home he will follow up with cardiology in my office and an oncology office      Discharge Orders   Future Appointments Provider Department Dept Phone   01/15/2013 8:40 AM [facility] [phone]   01/18/2013 9:40 AM [facility] Pattricia Boss  [facility] [phone]   01/22/2013 8:40 AM [facility] [phone]   01/25/2013 9:20 AM [facility] [phone]   01/29/2013 8:50 AM [facility] [phone]   02/01/2013 9:30 AM [facility] [phone]   02/05/2013 8:40 AM [facility] [phone]   02/08/2013 9:40 AM [facility] [phone]   02/12/2013 8:40 AM [facility] [phone]   02/15/2013 2:30 PM Ellouise Newer, PA-C [facility] [phone]   06/13/2013 1:00 PM [facility] [phone]   06/13/2013 2:00 PM Chuck Hint, MD [facility] [phone]   Future Orders Complete By Expires     Discharge patient  As directed        Follow-up Information   Follow up with Harish Bram L, MD On 01/15/2013. (Your appointment is at 9:00 am)    Contact information:   [address] [place] [address] [phone]       Signed: [name] Pager [phone]  01/11/2013, 6:38 PM

## 2013-01-11 NOTE — Telephone Encounter (Signed)
.  CRITICAL VALUE ALERT Critical value received:  Platelets 21,000 Date of notification:  01/11/2013 Time of notification: 0927 Critical value read back:  yes Nurse who received alert:  TAR MD notified (1st page):  [id number]

## 2013-01-15 ENCOUNTER — Telehealth (HOSPITAL_COMMUNITY): Payer: Self-pay

## 2013-01-15 ENCOUNTER — Other Ambulatory Visit (HOSPITAL_COMMUNITY): Payer: Medicare Other

## 2013-01-15 ENCOUNTER — Encounter (HOSPITAL_BASED_OUTPATIENT_CLINIC_OR_DEPARTMENT_OTHER): Payer: Medicare Other

## 2013-01-15 ENCOUNTER — Encounter (HOSPITAL_COMMUNITY): Payer: Self-pay

## 2013-01-15 DIAGNOSIS — D46C Myelodysplastic syndrome with isolated del(5q) chromosomal abnormality: Secondary | ICD-10-CM

## 2013-01-15 LAB — CBC WITH DIFFERENTIAL/PLATELET
Basophils Absolute: 0 10*3/uL (ref 0.0–0.1)
Basophils Relative: 1 % (ref 0–1)
HCT: 24.6 % — ABNORMAL LOW (ref 39.0–52.0)
Hemoglobin: 8.1 g/dL — ABNORMAL LOW (ref 13.0–17.0)
Lymphocytes Relative: 30 % (ref 12–46)
MCHC: 32.9 g/dL (ref 30.0–36.0)
Monocytes Relative: 10 % (ref 3–12)
Neutro Abs: 1.4 10*3/uL — ABNORMAL LOW (ref 1.7–7.7)
Neutrophils Relative %: 48 % (ref 43–77)
WBC: 2.9 10*3/uL — ABNORMAL LOW (ref 4.0–10.5)

## 2013-01-15 NOTE — Telephone Encounter (Signed)
Wife called and informed of lab results and need for transfusion.  Appointment made for Tuesday 01/16/13.

## 2013-01-15 NOTE — Progress Notes (Signed)
Labs drawn today for cbc/diff 

## 2013-01-15 NOTE — Progress Notes (Signed)
CRITICAL VALUE ALERT Critical value received:  PLT 27 Date of notification:  01/15/2013  Time of notification: 0915 Critical value read back:  yes Nurse who received alert:  Ela Moffat, Blair Hailey, RN MD notified (1st page):  Dr. Mariel Sleet - no further follow-up at this time.

## 2013-01-15 NOTE — Addendum Note (Signed)
Addended by: Hester Mates A on: 01/15/2013 11:46 AM   Modules accepted: Orders, SmartSet

## 2013-01-16 ENCOUNTER — Encounter (HOSPITAL_BASED_OUTPATIENT_CLINIC_OR_DEPARTMENT_OTHER): Payer: Medicare Other

## 2013-01-16 VITALS — BP 117/46 | HR 62 | Temp 97.4°F | Resp 18

## 2013-01-16 DIAGNOSIS — D46C Myelodysplastic syndrome with isolated del(5q) chromosomal abnormality: Secondary | ICD-10-CM

## 2013-01-16 MED ORDER — FUROSEMIDE 10 MG/ML IJ SOLN: 20.0000 mg | Freq: Once | INTRAMUSCULAR | Status: DC

## 2013-01-16 MED ORDER — HEPARIN SOD (PORK) LOCK FLUSH 100 UNIT/ML IV SOLN
500.0000 [IU] | Freq: Every day | INTRAVENOUS | Status: AC | PRN
  Administered 2013-01-16: 500 [IU]
  Filled 2013-01-16: qty 5

## 2013-01-16 MED ORDER — SODIUM CHLORIDE 0.9 % IJ SOLN
10.0000 mL | INTRAMUSCULAR | Status: AC | PRN
  Administered 2013-01-16: 10 mL
  Filled 2013-01-16: qty 10

## 2013-01-16 MED ORDER — SODIUM CHLORIDE 0.9 % IV SOLN
250.0000 mL | Freq: Once | INTRAVENOUS | Status: AC
  Administered 2013-01-16: 250 mL via INTRAVENOUS

## 2013-01-16 NOTE — Progress Notes (Signed)
Tolerated transfusion well. 

## 2013-01-17 LAB — TYPE AND SCREEN
Antibody Screen: NEGATIVE
Unit division: 0

## 2013-01-18 ENCOUNTER — Encounter (HOSPITAL_BASED_OUTPATIENT_CLINIC_OR_DEPARTMENT_OTHER): Payer: Medicare Other

## 2013-01-18 DIAGNOSIS — D46C Myelodysplastic syndrome with isolated del(5q) chromosomal abnormality: Secondary | ICD-10-CM

## 2013-01-18 LAB — CBC WITH DIFFERENTIAL/PLATELET
Basophils Relative: 1 % (ref 0–1)
HCT: 30.7 % — ABNORMAL LOW (ref 39.0–52.0)
Hemoglobin: 10.1 g/dL — ABNORMAL LOW (ref 13.0–17.0)
Lymphocytes Relative: 33 % (ref 12–46)
Lymphs Abs: 1 10*3/uL (ref 0.7–4.0)
Monocytes Absolute: 0.2 10*3/uL (ref 0.1–1.0)
Monocytes Relative: 7 % (ref 3–12)
Neutro Abs: 1.3 10*3/uL — ABNORMAL LOW (ref 1.7–7.7)
Neutrophils Relative %: 44 % (ref 43–77)
RBC: 3.55 MIL/uL — ABNORMAL LOW (ref 4.22–5.81)
WBC: 2.9 10*3/uL — ABNORMAL LOW (ref 4.0–10.5)

## 2013-01-18 NOTE — Progress Notes (Signed)
Labs drawn today for cbc/diff 

## 2013-01-22 ENCOUNTER — Encounter (HOSPITAL_BASED_OUTPATIENT_CLINIC_OR_DEPARTMENT_OTHER): Payer: Medicare Other

## 2013-01-22 DIAGNOSIS — D46C Myelodysplastic syndrome with isolated del(5q) chromosomal abnormality: Secondary | ICD-10-CM

## 2013-01-22 LAB — CBC WITH DIFFERENTIAL/PLATELET
Eosinophils Absolute: 0.4 10*3/uL (ref 0.0–0.7)
HCT: 31.4 % — ABNORMAL LOW (ref 39.0–52.0)
Hemoglobin: 10.2 g/dL — ABNORMAL LOW (ref 13.0–17.0)
Lymphs Abs: 1.2 10*3/uL (ref 0.7–4.0)
MCH: 28.4 pg (ref 26.0–34.0)
MCHC: 32.5 g/dL (ref 30.0–36.0)
Monocytes Absolute: 0.2 10*3/uL (ref 0.1–1.0)
Monocytes Relative: 7 % (ref 3–12)
Neutrophils Relative %: 31 % — ABNORMAL LOW (ref 43–77)
RBC: 3.59 MIL/uL — ABNORMAL LOW (ref 4.22–5.81)

## 2013-01-22 NOTE — Progress Notes (Signed)
Labs drawn today for cbc/diff 

## 2013-01-23 ENCOUNTER — Other Ambulatory Visit (HOSPITAL_COMMUNITY): Payer: Self-pay | Admitting: Oncology

## 2013-01-23 DIAGNOSIS — D46C Myelodysplastic syndrome with isolated del(5q) chromosomal abnormality: Secondary | ICD-10-CM

## 2013-01-23 MED ORDER — LENALIDOMIDE 10 MG PO CAPS: 10.0000 mg | ORAL_CAPSULE | Freq: Every day | ORAL | Status: DC

## 2013-01-25 ENCOUNTER — Encounter (HOSPITAL_COMMUNITY): Payer: Medicare Other | Attending: Oncology

## 2013-01-25 DIAGNOSIS — D46C Myelodysplastic syndrome with isolated del(5q) chromosomal abnormality: Secondary | ICD-10-CM

## 2013-01-25 LAB — CBC WITH DIFFERENTIAL/PLATELET
Basophils Absolute: 0 10*3/uL (ref 0.0–0.1)
Eosinophils Absolute: 0.3 10*3/uL (ref 0.0–0.7)
Eosinophils Relative: 10 % — ABNORMAL HIGH (ref 0–5)
HCT: 32.8 % — ABNORMAL LOW (ref 39.0–52.0)
Lymphocytes Relative: 51 % — ABNORMAL HIGH (ref 12–46)
MCH: 28.9 pg (ref 26.0–34.0)
MCHC: 32.9 g/dL (ref 30.0–36.0)
MCV: 87.7 fL (ref 78.0–100.0)
Monocytes Absolute: 0.2 10*3/uL (ref 0.1–1.0)
Platelets: 61 10*3/uL — ABNORMAL LOW (ref 150–400)
RDW: 18.9 % — ABNORMAL HIGH (ref 11.5–15.5)

## 2013-01-25 NOTE — Progress Notes (Signed)
Labs drawn today for cbc/diff 

## 2013-01-29 ENCOUNTER — Encounter (HOSPITAL_BASED_OUTPATIENT_CLINIC_OR_DEPARTMENT_OTHER): Payer: Medicare Other

## 2013-01-29 DIAGNOSIS — D46C Myelodysplastic syndrome with isolated del(5q) chromosomal abnormality: Secondary | ICD-10-CM

## 2013-01-29 LAB — CBC WITH DIFFERENTIAL/PLATELET
Basophils Absolute: 0 10*3/uL (ref 0.0–0.1)
Basophils Relative: 2 % — ABNORMAL HIGH (ref 0–1)
Eosinophils Relative: 6 % — ABNORMAL HIGH (ref 0–5)
HCT: 30.1 % — ABNORMAL LOW (ref 39.0–52.0)
Lymphocytes Relative: 48 % — ABNORMAL HIGH (ref 12–46)
MCHC: 32.6 g/dL (ref 30.0–36.0)
Monocytes Absolute: 0.2 10*3/uL (ref 0.1–1.0)
Neutro Abs: 0.9 10*3/uL — ABNORMAL LOW (ref 1.7–7.7)
Platelets: 51 10*3/uL — ABNORMAL LOW (ref 150–400)
RDW: 19.8 % — ABNORMAL HIGH (ref 11.5–15.5)
WBC: 2.5 10*3/uL — ABNORMAL LOW (ref 4.0–10.5)

## 2013-01-29 NOTE — Progress Notes (Signed)
Labs drawn today for cbc/diff 

## 2013-02-01 ENCOUNTER — Encounter (HOSPITAL_BASED_OUTPATIENT_CLINIC_OR_DEPARTMENT_OTHER): Payer: Medicare Other

## 2013-02-01 DIAGNOSIS — D46C Myelodysplastic syndrome with isolated del(5q) chromosomal abnormality: Secondary | ICD-10-CM

## 2013-02-01 LAB — CBC WITH DIFFERENTIAL/PLATELET
Basophils Absolute: 0.1 10*3/uL (ref 0.0–0.1)
Basophils Relative: 3 % — ABNORMAL HIGH (ref 0–1)
Lymphocytes Relative: 37 % (ref 12–46)
MCHC: 33.1 g/dL (ref 30.0–36.0)
Neutro Abs: 1.1 10*3/uL — ABNORMAL LOW (ref 1.7–7.7)
Neutrophils Relative %: 40 % — ABNORMAL LOW (ref 43–77)
RDW: 20.6 % — ABNORMAL HIGH (ref 11.5–15.5)
Smear Review: DECREASED
WBC: 2.8 10*3/uL — ABNORMAL LOW (ref 4.0–10.5)

## 2013-02-01 NOTE — Progress Notes (Signed)
Labs drawn today for cbc/diff 

## 2013-02-05 ENCOUNTER — Encounter (HOSPITAL_BASED_OUTPATIENT_CLINIC_OR_DEPARTMENT_OTHER): Payer: Medicare Other

## 2013-02-05 DIAGNOSIS — D46C Myelodysplastic syndrome with isolated del(5q) chromosomal abnormality: Secondary | ICD-10-CM

## 2013-02-05 LAB — CBC WITH DIFFERENTIAL/PLATELET
Eosinophils Absolute: 0.3 10*3/uL (ref 0.0–0.7)
Hemoglobin: 10 g/dL — ABNORMAL LOW (ref 13.0–17.0)
Lymphocytes Relative: 35 % (ref 12–46)
Lymphs Abs: 0.9 10*3/uL (ref 0.7–4.0)
Neutro Abs: 1.1 10*3/uL — ABNORMAL LOW (ref 1.7–7.7)
Neutrophils Relative %: 44 % (ref 43–77)
Platelets: 54 10*3/uL — ABNORMAL LOW (ref 150–400)
RBC: 3.41 MIL/uL — ABNORMAL LOW (ref 4.22–5.81)
WBC: 2.5 10*3/uL — ABNORMAL LOW (ref 4.0–10.5)

## 2013-02-05 NOTE — Progress Notes (Signed)
Labs drawn today for cbc/diff 

## 2013-02-08 ENCOUNTER — Encounter (HOSPITAL_BASED_OUTPATIENT_CLINIC_OR_DEPARTMENT_OTHER): Payer: Medicare Other

## 2013-02-08 DIAGNOSIS — D46C Myelodysplastic syndrome with isolated del(5q) chromosomal abnormality: Secondary | ICD-10-CM

## 2013-02-08 LAB — CBC
HCT: 30.7 % — ABNORMAL LOW (ref 39.0–52.0)
Hemoglobin: 9.8 g/dL — ABNORMAL LOW (ref 13.0–17.0)
MCV: 92.2 fL (ref 78.0–100.0)
Platelets: 56 10*3/uL — ABNORMAL LOW (ref 150–400)
RBC: 3.33 MIL/uL — ABNORMAL LOW (ref 4.22–5.81)
WBC: 3.5 10*3/uL — ABNORMAL LOW (ref 4.0–10.5)

## 2013-02-08 LAB — DIFFERENTIAL
Eosinophils Relative: 11 % — ABNORMAL HIGH (ref 0–5)
Lymphocytes Relative: 30 % (ref 12–46)
Lymphs Abs: 1 10*3/uL (ref 0.7–4.0)
Monocytes Relative: 6 % (ref 3–12)

## 2013-02-08 NOTE — Progress Notes (Signed)
Labs drawn today for cbc/diff 

## 2013-02-12 ENCOUNTER — Encounter (HOSPITAL_BASED_OUTPATIENT_CLINIC_OR_DEPARTMENT_OTHER): Payer: Medicare Other

## 2013-02-12 DIAGNOSIS — D46C Myelodysplastic syndrome with isolated del(5q) chromosomal abnormality: Secondary | ICD-10-CM

## 2013-02-12 LAB — CBC
HCT: 30.8 % — ABNORMAL LOW (ref 39.0–52.0)
MCV: 92.5 fL (ref 78.0–100.0)
RDW: 21 % — ABNORMAL HIGH (ref 11.5–15.5)
WBC: 3.2 10*3/uL — ABNORMAL LOW (ref 4.0–10.5)

## 2013-02-12 LAB — DIFFERENTIAL
Eosinophils Relative: 12 % — ABNORMAL HIGH (ref 0–5)
Lymphocytes Relative: 30 % (ref 12–46)
Lymphs Abs: 1 10*3/uL (ref 0.7–4.0)
Monocytes Absolute: 0.3 10*3/uL (ref 0.1–1.0)

## 2013-02-12 NOTE — Progress Notes (Signed)
Labs drawn today for cbc/diff 

## 2013-02-14 NOTE — Progress Notes (Signed)
[name], MD [address] [place] [address]  Myelodysplastic syndrome with 5 q minus - Plan: lenalidomide (REVLIMID) 10 MG capsule, CBC with Differential  CURRENT THERAPY:Revlimid 10 mg 21 days on and 7 days off  INTERVAL HISTORY: JORGE RETZ 79 y.o. male returns for  regular  visit for followup of myelodysplastic syndrome with the 5 q. minus abnormality.   I personally reviewed and went over laboratory results with the patient.  WBC, Hgb, and platelets are all low, but very stable.    He is tolerating the Revlimid well.   His last PRBC transfusion was on 01/16/2013 and he has not needed one since.  He is pleased with this information.  He asks if he can restart his spinal injections for pain that he was getting from pain management.  With his ANC being greater than 500 and a platelet count ranging in the 50's he is clear from a hematologic standpoint to have this done.  This was confirmed by Dr. Erline Hau.  We spent our time today discussing an increase in his Revlimid dosing to 10 mg daily.  He is due to go on his week break starting this Sunday, 02/18/2013.  I have asked him to follow through with this break and when he is due to restart, he will increase his dose to 10 mg daily.  He is agreeable to this.   With his decreased requirement for PRBC transfusions, we will space out his lab appointments to weekly.  He is fine with this plan.   Hematologically, he denies any complaints and ROS questioning is negative.   Past Medical History  Diagnosis Date  . Hypertension   . COPD (chronic obstructive pulmonary disease)   . CAD (coronary artery disease)     s/p 4V CABG '96, Myoview '10 w/o ischemia  . Arthritis   . Atrial fibrillation     coumadin stopped 12/2011 2/2 anemia  . Dysphagia   . Anemia     requiring blood transfusions  . Carotid stenosis     Dr Waverly Ferrari follows.   . Diabetes mellitus, type 2     A1C 6.7% 12/2011  . HLD  (hyperlipidemia)     LDL 65 12/2011, intolerant to statins  . Hematuria     felt 2/2 foley insertion  . LV dysfunction     EF 45-50% by echo 2007  . Melena     EGD & Colonoscopy 09/2011 revealed gastritis, erosions, scars, diverticula, 8 colon polyps (largest 1.6cm, not removed 2/2 anticoagulation), small AVM in transverse colon  . History of tobacco abuse     quit 1992  . Anxiety   . Shortness of breath     exertion  . GERD (gastroesophageal reflux disease)   . Myocardial infarction   . Thrombocytopenia 09/01/2012  . Myelodysplastic syndrome with 5 q minus 10/17/2012    On Revlimid 5 mg daily 21 days on and 7 days off    has DYSLIPIDEMIA; CAD; Atrial fibrillation; PVD; OSTEOARTHRITIS; CAROTID ARTERY STENOSIS; Long term current use of anticoagulant; GI bleeding; AVM (arteriovenous malformation) of colon without hemorrhage; Angina effort; Anemia; History of colonic polyps; Myelodysplastic syndrome with 5 q minus; High output heart failure; Non-ST elevation MI (NSTEMI); Acute on chronic systolic heart failure; and Other pancytopenia on his problem list.     is allergic to statins and tape.  Mr. Holland had no medications administered during this visit.  Past Surgical History  Procedure Laterality Date  .  Knee surgery  1960    Right knee cartilage  . Rotator cuff repair  1990    right   . Inguinal hernia repair  2000 and 2010    left  . Cholecystectomy  05/2010    Lap chole with biologic mesh repair/reinforcement by  Dr Zachery Dakins  . Back surgery  02/2006    ruptured disk,  post op diskitis infection requiring prolonged hospital stay and  surgical I & D  . Tonsillectomy    . Esophagogastroduodenoscopy  10/08/2011    Procedure: ESOPHAGOGASTRODUODENOSCOPY (EGD);  Surgeon: Malissa Hippo, MD;  Location: [facility];  Service: Endoscopy;  Laterality: N/A;  . Colonoscopy  10/08/2011    Procedure: COLONOSCOPY;  Surgeon: Malissa Hippo, MD;  Location: [facility];  Service: Endoscopy;   Laterality: N/A;  . Cataract extraction, bilateral    . Peg placement  07/2006    placed due to prolonged infection, poor po intake, malnutrition during several week hospital stay resulting from ruptured disc that became infected following surgery  . Colonoscopy  01/14/2012    Procedure: COLONOSCOPY;  Surgeon: Malissa Hippo, MD;  Location: [facility];  Service: Endoscopy;  Laterality: N/A;  945  . Coronary artery bypass graft  1996    4 by-pass  . Ventral hernia repair    . Colonoscopy  04/20/2012    Procedure: COLONOSCOPY;  Surgeon: Malissa Hippo, MD;  Location: [facility];  Service: Endoscopy;  Laterality: N/A;  1030  . Pr vein bypass graft,aorto-fem-pop  1996  . Spine surgery    . Joint replacement  1960    Right Knee  . Lymph node biopsy  08/15/2012    Procedure: LYMPH NODE BIOPSY;  Surgeon: Marlane Hatcher, MD;  Location: [facility];  Service: General;  Laterality: Left;  Cervical Lymph Node Bx in Minor Room  . Bone marrow biopsy    . Bone marrow aspiration      Denies any headaches, dizziness, double vision, fevers, chills, night sweats, nausea, vomiting, diarrhea, constipation, chest pain, heart palpitations, shortness of breath, blood in stool, black tarry stool, urinary pain, urinary burning, urinary frequency, hematuria.   PHYSICAL EXAMINATION  ECOG PERFORMANCE STATUS: 0 - Asymptomatic  Filed Vitals:   02/15/13 1400  BP: 123/63  Pulse: 60  Temp: 97.9 F (36.6 C)  Resp: 18    GENERAL:alert, no distress, well nourished, well developed, comfortable, cooperative, obese and smiling SKIN: skin color, texture, turgor are normal, no rashes or significant lesions HEAD: Normocephalic, No masses, lesions, tenderness or abnormalities EYES: normal, Conjunctiva are pink and non-injected EARS: External ears normal OROPHARYNX:mucous membranes are moist  NECK: supple, no adenopathy, thyroid normal size, non-tender, without nodularity, no stridor, non-tender, trachea  midline LYMPH:  no palpable lymphadenopathy BREAST:not examined LUNGS: clear to auscultation and percussion HEART: regular rate & rhythm, no murmurs, no gallops, S1 normal and S2 normal ABDOMEN:abdomen soft, non-tender, obese and normal bowel sounds BACK: Back symmetric, no curvature., No CVA tenderness EXTREMITIES:less then 2 second capillary refill, no joint deformities, effusion, or inflammation, no edema, no skin discoloration, no clubbing, no cyanosis  NEURO: alert & oriented x 3 with fluent speech, no focal motor/sensory deficits, gait normal   LABORATORY DATA: CBC    Component Value Date/Time   WBC 3.2* 02/12/2013 0831   RBC 3.33* 02/12/2013 0831   HGB 10.0* 02/12/2013 0831   HCT 30.8* 02/12/2013 0831   PLT 51* 02/12/2013 0831   MCV 92.5 02/12/2013 0831  MCH 30.0 02/12/2013 0831   MCHC 32.5 02/12/2013 0831   RDW 21.0* 02/12/2013 0831   LYMPHSABS 1.0 02/12/2013 0831   MONOABS 0.3 02/12/2013 0831   EOSABS 0.4 02/12/2013 0831   BASOSABS 0.0 02/12/2013 0831      ASSESSMENT:  1. MDS with 5 q- syndrome. On Revlimid 10 mg daily, 21 days on and 7 days off. We will increase to 10 mg daily. 2. Pancytopenia, stable  Patient Active Problem List   Diagnosis Date Noted  . Acute on chronic systolic heart failure 01/08/2013  . Other pancytopenia 01/08/2013  . Non-ST elevation MI (NSTEMI) 01/06/2013  . High output heart failure 10/25/2012  . Myelodysplastic syndrome with 5 q minus 10/17/2012  . History of colonic polyps 02/28/2012  . Anemia 01/10/2012  . Angina effort 01/06/2012  . AVM (arteriovenous malformation) of colon without hemorrhage 01/04/2012  . GI bleeding 10/09/2011  . Long term current use of anticoagulant 12/22/2010  . CAROTID ARTERY STENOSIS 10/16/2010  . DYSLIPIDEMIA 04/11/2009  . CAD 04/11/2009  . Atrial fibrillation 04/11/2009  . PVD 04/11/2009  . OSTEOARTHRITIS 04/11/2009     PLAN:  1. I personally reviewed and went over laboratory results with the patient. 2.  Will increase Revlimid to 10 mg daily 3. Will space out labs to weekly.  4. New Revlimid Rx written and provided to [employer]. 5. Start 10 mg of Revlimid daily after upcoming 7 day break from therapy.  6. Patient may have his back injections since his ANC is greater than 500 and platelets are above 50. 7. Return for follow-up in 6 weeks.   All questions were answered. The patient knows to call the clinic with any problems, questions or concerns. We can certainly see the patient much sooner if necessary.  Patient and plan discussed with Dr. Erline Hau and he is in agreement with the aforementioned.   KEFALAS,THOMAS

## 2013-02-15 ENCOUNTER — Encounter (HOSPITAL_BASED_OUTPATIENT_CLINIC_OR_DEPARTMENT_OTHER): Payer: Medicare Other | Admitting: Oncology

## 2013-02-15 ENCOUNTER — Encounter (HOSPITAL_COMMUNITY): Payer: Self-pay | Admitting: Oncology

## 2013-02-15 VITALS — BP 123/63 | HR 60 | Temp 97.9°F | Resp 18 | Wt 200.8 lb

## 2013-02-15 DIAGNOSIS — D46C Myelodysplastic syndrome with isolated del(5q) chromosomal abnormality: Secondary | ICD-10-CM

## 2013-02-15 DIAGNOSIS — D61818 Other pancytopenia: Secondary | ICD-10-CM

## 2013-02-15 LAB — CBC WITH DIFFERENTIAL/PLATELET
Eosinophils Absolute: 0.4 10*3/uL (ref 0.0–0.7)
Lymphocytes Relative: 34 % (ref 12–46)
MCH: 30.1 pg (ref 26.0–34.0)
MCHC: 32.7 g/dL (ref 30.0–36.0)
Monocytes Absolute: 0.3 10*3/uL (ref 0.1–1.0)
Neutrophils Relative %: 43 % (ref 43–77)
Platelets: 62 10*3/uL — ABNORMAL LOW (ref 150–400)
RBC: 3.45 MIL/uL — ABNORMAL LOW (ref 4.22–5.81)
WBC Morphology: INCREASED

## 2013-02-15 MED ORDER — LENALIDOMIDE 10 MG PO CAPS: 10.0000 mg | ORAL_CAPSULE | Freq: Every day | ORAL | Status: DC

## 2013-02-15 NOTE — Patient Instructions (Addendum)
[facility] Discharge Instructions  RECOMMENDATIONS MADE BY THE CONSULTANT AND ANY TEST RESULTS WILL BE SENT TO YOUR REFERRING PHYSICIAN.  We will start lab work today and then once weekly. Increase Revlimid to 10 mg daily. It is ok for you to have a spinal injection if needed. Return to see MD in 6 weeks. Report any issues/concerns to clinic as needed.  Thank you for choosing [facility] to provide your oncology and hematology care.  To afford each patient quality time with our providers, please arrive at least 15 minutes before your scheduled appointment time.  With your help, our goal is to use those 15 minutes to complete the necessary work-up to ensure our physicians have the information they need to help with your evaluation and healthcare recommendations.    Effective January 1st, 2014, we ask that you re-schedule your appointment with our physicians should you arrive 10 or more minutes late for your appointment.  We strive to give you quality time with our providers, and arriving late affects you and other patients whose appointments are after yours.    Again, thank you for choosing [facility].  Our hope is that these requests will decrease the amount of time that you wait before being seen by our physicians.       _____________________________________________________________  Should you have questions after your visit to [facility], please contact our office at [phone] between the hours of 8:30 a.m. and 5:00 p.m.  Voicemails left after 4:30 p.m. will not be returned until the following business day.  For prescription refill requests, have your pharmacy contact our office with your prescription refill request.

## 2013-02-20 ENCOUNTER — Encounter (HOSPITAL_BASED_OUTPATIENT_CLINIC_OR_DEPARTMENT_OTHER): Payer: Medicare Other

## 2013-02-20 DIAGNOSIS — D46C Myelodysplastic syndrome with isolated del(5q) chromosomal abnormality: Secondary | ICD-10-CM

## 2013-02-20 LAB — CBC WITH DIFFERENTIAL/PLATELET
Basophils Absolute: 0.1 10*3/uL (ref 0.0–0.1)
Basophils Relative: 1 % (ref 0–1)
Eosinophils Absolute: 0.7 10*3/uL (ref 0.0–0.7)
MCH: 29.9 pg (ref 26.0–34.0)
MCHC: 32 g/dL (ref 30.0–36.0)
Neutrophils Relative %: 34 % — ABNORMAL LOW (ref 43–77)
Platelets: 64 10*3/uL — ABNORMAL LOW (ref 150–400)
RDW: 21.1 % — ABNORMAL HIGH (ref 11.5–15.5)

## 2013-02-20 NOTE — Progress Notes (Signed)
Labs drawn today for cbc/diff 

## 2013-02-26 ENCOUNTER — Encounter (HOSPITAL_COMMUNITY): Payer: Medicare Other | Attending: Oncology

## 2013-02-26 ENCOUNTER — Other Ambulatory Visit: Payer: Self-pay | Admitting: Internal Medicine

## 2013-02-26 DIAGNOSIS — D46C Myelodysplastic syndrome with isolated del(5q) chromosomal abnormality: Secondary | ICD-10-CM | POA: Insufficient documentation

## 2013-02-26 LAB — CBC WITH DIFFERENTIAL/PLATELET
Eosinophils Absolute: 0.2 10*3/uL (ref 0.0–0.7)
Lymphs Abs: 1.2 10*3/uL (ref 0.7–4.0)
MCH: 30.8 pg (ref 26.0–34.0)
Neutro Abs: 1.5 10*3/uL — ABNORMAL LOW (ref 1.7–7.7)
Neutrophils Relative %: 45 % (ref 43–77)
Platelets: 78 10*3/uL — ABNORMAL LOW (ref 150–400)
RBC: 3.54 MIL/uL — ABNORMAL LOW (ref 4.22–5.81)
WBC: 3.2 10*3/uL — ABNORMAL LOW (ref 4.0–10.5)

## 2013-02-26 NOTE — Progress Notes (Signed)
Labs drawn today for cbc/diff 

## 2013-03-05 ENCOUNTER — Encounter (HOSPITAL_BASED_OUTPATIENT_CLINIC_OR_DEPARTMENT_OTHER): Payer: Medicare Other

## 2013-03-05 DIAGNOSIS — D46C Myelodysplastic syndrome with isolated del(5q) chromosomal abnormality: Secondary | ICD-10-CM

## 2013-03-05 LAB — CBC WITH DIFFERENTIAL/PLATELET
Eosinophils Relative: 8 % — ABNORMAL HIGH (ref 0–5)
Lymphocytes Relative: 36 % (ref 12–46)
Lymphs Abs: 1 10*3/uL (ref 0.7–4.0)
MCV: 93 fL (ref 78.0–100.0)
Neutrophils Relative %: 47 % (ref 43–77)
Platelets: 80 10*3/uL — ABNORMAL LOW (ref 150–400)
RBC: 3.57 MIL/uL — ABNORMAL LOW (ref 4.22–5.81)
Smear Review: DECREASED
WBC: 2.8 10*3/uL — ABNORMAL LOW (ref 4.0–10.5)

## 2013-03-05 NOTE — Progress Notes (Signed)
Labs drawn today for cbc/diff 

## 2013-03-12 ENCOUNTER — Encounter (HOSPITAL_BASED_OUTPATIENT_CLINIC_OR_DEPARTMENT_OTHER): Payer: Medicare Other

## 2013-03-12 DIAGNOSIS — D46C Myelodysplastic syndrome with isolated del(5q) chromosomal abnormality: Secondary | ICD-10-CM

## 2013-03-12 LAB — CBC WITH DIFFERENTIAL/PLATELET
HCT: 33.8 % — ABNORMAL LOW (ref 39.0–52.0)
Hemoglobin: 10.9 g/dL — ABNORMAL LOW (ref 13.0–17.0)
Lymphocytes Relative: 37 % (ref 12–46)
Monocytes Absolute: 0.3 10*3/uL (ref 0.1–1.0)
Monocytes Relative: 9 % (ref 3–12)
Neutro Abs: 1.6 10*3/uL — ABNORMAL LOW (ref 1.7–7.7)
WBC: 3.6 10*3/uL — ABNORMAL LOW (ref 4.0–10.5)

## 2013-03-12 NOTE — Progress Notes (Signed)
Labs drawn today for cbc/diff 

## 2013-03-14 ENCOUNTER — Other Ambulatory Visit (HOSPITAL_COMMUNITY): Payer: Self-pay | Admitting: Oncology

## 2013-03-14 DIAGNOSIS — D46C Myelodysplastic syndrome with isolated del(5q) chromosomal abnormality: Secondary | ICD-10-CM

## 2013-03-14 MED ORDER — LENALIDOMIDE 10 MG PO CAPS: 10.0000 mg | ORAL_CAPSULE | Freq: Every day | ORAL | Status: DC

## 2013-03-19 ENCOUNTER — Other Ambulatory Visit (HOSPITAL_COMMUNITY): Payer: Self-pay | Admitting: Oncology

## 2013-03-19 ENCOUNTER — Encounter (HOSPITAL_BASED_OUTPATIENT_CLINIC_OR_DEPARTMENT_OTHER): Payer: Medicare Other

## 2013-03-19 DIAGNOSIS — D46C Myelodysplastic syndrome with isolated del(5q) chromosomal abnormality: Secondary | ICD-10-CM

## 2013-03-19 LAB — CBC WITH DIFFERENTIAL/PLATELET
HCT: 35.7 % — ABNORMAL LOW (ref 39.0–52.0)
Hemoglobin: 11.9 g/dL — ABNORMAL LOW (ref 13.0–17.0)
Lymphocytes Relative: 22 % (ref 12–46)
Lymphs Abs: 0.8 10*3/uL (ref 0.7–4.0)
MCV: 92 fL (ref 78.0–100.0)
Monocytes Absolute: 0.5 10*3/uL (ref 0.1–1.0)
Monocytes Relative: 14 % — ABNORMAL HIGH (ref 3–12)
Neutro Abs: 2 10*3/uL (ref 1.7–7.7)
WBC: 3.6 10*3/uL — ABNORMAL LOW (ref 4.0–10.5)

## 2013-03-19 MED ORDER — LENALIDOMIDE 10 MG PO CAPS: 10.0000 mg | ORAL_CAPSULE | Freq: Every day | ORAL | Status: DC

## 2013-03-19 NOTE — Progress Notes (Signed)
Labs drawn today for cbc/diff 

## 2013-03-26 ENCOUNTER — Encounter (HOSPITAL_BASED_OUTPATIENT_CLINIC_OR_DEPARTMENT_OTHER): Payer: Medicare Other

## 2013-03-26 DIAGNOSIS — D46C Myelodysplastic syndrome with isolated del(5q) chromosomal abnormality: Secondary | ICD-10-CM

## 2013-03-26 LAB — CBC
HCT: 35.4 % — ABNORMAL LOW (ref 39.0–52.0)
Hemoglobin: 11.6 g/dL — ABNORMAL LOW (ref 13.0–17.0)
MCH: 30.8 pg (ref 26.0–34.0)
MCHC: 32.8 g/dL (ref 30.0–36.0)
MCV: 93.9 fL (ref 78.0–100.0)

## 2013-03-26 LAB — DIFFERENTIAL
Basophils Relative: 1 % (ref 0–1)
Eosinophils Relative: 7 % — ABNORMAL HIGH (ref 0–5)
Monocytes Absolute: 0.5 10*3/uL (ref 0.1–1.0)
Monocytes Relative: 11 % (ref 3–12)
Neutro Abs: 2.6 10*3/uL (ref 1.7–7.7)

## 2013-03-26 NOTE — Progress Notes (Signed)
Labs drawn today for cbc/diff 

## 2013-04-02 ENCOUNTER — Other Ambulatory Visit (HOSPITAL_COMMUNITY): Payer: Medicare Other

## 2013-04-06 ENCOUNTER — Encounter (HOSPITAL_COMMUNITY): Payer: Medicare Other | Attending: Oncology

## 2013-04-06 VITALS — BP 128/59 | HR 62 | Temp 97.2°F | Resp 16

## 2013-04-06 DIAGNOSIS — D46C Myelodysplastic syndrome with isolated del(5q) chromosomal abnormality: Secondary | ICD-10-CM | POA: Insufficient documentation

## 2013-04-06 NOTE — Patient Instructions (Signed)
[facility] Discharge Instructions  RECOMMENDATIONS MADE BY THE CONSULTANT AND ANY TEST RESULTS WILL BE SENT TO YOUR REFERRING PHYSICIAN.  EXAM FINDINGS BY THE PHYSICIAN TODAY AND SIGNS OR SYMPTOMS TO REPORT TO CLINIC OR PRIMARY PHYSICIAN: Exam findings as discussed by Dr. Orlie Dakin.  SPECIAL INSTRUCTIONS/FOLLOW-UP: 1.  We will do labs every 2 weeks as scheduled. 2.  Return in 2 months for an office visit as scheduled, sooner if needed.  Thank you for choosing [facility] to provide your oncology and hematology care.  To afford each patient quality time with our providers, please arrive at least 15 minutes before your scheduled appointment time.  With your help, our goal is to use those 15 minutes to complete the necessary work-up to ensure our physicians have the information they need to help with your evaluation and healthcare recommendations.    Effective January 1st, 2014, we ask that you re-schedule your appointment with our physicians should you arrive 10 or more minutes late for your appointment.  We strive to give you quality time with our providers, and arriving late affects you and other patients whose appointments are after yours.    Again, thank you for choosing [facility].  Our hope is that these requests will decrease the amount of time that you wait before being seen by our physicians.       _____________________________________________________________  Should you have questions after your visit to [facility], please contact our office at [phone] between the hours of 8:30 a.m. and 5:00 p.m.  Voicemails left after 4:30 p.m. will not be returned until the following business day.  For prescription refill requests, have your pharmacy contact our office with your prescription refill request.

## 2013-04-09 NOTE — Progress Notes (Signed)
History of present illness:  Patient returns to clinic today for laboratory work and routine followup. He is now taking 10 mg Revlimid daily without significant side effects.  He currently feels well and is asymptomatic.  He has no neurologic complaints.  He denies any recent fevers or illnesses.  He denies any easy bleeding or bruising.  He has a good appetite and denies weight loss.    He denies any chest pain or shortness of breath.  He has no nausea, vomiting, constipation, or diarrhea.  He has no urinary complaints.  Patient feels at his baseline and offers no specific complaints today.  Active Ambulatory Problems    Diagnosis Date Noted  . DYSLIPIDEMIA 04/11/2009  . CAD 04/11/2009  . Atrial fibrillation 04/11/2009  . PVD 04/11/2009  . OSTEOARTHRITIS 04/11/2009  . CAROTID ARTERY STENOSIS 10/16/2010  . Long term current use of anticoagulant 12/22/2010  . GI bleeding 10/09/2011  . AVM (arteriovenous malformation) of colon without hemorrhage 01/04/2012  . Angina effort 01/06/2012  . Anemia 01/10/2012  . History of colonic polyps 02/28/2012  . Myelodysplastic syndrome with 5 q minus 10/17/2012  . High output heart failure 10/25/2012  . Non-ST elevation MI (NSTEMI) 01/06/2013  . Acute on chronic systolic heart failure 01/08/2013  . Other pancytopenia 01/08/2013   Resolved Ambulatory Problems    Diagnosis Date Noted  . Warfarin-induced coagulopathy 10/09/2011   Past Medical History  Diagnosis Date  . Hypertension   . COPD (chronic obstructive pulmonary disease)   . CAD (coronary artery disease)   . Arthritis   . Dysphagia   . Carotid stenosis   . Diabetes mellitus, type 2   . HLD (hyperlipidemia)   . Hematuria   . LV dysfunction   . Melena   . History of tobacco abuse   . Anxiety   . Shortness of breath   . GERD (gastroesophageal reflux disease)   . Myocardial infarction   . Thrombocytopenia 09/01/2012   As per HPI. Otherwise, 10 point system review was  negative.   Filed Vitals:   04/06/13 1357  BP: 128/59  Pulse: 62  Temp: 97.2 F (36.2 C)  Resp: 16    General: Well-developed, well-nourished, no acute distress. Eyes: anicteric sclera. Lungs: Clear to auscultation bilaterally. Heart: Regular rate and rhythm. No rubs, murmurs, or gallops. Abdomen: Soft, nontender, nondistended. No organomegaly noted, normoactive bowel sounds. Musculoskeletal: No edema, cyanosis, or clubbing. Neuro: Alert, answering all questions appropriately. Cranial nerves grossly intact. Skin: No rashes or petechiae noted. Psych: Normal affect.  I have reviewed all the lab results. There are some abnormalities that are not critical to the patient's health.  Assessment and plan:  1. MDS/5 q. minus: Patient's CBC is essentially unchanged.  Continue Revlimid10 mg daily.  Patient will return to clinic every other week for laboratory work and then in 2 months for further evaluation.  He expressed understanding and was in agreement with this plan. 2. Patient may have his back injections since his ANC is greater than 500 and platelets are above 50.

## 2013-04-10 ENCOUNTER — Other Ambulatory Visit (HOSPITAL_COMMUNITY): Payer: Medicare Other

## 2013-04-11 ENCOUNTER — Encounter (HOSPITAL_BASED_OUTPATIENT_CLINIC_OR_DEPARTMENT_OTHER): Payer: Medicare Other

## 2013-04-11 DIAGNOSIS — D46C Myelodysplastic syndrome with isolated del(5q) chromosomal abnormality: Secondary | ICD-10-CM

## 2013-04-11 LAB — CBC WITH DIFFERENTIAL/PLATELET
Basophils Relative: 1 % (ref 0–1)
Eosinophils Absolute: 0.2 10*3/uL (ref 0.0–0.7)
Hemoglobin: 11.3 g/dL — ABNORMAL LOW (ref 13.0–17.0)
MCH: 30.7 pg (ref 26.0–34.0)
MCHC: 33.3 g/dL (ref 30.0–36.0)
Monocytes Absolute: 0.2 10*3/uL (ref 0.1–1.0)
Monocytes Relative: 8 % (ref 3–12)
Neutrophils Relative %: 49 % (ref 43–77)

## 2013-04-11 NOTE — Progress Notes (Signed)
[name] presented for [employer]. Labs per MD order drawn via Peripheral Line 23 gauge needle inserted in RT AC  Good blood return present. Procedure without incident.  Needle removed intact. Patient tolerated procedure well.

## 2013-04-12 ENCOUNTER — Ambulatory Visit (INDEPENDENT_AMBULATORY_CARE_PROVIDER_SITE_OTHER): Payer: Medicare Other | Admitting: Otolaryngology

## 2013-04-12 ENCOUNTER — Other Ambulatory Visit (HOSPITAL_COMMUNITY): Payer: Medicare Other

## 2013-04-12 DIAGNOSIS — K121 Other forms of stomatitis: Secondary | ICD-10-CM

## 2013-04-12 DIAGNOSIS — H903 Sensorineural hearing loss, bilateral: Secondary | ICD-10-CM

## 2013-04-12 DIAGNOSIS — K1230 Oral mucositis (ulcerative), unspecified: Secondary | ICD-10-CM

## 2013-04-12 DIAGNOSIS — R1312 Dysphagia, oropharyngeal phase: Secondary | ICD-10-CM

## 2013-04-12 DIAGNOSIS — R490 Dysphonia: Secondary | ICD-10-CM

## 2013-04-17 ENCOUNTER — Other Ambulatory Visit (HOSPITAL_COMMUNITY): Payer: Self-pay | Admitting: Oncology

## 2013-04-17 DIAGNOSIS — D46C Myelodysplastic syndrome with isolated del(5q) chromosomal abnormality: Secondary | ICD-10-CM

## 2013-04-17 MED ORDER — LENALIDOMIDE 10 MG PO CAPS: 10.0000 mg | ORAL_CAPSULE | Freq: Every day | ORAL | Status: DC

## 2013-04-23 ENCOUNTER — Other Ambulatory Visit (HOSPITAL_COMMUNITY): Payer: Medicare Other

## 2013-04-25 ENCOUNTER — Encounter (HOSPITAL_BASED_OUTPATIENT_CLINIC_OR_DEPARTMENT_OTHER): Payer: Medicare Other

## 2013-04-25 DIAGNOSIS — D46C Myelodysplastic syndrome with isolated del(5q) chromosomal abnormality: Secondary | ICD-10-CM

## 2013-04-25 LAB — CBC WITH DIFFERENTIAL/PLATELET
Basophils Absolute: 0.1 10*3/uL (ref 0.0–0.1)
Basophils Relative: 2 % — ABNORMAL HIGH (ref 0–1)
Eosinophils Absolute: 0.2 10*3/uL (ref 0.0–0.7)
Eosinophils Relative: 7 % — ABNORMAL HIGH (ref 0–5)
MCH: 30.1 pg (ref 26.0–34.0)
MCV: 93.3 fL (ref 78.0–100.0)
Platelets: 96 10*3/uL — ABNORMAL LOW (ref 150–400)
RDW: 17.5 % — ABNORMAL HIGH (ref 11.5–15.5)
WBC: 3.2 10*3/uL — ABNORMAL LOW (ref 4.0–10.5)

## 2013-04-25 NOTE — Progress Notes (Signed)
Labs drawn today for cbc/diff 

## 2013-05-09 ENCOUNTER — Encounter (HOSPITAL_COMMUNITY): Payer: Medicare Other | Attending: Oncology

## 2013-05-09 DIAGNOSIS — D46C Myelodysplastic syndrome with isolated del(5q) chromosomal abnormality: Secondary | ICD-10-CM | POA: Insufficient documentation

## 2013-05-09 LAB — CBC WITH DIFFERENTIAL/PLATELET
Basophils Absolute: 0.1 10*3/uL (ref 0.0–0.1)
Lymphocytes Relative: 34 % (ref 12–46)
Neutro Abs: 1.4 10*3/uL — ABNORMAL LOW (ref 1.7–7.7)
Platelets: 76 10*3/uL — ABNORMAL LOW (ref 150–400)
RDW: 19.3 % — ABNORMAL HIGH (ref 11.5–15.5)
WBC: 3.2 10*3/uL — ABNORMAL LOW (ref 4.0–10.5)

## 2013-05-09 NOTE — Progress Notes (Signed)
Labs drawn today for cbc/diff 

## 2013-05-14 ENCOUNTER — Encounter (INDEPENDENT_AMBULATORY_CARE_PROVIDER_SITE_OTHER): Payer: Self-pay | Admitting: *Deleted

## 2013-05-15 ENCOUNTER — Other Ambulatory Visit (HOSPITAL_COMMUNITY): Payer: Self-pay | Admitting: Oncology

## 2013-05-15 DIAGNOSIS — D46C Myelodysplastic syndrome with isolated del(5q) chromosomal abnormality: Secondary | ICD-10-CM

## 2013-05-15 MED ORDER — LENALIDOMIDE 10 MG PO CAPS: 10.0000 mg | ORAL_CAPSULE | Freq: Every day | ORAL | Status: DC

## 2013-05-23 ENCOUNTER — Encounter (HOSPITAL_BASED_OUTPATIENT_CLINIC_OR_DEPARTMENT_OTHER): Payer: Medicare Other

## 2013-05-23 DIAGNOSIS — D46C Myelodysplastic syndrome with isolated del(5q) chromosomal abnormality: Secondary | ICD-10-CM

## 2013-05-23 LAB — CBC WITH DIFFERENTIAL/PLATELET
Eosinophils Absolute: 0.3 10*3/uL (ref 0.0–0.7)
Eosinophils Relative: 9 % — ABNORMAL HIGH (ref 0–5)
HCT: 34.6 % — ABNORMAL LOW (ref 39.0–52.0)
Lymphs Abs: 1.2 10*3/uL (ref 0.7–4.0)
MCH: 31.4 pg (ref 26.0–34.0)
MCV: 95.3 fL (ref 78.0–100.0)
Monocytes Absolute: 0.3 10*3/uL (ref 0.1–1.0)
Platelets: 80 10*3/uL — ABNORMAL LOW (ref 150–400)
RBC: 3.63 MIL/uL — ABNORMAL LOW (ref 4.22–5.81)

## 2013-05-23 NOTE — Progress Notes (Signed)
Labs drawn today for cbc/diff 

## 2013-06-06 ENCOUNTER — Encounter (HOSPITAL_COMMUNITY): Payer: Medicare Other | Attending: Oncology

## 2013-06-06 DIAGNOSIS — D46C Myelodysplastic syndrome with isolated del(5q) chromosomal abnormality: Secondary | ICD-10-CM | POA: Insufficient documentation

## 2013-06-06 DIAGNOSIS — D469 Myelodysplastic syndrome, unspecified: Secondary | ICD-10-CM | POA: Insufficient documentation

## 2013-06-06 LAB — CBC WITH DIFFERENTIAL/PLATELET
Basophils Absolute: 0 10*3/uL (ref 0.0–0.1)
Basophils Relative: 1 % (ref 0–1)
Eosinophils Absolute: 0.3 10*3/uL (ref 0.0–0.7)
Eosinophils Relative: 10 % — ABNORMAL HIGH (ref 0–5)
HCT: 35.6 % — ABNORMAL LOW (ref 39.0–52.0)
Lymphocytes Relative: 37 % (ref 12–46)
MCH: 31.4 pg (ref 26.0–34.0)
MCHC: 32.6 g/dL (ref 30.0–36.0)
MCV: 96.2 fL (ref 78.0–100.0)
Monocytes Absolute: 0.4 10*3/uL (ref 0.1–1.0)
Platelets: 81 10*3/uL — ABNORMAL LOW (ref 150–400)
RDW: 19.4 % — ABNORMAL HIGH (ref 11.5–15.5)
WBC: 3.2 10*3/uL — ABNORMAL LOW (ref 4.0–10.5)

## 2013-06-06 NOTE — Progress Notes (Signed)
Labs drawn today for cbc/diff 

## 2013-06-07 ENCOUNTER — Encounter (HOSPITAL_COMMUNITY): Payer: Self-pay

## 2013-06-07 ENCOUNTER — Encounter (HOSPITAL_BASED_OUTPATIENT_CLINIC_OR_DEPARTMENT_OTHER): Payer: Medicare Other

## 2013-06-07 VITALS — BP 133/56 | HR 59 | Temp 97.4°F | Resp 16 | Wt 192.4 lb

## 2013-06-07 DIAGNOSIS — D46C Myelodysplastic syndrome with isolated del(5q) chromosomal abnormality: Secondary | ICD-10-CM

## 2013-06-07 DIAGNOSIS — D469 Myelodysplastic syndrome, unspecified: Secondary | ICD-10-CM

## 2013-06-07 NOTE — Progress Notes (Signed)
[facility] Telephone:([phone]   [phone]  OFFICE PROGRESS NOTE  [name], MD [address] [place] [address]  DIAGNOSIS: Myelodysplastic syndrome with 5 q minus    INTERVAL HISTORY:   Was diagnosed with MDS on 09/05/2012 following a bone marrow, aspiration and biopsy. He was started on Revlimid which was increased to 10 mg in May.  Overall he feels well and has been transfusion independent for quite some time at least since 12/2012. He returns to the clinic today for scheduled follow up today and denies any new problems .  He denies shortness of breath, night sweats or fever.  He has bruises seen his upper extremities mostly in the forearm which is he says is not new.  Otherwise he has no other bruising or bleeding abnormality.  He is eating well denies any recent infection.  He reports some Leg pain which he said they improved significantly on amitriptyline.   I warned him about the potential for excessive sleepiness and increasing the risk of fall.  He does have some mild fatigue which is not new. He states that he has had neuropathic pain for more than one year in his right leg.    REVIEW OF SYSTEMS: 14 point review of system is as in the history above otherwise negative.   PHYSICAL EXAMINATION:  Blood pressure 133/56, pulse 59, temperature 97.4 F (36.3 C), temperature source Oral, resp. rate 16, weight 192 lb 6.4 oz (87.272 kg). GENERAL: No acute distress. SKIN:  No rashes or significant lesions . Ecchymosis in both forearms. HEAD: Normocephalic, No masses, lesions, tenderness or abnormalities  EYES: Conjunctiva are pink and non-injected and no jaundice ENT: External ears normal ,lips, buccal mucosa, and tongue normal and mucous membranes are moist . No evidence of thrush. LYMPH: No palpable lymphadenopathy, in the neck, supraclavicular areas LUNGS: Clear to auscultation , no crackles or wheezes HEART: regular rate &  rhythm, no murmurs, no gallops, S1 normal and S2 normal and no S3. ABDOMEN: Abdomen soft, non-tender, no masses or organomegaly and no hepatosplenomegaly palpable EXTREMITIES: No edema, no skin discoloration or tenderness NEURO: Alert & oriented    LABORATORY DATA: Lab Results  Component Value Date   WBC 3.2* 06/06/2013   HGB 11.6* 06/06/2013   HCT 35.6* 06/06/2013   MCV 96.2 06/06/2013   PLT 81* 06/06/2013      Chemistry      Component Value Date/Time   NA 136 01/08/2013 0513   K 3.5 01/08/2013 0513   CL 96 01/08/2013 0513   CO2 30 01/08/2013 0513   BUN 32* 01/08/2013 0513   CREATININE 1.82* 01/08/2013 0513      Component Value Date/Time   CALCIUM 8.6 01/08/2013 0513   ALKPHOS 74 10/27/2012 0830   AST 19 10/27/2012 0830   ALT 16 10/27/2012 0830   BILITOT 0.9 10/27/2012 0830       RADIOGRAPHIC STUDIES: No results found.   ASSESSMENT:  Mr. Layla Barter is responding well to Revlimid for 5Q minus syndrome MDS.  His counts are stable. At this point I think we can do his CBC less often.   PLAN:  1. Return to clinic in 2 months. 2. Repeat CBC at the next visit.  This recommendations was discussed with Dellis Anes PA-C.  All questions were satisfactorily answered. Patient was instructed to call if  any concern arises, especially for increasing fatigue bruising or bleeding.  I spent more than 50 %  counseling the patient face to face. The total time spent in the appointment was 30 minutes.   Sherral Hammers, MD FACP. Hematology/Oncology.

## 2013-06-07 NOTE — Patient Instructions (Addendum)
[facility] Discharge Instructions  RECOMMENDATIONS MADE BY THE CONSULTANT AND ANY TEST RESULTS WILL BE SENT TO YOUR REFERRING PHYSICIAN.  EXAM FINDINGS BY THE PHYSICIAN TODAY AND SIGNS OR SYMPTOMS TO REPORT TO CLINIC OR PRIMARY PHYSICIAN: You saw Dr Sharia Reeve today.  Your blood counts are stable.  Please continue taking medications as prescribed.  Follow up appointment in 2 months along with lab work at that time.  If you start feeling more weak or more tires than normal please notify your doctor.   Thank you for choosing [facility] to provide your oncology and hematology care.  To afford each patient quality time with our providers, please arrive at least 15 minutes before your scheduled appointment time.  With your help, our goal is to use those 15 minutes to complete the necessary work-up to ensure our physicians have the information they need to help with your evaluation and healthcare recommendations.    Effective January 1st, 2014, we ask that you re-schedule your appointment with our physicians should you arrive 10 or more minutes late for your appointment.  We strive to give you quality time with our providers, and arriving late affects you and other patients whose appointments are after yours.    Again, thank you for choosing [facility].  Our hope is that these requests will decrease the amount of time that you wait before being seen by our physicians.       _____________________________________________________________  Should you have questions after your visit to [facility], please contact our office at [phone] between the hours of 8:30 a.m. and 5:00 p.m.  Voicemails left after 4:30 p.m. will not be returned until the following business day.  For prescription refill requests, have your pharmacy contact our office with your prescription refill request.

## 2013-06-11 ENCOUNTER — Other Ambulatory Visit (HOSPITAL_COMMUNITY): Payer: Self-pay | Admitting: Oncology

## 2013-06-11 DIAGNOSIS — D46C Myelodysplastic syndrome with isolated del(5q) chromosomal abnormality: Secondary | ICD-10-CM

## 2013-06-11 MED ORDER — LENALIDOMIDE 10 MG PO CAPS: 10.0000 mg | ORAL_CAPSULE | Freq: Every day | ORAL | Status: DC

## 2013-06-12 ENCOUNTER — Encounter: Payer: Self-pay | Admitting: Vascular Surgery

## 2013-06-13 ENCOUNTER — Other Ambulatory Visit (INDEPENDENT_AMBULATORY_CARE_PROVIDER_SITE_OTHER): Payer: Medicare Other | Admitting: *Deleted

## 2013-06-13 ENCOUNTER — Ambulatory Visit (INDEPENDENT_AMBULATORY_CARE_PROVIDER_SITE_OTHER): Payer: Medicare Other | Admitting: Vascular Surgery

## 2013-06-13 ENCOUNTER — Encounter: Payer: Self-pay | Admitting: Vascular Surgery

## 2013-06-13 DIAGNOSIS — I6529 Occlusion and stenosis of unspecified carotid artery: Secondary | ICD-10-CM

## 2013-06-13 DIAGNOSIS — Z48812 Encounter for surgical aftercare following surgery on the circulatory system: Secondary | ICD-10-CM

## 2013-06-13 NOTE — Progress Notes (Signed)
[facility]  Patient name: [name] MRN: [id number] DOB: 15-Feb-1932 Sex: male  REASON FOR VISIT: follow up of carotid disease.  HPI: [name] is a 79 y.o. male who I last saw in September of 2013. He has a known 60-79% right carotid stenosis with a less than 40% left carotid stenosis. Since I saw him last he denies any history of stroke, TIAs, expressive or receptive aphasia, or amaurosis fugax. He has had some right lower extremity weakness and has been having some back pain. He is scheduled for nerve conduction studies. Since I saw him last, he did have a carotid duplex scan done at Dr. Juanetta Gosling office in March of this year which did not show a significant progression of his carotid disease. He comes in for a yearly follow up visit. He has had problems with anemia and is receiving frequent transfusions he is on aspirin.  Past Medical History  Diagnosis Date  . Hypertension   . COPD (chronic obstructive pulmonary disease)   . CAD (coronary artery disease)     s/p 4V CABG '96, Myoview '10 w/o ischemia  . Arthritis   . Atrial fibrillation     coumadin stopped 12/2011 2/2 anemia  . Dysphagia   . Anemia     requiring blood transfusions  . Carotid stenosis     Dr Waverly Ferrari follows.   . Diabetes mellitus, type 2     A1C 6.7% 12/2011  . HLD (hyperlipidemia)     LDL 65 12/2011, intolerant to statins  . Hematuria     felt 2/2 foley insertion  . LV dysfunction     EF 45-50% by echo 2007  . Melena     EGD & Colonoscopy 09/2011 revealed gastritis, erosions, scars, diverticula, 8 colon polyps (largest 1.6cm, not removed 2/2 anticoagulation), small AVM in transverse colon  . History of tobacco abuse     quit 1992  . Anxiety   . Shortness of breath     exertion  . GERD (gastroesophageal reflux disease)   . Myocardial infarction   . Thrombocytopenia 09/01/2012  . Myelodysplastic syndrome with 5 q minus 10/17/2012    On Revlimid 5 mg daily  21 days on and 7 days off   Family History  Problem Relation Age of Onset  . Heart disease Sister   . Cancer Sister   . Hyperlipidemia Sister   . Hypertension Sister   . Heart disease Brother     Heart Diseast before age 69  . Coronary artery disease Mother   . Cancer Father     stomach  . Anesthesia problems Neg Hx   . Hypotension Neg Hx   . Malignant hyperthermia Neg Hx   . Pseudochol deficiency Neg Hx    SOCIAL HISTORY: History  Substance Use Topics  . Smoking status: Former Smoker -- 2.00 packs/day for 40 years    Types: Cigarettes    Quit date: 09/27/1990  . Smokeless tobacco: Never Used  . Alcohol Use: No   Allergies  Allergen Reactions  . Statins Other (See Comments)    Causes legs to hurt.   . Tape     Causes skin to peel off.     Current Outpatient Prescriptions  Medication Sig Dispense Refill  . acetaminophen (TYLENOL) 500 MG tablet Take 1 tablet (500 mg total) by mouth every 6 (six) hours as needed. Pain  30 tablet    . albuterol (PROVENTIL HFA;VENTOLIN HFA) 108 (90 BASE) MCG/ACT  inhaler Inhale 2 puffs into the lungs every 6 (six) hours as needed for wheezing.  1 Inhaler  2  . amiodarone (PACERONE) 200 MG tablet Take 0.5 tablets (100 mg total) by mouth daily.  30 tablet  5  . amitriptyline (ELAVIL) 10 MG tablet Take 30 mg by mouth at bedtime.      Marland Kitchen amLODipine (NORVASC) 5 MG tablet TAKE ONE TABLET BY MOUTH EVERY DAY FOR BLOOD PRESSURE  90 tablet  3  . aspirin EC 81 MG tablet Take 81 mg by mouth 2 (two) times daily.       . diazepam (VALIUM) 5 MG tablet Take 5 mg by mouth every 6 (six) hours as needed. Muscles Spasms      . furosemide (LASIX) 40 MG tablet Take 1 tablet (40 mg total) by mouth daily. Take one half tablet daily on a routine basis. If your weight goes up by 3 pounds or if you have edema of your legs take one full tablet twice a day for 3 days  30 tablet  11  . HYDROcodone-acetaminophen (NORCO) 5-325 MG per tablet Take 1 tablet by mouth every 6 (six)  hours as needed. Pain      . lenalidomide (REVLIMID) 10 MG capsule Take 1 capsule (10 mg total) by mouth daily.  28 capsule  0  . metFORMIN (GLUCOPHAGE) 500 MG tablet Take 500 mg by mouth 2 (two) times daily with a meal.      . metoprolol tartrate (LOPRESSOR) 25 MG tablet Take 25 mg by mouth 2 (two) times daily.        . Multiple Vitamin (MULTIVITAMIN) tablet Take 1 tablet by mouth daily.        . nitroGLYCERIN (NITROSTAT) 0.4 MG SL tablet Place 1 tablet (0.4 mg total) under the tongue every 5 (five) minutes x 3 doses as needed for chest pain (up to 3 doses).  25 tablet  3  . pregabalin (LYRICA) 75 MG capsule Take 75 mg by mouth daily.      . Red Yeast Rice 600 MG TABS Take 1 tablet by mouth daily.        . silodosin (RAPAFLO) 8 MG CAPS capsule Take 8 mg by mouth daily with breakfast.         No current facility-administered medications for this visit.   REVIEW OF SYSTEMS: Arly.Keller ] denotes positive finding; [  ] denotes negative finding  CARDIOVASCULAR:  [ ]  chest pain   [ ]  chest pressure   [ ]  palpitations   [ ]  orthopnea   [ ]  dyspnea on exertion   Arly.Keller ] claudication   [ ]  rest pain   [ ]  DVT   [ ]  phlebitis PULMONARY:   [ ]  productive cough   [ ]  asthma   [ ]  wheezing NEUROLOGIC:   [ ]  weakness  [ ]  paresthesias  [ ]  aphasia  [ ]  amaurosis  [ ]  dizziness HEMATOLOGIC:   [ ]  bleeding problems   [ ]  clotting disorders MUSCULOSKELETAL:  [ ]  joint pain   [ ]  joint swelling Arly.Keller ] leg swelling GASTROINTESTINAL: [ ]   blood in stool  [ ]   hematemesis GENITOURINARY:  [ ]   dysuria  [ ]   hematuria PSYCHIATRIC:  [ ]  history of major depression INTEGUMENTARY:  [ ]  rashes  [ ]  ulcers CONSTITUTIONAL:  [ ]  fever   [ ]  chills  PHYSICAL EXAM: Filed Vitals:   06/13/13 1333 06/13/13 1336  BP: 154/70 155/70  Pulse:  57   Height: 5\' 11"  (1.803 m)   Weight: 190 lb 8 oz (86.41 kg)   SpO2: 97%    Body mass index is 26.58 kg/(m^2). GENERAL: The patient is a well-nourished male, in no acute distress. The vital  signs are documented above. CARDIOVASCULAR: There is a regular rate and rhythm. Do not detect carotid bruits. The feet are warm and well-perfused. PULMONARY: There is good air exchange bilaterally without wheezing or rales. ABDOMEN: Soft and non-tender with normal pitched bowel sounds.  MUSCULOSKELETAL: There are no major deformities or cyanosis. NEUROLOGIC: No focal weakness or paresthesias are detected. SKIN: There are no ulcers or rashes noted. PSYCHIATRIC: The patient has a normal affect.  DATA:  I have independently interpreted his carotid duplex scan today which shows a 60-79% right carotid stenosis with no significant stenosis on the left. Both vertebral arteries are patent with normally directed flow.  MEDICAL ISSUES: This patient has asymptomatic 60-79% right carotid stenosis. He understands we would not consider right carotid endarterectomy unless the stenosis progressed to greater than 80% or he developed new neurologic symptoms. He'll have a carotid duplex scan in March at Dr. Juanetta Gosling office, and I'll plan on seeing him back in one year with follow duplex scan here. He knows to call sooner if he has problems. In the meantime he knows to continue taking his aspirin.  DICKSON,CHRISTOPHER S [facility] [place] [facility]: [phone]

## 2013-06-14 ENCOUNTER — Other Ambulatory Visit (HOSPITAL_COMMUNITY): Payer: Self-pay | Admitting: Oncology

## 2013-06-14 DIAGNOSIS — D46C Myelodysplastic syndrome with isolated del(5q) chromosomal abnormality: Secondary | ICD-10-CM

## 2013-06-14 MED ORDER — LENALIDOMIDE 10 MG PO CAPS: 10.0000 mg | ORAL_CAPSULE | Freq: Every day | ORAL | Status: DC

## 2013-06-19 ENCOUNTER — Ambulatory Visit (INDEPENDENT_AMBULATORY_CARE_PROVIDER_SITE_OTHER): Payer: Medicare Other | Admitting: Neurology

## 2013-06-19 ENCOUNTER — Ambulatory Visit (INDEPENDENT_AMBULATORY_CARE_PROVIDER_SITE_OTHER): Payer: Self-pay

## 2013-06-19 DIAGNOSIS — G5600 Carpal tunnel syndrome, unspecified upper limb: Secondary | ICD-10-CM

## 2013-06-19 DIAGNOSIS — Z0289 Encounter for other administrative examinations: Secondary | ICD-10-CM

## 2013-06-19 DIAGNOSIS — M961 Postlaminectomy syndrome, not elsewhere classified: Secondary | ICD-10-CM

## 2013-06-19 DIAGNOSIS — IMO0002 Reserved for concepts with insufficient information to code with codable children: Secondary | ICD-10-CM

## 2013-06-19 DIAGNOSIS — G63 Polyneuropathy in diseases classified elsewhere: Secondary | ICD-10-CM

## 2013-06-19 DIAGNOSIS — G5601 Carpal tunnel syndrome, right upper limb: Secondary | ICD-10-CM

## 2013-06-19 DIAGNOSIS — R209 Unspecified disturbances of skin sensation: Secondary | ICD-10-CM

## 2013-06-19 DIAGNOSIS — G544 Lumbosacral root disorders, not elsewhere classified: Secondary | ICD-10-CM

## 2013-06-19 NOTE — Procedures (Signed)
HISTORY:  [name] is a 79 year old gentleman with a prior history of lumbosacral spine surgery at the L3-4 level in 2007. The patient reports some discomfort in the back and into the right leg with partial flexion of the back. The patient otherwise feels well while sitting or lying down. The patient reports some numbness sensations in the feet. The patient is being evaluated for a possible lumbosacral radiculopathy.  NERVE CONDUCTION STUDIES:  Nerve conduction studies were performed on the right upper extremity. The distal motor latency for the right median nerve was prolonged, with a normal motor amplitude. The distal motor latency and motor amplitudes for the right ulnar nerve was normal. The F wave latencies and nerve conduction velocities for the right median and ulnar nerves were normal. The sensory latency for the right median nerve was prolonged, normal for the right ulnar nerve.  Nerve conduction study was performed on both lower extremities. The distal motor latencies for the peroneal nerves and posterior tibial nerves were normal bilaterally. The motor amplitudes for these nerves were low with exception that the left posterior tibial nerve revealed a normal motor amplitude. The nerve conduction velocities for the peroneal and posterior tibial nerves were normal bilaterally. The peroneal sensory latencies were absent bilaterally, and the H reflex latencies were absent bilaterally.  EMG STUDIES:  EMG study was performed on the right lower extremity:  The tibialis anterior muscle reveals 2 to 4K motor units with full recruitment. No fibrillations or positive waves were seen. The peroneus tertius muscle reveals 2 to 5K motor units with decreased recruitment. No fibrillations or positive waves were seen. The medial gastrocnemius muscle reveals 1 to 3K motor units with full recruitment. No fibrillations or positive waves were seen. The vastus lateralis muscle reveals 2 to 6K motor  units with decreased recruitment. No fibrillations or positive waves were seen. The iliopsoas muscle reveals 2 to 4K motor units with full recruitment. No fibrillations or positive waves were seen. The biceps femoris muscle (long head) reveals 2 to 4K motor units with full recruitment. No fibrillations or positive waves were seen. The lumbosacral paraspinal muscles were tested at 3 levels, and revealed no abnormalities of insertional activity at all 3 levels tested. There was good relaxation.  EMG study was performed on the left lower extremity:  The tibialis anterior muscle reveals 2 to 4K motor units with full recruitment. No fibrillations or positive waves were seen. The peroneus tertius muscle reveals 2 to 4K motor units with full recruitment. No fibrillations or positive waves were seen. The medial gastrocnemius muscle reveals 1 to 3K motor units with full recruitment. No fibrillations or positive waves were seen. The vastus lateralis muscle reveals 2 to 4K motor units with full recruitment. No fibrillations or positive waves were seen. The iliopsoas muscle reveals 2 to 4K motor units with full recruitment. No fibrillations or positive waves were seen. The biceps femoris muscle (long head) reveals 2 to 4K motor units with full recruitment. No fibrillations or positive waves were seen. The lumbosacral paraspinal muscles were tested at 3 levels, and revealed no abnormalities of insertional activity at all 3 levels tested. There was good relaxation.   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 There is evidence of a mild right carpal tunnel syndrome. EMG evaluation of the right lower extremity shows findings consistent with a chronic stable L3 or L4 radiculopathy. No  active denervation is seen. EMG study of the left lower extremity was relatively  normal. No evidence of a lumbosacral radiculopathy is seen on the left.  Marlan Palau MD 06/19/2013 2:14 PM  [facility] [address] [place], [place] [address]  Phone [phone] Fax [phone]

## 2013-07-18 ENCOUNTER — Other Ambulatory Visit (HOSPITAL_COMMUNITY): Payer: Self-pay | Admitting: Oncology

## 2013-07-18 DIAGNOSIS — D46C Myelodysplastic syndrome with isolated del(5q) chromosomal abnormality: Secondary | ICD-10-CM

## 2013-07-18 MED ORDER — LENALIDOMIDE 10 MG PO CAPS: 10.0000 mg | ORAL_CAPSULE | Freq: Every day | ORAL | Status: DC

## 2013-08-06 ENCOUNTER — Encounter (HOSPITAL_COMMUNITY): Payer: Medicare Other | Attending: Oncology

## 2013-08-06 ENCOUNTER — Encounter (HOSPITAL_COMMUNITY): Payer: Self-pay | Admitting: *Deleted

## 2013-08-06 DIAGNOSIS — D46C Myelodysplastic syndrome with isolated del(5q) chromosomal abnormality: Secondary | ICD-10-CM

## 2013-08-06 DIAGNOSIS — D469 Myelodysplastic syndrome, unspecified: Secondary | ICD-10-CM | POA: Insufficient documentation

## 2013-08-06 LAB — CBC WITH DIFFERENTIAL/PLATELET
Basophils Relative: 2 % — ABNORMAL HIGH (ref 0–1)
Eosinophils Absolute: 0.3 10*3/uL (ref 0.0–0.7)
Eosinophils Relative: 11 % — ABNORMAL HIGH (ref 0–5)
HCT: 35.1 % — ABNORMAL LOW (ref 39.0–52.0)
Lymphs Abs: 1.2 10*3/uL (ref 0.7–4.0)
MCH: 31.4 pg (ref 26.0–34.0)
MCHC: 33 g/dL (ref 30.0–36.0)
MCV: 94.9 fL (ref 78.0–100.0)
Monocytes Absolute: 0.3 10*3/uL (ref 0.1–1.0)
Monocytes Relative: 9 % (ref 3–12)
Neutrophils Relative %: 41 % — ABNORMAL LOW (ref 43–77)
Platelets: 72 10*3/uL — ABNORMAL LOW (ref 150–400)
RBC: 3.7 MIL/uL — ABNORMAL LOW (ref 4.22–5.81)

## 2013-08-06 NOTE — Progress Notes (Signed)
Labs drawn today for cbc/diff 

## 2013-08-08 ENCOUNTER — Encounter (HOSPITAL_COMMUNITY): Payer: Self-pay

## 2013-08-08 ENCOUNTER — Encounter (HOSPITAL_BASED_OUTPATIENT_CLINIC_OR_DEPARTMENT_OTHER): Payer: Medicare Other

## 2013-08-08 VITALS — BP 142/61 | HR 54 | Temp 97.2°F | Resp 20 | Wt 194.0 lb

## 2013-08-08 DIAGNOSIS — D46C Myelodysplastic syndrome with isolated del(5q) chromosomal abnormality: Secondary | ICD-10-CM

## 2013-08-08 DIAGNOSIS — D469 Myelodysplastic syndrome, unspecified: Secondary | ICD-10-CM

## 2013-08-08 NOTE — Progress Notes (Signed)
[facility]  OFFICE PROGRESS NOTE  [name], MD [address] [place] [address]  DIAGNOSIS: MDS (myelodysplastic syndrome) - Plan: Comprehensive metabolic panel  CURRENT THERAPY:Revlimid 10 mg po once daily  INTERVAL HISTORY:  [name] 79 y.o. male returns for regular visit for followup of myelodysplastic syndrome with the 5 q. minus abnormality.  [name] is tolerating Revlimid  very well. He is on 10 mg of Revlimid  once daily. Marland Kitchen His counts have been stable not requiring any blood transfusions since April 2014. I personally reviewed and went over laboratory results with the patient. WBC, Hgb, and platelets are all low, but stable.   He denies any skin rash, headaches, dizziness, double vision, fevers, chills, night sweats, nausea, vomiting, diarrhea, constipation, chest pain, heart palpitations, shortness of breath, blood in stool, black tarry stool, urinary pain, urinary burning, urinary frequency, hematuria.  MEDICAL HISTORY: Past Medical History  Diagnosis Date  . Hypertension   . COPD (chronic obstructive pulmonary disease)   . CAD (coronary artery disease)     s/p 4V CABG '96, Myoview '10 w/o ischemia  . Arthritis   . Atrial fibrillation     coumadin stopped 12/2011 2/2 anemia  . Dysphagia   . Anemia     requiring blood transfusions  . Carotid stenosis     Dr Waverly Ferrari follows.   . Diabetes mellitus, type 2     A1C 6.7% 12/2011  . HLD (hyperlipidemia)     LDL 65 12/2011, intolerant to statins  . Hematuria     felt 2/2 foley insertion  . LV dysfunction     EF 45-50% by echo 2007  . Melena     EGD & Colonoscopy 09/2011 revealed gastritis, erosions, scars, diverticula, 8 colon polyps (largest 1.6cm, not removed 2/2 anticoagulation), small AVM in transverse colon  . History of tobacco abuse     quit 1992  . Anxiety   . Shortness of breath     exertion  . GERD (gastroesophageal reflux  disease)   . Myocardial infarction   . Thrombocytopenia 09/01/2012  . Myelodysplastic syndrome with 5 q minus 10/17/2012    On Revlimid 5 mg daily 21 days on and 7 days off    INTERIM HISTORY: has DYSLIPIDEMIA; CAD; Atrial fibrillation; PVD; OSTEOARTHRITIS; CAROTID ARTERY STENOSIS; Long term current use of anticoagulant; GI bleeding; AVM (arteriovenous malformation) of colon without hemorrhage; Angina effort; Anemia; History of colonic polyps; Myelodysplastic syndrome with 5 q minus; High output heart failure; Non-ST elevation MI (NSTEMI); Acute on chronic systolic heart failure; and Other pancytopenia on his problem list.    ALLERGIES:  is allergic to statins and tape.  MEDICATIONS:  Current Outpatient Prescriptions  Medication Sig Dispense Refill  . acetaminophen (TYLENOL) 500 MG tablet Take 1 tablet (500 mg total) by mouth every 6 (six) hours as needed. Pain  30 tablet    . albuterol (PROVENTIL HFA;VENTOLIN HFA) 108 (90 BASE) MCG/ACT inhaler Inhale 2 puffs into the lungs every 6 (six) hours as needed for wheezing.  1 Inhaler  2  . amiodarone (PACERONE) 200 MG tablet Take 0.5 tablets (100 mg total) by mouth daily.  30 tablet  5  . amitriptyline (ELAVIL) 10 MG tablet Take 30 mg by mouth at bedtime.      Marland Kitchen amLODipine (NORVASC) 5 MG tablet TAKE ONE TABLET BY MOUTH EVERY DAY FOR BLOOD PRESSURE  90 tablet  3  . aspirin EC  81 MG tablet Take 81 mg by mouth 2 (two) times daily.       . diazepam (VALIUM) 5 MG tablet Take 5 mg by mouth every 6 (six) hours as needed. Muscles Spasms      . furosemide (LASIX) 40 MG tablet Take 1 tablet (40 mg total) by mouth daily. Take one half tablet daily on a routine basis. If your weight goes up by 3 pounds or if you have edema of your legs take one full tablet twice a day for 3 days  30 tablet  11  . HYDROcodone-acetaminophen (NORCO) 5-325 MG per tablet Take 1 tablet by mouth every 6 (six) hours as needed. Pain      . lenalidomide (REVLIMID) 10 MG capsule Take 1  capsule (10 mg total) by mouth daily.  28 capsule  0  . metFORMIN (GLUCOPHAGE) 500 MG tablet Take 500 mg by mouth 2 (two) times daily with a meal.      . metoprolol tartrate (LOPRESSOR) 25 MG tablet Take 25 mg by mouth 2 (two) times daily.        . Multiple Vitamin (MULTIVITAMIN) tablet Take 1 tablet by mouth daily.        . nitroGLYCERIN (NITROSTAT) 0.4 MG SL tablet Place 1 tablet (0.4 mg total) under the tongue every 5 (five) minutes x 3 doses as needed for chest pain (up to 3 doses).  25 tablet  3  . pregabalin (LYRICA) 75 MG capsule Take 75 mg by mouth daily.      . Red Yeast Rice 600 MG TABS Take 1 tablet by mouth daily.        . silodosin (RAPAFLO) 8 MG CAPS capsule Take 8 mg by mouth daily with breakfast.         No current facility-administered medications for this visit.    SURGICAL HISTORY:  Past Surgical History  Procedure Laterality Date  . Knee surgery  1960    Right knee cartilage  . Rotator cuff repair  1990    right   . Inguinal hernia repair  2000 and 2010    left  . Cholecystectomy  05/2010    Lap chole with biologic mesh repair/reinforcement by  Dr Zachery Dakins  . Back surgery  02/2006    ruptured disk,  post op diskitis infection requiring prolonged hospital stay and  surgical I & D  . Tonsillectomy    . Esophagogastroduodenoscopy  10/08/2011    Procedure: ESOPHAGOGASTRODUODENOSCOPY (EGD);  Surgeon: Malissa Hippo, MD;  Location: [facility];  Service: Endoscopy;  Laterality: N/A;  . Colonoscopy  10/08/2011    Procedure: COLONOSCOPY;  Surgeon: Malissa Hippo, MD;  Location: [facility];  Service: Endoscopy;  Laterality: N/A;  . Cataract extraction, bilateral    . Peg placement  07/2006    placed due to prolonged infection, poor po intake, malnutrition during several week hospital stay resulting from ruptured disc that became infected following surgery  . Colonoscopy  01/14/2012    Procedure: COLONOSCOPY;  Surgeon: Malissa Hippo, MD;  Location: [facility];   Service: Endoscopy;  Laterality: N/A;  945  . Coronary artery bypass graft  1996    4 by-pass  . Ventral hernia repair    . Colonoscopy  04/20/2012    Procedure: COLONOSCOPY;  Surgeon: Malissa Hippo, MD;  Location: [facility];  Service: Endoscopy;  Laterality: N/A;  1030  . Pr vein bypass graft,aorto-fem-pop  1996  . Spine surgery    .  Joint replacement  1960    Right Knee  . Lymph node biopsy  08/15/2012    Procedure: LYMPH NODE BIOPSY;  Surgeon: Marlane Hatcher, MD;  Location: [facility];  Service: General;  Laterality: Left;  Cervical Lymph Node Bx in Minor Room  . Bone marrow biopsy    . Bone marrow aspiration      FAMILY HISTORY: family history includes Cancer in his father and sister; Coronary artery disease in his mother; Heart disease in his brother and sister; Hyperlipidemia in his sister; Hypertension in his sister. There is no history of Anesthesia problems, Hypotension, Malignant hyperthermia, or Pseudochol deficiency.  SOCIAL HISTORY:  reports that he quit smoking about 22 years ago. His smoking use included Cigarettes. He has a 80 pack-year smoking history. He has never used smokeless tobacco. He reports that he does not drink alcohol or use illicit drugs.  REVIEW OF SYSTEMS:  As mentioned in the history of present illness  PHYSICAL EXAMINATION: ECOG PERFORMANCE STATUS: 1 - Symptomatic but completely ambulatory  Blood pressure 142/61, pulse 54, temperature 97.2 F (36.2 C), temperature source Oral, resp. rate 20, weight 194 lb (87.998 kg).  GENERAL:alert, no distress, well nourished and well developed SKIN: no rashes or significant lesions HEAD: Normocephalic EYES: PERRLA, EOMI, Conjunctiva are pink and non-injected, sclera clear EARS: External ears normal OROPHARYNX:no erythema, lips, buccal mucosa, and tongue normal and mucous membranes are moist  NECK: supple, no adenopathy, no JVD, no stridor, non-tender LYMPH:  no palpable lymphadenopathy, no  hepatosplenomegaly BREAST:breasts appear normal, no suspicious masses, no skin or nipple changes or axillary nodes LUNGS: clear to auscultation , coarse sounds heard HEART: regular rate & rhythm ABDOMEN:abdomen soft, obese and normal bowel sounds BACK: Back symmetric, no curvature. EXTREMITIES:no edema, no clubbing and no cyanosis  NEURO: alert & oriented x 3 with fluent speech, no focal motor/sensory deficits, gait normal   LABORATORY DATA: Infusion on 08/06/2013  Component Date Value Range Status  . WBC 08/06/2013 3.1* 4.0 - 10.5 K/uL Final  . RBC 08/06/2013 3.70* 4.22 - 5.81 MIL/uL Final  . Hemoglobin 08/06/2013 11.6* 13.0 - 17.0 g/dL Final  . HCT 78/46/9629 35.1* 39.0 - 52.0 % Final  . MCV 08/06/2013 94.9  78.0 - 100.0 fL Final  . MCH 08/06/2013 31.4  26.0 - 34.0 pg Final  . MCHC 08/06/2013 33.0  30.0 - 36.0 g/dL Final  . RDW 52/84/1324 16.5* 11.5 - 15.5 % Final  . Platelets 08/06/2013 72* 150 - 400 K/uL Final  . Neutrophils Relative % 08/06/2013 41* 43 - 77 % Final  . Neutro Abs 08/06/2013 1.3* 1.7 - 7.7 K/uL Final  . Lymphocytes Relative 08/06/2013 38  12 - 46 % Final  . Lymphs Abs 08/06/2013 1.2  0.7 - 4.0 K/uL Final  . Monocytes Relative 08/06/2013 9  3 - 12 % Final  . Monocytes Absolute 08/06/2013 0.3  0.1 - 1.0 K/uL Final  . Eosinophils Relative 08/06/2013 11* 0 - 5 % Final  . Eosinophils Absolute 08/06/2013 0.3  0.0 - 0.7 K/uL Final  . Basophils Relative 08/06/2013 2* 0 - 1 % Final  . Basophils Absolute 08/06/2013 0.1  0.0 - 0.1 K/uL Final     Urinalysis    Component Value Date/Time   COLORURINE YELLOW 01/27/2012 1255   APPEARANCEUR CLEAR 01/27/2012 1255   LABSPEC 1.020 01/27/2012 1255   PHURINE 6.0 01/27/2012 1255   HGBUR NEGATIVE 01/27/2012 1255   BILIRUBINUR NEGATIVE 01/27/2012 1255   KETONESUR NEGATIVE 01/27/2012 1255  UROBILINOGEN 0.2 01/27/2012 1255   NITRITE NEGATIVE 01/27/2012 1255   LEUKOCYTESUR NEGATIVE 01/27/2012 1255     ASSESSMENT: 1. MDS with 5 q- syndrome.  On Revlimid 10 mg po daily. Continue Revlimid monitor CBC and a CMP prior to next visit 2. Pancytopenia secondary to #1 stable  counts  Patient Active Problem List    Diagnosis  Date Noted   .  Acute on chronic systolic heart failure  01/08/2013   .  Other pancytopenia  01/08/2013   .  Non-ST elevation MI (NSTEMI)  01/06/2013   .  High output heart failure  10/25/2012   .  Myelodysplastic syndrome with 5 q minus  10/17/2012   .  History of colonic polyps  02/28/2012   .  Anemia  01/10/2012   .  Angina effort  01/06/2012   .  AVM (arteriovenous malformation) of colon without hemorrhage  01/04/2012   .  GI bleeding  10/09/2011   .  Long term current use of anticoagulant  12/22/2010   .  CAROTID ARTERY STENOSIS  10/16/2010   .  DYSLIPIDEMIA  04/11/2009   .  CAD  04/11/2009   .  Atrial fibrillation  04/11/2009   .  PVD  04/11/2009   .  OSTEOARTHRITIS  04/11/2009    PLAN:  1. I personally reviewed and went over laboratory results with the patient.  2. Revlimid to 10 mg daily  3.  New Revlimid Rx written and provided to [facility].  4. Return for follow-up in 6 weeks.   All questions were answered. The patient knows to call the clinic with any problems, questions or concerns. We can certainly see the patient much sooner if necessary.   I spent57minutes counseling the patient face to face. The total time spent in the appointment was 25 minutes   Annamarie Dawley, MD 08/08/2013 5:04 PM

## 2013-08-10 ENCOUNTER — Other Ambulatory Visit (HOSPITAL_COMMUNITY): Payer: Self-pay | Admitting: Oncology

## 2013-08-10 DIAGNOSIS — D46C Myelodysplastic syndrome with isolated del(5q) chromosomal abnormality: Secondary | ICD-10-CM

## 2013-08-10 MED ORDER — LENALIDOMIDE 10 MG PO CAPS: 10.0000 mg | ORAL_CAPSULE | Freq: Every day | ORAL | Status: DC

## 2013-09-04 ENCOUNTER — Ambulatory Visit (INDEPENDENT_AMBULATORY_CARE_PROVIDER_SITE_OTHER): Payer: Medicare Other | Admitting: Internal Medicine

## 2013-09-10 ENCOUNTER — Other Ambulatory Visit (HOSPITAL_COMMUNITY): Payer: Self-pay | Admitting: Oncology

## 2013-09-10 DIAGNOSIS — D46C Myelodysplastic syndrome with isolated del(5q) chromosomal abnormality: Secondary | ICD-10-CM

## 2013-09-10 MED ORDER — LENALIDOMIDE 10 MG PO CAPS: 10.0000 mg | ORAL_CAPSULE | Freq: Every day | ORAL | Status: DC

## 2013-10-04 ENCOUNTER — Other Ambulatory Visit (HOSPITAL_COMMUNITY): Payer: Self-pay | Admitting: Oncology

## 2013-10-04 ENCOUNTER — Encounter (HOSPITAL_COMMUNITY): Payer: Medicare Other | Attending: Oncology

## 2013-10-04 DIAGNOSIS — D46C Myelodysplastic syndrome with isolated del(5q) chromosomal abnormality: Secondary | ICD-10-CM

## 2013-10-04 DIAGNOSIS — D469 Myelodysplastic syndrome, unspecified: Secondary | ICD-10-CM | POA: Insufficient documentation

## 2013-10-04 LAB — CBC WITH DIFFERENTIAL/PLATELET
BASOS ABS: 0.1 10*3/uL (ref 0.0–0.1)
Basophils Relative: 4 % — ABNORMAL HIGH (ref 0–1)
EOS PCT: 11 % — AB (ref 0–5)
Eosinophils Absolute: 0.4 10*3/uL (ref 0.0–0.7)
HEMATOCRIT: 35.3 % — AB (ref 39.0–52.0)
HEMOGLOBIN: 11.6 g/dL — AB (ref 13.0–17.0)
LYMPHS PCT: 40 % (ref 12–46)
Lymphs Abs: 1.4 10*3/uL (ref 0.7–4.0)
MCH: 30.9 pg (ref 26.0–34.0)
MCHC: 32.9 g/dL (ref 30.0–36.0)
MCV: 93.9 fL (ref 78.0–100.0)
MONO ABS: 0.3 10*3/uL (ref 0.1–1.0)
MONOS PCT: 8 % (ref 3–12)
NEUTROS ABS: 1.3 10*3/uL — AB (ref 1.7–7.7)
Neutrophils Relative %: 37 % — ABNORMAL LOW (ref 43–77)
Platelets: 86 10*3/uL — ABNORMAL LOW (ref 150–400)
RBC: 3.76 MIL/uL — ABNORMAL LOW (ref 4.22–5.81)
RDW: 18.2 % — AB (ref 11.5–15.5)
WBC: 3.5 10*3/uL — ABNORMAL LOW (ref 4.0–10.5)

## 2013-10-04 LAB — COMPREHENSIVE METABOLIC PANEL
ALK PHOS: 70 U/L (ref 39–117)
ALT: 10 U/L (ref 0–53)
AST: 16 U/L (ref 0–37)
Albumin: 3.7 g/dL (ref 3.5–5.2)
BUN: 17 mg/dL (ref 6–23)
CALCIUM: 9.1 mg/dL (ref 8.4–10.5)
CO2: 25 mEq/L (ref 19–32)
CREATININE: 1.43 mg/dL — AB (ref 0.50–1.35)
Chloride: 101 mEq/L (ref 96–112)
GFR calc non Af Amer: 45 mL/min — ABNORMAL LOW (ref >90)
GFR, EST AFRICAN AMERICAN: 52 mL/min — AB (ref >90)
GLUCOSE: 192 mg/dL — AB (ref 70–99)
Potassium: 3.5 mEq/L — ABNORMAL LOW (ref 3.7–5.3)
Sodium: 141 mEq/L (ref 137–147)
TOTAL PROTEIN: 7.7 g/dL (ref 6.0–8.3)
Total Bilirubin: 0.8 mg/dL (ref 0.3–1.2)

## 2013-10-04 LAB — LACTATE DEHYDROGENASE: LDH: 130 U/L (ref 94–250)

## 2013-10-04 LAB — RETICULOCYTES
RBC.: 3.76 MIL/uL — AB (ref 4.22–5.81)
RETIC CT PCT: 2.1 % (ref 0.4–3.1)
Retic Count, Absolute: 79 10*3/uL (ref 19.0–186.0)

## 2013-10-04 NOTE — Progress Notes (Signed)
Labs drawn today for cbc/diff,cmp,ldh,retic

## 2013-10-08 ENCOUNTER — Encounter (HOSPITAL_COMMUNITY): Payer: Self-pay

## 2013-10-08 ENCOUNTER — Encounter (HOSPITAL_BASED_OUTPATIENT_CLINIC_OR_DEPARTMENT_OTHER): Payer: Medicare Other

## 2013-10-08 VITALS — BP 133/53 | HR 58 | Temp 97.2°F | Resp 18 | Wt 197.2 lb

## 2013-10-08 DIAGNOSIS — D649 Anemia, unspecified: Secondary | ICD-10-CM

## 2013-10-08 DIAGNOSIS — G589 Mononeuropathy, unspecified: Secondary | ICD-10-CM

## 2013-10-08 DIAGNOSIS — D46C Myelodysplastic syndrome with isolated del(5q) chromosomal abnormality: Secondary | ICD-10-CM

## 2013-10-08 MED ORDER — LENALIDOMIDE 10 MG PO CAPS: 10.0000 mg | ORAL_CAPSULE | Freq: Every day | ORAL | Status: DC

## 2013-10-08 NOTE — Progress Notes (Signed)
[facility] Discharge Instructions  RECOMMENDATIONS MADE BY THE CONSULTANT AND ANY TEST RESULTS WILL BE SENT TO YOUR REFERRING PHYSICIAN.  You seen Dr Barnet Glasgow today  MEDICATIONS PRESCRIBED: take same medications as prescribed  INSTRUCTIONS GIVEN AND DISCUSSED: followup in 6 weeks with doctor and have labs drawn    Thank you for choosing [facility] to provide your oncology and hematology care.  To afford each patient quality time with our providers, please arrive at least 15 minutes before your scheduled appointment time.  With your help, our goal is to use those 15 minutes to complete the necessary work-up to ensure our physicians have the information they need to help with your evaluation and healthcare recommendations.    Effective January 1st, 2014, we ask that you re-schedule your appointment with our physicians should you arrive 10 or more minutes late for your appointment.  We strive to give you quality time with our providers, and arriving late affects you and other patients whose appointments are after yours.    Again, thank you for choosing [facility].  Our hope is that these requests will decrease the amount of time that you wait before being seen by our physicians.       _____________________________________________________________  Should you have questions after your visit to [facility], please contact our office at [phone] between the hours of 8:30 a.m. and 5:00 p.m.  Voicemails left after 4:30 p.m. will not be returned until the following business day.  For prescription refill requests, have your pharmacy contact our office with your prescription refill request.

## 2013-10-08 NOTE — Progress Notes (Signed)
[facility]  OFFICE PROGRESS NOTE  [name], MD [place] [id number]  DIAGNOSIS: Myelodysplastic syndrome with 5 q minus - Plan: CBC with Differential, Reticulocytes, Lactate dehydrogenase  Anemia  Chief Complaint  Patient presents with  . Myelodysplastic syndrome    Revlimid 10 mg daily continuously    CURRENT THERAPY: Revlimid 10 mg daily  INTERVAL HISTORY: [name] 80 y.o. male returns for followup of myelodysplastic syndrome with 5q- mutation, taking Revlimid 10 mg daily with no need for platelet or red cell transfusion since April of 2014. Treatment was initiated on 01/08/2013.  He continues to have problems with lower 70 paresthesias affecting his gait. He has no difficulty in upper extremity sensory or motor function. Appetite is good with no nausea or vomiting. Bowel movements are regular with no melena, hematocrit GU, hematuria, with some intermittent lower extremity swelling which is relieved by intermittent use of diuretics. He denies any fever, night sweats, abdominal pain, joint pain, headache, or seizures.  MEDICAL HISTORY: Past Medical History  Diagnosis Date  . Hypertension   . COPD (chronic obstructive pulmonary disease)   . CAD (coronary artery disease)     s/p 4V CABG '96, Myoview '10 w/o ischemia  . Arthritis   . Atrial fibrillation     coumadin stopped 12/2011 2/2 anemia  . Dysphagia   . Anemia     requiring blood transfusions  . Carotid stenosis     Dr Deitra Mayo follows.   . Diabetes mellitus, type 2     A1C 6.7% 12/2011  . HLD (hyperlipidemia)     LDL 65 12/2011, intolerant to statins  . Hematuria     felt 2/2 foley insertion  . LV dysfunction     EF 45-50% by echo 2007  . Melena     EGD & Colonoscopy 09/2011 revealed gastritis, erosions, scars, diverticula, 8 colon polyps (largest 1.6cm, not removed 2/2 anticoagulation), small AVM in transverse colon   . History of tobacco abuse     quit 1992  . Anxiety   . Shortness of breath     exertion  . GERD (gastroesophageal reflux disease)   . Myocardial infarction   . Thrombocytopenia 09/01/2012  . Myelodysplastic syndrome with 5 q minus 10/17/2012    On Revlimid 5 mg daily 21 days on and 7 days off    INTERIM HISTORY: has DYSLIPIDEMIA; CAD; Atrial fibrillation; PVD; OSTEOARTHRITIS; CAROTID ARTERY STENOSIS; Long term current use of anticoagulant; GI bleeding; AVM (arteriovenous malformation) of colon without hemorrhage; Angina effort; Anemia; History of colonic polyps; Myelodysplastic syndrome with 5 q minus; High output heart failure; Non-ST elevation MI (NSTEMI); Acute on chronic systolic heart failure; and Other pancytopenia on his problem list.   Revlimid 10 mg daily since 01/08/2013. ALLERGIES:  is allergic to statins and tape.  MEDICATIONS: has a current medication list which includes the following prescription(s): acetaminophen, albuterol, amiodarone, amitriptyline, amlodipine, aspirin ec, diazepam, furosemide, hydrocodone-acetaminophen, lenalidomide, metformin, metoprolol tartrate, multivitamin, nitroglycerin, pregabalin, red yeast rice, and silodosin.  SURGICAL HISTORY:  Past Surgical History  Procedure Laterality Date  . Knee surgery  1960    Right knee cartilage  . Rotator cuff repair  1990    right   . Inguinal hernia repair  2000 and 2010    left  . Cholecystectomy  05/2010    Lap chole with biologic mesh repair/reinforcement by  Dr Rise Patience  . Back surgery  02/2006    ruptured disk,  post op diskitis infection requiring prolonged hospital stay and  surgical I & D  . Tonsillectomy    . Esophagogastroduodenoscopy  10/08/2011    Procedure: ESOPHAGOGASTRODUODENOSCOPY (EGD);  Surgeon: [name], MD;  Location[facility];  Service: Endoscopy;  Laterality: N/A;  . Colonoscopy  10/08/2011    Procedure: COLONOSCOPY;  Surgeon: [name], MD;  Location: [facility];   Service: Endoscopy;  Laterality: N/A;  . Cataract extraction, bilateral    . Peg placement  07/2006    placed due to prolonged infection, poor po intake, malnutrition during several week hospital stay resulting from ruptured disc that became infected following surgery  . Colonoscopy  01/14/2012    Procedure: COLONOSCOPY;  Surgeon: [name], MD;  Location: [facility];  Service: Endoscopy;  Laterality: N/A;  945  . Coronary artery bypass graft  1996    4 by-pass  . Ventral hernia repair    . Colonoscopy  04/20/2012    Procedure: COLONOSCOPY;  Surgeon: [name], MD;  Location: [facility];  Service: Endoscopy;  Laterality: N/A;  1030  . Pr vein bypass graft,aorto-fem-pop  1996  . Spine surgery    . Joint replacement  1960    Right Knee  . Lymph node biopsy  08/15/2012    Procedure: LYMPH NODE BIOPSY;  Surgeon: Scherry Ran, MD;  Location: [facility] ORS;  Service: General;  Laterality: Left;  Cervical Lymph Node Bx in Minor Room  . Bone marrow biopsy    . Bone marrow aspiration      FAMILY HISTORY: family history includes Cancer in his father and sister; Coronary artery disease in his mother; Heart disease in his brother and sister; Hyperlipidemia in his sister; Hypertension in his sister. There is no history of Anesthesia problems, Hypotension, Malignant hyperthermia, or Pseudochol deficiency.  SOCIAL HISTORY:  reports that he quit smoking about 23 years ago. His smoking use included Cigarettes. He has a 80 pack-year smoking history. He has never used smokeless tobacco. He reports that he does not drink alcohol or use illicit drugs.  REVIEW OF SYSTEMS:  Other than that discussed above is noncontributory.  PHYSICAL EXAMINATION: ECOG PERFORMANCE STATUS: 1 - Symptomatic but completely ambulatory  There were no vitals taken for this visit.  GENERAL:alert, no distress and comfortable SKIN: skin color, texture, turgor are normal, no rashes or significant lesions EYES: PERLA;  Conjunctiva are pink and non-injected, sclera clear OROPHARYNX:no exudate, no erythema on lips, buccal mucosa, or tongue. NECK: supple, thyroid normal size, non-tender, without nodularity. No masses CHEST: Increased AP diameter with no breast masses. LYMPH:  no palpable lymphadenopathy in the cervical, axillary or inguinal LUNGS: clear to auscultation and percussion with normal breathing effort HEART: regular rate & rhythm and no murmurs. ABDOMEN:abdomen soft, non-tender and normal bowel sounds MUSCULOSKELETAL:no cyanosis of digits and no clubbing. Range of motion normal.  NEURO: alert & oriented x 3 with fluent speech, no focal motor/sensory deficits. Decreased DTRs bilateral lower extremities.   LABORATORY DATA: Infusion on 10/04/2013  Component Date Value Range Status  . WBC 10/04/2013 3.5* 4.0 - 10.5 K/uL Final  . RBC 10/04/2013 3.76* 4.22 - 5.81 MIL/uL Final  . Hemoglobin 10/04/2013 11.6* 13.0 - 17.0 g/dL Final  . HCT 10/04/2013 35.3* 39.0 - 52.0 % Final  . MCV 10/04/2013 93.9  78.0 - 100.0 fL Final  . MCH 10/04/2013 30.9  26.0 - 34.0 pg Final  . MCHC  10/04/2013 32.9  30.0 - 36.0 g/dL Final  . RDW 10/04/2013 18.2* 11.5 - 15.5 % Final  . Platelets 10/04/2013 86* 150 - 400 K/uL Final   Comment: PLATELET COUNT CONFIRMED BY SMEAR                          PLATELETS APPEAR DECREASED  . Neutrophils Relative % 10/04/2013 37* 43 - 77 % Final  . Neutro Abs 10/04/2013 1.3* 1.7 - 7.7 K/uL Final  . Lymphocytes Relative 10/04/2013 40  12 - 46 % Final  . Lymphs Abs 10/04/2013 1.4  0.7 - 4.0 K/uL Final  . Monocytes Relative 10/04/2013 8  3 - 12 % Final  . Monocytes Absolute 10/04/2013 0.3  0.1 - 1.0 K/uL Final  . Eosinophils Relative 10/04/2013 11* 0 - 5 % Final  . Eosinophils Absolute 10/04/2013 0.4  0.0 - 0.7 K/uL Final  . Basophils Relative 10/04/2013 4* 0 - 1 % Final  . Basophils Absolute 10/04/2013 0.1  0.0 - 0.1 K/uL Final  . LDH 10/04/2013 130  94 - 250 U/L Final  . Retic Ct Pct  10/04/2013 2.1  0.4 - 3.1 % Final  . RBC. 10/04/2013 3.76* 4.22 - 5.81 MIL/uL Final  . Retic Count, Manual 10/04/2013 79.0  19.0 - 186.0 K/uL Final  . Sodium 10/04/2013 141  137 - 147 mEq/L Final  . Potassium 10/04/2013 3.5* 3.7 - 5.3 mEq/L Final  . Chloride 10/04/2013 101  96 - 112 mEq/L Final  . CO2 10/04/2013 25  19 - 32 mEq/L Final  . Glucose, Bld 10/04/2013 192* 70 - 99 mg/dL Final  . BUN 10/04/2013 17  6 - 23 mg/dL Final  . Creatinine, Ser 10/04/2013 1.43* 0.50 - 1.35 mg/dL Final  . Calcium 10/04/2013 9.1  8.4 - 10.5 mg/dL Final  . Total Protein 10/04/2013 7.7  6.0 - 8.3 g/dL Final  . Albumin 10/04/2013 3.7  3.5 - 5.2 g/dL Final  . AST 10/04/2013 16  0 - 37 U/L Final  . ALT 10/04/2013 10  0 - 53 U/L Final  . Alkaline Phosphatase 10/04/2013 70  39 - 117 U/L Final  . Total Bilirubin 10/04/2013 0.8  0.3 - 1.2 mg/dL Final  . GFR calc non Af Amer 10/04/2013 45* >90 mL/min Final  . GFR calc Af Amer 10/04/2013 52* >90 mL/min Final   Comment: (NOTE)                          The eGFR has been calculated using the CKD EPI equation.                          This calculation has not been validated in all clinical situations.                          eGFR's persistently <90 mL/min signify possible Chronic Kidney                          Disease.    PATHOLOGY: No new pathology.  Urinalysis    Component Value Date/Time   COLORURINE YELLOW 01/27/2012 [place] 01/27/2012 1255   LABSPEC 1.020 01/27/2012 1255   PHURINE 6.0 01/27/2012 1255   [place] 01/27/2012 [place] 01/27/2012 [place] 01/27/2012 1255  UROBILINOGEN 0.2 01/27/2012 1255   NITRITE NEGATIVE 01/27/2012 1255   LEUKOCYTESUR NEGATIVE 01/27/2012 1255    RADIOGRAPHIC STUDIES: No results found.  ASSESSMENT:  #1. Myelodysplastic syndrome with 5q- mutation, tolerating Revlimid well. #2. Lower extremity neuropathy, long-standing.   PLAN:  #1. Continue Revlimid 10 mg daily. #2.  Followup in 6 weeks.   All questions were answered. The patient knows to call the clinic with any problems, questions or concerns. We can certainly see the patient much sooner if necessary.   I spent 25 minutes counseling the patient face to face. The total time spent in the appointment was 30 minutes.    Doroteo Bradford, MD 10/08/2013 9:13 AM

## 2013-11-07 ENCOUNTER — Other Ambulatory Visit (HOSPITAL_COMMUNITY): Payer: Self-pay | Admitting: Oncology

## 2013-11-07 DIAGNOSIS — D46C Myelodysplastic syndrome with isolated del(5q) chromosomal abnormality: Secondary | ICD-10-CM

## 2013-11-07 MED ORDER — LENALIDOMIDE 10 MG PO CAPS: 10.0000 mg | ORAL_CAPSULE | Freq: Every day | ORAL | Status: DC

## 2013-11-19 ENCOUNTER — Other Ambulatory Visit: Payer: Self-pay | Admitting: Vascular Surgery

## 2013-11-19 ENCOUNTER — Ambulatory Visit (HOSPITAL_COMMUNITY): Payer: Medicare Other

## 2013-11-19 DIAGNOSIS — Z48812 Encounter for surgical aftercare following surgery on the circulatory system: Secondary | ICD-10-CM

## 2013-11-19 DIAGNOSIS — I6529 Occlusion and stenosis of unspecified carotid artery: Secondary | ICD-10-CM

## 2013-11-25 DIAGNOSIS — J189 Pneumonia, unspecified organism: Secondary | ICD-10-CM

## 2013-11-25 HISTORY — DX: Pneumonia, unspecified organism: J18.9

## 2013-11-27 ENCOUNTER — Emergency Department (HOSPITAL_COMMUNITY): Payer: Medicare Other

## 2013-11-27 ENCOUNTER — Encounter (HOSPITAL_COMMUNITY): Payer: Self-pay | Admitting: Emergency Medicine

## 2013-11-27 ENCOUNTER — Inpatient Hospital Stay (HOSPITAL_COMMUNITY)
Admission: EM | Admit: 2013-11-27 | Discharge: 2013-12-08 | DRG: 853 | Disposition: A | Discharge status: Skilled Nursing Facility | Payer: Medicare Other | Attending: Pulmonary Disease | Admitting: Pulmonary Disease

## 2013-11-27 DIAGNOSIS — N183 Chronic kidney disease, stage 3 unspecified: Secondary | ICD-10-CM | POA: Diagnosis present

## 2013-11-27 DIAGNOSIS — D649 Anemia, unspecified: Secondary | ICD-10-CM

## 2013-11-27 DIAGNOSIS — A419 Sepsis, unspecified organism: Principal | ICD-10-CM | POA: Diagnosis present

## 2013-11-27 DIAGNOSIS — K219 Gastro-esophageal reflux disease without esophagitis: Secondary | ICD-10-CM | POA: Diagnosis present

## 2013-11-27 DIAGNOSIS — I129 Hypertensive chronic kidney disease with stage 1 through stage 4 chronic kidney disease, or unspecified chronic kidney disease: Secondary | ICD-10-CM | POA: Diagnosis present

## 2013-11-27 DIAGNOSIS — Z96659 Presence of unspecified artificial knee joint: Secondary | ICD-10-CM

## 2013-11-27 DIAGNOSIS — J189 Pneumonia, unspecified organism: Secondary | ICD-10-CM | POA: Diagnosis present

## 2013-11-27 DIAGNOSIS — I4891 Unspecified atrial fibrillation: Secondary | ICD-10-CM | POA: Diagnosis present

## 2013-11-27 DIAGNOSIS — J4489 Other specified chronic obstructive pulmonary disease: Secondary | ICD-10-CM | POA: Diagnosis present

## 2013-11-27 DIAGNOSIS — Z7982 Long term (current) use of aspirin: Secondary | ICD-10-CM

## 2013-11-27 DIAGNOSIS — D469 Myelodysplastic syndrome, unspecified: Secondary | ICD-10-CM | POA: Diagnosis present

## 2013-11-27 DIAGNOSIS — E1129 Type 2 diabetes mellitus with other diabetic kidney complication: Secondary | ICD-10-CM | POA: Diagnosis present

## 2013-11-27 DIAGNOSIS — Z8249 Family history of ischemic heart disease and other diseases of the circulatory system: Secondary | ICD-10-CM

## 2013-11-27 DIAGNOSIS — Z951 Presence of aortocoronary bypass graft: Secondary | ICD-10-CM

## 2013-11-27 DIAGNOSIS — I5023 Acute on chronic systolic (congestive) heart failure: Secondary | ICD-10-CM | POA: Diagnosis present

## 2013-11-27 DIAGNOSIS — I428 Other cardiomyopathies: Secondary | ICD-10-CM | POA: Diagnosis present

## 2013-11-27 DIAGNOSIS — E782 Mixed hyperlipidemia: Secondary | ICD-10-CM | POA: Diagnosis present

## 2013-11-27 DIAGNOSIS — D709 Neutropenia, unspecified: Secondary | ICD-10-CM

## 2013-11-27 DIAGNOSIS — E876 Hypokalemia: Secondary | ICD-10-CM | POA: Diagnosis present

## 2013-11-27 DIAGNOSIS — I509 Heart failure, unspecified: Secondary | ICD-10-CM | POA: Diagnosis present

## 2013-11-27 DIAGNOSIS — M129 Arthropathy, unspecified: Secondary | ICD-10-CM | POA: Diagnosis present

## 2013-11-27 DIAGNOSIS — Z7401 Bed confinement status: Secondary | ICD-10-CM

## 2013-11-27 DIAGNOSIS — Z87891 Personal history of nicotine dependence: Secondary | ICD-10-CM

## 2013-11-27 DIAGNOSIS — R6520 Severe sepsis without septic shock: Secondary | ICD-10-CM

## 2013-11-27 DIAGNOSIS — D46C Myelodysplastic syndrome with isolated del(5q) chromosomal abnormality: Secondary | ICD-10-CM | POA: Diagnosis present

## 2013-11-27 DIAGNOSIS — I252 Old myocardial infarction: Secondary | ICD-10-CM

## 2013-11-27 DIAGNOSIS — J9600 Acute respiratory failure, unspecified whether with hypoxia or hypercapnia: Secondary | ICD-10-CM | POA: Diagnosis present

## 2013-11-27 DIAGNOSIS — Z79899 Other long term (current) drug therapy: Secondary | ICD-10-CM

## 2013-11-27 DIAGNOSIS — I214 Non-ST elevation (NSTEMI) myocardial infarction: Secondary | ICD-10-CM | POA: Diagnosis not present

## 2013-11-27 DIAGNOSIS — R5081 Fever presenting with conditions classified elsewhere: Secondary | ICD-10-CM | POA: Diagnosis present

## 2013-11-27 DIAGNOSIS — J449 Chronic obstructive pulmonary disease, unspecified: Secondary | ICD-10-CM | POA: Diagnosis present

## 2013-11-27 DIAGNOSIS — I2510 Atherosclerotic heart disease of native coronary artery without angina pectoris: Secondary | ICD-10-CM | POA: Diagnosis present

## 2013-11-27 DIAGNOSIS — R0902 Hypoxemia: Secondary | ICD-10-CM

## 2013-11-27 DIAGNOSIS — Z9221 Personal history of antineoplastic chemotherapy: Secondary | ICD-10-CM

## 2013-11-27 DIAGNOSIS — D61818 Other pancytopenia: Secondary | ICD-10-CM | POA: Diagnosis present

## 2013-11-27 DIAGNOSIS — N179 Acute kidney failure, unspecified: Secondary | ICD-10-CM | POA: Diagnosis present

## 2013-11-27 DIAGNOSIS — I5043 Acute on chronic combined systolic (congestive) and diastolic (congestive) heart failure: Secondary | ICD-10-CM | POA: Diagnosis present

## 2013-11-27 DIAGNOSIS — B370 Candidal stomatitis: Secondary | ICD-10-CM | POA: Diagnosis present

## 2013-11-27 HISTORY — DX: Cardiomyopathy, unspecified: I42.9

## 2013-11-27 HISTORY — DX: Essential (primary) hypertension: I10

## 2013-11-27 HISTORY — DX: Atherosclerotic heart disease of native coronary artery without angina pectoris: I25.10

## 2013-11-27 HISTORY — DX: Mixed hyperlipidemia: E78.2

## 2013-11-27 HISTORY — DX: Nonrheumatic aortic (valve) stenosis: I35.0

## 2013-11-27 HISTORY — DX: Chronic kidney disease, stage 3 (moderate): N18.3

## 2013-11-27 HISTORY — DX: Chronic kidney disease, stage 3 unspecified: N18.30

## 2013-11-27 LAB — INFLUENZA PANEL BY PCR (TYPE A & B)
H1N1 flu by pcr: NOT DETECTED
INFLBPCR: NEGATIVE
Influenza A By PCR: NEGATIVE

## 2013-11-27 LAB — CBC WITH DIFFERENTIAL/PLATELET
BASOS ABS: 0.1 10*3/uL (ref 0.0–0.1)
Basophils Relative: 4 % — ABNORMAL HIGH (ref 0–1)
EOS PCT: 6 % — AB (ref 0–5)
Eosinophils Absolute: 0.1 10*3/uL (ref 0.0–0.7)
HCT: 31.8 % — ABNORMAL LOW (ref 39.0–52.0)
Hemoglobin: 10.5 g/dL — ABNORMAL LOW (ref 13.0–17.0)
LYMPHS PCT: 48 % — AB (ref 12–46)
Lymphs Abs: 0.7 10*3/uL (ref 0.7–4.0)
MCH: 30.6 pg (ref 26.0–34.0)
MCHC: 33 g/dL (ref 30.0–36.0)
MCV: 92.7 fL (ref 78.0–100.0)
MONOS PCT: 9 % (ref 3–12)
Monocytes Absolute: 0.2 10*3/uL (ref 0.1–1.0)
Neutro Abs: 0.6 10*3/uL — ABNORMAL LOW (ref 1.7–7.7)
Neutrophils Relative %: 33 % — ABNORMAL LOW (ref 43–77)
PLATELETS: 39 10*3/uL — AB (ref 150–400)
RBC: 3.43 MIL/uL — AB (ref 4.22–5.81)
RDW: 18 % — ABNORMAL HIGH (ref 11.5–15.5)
WBC: 1.7 10*3/uL — AB (ref 4.0–10.5)

## 2013-11-27 LAB — URINALYSIS, ROUTINE W REFLEX MICROSCOPIC
Bilirubin Urine: NEGATIVE
Leukocytes, UA: NEGATIVE
NITRITE: NEGATIVE
Protein, ur: 30 mg/dL — AB
Specific Gravity, Urine: 1.025 (ref 1.005–1.030)
Urobilinogen, UA: 0.2 mg/dL (ref 0.0–1.0)
pH: 5.5 (ref 5.0–8.0)

## 2013-11-27 LAB — MRSA PCR SCREENING: MRSA by PCR: NEGATIVE

## 2013-11-27 LAB — BASIC METABOLIC PANEL
BUN: 16 mg/dL (ref 6–23)
CALCIUM: 8.8 mg/dL (ref 8.4–10.5)
CO2: 23 mEq/L (ref 19–32)
Chloride: 98 mEq/L (ref 96–112)
Creatinine, Ser: 1.79 mg/dL — ABNORMAL HIGH (ref 0.50–1.35)
GFR calc non Af Amer: 34 mL/min — ABNORMAL LOW (ref >90)
GFR, EST AFRICAN AMERICAN: 40 mL/min — AB (ref >90)
Glucose, Bld: 260 mg/dL — ABNORMAL HIGH (ref 70–99)
Potassium: 2.3 mEq/L — CL (ref 3.7–5.3)
SODIUM: 141 meq/L (ref 137–147)

## 2013-11-27 LAB — TROPONIN I
TROPONIN I: 15.61 ng/mL — AB (ref <0.30)
Troponin I: <0.3 ng/mL (ref <0.30)
Troponin I: 3.31 ng/mL (ref <0.30)
Troponin I: 15.23 ng/mL (ref <0.30)

## 2013-11-27 LAB — PRO B NATRIURETIC PEPTIDE: Pro B Natriuretic peptide (BNP): 1952 pg/mL — ABNORMAL HIGH (ref 0–450)

## 2013-11-27 LAB — POCT I-STAT TROPONIN I: TROPONIN I, POC: 0.06 ng/mL (ref 0.00–0.08)

## 2013-11-27 LAB — I-STAT CG4 LACTIC ACID, ED: LACTIC ACID, VENOUS: 4.57 mmol/L — AB (ref 0.5–2.2)

## 2013-11-27 LAB — URINE MICROSCOPIC-ADD ON

## 2013-11-27 MED ORDER — METOPROLOL TARTRATE 25 MG PO TABS
25.0000 mg | ORAL_TABLET | Freq: Two times a day (BID) | ORAL | Status: DC
  Administered 2013-11-27 – 2013-12-08 (×22): 25 mg via ORAL
  Filled 2013-11-27 (×22): qty 1

## 2013-11-27 MED ORDER — VANCOMYCIN HCL 10 G IV SOLR
1250.0000 mg | INTRAVENOUS | Status: DC
  Administered 2013-11-27 – 2013-11-29 (×3): 1250 mg via INTRAVENOUS
  Filled 2013-11-27 (×6): qty 1250

## 2013-11-27 MED ORDER — VANCOMYCIN HCL 10 G IV SOLR
1500.0000 mg | Freq: Once | INTRAVENOUS | Status: AC
  Administered 2013-11-27: 1500 mg via INTRAVENOUS
  Filled 2013-11-27: qty 1500

## 2013-11-27 MED ORDER — DEXTROSE 5 % IV SOLN
INTRAVENOUS | Status: AC
  Filled 2013-11-27: qty 2

## 2013-11-27 MED ORDER — IPRATROPIUM-ALBUTEROL 0.5-2.5 (3) MG/3ML IN SOLN
3.0000 mL | RESPIRATORY_TRACT | Status: DC
  Administered 2013-11-27 – 2013-12-08 (×62): 3 mL via RESPIRATORY_TRACT
  Filled 2013-11-27 (×64): qty 3

## 2013-11-27 MED ORDER — AMIODARONE HCL 200 MG PO TABS
100.0000 mg | ORAL_TABLET | Freq: Every day | ORAL | Status: DC
  Administered 2013-11-27 – 2013-11-29 (×3): 100 mg via ORAL
  Filled 2013-11-27 (×3): qty 1

## 2013-11-27 MED ORDER — ACETAMINOPHEN 500 MG PO TABS
500.0000 mg | ORAL_TABLET | Freq: Four times a day (QID) | ORAL | Status: DC | PRN
  Administered 2013-11-28 – 2013-11-29 (×3): 500 mg via ORAL
  Filled 2013-11-27 (×3): qty 1

## 2013-11-27 MED ORDER — TAMSULOSIN HCL 0.4 MG PO CAPS
0.4000 mg | ORAL_CAPSULE | Freq: Every day | ORAL | Status: DC
  Administered 2013-11-27 – 2013-12-07 (×11): 0.4 mg via ORAL
  Filled 2013-11-27 (×11): qty 1

## 2013-11-27 MED ORDER — POTASSIUM CHLORIDE 10 MEQ/100ML IV SOLN
10.0000 meq | INTRAVENOUS | Status: AC
  Administered 2013-11-27 (×4): 10 meq via INTRAVENOUS
  Filled 2013-11-27 (×4): qty 100

## 2013-11-27 MED ORDER — PREGABALIN 75 MG PO CAPS
75.0000 mg | ORAL_CAPSULE | Freq: Every day | ORAL | Status: DC
  Administered 2013-11-27 – 2013-12-08 (×12): 75 mg via ORAL
  Filled 2013-11-27 (×12): qty 1

## 2013-11-27 MED ORDER — DEXTROSE 5 % IV SOLN
1.0000 g | Freq: Two times a day (BID) | INTRAVENOUS | Status: DC
  Filled 2013-11-27: qty 1

## 2013-11-27 MED ORDER — DIAZEPAM 5 MG PO TABS
5.0000 mg | ORAL_TABLET | Freq: Four times a day (QID) | ORAL | Status: DC | PRN
  Administered 2013-12-03 – 2013-12-07 (×3): 5 mg via ORAL
  Filled 2013-11-27 (×4): qty 1

## 2013-11-27 MED ORDER — ACETAMINOPHEN 325 MG PO TABS
650.0000 mg | ORAL_TABLET | Freq: Once | ORAL | Status: AC
  Administered 2013-11-27: 650 mg via ORAL
  Filled 2013-11-27: qty 2

## 2013-11-27 MED ORDER — POTASSIUM CHLORIDE 10 MEQ/100ML IV SOLN
10.0000 meq | Freq: Once | INTRAVENOUS | Status: AC
  Administered 2013-11-27: 10 meq via INTRAVENOUS
  Filled 2013-11-27: qty 100

## 2013-11-27 MED ORDER — MORPHINE SULFATE 2 MG/ML IJ SOLN
2.0000 mg | Freq: Once | INTRAMUSCULAR | Status: AC
  Administered 2013-11-27: 2 mg via INTRAVENOUS
  Filled 2013-11-27: qty 1

## 2013-11-27 MED ORDER — MORPHINE SULFATE 2 MG/ML IJ SOLN
2.0000 mg | INTRAMUSCULAR | Status: DC | PRN
  Administered 2013-11-27 – 2013-12-02 (×7): 2 mg via INTRAVENOUS
  Filled 2013-11-27 (×7): qty 1

## 2013-11-27 MED ORDER — ISOSORBIDE MONONITRATE ER 60 MG PO TB24
30.0000 mg | ORAL_TABLET | Freq: Every day | ORAL | Status: DC
  Administered 2013-11-27 – 2013-12-08 (×12): 30 mg via ORAL
  Filled 2013-11-27 (×12): qty 1

## 2013-11-27 MED ORDER — NITROGLYCERIN 0.4 MG SL SUBL: 0.4000 mg | SUBLINGUAL_TABLET | SUBLINGUAL | Status: DC | PRN

## 2013-11-27 MED ORDER — POTASSIUM CHLORIDE CRYS ER 20 MEQ PO TBCR
40.0000 meq | EXTENDED_RELEASE_TABLET | Freq: Once | ORAL | Status: AC
  Administered 2013-11-27: 40 meq via ORAL
  Filled 2013-11-27: qty 2

## 2013-11-27 MED ORDER — DEXTROSE 5 % IV SOLN
1.0000 g | Freq: Once | INTRAVENOUS | Status: AC
  Administered 2013-11-27: 1 g via INTRAVENOUS

## 2013-11-27 MED ORDER — ALBUTEROL (5 MG/ML) CONTINUOUS INHALATION SOLN
10.0000 mg/h | INHALATION_SOLUTION | Freq: Once | RESPIRATORY_TRACT | Status: AC
  Administered 2013-11-27: 10 mg/h via RESPIRATORY_TRACT
  Filled 2013-11-27: qty 20

## 2013-11-27 MED ORDER — AMITRIPTYLINE HCL 10 MG PO TABS
30.0000 mg | ORAL_TABLET | Freq: Every day | ORAL | Status: DC
  Administered 2013-11-27 – 2013-12-07 (×6): 30 mg via ORAL
  Filled 2013-11-27 (×10): qty 3

## 2013-11-27 MED ORDER — FUROSEMIDE 10 MG/ML IJ SOLN
40.0000 mg | Freq: Every day | INTRAMUSCULAR | Status: DC
  Administered 2013-11-27 – 2013-11-29 (×3): 40 mg via INTRAVENOUS
  Filled 2013-11-27 (×3): qty 4

## 2013-11-27 MED ORDER — SODIUM CHLORIDE 0.9 % IV BOLUS (SEPSIS)
500.0000 mL | Freq: Once | INTRAVENOUS | Status: AC
  Administered 2013-11-27: 500 mL via INTRAVENOUS

## 2013-11-27 MED ORDER — ASPIRIN EC 81 MG PO TBEC
81.0000 mg | DELAYED_RELEASE_TABLET | Freq: Two times a day (BID) | ORAL | Status: DC
  Administered 2013-11-27 – 2013-12-08 (×23): 81 mg via ORAL
  Filled 2013-11-27 (×23): qty 1

## 2013-11-27 MED ORDER — CEFEPIME HCL 2 G IJ SOLR
2.0000 g | Freq: Two times a day (BID) | INTRAMUSCULAR | Status: DC
  Administered 2013-11-27 – 2013-11-30 (×6): 2 g via INTRAVENOUS
  Filled 2013-11-27 (×13): qty 2

## 2013-11-27 MED ORDER — FUROSEMIDE 10 MG/ML IJ SOLN: 40.0000 mg | Freq: Once | INTRAMUSCULAR | Status: DC

## 2013-11-27 NOTE — Progress Notes (Signed)
Inpatient Diabetes Program Recommendations  AACE/ADA: New Consensus Statement on Inpatient Glycemic Control (2013)  Target Ranges:  Prepandial:   less than 140 mg/dL      Peak postprandial:   less than 180 mg/dL (1-2 hours)      Critically ill patients:  140 - 180 mg/dL   Results for [name], [name] (MRN [id number]) as of 11/27/2013 07:20  Ref. Range 11/27/2013 01:10  Glucose Latest Range: 70-99 mg/dL 260 (H)   Diabetes history: DM2 Outpatient Diabetes medications: Metformin 500 mg BID Current orders for Inpatient glycemic control: None  Inpatient Diabetes Program Recommendations Correction (SSI): While inpatient, please consider ordering CBGs with Novolog correction scale ACHS.  Thanks, Barnie Alderman, RN, MSN, CCRN Diabetes Coordinator Inpatient Diabetes Program [phone] (Team Pager) [phone] ([facility] office) [phone] [facility] office)

## 2013-11-27 NOTE — ED Notes (Signed)
Per EMS: pt c/o congestion and SOB. O2 originally in the 60s with pt on room air and lying flat. Placed upright and on O2, O2 up to 80s. Given albuterol treatment, brought O2 up into the 90s.

## 2013-11-27 NOTE — ED Notes (Signed)
Pt reports onset of chest heaviness approx 1400 yesterday afternoon - pt normally very active - per EMS pt hypoxic on scene and given alb neb tx. Pt A&ox4, pleasant and cooperative, skin warm and dry.

## 2013-11-27 NOTE — Progress Notes (Signed)
CRITICAL VALUE ALERT  Critical value received: Troponin 3.31  Date of notification:  11/27/13  Time of notification:  0944  Critical value read back:yes  Nurse who received alert:  E.Spangler,RN  MD notified (1st page):  Luan Pulling [facility] verbally notified)  Time of first page:  [phone]  MD notified (2nd page):  Time of second page:  Responding MD:  Luan Pulling  Time MD responded:  5830

## 2013-11-27 NOTE — Progress Notes (Addendum)
ANTIBIOTIC CONSULT NOTE - INITIAL  Pharmacy Consult for vancomycin  & cefepime  Indication: pneumonia  Allergies  Allergen Reactions  . Statins Other (See Comments)    Causes legs to hurt.   . Tape     Causes skin to peel off.     Patient Measurements: Height: 6' (182.9 cm) Weight: 195 lb (88.451 kg) IBW/kg (Calculated) : 77.6 Adjusted Body Weight: 81kg  Vital Signs: Temp: 98.7 F (37.1 C) (03/03 0215) Temp src: Oral (03/03 0215) BP: 124/45 mmHg (03/03 0215) Pulse Rate: 105 (03/03 0215) Intake/Output from previous day:   Intake/Output from this shift:    Labs:  Recent Labs  11/27/13 0110  WBC 1.7*  HGB 10.5*  PLT 39*  CREATININE 1.79*   Estimated Creatinine Clearance: 36.7 ml/min (by C-G formula based on Cr of 1.79). No results found for this basename: VANCOTROUGH, [name], VANCORANDOM, GENTTROUGH, GENTPEAK, GENTRANDOM, TOBRATROUGH, TOBRAPEAK, TOBRARND, AMIKACINPEAK, AMIKACINTROU, AMIKACIN,  in the last 72 hours   Microbiology: Recent Results (from the past 720 hour(s))  CULTURE, BLOOD (ROUTINE X 2)     Status: None   Collection Time    11/27/13  1:10 AM      Result Value Ref Range Status   Specimen Description BLOOD RIGHT ANTECUBITAL   Final   Special Requests BOTTLES DRAWN AEROBIC AND ANAEROBIC [place]   Final   Culture PENDING   Incomplete   Report Status PENDING   Incomplete  CULTURE, BLOOD (ROUTINE X 2)     Status: None   Collection Time    11/27/13  1:10 AM      Result Value Ref Range Status   Specimen Description BLOOD RIGHT ANTECUBITAL   Final   Special Requests     Final   Value: BOTTLES DRAWN AEROBIC AND ANAEROBIC AEB 5CC ANA [place]   Culture PENDING   Incomplete   Report Status PENDING   Incomplete    Medical History: Past Medical History  Diagnosis Date  . Hypertension   . COPD (chronic obstructive pulmonary disease)   . CAD (coronary artery disease)     s/p 4V CABG '96, Myoview '10 w/o ischemia  . Arthritis   . Atrial fibrillation      coumadin stopped 12/2011 2/2 anemia  . Dysphagia   . Anemia     requiring blood transfusions  . Carotid stenosis     Dr Deitra Mayo follows.   . Diabetes mellitus, type 2     A1C 6.7% 12/2011  . HLD (hyperlipidemia)     LDL 65 12/2011, intolerant to statins  . Hematuria     felt 2/2 foley insertion  . LV dysfunction     EF 45-50% by echo 2007  . Melena     EGD & Colonoscopy 09/2011 revealed gastritis, erosions, scars, diverticula, 8 colon polyps (largest 1.6cm, not removed 2/2 anticoagulation), small AVM in transverse colon  . History of tobacco abuse     quit 1992  . Anxiety   . Shortness of breath     exertion  . GERD (gastroesophageal reflux disease)   . Myocardial infarction   . Thrombocytopenia 09/01/2012  . Myelodysplastic syndrome with 5 q minus 10/17/2012    On Revlimid 5 mg daily 21 days on and 7 days off    Medications:  Scheduled:   Infusions:  . ceFEPime (MAXIPIME) IV 1 g (11/27/13 0233)  . ceFEPime (MAXIPIME) IV    . potassium chloride    . sodium chloride 500 mL (11/27/13 0234)  .  vancomycin    . vancomycin     PRN:   Assessment: 63yr male being treated for pneumonia with vancomycin and cefepime.  Patient has a CrCl estimated at 49ml/min.  Goal of Therapy:  Desire vancomycin trough 15-55mcg/ml.  Adjust cefepime to q12h with CrCl 40-18ml/min.  Plan:  1.  Vancomycin loading dose 1500mg  IV x 1, then 1250mg  IV q24h 2.  Cefepime 1gm IV q12h 3.  Monitor for indices of infection, renal function, and get steady state vancomycin trough level  [name], [name] 11/27/2013,2:41 AM  Will increase Cefepime to 2gm IV q12h for possible febrile neutropenia.   Netta Cedars, PharmD, BCPS 11/27/2013@7 :44 AM

## 2013-11-27 NOTE — Consult Note (Signed)
Primary cardiologist: Dr. Dorris Carnes Consulting cardiologist: Dr. Satira Sark  Clinical Summary [name] is a medically complex 80 y.o.male currently admitted to the hospital with increasing shortness of breath and diagnosis of possible right-sided pneumonia, also concern for element of mild pulmonary edema. Cardiac history is noted below. Does me that he has been feeling poorly over the last few weeks, has had a productive cough, short of breath with activity, has used intermittent nitroglycerin tablets as well. Felt hot over the weekend, did not check his temperature at that time. He reports compliance with his medications.  Patient was last seen in the office by Dr. Harrington Challenger back in May 2013. Reportedly, last Myoview in 2010 demonstrated no ischemia. He describes fairly stable angina symptoms over the years.  Last echocardiogram in April 2014 demonstrated inferior hypokinesis with moderate LVH and LVEF 45-50%, mild aortic stenosis, mild mitral regurgitation, mild left atrial enlargement, PASP 44 mm mercury.  Carotid Dopplers are followed by Dr. Scot Dock, study in September 2014 revealed 60-79% RICA stenosis, no significant stenosis of the LICA.   Allergies  Allergen Reactions  . Statins Other (See Comments)    Causes legs to hurt.   . Tape     Causes skin to peel off.     Medications Scheduled Medications: . amiodarone  100 mg Oral Daily  . amitriptyline  30 mg Oral QHS  . aspirin EC  81 mg Oral BID  . ceFEPime (MAXIPIME) IV  2 g Intravenous Q12H  . ipratropium-albuterol  3 mL Nebulization Q4H  . metoprolol tartrate  25 mg Oral BID  . potassium chloride  10 mEq Intravenous Q1 Hr x 4  . pregabalin  75 mg Oral Daily  . tamsulosin  0.4 mg Oral QPC supper  . vancomycin  1,250 mg Intravenous Q24H    PRN Medications: acetaminophen, diazepam, morphine injection, nitroGLYCERIN   Past Medical History  Diagnosis Date  . Essential hypertension, benign   . COPD (chronic  obstructive pulmonary disease)   . Coronary atherosclerosis of native coronary artery     Multivessel status post CABG 1996  . Arthritis   . Atrial fibrillation     Coumadin stopped 12/2011 due to anemia  . Dysphagia   . Anemia     Requiring blood transfusions  . Carotid stenosis     Dr Scot Dock   . Diabetes mellitus, type 2   . Mixed hyperlipidemia     Statin intolerant  . Hematuria     Felt related to Foley insertion  . Cardiomyopathy     LVEF 45-50%  . Melena     EGD & Colonoscopy 09/2011 revealed gastritis, erosions, scars, diverticula, 8 colon polyps (largest 1.6cm, not removed 2/2 anticoagulation), small AVM in transverse colon  . History of tobacco abuse     Quit 1992  . Anxiety   . GERD (gastroesophageal reflux disease)   . Myocardial infarction   . Thrombocytopenia   . Myelodysplastic syndrome with 5 q minus 10/17/2012    On Revlimid 5 mg daily 21 days on and 7 days off  . Aortic stenosis     Mild  . CKD (chronic kidney disease) stage 3, GFR 30-59 ml/min     Past Surgical History  Procedure Laterality Date  . Knee surgery  1960    Right knee cartilage  . Rotator cuff repair  1990    right   . Inguinal hernia repair  2000 and 2010    left  . Cholecystectomy  05/2010    Lap chole with biologic mesh repair/reinforcement by  Dr Rise Patience  . Back surgery  02/2006    ruptured disk,  post op diskitis infection requiring prolonged hospital stay and  surgical I & D  . Tonsillectomy    . Esophagogastroduodenoscopy  10/08/2011    Procedure: ESOPHAGOGASTRODUODENOSCOPY (EGD);  Surgeon: [name], MD;  [facility]: [facility];  Service: Endoscopy;  Laterality: N/A;  . Colonoscopy  10/08/2011    Procedure: COLONOSCOPY;  Surgeon: [name], MD;  [facility]: [facility];  Service: Endoscopy;  Laterality: N/A;  . Cataract extraction, bilateral    . Peg placement  07/2006    placed due to prolonged infection, poor po intake, malnutrition during several week hospital stay  resulting from ruptured disc that became infected following surgery  . Colonoscopy  01/14/2012    Procedure: COLONOSCOPY;  Surgeon: [name], MD;  Location: [facility];  Service: Endoscopy;  Laterality: N/A;  945  . Coronary artery bypass graft  1996    CABG x 4  . Ventral hernia repair    . Colonoscopy  04/20/2012    Procedure: COLONOSCOPY;  Surgeon: [name], MD;  Location: [facility];  Service: Endoscopy;  Laterality: N/A;  1030  . Pr vein bypass graft,aorto-fem-pop  1996  . Spine surgery    . Joint replacement  1960    Right Knee  . Lymph node biopsy  08/15/2012    Procedure: LYMPH NODE BIOPSY;  Surgeon: Scherry Ran, MD;  Location: [facility];  Service: General;  Laterality: Left;  Cervical Lymph Node Bx in Minor Room  . Bone marrow biopsy    . Bone marrow aspiration      Family History  Problem Relation Age of Onset  . Heart disease Sister   . Cancer Sister   . Hyperlipidemia Sister   . Hypertension Sister   . Heart disease Brother     Heart Diseast before age 36  . Coronary artery disease Mother   . Cancer Father     Stomach  . Anesthesia problems Neg Hx   . Hypotension Neg Hx   . Malignant hyperthermia Neg Hx   . Pseudochol deficiency Neg Hx     Social History [name] reports that he quit smoking about 23 years ago. His smoking use included Cigarettes. He has a 80 pack-year smoking history. He has never used smokeless tobacco. [name] reports that he does not drink alcohol.  Review of Systems Patient reports decreased appetite in the last few weeks. Occasional leg edema. Otherwise as outlined above.  Physical Examination Blood pressure 131/58, pulse 76, temperature 97.8 F (36.6 C), temperature source Oral, resp. rate 30, height 6' (1.829 m), weight 197 lb 12 oz (89.7 kg), SpO2 94.00%.  Intake/Output Summary (Last 24 hours) at 11/27/13 0911 Last data filed at 11/27/13 6144  Gross per 24 hour  Intake    300 ml  Output      0 ml    Net    300 ml   Telemetry: Sinus rhythm.  Chronically ill-appearing male, short of breath on BiPAP. HEENT: Conjunctiva and lids normal, BiPAP mask in place. Neck: Supple, elevated JVP, soft right carotid bruit, no thyromegaly. Lungs: Scattered rhonchi and crackles, mildly labored breathing at rest. Cardiac: Distant regular rate and rhythm, no S3, 2/6 systolic murmur, no pericardial rub. Abdomen: Soft, nontender, bowel sounds present, no guarding or rebound. Extremities: Trace edema, distal pulses 1-2+. Skin: Warm  and dry. Musculoskeletal: No kyphosis. Neuropsychiatric: Alert and oriented x3, affect grossly appropriate.   Lab Results  Basic Metabolic Panel:  Recent Labs Lab 11/27/13 0110  NA 141  K 2.3*  CL 98  CO2 23  GLUCOSE 260*  BUN 16  CREATININE 1.79*  CALCIUM 8.8    CBC:  Recent Labs Lab 11/27/13 0110  WBC 1.7*  NEUTROABS 0.6*  HGB 10.5*  HCT 31.8*  MCV 92.7  PLT 39*    Cardiac Enzymes:  Recent Labs Lab 11/27/13 0128  TROPONINI <0.30    Pro-BNP: 1952  ECG Atrial fibrillation with LVH and significant repolarization abnormalities, ischemia not excluded.  Imaging PORTABLE CHEST - 1 VIEW  COMPARISON: January 05, 2013  FINDINGS: Diffuse interstitial coarsening, mildly asymmetric to the right. Stable heart size and mediastinal contours. Changes of previous CABG. No significant effusion. No pneumothorax.  IMPRESSION: 1. Mild pulmonary edema. 2. Asymmetric opacification at the right base. Please ensure no pulmonary infectious symptoms to suggest pneumonia.   Impression  1. Presentation with shortness of breath as outlined above, concern for right-sided pneumonia, also potential component of pulmonary edema. He has been having some angina symptoms, initial cardiac markers are normal. ECG shows LVH with fairly significant repolarization abnormalities, ischemia not excluded.  2. Multivessel CAD status post CABG in 1996. Reportedly last  ischemic workup at 2010 was negative.  3. Cardiomyopathy, LVEF 45-50% as of April 2014.  4. Mild aortic stenosis.  5. Paroxysmal atrial fibrillation. Patient has been on amiodarone at low dose long term. He has not been anticoagulated with prior history of progressive anemia, concurrent myelodysplastic syndrome. He is on low-dose aspirin.  6. COPD.  7. Type 2 diabetes mellitus.  8. Myelodysplastic syndrome.  9. Hypokalemia.  10. Acute on chronic renal insufficiency, creatinine 1.7.   Recommendations  Current cardiac regimen includes aspirin, amiodarone at 100 mg daily, and Lopressor. Also start IV Lasix. Continue potassium supplementation. Followup echocardiogram will be obtained to reassess LVEF. Cycle cardiac markers and followup ECG. He continues on respiratory support and broad-spectrum antibiotics per primary team. We will follow with you.  Satira Sark, M.D., F.A.C.C.

## 2013-11-27 NOTE — H&P (Signed)
[name] MRN: [id number] DOB/AGE: 03-01-1932 80 y.o. Primary Care Physician:Laylana Gerwig L, MD Admit date: 11/27/2013 Chief Complaint: Shortness of breath HPI: This is a 80 year old who was in his usual state of health when he developed sudden shortness of breath. He does not tell me about chest tightness or heaviness but he did mention that to the emergency room physician. Chest x-ray was consistent with pneumonia. He does have a history of congestive heart failure however. He also has COPD. He has coronary artery occlusive disease and has had cardiac arrhythmias. He has a blood dyscrasia which is 5 q. Minus. He has been on treatment for that. He had blood transfusions fairly frequently and that seemed to set him into episodes of congestive heart failure. He is on BiPAP now so is hard to get more history from him.   Past Medical History  Diagnosis Date  . Hypertension   . COPD (chronic obstructive pulmonary disease)   . CAD (coronary artery disease)     s/p 4V CABG '96, Myoview '10 w/o ischemia  . Arthritis   . Atrial fibrillation     coumadin stopped 12/2011 2/2 anemia  . Dysphagia   . Anemia     requiring blood transfusions  . Carotid stenosis     Dr Deitra Mayo follows.   . Diabetes mellitus, type 2     A1C 6.7% 12/2011  . HLD (hyperlipidemia)     LDL 65 12/2011, intolerant to statins  . Hematuria     felt 2/2 foley insertion  . LV dysfunction     EF 45-50% by echo 2007  . Melena     EGD & Colonoscopy 09/2011 revealed gastritis, erosions, scars, diverticula, 8 colon polyps (largest 1.6cm, not removed 2/2 anticoagulation), small AVM in transverse colon  . History of tobacco abuse     quit 1992  . Anxiety   . Shortness of breath     exertion  . GERD (gastroesophageal reflux disease)   . Myocardial infarction   . Thrombocytopenia 09/01/2012  . Myelodysplastic syndrome with 5 q minus 10/17/2012    On Revlimid 5 mg daily 21 days on and 7 days off   Past Surgical  History  Procedure Laterality Date  . Knee surgery  1960    Right knee cartilage  . Rotator cuff repair  1990    right   . Inguinal hernia repair  2000 and 2010    left  . Cholecystectomy  05/2010    Lap chole with biologic mesh repair/reinforcement by  Dr Rise Patience  . Back surgery  02/2006    ruptured disk,  post op diskitis infection requiring prolonged hospital stay and  surgical I & D  . Tonsillectomy    . Esophagogastroduodenoscopy  10/08/2011    Procedure: ESOPHAGOGASTRODUODENOSCOPY (EGD);  Surgeon: [name], MD;  [facility];  Service: Endoscopy;  Laterality: N/A;  . Colonoscopy  10/08/2011    Procedure: COLONOSCOPY;  Surgeon: [name], MD;  [facility]: [facility];  Service: Endoscopy;  Laterality: N/A;  . Cataract extraction, bilateral    . Peg placement  07/2006    placed due to prolonged infection, poor po intake, malnutrition during several week hospital stay resulting from ruptured disc that became infected following surgery  . Colonoscopy  01/14/2012    Procedure: COLONOSCOPY;  Surgeon: [name], MD;  [facility]: [facility];  Service: Endoscopy;  Laterality: N/A;  945  . Coronary artery bypass graft  1996  4 by-pass  . Ventral hernia repair    . Colonoscopy  04/20/2012    Procedure: COLONOSCOPY;  Surgeon: Malissa Hippo, MD;  [facility]: [facility];  Service: Endoscopy;  Laterality: N/A;  1030  . Pr vein bypass graft,aorto-fem-pop  1996  . Spine surgery    . Joint replacement  1960    Right Knee  . Lymph node biopsy  08/15/2012    Procedure: LYMPH NODE BIOPSY;  Surgeon: Marlane Hatcher, MD;  Location: [facility];  Service: General;  Laterality: Left;  Cervical Lymph Node Bx in Minor Room  . Bone marrow biopsy    . Bone marrow aspiration          Family History  Problem Relation Age of Onset  . Heart disease Sister   . Cancer Sister   . Hyperlipidemia Sister   . Hypertension Sister   . Heart disease Brother     Heart Diseast  before age 54  . Coronary artery disease Mother   . Cancer Father     stomach  . Anesthesia problems Neg Hx   . Hypotension Neg Hx   . Malignant hyperthermia Neg Hx   . Pseudochol deficiency Neg Hx     Social History:  reports that he quit smoking about 23 years ago. His smoking use included Cigarettes. He has a 80 pack-year smoking history. He has never used smokeless tobacco. He reports that he does not drink alcohol or use illicit drugs.   Allergies:  Allergies  Allergen Reactions  . Statins Other (See Comments)    Causes legs to hurt.   . Tape     Causes skin to peel off.     Medications Prior to Admission  Medication Sig Dispense Refill  . acetaminophen (TYLENOL) 500 MG tablet Take 1 tablet (500 mg total) by mouth every 6 (six) hours as needed. Pain  30 tablet    . albuterol (PROVENTIL HFA;VENTOLIN HFA) 108 (90 BASE) MCG/ACT inhaler Inhale 2 puffs into the lungs every 6 (six) hours as needed for wheezing.  1 Inhaler  2  . amiodarone (PACERONE) 200 MG tablet Take 0.5 tablets (100 mg total) by mouth daily.  30 tablet  5  . amitriptyline (ELAVIL) 10 MG tablet Take 30 mg by mouth at bedtime.      Marland Kitchen amLODipine (NORVASC) 5 MG tablet TAKE ONE TABLET BY MOUTH EVERY DAY FOR BLOOD PRESSURE  90 tablet  3  . aspirin EC 81 MG tablet Take 81 mg by mouth 2 (two) times daily.       . diazepam (VALIUM) 5 MG tablet Take 5 mg by mouth every 6 (six) hours as needed. Muscles Spasms      . furosemide (LASIX) 40 MG tablet Take 1 tablet (40 mg total) by mouth daily. Take one half tablet daily on a routine basis. If your weight goes up by 3 pounds or if you have edema of your legs take one full tablet twice a day for 3 days  30 tablet  11  . HYDROcodone-acetaminophen (NORCO) 5-325 MG per tablet Take 1 tablet by mouth every 6 (six) hours as needed. Pain      . lenalidomide (REVLIMID) 10 MG capsule Take 1 capsule (10 mg total) by mouth daily.  28 capsule  0  . metFORMIN (GLUCOPHAGE) 500 MG tablet Take  500 mg by mouth 2 (two) times daily with a meal.      . metoprolol tartrate (LOPRESSOR) 25 MG tablet Take 25 mg  by mouth 2 (two) times daily.        . Multiple Vitamin (MULTIVITAMIN) tablet Take 1 tablet by mouth daily.        . nitroGLYCERIN (NITROSTAT) 0.4 MG SL tablet Place 1 tablet (0.4 mg total) under the tongue every 5 (five) minutes x 3 doses as needed for chest pain (up to 3 doses).  25 tablet  3  . pregabalin (LYRICA) 75 MG capsule Take 75 mg by mouth daily.      . Red Yeast Rice 600 MG TABS Take 1 tablet by mouth daily.        . silodosin (RAPAFLO) 8 MG CAPS capsule Take 8 mg by mouth daily with breakfast.             [id number] from the symptoms mentioned above,there are no other symptoms referable to all systems reviewed.  Physical Exam: Blood pressure 111/48, pulse 74, temperature 97.8 F (36.6 C), temperature source Oral, resp. rate 29, height 6' (1.829 m), weight 89.7 kg (197 lb 12 oz), SpO2 90.00%.  he is on BiPAP. He still looks short of breath. His pupils are reactive. Nose and throat are clear. His neck is supple. I don't see any JVD when he's sitting upright. His chest shows some rales in the bases bilaterally and rhonchi bilaterally. His heart is irregular without gallop. His abdomen is soft no masses are felt bowel sounds present and active. Extremities showed no edema. Central nervous system examination is grossly intact     Recent Labs  11/27/13 0110  WBC 1.7*  NEUTROABS 0.6*  HGB 10.5*  HCT 31.8*  MCV 92.7  PLT 39*    Recent Labs  11/27/13 0110  NA 141  K 2.3*  CL 98  CO2 23  GLUCOSE 260*  BUN 16  CREATININE 1.79*  CALCIUM 8.8  lablast2(ast:2,ALT:2,alkphos:2,bilitot:2,prot:2,albumin:2)@    Recent Results (from the past 240 hour(s))  CULTURE, BLOOD (ROUTINE X 2)     Status: None   Collection Time    11/27/13  1:10 AM      Result Value Ref Range Status   Specimen Description BLOOD RIGHT ANTECUBITAL   Final   Special Requests BOTTLES DRAWN  AEROBIC AND ANAEROBIC [facility] EACH   Final   Culture PENDING   Incomplete   Report Status PENDING   Incomplete  CULTURE, BLOOD (ROUTINE X 2)     Status: None   Collection Time    11/27/13  1:10 AM      Result Value Ref Range Status   Specimen Description BLOOD RIGHT ANTECUBITAL   Final   Special Requests     Final   Value: BOTTLES DRAWN AEROBIC AND ANAEROBIC AEB 5CC ANA [place]   Culture PENDING   Incomplete   Report Status PENDING   Incomplete  MRSA PCR SCREENING     Status: None   Collection Time    11/27/13  3:32 AM      Result Value Ref Range Status   MRSA by PCR NEGATIVE  NEGATIVE Final   Comment:            The GeneXpert MRSA Assay (FDA     approved for NASAL specimens     only), is one component of a     comprehensive MRSA colonization     surveillance program. It is not     intended to diagnose MRSA     infection nor to guide or     monitor treatment for     MRSA  infections.     Dg Chest Portable 1 View  11/27/2013   CLINICAL DATA:  Shortness of breath  EXAM: PORTABLE CHEST - 1 VIEW  COMPARISON:  January 05, 2013  FINDINGS: Diffuse interstitial coarsening, mildly asymmetric to the right. Stable heart size and mediastinal contours. Changes of previous CABG. No significant effusion. No pneumothorax.  IMPRESSION: 1. Mild pulmonary edema. 2. Asymmetric opacification at the right base. Please ensure no pulmonary infectious symptoms to suggest pneumonia.   Electronically Signed   By: Jorje Guild M.D.   On: 11/27/2013 02:02   Impression:  he has what appears to be pneumonia. He is on treatment. However he also had pulmonary edema described on his chest x-ray. He does have significant cardiac issues. He has a myelodysplastic syndrome in his white blood count is low this morning  Active Problems:   HCAP (healthcare-associated pneumonia)     Plan:  continue with IV antibiotics. He will have oncology consultation. I will check pro BNP troponin and request cardiology  consultation      Cherith Tewell L   11/27/2013, 8:29 AM

## 2013-11-27 NOTE — Progress Notes (Signed)
UR chart review completed.  

## 2013-11-27 NOTE — Progress Notes (Signed)
Dr. Luan Pulling notified of patient current troponin 15.61 at 1635. Dr. Luan Pulling to consult with Dr. Domenic Polite.

## 2013-11-27 NOTE — ED Provider Notes (Signed)
CSN: [id number]     Arrival date & time 11/27/13  0023 History   First MD Initiated Contact with Patient 11/27/13 0030 This chart was scribed for Sharyon Cable, MD by Anastasia Pall, ED Scribe. This patient was seen in room [facility] and the patient's care was started at 12:33 AM.     Chief Complaint  Patient presents with  . Shortness of Breath   Patient is a 80 y.o. male presenting with chest pain. The history is provided by the patient. No language interpreter was used.  Chest Pain Pain quality: tightness   Onset quality:  Sudden Duration:  11 hours Associated symptoms: fever and shortness of breath   Associated symptoms: no abdominal pain and not vomiting    HPI Comments: [name] is a 80 y.o. male who presents to the Emergency Department complaining of sudden chest pain, onset 2:00PM today, with associated chest heaviness, SOB, and fever.  His course is worsening  Per EMS: pt wheezing, c/o congestion and SOB at arrival. O2 originally in the 60s with pt on room air and lying flat. Placed upright and on O2, O2 up to 80s. Given albuterol treatment, brought O2 up into the 90s.   Pt denies vomiiting, diarrhea, abdominal pain, and any other associated symptoms.   PCP - [name], MD   Past Medical History  Diagnosis Date  . Hypertension   . COPD (chronic obstructive pulmonary disease)   . CAD (coronary artery disease)     s/p 4V CABG '96, Myoview '10 w/o ischemia  . Arthritis   . Atrial fibrillation     coumadin stopped 12/2011 2/2 anemia  . Dysphagia   . Anemia     requiring blood transfusions  . Carotid stenosis     Dr Deitra Mayo follows.   . Diabetes mellitus, type 2     A1C 6.7% 12/2011  . HLD (hyperlipidemia)     LDL 65 12/2011, intolerant to statins  . Hematuria     felt 2/2 foley insertion  . LV dysfunction     EF 45-50% by echo 2007  . Melena     EGD & Colonoscopy 09/2011 revealed gastritis, erosions, scars, diverticula, 8 colon polyps  (largest 1.6cm, not removed 2/2 anticoagulation), small AVM in transverse colon  . History of tobacco abuse     quit 1992  . Anxiety   . Shortness of breath     exertion  . GERD (gastroesophageal reflux disease)   . Myocardial infarction   . Thrombocytopenia 09/01/2012  . Myelodysplastic syndrome with 5 q minus 10/17/2012    On Revlimid 5 mg daily 21 days on and 7 days off   Past Surgical History  Procedure Laterality Date  . Knee surgery  1960    Right knee cartilage  . Rotator cuff repair  1990    right   . Inguinal hernia repair  2000 and 2010    left  . Cholecystectomy  05/2010    Lap chole with biologic mesh repair/reinforcement by  Dr Rise Patience  . Back surgery  02/2006    ruptured disk,  post op diskitis infection requiring prolonged hospital stay and  surgical I & D  . Tonsillectomy    . Esophagogastroduodenoscopy  10/08/2011    Procedure: ESOPHAGOGASTRODUODENOSCOPY (EGD);  Surgeon: [name], MD;  Location: [facility];  Service: Endoscopy;  Laterality: N/A;  . Colonoscopy  10/08/2011    Procedure: COLONOSCOPY;  Surgeon: [name], MD;  Location: [facility];  Service: Endoscopy;  Laterality: N/A;  . Cataract extraction, bilateral    . Peg placement  07/2006    placed due to prolonged infection, poor po intake, malnutrition during several week hospital stay resulting from ruptured disc that became infected following surgery  . Colonoscopy  01/14/2012    Procedure: COLONOSCOPY;  Surgeon: [name], MD;  Location: [facility];  Service: Endoscopy;  Laterality: N/A;  945  . Coronary artery bypass graft  1996    4 by-pass  . Ventral hernia repair    . Colonoscopy  04/20/2012    Procedure: COLONOSCOPY;  Surgeon: [name], MD;  Location: [facility];  Service: Endoscopy;  Laterality: N/A;  1030  . Pr vein bypass graft,aorto-fem-pop  1996  . Spine surgery    . Joint replacement  1960    Right Knee  . Lymph node biopsy  08/15/2012    Procedure:  LYMPH NODE BIOPSY;  Surgeon: Scherry Ran, MD;  Location: [facility];  Service: General;  Laterality: Left;  Cervical Lymph Node Bx in Minor Room  . Bone marrow biopsy    . Bone marrow aspiration     Family History  Problem Relation Age of Onset  . Heart disease Sister   . Cancer Sister   . Hyperlipidemia Sister   . Hypertension Sister   . Heart disease Brother     Heart Diseast before age 33  . Coronary artery disease Mother   . Cancer Father     stomach  . Anesthesia problems Neg Hx   . Hypotension Neg Hx   . Malignant hyperthermia Neg Hx   . Pseudochol deficiency Neg Hx    History  Substance Use Topics  . Smoking status: Former Smoker -- 2.00 packs/day for 40 years    Types: Cigarettes    Quit date: 09/27/1990  . Smokeless tobacco: Never Used  . Alcohol Use: No    Review of Systems  Constitutional: Positive for fever.  HENT: Positive for congestion.   Respiratory: Positive for chest tightness and shortness of breath.   Cardiovascular: Positive for chest pain.  Gastrointestinal: Negative for vomiting, abdominal pain and diarrhea.  All other systems reviewed and are negative.   Allergies  Statins and Tape  Home Medications   Current Outpatient Rx  Name  Route  Sig  Dispense  Refill  . acetaminophen (TYLENOL) 500 MG tablet   Oral   Take 1 tablet (500 mg total) by mouth every 6 (six) hours as needed. Pain   30 tablet      . albuterol (PROVENTIL HFA;VENTOLIN HFA) 108 (90 BASE) MCG/ACT inhaler   Inhalation   Inhale 2 puffs into the lungs every 6 (six) hours as needed for wheezing.   1 Inhaler   2   . amiodarone (PACERONE) 200 MG tablet   Oral   Take 0.5 tablets (100 mg total) by mouth daily.   30 tablet   5   . amitriptyline (ELAVIL) 10 MG tablet   Oral   Take 30 mg by mouth at bedtime.         Marland Kitchen amLODipine (NORVASC) 5 MG tablet      TAKE ONE TABLET BY MOUTH EVERY DAY FOR BLOOD PRESSURE   90 tablet   3   . aspirin EC 81 MG tablet   Oral    Take 81 mg by mouth 2 (two) times daily.          . diazepam (VALIUM) 5  MG tablet   Oral   Take 5 mg by mouth every 6 (six) hours as needed. Muscles Spasms         . furosemide (LASIX) 40 MG tablet   Oral   Take 1 tablet (40 mg total) by mouth daily. Take one half tablet daily on a routine basis. If your weight goes up by 3 pounds or if you have edema of your legs take one full tablet twice a day for 3 days   30 tablet   11     Take when necessary for edema   . HYDROcodone-acetaminophen (NORCO) 5-325 MG per tablet   Oral   Take 1 tablet by mouth every 6 (six) hours as needed. Pain         . lenalidomide (REVLIMID) 10 MG capsule   Oral   Take 1 capsule (10 mg total) by mouth daily.   28 capsule   0   . metFORMIN (GLUCOPHAGE) 500 MG tablet   Oral   Take 500 mg by mouth 2 (two) times daily with a meal.         . metoprolol tartrate (LOPRESSOR) 25 MG tablet   Oral   Take 25 mg by mouth 2 (two) times daily.           . Multiple Vitamin (MULTIVITAMIN) tablet   Oral   Take 1 tablet by mouth daily.           . nitroGLYCERIN (NITROSTAT) 0.4 MG SL tablet   Sublingual   Place 1 tablet (0.4 mg total) under the tongue every 5 (five) minutes x 3 doses as needed for chest pain (up to 3 doses).   25 tablet   3   . pregabalin (LYRICA) 75 MG capsule   Oral   Take 75 mg by mouth daily.         . Red Yeast Rice 600 MG TABS   Oral   Take 1 tablet by mouth daily.           . silodosin (RAPAFLO) 8 MG CAPS capsule   Oral   Take 8 mg by mouth daily with breakfast.            BP 156/53  Pulse 103  Temp(Src) 101.9 F (38.8 C) (Oral)  Resp 22  Ht 6' (1.829 m)  Wt 195 lb (88.451 kg)  BMI 26.44 kg/m2  SpO2 89% BP 124/45  Pulse 105  Temp(Src) 98.7 F (37.1 C) (Oral)  Resp 25  Ht 6' (1.829 m)  Wt 195 lb (88.451 kg)  BMI 26.44 kg/m2  SpO2 98%   Physical Exam  Nursing note and vitals reviewed. CONSTITUTIONAL: Well developed/well nourished,  tachypneic HEAD: Normocephalic/atraumatic EYES: EOMI/PERRL ENMT: Mucous membranes moist NECK: supple no meningeal signs SPINE:entire spine nontender CV: S1/S2 noted, no murmurs/rubs/gallops noted LUNGS: Tachypneic. Course breath sounds bilaterally.  ABDOMEN: soft, nontender, no rebound or guarding GU:no cva tenderness NEURO: Pt is awake/alert, moves all extremitiesx4 EXTREMITIES: pulses normal, full ROM SKIN: warm, color normal PSYCH: no abnormalities of mood noted   ED Course  Procedures  CRITICAL CARE Performed by: Sharyon Cable Total critical care time: 35 Critical care time was exclusive of separately billable procedures and treating other patients. Critical care was necessary to treat or prevent imminent or life-threatening deterioration. Critical care was time spent personally by me on the following activities: development of treatment plan with patient and/or surrogate as well as nursing, discussions with consultants, evaluation of patient's response to treatment, examination  of patient, obtaining history from patient or surrogate, ordering and performing treatments and interventions, ordering and review of laboratory studies, ordering and review of radiographic studies, pulse oximetry and re-evaluation of patient's condition.  DIAGNOSTIC STUDIES: Oxygen Saturation is 89% on room air, low by my interpretation.    COORDINATION OF CARE: 12:38 AM-Discussed treatment plan which includes breathing treatment, blood work, UA, EKG, and cardiac monitoring with pt at bedside and pt agreed to plan.   1:37 AM Pt with fever,cough, sob and chest pain Suspicion for pneumonia He takes chemotherapy (myelodysplastic syndrome) will treat as HCAP He does have abnormal EKG, but I suspect primary infectious source 2:28 AM Clinically improved.  He is not hypotensive.  His resp effort is improved and he is talkative He reports CP improved Denies abd pain Pt with multiple issues including  pneumonia, neutropenic fever as well as hypoKalemia with EKG changes.  His lactate is elevated, will give IV hydration slowly as he does have component of CHF Will need stepdown monitoring D/w dr Willey Blade.  Will place on stepdown  Labs Review Labs Reviewed  CBC WITH DIFFERENTIAL - Abnormal; Notable for the following:    WBC 1.7 (*)    RBC 3.43 (*)    Hemoglobin 10.5 (*)    HCT 31.8 (*)    RDW 18.0 (*)    Platelets 39 (*)    Neutrophils Relative % 33 (*)    Lymphocytes Relative 48 (*)    Eosinophils Relative 6 (*)    Basophils Relative 4 (*)    Neutro Abs 0.6 (*)    All other components within normal limits  BASIC METABOLIC PANEL - Abnormal; Notable for the following:    Potassium 2.3 (*)    Glucose, Bld 260 (*)    Creatinine, Ser 1.79 (*)    GFR calc non Af Amer 34 (*)    GFR calc Af Amer 40 (*)    All other components within normal limits  PRO B NATRIURETIC PEPTIDE - Abnormal; Notable for the following:    Pro B Natriuretic peptide (BNP) 1952.0 (*)    All other components within normal limits  I-STAT CG4 LACTIC ACID, ED - Abnormal; Notable for the following:    Lactic Acid, Venous 4.57 (*)    All other components within normal limits  URINE CULTURE  CULTURE, BLOOD (ROUTINE X 2)  CULTURE, BLOOD (ROUTINE X 2)  TROPONIN I  URINALYSIS, ROUTINE W REFLEX MICROSCOPIC  INFLUENZA PANEL BY PCR (TYPE A & B, H1N1)  I-STAT TROPOININ, ED   Imaging Review Dg Chest Portable 1 View  11/27/2013   CLINICAL DATA:  Shortness of breath  EXAM: PORTABLE CHEST - 1 VIEW  COMPARISON:  January 05, 2013  FINDINGS: Diffuse interstitial coarsening, mildly asymmetric to the right. Stable heart size and mediastinal contours. Changes of previous CABG. No significant effusion. No pneumothorax.  IMPRESSION: 1. Mild pulmonary edema. 2. Asymmetric opacification at the right base. Please ensure no pulmonary infectious symptoms to suggest pneumonia.   Electronically Signed   By: Jorje Guild M.D.   On: 11/27/2013  02:02     EKG Interpretation   Date/Time:  Tuesday November 27 2013 01:12:11 EST Ventricular Rate:  99 PR Interval:    QRS Duration: 110 QT Interval:  322 QTC Calculation: 413 R Axis:   38 Text Interpretation:  Atrial fibrillation Marked ST abnormality, possible  inferior subendocardial injury Marked ST abnormality, possible  anterolateral subendocardial injury Abnormal ECG When compared with ECG of  05-Jan-2013 08:57,  Significant changes have occurred Confirmed by Christy Gentles   MD, Dannetta Lekas ([id number]) on 11/27/2013 1:15:43 AM     Medications  morphine 2 MG/ML injection 2 mg (not administered)    MDM   Final diagnoses:  HCAP (healthcare-associated pneumonia)  Hypoxia  Neutropenic fever  Hypokalemia  Sepsis    Nursing notes including past medical history and social history reviewed and considered in documentation xrays reviewed and considered Labs/vital reviewed and considered  I personally performed the services described in this documentation, which was scribed in my presence. The recorded information has been reviewed and is accurate.    Sharyon Cable, MD 11/27/13 [phone]

## 2013-11-27 NOTE — Progress Notes (Signed)
     Cardiac enzymes noted following consultation earlier this morning. Increase in troponin I from normal to 3.31. Consistent with NSTEMI. I discussed with Dr. Luan Pulling. At this point would suspect demand ischemia with type II event. Particularly in light of his myelodysplastic syndrome with platelet count of 39, WBC 1.7, hemoglobin 10.5, try and manage medically. Would hold off heparin at this point to reduce risk of bleeding. Continue aspirin, amiodarone, beta blocker. Add Imdur.  Satira Sark, M.D., F.A.C.C.

## 2013-11-27 NOTE — ED Notes (Signed)
CRITICAL VALUE ALERT  Critical value received:  K+ 2.3  Date of notification:  11/27/2013  Time of notification:  0207  Critical value read back:yes  Nurse who received alert:  Lucila Maine   MD notified (1st page):  Dr Christy Gentles  Time of first page:  0210  MD notified (2nd page):  Time of second page:  Responding MD:  Dr Christy Gentles  Time MD responded:  [phone]

## 2013-11-28 ENCOUNTER — Other Ambulatory Visit (HOSPITAL_COMMUNITY): Payer: Self-pay | Admitting: Oncology

## 2013-11-28 DIAGNOSIS — I359 Nonrheumatic aortic valve disorder, unspecified: Secondary | ICD-10-CM

## 2013-11-28 DIAGNOSIS — D709 Neutropenia, unspecified: Secondary | ICD-10-CM

## 2013-11-28 DIAGNOSIS — I214 Non-ST elevation (NSTEMI) myocardial infarction: Secondary | ICD-10-CM

## 2013-11-28 DIAGNOSIS — J9600 Acute respiratory failure, unspecified whether with hypoxia or hypercapnia: Secondary | ICD-10-CM

## 2013-11-28 DIAGNOSIS — I252 Old myocardial infarction: Secondary | ICD-10-CM

## 2013-11-28 DIAGNOSIS — D61818 Other pancytopenia: Secondary | ICD-10-CM

## 2013-11-28 DIAGNOSIS — D46C Myelodysplastic syndrome with isolated del(5q) chromosomal abnormality: Secondary | ICD-10-CM

## 2013-11-28 DIAGNOSIS — R5081 Fever presenting with conditions classified elsewhere: Secondary | ICD-10-CM

## 2013-11-28 DIAGNOSIS — J189 Pneumonia, unspecified organism: Secondary | ICD-10-CM

## 2013-11-28 LAB — CBC WITH DIFFERENTIAL/PLATELET
BASOS PCT: 4 % — AB (ref 0–1)
Basophils Absolute: 0.1 10*3/uL (ref 0.0–0.1)
EOS ABS: 0.1 10*3/uL (ref 0.0–0.7)
Eosinophils Relative: 9 % — ABNORMAL HIGH (ref 0–5)
HCT: 27.6 % — ABNORMAL LOW (ref 39.0–52.0)
Hemoglobin: 9.1 g/dL — ABNORMAL LOW (ref 13.0–17.0)
Lymphocytes Relative: 20 % (ref 12–46)
Lymphs Abs: 0.3 10*3/uL — ABNORMAL LOW (ref 0.7–4.0)
MCH: 30.8 pg (ref 26.0–34.0)
MCHC: 33 g/dL (ref 30.0–36.0)
MCV: 93.6 fL (ref 78.0–100.0)
Monocytes Absolute: 0.1 10*3/uL (ref 0.1–1.0)
Monocytes Relative: 8 % (ref 3–12)
NEUTROS ABS: 0.9 10*3/uL — AB (ref 1.7–7.7)
NEUTROS PCT: 59 % (ref 43–77)
PLATELETS: 41 10*3/uL — AB (ref 150–400)
RBC: 2.95 MIL/uL — ABNORMAL LOW (ref 4.22–5.81)
RDW: 18.6 % — ABNORMAL HIGH (ref 11.5–15.5)
SMEAR REVIEW: DECREASED
WBC: 1.4 10*3/uL — CL (ref 4.0–10.5)

## 2013-11-28 LAB — BASIC METABOLIC PANEL
BUN: 25 mg/dL — AB (ref 6–23)
CALCIUM: 8.5 mg/dL (ref 8.4–10.5)
CO2: 25 mEq/L (ref 19–32)
Chloride: 106 mEq/L (ref 96–112)
Creatinine, Ser: 1.71 mg/dL — ABNORMAL HIGH (ref 0.50–1.35)
GFR, EST AFRICAN AMERICAN: 42 mL/min — AB (ref >90)
GFR, EST NON AFRICAN AMERICAN: 36 mL/min — AB (ref >90)
GLUCOSE: 157 mg/dL — AB (ref 70–99)
Potassium: 3.6 mEq/L — ABNORMAL LOW (ref 3.7–5.3)
Sodium: 143 mEq/L (ref 137–147)

## 2013-11-28 LAB — LACTATE DEHYDROGENASE: LDH: 232 U/L (ref 94–250)

## 2013-11-28 LAB — LACTIC ACID, PLASMA: LACTIC ACID, VENOUS: 1.3 mmol/L (ref 0.5–2.2)

## 2013-11-28 MED ORDER — SODIUM CHLORIDE 0.9 % IV BOLUS (SEPSIS)
300.0000 mL | Freq: Once | INTRAVENOUS | Status: AC
  Administered 2013-11-28: 300 mL via INTRAVENOUS

## 2013-11-28 MED ORDER — IBUPROFEN 800 MG PO TABS
400.0000 mg | ORAL_TABLET | Freq: Three times a day (TID) | ORAL | Status: DC | PRN
  Administered 2013-11-28 – 2013-11-29 (×2): 400 mg via ORAL
  Filled 2013-11-28 (×2): qty 1

## 2013-11-28 NOTE — Progress Notes (Signed)
Pt appears to be resting comfortably, vss but bp starting to get soft, spoke to charge nurse concerning this matter, no c/o pain, low bed, hob at 30 degrees, call bell at pt's side, will cont to monitor,

## 2013-11-28 NOTE — Progress Notes (Signed)
*  PRELIMINARY RESULTS* Echocardiogram 2D Echocardiogram has been performed.  [place], [place] 11/28/2013, 10:00 AM

## 2013-11-28 NOTE — Progress Notes (Signed)
Subjective: Patient is alert and awake. He is on BIPAP. Patient feels slightly better. No fever or chills.  Objective: Vital signs in last 24 hours: Temp:  [97.2 F (36.2 C)-102.6 F (39.2 C)] 102.6 F (39.2 C) (03/04 0730) Pulse Rate:  [25-82] 80 (03/04 0730) Resp:  [15-34] 29 (03/04 0730) BP: (104-151)/(40-109) 142/51 mmHg (03/04 0730) SpO2:  [86 %-100 %] 90 % (03/04 1157) FiO2 (%):  [55 %-60 %] 55 % (03/04 1157) Weight:  [91.4 kg (201 lb 8 oz)] 91.4 kg (201 lb 8 oz) (03/04 0500) Weight change: 2.949 kg (6 lb 8 oz) Last BM Date: 11/26/13  Intake/Output from previous day: 03/03 0701 - 03/04 0700 In: 550 [IV Piggyback:550] Out: 1850 [Urine:1850]  PHYSICAL EXAM General appearance: alert, mild distress and slowed mentation Resp: diminished breath sounds bilaterally and rhonchi bilaterally Cardio: regular rate and rhythm, S1, S2 normal, no murmur, click, rub or gallop GI: soft, non-tender; bowel sounds normal; no masses,  no organomegaly Extremities: extremities normal, atraumatic, no cyanosis or edema  Lab Results:    @labtest @ [facility] No results found for this basename: PHART, PCO2, PO2ART, TCO2, HCO3,  in the last 72 hours CULTURES Recent Results (from the past 240 hour(s))  CULTURE, BLOOD (ROUTINE X 2)     Status: None   Collection Time    11/27/13  1:10 AM      Result Value Ref Range Status   Specimen Description BLOOD RIGHT ANTECUBITAL   Final   Special Requests BOTTLES DRAWN AEROBIC AND ANAEROBIC 6CC EACH   Final   Culture NO GROWTH 1 DAY   Final   Report Status PENDING   Incomplete  CULTURE, BLOOD (ROUTINE X 2)     Status: None   Collection Time    11/27/13  1:10 AM      Result Value Ref Range Status   Specimen Description BLOOD RIGHT ANTECUBITAL   Final   Special Requests     Final   Value: BOTTLES DRAWN AEROBIC AND ANAEROBIC AEB=5CC ANA=7CC   Culture NO GROWTH 1 DAY   Final   Report Status PENDING   Incomplete  MRSA PCR SCREENING     Status: None   Collection Time    11/27/13  3:32 AM      Result Value Ref Range Status   MRSA by PCR NEGATIVE  NEGATIVE Final   Comment:            The GeneXpert MRSA Assay (FDA     approved for NASAL specimens     only), is one component of a     comprehensive MRSA colonization     surveillance program. It is not     intended to diagnose MRSA     infection nor to guide or     monitor treatment for     MRSA infections.  URINE CULTURE     Status: None   Collection Time    11/27/13  5:23 AM      Result Value Ref Range Status   Specimen Description URINE, CLEAN CATCH   Final   Special Requests NONE   Final   Culture  Setup Time     Final   Value: 11/27/2013 10:30     Performed at [facility] Count     Final   Value: 20,OOO COLONIES/ML     Performed at [facility]   Culture     Final   Value: ENTEROCOCCUS SPECIES  Performed at [facility]   Report Status PENDING   Incomplete   Studies/Results: Dg Chest Portable 1 View  11/27/2013   CLINICAL DATA:  Shortness of breath  EXAM: PORTABLE CHEST - 1 VIEW  COMPARISON:  January 05, 2013  FINDINGS: Diffuse interstitial coarsening, mildly asymmetric to the right. Stable heart size and mediastinal contours. Changes of previous CABG. No significant effusion. No pneumothorax.  IMPRESSION: 1. Mild pulmonary edema. 2. Asymmetric opacification at the right base. Please ensure no pulmonary infectious symptoms to suggest pneumonia.   Electronically Signed   By: Jorje Guild M.D.   On: 11/27/2013 02:02    Medications: I have reviewed the patient's current medications.  Assesment: Active Problems:   HCAP (healthcare-associated pneumonia)   Community acquired pneumonia   NSTEMI (non-ST elevated myocardial infarction)    Plan: Medications reviewed Continue BIPAP  As per cardiology recommendation Will monitor CBC     LOS: 1 day   Jens Siems 11/28/2013, 1:12 PM

## 2013-11-28 NOTE — Progress Notes (Signed)
Dr. Legrand Rams notified of patient work of breathing, desaturation off of BiPap, critical WBC of 1.4, and axillary temperature >102.

## 2013-11-28 NOTE — Consult Note (Signed)
[facility] Consultation Oncology  Name: [name]      MRN: [id number]    Location: IC07/IC07-01  Date: 11/28/2013 Time:4:08 PM   REFERRING PHYSICIAN:  Sinda Du, MD  REASON FOR CONSULT:  Leukopenia   DIAGNOSIS:  5q - MDS  HISTORY OF PRESENT ILLNESS:   [name] is a 80 year old Caucasian man who is well-known to the [facility] where he was diagnosed and treated for 5q- MDS.  He was started on Revlimid 5 mg 21 days on and 7 days with titration of dose to full dose of 10 mg daily with an excellent response becoming completely transfusion independent. His last transfusion according to our records was on 01/16/2014.  He presented to the [facility] ED on 11/27/2013 for suspected pneumonia.    Myelodysplastic syndrome with 5 q minus   09/05/2012 Bone Marrow Biopsy 5 q- MDS   09/19/2012 - 12/15/2012 Chemotherapy Approximate start date of Revlimid 5 mg 21 days on and 7 days off   12/16/2012 - 07/18/2013 Chemotherapy Dose increased to 10 mg Revlimid 21 days on and 7 days off   07/19/2013 -  Chemotherapy Dose increased to 10 mg daily    He was admitted to the hospital from the ED with suspected pneumonia and a chest xray suspicious for infectious lung issue.  Cardiology was consulted for history of cardiac issues and increased SOB.  Troponin level was performed during hospitalization and these were noted to be significantly elevated indicative of a NSTEMI.    He is seen in ICU on BIPAP.  He is easily aroused.  No family is at the bedside.  He is seen smiling.  I provided education regarding Revlimid and given his present hospital issues, holding that medication is absolutely appropriate given that Revlimid can be prothrombogenic.  As an outpatient, he was protected from the thrombogenicity or Revlimid since he was on ASA daily.   PAST MEDICAL HISTORY:   Past Medical History  Diagnosis Date  . Essential hypertension, benign   . COPD (chronic obstructive pulmonary  disease)   . Coronary atherosclerosis of native coronary artery     Multivessel status post CABG 1996  . Arthritis   . Atrial fibrillation     Coumadin stopped 12/2011 due to anemia  . Dysphagia   . Anemia     Requiring blood transfusions  . Carotid stenosis     Dr Scot Dock   . Diabetes mellitus, type 2   . Mixed hyperlipidemia     Statin intolerant  . Hematuria     Felt related to Foley insertion  . Cardiomyopathy     LVEF 45-50%  . Melena     EGD & Colonoscopy 09/2011 revealed gastritis, erosions, scars, diverticula, 8 colon polyps (largest 1.6cm, not removed 2/2 anticoagulation), small AVM in transverse colon  . History of tobacco abuse     Quit 1992  . Anxiety   . GERD (gastroesophageal reflux disease)   . Myocardial infarction   . Thrombocytopenia   . Myelodysplastic syndrome with 5 q minus 10/17/2012    On Revlimid 5 mg daily 21 days on and 7 days off  . Aortic stenosis     Mild  . CKD (chronic kidney disease) stage 3, GFR 30-59 ml/min     ALLERGIES: Allergies  Allergen Reactions  . Statins Other (See Comments)    Causes legs to hurt.   . Tape     Causes skin to peel off.  MEDICATIONS: I have reviewed the patient's current medications.     PAST SURGICAL HISTORY Past Surgical History  Procedure Laterality Date  . Knee surgery  1960    Right knee cartilage  . Rotator cuff repair  1990    right   . Inguinal hernia repair  2000 and 2010    left  . Cholecystectomy  05/2010    Lap chole with biologic mesh repair/reinforcement by  Dr Rise Patience  . Back surgery  02/2006    ruptured disk,  post op diskitis infection requiring prolonged hospital stay and  surgical I & D  . Tonsillectomy    . Esophagogastroduodenoscopy  10/08/2011    Procedure: ESOPHAGOGASTRODUODENOSCOPY (EGD);  Surgeon: [name], MD;  Location: [facility] END[facility];  Service: Endoscopy;  Laterality: N/A;  . Colonoscopy  10/08/2011    Procedure: COLONOSCOPY;  Surgeon: [name], MD;   Location: [facility] ENDO SU[facility];  Service: Endoscopy;  Laterality: N/A;  . Cataract extraction, bilateral    . Peg placement  07/2006    placed due to prolonged infection, poor po intake, malnutrition during several week hospital stay resulting from ruptured disc that became infected following surgery  . Colonoscopy  01/14/2012    Procedure: COLONOSCOPY;  Surgeon: [name], MD;  Location: [facility] ENDO SU[facility];  Service: Endoscopy;  Laterality: N/A;  945  . Coronary artery bypass graft  1996    CABG x 4  . Ventral hernia repair    . Colonoscopy  04/20/2012    Procedure: COLONOSCOPY;  Surgeon: [name], MD;  Location: [facility] ENDO SU[facility];  Service: Endoscopy;  Laterality: N/A;  1030  . Pr vein bypass graft,aorto-fem-pop  1996  . Spine surgery    . Joint replacement  1960    Right Knee  . Lymph node biopsy  08/15/2012    Procedure: LYMPH NODE BIOPSY;  Surgeon: Scherry Ran, MD;  Location: [facility] ORS;  Service: General;  Laterality: Left;  Cervical Lymph Node Bx in Minor Room  . Bone marrow biopsy    . Bone marrow aspiration      FAMILY HISTORY: Family History  Problem Relation Age of Onset  . Heart disease Sister   . Cancer Sister   . Hyperlipidemia Sister   . Hypertension Sister   . Heart disease Brother     Heart Diseast before age 56  . Coronary artery disease Mother   . Cancer Father     Stomach  . Anesthesia problems Neg Hx   . Hypotension Neg Hx   . Malignant hyperthermia Neg Hx   . Pseudochol deficiency Neg Hx     SOCIAL HISTORY:  reports that he quit smoking about 23 years ago. His smoking use included Cigarettes. He has a 80 pack-year smoking history. He has never used smokeless tobacco. He reports that he does not drink alcohol or use illicit drugs.  PERFORMANCE STATUS: The patient's performance status is 4 - Bedbound  PHYSICAL EXAM: Most Recent Vital Signs: Blood pressure 136/53, pulse 79, temperature 102.9 F (39.4 C), temperature source Axillary, resp. rate 29,  height 6' (1.829 m), weight 201 lb 8 oz (91.4 kg), SpO2 92.00%. General appearance: alert, cooperative, appears stated age, no distress and BiPap in place Head: Normocephalic, without obvious abnormality, atraumatic Eyes: negative findings: lids and lashes normal, conjunctivae and sclerae normal and corneas clear Neck: supple, symmetrical, trachea midline Abdomen: normal findings: bowel sounds normal and soft, non-tender Extremities: extremities normal, atraumatic, no cyanosis or edema Skin: Skin  color, texture, turgor normal. No rashes or lesions Neurologic: Mental status: alertness: alert, orientation: time, person, place  LABORATORY DATA:  Results for orders placed during the hospital encounter of 11/27/13 (from the past 48 hour(s))  CBC WITH DIFFERENTIAL     Status: Abnormal   Collection Time    11/27/13  1:10 AM      Result Value Ref Range   WBC 1.7 (*) 4.0 - 10.5 K/uL   Comment: RESULT REPEATED AND VERIFIED   RBC 3.43 (*) 4.22 - 5.81 MIL/uL   Hemoglobin 10.5 (*) 13.0 - 17.0 g/dL   HCT 31.8 (*) 39.0 - 52.0 %   MCV 92.7  78.0 - 100.0 fL   MCH 30.6  26.0 - 34.0 pg   MCHC 33.0  30.0 - 36.0 g/dL   RDW 18.0 (*) 11.5 - 15.5 %   Platelets 39 (*) 150 - 400 K/uL   Comment: RESULT REPEATED AND VERIFIED   Neutrophils Relative % 33 (*) 43 - 77 %   Lymphocytes Relative 48 (*) 12 - 46 %   Monocytes Relative 9  3 - 12 %   Eosinophils Relative 6 (*) 0 - 5 %   Basophils Relative 4 (*) 0 - 1 %   Neutro Abs 0.6 (*) 1.7 - 7.7 K/uL   Lymphs Abs 0.7  0.7 - 4.0 K/uL   Monocytes Absolute 0.2  0.1 - 1.0 K/uL   Eosinophils Absolute 0.1  0.0 - 0.7 K/uL   Basophils Absolute 0.1  0.0 - 0.1 K/uL   RBC Morphology ELLIPTOCYTES     Comment: TEARDROP CELLS   WBC Morphology WHITE COUNT CONFIRMED ON SMEAR     Smear Review PLATELET COUNT CONFIRMED BY SMEAR    BASIC METABOLIC PANEL     Status: Abnormal   Collection Time    11/27/13  1:10 AM      Result Value Ref Range   Sodium 141  137 - 147 mEq/L    Potassium 2.3 (*) 3.7 - 5.3 mEq/L   Comment: CRITICAL RESULT CALLED TO, READ BACK BY AND VERIFIED WITH:     BELTON K AT 0207 ON 0303 BY FORSYTH K   Chloride 98  96 - 112 mEq/L   CO2 23  19 - 32 mEq/L   Glucose, Bld 260 (*) 70 - 99 mg/dL   BUN 16  6 - 23 mg/dL   Creatinine, Ser 1.79 (*) 0.50 - 1.35 mg/dL   Calcium 8.8  8.4 - 10.5 mg/dL   GFR calc non Af Amer 34 (*) >90 mL/min   GFR calc Af Amer 40 (*) >90 mL/min   Comment: (NOTE)     The eGFR has been calculated using the CKD EPI equation.     This calculation has not been validated in all clinical situations.     eGFR's persistently <90 mL/min signify possible Chronic Kidney     Disease.  PRO B NATRIURETIC PEPTIDE     Status: Abnormal   Collection Time    11/27/13  1:10 AM      Result Value Ref Range   Pro B Natriuretic peptide (BNP) 1952.0 (*) 0 - 450 pg/mL  CULTURE, BLOOD (ROUTINE X 2)     Status: None   Collection Time    11/27/13  1:10 AM      Result Value Ref Range   Specimen Description BLOOD RIGHT ANTECUBITAL     Special Requests BOTTLES DRAWN AEROBIC AND ANAEROBIC 6CC EACH     Culture NO GROWTH  1 DAY     Report Status PENDING    CULTURE, BLOOD (ROUTINE X 2)     Status: None   Collection Time    11/27/13  1:10 AM      Result Value Ref Range   Specimen Description BLOOD RIGHT ANTECUBITAL     Special Requests       Value: BOTTLES DRAWN AEROBIC AND ANAEROBIC AEB=5CC ANA=7CC   Culture NO GROWTH 1 DAY     Report Status PENDING    TROPONIN I     Status: None   Collection Time    11/27/13  1:28 AM      Result Value Ref Range   Troponin I <0.30  <0.30 ng/mL   Comment:            Due to the release kinetics of cTnI,     a negative result within the first hours     of the onset of symptoms does not rule out     myocardial infarction with certainty.     If myocardial infarction is still suspected,     repeat the test at appropriate intervals.  POCT I-STAT TROPONIN I     Status: None   Collection Time    11/27/13  2:07  AM      Result Value Ref Range   Troponin i, poc 0.06  0.00 - 0.08 ng/mL   Comment 3            Comment: Due to the release kinetics of cTnI,     a negative result within the first hours     of the onset of symptoms does not rule out     myocardial infarction with certainty.     If myocardial infarction is still suspected,     repeat the test at appropriate intervals.  I-STAT CG4 LACTIC ACID, ED     Status: Abnormal   Collection Time    11/27/13  2:10 AM      Result Value Ref Range   Lactic Acid, Venous 4.57 (*) 0.5 - 2.2 mmol/L  INFLUENZA PANEL BY PCR (TYPE A & B, H1N1)     Status: None   Collection Time    11/27/13  2:20 AM      Result Value Ref Range   Influenza A By PCR NEGATIVE  NEGATIVE   Influenza B By PCR NEGATIVE  NEGATIVE   H1N1 flu by pcr NOT DETECTED  NOT DETECTED   Comment:            The Xpert Flu assay (FDA approved for     nasal aspirates or washes and     nasopharyngeal swab specimens), is     intended as an aid in the diagnosis of     influenza and should not be used as     a sole basis for treatment.  MRSA PCR SCREENING     Status: None   Collection Time    11/27/13  3:32 AM      Result Value Ref Range   MRSA by PCR NEGATIVE  NEGATIVE   Comment:            The GeneXpert MRSA Assay (FDA     approved for NASAL specimens     only), is one component of a     comprehensive MRSA colonization     surveillance program. It is not     intended to diagnose MRSA     infection nor to guide or  monitor treatment for     MRSA infections.  URINE CULTURE     Status: None   Collection Time    11/27/13  5:23 AM      Result Value Ref Range   Specimen Description URINE, CLEAN CATCH     Special Requests NONE     Culture  Setup Time       Value: 11/27/2013 10:30     Performed at [facility]       Value: 20,OOO COLONIES/ML     Performed at [facility]   Culture       Value: ENTEROCOCCUS SPECIES     Performed at [facility]   Report Status PENDING    URINALYSIS, ROUTINE W REFLEX MICROSCOPIC     Status: Abnormal   Collection Time    11/27/13  7:09 AM      Result Value Ref Range   Color, Urine YELLOW  YELLOW   APPearance CLEAR  CLEAR   Specific Gravity, Urine 1.025  1.005 - 1.030   pH 5.5  5.0 - 8.0   Glucose, UA >1000 (*) NEGATIVE mg/dL   Hgb urine dipstick SMALL (*) NEGATIVE   Bilirubin Urine NEGATIVE  NEGATIVE   Ketones, ur TRACE (*) NEGATIVE mg/dL   Protein, ur 30 (*) NEGATIVE mg/dL   Urobilinogen, UA 0.2  0.0 - 1.0 mg/dL   Nitrite NEGATIVE  NEGATIVE   Leukocytes, UA NEGATIVE  NEGATIVE  URINE MICROSCOPIC-ADD ON     Status: None   Collection Time    11/27/13  7:09 AM      Result Value Ref Range   Squamous Epithelial / LPF RARE  RARE   RBC / HPF 3-6  <3 RBC/hpf  TROPONIN I     Status: Abnormal   Collection Time    11/27/13  8:53 AM      Result Value Ref Range   Troponin I 3.31 (*) <0.30 ng/mL   Comment: CRITICAL RESULT CALLED TO, READ BACK BY AND VERIFIED WITH:     SPANGLER,E AT 9:45AM ON 11/27/13 BY FESTERMAN,C                Due to the release kinetics of cTnI,     a negative result within the first hours     of the onset of symptoms does not rule out     myocardial infarction with certainty.     If myocardial infarction is still suspected,     repeat the test at appropriate intervals.  TROPONIN I     Status: Abnormal   Collection Time    11/27/13  2:50 PM      Result Value Ref Range   Troponin I 15.61 (*) <0.30 ng/mL   Comment:            Due to the release kinetics of cTnI,     a negative result within the first hours     of the onset of symptoms does not rule out     myocardial infarction with certainty.     If myocardial infarction is still suspected,     repeat the test at appropriate intervals.     CRITICAL VALUE NOTED.  VALUE IS CONSISTENT WITH PREVIOUSLY REPORTED AND CALLED VALUE.  TROPONIN I     Status: Abnormal   Collection Time    11/27/13  8:27 PM      Result  Value Ref Range   Troponin I 15.23 (*) <  0.30 ng/mL   Comment:            Due to the release kinetics of cTnI,     a negative result within the first hours     of the onset of symptoms does not rule out     myocardial infarction with certainty.     If myocardial infarction is still suspected,     repeat the test at appropriate intervals.     CRITICAL VALUE NOTED.  VALUE IS CONSISTENT WITH PREVIOUSLY REPORTED AND CALLED VALUE.  BASIC METABOLIC PANEL     Status: Abnormal   Collection Time    11/28/13  4:38 AM      Result Value Ref Range   Sodium 143  137 - 147 mEq/L   Potassium 3.6 (*) 3.7 - 5.3 mEq/L   Comment: DELTA CHECK NOTED   Chloride 106  96 - 112 mEq/L   CO2 25  19 - 32 mEq/L   Glucose, Bld 157 (*) 70 - 99 mg/dL   BUN 25 (*) 6 - 23 mg/dL   Creatinine, Ser 1.71 (*) 0.50 - 1.35 mg/dL   Calcium 8.5  8.4 - 10.5 mg/dL   GFR calc non Af Amer 36 (*) >90 mL/min   GFR calc Af Amer 42 (*) >90 mL/min   Comment: (NOTE)     The eGFR has been calculated using the CKD EPI equation.     This calculation has not been validated in all clinical situations.     eGFR's persistently <90 mL/min signify possible Chronic Kidney     Disease.  CBC WITH DIFFERENTIAL     Status: Abnormal   Collection Time    11/28/13  4:38 AM      Result Value Ref Range   WBC 1.4 (*) 4.0 - 10.5 K/uL   Comment: RESULT REPEATED AND VERIFIED     WHITE COUNT CONFIRMED ON SMEAR     CRITICAL RESULT CALLED TO, READ BACK BY AND VERIFIED WITH:     NADINE ROWE RN ON [id number] AT 0720 BY RESSEGGER R   RBC 2.95 (*) 4.22 - 5.81 MIL/uL   Hemoglobin 9.1 (*) 13.0 - 17.0 g/dL   HCT 27.6 (*) 39.0 - 52.0 %   MCV 93.6  78.0 - 100.0 fL   MCH 30.8  26.0 - 34.0 pg   MCHC 33.0  30.0 - 36.0 g/dL   RDW 18.6 (*) 11.5 - 15.5 %   Platelets 41 (*) 150 - 400 K/uL   Comment: SPECIMEN CHECKED FOR CLOTS     PLATELET COUNT CONFIRMED BY SMEAR   Neutrophils Relative % 59  43 - 77 %   Neutro Abs 0.9 (*) 1.7 - 7.7 K/uL   Lymphocytes Relative 20   12 - 46 %   Lymphs Abs 0.3 (*) 0.7 - 4.0 K/uL   Monocytes Relative 8  3 - 12 %   Monocytes Absolute 0.1  0.1 - 1.0 K/uL   Eosinophils Relative 9 (*) 0 - 5 %   Eosinophils Absolute 0.1  0.0 - 0.7 K/uL   Basophils Relative 4 (*) 0 - 1 %   Basophils Absolute 0.1  0.0 - 0.1 K/uL   Smear Review PLATELETS APPEAR DECREASED        RADIOGRAPHY: Dg Chest Portable 1 View  11/27/2013   CLINICAL DATA:  Shortness of breath  EXAM: PORTABLE CHEST - 1 VIEW  COMPARISON:  January 05, 2013  FINDINGS: Diffuse interstitial coarsening, mildly asymmetric to the right. Stable heart  size and mediastinal contours. Changes of previous CABG. No significant effusion. No pneumothorax.  IMPRESSION: 1. Mild pulmonary edema. 2. Asymmetric opacification at the right base. Please ensure no pulmonary infectious symptoms to suggest pneumonia.   Electronically Signed   By: Jorje Guild M.D.   On: 11/27/2013 02:02       ASSESSMENT:  1. Pancytopenia, secondary to 5q- minus MDS plus acute medical issues including HCAP and NSTEMI versus progression of  Uuderlying MDSdisease. 2. NSTEMI, with elevated troponin.  Followed by Cardiology 3. HCAP, on antibiotics.  4. 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  Became completely transfusion independent with last outpatient transfusion on 01/16/2013.   5. Acute respiratory failure, on bipap 6. Hypokalemia   PLAN:  1. I personally reviewed and went over laboratory results with the patient.  The results are noted within this dictation. 2. I personally reviewed and went over radiographic studies with the patient.  The results are noted within this dictation.   3. Chart reviewed 4. Labs ordered: SPEP with IFE, light chain assay, LDH, and B2M 5. The patient's acute pancytopenia is likely secondary to 5q- MDS in addition to his acute hospital issues including HCAP and NSTEMI.  The patient has a dysplastic bone marrow  and now with current acute medical issues, bone marrow is more suppressed.  However, progression/transformation of disease remains on the differential.  As a result, labs are ordered including SPEP with IFE, LDH, light chain assay, and beta-2 microglobulin.  If progression of disease is not identified, and the patient recovers, then Revlimid can be restarted at full dose (10 mg daily).  At this time, it is wise to hold Revlimid as the medication can be prothrombogenic.  As an outpatient, Avyon was on ASA which is thought to be protective from Revlimid-induced thrombogenicity.  However, given the patient's present situation, Revlimid is contraindicated. 6. Recommend platelet transfusion for platelet count 10,000 or less and/or active bleeding. 7. Recommend PRBC transfusion to maintain a Hgb of 9 g/dL given patient's cardiac issues.  8. Will follow along as an inpatient.   All questions were answered. The patient knows to call the clinic with any problems, questions or concerns. We can certainly see the patient much sooner if necessary.  Patient and plan discussed with Dr. Farrel Gobble and he is in agreement with the aforementioned.    KEFALAS,THOMAS

## 2013-11-28 NOTE — Progress Notes (Addendum)
Consulting cardiologist: Dr. Satira Sark  Subjective:    Patient on BiPAP this morning, still has cough, noted to desaturate when he comes off the mask. Also now febrile. States that his chest tightness has improved.  Objective:   Temp:  [97.2 F (36.2 C)-102.6 F (39.2 C)] 102.6 F (39.2 C) (03/04 0730) Pulse Rate:  [25-82] 80 (03/04 0730) Resp:  [15-34] 29 (03/04 0730) BP: (104-151)/(40-109) 142/51 mmHg (03/04 0730) SpO2:  [86 %-100 %] 94 % (03/04 0730) FiO2 (%):  [55 %-60 %] 55 % (03/04 0420) Weight:  [201 lb 8 oz (91.4 kg)] 201 lb 8 oz (91.4 kg) (03/04 0500) Last BM Date: 11/26/13  Filed Weights   11/27/13 0037 11/27/13 0315 11/28/13 0500  Weight: 195 lb (88.451 kg) 197 lb 12 oz (89.7 kg) 201 lb 8 oz (91.4 kg)    Intake/Output Summary (Last 24 hours) at 11/28/13 0801 Last data filed at 11/28/13 0500  Gross per 24 hour  Intake    450 ml  Output   1850 ml  Net  -1400 ml    Telemetry: Normal sinus rhythm.  Exam:  General: Chronically ill-appearing male on BiPAP.  Lungs: Scattered rhonchi and crackles throughout with upper airway congestion.  Cardiac: RRR, no gallop. Soft systolic murmur.  Abdomen: NABS.  Extremities: Trace edema.   Lab Results:  Basic Metabolic Panel:  Recent Labs Lab 11/27/13 0110 11/28/13 0438  NA 141 143  K 2.3* 3.6*  CL 98 106  CO2 23 25  GLUCOSE 260* 157*  BUN 16 25*  CREATININE 1.79* 1.71*  CALCIUM 8.8 8.5    CBC:  Recent Labs Lab 11/27/13 0110 11/28/13 0438  WBC 1.7* 1.4*  HGB 10.5* 9.1*  HCT 31.8* 27.6*  MCV 92.7 93.6  PLT 39* 41*    Cardiac Enzymes:  Recent Labs Lab 11/27/13 0853 11/27/13 1450 11/27/13 2027  TROPONINI 3.31* 15.61* 15.23*    BNP:  Recent Labs  01/05/13 0806 11/27/13 0110  PROBNP 1994.0* 1952.0*    ECG: Normal sinus rhythm with nonspecific ST-T changes.   Medications:   Scheduled Medications: . amiodarone  100 mg Oral Daily  . amitriptyline  30 mg Oral QHS  .  aspirin EC  81 mg Oral BID  . ceFEPime (MAXIPIME) IV  2 g Intravenous Q12H  . furosemide  40 mg Intravenous Daily  . ipratropium-albuterol  3 mL Nebulization Q4H  . isosorbide mononitrate  30 mg Oral Daily  . metoprolol tartrate  25 mg Oral BID  . pregabalin  75 mg Oral Daily  . tamsulosin  0.4 mg Oral QPC supper  . vancomycin  1,250 mg Intravenous Q24H      PRN Medications:  acetaminophen, diazepam, morphine injection, nitroGLYCERIN   Assessment:   1. NSTEMI, troponin I has increased to 15. Followup ECG in the absence of chest pain shows nonspecific ST-T changes. Expect that he likely has graft disease.  2. Possible community-acquired pneumonia, now febrile, potentially right basilar infiltrate by chest x-ray. Has acute hypoxic respiratory failure requiring BiPAP.  3. Neutropenic, ANC less than 1000.  2. Multivessel CAD status post CABG in 1996. Reportedly last ischemic workup at 2010 was negative.   3. Cardiomyopathy, LVEF 45-50% as of April 2014.   4. Mild aortic stenosis.   5. Paroxysmal atrial fibrillation. Patient has been on amiodarone at low dose long term. He has not been anticoagulated with prior history of progressive anemia, concurrent myelodysplastic syndrome. He is on low-dose aspirin.   6.  COPD.   7. Type 2 diabetes mellitus.   8. Myelodysplastic syndrome, recent WBC 1.4, hemoglobin 9.1, platelets 41.  9. Hypokalemia, improving with repletion.  10. Acute on chronic renal insufficiency, creatinine 1.7.    Plan/Discussion:    Complex situation with multiorgan involvement. I have discussed cardiac status with Dr. Luan Pulling, and also reviewed the case with one of our interventionaliists Dr. Burt Knack. Plan is to continue medical therapy from a cardiac perspective, not pursue heparin infusion or invasive cardiac evaluation in light of active comorbidities and overall risk. Cardiac regimen includes aspirin, low-dose amiodarone, Lasix, Lopressor, and Imdur. Followup  echocardiogram is pending for reassessment of LVEF. In light of his multisystem complexity, may want to consider transferring him to the critical care service at [facility]. Overall poor prognosis.   Satira Sark, M.D., F.A.C.C.

## 2013-11-29 ENCOUNTER — Ambulatory Visit (HOSPITAL_COMMUNITY): Payer: Medicare Other

## 2013-11-29 DIAGNOSIS — D469 Myelodysplastic syndrome, unspecified: Secondary | ICD-10-CM

## 2013-11-29 DIAGNOSIS — C9590 Leukemia, unspecified not having achieved remission: Secondary | ICD-10-CM

## 2013-11-29 LAB — URINE CULTURE

## 2013-11-29 LAB — CBC WITH DIFFERENTIAL/PLATELET
BASOS ABS: 0 10*3/uL (ref 0.0–0.1)
BASOS PCT: 4 % — AB (ref 0–1)
Eosinophils Absolute: 0.1 10*3/uL (ref 0.0–0.7)
Eosinophils Relative: 5 % (ref 0–5)
HCT: 26.9 % — ABNORMAL LOW (ref 39.0–52.0)
Hemoglobin: 8.8 g/dL — ABNORMAL LOW (ref 13.0–17.0)
LYMPHS PCT: 45 % (ref 12–46)
Lymphs Abs: 0.5 10*3/uL — ABNORMAL LOW (ref 0.7–4.0)
MCH: 30.9 pg (ref 26.0–34.0)
MCHC: 32.7 g/dL (ref 30.0–36.0)
MCV: 94.4 fL (ref 78.0–100.0)
Monocytes Absolute: 0.1 10*3/uL (ref 0.1–1.0)
Monocytes Relative: 6 % (ref 3–12)
NEUTROS ABS: 0.4 10*3/uL — AB (ref 1.7–7.7)
Neutrophils Relative %: 41 % — ABNORMAL LOW (ref 43–77)
PLATELETS: 30 10*3/uL — AB (ref 150–400)
RBC: 2.85 MIL/uL — ABNORMAL LOW (ref 4.22–5.81)
RDW: 18.3 % — AB (ref 11.5–15.5)
WBC: 1 10*3/uL — AB (ref 4.0–10.5)

## 2013-11-29 LAB — KAPPA/LAMBDA LIGHT CHAINS
KAPPA FREE LGHT CHN: 2.46 mg/dL — AB (ref 0.33–1.94)
Kappa, lambda light chain ratio: 0.6 (ref 0.26–1.65)
LAMDA FREE LIGHT CHAINS: 4.1 mg/dL — AB (ref 0.57–2.63)

## 2013-11-29 LAB — BASIC METABOLIC PANEL
BUN: 31 mg/dL — ABNORMAL HIGH (ref 6–23)
CALCIUM: 8.3 mg/dL — AB (ref 8.4–10.5)
CO2: 27 mEq/L (ref 19–32)
CREATININE: 1.88 mg/dL — AB (ref 0.50–1.35)
Chloride: 104 mEq/L (ref 96–112)
GFR, EST AFRICAN AMERICAN: 38 mL/min — AB (ref >90)
GFR, EST NON AFRICAN AMERICAN: 32 mL/min — AB (ref >90)
Glucose, Bld: 186 mg/dL — ABNORMAL HIGH (ref 70–99)
Potassium: 3.1 mEq/L — ABNORMAL LOW (ref 3.7–5.3)
Sodium: 143 mEq/L (ref 137–147)

## 2013-11-29 LAB — BETA 2 MICROGLOBULIN, SERUM: BETA 2 MICROGLOBULIN: 4.08 mg/L — AB (ref 1.01–1.73)

## 2013-11-29 LAB — PREPARE RBC (CROSSMATCH)

## 2013-11-29 MED ORDER — SODIUM CHLORIDE 0.9 % IV SOLN: 250.0000 mL | Freq: Once | INTRAVENOUS | Status: AC

## 2013-11-29 MED ORDER — HEPARIN SOD (PORK) LOCK FLUSH 100 UNIT/ML IV SOLN: 500.0000 [IU] | Freq: Every day | INTRAVENOUS | Status: DC | PRN

## 2013-11-29 MED ORDER — BIOTENE DRY MOUTH MT LIQD
15.0000 mL | Freq: Two times a day (BID) | OROMUCOSAL | Status: DC
  Administered 2013-11-29 – 2013-12-08 (×10): 15 mL via OROMUCOSAL

## 2013-11-29 MED ORDER — SODIUM CHLORIDE 0.9 % IJ SOLN: 3.0000 mL | INTRAMUSCULAR | Status: DC | PRN

## 2013-11-29 MED ORDER — AMIODARONE HCL 200 MG PO TABS
200.0000 mg | ORAL_TABLET | Freq: Two times a day (BID) | ORAL | Status: DC
  Administered 2013-11-29: 200 mg via ORAL
  Administered 2013-11-29: 100 mg via ORAL
  Administered 2013-11-30 – 2013-12-08 (×17): 200 mg via ORAL
  Filled 2013-11-29 (×19): qty 1

## 2013-11-29 MED ORDER — CHLORHEXIDINE GLUCONATE 0.12 % MT SOLN
15.0000 mL | Freq: Two times a day (BID) | OROMUCOSAL | Status: DC
  Administered 2013-11-29 – 2013-12-02 (×8): 15 mL via OROMUCOSAL
  Filled 2013-11-29 (×8): qty 15

## 2013-11-29 MED ORDER — SODIUM CHLORIDE 0.9 % IJ SOLN: 10.0000 mL | INTRAMUSCULAR | Status: DC | PRN

## 2013-11-29 MED ORDER — POTASSIUM CHLORIDE 20 MEQ/15ML (10%) PO LIQD
20.0000 meq | Freq: Every day | ORAL | Status: DC
  Administered 2013-11-29 – 2013-12-07 (×9): 20 meq via ORAL
  Filled 2013-11-29 (×9): qty 30

## 2013-11-29 NOTE — Progress Notes (Signed)
Consulting cardiologist: Dr. Satira Sark  Subjective:    Getting bath this morning. Somewhat less short of breath, appears more comfortable. Had some chest pain getting up to the bedside commode.  Objective:   Temp:  [98.1 F (36.7 C)-102.9 F (39.4 C)] 100.4 F (38 C) (03/05 0730) Pulse Rate:  [79-149] 116 (03/05 0759) Resp:  [18-37] 37 (03/05 0759) BP: (80-152)/(36-62) 124/54 mmHg (03/05 0730) SpO2:  [88 %-100 %] 98 % (03/05 0821) FiO2 (%):  [50 %-55 %] 50 % (03/05 0821) Weight:  [202 lb 9.6 oz (91.9 kg)] 202 lb 9.6 oz (91.9 kg) (03/05 0500) Last BM Date: 11/26/13  Filed Weights   11/27/13 0315 11/28/13 0500 11/29/13 0500  Weight: 197 lb 12 oz (89.7 kg) 201 lb 8 oz (91.4 kg) 202 lb 9.6 oz (91.9 kg)    Intake/Output Summary (Last 24 hours) at 11/29/13 1015 Last data filed at 11/29/13 0500  Gross per 24 hour  Intake     40 ml  Output    975 ml  Net   -935 ml    Telemetry: Sinus rhythm with paroxysmal atrial fibrillation - some RVR.  Exam:  General: Chronically ill-appearing male on face mask O2.  Lungs: Scattered rhonchi and crackles throughout with upper airway congestion.  Cardiac: RRR with ectopy, no gallop. Soft systolic murmur.  Abdomen: NABS.  Extremities: Trace edema.   Lab Results:  Basic Metabolic Panel:  Recent Labs Lab 11/27/13 0110 11/28/13 0438 11/29/13 0420  NA 141 143 143  K 2.3* 3.6* 3.1*  CL 98 106 104  CO2 23 25 27   GLUCOSE 260* 157* 186*  BUN 16 25* 31*  CREATININE 1.79* 1.71* 1.88*  CALCIUM 8.8 8.5 8.3*    CBC:  Recent Labs Lab 11/27/13 0110 11/28/13 0438 11/29/13 0420  WBC 1.7* 1.4* 1.0*  HGB 10.5* 9.1* 8.8*  HCT 31.8* 27.6* 26.9*  MCV 92.7 93.6 94.4  PLT 39* 41* 30*    Cardiac Enzymes:  Recent Labs Lab 11/27/13 0853 11/27/13 1450 11/27/13 2027  TROPONINI 3.31* 15.61* 15.23*    BNP:  Recent Labs  01/05/13 0806 11/27/13 0110  PROBNP 1994.0* 1952.0*    Echocardiogram (3/4): Study  Conclusions  - Left ventricle: The cavity size was normal. Wall thickness was increased in a pattern of moderate LVH. Systolic function was normal. The estimated ejection fraction was in the range of 55% to 60%. There is hypokinesis of the basalinferior myocardium. Features are consistent with a pseudonormal left ventricular filling pattern, with concomitant abnormal relaxation and increased filling pressure (grade 2 diastolic dysfunction). - Aortic valve: Mildly calcified annulus. Trileaflet; moderately calcified leaflets. There was moderate stenosis. Mean gradient: 44mm Hg (S). Peak gradient: 18mm Hg (S). Valve area: 1.02cm^2(VTI). LVOT/AV VTI ratio 0.29. - Mitral valve: Calcified annulus. Mildly thickened leaflets . Mild regurgitation. - Left atrium: The atrium was moderately dilated. - Right ventricle: The cavity size was mildly dilated. - Right atrium: The atrium was moderately dilated. Central venous pressure: 79mm Hg (est). - Atrial septum: No defect or patent foramen ovale was identified. - Tricuspid valve: Mild regurgitation. - Pulmonary arteries: PA peak pressure: 40mm Hg (S). - Pericardium, extracardiac: There was no pericardial effusion. Impressions:  - Moderate LVH with LVEF 55-60%, basal inferior hypokinesis, grade 2 diastolic dysfunction. Moderate biatrial enlargement. MAC with thickened mitral valve and mild mitral regurgitation. Overall moderate aortic valve stenosis as described above. Mild tricuspid regurgitation with moderately elevated PASP of 48 mmHg. mercury.   Medications:  Scheduled Medications: . amiodarone  100 mg Oral Daily  . amitriptyline  30 mg Oral QHS  . antiseptic oral rinse  15 mL Mouth Rinse q12n4p  . aspirin EC  81 mg Oral BID  . ceFEPime (MAXIPIME) IV  2 g Intravenous Q12H  . chlorhexidine  15 mL Mouth Rinse BID  . furosemide  40 mg Intravenous Daily  . ipratropium-albuterol  3 mL Nebulization Q4H  . isosorbide mononitrate  30 mg  Oral Daily  . metoprolol tartrate  25 mg Oral BID  . pregabalin  75 mg Oral Daily  . tamsulosin  0.4 mg Oral QPC supper  . vancomycin  1,250 mg Intravenous Q24H     PRN Medications: acetaminophen, diazepam, ibuprofen, morphine injection, nitroGLYCERIN   Assessment:   1. NSTEMI, troponin I has increased to 15. Expect that he likely has graft disease, also in the setting of increased physiologic demand. LVEF 55% with inferior basal hypokinesis.  2. Possible community-acquired pneumonia, now febrile, potentially right basilar infiltrate by chest x-ray. Has acute hypoxic respiratory failure requiring BiPAP.   3. Neutropenic, ANC less than 1000.  2. Multivessel CAD status post CABG in 1996. Reportedly last ischemic workup at 2010 was negative.   3. Cardiomyopathy, LVEF 55-60% this point.   4. Moderate aortic stenosis.   5. Paroxysmal atrial fibrillation. Patient has been on amiodarone at low dose long term. He has not been anticoagulated with prior history of progressive anemia, concurrent myelodysplastic syndrome. He is on low-dose aspirin.   6. COPD.   7. Type 2 diabetes mellitus.   8. Myelodysplastic syndrome. Patient has been seen by hematology.  9. Hypokalemia. Needs repletion.  10. Acute on chronic renal insufficiency, creatinine 1.6.    Plan/Discussion:    Complex situation with multiorgan involvement. I have discussed cardiac status with Dr. Luan Pulling, and also reviewed the case with one of our interventionaliists Dr. Burt Knack. Plan is to continue medical therapy from a cardiac perspective, not pursue heparin infusion or invasive cardiac evaluation in light of active comorbidities and overall risk. He seems to be somewhat improved clinically, although did have some recurrent chest pain. He is having some PAF with RVR. Continue aspirin, Lasix, Imdur, Lopressor. Temporarily increase amiodarone to 200 mg twice daily. Followup ECG in the morning. As noted by hematology, may want  to consider a packed red cell transfusion to get hemoglobin at least up over 9 as this may help with his oxygen-carrying capacity.   Satira Sark, M.D., F.A.C.C.

## 2013-11-29 NOTE — Progress Notes (Signed)
[id number] 

## 2013-11-29 NOTE — Progress Notes (Signed)
Subjective: Patient seen in bed.  He is in good spirits.  No family at the bedside.  I personally reviewed and went over laboratory results with the patient.  The results are noted within this dictation.  Will transfuse 2 units of PRBCs.   He feels better today.    He denies any complaints today.  Objective: Vital signs in last 24 hours: Temp:  [98.1 F (36.7 C)-101.9 F (38.8 C)] 99 F (37.2 C) (03/05 1143) Pulse Rate:  [25-149] 75 (03/05 1500) Resp:  [18-37] 26 (03/05 1500) BP: (80-143)/(36-95) 106/49 mmHg (03/05 1500) SpO2:  [70 %-100 %] 92 % (03/05 1541) FiO2 (%):  [50 %-55 %] 50 % (03/05 1130) Weight:  [202 lb 9.6 oz (91.9 kg)] 202 lb 9.6 oz (91.9 kg) (03/05 0500)  Intake/Output from previous day: 03/04 0800 - 03/05 0759 In: 390 [IV Piggyback:350] Out: 975 [Urine:975] Intake/Output this shift: Total I/O In: 50 [IV Piggyback:50] Out: 700 [Urine:700]  General appearance: alert and cooperative  Lab Results:   Recent Labs  11/28/13 0438 11/29/13 0420  WBC 1.4* 1.0*  HGB 9.1* 8.8*  HCT 27.6* 26.9*  PLT 41* 30*   BMET  Recent Labs  11/28/13 0438 11/29/13 0420  NA 143 143  K 3.6* 3.1*  CL 106 104  CO2 25 27  GLUCOSE 157* 186*  BUN 25* 31*  CREATININE 1.71* 1.88*  CALCIUM 8.5 8.3*    Studies/Results: No results found.  Medications: I have reviewed the patient's current medications.  Assessment/Plan: 1. 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 Became completely transfusion independent with last outpatient transfusion on 01/16/2013.  Kappa lambda light chains are elevated, but ratio is WNL.  LDH is WNL as well.  SPEP and IFE are pending.      Myelodysplastic syndrome with 5 q minus   09/05/2012 Bone Marrow Biopsy 5 q- MDS   09/19/2012 - 12/15/2012 Chemotherapy Approximate start date of Revlimid 5 mg 21 days on and 7 days off   12/16/2012 - 07/18/2013 Chemotherapy Dose increased  to 10 mg Revlimid 21 days on and 7 days off   07/19/2013 -  Chemotherapy Dose increased to 10 mg daily    2. 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 Will order peripheral smear for pathology review. 3. NSTEMI, with elevated troponin. Followed by Cardiology 4. HCAP, on antibiotics.  5. Will transfuse 2 units of PRBCs (no special requirements).  No lasix ordered as 40 mg daily is already ordered.   6. Recommend PRBC transfusion to maintain a Hgb of 9 g/dL given patient's cardiac issues.  7. Recommend platelet transfusion for a platelet count of 10,000 or less and/or active bleeding.  8. HOLD REVLIMID as the medication can be prothrombogenic.  Given the patient's present situation, Revlimid is contraindicated. 9. Will follow as an inpatient.  Please call the [facility] [phone]) for any issues as PA-C will be out of the office 3-6 through 3-9.  Dr. Barnet Glasgow will be available for any issues related to the patient's care.  Patient and plan discussed with Dr. Farrel Gobble and he is in agreement with the aforementioned.     LOS: 2 days    [name],[name] 11/29/2013

## 2013-11-29 NOTE — Progress Notes (Signed)
Inpatient Diabetes Program Recommendations  AACE/ADA: New Consensus Statement on Inpatient Glycemic Control (2013)  Target Ranges:  Prepandial:   less than 140 mg/dL      Peak postprandial:   less than 180 mg/dL (1-2 hours)      Critically ill patients:  140 - 180 mg/dL   Results for [name], [name] (MRN [id number]) as of 11/29/2013 07:41  Ref. Range 11/27/2013 01:10 11/28/2013 04:38 11/29/2013 04:20  Glucose Latest Range: 70-99 mg/dL 260 (H) 157 (H) 186 (H)   Diabetes history: DM2  Outpatient Diabetes medications: Metformin 500 mg BID  Current orders for Inpatient glycemic control: None  Inpatient Diabetes Program Recommendations Correction (SSI): While inpatient, please consider ordering CBGs with Novolog correction scale ACHS.  Thanks, Barnie Alderman, RN, MSN, CCRN Diabetes Coordinator Inpatient Diabetes Program [phone] (Team Pager) [phone] ([facility] office) [phone] [facility] office)

## 2013-11-29 NOTE — Progress Notes (Deleted)
Consulting cardiologist: Rozann Lesches MD Primary Cardiologist: Dorris Carnes MD  Subjective:      Objective:   Temp:  [98.1 F (36.7 C)-102.9 F (39.4 C)] 100.4 F (38 C) (03/05 0730) Pulse Rate:  [79-149] 116 (03/05 0759) Resp:  [18-37] 37 (03/05 0759) BP: (80-152)/(36-62) 124/54 mmHg (03/05 0730) SpO2:  [88 %-100 %] 98 % (03/05 0821) FiO2 (%):  [50 %-55 %] 50 % (03/05 0821) Weight:  [202 lb 9.6 oz (91.9 kg)] 202 lb 9.6 oz (91.9 kg) (03/05 0500) Last BM Date: 11/26/13  Filed Weights   11/27/13 0315 11/28/13 0500 11/29/13 0500  Weight: 197 lb 12 oz (89.7 kg) 201 lb 8 oz (91.4 kg) 202 lb 9.6 oz (91.9 kg)    Intake/Output Summary (Last 24 hours) at 11/29/13 1003 Last data filed at 11/29/13 0500  Gross per 24 hour  Intake     40 ml  Output    975 ml  Net   -935 ml    Telemetry:  Exam:  General: No acute distress.  HEENT: Conjunctiva and lids normal, oropharynx clear.  Lungs: Clear to auscultation, nonlabored.  Cardiac: No elevated JVP or bruits. RRR, no gallop or rub.   Abdomen: Normoactive bowel sounds, nontender, nondistended.  Extremities: No pitting edema, distal pulses full.  Neuropsychiatric: Alert and oriented x3, affect appropriate.  Echocardiogram 11/28/2013 Left ventricle: The cavity size was normal. Wall thickness was increased in a pattern of moderate LVH. Systolic function was normal. The estimated ejection fraction was in the range of 55% to 60%. There is hypokinesis of the basalinferior myocardium. Features are consistent with a pseudonormal left ventricular filling pattern, with concomitant abnormal relaxation and increased filling pressure (grade 2 diastolic dysfunction). - Aortic valve: Mildly calcified annulus. Trileaflet; moderately calcified leaflets. There was moderate stenosis. Mean gradient: 67mm Hg (S). Peak gradient: 2mm Hg (S). Valve area: 1.02cm^2(VTI). LVOT/AV VTI ratio 0.29. - Mitral valve: Calcified annulus. Mildly  thickened leaflets . Mild regurgitation. - Left atrium: The atrium was moderately dilated. - Right ventricle: The cavity size was mildly dilated. - Right atrium: The atrium was moderately dilated. Central venous pressure: 37mm Hg (est). - Atrial septum: No defect or patent foramen ovale was identified. - Tricuspid valve: Mild regurgitation. - Pulmonary arteries: PA peak pressure: 28mm Hg (S). - Pericardium, extracardiac: There was no pericardial effusion.   Lab Results:  Basic Metabolic Panel:  Recent Labs Lab 11/27/13 0110 11/28/13 0438 11/29/13 0420  NA 141 143 143  K 2.3* 3.6* 3.1*  CL 98 106 104  CO2 23 25 27   GLUCOSE 260* 157* 186*  BUN 16 25* 31*  CREATININE 1.79* 1.71* 1.88*  CALCIUM 8.8 8.5 8.3*    CBC:  Recent Labs Lab 11/27/13 0110 11/28/13 0438 11/29/13 0420  WBC 1.7* 1.4* 1.0*  HGB 10.5* 9.1* 8.8*  HCT 31.8* 27.6* 26.9*  MCV 92.7 93.6 94.4  PLT 39* 41* 30*    Cardiac Enzymes:  Recent Labs Lab 11/27/13 0853 11/27/13 1450 11/27/13 2027  TROPONINI 3.31* 15.61* 15.23*    BNP:  Recent Labs  01/05/13 0806 11/27/13 0110  PROBNP 1994.0* 1952.0*    Radiology: CXR 11/27/2013 1. Mild pulmonary edema. 2. Asymmetric opacification at the right base. Please ensure no pulmonary infectious symptoms to suggest pneumonia.    ECG:   Medications:   Scheduled Medications: . amiodarone  100 mg Oral Daily  . amitriptyline  30 mg Oral QHS  . antiseptic oral rinse  15 mL Mouth Rinse q12n4p  .  aspirin EC  81 mg Oral BID  . ceFEPime (MAXIPIME) IV  2 g Intravenous Q12H  . chlorhexidine  15 mL Mouth Rinse BID  . furosemide  40 mg Intravenous Daily  . ipratropium-albuterol  3 mL Nebulization Q4H  . isosorbide mononitrate  30 mg Oral Daily  . metoprolol tartrate  25 mg Oral BID  . pregabalin  75 mg Oral Daily  . tamsulosin  0.4 mg Oral QPC supper  . vancomycin  1,250 mg Intravenous Q24H    Infusions:    PRN Medications: acetaminophen,  diazepam, ibuprofen, morphine injection, nitroGLYCERIN   Assessment and Plan:     [name]. Purcell Nails NP [facility] 11/29/2013, 10:03 AM

## 2013-11-30 LAB — BASIC METABOLIC PANEL
BUN: 32 mg/dL — ABNORMAL HIGH (ref 6–23)
CALCIUM: 8.2 mg/dL — AB (ref 8.4–10.5)
CHLORIDE: 101 meq/L (ref 96–112)
CO2: 27 mEq/L (ref 19–32)
Creatinine, Ser: 2.15 mg/dL — ABNORMAL HIGH (ref 0.50–1.35)
GFR calc Af Amer: 32 mL/min — ABNORMAL LOW (ref >90)
GFR calc non Af Amer: 28 mL/min — ABNORMAL LOW (ref >90)
GLUCOSE: 161 mg/dL — AB (ref 70–99)
Potassium: 3.1 mEq/L — ABNORMAL LOW (ref 3.7–5.3)
Sodium: 140 mEq/L (ref 137–147)

## 2013-11-30 LAB — CBC WITH DIFFERENTIAL/PLATELET
BASOS PCT: 2 % — AB (ref 0–1)
Basophils Absolute: 0 10*3/uL (ref 0.0–0.1)
Eosinophils Absolute: 0.1 10*3/uL (ref 0.0–0.7)
Eosinophils Relative: 7 % — ABNORMAL HIGH (ref 0–5)
HCT: 27.9 % — ABNORMAL LOW (ref 39.0–52.0)
Hemoglobin: 9.5 g/dL — ABNORMAL LOW (ref 13.0–17.0)
Lymphocytes Relative: 37 % (ref 12–46)
Lymphs Abs: 0.5 10*3/uL — ABNORMAL LOW (ref 0.7–4.0)
MCH: 30.9 pg (ref 26.0–34.0)
MCHC: 34.1 g/dL (ref 30.0–36.0)
MCV: 90.9 fL (ref 78.0–100.0)
MONO ABS: 0.1 10*3/uL (ref 0.1–1.0)
Monocytes Relative: 7 % (ref 3–12)
NEUTROS ABS: 0.6 10*3/uL — AB (ref 1.7–7.7)
NEUTROS PCT: 47 % (ref 43–77)
Platelets: 27 10*3/uL — CL (ref 150–400)
RBC: 3.07 MIL/uL — ABNORMAL LOW (ref 4.22–5.81)
RDW: 18.7 % — ABNORMAL HIGH (ref 11.5–15.5)
WBC: 1.4 10*3/uL — CL (ref 4.0–10.5)

## 2013-11-30 LAB — VANCOMYCIN, TROUGH: Vancomycin Tr: 23.8 ug/mL — ABNORMAL HIGH (ref 10.0–20.0)

## 2013-11-30 LAB — PROTEIN ELECTROPHORESIS, SERUM
Albumin ELP: 51 % — ABNORMAL LOW (ref 55.8–66.1)
Alpha-1-Globulin: 7.9 % — ABNORMAL HIGH (ref 2.9–4.9)
Alpha-2-Globulin: 13.7 % — ABNORMAL HIGH (ref 7.1–11.8)
BETA 2: 7.5 % — AB (ref 3.2–6.5)
Beta Globulin: 6.3 % (ref 4.7–7.2)
Gamma Globulin: 13.6 % (ref 11.1–18.8)
M-Spike, %: NOT DETECTED g/dL
TOTAL PROTEIN ELP: 5.9 g/dL — AB (ref 6.0–8.3)

## 2013-11-30 LAB — PATHOLOGIST SMEAR REVIEW

## 2013-11-30 LAB — IMMUNOFIXATION ELECTROPHORESIS
IGG (IMMUNOGLOBIN G), SERUM: 790 mg/dL (ref 650–1600)
IgA: 482 mg/dL — ABNORMAL HIGH (ref 68–379)
IgM, Serum: 47 mg/dL — ABNORMAL LOW (ref 41–251)
Total Protein ELP: 6 g/dL (ref 6.0–8.3)

## 2013-11-30 LAB — MAGNESIUM: Magnesium: 1.8 mg/dL (ref 1.5–2.5)

## 2013-11-30 MED ORDER — VANCOMYCIN HCL IN DEXTROSE 750-5 MG/150ML-% IV SOLN
750.0000 mg | INTRAVENOUS | Status: DC
  Administered 2013-12-01 – 2013-12-02 (×2): 750 mg via INTRAVENOUS
  Filled 2013-11-30 (×3): qty 150

## 2013-11-30 MED ORDER — POTASSIUM CHLORIDE 10 MEQ/100ML IV SOLN
10.0000 meq | INTRAVENOUS | Status: AC
  Administered 2013-11-30 (×4): 10 meq via INTRAVENOUS
  Filled 2013-11-30 (×4): qty 100

## 2013-11-30 MED ORDER — FUROSEMIDE 10 MG/ML IJ SOLN
40.0000 mg | Freq: Once | INTRAMUSCULAR | Status: AC
  Administered 2013-11-30: 40 mg via INTRAVENOUS
  Filled 2013-11-30: qty 4

## 2013-11-30 MED ORDER — DEXTROSE 5 % IV SOLN
2.0000 g | INTRAVENOUS | Status: DC
  Administered 2013-12-01 – 2013-12-08 (×8): 2 g via INTRAVENOUS
  Filled 2013-11-30 (×10): qty 2

## 2013-11-30 MED ORDER — METHYLPREDNISOLONE SODIUM SUCC 125 MG IJ SOLR
80.0000 mg | Freq: Three times a day (TID) | INTRAMUSCULAR | Status: DC
  Administered 2013-11-30 – 2013-12-02 (×8): 80 mg via INTRAVENOUS
  Filled 2013-11-30 (×8): qty 2

## 2013-11-30 NOTE — Care Management Note (Addendum)
    Page 1 of 2   12/07/2013     1:05:29 PM   CARE MANAGEMENT NOTE 12/07/2013  Patient:  [name], [name]   Account Number:  [id number]  Date Initiated:  11/27/2013  Documentation initiated by:  Theophilus Kinds  Subjective/Objective Assessment:   Pt admitted from home with pneumonia and NSTEMI. Pt lives with his wife and will return home at discharge. Pt uses a cane and walker prn. Pt requires moderate assistance with ADL's.     Action/Plan:   Pts wife choose [facility] for RN, PT, and aide at discharge. Romualdo Bolk of [facility] is aware and will collect pts information from the chart. If pt discharges over the weekend, staff will arrange.   Anticipated DC Date:  12/03/2013   Anticipated DC Plan:  [place]  In-house referral  Clinical Social Worker      DC Planning Services  CM consult      [facility] Choice  HOME HEALTH   Choice offered to / List presented to:  C-3 Spouse        HH arranged  HH-1 RN  [place].   Status of service:  Completed, signed off Medicare Important Message given?   (If response is "NO", the following Medicare IM given date fields will be blank) Date Medicare IM given:   Date Additional Medicare IM given:    Discharge Disposition:  [place]  Per UR Regulation:    If discussed at Long Length of Stay Meetings, dates discussed:   12/04/2013    Comments:  12/07/13 Claretha Cooper RN BSN CM DC planned for 3/14 to [facility]  11/30/13 [place], RN BSN CM

## 2013-11-30 NOTE — Progress Notes (Signed)
CRITICAL VALUE ALERT  Critical value received:Platelets 27, WBC 1.4  Date of notification: 11/30/2013  Time of notification:  11:10 AM  Critical value read back:yes  Nurse who received alert:  Schonewitz, Eulis Canner  MD notified (1st page): [email or web address]  Time of first page: 0730  MD notified (2nd page):  Time of second page:  Responding MD: Cindie Laroche  Time MD responded:  [phone]

## 2013-11-30 NOTE — Progress Notes (Signed)
Called report to Leroy Kennedy, RN.  Verbalized understanding. Pt transferred to rm [id number] in safe and stable condition.  Granddaughter notified of move. Schonewitz, Eulis Canner 11/30/2013

## 2013-11-30 NOTE — Progress Notes (Signed)
[place] for Vancomycin  & Cefepime  Indication: pneumonia  Allergies  Allergen Reactions  . Statins Other (See Comments)    Causes legs to hurt.   . Tape     Causes skin to peel off.     Patient Measurements: Height: 6' (182.9 cm) Weight: 199 lb 4.7 oz (90.4 kg) IBW/kg (Calculated) : 77.6 Adjusted Body Weight: 81kg  Vital Signs: Temp: 97.9 F (36.6 C) (03/06 2036) Temp src: Oral (03/06 2036) BP: 116/52 mmHg (03/06 2036) Pulse Rate: 74 (03/06 2036) Intake/Output from previous day: 03/05 0701 - 03/06 0700 In: 1234.6 [P.O.:120; Blood:764.6; IV Piggyback:350] Out: 2175 [Urine:2175] Intake/Output from this shift:    Labs:  Recent Labs  11/28/13 0438 11/29/13 0420 11/30/13 0438 11/30/13 0500  WBC 1.4* 1.0*  --  1.4*  HGB 9.1* 8.8*  --  9.5*  PLT 41* 30*  --  27*  CREATININE 1.71* 1.88* 2.15*  --    Estimated Creatinine Clearance: 30.6 ml/min (by C-G formula based on Cr of 2.15).  Recent Labs  11/30/13 1924  VANCOTROUGH 23.8*     Microbiology: Recent Results (from the past 720 hour(s))  CULTURE, BLOOD (ROUTINE X 2)     Status: None   Collection Time    11/27/13  1:10 AM      Result Value Ref Range Status   Specimen Description BLOOD RIGHT ANTECUBITAL   Final   Special Requests BOTTLES DRAWN AEROBIC AND ANAEROBIC 6CC EACH   Final   Culture NO GROWTH 3 DAYS   Final   Report Status PENDING   Incomplete  CULTURE, BLOOD (ROUTINE X 2)     Status: None   Collection Time    11/27/13  1:10 AM      Result Value Ref Range Status   Specimen Description BLOOD RIGHT ANTECUBITAL   Final   Special Requests     Final   Value: BOTTLES DRAWN AEROBIC AND ANAEROBIC AEB=5CC ANA=7CC   Culture NO GROWTH 3 DAYS   Final   Report Status PENDING   Incomplete  MRSA PCR SCREENING     Status: None   Collection Time    11/27/13  3:32 AM      Result Value Ref Range Status   MRSA by PCR NEGATIVE  NEGATIVE Final   Comment:            The GeneXpert  MRSA Assay (FDA     approved for NASAL specimens     only), is one component of a     comprehensive MRSA colonization     surveillance program. It is not     intended to diagnose MRSA     infection nor to guide or     monitor treatment for     MRSA infections.  URINE CULTURE     Status: None   Collection Time    11/27/13  5:23 AM      Result Value Ref Range Status   Specimen Description URINE, CLEAN CATCH   Final   Special Requests NONE   Final   Culture  Setup Time     Final   Value: 11/27/2013 10:30     Performed at [facility] Count     Final   Value: 20,OOO COLONIES/ML     Performed at [facility]   Culture     Final   Value: ENTEROCOCCUS SPECIES     Performed at [facility]  Report Status 11/29/2013 FINAL   Final   Organism ID, Bacteria ENTEROCOCCUS SPECIES   Final    Medical History: Past Medical History  Diagnosis Date  . Essential hypertension, benign   . COPD (chronic obstructive pulmonary disease)   . Coronary atherosclerosis of native coronary artery     Multivessel status post CABG 1996  . Arthritis   . Atrial fibrillation     Coumadin stopped 12/2011 due to anemia  . Dysphagia   . Anemia     Requiring blood transfusions  . Carotid stenosis     Dr Scot Dock   . Diabetes mellitus, type 2   . Mixed hyperlipidemia     Statin intolerant  . Hematuria     Felt related to Foley insertion  . Cardiomyopathy     LVEF 45-50%  . Melena     EGD & Colonoscopy 09/2011 revealed gastritis, erosions, scars, diverticula, 8 colon polyps (largest 1.6cm, not removed 2/2 anticoagulation), small AVM in transverse colon  . History of tobacco abuse     Quit 1992  . Anxiety   . GERD (gastroesophageal reflux disease)   . Myocardial infarction   . Thrombocytopenia   . Myelodysplastic syndrome with 5 q minus 10/17/2012    On Revlimid 5 mg daily 21 days on and 7 days off  . Aortic stenosis     Mild  . CKD (chronic kidney disease) stage 3, GFR  30-59 ml/min     Medications:  Scheduled:  . amiodarone  200 mg Oral BID  . amitriptyline  30 mg Oral QHS  . antiseptic oral rinse  15 mL Mouth Rinse q12n4p  . aspirin EC  81 mg Oral BID  . ceFEPime (MAXIPIME) IV  2 g Intravenous Q12H  . chlorhexidine  15 mL Mouth Rinse BID  . ipratropium-albuterol  3 mL Nebulization Q4H  . isosorbide mononitrate  30 mg Oral Daily  . methylPREDNISolone sodium succinate  80 mg Intravenous 3 times per day  . metoprolol tartrate  25 mg Oral BID  . potassium chloride  20 mEq Oral Daily  . pregabalin  75 mg Oral Daily  . tamsulosin  0.4 mg Oral QPC supper  . vancomycin  1,250 mg Intravenous Q24H   Infusions:    PRN:   Assessment: 80yo M who continues on empiric, broad-spectrum antibiotics for HCAP.    Hosital course complicated by acute pancytopenia and NSTEMI.   Renal function is declining.   Vancomycin trough is elevated.    Vancomycin 3/3>> Cefepime 3/3>>  Goal of Therapy:  Vancomycin trough 15-86mcg/ml   Plan:  Decrease Vancomycin 750mg  IV q24h Decrease Cefepime 2gm IV q24h Repeat Vancomycin trough level at steady state Monitor renal function and cx data    Biagio Borg 11/30/2013,9:17 PM

## 2013-11-30 NOTE — Progress Notes (Signed)
[name] NO.:  [id number]  MEDICAL RECORD NO.:  [id number]  LOCATION:  IC07                          FACILITY:  [facility]  PHYSICIAN:  Unk Lightning, MDDATE OF BIRTH:  [birth year]  DATE OF PROCEDURE: DATE OF DISCHARGE:                                PROGRESS NOTE   A 80 year old white male acute who has acute non-STEMI with associated healthcare acquired pneumonia, COPD, currently on BiPAP.  He likewise has myelodysplastic syndrome with leukopenia and anemia.  Currently in AFib, ventricular response about 105.  His Lopressor was held last night due to hypertension which responded to 3 boluses of fluid resuscitation. Currently, systolic 414/23.  Currently afebrile.  Lungs show diminished breath sounds at the bases.  Scattered rhonchi.  Prolonged expiratory phase.  No wheeze, no rales audible.  Echocardiogram reveals inferobasilar hypokinesis globally ejection fraction 55%, moderate aortic stenosis.  WBC is 1.4, hemoglobin 9.1, platelets 41, creatinine was 1.7 yesterday.  Awaiting this morning's labs.  PLAN:  Right now is to resume Lopressor 25 p.o. b.i.d. for ventricular rate control.  Continue his amiodarone.  He has multivessel CAD, status post CABG was in 1996.  Currently controlled on vancomycin and cefepime for hospital acquired pneumonia.  Hemodynamically stable at present. Presume beta blockade.  Make further recommendations as the database expands.     Unk Lightning, MD     RMD/MEDQ  D:  11/29/2013  T:  11/30/2013  Job:  [id number]

## 2013-11-30 NOTE — Progress Notes (Signed)
Consulting cardiologist: Dr. Satira Sark  Subjective:    Looks much better this morning. No chest pain. States he did have some chest congestion and shortness of breath last night, got better with breathing treatments. He is not on any supplemental oxygen now.  Objective:   Temp:  [97.1 F (36.2 C)-99.5 F (37.5 C)] 97.1 F (36.2 C) (03/06 0800) Pulse Rate:  [25-103] 76 (03/06 0800) Resp:  [17-31] 18 (03/06 0800) BP: (91-143)/(29-95) 113/44 mmHg (03/06 0800) SpO2:  [70 %-100 %] 93 % (03/06 0800) FiO2 (%):  [50 %] 50 % (03/05 1130) Weight:  [199 lb 4.7 oz (90.4 kg)] 199 lb 4.7 oz (90.4 kg) (03/06 0500) Last BM Date: 11/26/13  Filed Weights   11/28/13 0500 11/29/13 0500 11/30/13 0500  Weight: 201 lb 8 oz (91.4 kg) 202 lb 9.6 oz (91.9 kg) 199 lb 4.7 oz (90.4 kg)    Intake/Output Summary (Last 24 hours) at 11/30/13 3810 Last data filed at 11/30/13 0515  Gross per 24 hour  Intake 1234.58 ml  Output   2175 ml  Net -940.42 ml    Telemetry: Sinus rhythm with PACs.  Exam:  General: Appears comfortable this morning.  Lungs: Scattered rhonchi and decreased breath throughout.  Cardiac: RRR with ectopy, no gallop. Soft systolic murmur.  Abdomen: NABS.  Extremities: Trace edema.   Lab Results:  Basic Metabolic Panel:  Recent Labs Lab 11/28/13 0438 11/29/13 0420 11/30/13 0438 11/30/13 0500  NA 143 143 140  --   K 3.6* 3.1* 3.1*  --   CL 106 104 101  --   CO2 25 27 27   --   GLUCOSE 157* 186* 161*  --   BUN 25* 31* 32*  --   CREATININE 1.71* 1.88* 2.15*  --   CALCIUM 8.5 8.3* 8.2*  --   MG  --   --   --  1.8    CBC:  Recent Labs Lab 11/28/13 0438 11/29/13 0420 11/30/13 0500  WBC 1.4* 1.0* 1.4*  HGB 9.1* 8.8* 9.5*  HCT 27.6* 26.9* 27.9*  MCV 93.6 94.4 90.9  PLT 41* 30* 27*    Cardiac Enzymes:  Recent Labs Lab 11/27/13 0853 11/27/13 1450 11/27/13 2027  TROPONINI 3.31* 15.61* 15.23*    BNP:  Recent Labs  01/05/13 0806  11/27/13 0110  PROBNP 1994.0* 1952.0*    Echocardiogram (3/4): Study Conclusions  - Left ventricle: The cavity size was normal. Wall thickness was increased in a pattern of moderate LVH. Systolic function was normal. The estimated ejection fraction was in the range of 55% to 60%. There is hypokinesis of the basalinferior myocardium. Features are consistent with a pseudonormal left ventricular filling pattern, with concomitant abnormal relaxation and increased filling pressure (grade 2 diastolic dysfunction). - Aortic valve: Mildly calcified annulus. Trileaflet; moderately calcified leaflets. There was moderate stenosis. Mean gradient: 71mm Hg (S). Peak gradient: 11mm Hg (S). Valve area: 1.02cm^2(VTI). LVOT/AV VTI ratio 0.29. - Mitral valve: Calcified annulus. Mildly thickened leaflets . Mild regurgitation. - Left atrium: The atrium was moderately dilated. - Right ventricle: The cavity size was mildly dilated. - Right atrium: The atrium was moderately dilated. Central venous pressure: 22mm Hg (est). - Atrial septum: No defect or patent foramen ovale was identified. - Tricuspid valve: Mild regurgitation. - Pulmonary arteries: PA peak pressure: 41mm Hg (S). - Pericardium, extracardiac: There was no pericardial effusion. Impressions:  - Moderate LVH with LVEF 55-60%, basal inferior hypokinesis, grade 2 diastolic dysfunction. Moderate biatrial enlargement. MAC  with thickened mitral valve and mild mitral regurgitation. Overall moderate aortic valve stenosis as described above. Mild tricuspid regurgitation with moderately elevated PASP of 48 mmHg. mercury.   Medications:   Scheduled Medications: . amiodarone  200 mg Oral BID  . amitriptyline  30 mg Oral QHS  . antiseptic oral rinse  15 mL Mouth Rinse q12n4p  . aspirin EC  81 mg Oral BID  . ceFEPime (MAXIPIME) IV  2 g Intravenous Q12H  . chlorhexidine  15 mL Mouth Rinse BID  . furosemide  40 mg Intravenous Daily  .  ipratropium-albuterol  3 mL Nebulization Q4H  . isosorbide mononitrate  30 mg Oral Daily  . methylPREDNISolone sodium succinate  80 mg Intravenous 3 times per day  . metoprolol tartrate  25 mg Oral BID  . potassium chloride  10 mEq Intravenous Q1 Hr x 4  . potassium chloride  20 mEq Oral Daily  . pregabalin  75 mg Oral Daily  . tamsulosin  0.4 mg Oral QPC supper  . vancomycin  1,250 mg Intravenous Q24H     PRN Medications: acetaminophen, diazepam, heparin lock flush, ibuprofen, morphine injection, nitroGLYCERIN, sodium chloride, sodium chloride   Assessment:   1. NSTEMI, troponin I increased to 15. Hemodynamically stable. Expect that he likely has graft disease, also in the setting of increased physiologic demand. LVEF 55% with inferior basal hypokinesis. Case discussed with Dr. Burt Knack regarding likely limited interventional options with increased risk in light of his comorbidities, and is to manage medically. Followup ECG reviewed.  2. Community-acquired pneumonia,, potentially right basilar infiltrate by chest x-ray.  3. Neutropenic, ANC less than 1000. On broad-spectrum antibiotics with above infection.  2. Multivessel CAD status post CABG in 1996. Reportedly last ischemic workup at 2010 was negative.   3. Cardiomyopathy, LVEF 55-60% at this point.   4. Moderate aortic stenosis.   5. Paroxysmal atrial fibrillation. Patient has been on amiodarone at low dose long term. He has not been anticoagulated with prior history of progressive anemia, concurrent myelodysplastic syndrome. He is on low-dose aspirin.   6. COPD.   7. Type 2 diabetes mellitus.   8. Myelodysplastic syndrome. Patient has been seen by hematology.  9. Hypokalemia. Needs repletion.  10. Acute on chronic renal insufficiency, creatinine up to 2.1.   Plan/Discussion:    Continue aspirin, Imdur, Lopressor, and temporarily increased dose of amiodarone at 200 mg twice daily. Would hold Lasix with rising  creatinine. Follow urine output determine if diuretic needs to be resumed. He potassium supplements for now. Otherwise supportive measures per primary team.  Satira Sark, M.D., F.A.C.C.

## 2013-11-30 NOTE — Progress Notes (Signed)
NAMEJOSEPHMICHAEL, LISENBEE NO.:  [id number]  MEDICAL RECORD NO.:  [id number]  LOCATION:  IC07                          FACILITY:  [facility]  PHYSICIAN:  Unk Lightning, MDDATE OF BIRTH:  October 12, 1931  DATE OF PROCEDURE: DATE OF DISCHARGE:                                PROGRESS NOTE   PROBLEMS:  As listed.  Myelodysplastic syndrome, status post 2 units of packed cells given yesterday, COPD, NSTEMI, echo with ejection fraction of 55-60% with inferobasal hypokinesis, moderate aortic stenosis, community-acquired pneumonia, currently on antibiotics, coronary artery disease, status post CABG, paroxysmal AFib, currently in AFib.  The patient complains of dyspnea with BiPAP mask, feels better without it. Currently on nasal O2.  Currently in sinus rhythm.  Blood pressure 125/78.  Given 2 units last night.  Lungs show bilateral mild inspiratory and expiratory wheezes.  Heart, regular rhythm.  No S3, S4 auscultated.  No heaves, thrills, or rubs.  PLAN:  Right now is to add Solu-Medrol 80 IV q.8h. for bronchospastic component.  Give Lasix 40 mg IV.  Continue to replenish potassium 10 mEq 4 runs today.  Monitor serum magnesium today and continue supportive care with dual antibiotics of cefepime and vancomycin.     Unk Lightning, MD     RMD/MEDQ  D:  11/30/2013  T:  11/30/2013  Job:  [id number]

## 2013-11-30 NOTE — Progress Notes (Signed)
[id number] 

## 2013-12-01 ENCOUNTER — Inpatient Hospital Stay (HOSPITAL_COMMUNITY): Payer: Medicare Other

## 2013-12-01 LAB — BASIC METABOLIC PANEL
BUN: 46 mg/dL — AB (ref 6–23)
CO2: 25 mEq/L (ref 19–32)
Calcium: 8.8 mg/dL (ref 8.4–10.5)
Chloride: 97 mEq/L (ref 96–112)
Creatinine, Ser: 2.44 mg/dL — ABNORMAL HIGH (ref 0.50–1.35)
GFR, EST AFRICAN AMERICAN: 27 mL/min — AB (ref >90)
GFR, EST NON AFRICAN AMERICAN: 24 mL/min — AB (ref >90)
Glucose, Bld: 387 mg/dL — ABNORMAL HIGH (ref 70–99)
POTASSIUM: 3.7 meq/L (ref 3.7–5.3)
SODIUM: 137 meq/L (ref 137–147)

## 2013-12-01 LAB — TYPE AND SCREEN
ABO/RH(D): A NEG
Antibody Screen: NEGATIVE
UNIT DIVISION: 0
Unit division: 0

## 2013-12-01 MED ORDER — BISACODYL 5 MG PO TBEC
5.0000 mg | DELAYED_RELEASE_TABLET | Freq: Every day | ORAL | Status: DC | PRN
  Administered 2013-12-01 – 2013-12-07 (×4): 5 mg via ORAL
  Filled 2013-12-01 (×4): qty 1

## 2013-12-01 MED ORDER — ONDANSETRON HCL 4 MG/2ML IJ SOLN
4.0000 mg | INTRAMUSCULAR | Status: DC | PRN
  Administered 2013-12-01 – 2013-12-02 (×2): 4 mg via INTRAVENOUS
  Filled 2013-12-01: qty 2

## 2013-12-01 MED ORDER — ONDANSETRON HCL 4 MG/2ML IJ SOLN
INTRAMUSCULAR | Status: AC
  Filled 2013-12-01: qty 2

## 2013-12-01 MED ORDER — POLYETHYLENE GLYCOL 3350 17 G PO PACK
17.0000 g | PACK | Freq: Every day | ORAL | Status: DC | PRN
  Administered 2013-12-02 – 2013-12-08 (×4): 17 g via ORAL
  Filled 2013-12-01 (×5): qty 1

## 2013-12-01 NOTE — Progress Notes (Signed)
[name], [name] NO.:  [id number]  MEDICAL RECORD NO.:  [id number]  LOCATION:  A326                          FACILITY:  [facility]  PHYSICIAN:  Unk Lightning, MDDATE OF BIRTH:  Jan 12, 1932  DATE OF PROCEDURE: DATE OF DISCHARGE:                                PROGRESS NOTE   SUBJECTIVE:  A 80 year old white male with a healthcare-associated pneumonia, currently treated with vanc and cefepime as well as DuoNeb nebulizer.  Likewise, he has myelodysplastic syndrome with progressive leukopenia, anemia, and thrombocytopenia.  Hemoglobin had dipped from 11.6-9.5, despite transfusion of 2 units of packed cells 48 hours previously.  On WBCs on January 8 with 3.5, now currently 1.7, platelets down from 86,000 to 27,000, no evidence of bleed clinically.  The patient has 2D echo revealing ejection fraction 55-60% with hypokinesis of the inferobasal segment with moderate aortic stenosis valve area of 1.02 cm2, grade 2 diastolic dysfunction.  The patient had NSTEMI. Troponins are currently negative.  Multivessel CAD, CABG in 1996, and history of paroxysmal atrial fibrillation.  EKG of November 30, 2013, reveals regular sinus rhythm of APCs.  Nonspecific ST abnormalities. The patient had bronchospastic worsening yesterday, started on Solu- Medrol 80 IV q.8 hours, continuing on dual antibiotics.  OBJECTIVE:  VITAL SIGNS:  Blood pressure is 112/78. GENERAL:  Hemodynamically stable.  He is currently afebrile. LUNGS:  Mild-to-moderate inspiratory and expiratory wheeze, scattered rhonchi.  Diminished breath sounds in the bases. HEART:  Regular rhythm.  No S3, S4 auscultated.  No heaves, thrills, or rubs.  PLAN:  Right now is to get 2-view chest x-ray, PA and lateral to assess progress of infiltrates.  CBC and BMET in a.m. to monitor pancytopenia and myelodysplastic syndrome.  We will obtain this data and further recommendations as the database expands.     Unk Lightning, MD     RMD/MEDQ  D:  12/01/2013  T:  12/01/2013  Job:  [id number]

## 2013-12-01 NOTE — Progress Notes (Signed)
[id number] 

## 2013-12-02 LAB — CULTURE, BLOOD (ROUTINE X 2)
CULTURE: NO GROWTH
CULTURE: NO GROWTH

## 2013-12-02 LAB — BASIC METABOLIC PANEL
BUN: 60 mg/dL — ABNORMAL HIGH (ref 6–23)
CHLORIDE: 95 meq/L — AB (ref 96–112)
CO2: 27 mEq/L (ref 19–32)
CREATININE: 2.71 mg/dL — AB (ref 0.50–1.35)
Calcium: 9.1 mg/dL (ref 8.4–10.5)
GFR calc Af Amer: 24 mL/min — ABNORMAL LOW (ref >90)
GFR, EST NON AFRICAN AMERICAN: 21 mL/min — AB (ref >90)
Glucose, Bld: 478 mg/dL — ABNORMAL HIGH (ref 70–99)
Potassium: 4.1 mEq/L (ref 3.7–5.3)
Sodium: 136 mEq/L — ABNORMAL LOW (ref 137–147)

## 2013-12-02 LAB — CREATININE, URINE, RANDOM: Creatinine, Urine: 40.84 mg/dL

## 2013-12-02 LAB — SODIUM, URINE, RANDOM: Sodium, Ur: 26 mEq/L

## 2013-12-02 LAB — PHOSPHORUS: Phosphorus: 4 mg/dL (ref 2.3–4.6)

## 2013-12-02 LAB — GLUCOSE, CAPILLARY
Glucose-Capillary: 507 mg/dL — ABNORMAL HIGH (ref 70–99)
Glucose-Capillary: 413 mg/dL — ABNORMAL HIGH (ref 70–99)
Glucose-Capillary: 365 mg/dL — ABNORMAL HIGH (ref 70–99)

## 2013-12-02 LAB — PROTEIN, URINE, RANDOM: Total Protein, Urine: 22 mg/dL

## 2013-12-02 MED ORDER — SODIUM CHLORIDE 0.9 % IV SOLN
INTRAVENOUS | Status: DC
  Administered 2013-12-02 – 2013-12-08 (×8): via INTRAVENOUS

## 2013-12-02 MED ORDER — PREDNISONE 20 MG PO TABS
20.0000 mg | ORAL_TABLET | Freq: Every day | ORAL | Status: DC
  Administered 2013-12-03 – 2013-12-08 (×6): 20 mg via ORAL
  Filled 2013-12-02 (×6): qty 1

## 2013-12-02 MED ORDER — FUROSEMIDE 10 MG/ML IJ SOLN
40.0000 mg | Freq: Two times a day (BID) | INTRAMUSCULAR | Status: DC
  Administered 2013-12-02 – 2013-12-03 (×2): 40 mg via INTRAVENOUS
  Filled 2013-12-02 (×2): qty 4

## 2013-12-02 MED ORDER — INSULIN ASPART 100 UNIT/ML ~~LOC~~ SOLN
0.0000 [IU] | SUBCUTANEOUS | Status: DC
Start: 2013-12-02 — End: 2013-12-08
  Administered 2013-12-02: 15 [IU] via SUBCUTANEOUS
  Administered 2013-12-03: 11 [IU] via SUBCUTANEOUS
  Administered 2013-12-03: 15 [IU] via SUBCUTANEOUS
  Administered 2013-12-03: 11 [IU] via SUBCUTANEOUS
  Administered 2013-12-03: 5 [IU] via SUBCUTANEOUS
  Administered 2013-12-03: 8 [IU] via SUBCUTANEOUS
  Administered 2013-12-04: 5 [IU] via SUBCUTANEOUS
  Administered 2013-12-04: 8 [IU] via SUBCUTANEOUS
  Administered 2013-12-04 – 2013-12-05 (×5): 5 [IU] via SUBCUTANEOUS
  Administered 2013-12-05: 3 [IU] via SUBCUTANEOUS
  Administered 2013-12-05: 11 [IU] via SUBCUTANEOUS
  Administered 2013-12-05: 3 [IU] via SUBCUTANEOUS
  Administered 2013-12-05: 5 [IU] via SUBCUTANEOUS
  Administered 2013-12-05: 3 [IU] via SUBCUTANEOUS
  Administered 2013-12-06: 2 [IU] via SUBCUTANEOUS
  Administered 2013-12-06: 3 [IU] via SUBCUTANEOUS
  Administered 2013-12-06: 5 [IU] via SUBCUTANEOUS
  Administered 2013-12-06: 3 [IU] via SUBCUTANEOUS
  Administered 2013-12-07: 2 [IU] via SUBCUTANEOUS
  Administered 2013-12-07: 3 [IU] via SUBCUTANEOUS
  Administered 2013-12-07: 5 [IU] via SUBCUTANEOUS
  Administered 2013-12-07: 2 [IU] via SUBCUTANEOUS
  Administered 2013-12-08: 3 [IU] via SUBCUTANEOUS
  Administered 2013-12-08 (×2): 2 [IU] via SUBCUTANEOUS
  Administered 2013-12-08: 3 [IU] via SUBCUTANEOUS

## 2013-12-02 MED ORDER — INSULIN ASPART 100 UNIT/ML ~~LOC~~ SOLN
0.0000 [IU] | SUBCUTANEOUS | Status: DC
  Administered 2013-12-02: 9 [IU] via SUBCUTANEOUS

## 2013-12-02 MED ORDER — INSULIN ASPART 100 UNIT/ML ~~LOC~~ SOLN
20.0000 [IU] | Freq: Once | SUBCUTANEOUS | Status: AC
  Administered 2013-12-02: 20 [IU] via SUBCUTANEOUS

## 2013-12-02 MED ORDER — DEXTROSE 5 % IV SOLN
500.0000 mg | INTRAVENOUS | Status: DC
  Filled 2013-12-02 (×2): qty 500

## 2013-12-02 NOTE — Consult Note (Signed)
[name] MRN: [id number] DOB/AGE: [birth year] 80 y.o. Primary Care Physician:HAWKINS,EDWARD L, MD Admit date: 11/27/2013 Chief Complaint:  Chief Complaint  Patient presents with  . Shortness of Breath   HPI: Pt is 80 year old male with past medical hx of CAD who presented to Er with c/o dyspnea.  HPI dates back on 11/27/13 when pt had sudden onset of dyspnea and came to ER. In Er pt was evaluated and was found to have pneumonia and was admitted for further care. Pt was also thought to have CHF and was also initiated lasix and  BiPAP at that time. Pt later was diagnosed with NSTEMI Pt today feels better NO c/o chest pain  No c/o nausea/ vomiting  No c/o abdominal pain  No c/o hematuria Pt only c/o hiccups.    Past Medical History  Diagnosis Date  . Essential hypertension, benign   . COPD (chronic obstructive pulmonary disease)   . Coronary atherosclerosis of native coronary artery     Multivessel status post CABG 1996  . Arthritis   . Atrial fibrillation     Coumadin stopped 12/2011 due to anemia  . Dysphagia   . Anemia     Requiring blood transfusions  . Carotid stenosis     Dr Scot Dock   . Diabetes mellitus, type 2   . Mixed hyperlipidemia     Statin intolerant  . Hematuria     Felt related to Foley insertion  . Cardiomyopathy     LVEF 45-50%  . Melena     EGD & Colonoscopy 09/2011 revealed gastritis, erosions, scars, diverticula, 8 colon polyps (largest 1.6cm, not removed 2/2 anticoagulation), small AVM in transverse colon  . History of tobacco abuse     Quit 1992  . Anxiety   . GERD (gastroesophageal reflux disease)   . Myocardial infarction   . Thrombocytopenia   . Myelodysplastic syndrome with 5 q minus 10/17/2012    On Revlimid 5 mg daily 21 days on and 7 days off  . Aortic stenosis     Mild  . CKD (chronic kidney disease) stage 3, GFR 30-59 ml/min        Family History  Problem Relation Age of Onset  . Heart disease Sister   . Cancer Sister   .  Hyperlipidemia Sister   . Hypertension Sister   . Heart disease Brother     Heart Diseast before age 46  . Coronary artery disease Mother   . Cancer Father     Stomach  . Anesthesia problems Neg Hx   . Hypotension Neg Hx   . Malignant hyperthermia Neg Hx   . Pseudochol deficiency Neg Hx     Social History:  reports that he quit smoking about 23 years ago. His smoking use included Cigarettes. He has a 80 pack-year smoking history. He has never used smokeless tobacco. He reports that he does not drink alcohol or use illicit drugs.   Allergies:  Allergies  Allergen Reactions  . Statins Other (See Comments)    Causes legs to hurt.   . Tape     Causes skin to peel off.     Medications Prior to Admission  Medication Sig Dispense Refill  . acetaminophen (TYLENOL) 500 MG tablet Take 1 tablet (500 mg total) by mouth every 6 (six) hours as needed. Pain  30 tablet    . albuterol (PROVENTIL HFA;VENTOLIN HFA) 108 (90 BASE) MCG/ACT inhaler Inhale 2 puffs into the lungs every 6 (six) hours as needed  for wheezing.  1 Inhaler  2  . amiodarone (PACERONE) 200 MG tablet Take 0.5 tablets (100 mg total) by mouth daily.  30 tablet  5  . amitriptyline (ELAVIL) 10 MG tablet Take 30 mg by mouth at bedtime.      Marland Kitchen amLODipine (NORVASC) 5 MG tablet TAKE ONE TABLET BY MOUTH EVERY DAY FOR BLOOD PRESSURE  90 tablet  3  . aspirin EC 81 MG tablet Take 81 mg by mouth 2 (two) times daily.       . diazepam (VALIUM) 5 MG tablet Take 5 mg by mouth every 6 (six) hours as needed. Muscles Spasms      . furosemide (LASIX) 40 MG tablet Take 1 tablet (40 mg total) by mouth daily. Take one half tablet daily on a routine basis. If your weight goes up by 3 pounds or if you have edema of your legs take one full tablet twice a day for 3 days  30 tablet  11  . HYDROcodone-acetaminophen (NORCO) 5-325 MG per tablet Take 1 tablet by mouth every 6 (six) hours as needed. Pain      . lenalidomide (REVLIMID) 10 MG capsule Take 1 capsule  (10 mg total) by mouth daily.  28 capsule  0  . metFORMIN (GLUCOPHAGE) 500 MG tablet Take 500 mg by mouth 2 (two) times daily.       . metoprolol tartrate (LOPRESSOR) 25 MG tablet Take 25 mg by mouth 2 (two) times daily.        . Multiple Vitamin (MULTIVITAMIN) tablet Take 1 tablet by mouth daily.        . nitroGLYCERIN (NITROSTAT) 0.4 MG SL tablet Place 1 tablet (0.4 mg total) under the tongue every 5 (five) minutes x 3 doses as needed for chest pain (up to 3 doses).  25 tablet  3  . pregabalin (LYRICA) 75 MG capsule Take 75 mg by mouth daily.      . Red Yeast Rice 600 MG TABS Take 1 tablet by mouth daily.        . silodosin (RAPAFLO) 8 MG CAPS capsule Take 8 mg by mouth daily with breakfast.             XHB:ZJIRC from the symptoms mentioned above,there are no other symptoms referable to all systems reviewed.  Marland Kitchen amiodarone  200 mg Oral BID  . amitriptyline  30 mg Oral QHS  . antiseptic oral rinse  15 mL Mouth Rinse q12n4p  . aspirin EC  81 mg Oral BID  . azithromycin  500 mg Intravenous Q24H  . ceFEPime (MAXIPIME) IV  2 g Intravenous Q24H  . chlorhexidine  15 mL Mouth Rinse BID  . insulin aspart  0-9 Units Subcutaneous 6 times per day  . ipratropium-albuterol  3 mL Nebulization Q4H  . isosorbide mononitrate  30 mg Oral Daily  . methylPREDNISolone sodium succinate  80 mg Intravenous 3 times per day  . metoprolol tartrate  25 mg Oral BID  . potassium chloride  20 mEq Oral Daily  . pregabalin  75 mg Oral Daily  . tamsulosin  0.4 mg Oral QPC supper      Physical Exam: Vital signs in last 24 hours: Temp:  [97.7 F (36.5 C)-97.8 F (36.6 C)] 97.8 F (36.6 C) (03/08 1433) Pulse Rate:  [69-72] 69 (03/08 1433) Resp:  [20] 20 (03/08 1433) BP: (108-124)/(50-63) 124/50 mmHg (03/08 1433) SpO2:  [88 %-95 %] 94 % (03/08 1433) Weight:  [201 lb 1.6 oz (91.218 kg)] 201  lb 1.6 oz (91.218 kg) (03/08 0416) Weight change: 5 lb 12.8 oz (2.631 kg) Last BM Date: 11/26/13  Intake/Output from  previous day: 03/07 0701 - 03/08 0700 In: 1610 [P.O.:1340; IV Piggyback:50] Out: 71 [Urine:1550] Total I/O In: 200 [P.O.:200] Out: 950 [Urine:950]   Physical Exam: General- pt is awake,alert, oriented to time place and person Resp- No acute REsp distress, CTA B/L NO Rhonchi CVS- S1S2 regular in rate and rhythm GIT- BS+, soft, NT, ND EXT- NO LE Edema, Cyanosis CNS- CN 2-12 grossly intact. Moving all 4 extremities Psych- normal mood and affect    Lab Results: CBC  Recent Labs  11/30/13 0500  WBC 1.4*  HGB 9.5*  HCT 27.9*  PLT 27*    BMET  Recent Labs  12/01/13 0637 12/02/13 0608  NA 137 136*  K 3.7 4.1  CL 97 95*  CO2 25 27  GLUCOSE 387* 478*  BUN 46* 60*  CREATININE 2.44* 2.71*  CALCIUM 8.8 9.1   Creat Trend 2015  1.79==>2.71  2014   1.4--1.8 2013   1.2--1.6 2009    1.47    MICRO Recent Results (from the past 240 hour(s))  CULTURE, BLOOD (ROUTINE X 2)     Status: None   Collection Time    11/27/13  1:10 AM      Result Value Ref Range Status   Specimen Description BLOOD RIGHT ANTECUBITAL   Final   Special Requests BOTTLES DRAWN AEROBIC AND ANAEROBIC 6CC EACH   Final   Culture NO GROWTH 5 DAYS   Final   Report Status 12/02/2013 FINAL   Final  CULTURE, BLOOD (ROUTINE X 2)     Status: None   Collection Time    11/27/13  1:10 AM      Result Value Ref Range Status   Specimen Description BLOOD RIGHT ANTECUBITAL   Final   Special Requests     Final   Value: BOTTLES DRAWN AEROBIC AND ANAEROBIC AEB=5CC ANA=7CC   Culture NO GROWTH 5 DAYS   Final   Report Status 12/02/2013 FINAL   Final  MRSA PCR SCREENING     Status: None   Collection Time    11/27/13  3:32 AM      Result Value Ref Range Status   MRSA by PCR NEGATIVE  NEGATIVE Final   Comment:            The GeneXpert MRSA Assay (FDA     approved for NASAL specimens     only), is one component of a     comprehensive MRSA colonization     surveillance program. It is not     intended to  diagnose MRSA     infection nor to guide or     monitor treatment for     MRSA infections.  URINE CULTURE     Status: None   Collection Time    11/27/13  5:23 AM      Result Value Ref Range Status   Specimen Description URINE, CLEAN CATCH   Final   Special Requests NONE   Final   Culture  Setup Time     Final   Value: 11/27/2013 10:30     Performed at [facility] Count     Final   Value: 20,OOO COLONIES/ML     Performed at [facility]   Culture     Final   Value: ENTEROCOCCUS SPECIES     Performed at [facility]  Report Status 11/29/2013 FINAL   Final   Organism ID, Bacteria ENTEROCOCCUS SPECIES   Final      Lab Results  Component Value Date   CALCIUM 9.1 12/02/2013      Impression: 1)Renal  AKI secondary to  Multiple factors                  SIRS- admitted with Pneumonia                   Vanco  Toxicity                   NSAIDS                   NSTEMI                   Hypolemia- negative by 5 liters                CKD stage 3 .               CKD since 2009               CKD secondary to DM/ HTN/ Age asso decline/Post renal                 Progression of CKD marked with AKI                Proteinura will check.   2)HTN Target Organ damage  CKD CAD CHF  Medication- Was On Diuretics-Lasix/ Hctz On Beta blockers    3)Haem- Anemia Thrombocytopenia Neutropenia  Sec to MDS. Primary team and heam following   4)CKD Mineral-Bone Disorder PTH not avail . Secondary Hyperparathyroidism w/u pending Phosphorus will check.  5)CAD- pt has NSTEMI Cardiology and  Primary MD following  6)Electrolytes  Hypokalemic Being repleted Normonatremic   7)Acid base Co2 at goal     Plan:  Will d/c NSAIDS Will ask for FENA Will ask for renal u/s  Will start IVF Will start lasix  Will ask for CKD-BMD labs Will follow Bmet      Antonino Nienhuis S 12/02/2013, 3:47 PM

## 2013-12-02 NOTE — Progress Notes (Signed)
[name], [name]             ACCOUNT NO.:  [id number]  MEDICAL RECORD NO.:  [id number]  LOCATION:  A326                          FACILITY:  [facility]  PHYSICIAN:  Unk Lightning, MDDATE OF BIRTH:  03/31/1932  DATE OF PROCEDURE: DATE OF DISCHARGE:                                PROGRESS NOTE   A 80 year old white male, admitted with healthcare associated pneumonia, treated until this morning with vancomycin and cefepime as well as DuoNeb nebulizer, likewise has aortic stenosis, status post CABG x4 seven years ago, and was transfused with 2 units of packed cells 36 hours ago.  His creatinine has been increasing over the previous 3 days while on vancomycin and cefepime and is currently at 2.7.  Discussion with pharmacist and it was felt best to discontinue vancomycin, to continue cefepime, and I added Zithromax.  His chest x-ray performed yesterday revealed a left lower lobe infiltrate, perhaps a little more pronounced than on admission with resolution of CHF symptomatology is radiographically.  His blood pressure is 112/78.  He is afebrile.  No significant dyspnea.  He had been placed on Solu-Medrol 80 IV q.8 h. 36 hours ago for bronchospastic component that seems to be improved. Progression of his leukocytosis which is remaining stable at about 1.4 white cells.  His hemoglobin is steady, today at 9.5.  His platelets yesterday with 30,000, today is 27,000.  We have decided to discontinue vancomycin.  Add Zithromax empirically to the cefepime.  Continue nebulizer therapy and monitor myelodysplastic syndrome.  Hematology is on board.  We will see him tomorrow.  Likewise, I asked for Nephrology consult.     Unk Lightning, MD     RMD/MEDQ  D:  12/02/2013  T:  12/02/2013  Job:  [id number]

## 2013-12-02 NOTE — Progress Notes (Signed)
[id number] 

## 2013-12-03 ENCOUNTER — Inpatient Hospital Stay (HOSPITAL_COMMUNITY): Payer: Medicare Other

## 2013-12-03 LAB — GLUCOSE, CAPILLARY
GLUCOSE-CAPILLARY: 272 mg/dL — AB (ref 70–99)
Glucose-Capillary: 226 mg/dL — ABNORMAL HIGH (ref 70–99)
Glucose-Capillary: 357 mg/dL — ABNORMAL HIGH (ref 70–99)
Glucose-Capillary: 307 mg/dL — ABNORMAL HIGH (ref 70–99)
Glucose-Capillary: 343 mg/dL — ABNORMAL HIGH (ref 70–99)
Glucose-Capillary: 271 mg/dL — ABNORMAL HIGH (ref 70–99)

## 2013-12-03 LAB — BASIC METABOLIC PANEL
BUN: 80 mg/dL — ABNORMAL HIGH (ref 6–23)
CO2: 26 meq/L (ref 19–32)
Calcium: 8.9 mg/dL (ref 8.4–10.5)
Chloride: 97 mEq/L (ref 96–112)
Creatinine, Ser: 3.15 mg/dL — ABNORMAL HIGH (ref 0.50–1.35)
GFR calc Af Amer: 20 mL/min — ABNORMAL LOW (ref >90)
GFR, EST NON AFRICAN AMERICAN: 17 mL/min — AB (ref >90)
GLUCOSE: 280 mg/dL — AB (ref 70–99)
POTASSIUM: 3.8 meq/L (ref 3.7–5.3)
Sodium: 138 mEq/L (ref 137–147)

## 2013-12-03 LAB — CBC WITH DIFFERENTIAL/PLATELET
BASOS ABS: 0 10*3/uL (ref 0.0–0.1)
Basophils Relative: 0 % (ref 0–1)
EOS ABS: 0 10*3/uL (ref 0.0–0.7)
EOS PCT: 0 % (ref 0–5)
HCT: 30 % — ABNORMAL LOW (ref 39.0–52.0)
Hemoglobin: 9.9 g/dL — ABNORMAL LOW (ref 13.0–17.0)
Lymphocytes Relative: 17 % (ref 12–46)
Lymphs Abs: 0.2 10*3/uL — ABNORMAL LOW (ref 0.7–4.0)
MCH: 29.6 pg (ref 26.0–34.0)
MCHC: 33 g/dL (ref 30.0–36.0)
MCV: 89.8 fL (ref 78.0–100.0)
Monocytes Absolute: 0.1 10*3/uL (ref 0.1–1.0)
Monocytes Relative: 9 % (ref 3–12)
NEUTROS ABS: 0.9 10*3/uL — AB (ref 1.7–7.7)
Neutrophils Relative %: 74 % (ref 43–77)
Platelets: 31 10*3/uL — ABNORMAL LOW (ref 150–400)
RBC: 3.34 MIL/uL — ABNORMAL LOW (ref 4.22–5.81)
RDW: 17.5 % — ABNORMAL HIGH (ref 11.5–15.5)
WBC: 1.2 10*3/uL — CL (ref 4.0–10.5)

## 2013-12-03 LAB — PTH, INTACT AND CALCIUM
Calcium, Total (PTH): 8.5 mg/dL (ref 8.4–10.5)
PTH: 57.7 pg/mL (ref 14.0–72.0)

## 2013-12-03 MED ORDER — AZITHROMYCIN 250 MG PO TABS
500.0000 mg | ORAL_TABLET | Freq: Every day | ORAL | Status: DC
  Administered 2013-12-03 – 2013-12-08 (×6): 500 mg via ORAL
  Filled 2013-12-03 (×6): qty 2

## 2013-12-03 NOTE — Evaluation (Signed)
Physical Therapy Evaluation Patient Details Name: [name] MRN: [id number] DOB: 1932/08/16 Today's Date: 12/03/2013 Time: 1124-1200 PT Time Calculation (min): 36 min  PT Assessment / Plan / Recommendation History of Present Illness  Pt is admitted with pneumonia, acute on chronic CHF, non STEMI.  He has a hx of myelodysplasia.  He lives with his wife who, by his report, has been quite ill herself and needed hospitalization.  He normally ambulates with a cane and states that he was independent with ADLs.  Clinical Impression   Pt was seen for evaluation and found to be profoundly weak.  He needed max assist to stand and maintain balance with a walker.  He needed manual assistance to assume center of gravity over base of support in stance.  After several attempts at standing, he was able to stand with a walker for 2 minutes but unable to take any steps.  He will need SNF at d/c in order to regain independence.    PT Assessment  Patient needs continued PT services    Follow Up Recommendations  SNF         Barriers to Discharge Decreased caregiver support      Equipment Recommendations  None recommended by PT       Frequency Min 3X/week    Precautions / Restrictions Precautions Precautions: Fall Restrictions Weight Bearing Restrictions: No         Mobility  Bed Mobility Overal bed mobility: Needs Assistance Bed Mobility: Supine to Sit;Sit to Supine Supine to sit: Mod assist Sit to supine: Min assist Transfers Overall transfer level: Needs assistance Equipment used: Rolling walker (2 wheeled) Transfers: Sit to/from [facility] Sit to Stand: Max assist Stand pivot transfers: Max assist General transfer comment: pt is very unsteady in stance         PT Diagnosis: Difficulty walking;Generalized weakness  PT Problem List: Decreased strength;Decreased activity tolerance;Decreased mobility;Cardiopulmonary status limiting activity PT Treatment  Interventions: Gait training;Functional mobility training;Therapeutic activities;Therapeutic exercise     PT Goals(Current goals can be found in the care plan section) Acute Rehab PT Goals Patient Stated Goal: wants to return home to independence PT Goal Formulation: With patient Time For Goal Achievement: 12/17/13 Potential to Achieve Goals: Good  Visit Information  Last PT Received On: 12/03/13 History of Present Illness: Pt is admitted with pneumonia, acute on chronic CHF, non STEMI.  He has a hx of myelodysplasia.  He lives with his wife who, by his report, has been quite ill herself and needed hospitalization.  He normally ambulates with a cane and states that he was independent with ADLs.       Prior [place] expects to be discharged to:: Skilled nursing facility Prior Function Level of Independence: Independent with assistive device(s) Communication Communication: No difficulties    Cognition  Cognition Arousal/Alertness: Awake/alert Behavior During Therapy: WFL for tasks assessed/performed Overall Cognitive Status: Within Functional Limits for tasks assessed    Extremity/Trunk Assessment Lower Extremity Assessment Lower Extremity Assessment: Generalized weakness   Balance Balance Overall balance assessment: No apparent balance deficits (not formally assessed)  End of Session PT - End of Session Equipment Utilized During Treatment: Gait belt Activity Tolerance: Patient limited by fatigue Patient left: in bed;with call bell/phone within reach;with bed alarm set  GP     [name] L 12/03/2013, 12:15 PM

## 2013-12-03 NOTE — Progress Notes (Signed)
Inpatient Diabetes Program Recommendations  AACE/ADA: New Consensus Statement on Inpatient Glycemic Control (2013)  Target Ranges:  Prepandial:   less than 140 mg/dL      Peak postprandial:   less than 180 mg/dL (1-2 hours)      Critically ill patients:  140 - 180 mg/dL   Results for [name], [name] (MRN [id number]) as of 12/03/2013 12:08  Ref. Range 12/02/2013 16:59 12/02/2013 20:47 12/02/2013 23:15 12/03/2013 04:34 12/03/2013 07:39 12/03/2013 11:10  Glucose-Capillary Latest Range: 70-99 mg/dL 507 (H) 413 (H) 365 (H) 226 (H) 272 (H) 357 (H)   Diabetes history: DM2  Outpatient Diabetes medications: Metformin 500 mg BID  Current orders for Inpatient glycemic control: Novolog 0-15 units Q4H (started 12/02/13)   Inpatient Diabetes Program Recommendations Insulin - Meal Coverage: Please consider ordering Novolog 5 units TID with meals for meal coverage while inpatient and receiving steroids.  Note: Note CBGs with Novolog moderate correction scale ordered yesterday evening and fasting glucose 272 mg/dl this morning. Also noted steroids were changed from Solumedrol 80 mg TID was discontinued last night and Prednisone 20 mg QAM was started.  Post prandial glucose continues to be elevated.  Please consider ordering Novolog 5 units TID with meals for meal coverage.  Thanks, Barnie Alderman, RN, MSN, CCRN Diabetes Coordinator Inpatient Diabetes Program [phone] (Team Pager) [phone] ([facility] office) [phone] [facility] office)

## 2013-12-03 NOTE — Clinical Social Work Placement (Signed)
Clinical Social Work Department CLINICAL SOCIAL WORK PLACEMENT NOTE 12/03/2013  Patient:  [name], [name]  Account Number:  [id number] Admit date:  11/27/2013  Clinical Social Worker:  Benay Pike, LCSW  Date/time:  12/03/2013 01:16 PM  Clinical Social Work is seeking post-discharge placement for this patient at the following level of care:   [place]   (*CSW will update this form in Epic as items are completed)   12/03/2013  Patient/family provided with [facility] Department of Clinical Social Work's list of facilities offering this level of care within the geographic area requested by the patient (or if unable, by the patient's family).  12/03/2013  Patient/family informed of their freedom to choose among providers that offer the needed level of care, that participate in Medicare, Medicaid or managed care program needed by the patient, have an available bed and are willing to accept the patient.  12/03/2013  Patient/family informed of [facility]' ownership interest in [facility], as well as of the fact that they are under no obligation to receive care at this facility.  PASARR submitted to [facility] on  PASARR number received from [place] on   FL2 transmitted to all facilities in geographic area requested by pt/family on  12/03/2013 FL2 transmitted to all facilities within larger geographic area on   Patient informed that his/her managed care company has contracts with or will negotiate with  certain facilities, including the following:     Patient/family informed of bed offers received:   Patient chooses bed at  Physician recommends and patient chooses bed at    Patient to be transferred to  on   Patient to be transferred to facility by   The following physician request were entered in Epic:   Additional Comments: Pt has existing pasarr number.  Benay Pike, [place]

## 2013-12-03 NOTE — Progress Notes (Signed)
Subjective: Interval History: has no complaint of Nausea or vomiting. Difficulty in breathing seems to be improving. He is some of but no sputum production.  Objective: Vital signs in last 24 hours: Temp:  [97.8 F (36.6 C)-98.8 F (37.1 C)] 97.9 F (36.6 C) (03/09 0427) Pulse Rate:  [66-74] 69 (03/09 0427) Resp:  [20] 20 (03/09 0427) BP: (124-136)/(42-51) 136/42 mmHg (03/09 0427) SpO2:  [91 %-95 %] 95 % (03/09 0730) Weight change:   Intake/Output from previous day: 03/08 0701 - 03/09 0700 In: 200 [P.O.:200] Out: 2650 [Urine:2650] Intake/Output this shift: Total I/O In: -  Out: 450 [Urine:450]  General appearance: alert, cooperative and no distress Resp: diminished breath sounds posterior - bilateral and rhonchi bilaterally Cardio: regular rate and rhythm, S1, S2 normal, no murmur, click, rub or gallop GI: soft, non-tender; bowel sounds normal; no masses,  no organomegaly Extremities: edema Trace edema  Lab Results: No results found for this basename: WBC, HGB, HCT, PLT,  in the last 72 hours BMET:  Recent Labs  12/02/13 0608 12/03/13 0624  NA 136* 138  K 4.1 3.8  CL 95* 97  CO2 27 26  GLUCOSE 478* 280*  BUN 60* 80*  CREATININE 2.71* 3.15*  CALCIUM 9.1 8.9   No results found for this basename: PTH,  in the last 72 hours Iron Studies: No results found for this basename: IRON, TIBC, TRANSFERRIN, FERRITIN,  in the last 72 hours  Studies/Results: Dg Chest 2 View  12/01/2013   CLINICAL DATA:  History of community acquired pneumonia, now with vomiting and weakness  EXAM: CHEST  2 VIEW  COMPARISON:  DG CHEST 1V PORT dated 11/27/2013  FINDINGS: The lungs are adequately inflated. There is density in the left lower lobe consistent with pneumonia. The cardiac silhouette is mildly enlarged. The pulmonary vascularity is prominent centrally, but no significant cephalization is demonstrated. The pulmonary interstitial markings are not abnormally increased. There are 8 intact sternal  wires demonstrated. The patient has undergone previous CABG. The observed portions of the bony thorax appear normal.  IMPRESSION: 1. There is density in the left lower lobe consistent with pneumonia. This is slightly more conspicuous than on the earlier study. 2. The pulmonary interstitial markings have improved since the earlier study consistent with some resolution of CHF. Low-grade CHF likely remains.   Electronically Signed   By: David  Martinique   On: 12/01/2013 18:48    I have reviewed the patient's current medications.  Assessment/Plan: Problem #1 acute kidney injury: His BUN and creatinine presently his increasing. This could be from a cane/vancomycin-induced acute kidney injury/nonsteroidal [presently discontinued]. The present increasing BUN and creatinine also could be secondary to diuretics. Problem #2 pneumonia: Patient on antibiotics seems to be getting better. Problem #3 history of a trial fibrillation Problem #4 history of myelodysplastic syndrome. Problem #5 coronary artery disease Problem #6 hypokalemia: Patient on potassium supplement his potassium is normal. Problem #7 metabolic bone disease: His calcium and phosphorus is in range. Plan: We'll DC IV Lasix. We'll continue slow hydration We'll check his tablet panel. If his renal function continue to get worse possibly we may need to DC vancomycin and see for alternative antibiotics.    LOS: 6 days   Malisa Ruggiero S 12/03/2013,8:37 AM

## 2013-12-03 NOTE — Progress Notes (Signed)
CRITICAL VALUE ALERT  Critical value received:  WBC 1.2  Date of notification:  12/03/13  Time of notification:  5830  Critical value read back:yes  Nurse who received alert:  Sharyn Blitz, RN  MD notified (1st page):  Dr. Luan Pulling  Time of first page:  1055  MD notified (2nd page):  Time of second page:  Responding MD:  Dr. Luan Pulling  Time MD responded:  1500

## 2013-12-03 NOTE — Clinical Social Work Psychosocial (Signed)
Clinical Social Work Department BRIEF PSYCHOSOCIAL ASSESSMENT 12/03/2013  Patient:  [name], [name]     Account Number:  [id number]     Admit date:  11/27/2013  Clinical Social Worker:  Wyatt Haste  Date/Time:  12/03/2013 01:18 PM  Referred by:  RN  Date Referred:  12/03/2013 Referred for  SNF Placement   Other Referral:   Interview type:  Patient Other interview type:    PSYCHOSOCIAL DATA Living Status:  WIFE Admitted from facility:   Level of care:   Primary support name:  Delores Primary support relationship to patient:  SPOUSE Degree of support available:   supportive    CURRENT CONCERNS Current Concerns  Post-Acute Placement   Other Concerns:    SOCIAL WORK ASSESSMENT / PLAN CSW met with pt at bedside. Pt alert and oriented and reports he lives with his wife. Came to ED due to SOB last week. Admitted with pneumonia. Pt states his granddaughter, [name] comes by regularly to help them if needed. The rest of the family lives out of town. Pt reports he and his wife have both been in the hospital in the past month for lengthy stays and indicates this has been stressful. He is fairly independent with ADLs at baseline and generally ambulates with a cane. Pt worked with PT today. He was just barely able to stand with a walker, but unable to take any steps. Pt is aware recommendation is for SNF. CSW discussed placement process and pt is agreeable. He states he went to [facility] in 2007 after an infection and stayed several months. Aware of Medicare coverage/criteria. SNF list provided.   Assessment/plan status:  Psychosocial Support/Ongoing Assessment of Needs Other assessment/ plan:   Information/referral to community resources:   SNF list    PATIENT'S/FAMILY'S RESPONSE TO PLAN OF CARE: Pt agreeable to SNF and is hopeful that it will be [facility] prior to return home. CSW will initiate bed search and follow up with bed offers when available.       Benay Pike, [place]

## 2013-12-03 NOTE — Progress Notes (Signed)
[place] for Cefepime & Zithromax Indication: pneumonia  Allergies  Allergen Reactions  . Statins Other (See Comments)    Causes legs to hurt.   . Tape     Causes skin to peel off.     Patient Measurements: Height: 6' (182.9 cm) Weight: 201 lb 1.6 oz (91.218 kg) IBW/kg (Calculated) : 77.6 Adjusted Body Weight: 81kg  Vital Signs: Temp: 97.4 F (36.3 C) (03/09 1315) Temp src: Oral (03/09 1315) BP: 113/49 mmHg (03/09 1315) Pulse Rate: 60 (03/09 1315) Intake/Output from previous day: 03/08 0701 - 03/09 0700 In: 200 [P.O.:200] Out: 2650 [Urine:2650] Intake/Output from this shift: Total I/O In: 400 [P.O.:400] Out: 1000 [Urine:1000]  Labs:  Recent Labs  12/01/13 0637 12/02/13 0608 12/02/13 1800 12/03/13 0624  WBC  --   --   --  1.2*  HGB  --   --   --  9.9*  PLT  --   --   --  31*  LABCREA  --   --  40.84  --   CREATININE 2.44* 2.71*  --  3.15*   Estimated Creatinine Clearance: 20.9 ml/min (by C-G formula based on Cr of 3.15).  Recent Labs  11/30/13 1924  VANCOTROUGH 23.8*     Microbiology: Recent Results (from the past 720 hour(s))  CULTURE, BLOOD (ROUTINE X 2)     Status: None   Collection Time    11/27/13  1:10 AM      Result Value Ref Range Status   Specimen Description BLOOD RIGHT ANTECUBITAL   Final   Special Requests BOTTLES DRAWN AEROBIC AND ANAEROBIC 6CC EACH   Final   Culture NO GROWTH 5 DAYS   Final   Report Status 12/02/2013 FINAL   Final  CULTURE, BLOOD (ROUTINE X 2)     Status: None   Collection Time    11/27/13  1:10 AM      Result Value Ref Range Status   Specimen Description BLOOD RIGHT ANTECUBITAL   Final   Special Requests     Final   Value: BOTTLES DRAWN AEROBIC AND ANAEROBIC AEB=5CC ANA=7CC   Culture NO GROWTH 5 DAYS   Final   Report Status 12/02/2013 FINAL   Final  MRSA PCR SCREENING     Status: None   Collection Time    11/27/13  3:32 AM      Result Value Ref Range Status   MRSA by PCR  NEGATIVE  NEGATIVE Final   Comment:            The GeneXpert MRSA Assay (FDA     approved for NASAL specimens     only), is one component of a     comprehensive MRSA colonization     surveillance program. It is not     intended to diagnose MRSA     infection nor to guide or     monitor treatment for     MRSA infections.  URINE CULTURE     Status: None   Collection Time    11/27/13  5:23 AM      Result Value Ref Range Status   Specimen Description URINE, CLEAN CATCH   Final   Special Requests NONE   Final   Culture  Setup Time     Final   Value: 11/27/2013 10:30     Performed at [facility]     Final   Value: 20,OOO COLONIES/ML     Performed at  [facility]     Final   Value: ENTEROCOCCUS SPECIES     Performed at [facility]   Report Status 11/29/2013 FINAL   Final   Organism ID, Bacteria ENTEROCOCCUS SPECIES   Final    Medical History: Past Medical History  Diagnosis Date  . Essential hypertension, benign   . COPD (chronic obstructive pulmonary disease)   . Coronary atherosclerosis of native coronary artery     Multivessel status post CABG 1996  . Arthritis   . Atrial fibrillation     Coumadin stopped 12/2011 due to anemia  . Dysphagia   . Anemia     Requiring blood transfusions  . Carotid stenosis     Dr Scot Dock   . Diabetes mellitus, type 2   . Mixed hyperlipidemia     Statin intolerant  . Hematuria     Felt related to Foley insertion  . Cardiomyopathy     LVEF 45-50%  . Melena     EGD & Colonoscopy 09/2011 revealed gastritis, erosions, scars, diverticula, 8 colon polyps (largest 1.6cm, not removed 2/2 anticoagulation), small AVM in transverse colon  . History of tobacco abuse     Quit 1992  . Anxiety   . GERD (gastroesophageal reflux disease)   . Myocardial infarction   . Thrombocytopenia   . Myelodysplastic syndrome with 5 q minus 10/17/2012    On Revlimid 5 mg daily 21 days on and 7 days off  . Aortic  stenosis     Mild  . CKD (chronic kidney disease) stage 3, GFR 30-59 ml/min     Medications:  Scheduled:  . amiodarone  200 mg Oral BID  . amitriptyline  30 mg Oral QHS  . antiseptic oral rinse  15 mL Mouth Rinse q12n4p  . aspirin EC  81 mg Oral BID  . azithromycin  500 mg Intravenous Q24H  . ceFEPime (MAXIPIME) IV  2 g Intravenous Q24H  . insulin aspart  0-15 Units Subcutaneous 6 times per day  . ipratropium-albuterol  3 mL Nebulization Q4H  . isosorbide mononitrate  30 mg Oral Daily  . metoprolol tartrate  25 mg Oral BID  . potassium chloride  20 mEq Oral Daily  . predniSONE  20 mg Oral Q breakfast  . pregabalin  75 mg Oral Daily  . tamsulosin  0.4 mg Oral QPC supper   Infusions:  . sodium chloride 100 mL/hr at 12/03/13 1025   PRN:   Assessment: 80yo M who continues on empiric antibiotics for HCAP.    Hosital course complicated by acute pancytopenia, NSTEMI, and ARF.   Cx data remains negative except urine cx +enterococcus 20K colonies.   PNA clinically improved per MD.  Zithromax was added empirically for legionella coverage. Vancomycin was stopped due to declining renal function.    Vancomycin 3/3>>3/8 Cefepime 3/3>> Zithromax 3/8>>  Goal of Therapy:  Eradicate infection.  Plan:  Continue Cefepime 2gm IV q24h Change Zithromax to po Monitor renal function and cx data  Duration of therapy per MD- consider change Cefepime & Zithromax to oral Levaquin to complete 7-10 day course of antibiotics.   Biagio Borg 12/03/2013,4:11 PM

## 2013-12-03 NOTE — Progress Notes (Signed)
Nutrition Brief Note  RD pulled to chart for LOS  Wt Readings from Last 15 Encounters:  12/02/13 201 lb 1.6 oz (91.218 kg)  10/08/13 197 lb 3.2 oz (89.449 kg)  08/08/13 194 lb (87.998 kg)  06/13/13 190 lb 8 oz (86.41 kg)  06/07/13 192 lb 6.4 oz (87.272 kg)  02/15/13 200 lb 12.8 oz (91.082 kg)  01/08/13 240 lb 15.4 oz (109.3 kg)  12/15/12 207 lb 4.8 oz (94.031 kg)  11/16/12 205 lb 6.4 oz (93.169 kg)  10/23/12 206 lb (93.441 kg)  10/17/12 206 lb 12.8 oz (93.804 kg)  09/19/12 201 lb 8 oz (91.4 kg)  09/01/12 201 lb (91.173 kg)  08/29/12 202 lb 6.4 oz (91.808 kg)  08/15/12 202 lb 13.2 oz (92 kg)    Body mass index is 27.27 kg/(m^2). Patient meets criteria for overweight based on current BMI.   Current diet order is 2 gram sodium, carb modified, patient is consuming approximately 50-100% of meals at this time. Labs and medications reviewed.   No nutrition interventions warranted at this time. If nutrition issues arise, please consult RD.   Saben Donigan A. Jimmye Norman, RD, LDN Pager: [phone]

## 2013-12-03 NOTE — Progress Notes (Signed)
Subjective: He says he feels better. He had some abdominal pain last night. He's not as short of breath. He denies chest pain.  Objective: Vital signs in last 24 hours: Temp:  [97.8 F (36.6 C)-98.8 F (37.1 C)] 97.9 F (36.6 C) (03/09 0427) Pulse Rate:  [66-74] 69 (03/09 0427) Resp:  [20] 20 (03/09 0427) BP: (124-136)/(42-51) 136/42 mmHg (03/09 0427) SpO2:  [91 %-95 %] 95 % (03/09 0730) Weight change:  Last BM Date: 11/25/13  Intake/Output from previous day: 03/08 0701 - 03/09 0700 In: 200 [P.O.:200] Out: 2650 [Urine:2650]  PHYSICAL EXAM General appearance: alert, cooperative and no distress Resp: rhonchi bilaterally Cardio: regularly irregular rhythm GI: soft, non-tender; bowel sounds normal; no masses,  no organomegaly Extremities: extremities normal, atraumatic, no cyanosis or edema  Lab Results:    Basic Metabolic Panel:  Recent Labs  12/01/13 0637 12/02/13 0608 12/03/13 0624  NA 137 136* 138  K 3.7 4.1 3.8  CL 97 95* 97  CO2 25 27 26   GLUCOSE 387* 478* 280*  BUN 46* 60* 80*  CREATININE 2.44* 2.71* 3.15*  CALCIUM 8.8 9.1 8.9  PHOS  --  4.0  --    Liver Function Tests: No results found for this basename: AST, ALT, ALKPHOS, BILITOT, PROT, ALBUMIN,  in the last 72 hours No results found for this basename: LIPASE, AMYLASE,  in the last 72 hours No results found for this basename: AMMONIA,  in the last 72 hours CBC: No results found for this basename: WBC, NEUTROABS, HGB, HCT, MCV, PLT,  in the last 72 hours Cardiac Enzymes: No results found for this basename: CKTOTAL, CKMB, CKMBINDEX, TROPONINI,  in the last 72 hours BNP: No results found for this basename: PROBNP,  in the last 72 hours D-Dimer: No results found for this basename: DDIMER,  in the last 72 hours CBG:  Recent Labs  12/02/13 1659 12/02/13 2047 12/02/13 2315 12/03/13 0434 12/03/13 0739  GLUCAP 507* 413* 365* 226* 272*   Hemoglobin A1C: No results found for this basename: HGBA1C,   in the last 72 hours Fasting Lipid Panel: No results found for this basename: CHOL, HDL, LDLCALC, TRIG, CHOLHDL, LDLDIRECT,  in the last 72 hours Thyroid Function Tests: No results found for this basename: TSH, T4TOTAL, FREET4, T3FREE, THYROIDAB,  in the last 72 hours Anemia Panel: No results found for this basename: VITAMINB12, FOLATE, FERRITIN, TIBC, IRON, RETICCTPCT,  in the last 72 hours Coagulation: No results found for this basename: LABPROT, INR,  in the last 72 hours Urine Drug Screen: Drugs of Abuse  No results found for this basename: labopia, cocainscrnur, labbenz, amphetmu, thcu, labbarb    Alcohol Level: No results found for this basename: ETH,  in the last 72 hours Urinalysis: No results found for this basename: COLORURINE, APPERANCEUR, LABSPEC, PHURINE, GLUCOSEU, HGBUR, BILIRUBINUR, KETONESUR, PROTEINUR, UROBILINOGEN, NITRITE, LEUKOCYTESUR,  in the last 72 hours Misc. Labs:  ABGS No results found for this basename: PHART, PCO2, PO2ART, TCO2, HCO3,  in the last 72 hours CULTURES Recent Results (from the past 240 hour(s))  CULTURE, BLOOD (ROUTINE X 2)     Status: None   Collection Time    11/27/13  1:10 AM      Result Value Ref Range Status   Specimen Description BLOOD RIGHT ANTECUBITAL   Final   Special Requests BOTTLES DRAWN AEROBIC AND ANAEROBIC [place]   Final   Culture NO GROWTH 5 DAYS   Final   Report Status 12/02/2013 FINAL   Final  CULTURE, BLOOD (ROUTINE X 2)     Status: None   Collection Time    11/27/13  1:10 AM      Result Value Ref Range Status   Specimen Description BLOOD RIGHT ANTECUBITAL   Final   Special Requests     Final   Value: BOTTLES DRAWN AEROBIC AND ANAEROBIC AEB=5CC ANA=7CC   Culture NO GROWTH 5 DAYS   Final   Report Status 12/02/2013 FINAL   Final  MRSA PCR SCREENING     Status: None   Collection Time    11/27/13  3:32 AM      Result Value Ref Range Status   MRSA by PCR NEGATIVE  NEGATIVE Final   Comment:            The GeneXpert  MRSA Assay (FDA     approved for NASAL specimens     only), is one component of a     comprehensive MRSA colonization     surveillance program. It is not     intended to diagnose MRSA     infection nor to guide or     monitor treatment for     MRSA infections.  URINE CULTURE     Status: None   Collection Time    11/27/13  5:23 AM      Result Value Ref Range Status   Specimen Description URINE, CLEAN CATCH   Final   Special Requests NONE   Final   Culture  Setup Time     Final   Value: 11/27/2013 10:30     Performed at [facility]     Final   Value: 20,OOO COLONIES/ML     Performed at [facility]   Culture     Final   Value: ENTEROCOCCUS SPECIES     Performed at [facility]   Report Status 11/29/2013 FINAL   Final   Organism ID, Bacteria ENTEROCOCCUS SPECIES   Final   Studies/Results: Dg Chest 2 View  12/01/2013   CLINICAL DATA:  History of community acquired pneumonia, now with vomiting and weakness  EXAM: CHEST  2 VIEW  COMPARISON:  DG CHEST 1V PORT dated 11/27/2013  FINDINGS: The lungs are adequately inflated. There is density in the left lower lobe consistent with pneumonia. The cardiac silhouette is mildly enlarged. The pulmonary vascularity is prominent centrally, but no significant cephalization is demonstrated. The pulmonary interstitial markings are not abnormally increased. There are 8 intact sternal wires demonstrated. The patient has undergone previous CABG. The observed portions of the bony thorax appear normal.  IMPRESSION: 1. There is density in the left lower lobe consistent with pneumonia. This is slightly more conspicuous than on the earlier study. 2. The pulmonary interstitial markings have improved since the earlier study consistent with some resolution of CHF. Low-grade CHF likely remains.   Electronically Signed   By: David  Martinique   On: 12/01/2013 18:48    Medications:  Prior to Admission:  Prescriptions prior to admission   Medication Sig Dispense Refill  . acetaminophen (TYLENOL) 500 MG tablet Take 1 tablet (500 mg total) by mouth every 6 (six) hours as needed. Pain  30 tablet    . albuterol (PROVENTIL HFA;VENTOLIN HFA) 108 (90 BASE) MCG/ACT inhaler Inhale 2 puffs into the lungs every 6 (six) hours as needed for wheezing.  1 Inhaler  2  . amiodarone (PACERONE) 200 MG tablet Take 0.5 tablets (100 mg total) by mouth daily.  [place]  tablet  5  . amitriptyline (ELAVIL) 10 MG tablet Take 30 mg by mouth at bedtime.      Marland Kitchen amLODipine (NORVASC) 5 MG tablet TAKE ONE TABLET BY MOUTH EVERY DAY FOR BLOOD PRESSURE  90 tablet  3  . aspirin EC 81 MG tablet Take 81 mg by mouth 2 (two) times daily.       . diazepam (VALIUM) 5 MG tablet Take 5 mg by mouth every 6 (six) hours as needed. Muscles Spasms      . furosemide (LASIX) 40 MG tablet Take 1 tablet (40 mg total) by mouth daily. Take one half tablet daily on a routine basis. If your weight goes up by 3 pounds or if you have edema of your legs take one full tablet twice a day for 3 days  30 tablet  11  . HYDROcodone-acetaminophen (NORCO) 5-325 MG per tablet Take 1 tablet by mouth every 6 (six) hours as needed. Pain      . lenalidomide (REVLIMID) 10 MG capsule Take 1 capsule (10 mg total) by mouth daily.  28 capsule  0  . metFORMIN (GLUCOPHAGE) 500 MG tablet Take 500 mg by mouth 2 (two) times daily.       . metoprolol tartrate (LOPRESSOR) 25 MG tablet Take 25 mg by mouth 2 (two) times daily.        . Multiple Vitamin (MULTIVITAMIN) tablet Take 1 tablet by mouth daily.        . nitroGLYCERIN (NITROSTAT) 0.4 MG SL tablet Place 1 tablet (0.4 mg total) under the tongue every 5 (five) minutes x 3 doses as needed for chest pain (up to 3 doses).  25 tablet  3  . pregabalin (LYRICA) 75 MG capsule Take 75 mg by mouth daily.      . Red Yeast Rice 600 MG TABS Take 1 tablet by mouth daily.        . silodosin (RAPAFLO) 8 MG CAPS capsule Take 8 mg by mouth daily with breakfast.         Scheduled: .  amiodarone  200 mg Oral BID  . amitriptyline  30 mg Oral QHS  . antiseptic oral rinse  15 mL Mouth Rinse q12n4p  . aspirin EC  81 mg Oral BID  . azithromycin  500 mg Intravenous Q24H  . ceFEPime (MAXIPIME) IV  2 g Intravenous Q24H  . insulin aspart  0-15 Units Subcutaneous 6 times per day  . ipratropium-albuterol  3 mL Nebulization Q4H  . isosorbide mononitrate  30 mg Oral Daily  . metoprolol tartrate  25 mg Oral BID  . potassium chloride  20 mEq Oral Daily  . predniSONE  20 mg Oral Q breakfast  . pregabalin  75 mg Oral Daily  . tamsulosin  0.4 mg Oral QPC supper   Continuous: . sodium chloride 75 mL/hr at 12/02/13 1714   OFB:PZWCHENIDPOEU, bisacodyl, diazepam, heparin lock flush, morphine injection, nitroGLYCERIN, ondansetron (ZOFRAN) IV, polyethylene glycol, sodium chloride, sodium chloride  Assesment: He was admitted with pneumonia. He has myelodysplastic disease. He has coronary artery occlusive disease and had a non-STEMI on admission. He has congestive heart failure which is improved. He has acute on chronic renal failure and nephrology consultation is appreciated Active Problems:   HCAP (healthcare-associated pneumonia)   Community acquired pneumonia   NSTEMI (non-ST elevated myocardial infarction)    Plan: Continue current treatments. He may be able to be discharged after his renal function improves    LOS: 6 days   [name] L  12/03/2013, 9:12 AM

## 2013-12-03 NOTE — Progress Notes (Signed)
Patient ID: [name], male   DOB: 08-Apr-1932, 80 y.o.   MRN: [id number]     Subjective:    No events overnight.Denies any chest pain, SOB improving.   Objective:   Temp:  [97.8 F (36.6 C)-98.8 F (37.1 C)] 97.9 F (36.6 C) (03/09 0427) Pulse Rate:  [66-74] 69 (03/09 0427) Resp:  [20] 20 (03/09 0427) BP: (124-136)/(42-51) 136/42 mmHg (03/09 0427) SpO2:  [91 %-95 %] 95 % (03/09 0730) Last BM Date: 11/25/13  Filed Weights   11/30/13 0500 12/01/13 0444 12/02/13 0416  Weight: 199 lb 4.7 oz (90.4 kg) 195 lb 4.8 oz (88.587 kg) 201 lb 1.6 oz (91.218 kg)    Intake/Output Summary (Last 24 hours) at 12/03/13 1041 Last data filed at 12/03/13 0820  Gross per 24 hour  Intake    200 ml  Output   3100 ml  Net  -2900 ml    Telemetry: NSR, PACs  Exam:  General:NAD  Resp: clear anteriorally  Cardiac: RRR, 3/6 systolic murmur RUSB, no JVD, no carotid bruits  GI: abdomen soft, NT, ND  MSK: extremities are warm, no edema  Neuro: no focal deficits   Lab Results:  Basic Metabolic Panel:  Recent Labs Lab 11/30/13 0438 11/30/13 0500 12/01/13 0637 12/02/13 0608 12/03/13 0624  NA 140  --  137 136* 138  K 3.1*  --  3.7 4.1 3.8  CL 101  --  97 95* 97  CO2 27  --  25 27 26   GLUCOSE 161*  --  387* 478* 280*  BUN 32*  --  46* 60* 80*  CREATININE 2.15*  --  2.44* 2.71* 3.15*  CALCIUM 8.2*  --  8.8 9.1 8.9  MG  --  1.8  --   --   --     Liver Function Tests: No results found for this basename: AST, ALT, ALKPHOS, BILITOT, PROT, ALBUMIN,  in the last 168 hours  CBC:  Recent Labs Lab 11/28/13 0438 11/29/13 0420 11/30/13 0500  WBC 1.4* 1.0* 1.4*  HGB 9.1* 8.8* 9.5*  HCT 27.6* 26.9* 27.9*  MCV 93.6 94.4 90.9  PLT 41* 30* 27*    Cardiac Enzymes:  Recent Labs Lab 11/27/13 0853 11/27/13 1450 11/27/13 2027  TROPONINI 3.31* 15.61* 15.23*    BNP:  Recent Labs  01/05/13 0806 11/27/13 0110  PROBNP 1994.0* 1952.0*    Coagulation: No results found  for this basename: INR,  in the last 168 hours  ECG:   Medications:   Scheduled Medications: . amiodarone  200 mg Oral BID  . amitriptyline  30 mg Oral QHS  . antiseptic oral rinse  15 mL Mouth Rinse q12n4p  . aspirin EC  81 mg Oral BID  . azithromycin  500 mg Intravenous Q24H  . ceFEPime (MAXIPIME) IV  2 g Intravenous Q24H  . insulin aspart  0-15 Units Subcutaneous 6 times per day  . ipratropium-albuterol  3 mL Nebulization Q4H  . isosorbide mononitrate  30 mg Oral Daily  . metoprolol tartrate  25 mg Oral BID  . potassium chloride  20 mEq Oral Daily  . predniSONE  20 mg Oral Q breakfast  . pregabalin  75 mg Oral Daily  . tamsulosin  0.4 mg Oral QPC supper     Infusions: . sodium chloride 100 mL/hr at 12/03/13 1025     PRN Medications:  acetaminophen, bisacodyl, diazepam, heparin lock flush, morphine injection, nitroGLYCERIN, ondansetron (ZOFRAN) IV, polyethylene glycol, sodium chloride, sodium chloride  Assessment/Plan   80 yo male hx of CAD with prior CABG in 1996, HTN, HL, COPD, CKD admitted with SOB.  1. Acute on chronic diastolic heart failure - echo 11/28/13 LVEF 55-60%, grade II diastolic dysfunction, moderate AS, mild MR, PASP 48  - net negative 6.8 liters since admission, negative 2.5 liters yesterday with 40mg  of IV lasix x1. Cr and BUN have trended up  -recommend holding lasix  2. NSTEMI - hx of prior CABG in 1996 - tropnin increased to 15, unclear how much related to increased demand given pneumonia and obstructive CAD. - per notes case was discussed with interventional cardiology Dr Burt Knack, overall poor targets and high risk potential procedure, recs for medical management - remains on ASA, beta blocker. He has statin allergy. No ACE given AKI, no anticoag given anemia and thrombocytopenia  3. Parox afib - has not been on anticoag previously due to issues with anemia and myelodysplastic syndrome - on amio and metoprolol, will continue  4.  Myelodysplastic syndrome  5. AKI - recommend hold lasix  6. Pneumonia - abx per primary team    Carlyle Dolly, M.D., F.A.C.C.

## 2013-12-04 DIAGNOSIS — D759 Disease of blood and blood-forming organs, unspecified: Secondary | ICD-10-CM

## 2013-12-04 DIAGNOSIS — I219 Acute myocardial infarction, unspecified: Secondary | ICD-10-CM

## 2013-12-04 LAB — BASIC METABOLIC PANEL
BUN: 88 mg/dL — AB (ref 6–23)
CHLORIDE: 100 meq/L (ref 96–112)
CO2: 26 meq/L (ref 19–32)
CREATININE: 3.29 mg/dL — AB (ref 0.50–1.35)
Calcium: 8.6 mg/dL (ref 8.4–10.5)
GFR calc Af Amer: 19 mL/min — ABNORMAL LOW (ref >90)
GFR calc non Af Amer: 16 mL/min — ABNORMAL LOW (ref >90)
GLUCOSE: 251 mg/dL — AB (ref 70–99)
Potassium: 4 mEq/L (ref 3.7–5.3)
Sodium: 138 mEq/L (ref 137–147)

## 2013-12-04 LAB — CBC WITH DIFFERENTIAL/PLATELET
BASOS ABS: 0 10*3/uL (ref 0.0–0.1)
BASOS PCT: 1 % (ref 0–1)
EOS PCT: 3 % (ref 0–5)
Eosinophils Absolute: 0 10*3/uL (ref 0.0–0.7)
HCT: 29 % — ABNORMAL LOW (ref 39.0–52.0)
Hemoglobin: 10 g/dL — ABNORMAL LOW (ref 13.0–17.0)
Lymphocytes Relative: 19 % (ref 12–46)
Lymphs Abs: 0.3 10*3/uL — ABNORMAL LOW (ref 0.7–4.0)
MCH: 30.8 pg (ref 26.0–34.0)
MCHC: 34.5 g/dL (ref 30.0–36.0)
MCV: 89.2 fL (ref 78.0–100.0)
MONO ABS: 0.2 10*3/uL (ref 0.1–1.0)
Monocytes Relative: 13 % — ABNORMAL HIGH (ref 3–12)
Neutro Abs: 0.9 10*3/uL — ABNORMAL LOW (ref 1.7–7.7)
Neutrophils Relative %: 65 % (ref 43–77)
Platelets: 30 10*3/uL — ABNORMAL LOW (ref 150–400)
RBC: 3.25 MIL/uL — ABNORMAL LOW (ref 4.22–5.81)
RDW: 16.9 % — AB (ref 11.5–15.5)
WBC: 1.3 10*3/uL — CL (ref 4.0–10.5)

## 2013-12-04 LAB — GLUCOSE, CAPILLARY
GLUCOSE-CAPILLARY: 201 mg/dL — AB (ref 70–99)
GLUCOSE-CAPILLARY: 240 mg/dL — AB (ref 70–99)
Glucose-Capillary: 220 mg/dL — ABNORMAL HIGH (ref 70–99)
Glucose-Capillary: 224 mg/dL — ABNORMAL HIGH (ref 70–99)
Glucose-Capillary: 233 mg/dL — ABNORMAL HIGH (ref 70–99)
Glucose-Capillary: 244 mg/dL — ABNORMAL HIGH (ref 70–99)
Glucose-Capillary: 253 mg/dL — ABNORMAL HIGH (ref 70–99)

## 2013-12-04 LAB — HEMOGLOBIN A1C
HEMOGLOBIN A1C: 7 % — AB (ref <5.7)
Mean Plasma Glucose: 154 mg/dL — ABNORMAL HIGH (ref <117)

## 2013-12-04 LAB — PHOSPHORUS: Phosphorus: 5.7 mg/dL — ABNORMAL HIGH (ref 2.3–4.6)

## 2013-12-04 NOTE — Progress Notes (Signed)
Subjective: Patient seen in bed.  No family at the bedside.   "I feel better."  I personally reviewed and went over laboratory results with the patient.  The results are noted within this dictation.    With his sudden change in blood counts, no would be a reasonable time to perform another bone marrow aspiration and biopsy with cytogenetics.  This can be performed in Interventional radiology as an inpatient or as an outpatient depending on the patient's hospital course.  I will need to discuss with attending.   His acute pancytopenia may be unrelated to his 5q- MDS but it is important to confirm any changes with his disease via bone marrow testing.   Objective: Vital signs in last 24 hours: Temp:  [97.8 F (36.6 C)-98.6 F (37 C)] 97.8 F (36.6 C) (03/10 1413) Pulse Rate:  [64-68] 64 (03/10 1413) Resp:  [20] 20 (03/10 1413) BP: (110-117)/(43-58) 117/58 mmHg (03/10 1413) SpO2:  [92 %-97 %] 96 % (03/10 1559)  Intake/Output from previous day: 03/09 0800 - 03/10 0759 In: 2908.3 [P.O.:600; I.V.:2308.3] Out: 1700 [Urine:1700] Intake/Output this shift: Total I/O In: 500 [P.O.:500] Out: 1200 [Urine:1200]  General appearance: alert, cooperative and appears older than stated age  Lab Results:   Recent Labs  12/03/13 0624 12/04/13 0622  WBC 1.2* 1.3*  HGB 9.9* 10.0*  HCT 30.0* 29.0*  PLT 31* 30*   BMET  Recent Labs  12/03/13 0624 12/04/13 0622  NA 138 138  K 3.8 4.0  CL 97 100  CO2 26 26  GLUCOSE 280* 251*  BUN 80* 88*  CREATININE 3.15* 3.29*  CALCIUM 8.9 8.6    Studies/Results: US Renal  12/03/2013   CLINICAL DATA:  Assess for possible obstruction, evaluate renal size.  EXAM: RENAL/URINARY TRACT ULTRASOUND COMPLETE  COMPARISON:  None.  FINDINGS: Right Kidney:  Length: 10.7 cm. The echotexture of the renal cortex is lower than that of the adjacent liver which is normal. There is an upper pole cyst measuring 2.6 cm in diameter.  Left Kidney:  Length: 10.6 cm.  Echogenicity within normal limits. No mass or hydronephrosis visualized.  Bladder:  The prostate gland is enlarged and produces a moderate impression upon the urinary bladder base. No acute bladder abnormality is demonstrated.  IMPRESSION: 1. The kidneys demonstrate normal size and echotexture. There is no evidence of hydronephrosis. 2. There is a simple appearing cyst in the upper pole of the right kidney. 3. There is prostatic enlargement with produces a prominent impression upon the urinary bladder base.   Electronically Signed   By: David  Martinique   On: 12/03/2013 09:26    Medications: I have reviewed the patient's current medications.  Assessment/Plan: 1. 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  Recommend repeat bone marrow aspiration and biopsy with cytogenetics by interventional radiology.  Will discuss with attending physician tomorrow.  2. 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 Became completely transfusion independent with last outpatient transfusion on 01/16/2013.  Recommend repeat bone marrow aspiration and biopsy with cytogenetics by interventional radiology.  Will discuss with attending physician tomorrow.      Myelodysplastic syndrome with 5 q minus   09/05/2012 Bone Marrow Biopsy 5 q- MDS   09/19/2012 - 12/15/2012 Chemotherapy Approximate start date of Revlimid 5 mg 21 days on and 7 days  off   12/16/2012 - 07/18/2013 Chemotherapy Dose increased to 10 mg Revlimid 21 days on and 7 days off   07/19/2013 -  Chemotherapy Dose increased to 10 mg daily   3. NSTEMI, with elevated troponin. Followed by Cardiology  4. HCAP, on antibiotics.  5. Recommend PRBC transfusion to maintain a Hgb of 9 g/dL given patient's cardiac issues.  6. Recommend platelet transfusion for a  platelet count of 10,000 or less and/or active bleeding.  7. HOLD REVLIMID as the medication can be prothrombogenic. Given the patient's present situation, Revlimid is contraindicated. 8. Will follow along as an inpatient.  Will need to discuss the patient's case with attending physician, Dr. Luan Pulling, as we recommend bone marrow aspiration and biopsy with cytogenetics to evaluate for transformation of his 5q- MDS Syndrome.  This can be done as an inpatient by Interventional Radiology.   Patient and plan discussed with Dr. Farrel Gobble and he is in agreement with the aforementioned.     LOS: 7 days    Lella Mullany 12/04/2013

## 2013-12-04 NOTE — Progress Notes (Signed)
Inpatient Diabetes Program Recommendations  AACE/ADA: New Consensus Statement on Inpatient Glycemic Control (2013)  Target Ranges:  Prepandial:   less than 140 mg/dL      Peak postprandial:   less than 180 mg/dL (1-2 hours)      Critically ill patients:  140 - 180 mg/dL   Results for [name], [name] (MRN [id number]) as of 12/04/2013 09:46  Ref. Range 12/03/2013 07:39 12/03/2013 11:10 12/03/2013 16:10 12/03/2013 20:10 12/03/2013 22:44 12/04/2013 00:15 12/04/2013 04:09 12/04/2013 08:28  Glucose-Capillary Latest Range: 70-99 mg/dL 272 (H) 357 (H) 307 (H) 343 (H) 271 (H) 253 (H) 201 (H) 233 (H)   Diabetes history: DM2  Outpatient Diabetes medications: Metformin 500 mg BID  Current orders for Inpatient glycemic control: Novolog 0-15 units Q4H  Inpatient Diabetes Program Recommendations Insulin - Basal: Please consider ordering low dose basal insulin; recommend starting with Levemir 15 units QHS. Insulin - Meal Coverage: Please consider ordering Novolog 5 units TID with meals for meal coverage while inpatient and receiving steroids.  Notes: Over the past 24 hours, blood glucose has ranged from 201-357 mg/dl. On 12/03/13 patient received a total of Novolog 65 units for correction.  Please consider ordering low dose basal insulin and ordering Novolog 5 units TID for meal coverage.  Thanks, Barnie Alderman, RN, MSN, CCRN Diabetes Coordinator Inpatient Diabetes Program [phone] (Team Pager) [phone] ([facility] office) [phone] [facility] office)

## 2013-12-04 NOTE — Clinical Social Work Note (Signed)
Presented pt with bed offers. Pt chooses bed at [facility]. Facility notified. Pt requesting private room, facility aware and will address at d/c. CSW will continue to follow.     Delfina Redwood  BSW Intern

## 2013-12-04 NOTE — Progress Notes (Signed)
[name]  MRN: [id number]  DOB/AGE: [birth year] 80 y.o.  Primary Care Physician:HAWKINS,EDWARD L, MD  Admit date: 11/27/2013  Chief Complaint:  Chief Complaint  Patient presents with  . Shortness of Breath    S-Pt presented on  11/27/2013 with  Chief Complaint  Patient presents with  . Shortness of Breath  .    Pt today feels better.  Meds . amiodarone  200 mg Oral BID  . amitriptyline  30 mg Oral QHS  . antiseptic oral rinse  15 mL Mouth Rinse q12n4p  . aspirin EC  81 mg Oral BID  . azithromycin  500 mg Oral Daily  . ceFEPime (MAXIPIME) IV  2 g Intravenous Q24H  . insulin aspart  0-15 Units Subcutaneous 6 times per day  . ipratropium-albuterol  3 mL Nebulization Q4H  . isosorbide mononitrate  30 mg Oral Daily  . metoprolol tartrate  25 mg Oral BID  . potassium chloride  20 mEq Oral Daily  . predniSONE  20 mg Oral Q breakfast  . pregabalin  75 mg Oral Daily  . tamsulosin  0.4 mg Oral QPC supper       Physical Exam: Vital signs in last 24 hours: Temp:  [97.4 F (36.3 C)-98.6 F (37 C)] 98.6 F (37 C) (03/10 0540) Pulse Rate:  [60-68] 68 (03/10 0540) Resp:  [20] 20 (03/10 0540) BP: (110-115)/(43-55) 115/55 mmHg (03/10 0540) SpO2:  [92 %-95 %] 92 % (03/10 0707) Weight change:  Last BM Date: 11/26/13  Intake/Output from previous day: 03/09 0701 - 03/10 0700 In: 2908.3 [P.O.:600; I.V.:2308.3] Out: 1700 [Urine:1700]     Physical Exam: General- pt is awake,alert, oriented to time place and person Resp- No acute REsp distress, CTA B/L NO Rhonchi CVS- S1S2 Irregular in rate and rhythm, SEM + GIT- BS+, soft, NT, ND EXT- NO LE Edema, Cyanosis   Lab Results: CBC  Recent Labs  12/03/13 0624 12/04/13 0622  WBC 1.2* 1.3*  HGB 9.9* 10.0*  HCT 30.0* 29.0*  PLT 31* 30*    BMET  Recent Labs  12/03/13 0624 12/04/13 0622  NA 138 138  K 3.8 4.0  CL 97 100  CO2 26 26  GLUCOSE 280* 251*  BUN 80* 88*  CREATININE 3.15* 3.29*  CALCIUM 8.9 8.6    Creat Trend  2015 1.79==>2.71 ==>3.15==>3.29 2014 1.4--1.8  2013 1.2--1.6  2009 1.47    MICRO Recent Results (from the past 240 hour(s))  CULTURE, BLOOD (ROUTINE X 2)     Status: None   Collection Time    11/27/13  1:10 AM      Result Value Ref Range Status   Specimen Description BLOOD RIGHT ANTECUBITAL   Final   Special Requests BOTTLES DRAWN AEROBIC AND ANAEROBIC 6CC EACH   Final   Culture NO GROWTH 5 DAYS   Final   Report Status 12/02/2013 FINAL   Final  CULTURE, BLOOD (ROUTINE X 2)     Status: None   Collection Time    11/27/13  1:10 AM      Result Value Ref Range Status   Specimen Description BLOOD RIGHT ANTECUBITAL   Final   Special Requests     Final   Value: BOTTLES DRAWN AEROBIC AND ANAEROBIC AEB=5CC ANA=7CC   Culture NO GROWTH 5 DAYS   Final   Report Status 12/02/2013 FINAL   Final  MRSA PCR SCREENING     Status: None   Collection Time    11/27/13  3:32 AM  Result Value Ref Range Status   MRSA by PCR NEGATIVE  NEGATIVE Final   Comment:            The GeneXpert MRSA Assay (FDA     approved for NASAL specimens     only), is one component of a     comprehensive MRSA colonization     surveillance program. It is not     intended to diagnose MRSA     infection nor to guide or     monitor treatment for     MRSA infections.  URINE CULTURE     Status: None   Collection Time    11/27/13  5:23 AM      Result Value Ref Range Status   Specimen Description URINE, CLEAN CATCH   Final   Special Requests NONE   Final   Culture  Setup Time     Final   Value: 11/27/2013 10:30     Performed at [facility]     Final   Value: 20,OOO COLONIES/ML     Performed at [facility]   Culture     Final   Value: ENTEROCOCCUS SPECIES     Performed at [facility]   Report Status 11/29/2013 FINAL   Final   Organism ID, Bacteria ENTEROCOCCUS SPECIES   Final      Lab Results  Component Value Date   PTH 57.7 12/02/2013   CALCIUM 8.6  12/04/2013   PHOS 5.7* 12/04/2013       Impression: 1)Renal AKI secondary to Multiple factors  SIRS- admitted with Pneumonia  Vanco Toxicity  NSAIDS  NSTEMI  Hypolemia- negative by 5 liters  AKI worsened with Creat now at 3.2 Most likely near pleateau CKD stage 3 .  CKD since 2009  CKD secondary to DM/ HTN/ Age asso decline/Post renal  Progression of CKD marked with AKI  Proteinura at goal 550mg  as per spot sample  2)HTN  Target Organ damage  CKD  CAD  CHF  Meds On Beta blockers  ON Vasodilators  3)Haem-  Anemia  Thrombocytopenia  Neutropenia  Sec to MDS.  Primary team and heam following   4)CKD Mineral-Bone Disorder  PTH acceptable  Secondary Hyperparathyroidism absent Phosphorus at goal  5)CAD- pt has NSTEMI  Cardiology and Primary MD following   6)Electrolytes  Hypokalemic  Being repleted  Normonatremic   7)Acid base  Co2 at goal       Plan:  Will continue current care Will follow bmet      BHUTANI,MANPREET S 12/04/2013, 8:44 AM

## 2013-12-04 NOTE — Progress Notes (Signed)
Patient ID: VONTRELL PULLMAN, male   DOB: [birth year], 80 y.o.   MRN: [id number]     Subjective:    No chest pain overnight  Objective:   Temp:  [97.4 F (36.3 C)-98.6 F (37 C)] 98.6 F (37 C) (03/10 0540) Pulse Rate:  [60-68] 68 (03/10 0540) Resp:  [20] 20 (03/10 0540) BP: (110-115)/(43-55) 115/55 mmHg (03/10 0540) SpO2:  [92 %-95 %] 92 % (03/10 0707) Last BM Date: 11/26/13  Filed Weights   11/30/13 0500 12/01/13 0444 12/02/13 0416  Weight: 199 lb 4.7 oz (90.4 kg) 195 lb 4.8 oz (88.587 kg) 201 lb 1.6 oz (91.218 kg)    Intake/Output Summary (Last 24 hours) at 12/04/13 0947 Last data filed at 12/04/13 0600  Gross per 24 hour  Intake 2708.33 ml  Output   1250 ml  Net 1458.33 ml    Telemetry: NSR  Exam:  General: NAD  Resp: clear anteriorally  Cardiac: RRR, no m/r/g, no JVD, no carotid bruits  GI: abdomen soft, NT, ND  HWE:XHBZJ extremities are warm, no edema  Neuro: no focal deficits   Lab Results:  Basic Metabolic Panel:  Recent Labs Lab 11/30/13 0438 11/30/13 0500  12/02/13 0608 12/03/13 0624 12/04/13 0622  NA 140  --   < > 136* 138 138  K 3.1*  --   < > 4.1 3.8 4.0  CL 101  --   < > 95* 97 100  CO2 27  --   < > 27 26 26   GLUCOSE 161*  --   < > 478* 280* 251*  BUN 32*  --   < > 60* 80* 88*  CREATININE 2.15*  --   < > 2.71* 3.15* 3.29*  CALCIUM 8.2*  --   < > 9.1  8.5 8.9 8.6  MG  --  1.8  --   --   --   --   < > = values in this interval not displayed.  Liver Function Tests: No results found for this basename: AST, ALT, ALKPHOS, BILITOT, PROT, ALBUMIN,  in the last 168 hours  CBC:  Recent Labs Lab 11/30/13 0500 12/03/13 0624 12/04/13 0622  WBC 1.4* 1.2* 1.3*  HGB 9.5* 9.9* 10.0*  HCT 27.9* 30.0* 29.0*  MCV 90.9 89.8 89.2  PLT 27* 31* 30*    Cardiac Enzymes:  Recent Labs Lab 11/27/13 1450 11/27/13 2027  TROPONINI 15.61* 15.23*    BNP:  Recent Labs  01/05/13 0806 11/27/13 0110  PROBNP 1994.0* 1952.0*     Coagulation: No results found for this basename: INR,  in the last 168 hours  ECG:   Medications:   Scheduled Medications: . amiodarone  200 mg Oral BID  . amitriptyline  30 mg Oral QHS  . antiseptic oral rinse  15 mL Mouth Rinse q12n4p  . aspirin EC  81 mg Oral BID  . azithromycin  500 mg Oral Daily  . ceFEPime (MAXIPIME) IV  2 g Intravenous Q24H  . insulin aspart  0-15 Units Subcutaneous 6 times per day  . ipratropium-albuterol  3 mL Nebulization Q4H  . isosorbide mononitrate  30 mg Oral Daily  . metoprolol tartrate  25 mg Oral BID  . potassium chloride  20 mEq Oral Daily  . predniSONE  20 mg Oral Q breakfast  . pregabalin  75 mg Oral Daily  . tamsulosin  0.4 mg Oral QPC supper     Infusions: . sodium chloride 100 mL/hr at 12/04/13 0600     PRN  Medications:  acetaminophen, bisacodyl, diazepam, heparin lock flush, morphine injection, nitroGLYCERIN, ondansetron (ZOFRAN) IV, polyethylene glycol, sodium chloride, sodium chloride     Assessment/Plan    80 yo male hx of CAD with prior CABG in 1996, HTN, HL, COPD, CKD admitted with SOB.   1. Acute on chronic diastolic heart failure  - echo 11/28/13 LVEF 55-60%, grade II diastolic dysfunction, moderate AS, mild MR, PASP 48  - net negative 5.2 liters since admission, positive 1.2 liters yesterday. Cr trending up, likely hypovolemic -continue holding lasix  2. NSTEMI  - hx of prior CABG in 1996  - tropnin increased to 15, unclear how much related to increased demand given pneumonia and obstructive CAD.  - per notes case was discussed with interventional cardiology Dr Burt Knack, overall poor targets and high risk potential procedure, recs for medical management  - remains on ASA, beta blocker. He has statin allergy. No ACE given AKI, no anticoag or plavix  given anemia and thrombocytopenia   3. Parox afib  - has not been on anticoag previously due to issues with anemia and myelodysplastic syndrome  - on amio and  metoprolol and ASA, will continue   4. Myelodysplastic syndrome   5. AKI  - recommend hold lasix   6. Pneumonia  - abx per primary team    Continue current medical therapy, no further testing of cardiac interventions planned. Continue to hold lasix in setting of AKI likely related to hypovolemia, once renal function normalized can start back low dose oral lasix until cardio follow up 1 week after discharge. Will signoff of inpatient care.      Carlyle Dolly, M.D., F.A.C.C.

## 2013-12-04 NOTE — Progress Notes (Signed)
Subjective: He says he feels somewhat better. He still has chest congestion.  Objective: Vital signs in last 24 hours: Temp:  [97.4 F (36.3 C)-98.6 F (37 C)] 98.6 F (37 C) (03/10 0540) Pulse Rate:  [60-68] 68 (03/10 0540) Resp:  [20] 20 (03/10 0540) BP: (110-115)/(43-55) 115/55 mmHg (03/10 0540) SpO2:  [92 %-95 %] 92 % (03/10 0707) Weight change:  Last BM Date: 11/26/13  Intake/Output from previous day: 03/09 0701 - 03/10 0700 In: 2908.3 [P.O.:600; I.V.:2308.3] Out: 1700 [Urine:1700]  PHYSICAL EXAM General appearance: alert, cooperative and mild distress Resp: rhonchi bilaterally Cardio: irregularly irregular rhythm GI: soft, non-tender; bowel sounds normal; no masses,  no organomegaly Extremities: extremities normal, atraumatic, no cyanosis or edema  Lab Results:    Basic Metabolic Panel:  Recent Labs  12/02/13 0608 12/03/13 0624 12/04/13 0622  NA 136* 138 138  K 4.1 3.8 4.0  CL 95* 97 100  CO2 27 26 26   GLUCOSE 478* 280* 251*  BUN 60* 80* 88*  CREATININE 2.71* 3.15* 3.29*  CALCIUM 9.1  8.5 8.9 8.6  PHOS 4.0  --  5.7*   Liver Function Tests: No results found for this basename: AST, ALT, ALKPHOS, BILITOT, PROT, ALBUMIN,  in the last 72 hours No results found for this basename: LIPASE, AMYLASE,  in the last 72 hours No results found for this basename: AMMONIA,  in the last 72 hours CBC:  Recent Labs  12/03/13 0624 12/04/13 0622  WBC 1.2* 1.3*  NEUTROABS 0.9* 0.9*  HGB 9.9* 10.0*  HCT 30.0* 29.0*  MCV 89.8 89.2  PLT 31* 30*   Cardiac Enzymes: No results found for this basename: CKTOTAL, CKMB, CKMBINDEX, TROPONINI,  in the last 72 hours BNP: No results found for this basename: PROBNP,  in the last 72 hours D-Dimer: No results found for this basename: DDIMER,  in the last 72 hours CBG:  Recent Labs  12/03/13 1610 12/03/13 2010 12/03/13 2244 12/04/13 0015 12/04/13 0409 12/04/13 0828  GLUCAP 307* 343* 271* 253* 201* 233*   Hemoglobin  A1C: No results found for this basename: HGBA1C,  in the last 72 hours Fasting Lipid Panel: No results found for this basename: CHOL, HDL, LDLCALC, TRIG, CHOLHDL, LDLDIRECT,  in the last 72 hours Thyroid Function Tests: No results found for this basename: TSH, T4TOTAL, FREET4, T3FREE, THYROIDAB,  in the last 72 hours Anemia Panel: No results found for this basename: VITAMINB12, FOLATE, FERRITIN, TIBC, IRON, RETICCTPCT,  in the last 72 hours Coagulation: No results found for this basename: LABPROT, INR,  in the last 72 hours Urine Drug Screen: Drugs of Abuse  No results found for this basename: labopia, cocainscrnur, labbenz, amphetmu, thcu, labbarb    Alcohol Level: No results found for this basename: ETH,  in the last 72 hours Urinalysis: No results found for this basename: COLORURINE, APPERANCEUR, LABSPEC, PHURINE, GLUCOSEU, HGBUR, BILIRUBINUR, KETONESUR, PROTEINUR, UROBILINOGEN, NITRITE, LEUKOCYTESUR,  in the last 72 hours Misc. Labs:  ABGS No results found for this basename: PHART, PCO2, PO2ART, TCO2, HCO3,  in the last 72 hours CULTURES Recent Results (from the past 240 hour(s))  CULTURE, BLOOD (ROUTINE X 2)     Status: None   Collection Time    11/27/13  1:10 AM      Result Value Ref Range Status   Specimen Description BLOOD RIGHT ANTECUBITAL   Final   Special Requests BOTTLES DRAWN AEROBIC AND ANAEROBIC 6CC EACH   Final   Culture NO GROWTH 5 DAYS   Final  Report Status 12/02/2013 FINAL   Final  CULTURE, BLOOD (ROUTINE X 2)     Status: None   Collection Time    11/27/13  1:10 AM      Result Value Ref Range Status   Specimen Description BLOOD RIGHT ANTECUBITAL   Final   Special Requests     Final   Value: BOTTLES DRAWN AEROBIC AND ANAEROBIC AEB=5CC ANA=7CC   Culture NO GROWTH 5 DAYS   Final   Report Status 12/02/2013 FINAL   Final  MRSA PCR SCREENING     Status: None   Collection Time    11/27/13  3:32 AM      Result Value Ref Range Status   MRSA by PCR NEGATIVE   NEGATIVE Final   Comment:            The GeneXpert MRSA Assay (FDA     approved for NASAL specimens     only), is one component of a     comprehensive MRSA colonization     surveillance program. It is not     intended to diagnose MRSA     infection nor to guide or     monitor treatment for     MRSA infections.  URINE CULTURE     Status: None   Collection Time    11/27/13  5:23 AM      Result Value Ref Range Status   Specimen Description URINE, CLEAN CATCH   Final   Special Requests NONE   Final   Culture  Setup Time     Final   Value: 11/27/2013 10:30     Performed at [facility]     Final   Value: 20,OOO COLONIES/ML     Performed at [facility]   Culture     Final   Value: ENTEROCOCCUS SPECIES     Performed at [facility]   Report Status 11/29/2013 FINAL   Final   Organism ID, Bacteria ENTEROCOCCUS SPECIES   Final   Studies/Results: US Renal  12/03/2013   CLINICAL DATA:  Assess for possible obstruction, evaluate renal size.  EXAM: RENAL/URINARY TRACT ULTRASOUND COMPLETE  COMPARISON:  None.  FINDINGS: Right Kidney:  Length: 10.7 cm. The echotexture of the renal cortex is lower than that of the adjacent liver which is normal. There is an upper pole cyst measuring 2.6 cm in diameter.  Left Kidney:  Length: 10.6 cm. Echogenicity within normal limits. No mass or hydronephrosis visualized.  Bladder:  The prostate gland is enlarged and produces a moderate impression upon the urinary bladder base. No acute bladder abnormality is demonstrated.  IMPRESSION: 1. The kidneys demonstrate normal size and echotexture. There is no evidence of hydronephrosis. 2. There is a simple appearing cyst in the upper pole of the right kidney. 3. There is prostatic enlargement with produces a prominent impression upon the urinary bladder base.   Electronically Signed   By: David  Martinique   On: 12/03/2013 09:26    Medications:  Prior to Admission:  Prescriptions prior  to admission  Medication Sig Dispense Refill  . acetaminophen (TYLENOL) 500 MG tablet Take 1 tablet (500 mg total) by mouth every 6 (six) hours as needed. Pain  30 tablet    . albuterol (PROVENTIL HFA;VENTOLIN HFA) 108 (90 BASE) MCG/ACT inhaler Inhale 2 puffs into the lungs every 6 (six) hours as needed for wheezing.  1 Inhaler  2  . amiodarone (PACERONE) 200 MG tablet Take 0.5  tablets (100 mg total) by mouth daily.  30 tablet  5  . amitriptyline (ELAVIL) 10 MG tablet Take 30 mg by mouth at bedtime.      Marland Kitchen amLODipine (NORVASC) 5 MG tablet TAKE ONE TABLET BY MOUTH EVERY DAY FOR BLOOD PRESSURE  90 tablet  3  . aspirin EC 81 MG tablet Take 81 mg by mouth 2 (two) times daily.       . diazepam (VALIUM) 5 MG tablet Take 5 mg by mouth every 6 (six) hours as needed. Muscles Spasms      . furosemide (LASIX) 40 MG tablet Take 1 tablet (40 mg total) by mouth daily. Take one half tablet daily on a routine basis. If your weight goes up by 3 pounds or if you have edema of your legs take one full tablet twice a day for 3 days  30 tablet  11  . HYDROcodone-acetaminophen (NORCO) 5-325 MG per tablet Take 1 tablet by mouth every 6 (six) hours as needed. Pain      . lenalidomide (REVLIMID) 10 MG capsule Take 1 capsule (10 mg total) by mouth daily.  28 capsule  0  . metFORMIN (GLUCOPHAGE) 500 MG tablet Take 500 mg by mouth 2 (two) times daily.       . metoprolol tartrate (LOPRESSOR) 25 MG tablet Take 25 mg by mouth 2 (two) times daily.        . Multiple Vitamin (MULTIVITAMIN) tablet Take 1 tablet by mouth daily.        . nitroGLYCERIN (NITROSTAT) 0.4 MG SL tablet Place 1 tablet (0.4 mg total) under the tongue every 5 (five) minutes x 3 doses as needed for chest pain (up to 3 doses).  25 tablet  3  . pregabalin (LYRICA) 75 MG capsule Take 75 mg by mouth daily.      . Red Yeast Rice 600 MG TABS Take 1 tablet by mouth daily.        . silodosin (RAPAFLO) 8 MG CAPS capsule Take 8 mg by mouth daily with breakfast.          Scheduled: . amiodarone  200 mg Oral BID  . amitriptyline  30 mg Oral QHS  . antiseptic oral rinse  15 mL Mouth Rinse q12n4p  . aspirin EC  81 mg Oral BID  . azithromycin  500 mg Oral Daily  . ceFEPime (MAXIPIME) IV  2 g Intravenous Q24H  . insulin aspart  0-15 Units Subcutaneous 6 times per day  . ipratropium-albuterol  3 mL Nebulization Q4H  . isosorbide mononitrate  30 mg Oral Daily  . metoprolol tartrate  25 mg Oral BID  . potassium chloride  20 mEq Oral Daily  . predniSONE  20 mg Oral Q breakfast  . pregabalin  75 mg Oral Daily  . tamsulosin  0.4 mg Oral QPC supper   Continuous: . sodium chloride 100 mL/hr at 12/04/13 0600   XNA:[id number], bisacodyl, diazepam, heparin lock flush, morphine injection, nitroGLYCERIN, ondansetron (ZOFRAN) IV, polyethylene glycol, sodium chloride, sodium chloride  Assesment: He was admitted with community-acquired pneumonia and he is doing better with that. He is still congested. He had acute respiratory failure and required BiPAP which is much improved. He is now on nasal oxygen.  He has coronary artery occlusive disease and during his acute illness he had a non Stemi. He seems to be recovering from that. He has some congestive heart failure which has been a problem in the past when he becomes more anemic and he  had some CHF related to this admission as well. He is off diuretics now after significant diuresis.  He has acute kidney injury. His ultrasound did not show definite obstruction. He also did not have a lot of medical disease. Active Problems:   HCAP (healthcare-associated pneumonia)   Community acquired pneumonia   NSTEMI (non-ST elevated myocardial infarction)    Plan: Continue treatment. He's going to need a facility placement after his discharge but he is not ready for discharge yet.    LOS: 7 days   Jewelle Whitner L 12/04/2013, 8:51 AM

## 2013-12-04 NOTE — Clinical Social Work Placement (Signed)
Clinical Social Work Department CLINICAL SOCIAL WORK PLACEMENT NOTE 12/04/2013  Patient:  [name], [name]  Account Number:  [id number] Admit date:  11/27/2013  Clinical Social Worker:  Benay Pike, LCSW  Date/time:  12/03/2013 01:16 PM  Clinical Social Work is seeking post-discharge placement for this patient at the following level of care:   [place]   (*CSW will update this form in Epic as items are completed)   12/03/2013  Patient/family provided with [place] Department of Clinical Social Work's list of facilities offering this level of care within the geographic area requested by the patient (or if unable, by the patient's family).  12/03/2013  Patient/family informed of their freedom to choose among providers that offer the needed level of care, that participate in Medicare, Medicaid or managed care program needed by the patient, have an available bed and are willing to accept the patient.  12/03/2013  Patient/family informed of [facility]' ownership interest in [facility], as well as of the fact that they are under no obligation to receive care at this facility.  PASARR submitted to [employer] on  PASARR number received from [place] on   FL2 transmitted to all facilities in geographic area requested by pt/family on  12/03/2013 FL2 transmitted to all facilities within larger geographic area on   Patient informed that his/her managed care company has contracts with or will negotiate with  certain facilities, including the following:     Patient/family informed of bed offers received:  12/04/2013 Patient chooses bed at [facility] Physician recommends and patient chooses bed at  [facility]  Patient to be transferred to [facility] on   Patient to be transferred to facility by   The following physician request were entered in Epic:   Additional Comments: Pt has existing pasarr number.   Delfina Redwood BSW Intern

## 2013-12-05 LAB — BASIC METABOLIC PANEL
BUN: 76 mg/dL — ABNORMAL HIGH (ref 6–23)
CHLORIDE: 106 meq/L (ref 96–112)
CO2: 24 meq/L (ref 19–32)
CREATININE: 2.85 mg/dL — AB (ref 0.50–1.35)
Calcium: 8.2 mg/dL — ABNORMAL LOW (ref 8.4–10.5)
GFR calc Af Amer: 23 mL/min — ABNORMAL LOW (ref >90)
GFR calc non Af Amer: 20 mL/min — ABNORMAL LOW (ref >90)
GLUCOSE: 180 mg/dL — AB (ref 70–99)
POTASSIUM: 4.1 meq/L (ref 3.7–5.3)
Sodium: 140 mEq/L (ref 137–147)

## 2013-12-05 LAB — CBC WITH DIFFERENTIAL/PLATELET
BASOS ABS: 0 10*3/uL (ref 0.0–0.1)
Basophils Relative: 1 % (ref 0–1)
Eosinophils Absolute: 0 10*3/uL (ref 0.0–0.7)
Eosinophils Relative: 2 % (ref 0–5)
HEMATOCRIT: 27.2 % — AB (ref 39.0–52.0)
HEMOGLOBIN: 9.3 g/dL — AB (ref 13.0–17.0)
LYMPHS PCT: 19 % (ref 12–46)
Lymphs Abs: 0.3 10*3/uL — ABNORMAL LOW (ref 0.7–4.0)
MCH: 30.8 pg (ref 26.0–34.0)
MCHC: 34.2 g/dL (ref 30.0–36.0)
MCV: 90.1 fL (ref 78.0–100.0)
MONO ABS: 0.1 10*3/uL (ref 0.1–1.0)
MONOS PCT: 8 % (ref 3–12)
NEUTROS ABS: 1.1 10*3/uL — AB (ref 1.7–7.7)
Neutrophils Relative %: 70 % (ref 43–77)
Platelets: 26 10*3/uL — CL (ref 150–400)
RBC: 3.02 MIL/uL — ABNORMAL LOW (ref 4.22–5.81)
RDW: 16.9 % — ABNORMAL HIGH (ref 11.5–15.5)
Smear Review: DECREASED
WBC: 1.5 10*3/uL — AB (ref 4.0–10.5)

## 2013-12-05 LAB — APTT: aPTT: 22 seconds — ABNORMAL LOW (ref 24–37)

## 2013-12-05 LAB — GLUCOSE, CAPILLARY
GLUCOSE-CAPILLARY: 168 mg/dL — AB (ref 70–99)
Glucose-Capillary: 152 mg/dL — ABNORMAL HIGH (ref 70–99)
Glucose-Capillary: 187 mg/dL — ABNORMAL HIGH (ref 70–99)
Glucose-Capillary: 327 mg/dL — ABNORMAL HIGH (ref 70–99)
Glucose-Capillary: 238 mg/dL — ABNORMAL HIGH (ref 70–99)

## 2013-12-05 LAB — PROTIME-INR
INR: 1.22 (ref 0.00–1.49)
Prothrombin Time: 15.1 seconds (ref 11.6–15.2)

## 2013-12-05 NOTE — Progress Notes (Signed)
Patient ID: [name], male   DOB: Sep 10, 1932, 80 y.o.   MRN: [id number]   Request has been received for BM bx Per Oncology at [facility]  Pt with pancytopenia Hx myelodysplastic syndrome last chemo 06/2013  Orders in for pt to arrive by ambulance to [facility] Rad[facility] 3/12 at 800 am RN to arrange for ambulance  RN aware and agreeable cyto and CT aware

## 2013-12-05 NOTE — Progress Notes (Signed)
Patient's case discussed with Dr. Luan Pulling (attending) and Dr. Pascal Lux (IR).  We will arrange for IR bone marrow aspiration and biopsy tomorrow.  Labs ordered: PT/INR, aPTT.  Patient is to be NPO at midnight tonight.  Order placed for bone marrow aspiration and biopsy with cytogenetics tomorrow.  I discussed this information with the patient's nurse, Marcie Bal.  She is informed.  IR has already established an 8 AM procedure time at [facility].    [name]

## 2013-12-05 NOTE — Progress Notes (Signed)
[name]  MRN: [id number]  DOB/AGE: 06/09/1932 80 y.o.  Primary Care Physician:HAWKINS,EDWARD L, MD  Admit date: 11/27/2013  Chief Complaint:  Chief Complaint  Patient presents with  . Shortness of Breath    [name]-Pt presented on  11/27/2013 with  Chief Complaint  Patient presents with  . Shortness of Breath  .    Pt offers no new complaints.  Meds . amiodarone  200 mg Oral BID  . amitriptyline  30 mg Oral QHS  . antiseptic oral rinse  15 mL Mouth Rinse q12n4p  . aspirin EC  81 mg Oral BID  . azithromycin  500 mg Oral Daily  . ceFEPime (MAXIPIME) IV  2 g Intravenous Q24H  . insulin aspart  0-15 Units Subcutaneous 6 times per day  . ipratropium-albuterol  3 mL Nebulization Q4H  . isosorbide mononitrate  30 mg Oral Daily  . metoprolol tartrate  25 mg Oral BID  . potassium chloride  20 mEq Oral Daily  . predniSONE  20 mg Oral Q breakfast  . pregabalin  75 mg Oral Daily  . tamsulosin  0.4 mg Oral QPC supper       Physical Exam: Vital signs in last 24 hours: Temp:  [97.8 F (36.6 C)-98.1 F (36.7 C)] 98 F (36.7 C) (03/11 0458) Pulse Rate:  [62-70] 62 (03/11 0740) Resp:  [16-20] 16 (03/11 0740) BP: (116-125)/(47-60) 125/47 mmHg (03/11 0458) SpO2:  [92 %-97 %] 92 % (03/11 0740) Weight change:  Last BM Date: 11/28/13  Intake/Output from previous day: 03/10 0701 - 03/11 0700 In: 2900 [P.O.:500; I.V.:2400] Out: 2600 [Urine:2600]     Physical Exam: General- pt is awake,alert, oriented to time place and person Resp- No acute REsp distress, CTA B/L NO Rhonchi CVS- S1S2 Irregular in rate and rhythm, SEM + GIT- BS+, soft, NT, ND EXT- NO LE Edema, Cyanosis   Lab Results: CBC  Recent Labs  12/04/13 0622 12/05/13 0620  WBC 1.3* 1.5*  HGB 10.0* 9.3*  HCT 29.0* 27.2*  PLT 30* 26*    BMET  Recent Labs  12/04/13 0622 12/05/13 0620  NA 138 140  K 4.0 4.1  CL 100 106  CO2 26 24  GLUCOSE 251* 180*  BUN 88* 76*  CREATININE 3.29* 2.85*  CALCIUM  8.6 8.2*   Creat Trend  2015 1.79==>3.29==>2.85 2014 1.4--1.8  2013 1.2--1.6  2009 1.47    MICRO Recent Results (from the past 240 hour([name]))  CULTURE, BLOOD (ROUTINE X 2)     Status: None   Collection Time    11/27/13  1:10 AM      Result Value Ref Range Status   Specimen Description BLOOD RIGHT ANTECUBITAL   Final   Special Requests BOTTLES DRAWN AEROBIC AND ANAEROBIC 6CC EACH   Final   Culture NO GROWTH 5 DAYS   Final   Report Status 12/02/2013 FINAL   Final  CULTURE, BLOOD (ROUTINE X 2)     Status: None   Collection Time    11/27/13  1:10 AM      Result Value Ref Range Status   Specimen Description BLOOD RIGHT ANTECUBITAL   Final   Special Requests     Final   Value: BOTTLES DRAWN AEROBIC AND ANAEROBIC AEB=5CC ANA=7CC   Culture NO GROWTH 5 DAYS   Final   Report Status 12/02/2013 FINAL   Final  MRSA PCR SCREENING     Status: None   Collection Time    11/27/13  3:32 AM  Result Value Ref Range Status   MRSA by PCR NEGATIVE  NEGATIVE Final   Comment:            The GeneXpert MRSA Assay (FDA     approved for NASAL specimens     only), is one component of a     comprehensive MRSA colonization     surveillance program. It is not     intended to diagnose MRSA     infection nor to guide or     monitor treatment for     MRSA infections.  URINE CULTURE     Status: None   Collection Time    11/27/13  5:23 AM      Result Value Ref Range Status   Specimen Description URINE, CLEAN CATCH   Final   Special Requests NONE   Final   Culture  Setup Time     Final   Value: 11/27/2013 10:30     Performed at [facility]     Final   Value: 20,OOO COLONIES/ML     Performed at [facility]   Culture     Final   Value: ENTEROCOCCUS SPECIES     Performed at [facility]   Report Status 11/29/2013 FINAL   Final   Organism ID, Bacteria ENTEROCOCCUS SPECIES   Final      Lab Results  Component Value Date   PTH 57.7 12/02/2013   CALCIUM  8.2* 12/05/2013   PHOS 5.7* 12/04/2013       Impression: 1)Renal AKI secondary to Multiple factors  SIRS- admitted with Pneumonia  Vanco Toxicity  NSAIDS  NSTEMI  Hypolemia- negative by 5 liters  AKI worsened with Creat now at 3.2 Creat was at plateau Now improving   CKD stage 3 .  CKD since 2009  CKD secondary to DM/ HTN/ Age asso decline/Post renal  Progression of CKD marked with AKI  Proteinura at goal 550mg  as per spot sample  2)HTN  Target Organ damage  CKD  CAD  CHF  Meds On Beta blockers  ON Vasodilators  3)Haem-  Anemia  Thrombocytopenia  Neutropenia  Sec to MDS.  Primary team and heam following   4)CKD Mineral-Bone Disorder  PTH acceptable  Secondary Hyperparathyroidism absent Phosphorus at goal  5)CAD- pt has NSTEMI  Cardiology and Primary MD following   6)Electrolytes  Normokalemic Normonatremic   7)Acid base  Co2 at goal       Plan:  Will continue current care Will follow bmet      [name] 12/05/2013, 9:27 AM

## 2013-12-05 NOTE — Progress Notes (Signed)
CRITICAL VALUE ALERT  Critical value received:  platelets  Date of notification:  12/05/13  Time of notification:  0652  Critical value read back: yes  Nurse who received alert:  Rosalee Kaufman RN  MD notified (1st page): ) Luan Pulling  Time of first page:  0655  MD notified (2nd page):  Time of second page:  Responding MD: Luan Pulling  Time MD responded:  [id number]

## 2013-12-05 NOTE — Progress Notes (Signed)
Physical Therapy Treatment Patient Details Name: [name] MRN: [id number] DOB: 1932/01/19 Today's Date: 12/05/2013 Time: 0932-6712 PT Time Calculation (min): 31 min  PT Assessment / Plan / Recommendation  History of Present Illness     PT Comments   Pt with min assistance bed mobility with verbal cueing for hand placement.  Decreased core strength requiring HHA with sitting balance.  Mod assistance with sit to stand and stand pivot transfer with cueing for handplacement to assist.  Pt limited by fatigue, no c/o pain through session.  Pt left in chair with call bell within reach and chair alarm set.  Follow Up Recommendations        Does the patient have the potential to tolerate intense rehabilitation     Barriers to Discharge        Equipment Recommendations       Recommendations for Other Services    Frequency     Progress towards PT Goals Progress towards PT goals: Progressing toward goals  Plan Current plan remains appropriate    Precautions / Restrictions Precautions Precautions: Fall Restrictions Weight Bearing Restrictions: No    Mobility  Bed Mobility Bed Mobility: Supine to Sit;Sit to Supine Supine to sit: Min assist General bed mobility comments: cueing for handplacement to assist  Transfers Equipment used: Rolling walker (2 wheeled) Transfers: Sit to/from [facility] Sit to Stand: Mod assist Stand pivot transfers: Mod assist General transfer comment: pt is very unsteady in stance    Exercises General Exercises - Lower Extremity Ankle Circles/Pumps: AROM;Both;10 reps;Supine Long Arc Quad: AROM;Both;10 reps;Seated   PT Diagnosis:    PT Problem List:   PT Treatment Interventions:     PT Goals (current goals can now be found in the care plan section)    Visit Information  Last PT Received On: 12/05/13    Subjective Data   Pt stated he was tired, difficulty sleeping last night.   Cognition  Cognition Arousal/Alertness:  Awake/alert Behavior During Therapy: WFL for tasks assessed/performed Overall Cognitive Status: Within Functional Limits for tasks assessed    Balance     End of Session PT - End of Session Equipment Utilized During Treatment: Gait belt Activity Tolerance: Patient limited by fatigue Patient left: in chair;with call bell/phone within reach;with chair alarm set   GP     Aldona Lento 12/05/2013, 11:12 AM

## 2013-12-05 NOTE — Progress Notes (Signed)
Subjective: He feels about the same. He has no new complaints. He says he is still congested in his chest.  Objective: Vital signs in last 24 hours: Temp:  [97.8 F (36.6 C)-98.1 F (36.7 C)] 98 F (36.7 C) (03/11 0458) Pulse Rate:  [62-70] 62 (03/11 0740) Resp:  [16-20] 16 (03/11 0740) BP: (116-125)/(47-60) 125/47 mmHg (03/11 0458) SpO2:  [92 %-97 %] 92 % (03/11 0740) Weight change:  Last BM Date: 11/28/13  Intake/Output from previous day: 03/10 0701 - 03/11 0700 In: 2900 [P.O.:500; I.V.:2400] Out: 2600 [Urine:2600]  PHYSICAL EXAM General appearance: alert, cooperative and mild distress Resp: rhonchi bilaterally Cardio: irregularly irregular rhythm GI: soft, non-tender; bowel sounds normal; no masses,  no organomegaly Extremities: extremities normal, atraumatic, no cyanosis or edema  Lab Results:    Basic Metabolic Panel:  Recent Labs  12/04/13 0622 12/05/13 0620  NA 138 140  K 4.0 4.1  CL 100 106  CO2 26 24  GLUCOSE 251* 180*  BUN 88* 76*  CREATININE 3.29* 2.85*  CALCIUM 8.6 8.2*  PHOS 5.7*  --    Liver Function Tests: No results found for this basename: AST, ALT, ALKPHOS, BILITOT, PROT, ALBUMIN,  in the last 72 hours No results found for this basename: LIPASE, AMYLASE,  in the last 72 hours No results found for this basename: AMMONIA,  in the last 72 hours CBC:  Recent Labs  12/04/13 0622 12/05/13 0620  WBC 1.3* 1.5*  NEUTROABS 0.9* 1.1*  HGB 10.0* 9.3*  HCT 29.0* 27.2*  MCV 89.2 90.1  PLT 30* 26*   Cardiac Enzymes: No results found for this basename: CKTOTAL, CKMB, CKMBINDEX, TROPONINI,  in the last 72 hours BNP: No results found for this basename: PROBNP,  in the last 72 hours D-Dimer: No results found for this basename: DDIMER,  in the last 72 hours CBG:  Recent Labs  12/04/13 1152 12/04/13 1629 12/04/13 2048 12/04/13 2347 12/05/13 0411 12/05/13 0744  GLUCAP 244* 220* 240* 224* 168* 152*   Hemoglobin A1C:  Recent Labs  12/04/13 0622  HGBA1C 7.0*   Fasting Lipid Panel: No results found for this basename: CHOL, HDL, LDLCALC, TRIG, CHOLHDL, LDLDIRECT,  in the last 72 hours Thyroid Function Tests: No results found for this basename: TSH, T4TOTAL, FREET4, T3FREE, THYROIDAB,  in the last 72 hours Anemia Panel: No results found for this basename: VITAMINB12, FOLATE, FERRITIN, TIBC, IRON, RETICCTPCT,  in the last 72 hours Coagulation: No results found for this basename: LABPROT, INR,  in the last 72 hours Urine Drug Screen: Drugs of Abuse  No results found for this basename: labopia, cocainscrnur, labbenz, amphetmu, thcu, labbarb    Alcohol Level: No results found for this basename: ETH,  in the last 72 hours Urinalysis: No results found for this basename: COLORURINE, APPERANCEUR, LABSPEC, PHURINE, GLUCOSEU, HGBUR, BILIRUBINUR, KETONESUR, PROTEINUR, UROBILINOGEN, NITRITE, LEUKOCYTESUR,  in the last 72 hours Misc. Labs:  ABGS No results found for this basename: PHART, PCO2, PO2ART, TCO2, HCO3,  in the last 72 hours CULTURES Recent Results (from the past 240 hour(s))  CULTURE, BLOOD (ROUTINE X 2)     Status: None   Collection Time    11/27/13  1:10 AM      Result Value Ref Range Status   Specimen Description BLOOD RIGHT ANTECUBITAL   Final   Special Requests BOTTLES DRAWN AEROBIC AND ANAEROBIC [place]   Final   Culture NO GROWTH 5 DAYS   Final   Report Status 12/02/2013 FINAL   Final  CULTURE, BLOOD (ROUTINE X 2)     Status: None   Collection Time    11/27/13  1:10 AM      Result Value Ref Range Status   Specimen Description BLOOD RIGHT ANTECUBITAL   Final   Special Requests     Final   Value: BOTTLES DRAWN AEROBIC AND ANAEROBIC AEB=5CC ANA=7CC   Culture NO GROWTH 5 DAYS   Final   Report Status 12/02/2013 FINAL   Final  MRSA PCR SCREENING     Status: None   Collection Time    11/27/13  3:32 AM      Result Value Ref Range Status   MRSA by PCR NEGATIVE  NEGATIVE Final   Comment:            The  GeneXpert MRSA Assay (FDA     approved for NASAL specimens     only), is one component of a     comprehensive MRSA colonization     surveillance program. It is not     intended to diagnose MRSA     infection nor to guide or     monitor treatment for     MRSA infections.  URINE CULTURE     Status: None   Collection Time    11/27/13  5:23 AM      Result Value Ref Range Status   Specimen Description URINE, CLEAN CATCH   Final   Special Requests NONE   Final   Culture  Setup Time     Final   Value: 11/27/2013 10:30     Performed at [facility] Count     Final   Value: 20,OOO COLONIES/ML     Performed at [facility]   Culture     Final   Value: ENTEROCOCCUS SPECIES     Performed at [facility]   Report Status 11/29/2013 FINAL   Final   Organism ID, Bacteria ENTEROCOCCUS SPECIES   Final   Studies/Results: US Renal  12/03/2013   CLINICAL DATA:  Assess for possible obstruction, evaluate renal size.  EXAM: RENAL/URINARY TRACT ULTRASOUND COMPLETE  COMPARISON:  None.  FINDINGS: Right Kidney:  Length: 10.7 cm. The echotexture of the renal cortex is lower than that of the adjacent liver which is normal. There is an upper pole cyst measuring 2.6 cm in diameter.  Left Kidney:  Length: 10.6 cm. Echogenicity within normal limits. No mass or hydronephrosis visualized.  Bladder:  The prostate gland is enlarged and produces a moderate impression upon the urinary bladder base. No acute bladder abnormality is demonstrated.  IMPRESSION: 1. The kidneys demonstrate normal size and echotexture. There is no evidence of hydronephrosis. 2. There is a simple appearing cyst in the upper pole of the right kidney. 3. There is prostatic enlargement with produces a prominent impression upon the urinary bladder base.   Electronically Signed   By: David  Martinique   On: 12/03/2013 09:26    Medications:  Prior to Admission:  Prescriptions prior to admission  Medication Sig Dispense  Refill  . acetaminophen (TYLENOL) 500 MG tablet Take 1 tablet (500 mg total) by mouth every 6 (six) hours as needed. Pain  30 tablet    . albuterol (PROVENTIL HFA;VENTOLIN HFA) 108 (90 BASE) MCG/ACT inhaler Inhale 2 puffs into the lungs every 6 (six) hours as needed for wheezing.  1 Inhaler  2  . amiodarone (PACERONE) 200 MG tablet Take 0.5 tablets (100 mg total) by mouth daily.  30 tablet  5  . amitriptyline (ELAVIL) 10 MG tablet Take 30 mg by mouth at bedtime.      Marland Kitchen amLODipine (NORVASC) 5 MG tablet TAKE ONE TABLET BY MOUTH EVERY DAY FOR BLOOD PRESSURE  90 tablet  3  . aspirin EC 81 MG tablet Take 81 mg by mouth 2 (two) times daily.       . diazepam (VALIUM) 5 MG tablet Take 5 mg by mouth every 6 (six) hours as needed. Muscles Spasms      . furosemide (LASIX) 40 MG tablet Take 1 tablet (40 mg total) by mouth daily. Take one half tablet daily on a routine basis. If your weight goes up by 3 pounds or if you have edema of your legs take one full tablet twice a day for 3 days  30 tablet  11  . HYDROcodone-acetaminophen (NORCO) 5-325 MG per tablet Take 1 tablet by mouth every 6 (six) hours as needed. Pain      . lenalidomide (REVLIMID) 10 MG capsule Take 1 capsule (10 mg total) by mouth daily.  28 capsule  0  . metFORMIN (GLUCOPHAGE) 500 MG tablet Take 500 mg by mouth 2 (two) times daily.       . metoprolol tartrate (LOPRESSOR) 25 MG tablet Take 25 mg by mouth 2 (two) times daily.        . Multiple Vitamin (MULTIVITAMIN) tablet Take 1 tablet by mouth daily.        . nitroGLYCERIN (NITROSTAT) 0.4 MG SL tablet Place 1 tablet (0.4 mg total) under the tongue every 5 (five) minutes x 3 doses as needed for chest pain (up to 3 doses).  25 tablet  3  . pregabalin (LYRICA) 75 MG capsule Take 75 mg by mouth daily.      . Red Yeast Rice 600 MG TABS Take 1 tablet by mouth daily.        . silodosin (RAPAFLO) 8 MG CAPS capsule Take 8 mg by mouth daily with breakfast.         Scheduled: . amiodarone  200 mg Oral  BID  . amitriptyline  30 mg Oral QHS  . antiseptic oral rinse  15 mL Mouth Rinse q12n4p  . aspirin EC  81 mg Oral BID  . azithromycin  500 mg Oral Daily  . ceFEPime (MAXIPIME) IV  2 g Intravenous Q24H  . insulin aspart  0-15 Units Subcutaneous 6 times per day  . ipratropium-albuterol  3 mL Nebulization Q4H  . isosorbide mononitrate  30 mg Oral Daily  . metoprolol tartrate  25 mg Oral BID  . potassium chloride  20 mEq Oral Daily  . predniSONE  20 mg Oral Q breakfast  . pregabalin  75 mg Oral Daily  . tamsulosin  0.4 mg Oral QPC supper   Continuous: . sodium chloride 100 mL/hr at 12/05/13 0600   IRS:[id number], bisacodyl, diazepam, heparin lock flush, morphine injection, nitroGLYCERIN, ondansetron (ZOFRAN) IV, polyethylene glycol, sodium chloride, sodium chloride  Assesment: He was admitted with community-acquired pneumonia. He is responding to treatment still having a lot of congestion.  He has known coronary artery occlusive disease and had a non-STEMI probably related to the acute stress of his pneumonia.  He had acute respiratory failure multifactorial and he's better with that.  He has known bone marrow abnormality with 5Q minus and after discussion with the oncology team plan is for him to have another bone marrow biopsy. This will be done tomorrow he continues with leukopenia and thrombocytopenia  and mild anemia  He had congestive heart failure on admission with his pneumonia myocardial infarction etc. and that has improved.  He had acute kidney injury multifactorial and that is improving Active Problems:   HCAP (healthcare-associated pneumonia)   Community acquired pneumonia   NSTEMI (non-ST elevated myocardial infarction)    Plan: For bone marrow biopsy tomorrow. Probable discharge to skilled care facility on the 13th    LOS: 8 days   Brady Schiller L 12/05/2013, 9:06 AM

## 2013-12-06 ENCOUNTER — Ambulatory Visit (HOSPITAL_COMMUNITY): Payer: Medicare Other

## 2013-12-06 ENCOUNTER — Ambulatory Visit (HOSPITAL_COMMUNITY)
Admit: 2013-12-06 | Discharge: 2013-12-06 | Disposition: A | Discharge status: Home / Self Care | Payer: Medicare Other | Attending: Oncology | Admitting: Oncology

## 2013-12-06 LAB — CBC WITH DIFFERENTIAL/PLATELET
Basophils Absolute: 0 10*3/uL (ref 0.0–0.1)
Basophils Relative: 1 % (ref 0–1)
Eosinophils Absolute: 0.1 10*3/uL (ref 0.0–0.7)
Eosinophils Relative: 2 % (ref 0–5)
HCT: 29.7 % — ABNORMAL LOW (ref 39.0–52.0)
Hemoglobin: 9.9 g/dL — ABNORMAL LOW (ref 13.0–17.0)
LYMPHS PCT: 13 % (ref 12–46)
Lymphs Abs: 0.4 10*3/uL — ABNORMAL LOW (ref 0.7–4.0)
MCH: 30.1 pg (ref 26.0–34.0)
MCHC: 33.3 g/dL (ref 30.0–36.0)
MCV: 90.3 fL (ref 78.0–100.0)
Monocytes Absolute: 0.2 10*3/uL (ref 0.1–1.0)
Monocytes Relative: 9 % (ref 3–12)
Neutro Abs: 2 10*3/uL (ref 1.7–7.7)
Neutrophils Relative %: 75 % (ref 43–77)
PLATELETS: 35 10*3/uL — AB (ref 150–400)
RBC: 3.29 MIL/uL — AB (ref 4.22–5.81)
RDW: 17.2 % — ABNORMAL HIGH (ref 11.5–15.5)
SMEAR REVIEW: DECREASED
WBC: 2.7 10*3/uL — AB (ref 4.0–10.5)

## 2013-12-06 LAB — GLUCOSE, CAPILLARY
GLUCOSE-CAPILLARY: 199 mg/dL — AB (ref 70–99)
Glucose-Capillary: 242 mg/dL — ABNORMAL HIGH (ref 70–99)
Glucose-Capillary: 258 mg/dL — ABNORMAL HIGH (ref 70–99)
Glucose-Capillary: 175 mg/dL — ABNORMAL HIGH (ref 70–99)
Glucose-Capillary: 177 mg/dL — ABNORMAL HIGH (ref 70–99)
Glucose-Capillary: 166 mg/dL — ABNORMAL HIGH (ref 70–99)

## 2013-12-06 LAB — BONE MARROW EXAM

## 2013-12-06 MED ORDER — FENTANYL CITRATE 0.05 MG/ML IJ SOLN
INTRAMUSCULAR | Status: AC
  Filled 2013-12-06: qty 4

## 2013-12-06 MED ORDER — MIDAZOLAM HCL 2 MG/2ML IJ SOLN
INTRAMUSCULAR | Status: AC | PRN
  Administered 2013-12-06: 1 mg via INTRAVENOUS

## 2013-12-06 MED ORDER — MIDAZOLAM HCL 2 MG/2ML IJ SOLN
INTRAMUSCULAR | Status: AC
  Filled 2013-12-06: qty 4

## 2013-12-06 MED ORDER — FENTANYL CITRATE 0.05 MG/ML IJ SOLN
INTRAMUSCULAR | Status: AC | PRN
  Administered 2013-12-06: 100 ug via INTRAVENOUS

## 2013-12-06 NOTE — Progress Notes (Signed)
Patient ID: [name], male   DOB: [birth year], 80 y.o.   MRN: [id number] Request received for CT guided bone marrow biopsy on pt with history of pancytopenia/MDS and recent admission for HCAP/NSTEMI to rule out progression of disease/leukemic transformation. Additional PMH as below. Exam: pt awake/alert/slightly HOH. Chest- dim BS bases, few rhonchi; heart- RRR; abd- soft,+BS,NT; ext- no edema.   Filed Vitals:   12/05/13 2033 12/05/13 2320 12/06/13 0344 12/06/13 0645  BP: 122/61  127/52   Pulse: 64  65   Temp: 97.9 F (36.6 C)  98.5 F (36.9 C)   TempSrc: Oral  Oral   Resp: 18  11   Height:      Weight:      SpO2: 96% 95% 95% 94%   Past Medical History  Diagnosis Date  . Essential hypertension, benign   . COPD (chronic obstructive pulmonary disease)   . Coronary atherosclerosis of native coronary artery     Multivessel status post CABG 1996  . Arthritis   . Atrial fibrillation     Coumadin stopped 12/2011 due to anemia  . Dysphagia   . Anemia     Requiring blood transfusions  . Carotid stenosis     Dr Scot Dock   . Diabetes mellitus, type 2   . Mixed hyperlipidemia     Statin intolerant  . Hematuria     Felt related to Foley insertion  . Cardiomyopathy     LVEF 45-50%  . Melena     EGD & Colonoscopy 09/2011 revealed gastritis, erosions, scars, diverticula, 8 colon polyps (largest 1.6cm, not removed 2/2 anticoagulation), small AVM in transverse colon  . History of tobacco abuse     Quit 1992  . Anxiety   . GERD (gastroesophageal reflux disease)   . Myocardial infarction   . Thrombocytopenia   . Myelodysplastic syndrome with 5 q minus 10/17/2012    On Revlimid 5 mg daily 21 days on and 7 days off  . Aortic stenosis     Mild  . CKD (chronic kidney disease) stage 3, GFR 30-59 ml/min    Past Surgical History  Procedure Laterality Date  . Knee surgery  1960    Right knee cartilage  . Rotator cuff repair  1990    right   . Inguinal hernia repair  2000 and 2010     left  . Cholecystectomy  05/2010    Lap chole with biologic mesh repair/reinforcement by  Dr Rise Patience  . Back surgery  02/2006    ruptured disk,  post op diskitis infection requiring prolonged hospital stay and  surgical I & D  . Tonsillectomy    . Esophagogastroduodenoscopy  10/08/2011    Procedure: ESOPHAGOGASTRODUODENOSCOPY (EGD);  Surgeon: [name], MD;  Location: [facility];  Service: Endoscopy;  Laterality: N/A;  . Colonoscopy  10/08/2011    Procedure: COLONOSCOPY;  Surgeon: [name], MD;  Location: [facility];  Service: Endoscopy;  Laterality: N/A;  . Cataract extraction, bilateral    . Peg placement  07/2006    placed due to prolonged infection, poor po intake, malnutrition during several week hospital stay resulting from ruptured disc that became infected following surgery  . Colonoscopy  01/14/2012    Procedure: COLONOSCOPY;  Surgeon: [name], MD;  Location: [facility];  Service: Endoscopy;  Laterality: N/A;  945  . Coronary artery bypass graft  1996    CABG x 4  . Ventral hernia repair    .  Colonoscopy  04/20/2012    Procedure: COLONOSCOPY;  Surgeon: [name], MD;  Location: [facility];  Service: Endoscopy;  Laterality: N/A;  1030  . Pr vein bypass graft,aorto-fem-pop  1996  . Spine surgery    . Joint replacement  1960    Right Knee  . Lymph node biopsy  08/15/2012    Procedure: LYMPH NODE BIOPSY;  Surgeon: Scherry Ran, MD;  Location: [facility];  Service: General;  Laterality: Left;  Cervical Lymph Node Bx in Minor Room  . Bone marrow biopsy    . Bone marrow aspiration    Dg Chest 2 View  12/01/2013   CLINICAL DATA:  History of community acquired pneumonia, now with vomiting and weakness  EXAM: CHEST  2 VIEW  COMPARISON:  DG CHEST 1V PORT dated 11/27/2013  FINDINGS: The lungs are adequately inflated. There is density in the left lower lobe consistent with pneumonia. The cardiac silhouette is mildly enlarged. The pulmonary vascularity is  prominent centrally, but no significant cephalization is demonstrated. The pulmonary interstitial markings are not abnormally increased. There are 8 intact sternal wires demonstrated. The patient has undergone previous CABG. The observed portions of the bony thorax appear normal.  IMPRESSION: 1. There is density in the left lower lobe consistent with pneumonia. This is slightly more conspicuous than on the earlier study. 2. The pulmonary interstitial markings have improved since the earlier study consistent with some resolution of CHF. Low-grade CHF likely remains.   Electronically Signed   By: David  Martinique   On: 12/01/2013 18:48   US Renal  12/03/2013   CLINICAL DATA:  Assess for possible obstruction, evaluate renal size.  EXAM: RENAL/URINARY TRACT ULTRASOUND COMPLETE  COMPARISON:  None.  FINDINGS: Right Kidney:  Length: 10.7 cm. The echotexture of the renal cortex is lower than that of the adjacent liver which is normal. There is an upper pole cyst measuring 2.6 cm in diameter.  Left Kidney:  Length: 10.6 cm. Echogenicity within normal limits. No mass or hydronephrosis visualized.  Bladder:  The prostate gland is enlarged and produces a moderate impression upon the urinary bladder base. No acute bladder abnormality is demonstrated.  IMPRESSION: 1. The kidneys demonstrate normal size and echotexture. There is no evidence of hydronephrosis. 2. There is a simple appearing cyst in the upper pole of the right kidney. 3. There is prostatic enlargement with produces a prominent impression upon the urinary bladder base.   Electronically Signed   By: David  Martinique   On: 12/03/2013 09:26   Dg Chest Portable 1 View  11/27/2013   CLINICAL DATA:  Shortness of breath  EXAM: PORTABLE CHEST - 1 VIEW  COMPARISON:  January 05, 2013  FINDINGS: Diffuse interstitial coarsening, mildly asymmetric to the right. Stable heart size and mediastinal contours. Changes of previous CABG. No significant effusion. No pneumothorax.  IMPRESSION:  1. Mild pulmonary edema. 2. Asymmetric opacification at the right base. Please ensure no pulmonary infectious symptoms to suggest pneumonia.   Electronically Signed   By: Jorje Guild M.D.   On: 11/27/2013 02:02  Results for orders placed during the hospital encounter of 11/27/13  URINE CULTURE      Result Value Ref Range   Specimen Description URINE, CLEAN CATCH     Special Requests NONE     Culture  Setup Time       Value: 11/27/2013 10:30     Performed at [facility]  Value: 20,OOO COLONIES/ML     Performed at [facility]   Culture       Value: ENTEROCOCCUS SPECIES     Performed at [facility]   Report Status 11/29/2013 FINAL     Organism ID, Bacteria ENTEROCOCCUS SPECIES    CULTURE, BLOOD (ROUTINE X 2)      Result Value Ref Range   Specimen Description BLOOD RIGHT ANTECUBITAL     Special Requests BOTTLES DRAWN AEROBIC AND ANAEROBIC 6CC EACH     Culture NO GROWTH 5 DAYS     Report Status 12/02/2013 FINAL    CULTURE, BLOOD (ROUTINE X 2)      Result Value Ref Range   Specimen Description BLOOD RIGHT ANTECUBITAL     Special Requests       Value: BOTTLES DRAWN AEROBIC AND ANAEROBIC AEB=5CC ANA=7CC   Culture NO GROWTH 5 DAYS     Report Status 12/02/2013 FINAL    MRSA PCR SCREENING      Result Value Ref Range   MRSA by PCR NEGATIVE  NEGATIVE  CBC WITH DIFFERENTIAL      Result Value Ref Range   WBC 1.7 (*) 4.0 - 10.5 K/uL   RBC 3.43 (*) 4.22 - 5.81 MIL/uL   Hemoglobin 10.5 (*) 13.0 - 17.0 g/dL   HCT 31.8 (*) 39.0 - 52.0 %   MCV 92.7  78.0 - 100.0 fL   MCH 30.6  26.0 - 34.0 pg   MCHC 33.0  30.0 - 36.0 g/dL   RDW 18.0 (*) 11.5 - 15.5 %   Platelets 39 (*) 150 - 400 K/uL   Neutrophils Relative % 33 (*) 43 - 77 %   Lymphocytes Relative 48 (*) 12 - 46 %   Monocytes Relative 9  3 - 12 %   Eosinophils Relative 6 (*) 0 - 5 %   Basophils Relative 4 (*) 0 - 1 %   Neutro Abs 0.6 (*) 1.7 - 7.7 K/uL   Lymphs Abs 0.7  0.7 - 4.0 K/uL    Monocytes Absolute 0.2  0.1 - 1.0 K/uL   Eosinophils Absolute 0.1  0.0 - 0.7 K/uL   Basophils Absolute 0.1  0.0 - 0.1 K/uL   RBC Morphology ELLIPTOCYTES     WBC Morphology WHITE COUNT CONFIRMED ON SMEAR     Smear Review PLATELET COUNT CONFIRMED BY SMEAR    BASIC METABOLIC PANEL      Result Value Ref Range   Sodium 141  137 - 147 mEq/L   Potassium 2.3 (*) 3.7 - 5.3 mEq/L   Chloride 98  96 - 112 mEq/L   CO2 23  19 - 32 mEq/L   Glucose, Bld 260 (*) 70 - 99 mg/dL   BUN 16  6 - 23 mg/dL   Creatinine, Ser 1.79 (*) 0.50 - 1.35 mg/dL   Calcium 8.8  8.4 - 10.5 mg/dL   GFR calc non Af Amer 34 (*) >90 mL/min   GFR calc Af Amer 40 (*) >90 mL/min  URINALYSIS, ROUTINE W REFLEX MICROSCOPIC      Result Value Ref Range   Color, Urine YELLOW  YELLOW   APPearance CLEAR  CLEAR   Specific Gravity, Urine 1.025  1.005 - 1.030   pH 5.5  5.0 - 8.0   Glucose, UA >1000 (*) NEGATIVE mg/dL   Hgb urine dipstick SMALL (*) NEGATIVE   Bilirubin Urine NEGATIVE  NEGATIVE   Ketones, ur TRACE (*) NEGATIVE mg/dL   Protein, ur 30 (*)  NEGATIVE mg/dL   Urobilinogen, UA 0.2  0.0 - 1.0 mg/dL   Nitrite NEGATIVE  NEGATIVE   Leukocytes, UA NEGATIVE  NEGATIVE  PRO B NATRIURETIC PEPTIDE      Result Value Ref Range   Pro B Natriuretic peptide (BNP) 1952.0 (*) 0 - 450 pg/mL  INFLUENZA PANEL BY PCR (TYPE A & B, H1N1)      Result Value Ref Range   Influenza A By PCR NEGATIVE  NEGATIVE   Influenza B By PCR NEGATIVE  NEGATIVE   H1N1 flu by pcr NOT DETECTED  NOT DETECTED  TROPONIN I      Result Value Ref Range   Troponin I <0.30  <0.30 ng/mL  URINE MICROSCOPIC-ADD ON      Result Value Ref Range   Squamous Epithelial / LPF RARE  RARE   RBC / HPF 3-6  <3 RBC/hpf  TROPONIN I      Result Value Ref Range   Troponin I 3.31 (*) <0.30 ng/mL  TROPONIN I      Result Value Ref Range   Troponin I 15.61 (*) <0.30 ng/mL  TROPONIN I      Result Value Ref Range   Troponin I 15.23 (*) <0.30 ng/mL  BASIC METABOLIC PANEL      Result  Value Ref Range   Sodium 143  137 - 147 mEq/L   Potassium 3.6 (*) 3.7 - 5.3 mEq/L   Chloride 106  96 - 112 mEq/L   CO2 25  19 - 32 mEq/L   Glucose, Bld 157 (*) 70 - 99 mg/dL   BUN 25 (*) 6 - 23 mg/dL   Creatinine, Ser 1.71 (*) 0.50 - 1.35 mg/dL   Calcium 8.5  8.4 - 10.5 mg/dL   GFR calc non Af Amer 36 (*) >90 mL/min   GFR calc Af Amer 42 (*) >90 mL/min  CBC WITH DIFFERENTIAL      Result Value Ref Range   WBC 1.4 (*) 4.0 - 10.5 K/uL   RBC 2.95 (*) 4.22 - 5.81 MIL/uL   Hemoglobin 9.1 (*) 13.0 - 17.0 g/dL   HCT 27.6 (*) 39.0 - 52.0 %   MCV 93.6  78.0 - 100.0 fL   MCH 30.8  26.0 - 34.0 pg   MCHC 33.0  30.0 - 36.0 g/dL   RDW 18.6 (*) 11.5 - 15.5 %   Platelets 41 (*) 150 - 400 K/uL   Neutrophils Relative % 59  43 - 77 %   Neutro Abs 0.9 (*) 1.7 - 7.7 K/uL   Lymphocytes Relative 20  12 - 46 %   Lymphs Abs 0.3 (*) 0.7 - 4.0 K/uL   Monocytes Relative 8  3 - 12 %   Monocytes Absolute 0.1  0.1 - 1.0 K/uL   Eosinophils Relative 9 (*) 0 - 5 %   Eosinophils Absolute 0.1  0.0 - 0.7 K/uL   Basophils Relative 4 (*) 0 - 1 %   Basophils Absolute 0.1  0.0 - 0.1 K/uL   Smear Review PLATELETS APPEAR DECREASED    PROTEIN ELECTROPHORESIS, SERUM      Result Value Ref Range   Total Protein ELP 5.9 (*) 6.0 - 8.3 g/dL   Albumin ELP 51.0 (*) 55.8 - 66.1 %   Alpha-1-Globulin 7.9 (*) 2.9 - 4.9 %   Alpha-2-Globulin 13.7 (*) 7.1 - 11.8 %   Beta Globulin 6.3  4.7 - 7.2 %   Beta 2 7.5 (*) 3.2 - 6.5 %   Gamma Globulin  13.6  11.1 - 18.8 %   M-Spike, % NOT DETECTED     SPE Interp. (NOTE)     Comment (NOTE)    IMMUNOFIXATION ELECTROPHORESIS      Result Value Ref Range   Total Protein ELP 6.0  6.0 - 8.3 g/dL   IgG (Immunoglobin G), Serum 790  650 - 1600 mg/dL   IgA 482 (*) 68 - 379 mg/dL   IgM, Serum 47 (*) 41 - 251 mg/dL   Immunofix Electr Int (NOTE)    LACTATE DEHYDROGENASE      Result Value Ref Range   LDH 232  94 - 250 U/L  KAPPA/LAMBDA LIGHT CHAINS      Result Value Ref Range   Kappa free light  chain 2.46 (*) 0.33 - 1.94 mg/dL   Lamda free light chains 4.10 (*) 0.57 - 2.63 mg/dL   Kappa, lamda light chain ratio 0.60  0.26 - 1.65  BETA 2 MICROGLOBULINE, SERUM      Result Value Ref Range   Beta-2 Microglobulin 4.08 (*) 1.01 - 1.73 mg/L  BASIC METABOLIC PANEL      Result Value Ref Range   Sodium 143  137 - 147 mEq/L   Potassium 3.1 (*) 3.7 - 5.3 mEq/L   Chloride 104  96 - 112 mEq/L   CO2 27  19 - 32 mEq/L   Glucose, Bld 186 (*) 70 - 99 mg/dL   BUN 31 (*) 6 - 23 mg/dL   Creatinine, Ser 1.88 (*) 0.50 - 1.35 mg/dL   Calcium 8.3 (*) 8.4 - 10.5 mg/dL   GFR calc non Af Amer 32 (*) >90 mL/min   GFR calc Af Amer 38 (*) >90 mL/min  CBC WITH DIFFERENTIAL      Result Value Ref Range   WBC 1.0 (*) 4.0 - 10.5 K/uL   RBC 2.85 (*) 4.22 - 5.81 MIL/uL   Hemoglobin 8.8 (*) 13.0 - 17.0 g/dL   HCT 26.9 (*) 39.0 - 52.0 %   MCV 94.4  78.0 - 100.0 fL   MCH 30.9  26.0 - 34.0 pg   MCHC 32.7  30.0 - 36.0 g/dL   RDW 18.3 (*) 11.5 - 15.5 %   Platelets 30 (*) 150 - 400 K/uL   Neutrophils Relative % 41 (*) 43 - 77 %   Neutro Abs 0.4 (*) 1.7 - 7.7 K/uL   Lymphocytes Relative 45  12 - 46 %   Lymphs Abs 0.5 (*) 0.7 - 4.0 K/uL   Monocytes Relative 6  3 - 12 %   Monocytes Absolute 0.1  0.1 - 1.0 K/uL   Eosinophils Relative 5  0 - 5 %   Eosinophils Absolute 0.1  0.0 - 0.7 K/uL   Basophils Relative 4 (*) 0 - 1 %   Basophils Absolute 0.0  0.0 - 0.1 K/uL  LACTIC ACID, PLASMA      Result Value Ref Range   Lactic Acid, Venous 1.3  0.5 - 2.2 mmol/L  PATHOLOGIST SMEAR REVIEW      Result Value Ref Range   Path Review Reviewed By Violet Baldy, M.D.    BASIC METABOLIC PANEL      Result Value Ref Range   Sodium 140  137 - 147 mEq/L   Potassium 3.1 (*) 3.7 - 5.3 mEq/L   Chloride 101  96 - 112 mEq/L   CO2 27  19 - 32 mEq/L   Glucose, Bld 161 (*) 70 - 99 mg/dL   BUN 32 (*)  6 - 23 mg/dL   Creatinine, Ser 2.15 (*) 0.50 - 1.35 mg/dL   Calcium 8.2 (*) 8.4 - 10.5 mg/dL   GFR calc non Af Amer 28 (*) >90  mL/min   GFR calc Af Amer 32 (*) >90 mL/min  MAGNESIUM      Result Value Ref Range   Magnesium 1.8  1.5 - 2.5 mg/dL  CBC WITH DIFFERENTIAL      Result Value Ref Range   WBC 1.4 (*) 4.0 - 10.5 K/uL   RBC 3.07 (*) 4.22 - 5.81 MIL/uL   Hemoglobin 9.5 (*) 13.0 - 17.0 g/dL   HCT 27.9 (*) 39.0 - 52.0 %   MCV 90.9  78.0 - 100.0 fL   MCH 30.9  26.0 - 34.0 pg   MCHC 34.1  30.0 - 36.0 g/dL   RDW 18.7 (*) 11.5 - 15.5 %   Platelets 27 (*) 150 - 400 K/uL   Neutrophils Relative % 47  43 - 77 %   Neutro Abs 0.6 (*) 1.7 - 7.7 K/uL   Lymphocytes Relative 37  12 - 46 %   Lymphs Abs 0.5 (*) 0.7 - 4.0 K/uL   Monocytes Relative 7  3 - 12 %   Monocytes Absolute 0.1  0.1 - 1.0 K/uL   Eosinophils Relative 7 (*) 0 - 5 %   Eosinophils Absolute 0.1  0.0 - 0.7 K/uL   Basophils Relative 2 (*) 0 - 1 %   Basophils Absolute 0.0  0.0 - 0.1 K/uL  VANCOMYCIN, TROUGH      Result Value Ref Range   Vancomycin Tr 23.8 (*) 10.0 - 20.0 ug/mL  BASIC METABOLIC PANEL      Result Value Ref Range   Sodium 137  137 - 147 mEq/L   Potassium 3.7  3.7 - 5.3 mEq/L   Chloride 97  96 - 112 mEq/L   CO2 25  19 - 32 mEq/L   Glucose, Bld 387 (*) 70 - 99 mg/dL   BUN 46 (*) 6 - 23 mg/dL   Creatinine, Ser 2.44 (*) 0.50 - 1.35 mg/dL   Calcium 8.8  8.4 - 10.5 mg/dL   GFR calc non Af Amer 24 (*) >90 mL/min   GFR calc Af Amer 27 (*) >90 mL/min  BASIC METABOLIC PANEL      Result Value Ref Range   Sodium 136 (*) 137 - 147 mEq/L   Potassium 4.1  3.7 - 5.3 mEq/L   Chloride 95 (*) 96 - 112 mEq/L   CO2 27  19 - 32 mEq/L   Glucose, Bld 478 (*) 70 - 99 mg/dL   BUN 60 (*) 6 - 23 mg/dL   Creatinine, Ser 2.71 (*) 0.50 - 1.35 mg/dL   Calcium 9.1  8.4 - 10.5 mg/dL   GFR calc non Af Amer 21 (*) >90 mL/min   GFR calc Af Amer 24 (*) >90 mL/min  PHOSPHORUS      Result Value Ref Range   Phosphorus 4.0  2.3 - 4.6 mg/dL  CREATININE, URINE, RANDOM      Result Value Ref Range   Creatinine, Urine 40.84    SODIUM, URINE, RANDOM      Result  Value Ref Range   Sodium, Ur 26    PTH, INTACT AND CALCIUM      Result Value Ref Range   PTH 57.7  14.0 - 72.0 pg/mL   Calcium, Total (PTH) 8.5  8.4 - 10.5 mg/dL  PROTEIN, URINE, RANDOM  Result Value Ref Range   Total Protein, Urine 22    BASIC METABOLIC PANEL      Result Value Ref Range   Sodium 138  137 - 147 mEq/L   Potassium 3.8  3.7 - 5.3 mEq/L   Chloride 97  96 - 112 mEq/L   CO2 26  19 - 32 mEq/L   Glucose, Bld 280 (*) 70 - 99 mg/dL   BUN 80 (*) 6 - 23 mg/dL   Creatinine, Ser 3.15 (*) 0.50 - 1.35 mg/dL   Calcium 8.9  8.4 - 10.5 mg/dL   GFR calc non Af Amer 17 (*) >90 mL/min   GFR calc Af Amer 20 (*) >90 mL/min  GLUCOSE, CAPILLARY      Result Value Ref Range   Glucose-Capillary 507 (*) 70 - 99 mg/dL   Comment 1 Notify RN    GLUCOSE, CAPILLARY      Result Value Ref Range   Glucose-Capillary 413 (*) 70 - 99 mg/dL  GLUCOSE, CAPILLARY      Result Value Ref Range   Glucose-Capillary 365 (*) 70 - 99 mg/dL  GLUCOSE, CAPILLARY      Result Value Ref Range   Glucose-Capillary 226 (*) 70 - 99 mg/dL  GLUCOSE, CAPILLARY      Result Value Ref Range   Glucose-Capillary 272 (*) 70 - 99 mg/dL  CBC WITH DIFFERENTIAL      Result Value Ref Range   WBC 1.2 (*) 4.0 - 10.5 K/uL   RBC 3.34 (*) 4.22 - 5.81 MIL/uL   Hemoglobin 9.9 (*) 13.0 - 17.0 g/dL   HCT 30.0 (*) 39.0 - 52.0 %   MCV 89.8  78.0 - 100.0 fL   MCH 29.6  26.0 - 34.0 pg   MCHC 33.0  30.0 - 36.0 g/dL   RDW 17.5 (*) 11.5 - 15.5 %   Platelets 31 (*) 150 - 400 K/uL   Neutrophils Relative % 74  43 - 77 %   Neutro Abs 0.9 (*) 1.7 - 7.7 K/uL   Lymphocytes Relative 17  12 - 46 %   Lymphs Abs 0.2 (*) 0.7 - 4.0 K/uL   Monocytes Relative 9  3 - 12 %   Monocytes Absolute 0.1  0.1 - 1.0 K/uL   Eosinophils Relative 0  0 - 5 %   Eosinophils Absolute 0.0  0.0 - 0.7 K/uL   Basophils Relative 0  0 - 1 %   Basophils Absolute 0.0  0.0 - 0.1 K/uL  GLUCOSE, CAPILLARY      Result Value Ref Range   Glucose-Capillary 357 (*) 70 - 99  mg/dL  GLUCOSE, CAPILLARY      Result Value Ref Range   Glucose-Capillary 307 (*) 70 - 99 mg/dL  BASIC METABOLIC PANEL      Result Value Ref Range   Sodium 138  137 - 147 mEq/L   Potassium 4.0  3.7 - 5.3 mEq/L   Chloride 100  96 - 112 mEq/L   CO2 26  19 - 32 mEq/L   Glucose, Bld 251 (*) 70 - 99 mg/dL   BUN 88 (*) 6 - 23 mg/dL   Creatinine, Ser 3.29 (*) 0.50 - 1.35 mg/dL   Calcium 8.6  8.4 - 10.5 mg/dL   GFR calc non Af Amer 16 (*) >90 mL/min   GFR calc Af Amer 19 (*) >90 mL/min  CBC WITH DIFFERENTIAL      Result Value Ref Range   WBC 1.3 (*) 4.0 - 10.5  K/uL   RBC 3.25 (*) 4.22 - 5.81 MIL/uL   Hemoglobin 10.0 (*) 13.0 - 17.0 g/dL   HCT 29.0 (*) 39.0 - 52.0 %   MCV 89.2  78.0 - 100.0 fL   MCH 30.8  26.0 - 34.0 pg   MCHC 34.5  30.0 - 36.0 g/dL   RDW 16.9 (*) 11.5 - 15.5 %   Platelets 30 (*) 150 - 400 K/uL   Neutrophils Relative % 65  43 - 77 %   Neutro Abs 0.9 (*) 1.7 - 7.7 K/uL   Lymphocytes Relative 19  12 - 46 %   Lymphs Abs 0.3 (*) 0.7 - 4.0 K/uL   Monocytes Relative 13 (*) 3 - 12 %   Monocytes Absolute 0.2  0.1 - 1.0 K/uL   Eosinophils Relative 3  0 - 5 %   Eosinophils Absolute 0.0  0.0 - 0.7 K/uL   Basophils Relative 1  0 - 1 %   Basophils Absolute 0.0  0.0 - 0.1 K/uL  GLUCOSE, CAPILLARY      Result Value Ref Range   Glucose-Capillary 343 (*) 70 - 99 mg/dL  PHOSPHORUS      Result Value Ref Range   Phosphorus 5.7 (*) 2.3 - 4.6 mg/dL  GLUCOSE, CAPILLARY      Result Value Ref Range   Glucose-Capillary 271 (*) 70 - 99 mg/dL   Comment 1 Notify RN    GLUCOSE, CAPILLARY      Result Value Ref Range   Glucose-Capillary 253 (*) 70 - 99 mg/dL   Comment 1 Notify RN    GLUCOSE, CAPILLARY      Result Value Ref Range   Glucose-Capillary 201 (*) 70 - 99 mg/dL   Comment 1 Notify RN    GLUCOSE, CAPILLARY      Result Value Ref Range   Glucose-Capillary 233 (*) 70 - 99 mg/dL  HEMOGLOBIN A1C      Result Value Ref Range   Hemoglobin A1C 7.0 (*) <5.7 %   Mean Plasma Glucose  154 (*) <117 mg/dL  GLUCOSE, CAPILLARY      Result Value Ref Range   Glucose-Capillary 244 (*) 70 - 99 mg/dL  GLUCOSE, CAPILLARY      Result Value Ref Range   Glucose-Capillary 220 (*) 70 - 99 mg/dL   Comment 1 Notify RN    CBC WITH DIFFERENTIAL      Result Value Ref Range   WBC 1.5 (*) 4.0 - 10.5 K/uL   RBC 3.02 (*) 4.22 - 5.81 MIL/uL   Hemoglobin 9.3 (*) 13.0 - 17.0 g/dL   HCT 27.2 (*) 39.0 - 52.0 %   MCV 90.1  78.0 - 100.0 fL   MCH 30.8  26.0 - 34.0 pg   MCHC 34.2  30.0 - 36.0 g/dL   RDW 16.9 (*) 11.5 - 15.5 %   Platelets 26 (*) 150 - 400 K/uL   Neutrophils Relative % 70  43 - 77 %   Neutro Abs 1.1 (*) 1.7 - 7.7 K/uL   Lymphocytes Relative 19  12 - 46 %   Lymphs Abs 0.3 (*) 0.7 - 4.0 K/uL   Monocytes Relative 8  3 - 12 %   Monocytes Absolute 0.1  0.1 - 1.0 K/uL   Eosinophils Relative 2  0 - 5 %   Eosinophils Absolute 0.0  0.0 - 0.7 K/uL   Basophils Relative 1  0 - 1 %   Basophils Absolute 0.0  0.0 - 0.1 K/uL  Smear Review PLATELETS APPEAR DECREASED    BASIC METABOLIC PANEL      Result Value Ref Range   Sodium 140  137 - 147 mEq/L   Potassium 4.1  3.7 - 5.3 mEq/L   Chloride 106  96 - 112 mEq/L   CO2 24  19 - 32 mEq/L   Glucose, Bld 180 (*) 70 - 99 mg/dL   BUN 76 (*) 6 - 23 mg/dL   Creatinine, Ser 2.85 (*) 0.50 - 1.35 mg/dL   Calcium 8.2 (*) 8.4 - 10.5 mg/dL   GFR calc non Af Amer 20 (*) >90 mL/min   GFR calc Af Amer 23 (*) >90 mL/min  GLUCOSE, CAPILLARY      Result Value Ref Range   Glucose-Capillary 240 (*) 70 - 99 mg/dL   Comment 1 Notify RN    GLUCOSE, CAPILLARY      Result Value Ref Range   Glucose-Capillary 224 (*) 70 - 99 mg/dL   Comment 1 Documented in Chart     Comment 2 Notify RN    GLUCOSE, CAPILLARY      Result Value Ref Range   Glucose-Capillary 168 (*) 70 - 99 mg/dL   Comment 1 Documented in Chart     Comment 2 Notify RN    GLUCOSE, CAPILLARY      Result Value Ref Range   Glucose-Capillary 152 (*) 70 - 99 mg/dL  APTT      Result Value Ref  Range   aPTT 22 (*) 24 - 37 seconds  PROTIME-INR      Result Value Ref Range   Prothrombin Time 15.1  11.6 - 15.2 seconds   INR 1.22  0.00 - 1.49  GLUCOSE, CAPILLARY      Result Value Ref Range   Glucose-Capillary 187 (*) 70 - 99 mg/dL  GLUCOSE, CAPILLARY      Result Value Ref Range   Glucose-Capillary 327 (*) 70 - 99 mg/dL  CBC WITH DIFFERENTIAL      Result Value Ref Range   WBC 2.7 (*) 4.0 - 10.5 K/uL   RBC 3.29 (*) 4.22 - 5.81 MIL/uL   Hemoglobin 9.9 (*) 13.0 - 17.0 g/dL   HCT 29.7 (*) 39.0 - 52.0 %   MCV 90.3  78.0 - 100.0 fL   MCH 30.1  26.0 - 34.0 pg   MCHC 33.3  30.0 - 36.0 g/dL   RDW 17.2 (*) 11.5 - 15.5 %   Platelets 35 (*) 150 - 400 K/uL   Neutrophils Relative % 75  43 - 77 %   Neutro Abs 2.0  1.7 - 7.7 K/uL   Lymphocytes Relative 13  12 - 46 %   Lymphs Abs 0.4 (*) 0.7 - 4.0 K/uL   Monocytes Relative 9  3 - 12 %   Monocytes Absolute 0.2  0.1 - 1.0 K/uL   Eosinophils Relative 2  0 - 5 %   Eosinophils Absolute 0.1  0.0 - 0.7 K/uL   Basophils Relative 1  0 - 1 %   Basophils Absolute 0.0  0.0 - 0.1 K/uL   Smear Review PLATELETS APPEAR DECREASED    GLUCOSE, CAPILLARY      Result Value Ref Range   Glucose-Capillary 238 (*) 70 - 99 mg/dL   Comment 1 Notify RN     Comment 2 Documented in Chart    GLUCOSE, CAPILLARY      Result Value Ref Range   Glucose-Capillary 242 (*) 70 - 99 mg/dL  GLUCOSE, CAPILLARY  Result Value Ref Range   Glucose-Capillary 258 (*) 70 - 99 mg/dL   Comment 1 Notify RN     Comment 2 Documented in Chart    GLUCOSE, CAPILLARY      Result Value Ref Range   Glucose-Capillary 199 (*) 70 - 99 mg/dL  I-STAT CG4 LACTIC ACID, ED      Result Value Ref Range   Lactic Acid, Venous 4.57 (*) 0.5 - 2.2 mmol/L  POCT I-STAT TROPONIN I      Result Value Ref Range   Troponin i, poc 0.06  0.00 - 0.08 ng/mL   Comment 3           PREPARE RBC (CROSSMATCH)      Result Value Ref Range   Order Confirmation ORDER PROCESSED BY BLOOD BANK    TYPE AND SCREEN       Result Value Ref Range   ABO/RH(D) A NEG     Antibody Screen NEG     Sample Expiration 12/02/2013     Unit Number [id number]     Blood Component Type RBC, LR IRR     Unit division 00     Status of Unit ISSUED,FINAL     Transfusion Status OK TO TRANSFUSE     Crossmatch Result Compatible     Unit Number [id number]     Blood Component Type RBC, LR IRR     Unit division 00     Status of Unit ISSUED,FINAL     Transfusion Status OK TO TRANSFUSE     Crossmatch Result Compatible     A/P: Pt with history of pancytopenia/MDS and recent admission for HCAP/NSTEMI; plan is for CT guided bone marrow biopsy today  to rule out progression of disease/leukemic transformation. Details/risks of procedure d/w pt with his understanding and consent.

## 2013-12-06 NOTE — Procedures (Signed)
CT-guided  R iliac bone marrow aspiration and core biopsy No complication No blood loss. See complete dictation in [facility] PACS  

## 2013-12-06 NOTE — Progress Notes (Signed)
Subjective: Interval History: has no complaint of Nausea or vomiting. Difficulty in breathing seems to be improving. His Appetite is poor  Objective: Vital signs in last 24 hours: Temp:  [97.9 F (36.6 C)-98.5 F (36.9 C)] 98.5 F (36.9 C) (03/12 0344) Pulse Rate:  [64-65] 65 (03/12 0344) Resp:  [11-18] 11 (03/12 0344) BP: (122-127)/(52-62) 127/52 mmHg (03/12 0344) SpO2:  [93 %-98 %] 94 % (03/12 0645) Weight change:   Intake/Output from previous day: 03/11 0701 - 03/12 0700 In: 1185 [P.O.:120; I.V.:1015; IV Piggyback:50] Out: 1600 [Urine:1600] Intake/Output this shift:    General appearance: alert, cooperative and no distress Resp: diminished breath sounds posterior - bilateral and rhonchi bilaterally Cardio: regular rate and rhythm, S1, S2 normal, no murmur, click, rub or gallop GI: soft, non-tender; bowel sounds normal; no masses,  no organomegaly Extremities: edema Trace edema  Lab Results:  Recent Labs  12/05/13 0620 12/06/13 0548  WBC 1.5* 2.7*  HGB 9.3* 9.9*  HCT 27.2* 29.7*  PLT 26* 35*   BMET:   Recent Labs  12/04/13 0622 12/05/13 0620  NA 138 140  K 4.0 4.1  CL 100 106  CO2 26 24  GLUCOSE 251* 180*  BUN 88* 76*  CREATININE 3.29* 2.85*  CALCIUM 8.6 8.2*   No results found for this basename: PTH,  in the last 72 hours Iron Studies: No results found for this basename: IRON, TIBC, TRANSFERRIN, FERRITIN,  in the last 72 hours  Studies/Results: No results found.  I have reviewed the patient's current medications.  Assessment/Plan: Problem #1 acute kidney injury: His BUN and creatinine presently is improving. This could be from a cane/vancomycin-induced acute kidney injury/nonsteroidal [presently discontinued]. The present increasing BUN and creatinine also could be secondary to diuretics. Problem #2 pneumonia: Patient on antibiotics seems to be getting better. Problem #3 history of a trial fibrillation Problem #4 history of myelodysplastic  syndrome. Problem #5 coronary artery disease Problem #6 hypokalemia: His potassium has corrected Problem #7 metabolic bone disease: His calcium and phosphorus is in range. Problem #8 Anemia her H/H is stable . Etiology not clear. Iron deficiency anemia[GI bleeding] Vs Chronic diseases[Mayelodysplatic syndrome] CKD? Plan:We'll continue slow hydration We'll check his tablet panel.  Iron studies in am   LOS: 9 days   [name] S 12/06/2013,8:42 AM

## 2013-12-06 NOTE — Clinical Social Work Note (Signed)
CSW updated [facility] on pt. Pt to have bone marrow biopsy today and possible d/c to SNF tomorrow.   [name], [place]

## 2013-12-06 NOTE — Progress Notes (Signed)
Subjective: He says he feels better. He has less congestion. He is scheduled for a bone marrow biopsy today. He has no new complaints.  Objective: Vital signs in last 24 hours: Temp:  [97.9 F (36.6 C)-98.5 F (36.9 C)] 98.5 F (36.9 C) (03/12 0344) Pulse Rate:  [64-65] 65 (03/12 0344) Resp:  [11-18] 11 (03/12 0344) BP: (122-127)/(52-62) 127/52 mmHg (03/12 0344) SpO2:  [93 %-98 %] 94 % (03/12 0645) Weight change:  Last BM Date: 11/28/13  Intake/Output from previous day: 03/11 0701 - 03/12 0700 In: 1185 [P.O.:120; I.V.:1015; IV Piggyback:50] Out: 1600 [Urine:1600]  PHYSICAL EXAM General appearance: alert, cooperative and mild distress Resp: rhonchi bilaterally Cardio: irregularly irregular rhythm GI: soft, non-tender; bowel sounds normal; no masses,  no organomegaly Extremities: extremities normal, atraumatic, no cyanosis or edema  Lab Results:    Basic Metabolic Panel:  Recent Labs  12/04/13 0622 12/05/13 0620  NA 138 140  K 4.0 4.1  CL 100 106  CO2 26 24  GLUCOSE 251* 180*  BUN 88* 76*  CREATININE 3.29* 2.85*  CALCIUM 8.6 8.2*  PHOS 5.7*  --    Liver Function Tests: No results found for this basename: AST, ALT, ALKPHOS, BILITOT, PROT, ALBUMIN,  in the last 72 hours No results found for this basename: LIPASE, AMYLASE,  in the last 72 hours No results found for this basename: AMMONIA,  in the last 72 hours CBC:  Recent Labs  12/05/13 0620 12/06/13 0548  WBC 1.5* 2.7*  NEUTROABS 1.1* 2.0  HGB 9.3* 9.9*  HCT 27.2* 29.7*  MCV 90.1 90.3  PLT 26* 35*   Cardiac Enzymes: No results found for this basename: CKTOTAL, CKMB, CKMBINDEX, TROPONINI,  in the last 72 hours BNP: No results found for this basename: PROBNP,  in the last 72 hours D-Dimer: No results found for this basename: DDIMER,  in the last 72 hours CBG:  Recent Labs  12/05/13 1149 12/05/13 1650 12/05/13 2031 12/06/13 0308 12/06/13 0342 12/06/13 0750  GLUCAP 187* 327* 238* 242* 258*  199*   Hemoglobin A1C:  Recent Labs  12/04/13 0622  HGBA1C 7.0*   Fasting Lipid Panel: No results found for this basename: CHOL, HDL, LDLCALC, TRIG, CHOLHDL, LDLDIRECT,  in the last 72 hours Thyroid Function Tests: No results found for this basename: TSH, T4TOTAL, FREET4, T3FREE, THYROIDAB,  in the last 72 hours Anemia Panel: No results found for this basename: VITAMINB12, FOLATE, FERRITIN, TIBC, IRON, RETICCTPCT,  in the last 72 hours Coagulation:  Recent Labs  12/05/13 0935  LABPROT 15.1  INR 1.22   Urine Drug Screen: Drugs of Abuse  No results found for this basename: labopia, cocainscrnur, labbenz, amphetmu, thcu, labbarb    Alcohol Level: No results found for this basename: ETH,  in the last 72 hours Urinalysis: No results found for this basename: COLORURINE, APPERANCEUR, LABSPEC, PHURINE, GLUCOSEU, HGBUR, BILIRUBINUR, KETONESUR, PROTEINUR, UROBILINOGEN, NITRITE, LEUKOCYTESUR,  in the last 72 hours Misc. Labs:  ABGS No results found for this basename: PHART, PCO2, PO2ART, TCO2, HCO3,  in the last 72 hours CULTURES Recent Results (from the past 240 hour(s))  CULTURE, BLOOD (ROUTINE X 2)     Status: None   Collection Time    11/27/13  1:10 AM      Result Value Ref Range Status   Specimen Description BLOOD RIGHT ANTECUBITAL   Final   Special Requests BOTTLES DRAWN AEROBIC AND ANAEROBIC 6CC EACH   Final   Culture NO GROWTH 5 DAYS   Final  Report Status 12/02/2013 FINAL   Final  CULTURE, BLOOD (ROUTINE X 2)     Status: None   Collection Time    11/27/13  1:10 AM      Result Value Ref Range Status   Specimen Description BLOOD RIGHT ANTECUBITAL   Final   Special Requests     Final   Value: BOTTLES DRAWN AEROBIC AND ANAEROBIC AEB=5CC ANA=7CC   Culture NO GROWTH 5 DAYS   Final   Report Status 12/02/2013 FINAL   Final  MRSA PCR SCREENING     Status: None   Collection Time    11/27/13  3:32 AM      Result Value Ref Range Status   MRSA by PCR NEGATIVE  NEGATIVE  Final   Comment:            The GeneXpert MRSA Assay (FDA     approved for NASAL specimens     only), is one component of a     comprehensive MRSA colonization     surveillance program. It is not     intended to diagnose MRSA     infection nor to guide or     monitor treatment for     MRSA infections.  URINE CULTURE     Status: None   Collection Time    11/27/13  5:23 AM      Result Value Ref Range Status   Specimen Description URINE, CLEAN CATCH   Final   Special Requests NONE   Final   Culture  Setup Time     Final   Value: 11/27/2013 10:30     Performed at [facility]     Final   Value: 20,OOO COLONIES/ML     Performed at [facility]   Culture     Final   Value: ENTEROCOCCUS SPECIES     Performed at [facility]   Report Status 11/29/2013 FINAL   Final   Organism ID, Bacteria ENTEROCOCCUS SPECIES   Final   Studies/Results: No results found.  Medications:  Prior to Admission:  Prescriptions prior to admission  Medication Sig Dispense Refill  . acetaminophen (TYLENOL) 500 MG tablet Take 1 tablet (500 mg total) by mouth every 6 (six) hours as needed. Pain  30 tablet    . albuterol (PROVENTIL HFA;VENTOLIN HFA) 108 (90 BASE) MCG/ACT inhaler Inhale 2 puffs into the lungs every 6 (six) hours as needed for wheezing.  1 Inhaler  2  . amiodarone (PACERONE) 200 MG tablet Take 0.5 tablets (100 mg total) by mouth daily.  30 tablet  5  . amitriptyline (ELAVIL) 10 MG tablet Take 30 mg by mouth at bedtime.      Marland Kitchen amLODipine (NORVASC) 5 MG tablet TAKE ONE TABLET BY MOUTH EVERY DAY FOR BLOOD PRESSURE  90 tablet  3  . aspirin EC 81 MG tablet Take 81 mg by mouth 2 (two) times daily.       . diazepam (VALIUM) 5 MG tablet Take 5 mg by mouth every 6 (six) hours as needed. Muscles Spasms      . furosemide (LASIX) 40 MG tablet Take 1 tablet (40 mg total) by mouth daily. Take one half tablet daily on a routine basis. If your weight goes up by 3 pounds or  if you have edema of your legs take one full tablet twice a day for 3 days  30 tablet  11  . HYDROcodone-acetaminophen (NORCO) 5-325 MG per tablet Take  1 tablet by mouth every 6 (six) hours as needed. Pain      . lenalidomide (REVLIMID) 10 MG capsule Take 1 capsule (10 mg total) by mouth daily.  28 capsule  0  . metFORMIN (GLUCOPHAGE) 500 MG tablet Take 500 mg by mouth 2 (two) times daily.       . metoprolol tartrate (LOPRESSOR) 25 MG tablet Take 25 mg by mouth 2 (two) times daily.        . Multiple Vitamin (MULTIVITAMIN) tablet Take 1 tablet by mouth daily.        . nitroGLYCERIN (NITROSTAT) 0.4 MG SL tablet Place 1 tablet (0.4 mg total) under the tongue every 5 (five) minutes x 3 doses as needed for chest pain (up to 3 doses).  25 tablet  3  . pregabalin (LYRICA) 75 MG capsule Take 75 mg by mouth daily.      . Red Yeast Rice 600 MG TABS Take 1 tablet by mouth daily.        . silodosin (RAPAFLO) 8 MG CAPS capsule Take 8 mg by mouth daily with breakfast.         Scheduled: . amiodarone  200 mg Oral BID  . amitriptyline  30 mg Oral QHS  . antiseptic oral rinse  15 mL Mouth Rinse q12n4p  . aspirin EC  81 mg Oral BID  . azithromycin  500 mg Oral Daily  . ceFEPime (MAXIPIME) IV  2 g Intravenous Q24H  . insulin aspart  0-15 Units Subcutaneous 6 times per day  . ipratropium-albuterol  3 mL Nebulization Q4H  . isosorbide mononitrate  30 mg Oral Daily  . metoprolol tartrate  25 mg Oral BID  . potassium chloride  20 mEq Oral Daily  . predniSONE  20 mg Oral Q breakfast  . pregabalin  75 mg Oral Daily  . tamsulosin  0.4 mg Oral QPC supper   Continuous: . sodium chloride 100 mL/hr at 12/06/13 5826   YYO:CHVGWGYFEETOL, bisacodyl, diazepam, heparin lock flush, morphine injection, nitroGLYCERIN, ondansetron (ZOFRAN) IV, polyethylene glycol, sodium chloride, sodium chloride  Assesment: He was admitted with community-acquired pneumonia. He had acute respiratory failure from that. He had a non-STEMI  myocardial infarction. He developed acute renal failure. He has been having trouble with congestive heart failure which is improved. I think his renal failure is because of his acute illness and also because of diuresis for his congestive heart failure. 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

## 2013-12-07 ENCOUNTER — Inpatient Hospital Stay (HOSPITAL_COMMUNITY): Payer: Medicare Other

## 2013-12-07 ENCOUNTER — Other Ambulatory Visit (HOSPITAL_COMMUNITY): Payer: Self-pay | Admitting: Oncology

## 2013-12-07 DIAGNOSIS — B370 Candidal stomatitis: Secondary | ICD-10-CM

## 2013-12-07 DIAGNOSIS — N19 Unspecified kidney failure: Secondary | ICD-10-CM

## 2013-12-07 LAB — GLUCOSE, CAPILLARY
GLUCOSE-CAPILLARY: 118 mg/dL — AB (ref 70–99)
GLUCOSE-CAPILLARY: 147 mg/dL — AB (ref 70–99)
GLUCOSE-CAPILLARY: 240 mg/dL — AB (ref 70–99)
Glucose-Capillary: 149 mg/dL — ABNORMAL HIGH (ref 70–99)
Glucose-Capillary: 136 mg/dL — ABNORMAL HIGH (ref 70–99)
Glucose-Capillary: 153 mg/dL — ABNORMAL HIGH (ref 70–99)

## 2013-12-07 LAB — CBC WITH DIFFERENTIAL/PLATELET
BASOS ABS: 0.1 10*3/uL (ref 0.0–0.1)
Basophils Relative: 2 % — ABNORMAL HIGH (ref 0–1)
Eosinophils Absolute: 0.2 10*3/uL (ref 0.0–0.7)
Eosinophils Relative: 5 % (ref 0–5)
HCT: 33.6 % — ABNORMAL LOW (ref 39.0–52.0)
HEMOGLOBIN: 11 g/dL — AB (ref 13.0–17.0)
LYMPHS PCT: 17 % (ref 12–46)
Lymphs Abs: 0.7 10*3/uL (ref 0.7–4.0)
MCH: 30 pg (ref 26.0–34.0)
MCHC: 32.7 g/dL (ref 30.0–36.0)
MCV: 91.6 fL (ref 78.0–100.0)
Monocytes Absolute: 0.3 10*3/uL (ref 0.1–1.0)
Monocytes Relative: 7 % (ref 3–12)
NEUTROS ABS: 2.9 10*3/uL (ref 1.7–7.7)
Neutrophils Relative %: 70 % (ref 43–77)
Platelets: 45 10*3/uL — ABNORMAL LOW (ref 150–400)
RBC: 3.67 MIL/uL — ABNORMAL LOW (ref 4.22–5.81)
RDW: 17.3 % — AB (ref 11.5–15.5)
Smear Review: DECREASED
WBC: 4.1 10*3/uL (ref 4.0–10.5)

## 2013-12-07 LAB — BASIC METABOLIC PANEL
BUN: 56 mg/dL — ABNORMAL HIGH (ref 6–23)
CO2: 23 mEq/L (ref 19–32)
Calcium: 8.3 mg/dL — ABNORMAL LOW (ref 8.4–10.5)
Chloride: 109 mEq/L (ref 96–112)
Creatinine, Ser: 2.42 mg/dL — ABNORMAL HIGH (ref 0.50–1.35)
GFR calc non Af Amer: 24 mL/min — ABNORMAL LOW (ref >90)
GFR, EST AFRICAN AMERICAN: 28 mL/min — AB (ref >90)
Glucose, Bld: 161 mg/dL — ABNORMAL HIGH (ref 70–99)
POTASSIUM: 4.3 meq/L (ref 3.7–5.3)
SODIUM: 143 meq/L (ref 137–147)

## 2013-12-07 LAB — FERRITIN: Ferritin: 2567 ng/mL — ABNORMAL HIGH (ref 22–322)

## 2013-12-07 LAB — IRON AND TIBC
IRON: 35 ug/dL — AB (ref 42–135)
SATURATION RATIOS: 25 % (ref 20–55)
TIBC: 140 ug/dL — AB (ref 215–435)
UIBC: 105 ug/dL — ABNORMAL LOW (ref 125–400)

## 2013-12-07 MED ORDER — FLUCONAZOLE IN SODIUM CHLORIDE 200-0.9 MG/100ML-% IV SOLN
200.0000 mg | INTRAVENOUS | Status: DC
  Administered 2013-12-07 – 2013-12-08 (×2): 200 mg via INTRAVENOUS
  Filled 2013-12-07 (×4): qty 100

## 2013-12-07 MED ORDER — POTASSIUM CHLORIDE CRYS ER 20 MEQ PO TBCR
20.0000 meq | EXTENDED_RELEASE_TABLET | Freq: Every day | ORAL | Status: DC
  Administered 2013-12-08: 20 meq via ORAL
  Filled 2013-12-07: qty 1

## 2013-12-07 MED ORDER — FUROSEMIDE 10 MG/ML IJ SOLN
40.0000 mg | Freq: Once | INTRAMUSCULAR | Status: AC
  Administered 2013-12-07: 40 mg via INTRAVENOUS
  Filled 2013-12-07: qty 4

## 2013-12-07 MED ORDER — NYSTATIN 100000 UNIT/ML MT SUSP
5.0000 mL | Freq: Four times a day (QID) | OROMUCOSAL | Status: DC
  Administered 2013-12-07 – 2013-12-08 (×3): 500000 [IU] via ORAL
  Filled 2013-12-07 (×4): qty 5

## 2013-12-07 NOTE — Clinical Social Work Note (Signed)
Update given to [facility], are willing to accept patient on Sat AM.  MD aware.  Edwyna Shell, LCSW Clinical Social Worker [phone])

## 2013-12-07 NOTE — Progress Notes (Signed)
[place] for Cefepime & Zithromax Indication: pneumonia  Allergies  Allergen Reactions  . Statins Other (See Comments)    Causes legs to hurt.   . Tape     Causes skin to peel off.    Patient Measurements: Height: 6' (182.9 cm) Weight: 201 lb 1.6 oz (91.218 kg) IBW/kg (Calculated) : 77.6 Adjusted Body Weight: 81kg  Vital Signs: Temp: 97.8 F (36.6 C) (03/13 0434) Temp src: Oral (03/13 0434) BP: 132/57 mmHg (03/13 0434) Pulse Rate: 59 (03/13 0434) Intake/Output from previous day: 03/12 0701 - 03/13 0700 In: 1115 [I.V.:1065; IV Piggyback:50] Out: 1800 [Urine:1800] Intake/Output from this shift:    Labs:  Recent Labs  12/05/13 0620 12/06/13 0548 12/07/13 0638  WBC 1.5* 2.7* 4.1  HGB 9.3* 9.9* 11.0*  PLT 26* 35* 45*  CREATININE 2.85*  --  2.42*   Estimated Creatinine Clearance: 27.2 ml/min (by C-G formula based on Cr of 2.42). No results found for this basename: VANCOTROUGH, [name], VANCORANDOM, GENTTROUGH, GENTPEAK, GENTRANDOM, TOBRATROUGH, TOBRAPEAK, TOBRARND, AMIKACINPEAK, AMIKACINTROU, AMIKACIN,  in the last 72 hours   Microbiology: Recent Results (from the past 720 hour(s))  CULTURE, BLOOD (ROUTINE X 2)     Status: None   Collection Time    11/27/13  1:10 AM      Result Value Ref Range Status   Specimen Description BLOOD RIGHT ANTECUBITAL   Final   Special Requests BOTTLES DRAWN AEROBIC AND ANAEROBIC [place]   Final   Culture NO GROWTH 5 DAYS   Final   Report Status 12/02/2013 FINAL   Final  CULTURE, BLOOD (ROUTINE X 2)     Status: None   Collection Time    11/27/13  1:10 AM      Result Value Ref Range Status   Specimen Description BLOOD RIGHT ANTECUBITAL   Final   Special Requests     Final   Value: BOTTLES DRAWN AEROBIC AND ANAEROBIC AEB=5CC ANA=7CC   Culture NO GROWTH 5 DAYS   Final   Report Status 12/02/2013 FINAL   Final  MRSA PCR SCREENING     Status: None   Collection Time    11/27/13  3:32 AM      Result  Value Ref Range Status   MRSA by PCR NEGATIVE  NEGATIVE Final   Comment:            The GeneXpert MRSA Assay (FDA     approved for NASAL specimens     only), is one component of a     comprehensive MRSA colonization     surveillance program. It is not     intended to diagnose MRSA     infection nor to guide or     monitor treatment for     MRSA infections.  URINE CULTURE     Status: None   Collection Time    11/27/13  5:23 AM      Result Value Ref Range Status   Specimen Description URINE, CLEAN CATCH   Final   Special Requests NONE   Final   Culture  Setup Time     Final   Value: 11/27/2013 10:30     Performed at [facility]     Final   Value: 20,OOO COLONIES/ML     Performed at [facility]   Culture     Final   Value: ENTEROCOCCUS SPECIES     Performed at [place]  Status 11/29/2013 FINAL   Final   Organism ID, Bacteria ENTEROCOCCUS SPECIES   Final   Medical History: Past Medical History  Diagnosis Date  . Essential hypertension, benign   . COPD (chronic obstructive pulmonary disease)   . Coronary atherosclerosis of native coronary artery     Multivessel status post CABG 1996  . Arthritis   . Atrial fibrillation     Coumadin stopped 12/2011 due to anemia  . Dysphagia   . Anemia     Requiring blood transfusions  . Carotid stenosis     Dr Scot Dock   . Diabetes mellitus, type 2   . Mixed hyperlipidemia     Statin intolerant  . Hematuria     Felt related to Foley insertion  . Cardiomyopathy     LVEF 45-50%  . Melena     EGD & Colonoscopy 09/2011 revealed gastritis, erosions, scars, diverticula, 8 colon polyps (largest 1.6cm, not removed 2/2 anticoagulation), small AVM in transverse colon  . History of tobacco abuse     Quit 1992  . Anxiety   . GERD (gastroesophageal reflux disease)   . Myocardial infarction   . Thrombocytopenia   . Myelodysplastic syndrome with 5 q minus 10/17/2012    On Revlimid 5 mg daily 21  days on and 7 days off  . Aortic stenosis     Mild  . CKD (chronic kidney disease) stage 3, GFR 30-59 ml/min    Medications:  Scheduled:  . amiodarone  200 mg Oral BID  . amitriptyline  30 mg Oral QHS  . antiseptic oral rinse  15 mL Mouth Rinse q12n4p  . aspirin EC  81 mg Oral BID  . azithromycin  500 mg Oral Daily  . ceFEPime (MAXIPIME) IV  2 g Intravenous Q24H  . fluconazole (DIFLUCAN) IV  200 mg Intravenous Q24H  . insulin aspart  0-15 Units Subcutaneous 6 times per day  . ipratropium-albuterol  3 mL Nebulization Q4H  . isosorbide mononitrate  30 mg Oral Daily  . metoprolol tartrate  25 mg Oral BID  . nystatin  5 mL Oral QID  . potassium chloride  20 mEq Oral Daily  . predniSONE  20 mg Oral Q breakfast  . pregabalin  75 mg Oral Daily  . tamsulosin  0.4 mg Oral QPC supper   Infusions:  . sodium chloride 100 mL/hr at 12/07/13 0430   Assessment: 80yo M who continues on empiric antibiotics for HCAP.    Hosital course complicated by acute pancytopenia, NSTEMI, and ARF.  Pt being followed by heme-onc and nephrology. Cx data remains negative except urine cx +enterococcus 20K colonies which has been adequately treated.  PNA clinically improved per MD.  Zithromax was added empirically for legionella coverage.  Pt is currently afebrile.  WBC improved. Heme-Onc has added Diflucan for oral thrush. Estimated Creatinine Clearance: 27.2 ml/min (by C-G formula based on Cr of 2.42).  Vancomycin 3/3>>3/8 Cefepime 3/3>> Zithromax 3/8>> Fluconazole 3/13 >>  Goal of Therapy:  Eradicate infection.  Plan:   Continue Cefepime 2gm IV q24h  Continue Zithromax 500mg  PO q24h  Monitor renal function and cx data   Duration of therapy per MD - Consider d/c antibiotics after pt completes 10 day course   Hart Robinsons A 12/07/2013,9:15 AM

## 2013-12-07 NOTE — Progress Notes (Signed)
Subjective: Patient seen resting in bed.   He reports a poor night with difficulty breathing requiring a "breathing treatment" around 315 AM.  He reports that his breathing is much improved now.  On questioning, he notes mouth soreness and on exam, he is noted to have oral candidiasis.  I will treat this aggressively.   I personally reviewed and went over laboratory results with the patient.  The results are noted within this dictation.  His Hgb is up to 11 g/dL.  His WBC is WNL.  His platelet count is low but stable.   He denies any bleeding.  Objective: Vital signs in last 24 hours: Temp:  [97.8 F (36.6 C)-98.1 F (36.7 C)] 97.8 F (36.6 C) (03/13 0434) Pulse Rate:  [59-64] 59 (03/13 0434) Resp:  [11-24] 13 (03/13 0434) BP: (132-153)/(57-68) 132/57 mmHg (03/13 0434) SpO2:  [92 %-96 %] 95 % (03/13 0750)  Intake/Output from previous day: 03/12 0800 - 03/13 0759 In: 1115 [I.V.:1065; IV Piggyback:50] Out: 1800 [Urine:1800] Intake/Output this shift:    General appearance: cooperative, appears stated age, no distress and nasal cannula in place Resp: clear to auscultation bilaterally Cardio: regular rate and rhythm GI: normal findings: bowel sounds normal and soft, non-tender Extremities: extremities normal, atraumatic, no cyanosis or edema Skin: No rash  GU:  Foley catheter in place with clear, yellow urine output Oropharynx: Oral candidiasis noted throughout oral cavity. Neuro: No focal deficits.  A+O x 3   Lab Results:   Recent Labs  12/06/13 0548 12/07/13 0638  WBC 2.7* 4.1  HGB 9.9* 11.0*  HCT 29.7* 33.6*  PLT 35* 45*   BMET  Recent Labs  12/05/13 0620 12/07/13 0638  NA 140 143  K 4.1 4.3  CL 106 109  CO2 24 23  GLUCOSE 180* 161*  BUN 76* 56*  CREATININE 2.85* 2.42*  CALCIUM 8.2* 8.3*    Studies/Results: Ct Biopsy  12/06/2013   CLINICAL DATA:  Pancytopenia  EXAM: CT GUIDED DEEP ILIAC BONE ASPIRATION AND CORE BIOPSY  TECHNIQUE: The procedure, risks  (including but not limited to bleeding, infection, organ damage ), benefits, and alternatives were explained to the patient. Questions regarding the procedure were encouraged and answered. The patient understands and consents to the procedure. Patient was placed supine on the CT gantry and limited axial scans through the pelvis were obtained. Appropriate skin entry site was identified. Skin site was marked, prepped with Betadine, draped in usual sterile fashion, and infiltrated locally with 1% lidocaine.  Intravenous Fentanyl and Versed were administered as conscious sedation during continuous cardiorespiratory monitoring by the radiology RN, with a total moderate sedation time of 10 minutes.  Under CT fluoroscopic guidance an 11-gauge Cook trocar bone needle was advanced into the right iliac bone just lateral to the sacroiliac joint. Once needle tip position was confirmed, coaxial core and aspiration samples were obtained. The final sample was obtained using the guiding needle itself, which was then removed. Post procedure scans show no hematoma or fracture. Patient tolerated procedure well, with no immediate complication.  IMPRESSION: 1. Technically successful CT guided right iliac bone core and aspiration biopsy.   Electronically Signed   By: Arne Cleveland M.D.   On: 12/06/2013 11:41    Medications: I have reviewed the patient's current medications.  Assessment/Plan: 1. Pancytopenia, stable and in some respects improved.  ?reactive to acute hospital issues (HCAP, NSTEMI) with a deficient bone marrow secondary from MDS versus MDS transformation to excess blastic stage or leukemia?  Repeat bone  marrow aspiration and biopsy (sent for cytogenetics) performed in interventional radiology on 12/06/2013. 2. 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 Became completely transfusion independent with last outpatient transfusion on 01/16/2013.   Repeat bone marrow aspiration and biopsy (sent for cytogenetics) performed in interventional radiology on 12/06/2013.    Myelodysplastic syndrome with 5 q minus   09/05/2012 Bone Marrow Biopsy 5 q- MDS   09/19/2012 - 12/15/2012 Chemotherapy Approximate start date of Revlimid 5 mg 21 days on and 7 days off   12/16/2012 - 07/18/2013 Chemotherapy Dose increased to 10 mg Revlimid 21 days on and 7 days off   07/19/2013 -  Chemotherapy Dose increased to 10 mg daily   3. Oral candidiasis, Diflucan 200 mg IV ordered every 24 hours and Nystatin QID. 4. Acute on chronic renal failure, followed by nephrology 5. NSTEMI, with elevated troponin. Followed by Cardiology  6. HCAP, on antibiotics. 7. Recommend PRBC transfusion to maintain a Hgb of 9 g/dL given patient's cardiac issues.  8. Recommend platelet transfusion for a platelet count of 10,000 or less and/or active bleeding.  9. HOLD REVLIMID as the medication can be prothrombogenic and myelosupresive. Given the patient's present situation, Revlimid is contraindicated.  Will restart at a lower dose as an outpatient when bone marrow testing is completed and transformation of 5q- MDS Syndrome is ruled out. 10. Will follow along as an inpatient 11. Patient will need an outpatient follow-up appointment at the [facility] in about 10 days.  Message sent to [facility] scheduler.  Patient and plan discussed with Dr. Farrel Gobble and he is in agreement with the aforementioned.      LOS: 10 days    KEFALAS,THOMAS 12/07/2013

## 2013-12-07 NOTE — Progress Notes (Signed)
Subjective: Interval History: . He had epesoide of difficulty this morning and improved with inhaler. His Appetite is poor but no nausea or vomiting. Still has some cough but no sputum production.  Objective: Vital signs in last 24 hours: Temp:  [97.8 F (36.6 C)-98.1 F (36.7 C)] 97.8 F (36.6 C) (03/13 0434) Pulse Rate:  [59-64] 59 (03/13 0434) Resp:  [11-24] 13 (03/13 0434) BP: (132-153)/(57-68) 132/57 mmHg (03/13 0434) SpO2:  [92 %-96 %] 95 % (03/13 0750) Weight change:   Intake/Output from previous day: 03/12 0701 - 03/13 0700 In: 1115 [I.V.:1065; IV Piggyback:50] Out: 1800 [Urine:1800] Intake/Output this shift:    General appearance: alert, cooperative and no distress Resp: diminished breath sounds posterior - bilateral and rhonchi bilaterally Cardio: regular rate and rhythm, S1, S2 normal, no murmur, click, rub or gallop GI: soft, non-tender; bowel sounds normal; no masses,  no organomegaly Extremities: edema Trace edema  Lab Results:  Recent Labs  12/06/13 0548 12/07/13 0638  WBC 2.7* 4.1  HGB 9.9* 11.0*  HCT 29.7* 33.6*  PLT 35* 45*   BMET:   Recent Labs  12/05/13 0620 12/07/13 0638  NA 140 143  K 4.1 4.3  CL 106 109  CO2 24 23  GLUCOSE 180* 161*  BUN 76* 56*  CREATININE 2.85* 2.42*  CALCIUM 8.2* 8.3*   No results found for this basename: PTH,  in the last 72 hours Iron Studies: No results found for this basename: IRON, TIBC, TRANSFERRIN, FERRITIN,  in the last 72 hours  Studies/Results: Ct Biopsy  12/06/2013   CLINICAL DATA:  Pancytopenia  EXAM: CT GUIDED DEEP ILIAC BONE ASPIRATION AND CORE BIOPSY  TECHNIQUE: The procedure, risks (including but not limited to bleeding, infection, organ damage ), benefits, and alternatives were explained to the patient. Questions regarding the procedure were encouraged and answered. The patient understands and consents to the procedure. Patient was placed supine on the CT gantry and limited axial scans through the  pelvis were obtained. Appropriate skin entry site was identified. Skin site was marked, prepped with Betadine, draped in usual sterile fashion, and infiltrated locally with 1% lidocaine.  Intravenous Fentanyl and Versed were administered as conscious sedation during continuous cardiorespiratory monitoring by the radiology RN, with a total moderate sedation time of 10 minutes.  Under CT fluoroscopic guidance an 11-gauge Cook trocar bone needle was advanced into the right iliac bone just lateral to the sacroiliac joint. Once needle tip position was confirmed, coaxial core and aspiration samples were obtained. The final sample was obtained using the guiding needle itself, which was then removed. Post procedure scans show no hematoma or fracture. Patient tolerated procedure well, with no immediate complication.  IMPRESSION: 1. Technically successful CT guided right iliac bone core and aspiration biopsy.   Electronically Signed   By: Arne Cleveland M.D.   On: 12/06/2013 11:41    I have reviewed the patient's current medications.  Assessment/Plan: Problem #1 acute kidney injury: His BUN and creatinine presently is improving. This could be from a cane/vancomycin-induced acute kidney injury. Patient none oliguric. Problem #2 pneumonia: Patient on antibiotics seems to be getting better. Problem #3 history of a trial fibrillation Problem #4 history of myelodysplastic syndrome. Problem #5 coronary artery disease Problem #6 hypokalemia: His potassium has corrected Problem #7 metabolic bone disease: His calcium and phosphorus is in range. Problem #8 Anemia her H/H is stable . Etiology not clear. Iron deficiency anemia[GI bleeding] Vs Chronic diseases[Mayelodysplatic syndrome] CKD?Marland Kitchen His H/H is improving Plan:We'll continue slow hydration  We'll check his tablet panel.    LOS: 10 days   Jimeka Balan S 12/07/2013,8:26 AM

## 2013-12-08 ENCOUNTER — Inpatient Hospital Stay
Admission: RE | Admit: 2013-12-08 | Discharge: 2014-01-02 | Disposition: A | Discharge status: Home / Self Care | Payer: BLUE CROSS/BLUE SHIELD | Source: Ambulatory Visit | Attending: Internal Medicine | Admitting: Internal Medicine

## 2013-12-08 LAB — BASIC METABOLIC PANEL
BUN: 56 mg/dL — ABNORMAL HIGH (ref 6–23)
CALCIUM: 8.1 mg/dL — AB (ref 8.4–10.5)
CHLORIDE: 107 meq/L (ref 96–112)
CO2: 23 mEq/L (ref 19–32)
Creatinine, Ser: 2.46 mg/dL — ABNORMAL HIGH (ref 0.50–1.35)
GFR calc Af Amer: 27 mL/min — ABNORMAL LOW (ref >90)
GFR calc non Af Amer: 23 mL/min — ABNORMAL LOW (ref >90)
Glucose, Bld: 169 mg/dL — ABNORMAL HIGH (ref 70–99)
Potassium: 4.2 mEq/L (ref 3.7–5.3)
SODIUM: 142 meq/L (ref 137–147)

## 2013-12-08 LAB — CBC WITH DIFFERENTIAL/PLATELET
BASOS ABS: 0.1 10*3/uL (ref 0.0–0.1)
Basophils Relative: 2 % — ABNORMAL HIGH (ref 0–1)
EOS PCT: 2 % (ref 0–5)
Eosinophils Absolute: 0.1 10*3/uL (ref 0.0–0.7)
HCT: 28.7 % — ABNORMAL LOW (ref 39.0–52.0)
Hemoglobin: 9.2 g/dL — ABNORMAL LOW (ref 13.0–17.0)
LYMPHS PCT: 11 % — AB (ref 12–46)
Lymphs Abs: 0.3 10*3/uL — ABNORMAL LOW (ref 0.7–4.0)
MCH: 29.7 pg (ref 26.0–34.0)
MCHC: 32.1 g/dL (ref 30.0–36.0)
MCV: 92.6 fL (ref 78.0–100.0)
Monocytes Absolute: 0.2 10*3/uL (ref 0.1–1.0)
Monocytes Relative: 7 % (ref 3–12)
NEUTROS ABS: 2.4 10*3/uL (ref 1.7–7.7)
Neutrophils Relative %: 77 % (ref 43–77)
PLATELETS: 47 10*3/uL — AB (ref 150–400)
RBC: 3.1 MIL/uL — ABNORMAL LOW (ref 4.22–5.81)
RDW: 16.7 % — AB (ref 11.5–15.5)
WBC: 3.1 10*3/uL — ABNORMAL LOW (ref 4.0–10.5)

## 2013-12-08 LAB — GLUCOSE, CAPILLARY
GLUCOSE-CAPILLARY: 188 mg/dL — AB (ref 70–99)
Glucose-Capillary: 144 mg/dL — ABNORMAL HIGH (ref 70–99)
Glucose-Capillary: 147 mg/dL — ABNORMAL HIGH (ref 70–99)
Glucose-Capillary: 153 mg/dL — ABNORMAL HIGH (ref 70–99)

## 2013-12-08 MED ORDER — BISACODYL 5 MG PO TBEC: 5.0000 mg | DELAYED_RELEASE_TABLET | Freq: Every day | ORAL | Status: DC | PRN

## 2013-12-08 MED ORDER — NYSTATIN 100000 UNIT/ML MT SUSP: 5.0000 mL | Freq: Four times a day (QID) | OROMUCOSAL | Status: DC

## 2013-12-08 MED ORDER — INSULIN ASPART 100 UNIT/ML ~~LOC~~ SOLN: 0.0000 [IU] | SUBCUTANEOUS | Status: DC

## 2013-12-08 MED ORDER — ISOSORBIDE MONONITRATE ER 30 MG PO TB24: 30.0000 mg | ORAL_TABLET | Freq: Every day | ORAL | Status: AC

## 2013-12-08 MED ORDER — CEFUROXIME AXETIL 500 MG PO TABS: 500.0000 mg | ORAL_TABLET | Freq: Two times a day (BID) | ORAL | Status: DC

## 2013-12-08 MED ORDER — IPRATROPIUM-ALBUTEROL 0.5-2.5 (3) MG/3ML IN SOLN: 3.0000 mL | Freq: Four times a day (QID) | RESPIRATORY_TRACT | Status: AC | PRN

## 2013-12-08 MED ORDER — POTASSIUM CHLORIDE CRYS ER 20 MEQ PO TBCR: 20.0000 meq | EXTENDED_RELEASE_TABLET | Freq: Every day | ORAL | Status: AC

## 2013-12-08 MED ORDER — POLYETHYLENE GLYCOL 3350 17 G PO PACK: 17.0000 g | PACK | Freq: Every day | ORAL | Status: DC | PRN

## 2013-12-08 MED ORDER — PREDNISONE 20 MG PO TABS: 20.0000 mg | ORAL_TABLET | Freq: Every day | ORAL | Status: DC

## 2013-12-08 NOTE — Progress Notes (Signed)
Subjective: Interval History: . Patient is feeling much better today. He denies any difficulty in breathing. [name] His Appetite is poor but no nausea or vomiting. Still has some cough but no sputum production.  Objective: Vital signs in last 24 hours: Temp:  [97 F (36.1 C)-98.6 F (37 C)] 97 F (36.1 C) (03/14 0518) Pulse Rate:  [60-65] 64 (03/14 0900) Resp:  [16] 16 (03/14 0518) BP: (116-189)/(52-71) 150/71 mmHg (03/14 0900) SpO2:  [90 %-98 %] 93 % (03/14 0715) Weight change:   Intake/Output from previous day: 03/13 0701 - 03/14 0700 In: 2435 [I.V.:2435] Out: 5050 [Urine:5050] Intake/Output this shift:    General appearance: alert, cooperative and no distress Resp: diminished breath sounds posterior - bilateral and rhonchi bilaterally Cardio: regular rate and rhythm, S1, S2 normal, no murmur, click, rub or gallop GI: soft, non-tender; bowel sounds normal; no masses,  no organomegaly Extremities: edema Trace edema  Lab Results:  Recent Labs  12/07/13 0638 12/08/13 0429  WBC 4.1 3.1*  HGB 11.0* 9.2*  HCT 33.6* 28.7*  PLT 45* 47*   BMET:   Recent Labs  12/07/13 0638 12/08/13 0429  NA 143 142  K 4.3 4.2  CL 109 107  CO2 23 23  GLUCOSE 161* 169*  BUN 56* 56*  CREATININE 2.42* 2.46*  CALCIUM 8.3* 8.1*   No results found for this basename: PTH,  in the last 72 hours Iron Studies:   Recent Labs  12/07/13 0638  IRON 35*  TIBC 140*  FERRITIN 2567*    Studies/Results: Ct Biopsy  12/06/2013   CLINICAL DATA:  Pancytopenia  EXAM: CT GUIDED DEEP ILIAC BONE ASPIRATION AND CORE BIOPSY  TECHNIQUE: The procedure, risks (including but not limited to bleeding, infection, organ damage ), benefits, and alternatives were explained to the patient. Questions regarding the procedure were encouraged and answered. The patient understands and consents to the procedure. Patient was placed supine on the CT gantry and limited axial scans through the pelvis were obtained. Appropriate  skin entry site was identified. Skin site was marked, prepped with Betadine, draped in usual sterile fashion, and infiltrated locally with 1% lidocaine.  Intravenous Fentanyl and Versed were administered as conscious sedation during continuous cardiorespiratory monitoring by the radiology RN, with a total moderate sedation time of 10 minutes.  Under CT fluoroscopic guidance an 11-gauge Cook trocar bone needle was advanced into the right iliac bone just lateral to the sacroiliac joint. Once needle tip position was confirmed, coaxial core and aspiration samples were obtained. The final sample was obtained using the guiding needle itself, which was then removed. Post procedure scans show no hematoma or fracture. Patient tolerated procedure well, with no immediate complication.  IMPRESSION: 1. Technically successful CT guided right iliac bone core and aspiration biopsy.   Electronically Signed   By: Arne Cleveland M.D.   On: 12/06/2013 11:41   Dg Chest Port 1 View  12/07/2013   CLINICAL DATA:  Shortness of breath.  EXAM: PORTABLE CHEST - 1 VIEW  COMPARISON:  12/01/2013  FINDINGS: Cardiomegaly with vascular congestion. Diffuse interstitial prominence throughout the lungs with perihilar and lower lobe opacities likely reflecting mild to moderate edema. Possible trace bilateral effusions.  Prior CABG.  No acute bony abnormality.  IMPRESSION: Findings compatible with mild to moderate CHF.   Electronically Signed   By: Rolm Baptise M.D.   On: 12/07/2013 09:21    I have reviewed the patient's current medications.  Assessment/Plan: Problem #1 acute kidney injury: His BUN and creatinine  slightly increasing possibly from fluid removal.. This could be from a cane/vancomycin-induced acute kidney injury. Patient none oliguric. Problem #2 Difficulty in breathing possibly a combination of CHF and pneumonia . Today he feels much better and had about 5050 of urine Problem #3 history of a trial fibrillation Problem #4  history of myelodysplastic syndrome. Problem #5 coronary artery disease Problem #6 hypokalemia: His potassium has corrected Problem #7 metabolic bone disease: His calcium and phosphorus is in range. Problem #8 Anemia her H/H is stable . Etiology not clear. Iron deficiency anemia[GI bleeding] Vs Chronic diseases[Mayelodysplatic syndrome] CKD?[name] His H/H is improving Plan:We'll continue slow hydration and decrease is ivf to 75 cc/min We'll check Metabolic  Panel in am   LOS: 11 days   Ho Parisi S 12/08/2013,10:40 AM

## 2013-12-08 NOTE — Progress Notes (Signed)
He looks much better. He says he feels better and is ready for discharge to the skilled care facility

## 2013-12-08 NOTE — Progress Notes (Signed)
IV removed. Report called to Miguel Rota at the [facility]. Patient ready for discharge.

## 2013-12-08 NOTE — Discharge Summary (Signed)
Physician Discharge Summary  Patient ID: [name] MRN: [id number] DOB/AGE: 06/15/1932 80 y.o. Primary Care Physician:Criselda Starke L, MD Admit date: 11/27/2013 Discharge date: 12/08/2013    Discharge Diagnoses:   Active Problems:   CAD   Atrial fibrillation   Anemia   Myelodysplastic syndrome with 5 q minus   Acute on chronic systolic heart failure   Other pancytopenia   HCAP (healthcare-associated pneumonia)   Community acquired pneumonia   NSTEMI (non-ST elevated myocardial infarction)     Medication List    STOP taking these medications       metFORMIN 500 MG tablet  Commonly known as:  GLUCOPHAGE      TAKE these medications       acetaminophen 500 MG tablet  Commonly known as:  TYLENOL  Take 1 tablet (500 mg total) by mouth every 6 (six) hours as needed. Pain     albuterol 108 (90 BASE) MCG/ACT inhaler  Commonly known as:  PROVENTIL HFA;VENTOLIN HFA  Inhale 2 puffs into the lungs every 6 (six) hours as needed for wheezing.     amiodarone 200 MG tablet  Commonly known as:  PACERONE  Take 0.5 tablets (100 mg total) by mouth daily.     amitriptyline 10 MG tablet  Commonly known as:  ELAVIL  Take 30 mg by mouth at bedtime.     amLODipine 5 MG tablet  Commonly known as:  NORVASC  TAKE ONE TABLET BY MOUTH EVERY DAY FOR BLOOD PRESSURE     aspirin EC 81 MG tablet  Take 81 mg by mouth 2 (two) times daily.     bisacodyl 5 MG EC tablet  Commonly known as:  DULCOLAX  Take 1 tablet (5 mg total) by mouth daily as needed for moderate constipation.     cefUROXime 500 MG tablet  Commonly known as:  CEFTIN  Take 1 tablet (500 mg total) by mouth 2 (two) times daily with a meal.     diazepam 5 MG tablet  Commonly known as:  VALIUM  Take 5 mg by mouth every 6 (six) hours as needed. Muscles Spasms     furosemide 40 MG tablet  Commonly known as:  LASIX  Take 1 tablet (40 mg total) by mouth daily. Take one half tablet daily on a routine basis. If your weight  goes up by 3 pounds or if you have edema of your legs take one full tablet twice a day for 3 days     HYDROcodone-acetaminophen 5-325 MG per tablet  Commonly known as:  NORCO/VICODIN  Take 1 tablet by mouth every 6 (six) hours as needed. Pain     insulin aspart 100 UNIT/ML injection  Commonly known as:  novoLOG  Inject 0-15 Units into the skin every 4 (four) hours.     ipratropium-albuterol 0.5-2.5 (3) MG/3ML Soln  Commonly known as:  DUONEB  Take 3 mLs by nebulization every 6 (six) hours as needed (shortness of breath).     isosorbide mononitrate 30 MG 24 hr tablet  Commonly known as:  IMDUR  Take 1 tablet (30 mg total) by mouth daily.     lenalidomide 10 MG capsule  Commonly known as:  REVLIMID  Take 1 capsule (10 mg total) by mouth daily.     metoprolol tartrate 25 MG tablet  Commonly known as:  LOPRESSOR  Take 25 mg by mouth 2 (two) times daily.     multivitamin tablet  Take 1 tablet by mouth daily.     nitroGLYCERIN  0.4 MG SL tablet  Commonly known as:  NITROSTAT  Place 1 tablet (0.4 mg total) under the tongue every 5 (five) minutes x 3 doses as needed for chest pain (up to 3 doses).     nystatin 100000 UNIT/ML suspension  Commonly known as:  MYCOSTATIN  Take 5 mLs (500,000 Units total) by mouth 4 (four) times daily.     polyethylene glycol packet  Commonly known as:  MIRALAX / GLYCOLAX  Take 17 g by mouth daily as needed for mild constipation or moderate constipation.     potassium chloride SA 20 MEQ tablet  Commonly known as:  K-DUR,KLOR-CON  Take 1 tablet (20 mEq total) by mouth daily.     predniSONE 20 MG tablet  Commonly known as:  DELTASONE  Take 1 tablet (20 mg total) by mouth daily with breakfast.     pregabalin 75 MG capsule  Commonly known as:  LYRICA  Take 75 mg by mouth daily.     RAPAFLO 8 MG Caps capsule  Generic drug:  silodosin  Take 8 mg by mouth daily with breakfast.     Red Yeast Rice 600 MG Tabs  Take 1 tablet by mouth daily.         Discharged Condition: Improved    Consults: Cardiology/nephrology  Significant Diagnostic Studies: Dg Chest 2 View  12/01/2013   CLINICAL DATA:  History of community acquired pneumonia, now with vomiting and weakness  EXAM: CHEST  2 VIEW  COMPARISON:  DG CHEST 1V PORT dated 11/27/2013  FINDINGS: The lungs are adequately inflated. There is density in the left lower lobe consistent with pneumonia. The cardiac silhouette is mildly enlarged. The pulmonary vascularity is prominent centrally, but no significant cephalization is demonstrated. The pulmonary interstitial markings are not abnormally increased. There are 8 intact sternal wires demonstrated. The patient has undergone previous CABG. The observed portions of the bony thorax appear normal.  IMPRESSION: 1. There is density in the left lower lobe consistent with pneumonia. This is slightly more conspicuous than on the earlier study. 2. The pulmonary interstitial markings have improved since the earlier study consistent with some resolution of CHF. Low-grade CHF likely remains.   Electronically Signed   By: David  Martinique   On: 12/01/2013 18:48   US Renal  12/03/2013   CLINICAL DATA:  Assess for possible obstruction, evaluate renal size.  EXAM: RENAL/URINARY TRACT ULTRASOUND COMPLETE  COMPARISON:  None.  FINDINGS: Right Kidney:  Length: 10.7 cm. The echotexture of the renal cortex is lower than that of the adjacent liver which is normal. There is an upper pole cyst measuring 2.6 cm in diameter.  Left Kidney:  Length: 10.6 cm. Echogenicity within normal limits. No mass or hydronephrosis visualized.  Bladder:  The prostate gland is enlarged and produces a moderate impression upon the urinary bladder base. No acute bladder abnormality is demonstrated.  IMPRESSION: 1. The kidneys demonstrate normal size and echotexture. There is no evidence of hydronephrosis. 2. There is a simple appearing cyst in the upper pole of the right kidney. 3. There is prostatic  enlargement with produces a prominent impression upon the urinary bladder base.   Electronically Signed   By: David  Martinique   On: 12/03/2013 09:26   Ct Biopsy  12/06/2013   CLINICAL DATA:  Pancytopenia  EXAM: CT GUIDED DEEP ILIAC BONE ASPIRATION AND CORE BIOPSY  TECHNIQUE: The procedure, risks (including but not limited to bleeding, infection, organ damage ), benefits, and alternatives were explained to the patient.  Questions regarding the procedure were encouraged and answered. The patient understands and consents to the procedure. Patient was placed supine on the CT gantry and limited axial scans through the pelvis were obtained. Appropriate skin entry site was identified. Skin site was marked, prepped with Betadine, draped in usual sterile fashion, and infiltrated locally with 1% lidocaine.  Intravenous Fentanyl and Versed were administered as conscious sedation during continuous cardiorespiratory monitoring by the radiology RN, with a total moderate sedation time of 10 minutes.  Under CT fluoroscopic guidance an 11-gauge Cook trocar bone needle was advanced into the right iliac bone just lateral to the sacroiliac joint. Once needle tip position was confirmed, coaxial core and aspiration samples were obtained. The final sample was obtained using the guiding needle itself, which was then removed. Post procedure scans show no hematoma or fracture. Patient tolerated procedure well, with no immediate complication.  IMPRESSION: 1. Technically successful CT guided right iliac bone core and aspiration biopsy.   Electronically Signed   By: Arne Cleveland M.D.   On: 12/06/2013 11:41   Dg Chest Port 1 View  12/07/2013   CLINICAL DATA:  Shortness of breath.  EXAM: PORTABLE CHEST - 1 VIEW  COMPARISON:  12/01/2013  FINDINGS: Cardiomegaly with vascular congestion. Diffuse interstitial prominence throughout the lungs with perihilar and lower lobe opacities likely reflecting mild to moderate edema. Possible trace  bilateral effusions.  Prior CABG.  No acute bony abnormality.  IMPRESSION: Findings compatible with mild to moderate CHF.   Electronically Signed   By: Rolm Baptise M.D.   On: 12/07/2013 09:21   Dg Chest Portable 1 View  11/27/2013   CLINICAL DATA:  Shortness of breath  EXAM: PORTABLE CHEST - 1 VIEW  COMPARISON:  January 05, 2013  FINDINGS: Diffuse interstitial coarsening, mildly asymmetric to the right. Stable heart size and mediastinal contours. Changes of previous CABG. No significant effusion. No pneumothorax.  IMPRESSION: 1. Mild pulmonary edema. 2. Asymmetric opacification at the right base. Please ensure no pulmonary infectious symptoms to suggest pneumonia.   Electronically Signed   By: Jorje Guild M.D.   On: 11/27/2013 02:02    Lab Results: Basic Metabolic Panel:  Recent Labs  12/07/13 0638 12/08/13 0429  NA 143 142  K 4.3 4.2  CL 109 107  CO2 23 23  GLUCOSE 161* 169*  BUN 56* 56*  CREATININE 2.42* 2.46*  CALCIUM 8.3* 8.1*   Liver Function Tests: No results found for this basename: AST, ALT, ALKPHOS, BILITOT, PROT, ALBUMIN,  in the last 72 hours   CBC:  Recent Labs  12/07/13 0638 12/08/13 0429  WBC 4.1 3.1*  NEUTROABS 2.9 2.4  HGB 11.0* 9.2*  HCT 33.6* 28.7*  MCV 91.6 92.6  PLT 45* 47*    No results found for this or any previous visit (from the past 240 hour(s)).   Hospital Course: This is a 80 year old who developed acute shortness of breath and came to the emergency department because of that. When he was seen in the emergency department he was seen to have pneumonia and was started on antibiotics. He required BiPAP to maintain his oxygenation. He continued short of breath and then started having increasing problems with chest discomfort and was found to have what appeared to be a non-STEMI. He had acute on chronic congestive heart failure which was treated. However with diuresis he developed increasing problems with his renal function. Diuresis was discontinued  and his renal function has slowly improved but has not come back to  baseline. He had an episode of paroxysmal nocturnal dyspnea just before discharge but only required one dose of Lasix. He had a bone marrow biopsy while he was in the hospital to check on his 5Q minus disease. He was found to be very weak and he is going to be transferred to skilled care facility for rehabilitation. His pneumonia has improved. He does not have any overt heart failure now. He is not having any chest pain. He has pancytopenia which is stable  Discharge Exam: Blood pressure 150/71, pulse 64, temperature 97 F (36.1 C), temperature source Oral, resp. rate 16, height 6' (1.829 m), weight 91.218 kg (201 lb 1.6 oz), SpO2 93.00%. He is awake and alert. He is very weak. His chest shows some rhonchi. His heart is irregular.  Disposition: To skilled care facility for rehabilitation. He will be on a no added salt diet on medications as above. He will take Ceftin for 5 days. He will be basic metabolic profile and CBC on 12/10/2013. He will be on oxygen at 2 L      Discharge Orders   Future Appointments Provider Department Dept Phone   12/14/2013 4:00 PM [place] Provider [place] [phone]   06/12/2014 3:00 PM [facility] [place] [facility] [address] [phone]   06/12/2014 4:00 PM Angelia Mould, MD [facility] [phone]   Future Orders Complete By Expires   Type and screen  11/29/2013 11/29/2014   Discharge to SNF when bed available  As directed       Follow-up Information   Follow up with [place].   Contact information:   [address] [place] [address] [phone]       Schedule an appointment as soon as possible for a visit with [facility]. (Appt in 10 days to see MD to follow-up on bone marrow test and to discuss treatment options)    Contact information:   [address] [place] [phone]       Signed: [name]   12/08/2013, 11:14 AM

## 2013-12-10 ENCOUNTER — Non-Acute Institutional Stay (SKILLED_NURSING_FACILITY): Payer: Medicare Other | Admitting: Internal Medicine

## 2013-12-10 ENCOUNTER — Other Ambulatory Visit: Payer: Self-pay | Admitting: *Deleted

## 2013-12-10 DIAGNOSIS — I509 Heart failure, unspecified: Secondary | ICD-10-CM

## 2013-12-10 DIAGNOSIS — J189 Pneumonia, unspecified organism: Secondary | ICD-10-CM

## 2013-12-10 DIAGNOSIS — I5041 Acute combined systolic (congestive) and diastolic (congestive) heart failure: Secondary | ICD-10-CM

## 2013-12-10 DIAGNOSIS — N189 Chronic kidney disease, unspecified: Secondary | ICD-10-CM

## 2013-12-10 DIAGNOSIS — N179 Acute kidney failure, unspecified: Secondary | ICD-10-CM

## 2013-12-10 LAB — GLUCOSE, CAPILLARY
GLUCOSE-CAPILLARY: 293 mg/dL — AB (ref 70–99)
GLUCOSE-CAPILLARY: 249 mg/dL — AB (ref 70–99)

## 2013-12-10 MED ORDER — DIAZEPAM 5 MG PO TABS: ORAL_TABLET | ORAL | Status: AC

## 2013-12-10 NOTE — Telephone Encounter (Signed)
[facility] 

## 2013-12-11 LAB — GLUCOSE, CAPILLARY
GLUCOSE-CAPILLARY: 173 mg/dL — AB (ref 70–99)
GLUCOSE-CAPILLARY: 195 mg/dL — AB (ref 70–99)
GLUCOSE-CAPILLARY: 180 mg/dL — AB (ref 70–99)
GLUCOSE-CAPILLARY: 289 mg/dL — AB (ref 70–99)
Glucose-Capillary: 261 mg/dL — ABNORMAL HIGH (ref 70–99)
Glucose-Capillary: 219 mg/dL — ABNORMAL HIGH (ref 70–99)
Glucose-Capillary: 272 mg/dL — ABNORMAL HIGH (ref 70–99)
Glucose-Capillary: 143 mg/dL — ABNORMAL HIGH (ref 70–99)
Glucose-Capillary: 292 mg/dL — ABNORMAL HIGH (ref 70–99)
Glucose-Capillary: 258 mg/dL — ABNORMAL HIGH (ref 70–99)

## 2013-12-12 ENCOUNTER — Non-Acute Institutional Stay (SKILLED_NURSING_FACILITY): Payer: Medicare Other | Admitting: Internal Medicine

## 2013-12-12 DIAGNOSIS — N189 Chronic kidney disease, unspecified: Secondary | ICD-10-CM

## 2013-12-12 DIAGNOSIS — I4891 Unspecified atrial fibrillation: Secondary | ICD-10-CM

## 2013-12-12 DIAGNOSIS — D469 Myelodysplastic syndrome, unspecified: Secondary | ICD-10-CM

## 2013-12-12 LAB — GLUCOSE, CAPILLARY
GLUCOSE-CAPILLARY: 232 mg/dL — AB (ref 70–99)
Glucose-Capillary: 141 mg/dL — ABNORMAL HIGH (ref 70–99)
Glucose-Capillary: 337 mg/dL — ABNORMAL HIGH (ref 70–99)
Glucose-Capillary: 304 mg/dL — ABNORMAL HIGH (ref 70–99)

## 2013-12-12 NOTE — Progress Notes (Signed)
Patient ID: [name], male   DOB: Jul 19, 1932, 80 y.o.   MRN: [id number]                  HISTORY & PHYSICAL  DATE:  12/10/2013      FACILITY: [facility]       LEVEL OF CARE:   SNF   CHIEF COMPLAINT:  Admission to SNF, post stay at [facility], 11/27/2013 through 12/08/2013.    HISTORY OF PRESENT ILLNESS:  This is a 80 year-old man who developed acute shortness of breath and came to the emergency department.  He was felt to have pneumonia, was started on antibiotics.  He required BiPAP.    He was found to have a non-ST elevation MI as well as acute-on-chronic heart failure, which was treated.  However, with diuresis, he developed increasing problems with his renal functions.  Diuretics were discontinued and his renal functions have slowly improved, but has not come back to baseline.    He had a bone marrow while he was in the hospital to check on the status of his myelodysplastic syndrome (5Q minus disease).    He has required admission to SNF for rehabilitation for profound lower extremity weakness and immobility.    LABORATORY DATA:  His discharge lab work on 12/08/2013 showed a BUN of 15, a creatinine of 2.46, potassium of 4.2.    White count was 3.1, hemoglobin of 9.2, and a platelet count of 47,000.   Differential count was normal.  Looking back through [facility], on 10/04/2013, his BUN was 17, creatinine 1.43.    His pro-BNP on arrival to hospital on 11/27/2013 was 1,952.  BUN was 16, creatinine of 1.79.    His troponin-I peaked at 15.61.  There was hypokinesis of the basal inferior myocardium consistent with abnormal relaxation (grade 2 diastolic dysfunction).   The aortic valve area was 1.02 cm^2.  He was also noted to have mild tricuspid regurgitation with a moderately elevated pulmonary systolic pressure of 48.    PAST MEDICAL HISTORY/PROBLEM LIST:    Coronary artery disease, status post CABG in the 1990s.    Atrial fibrillation.     Myelodysplastic syndrome with 5Q minus.    Healthcare-acquired pneumonia.    Recent non-ST elevation MI (see discussion above).    The patient says he is a borderline diabetic.    History of hyperlipidemia.    Osteoarthritis.    Coronary artery stenosis.    AVMs of the colon.    GI bleeding.    History of colonic polyps.    CURRENT MEDICATIONS:  Medication list is reviewed.    Amiodarone 200 mg a day.    Amitriptyline 30 mg at bedtime.    Amlodipine 5 mg at bedtime.    ASA 81 twice a day.    Ceftin 500 mg twice a day.    Lasix 40 mg once a day.    Hydrocodone/acetaminophen 5/325, 1 tablet p.o. q.6 p.r.n.    DuoNebs q.6 p.r.n.    Imdur 30 mg daily.    Lyrica 75 once a day.    Metoprolol 25 twice a day.    MiraLAX 17 g once a day.    Nitrostat p.r.n.    NovoLog sliding scale insulin.    Nystatin 100,000 U four times a day for 7 days.    Prednisone 20 mg once a day.    Potassium 20 mEq a day.    Proventil 2 puffs every 6 hours p.r.n.  Rapaflo 8 mg, 1 tablet daily.    SOCIAL HISTORY:   HOUSING:  The patient tells me he lives with his wife.   FUNCTIONAL STATUS:  He was functionally independent prior to admission.  Does not use a cane or a walker, per the patient.   TOBACCO USE:  He is an ex-smoker, quitting 17 years ago.    REVIEW OF SYSTEMS:   CHEST/RESPIRATORY:  The patient describes exertional shortness of breath.   CARDIAC:   No complaints of chest pain or shortness of breath.  He does not describe any chest pressure or heart problems prior to this admission.    GI:  No nausea or vomiting.  He does not describe dysphagia.  No abdominal pain.   GU:  No dysuria.   MUSCULOSKELETAL:  States he has not walked in two weeks.    PHYSICAL EXAMINATION:   VITAL SIGNS:   BLOOD PRESSURE:  128/58.   RESPIRATIONS:  20.   PULSE:  58.   TEMPERATURE:  97.4.   WEIGHT:  He arrived with a weight of 195.4 pounds.   HEENT:   MOUTH/THROAT:   He appears to  have a dry, sore tongue.   CHEST/RESPIRATORY:  There are inspiratory crackles over both lower lobes.  There is no wheezing.  Air entry is fairly good.   CARDIOVASCULAR:  CARDIAC:   Heart sounds are soft.  There are no gallops.  There is a 1/6 systolic ejection murmur at the aortic area.  JVP is borderline elevated at 45 degrees.   GASTROINTESTINAL:  ABDOMEN:   Distended.   LIVER/SPLEEN/KIDNEYS:  No liver, no spleen.  No obvious ascites.   GENITOURINARY:  BLADDER:   No suprapubic or costovertebral angle tenderness.   SKIN:  INSPECTION:  Multiple ecchymoses.   NEUROLOGICAL:    SENSATION/STRENGTH:  He has antigravity strength.   DEEP TENDON REFLEXES:  He is diffusely hyporeflexic.   BALANCE/GAIT:  I did not attempt to stand him up.  He had difficulty rolling over in bed.  I suspect this man is very weak and will have some difficulty ambulating.    ASSESSMENT/PLAN:  Congestive heart failure.  He has had a recent non-ST elevation MI.  He is on Lasix 40 mg.  I have reviewed his chest x-rays.  Indeed, this did look like CHF on the admission with vascular redistribution, Kerley B lines.  He still has crackles.  I am going to repeat his pro-BNP level along with his basic blood chemistries.    Acute-on-chronic renal failure.  His baseline creatinines are noted above.    Pulmonary hypertension.  He is on oxygen.  He was not on oxygen prior to admission.    Borderline diabetes.  His blood sugars yesterday were 173, 219, 272.  I think this man is frankly diabetic and I wonder whether he has had this for quite some time.  He will probably need a hemoglobin A1c.    History of atrial fibrillation.   He is on amiodarone.     Community-acquired pneumonia on admission.  This appears to have resolved.    We have had lab work back.  White count is 3.6, hemoglobin 9.6, platelets 43,000.  This appears to be his baseline.  Sodium was 140, potassium 4.3, CO2 23, BUN of 54, creatinine of 2.34.  It is better than  when he left the hospital.  However, he may need more diuretic.  I am going to wait for follow-up labs including a BNP and follow-up exam before  I increase his Lasix any further.    CPT CODE: [id number]

## 2013-12-13 ENCOUNTER — Other Ambulatory Visit (HOSPITAL_COMMUNITY): Payer: Self-pay | Admitting: Oncology

## 2013-12-13 DIAGNOSIS — D46C Myelodysplastic syndrome with isolated del(5q) chromosomal abnormality: Secondary | ICD-10-CM

## 2013-12-13 LAB — GLUCOSE, CAPILLARY
GLUCOSE-CAPILLARY: 331 mg/dL — AB (ref 70–99)
Glucose-Capillary: 193 mg/dL — ABNORMAL HIGH (ref 70–99)
Glucose-Capillary: 259 mg/dL — ABNORMAL HIGH (ref 70–99)
Glucose-Capillary: 392 mg/dL — ABNORMAL HIGH (ref 70–99)

## 2013-12-13 LAB — TISSUE HYBRIDIZATION (BONE MARROW)-NCBH

## 2013-12-13 LAB — CHROMOSOME ANALYSIS, BONE MARROW

## 2013-12-13 MED ORDER — LENALIDOMIDE 5 MG PO CAPS
5.0000 mg | ORAL_CAPSULE | Freq: Every day | ORAL | Status: DC
Start: 2013-12-13 — End: 2013-12-14

## 2013-12-14 ENCOUNTER — Encounter (HOSPITAL_COMMUNITY): Payer: Self-pay

## 2013-12-14 ENCOUNTER — Ambulatory Visit (HOSPITAL_COMMUNITY): Payer: Medicare Other

## 2013-12-14 ENCOUNTER — Encounter (HOSPITAL_COMMUNITY): Payer: Medicare Other | Attending: Oncology

## 2013-12-14 ENCOUNTER — Other Ambulatory Visit (HOSPITAL_COMMUNITY): Payer: Self-pay | Admitting: Oncology

## 2013-12-14 VITALS — BP 117/63 | HR 70 | Temp 97.4°F | Resp 20

## 2013-12-14 DIAGNOSIS — D46C Myelodysplastic syndrome with isolated del(5q) chromosomal abnormality: Secondary | ICD-10-CM

## 2013-12-14 DIAGNOSIS — N183 Chronic kidney disease, stage 3 unspecified: Secondary | ICD-10-CM | POA: Insufficient documentation

## 2013-12-14 DIAGNOSIS — D61818 Other pancytopenia: Secondary | ICD-10-CM

## 2013-12-14 DIAGNOSIS — N1832 Chronic kidney disease, stage 3b: Secondary | ICD-10-CM | POA: Insufficient documentation

## 2013-12-14 LAB — CBC WITH DIFFERENTIAL/PLATELET
BASOS ABS: 0.1 10*3/uL (ref 0.0–0.1)
Basophils Relative: 2 % — ABNORMAL HIGH (ref 0–1)
EOS ABS: 0.3 10*3/uL (ref 0.0–0.7)
Eosinophils Relative: 5 % (ref 0–5)
HCT: 31.8 % — ABNORMAL LOW (ref 39.0–52.0)
Hemoglobin: 10.5 g/dL — ABNORMAL LOW (ref 13.0–17.0)
LYMPHS PCT: 22 % (ref 12–46)
Lymphs Abs: 1.1 10*3/uL (ref 0.7–4.0)
MCH: 30.3 pg (ref 26.0–34.0)
MCHC: 33 g/dL (ref 30.0–36.0)
MCV: 91.6 fL (ref 78.0–100.0)
MONOS PCT: 1 % — AB (ref 3–12)
Monocytes Absolute: 0.1 10*3/uL (ref 0.1–1.0)
NEUTROS PCT: 70 % (ref 43–77)
Neutro Abs: 3.5 10*3/uL (ref 1.7–7.7)
Platelets: 49 10*3/uL — ABNORMAL LOW (ref 150–400)
RBC: 3.47 MIL/uL — ABNORMAL LOW (ref 4.22–5.81)
RDW: 16.8 % — AB (ref 11.5–15.5)
Smear Review: DECREASED
WBC: 5.1 10*3/uL (ref 4.0–10.5)

## 2013-12-14 LAB — GLUCOSE, CAPILLARY
GLUCOSE-CAPILLARY: 268 mg/dL — AB (ref 70–99)
Glucose-Capillary: 193 mg/dL — ABNORMAL HIGH (ref 70–99)

## 2013-12-14 MED ORDER — LENALIDOMIDE 5 MG PO CAPS: 5.0000 mg | ORAL_CAPSULE | Freq: Every day | ORAL | Status: DC

## 2013-12-14 NOTE — Patient Instructions (Signed)
[facility] Discharge Instructions  RECOMMENDATIONS MADE BY THE CONSULTANT AND ANY TEST RESULTS WILL BE SENT TO YOUR REFERRING PHYSICIAN.   Continue coronary rehabilitation.   Revlimid 5 mg daily continuously was ordered but should not be given until blood counts improved.   Followup in 2 weeks with CBC. Patient's  Grand-daughter and wife were present during this interview and expressed an understanding of this strategy. It was emphasized that should he receive additional Revlimid at home, none he be given to the patient until consulted here.      Thank you for choosing [facility] to provide your oncology and hematology care.  To afford each patient quality time with our providers, please arrive at least 15 minutes before your scheduled appointment time.  With your help, our goal is to use those 15 minutes to complete the necessary work-up to ensure our physicians have the information they need to help with your evaluation and healthcare recommendations.    Effective January 1st, 2014, we ask that you re-schedule your appointment with our physicians should you arrive 10 or more minutes late for your appointment.  We strive to give you quality time with our providers, and arriving late affects you and other patients whose appointments are after yours.    Again, thank you for choosing [facility].  Our hope is that these requests will decrease the amount of time that you wait before being seen by our physicians.       _____________________________________________________________  Should you have questions after your visit to [facility], please contact our office at [phone] between the hours of 8:30 a.m. and 5:00 p.m.  Voicemails left after 4:30 p.m. will not be returned until the following business day.  For prescription refill requests, have your pharmacy contact our office with your prescription refill request.

## 2013-12-14 NOTE — Progress Notes (Signed)
[name]  OFFICE PROGRESS NOTE  Caleb Bogus, MD [address] [place] [address]  DIAGNOSIS: Myelodysplastic syndrome with 5 q minus - Plan: CBC with Differential  Pancytopenia  Chronic kidney disease (CKD) stage G3b/A2, moderately decreased glomerular filtration rate (GFR) between 30-44 mL/min/1.73 square meter and albuminuria creatinine ratio between 30-299 mg/g - Plan: Urinalysis, Routine w reflex microscopic  Chief Complaint  Patient presents with  . Myelodysplastic syndrome 5q-    CURRENT THERAPY: Revlimid 10 mg daily discontinued during recent hospitalization on 11/27/2013 when acute coronary insufficiency was diagnosed. Non STEMI was diagnosed. Because of persistentl and much lower pancytopenia, the patient underwent bone marrow aspiration and biopsy on 12/06/2013 at which time 5q- persisted in the bone marrow with no increase in blasts or any evidence of acute leukemia. His GFR had decreased down to 22 mL per minute from 55 mL per min in November of 2003. On 12/08/2013 WBC was 3.1 hemoglobin 9.2 and platelets of 47,000.  INTERVAL HISTORY: [name] 80 y.o. male returns for followup after recent hospitalization for non--STEMI with pancytopenia. Revlimid was held and bone marrow aspiration and biopsy done to rule out progressive myelodysplasia or transformation into MDS with excessive blasts or acute leukemia. [place]ia bone marrow was consistent with persistence of 5q- cytogenetics with no evidence of acute leukemia or excessive blasts. After discharge from the hospitall he remains in rehabilitation. He denies chest pain, PND, orthopnea, epistaxis, melena, hematochezia, hematuria, but does bruise easily on both upper extremities. He denies any lower extremity swelling or redness, diarrhea, constipation, but does have residual peripheral paresthesias involving upper extremities but is able to write, eat and  manipulate buttons. He denies any fever, night sweats, or easy satiety. It is a denies any nausea, vomiting, cough, or severe shortness of breath.  MEDICAL HISTORY: Past Medical History  Diagnosis Date  . Essential hypertension, benign   . COPD (chronic obstructive pulmonary disease)   . Coronary atherosclerosis of native coronary artery     Multivessel status post CABG 1996  . Arthritis   . Atrial fibrillation     Coumadin stopped 12/2011 due to anemia  . Dysphagia   . Anemia     Requiring blood transfusions  . Carotid stenosis     Dr Scot Dock   . Diabetes mellitus, type 2   . Mixed hyperlipidemia     Statin intolerant  . Hematuria     Felt related to Foley insertion  . Cardiomyopathy     LVEF 45-50%  . Melena     EGD & Colonoscopy 09/2011 revealed gastritis, erosions, scars, diverticula, 8 colon polyps (largest 1.6cm, not removed 2/2 anticoagulation), small AVM in transverse colon  . History of tobacco abuse     Quit 1992  . Anxiety   . GERD (gastroesophageal reflux disease)   . Myocardial infarction   . Thrombocytopenia   . Myelodysplastic syndrome with 5 q minus 10/17/2012    On Revlimid 5 mg daily 21 days on and 7 days off  . Aortic stenosis     Mild  . CKD (chronic kidney disease) stage 3, GFR 30-59 ml/min     INTERIM HISTORY: has DYSLIPIDEMIA; CAD; Atrial fibrillation; PVD; OSTEOARTHRITIS; CAROTID ARTERY STENOSIS; Long term current use of anticoagulant; GI bleeding; AVM (arteriovenous malformation) of colon without hemorrhage; Angina effort; Anemia; History of colonic polyps; Myelodysplastic syndrome with 5 q minus; High output heart failure; Non-ST elevation MI (NSTEMI);  Acute on chronic systolic heart failure; Other pancytopenia; HCAP (healthcare-associated pneumonia); Community acquired pneumonia; NSTEMI (non-ST elevated myocardial infarction); and Chronic kidney disease (CKD) stage G3b/A2, moderately decreased glomerular filtration rate (GFR) between 30-44 mL/min/1.73  square meter and albuminuria creatinine ratio between 30-299 mg/g on his problem list.     Myelodysplastic syndrome with 5 q minus    09/05/2012  Bone Marrow Biopsy  5 q- MDS    09/19/2012 - 12/15/2012  Chemotherapy  Approximate start date of Revlimid 5 mg 21 days on and 7 days off    12/16/2012 - 07/18/2013  Chemotherapy  Dose increased to 10 mg Revlimid 21 days on and 7 days off    07/19/2013 -  Chemotherapy  Dose increased to 10 mg daily        11/27/2013               NonSTEMI                Revlimid d/c'd,  GFR 73ml/min  ALLERGIES:  is allergic to statins and tape.  MEDICATIONS: has a current medication list which includes the following prescription(s): acetaminophen, albuterol, amiodarone, amitriptyline, amlodipine, aspirin ec, bisacodyl, diazepam, furosemide, hydrocodone-acetaminophen, insulin aspart, ipratropium-albuterol, isosorbide mononitrate, metoprolol tartrate, multivitamin, nitroglycerin, nystatin, polyethylene glycol, potassium chloride sa, prednisone, pregabalin, red yeast rice, silodosin, cefuroxime, and lenalidomide.  SURGICAL HISTORY:  Past Surgical History  Procedure Laterality Date  . Knee surgery  1960    Right knee cartilage  . Rotator cuff repair  1990    right   . Inguinal hernia repair  2000 and 2010    left  . Cholecystectomy  05/2010    Lap chole with biologic mesh repair/reinforcement by  Dr Rise Patience  . Back surgery  02/2006    ruptured disk,  post op diskitis infection requiring prolonged hospital stay and  surgical I & D  . Tonsillectomy    . Esophagogastroduodenoscopy  10/08/2011    Procedure: ESOPHAGOGASTRODUODENOSCOPY (EGD);  Surgeon: [name], MD;  Location: [facility];  Service: Endoscopy;  Laterality: N/A;  . Colonoscopy  10/08/2011    Procedure: COLONOSCOPY;  Surgeon: [name], MD;  Location: [facility];  Service: Endoscopy;  Laterality: N/A;  . Cataract extraction, bilateral    . Peg placement  07/2006    placed due to prolonged  infection, poor po intake, malnutrition during several week hospital stay resulting from ruptured disc that became infected following surgery  . Colonoscopy  01/14/2012    Procedure: COLONOSCOPY;  Surgeon: [name], MD;  Location: [facility];  Service: Endoscopy;  Laterality: N/A;  945  . Coronary artery bypass graft  1996    CABG x 4  . Ventral hernia repair    . Colonoscopy  04/20/2012    Procedure: COLONOSCOPY;  Surgeon: [name], MD;  Location: [facility];  Service: Endoscopy;  Laterality: N/A;  1030  . Pr vein bypass graft,aorto-fem-pop  1996  . Spine surgery    . Joint replacement  1960    Right Knee  . Lymph node biopsy  08/15/2012    Procedure: LYMPH NODE BIOPSY;  Surgeon: Scherry Ran, MD;  Location: [facility];  Service: General;  Laterality: Left;  Cervical Lymph Node Bx in Minor Room  . Bone marrow biopsy    . Bone marrow aspiration      FAMILY HISTORY: family history includes Cancer in his father and sister; Coronary artery disease in his mother; Heart disease in  his brother and sister; Hyperlipidemia in his sister; Hypertension in his sister. There is no history of Anesthesia problems, Hypotension, Malignant hyperthermia, or Pseudochol deficiency.  SOCIAL HISTORY:  reports that he quit smoking about 23 years ago. His smoking use included Cigarettes. He has a 80 pack-year smoking history. He has never used smokeless tobacco. He reports that he does not drink alcohol or use illicit drugs.  REVIEW OF SYSTEMS:  Other than that discussed above is noncontributory.  PHYSICAL EXAMINATION: ECOG PERFORMANCE STATUS: 2 - Symptomatic, <50% confined to bed  Blood pressure 117/63, pulse 70, temperature 97.4 F (36.3 C), temperature source Oral, resp. rate 20, SpO2 98.00%.  GENERAL:alert, no distress and comfortable SKIN: skin color, texture, turgor are normal, no rashes or significant lesions EYES: PERLA; Conjunctiva are pink and non-injected, sclera clear SINUSES:  No redness or tenderness over maxillary or ethmoid sinuses OROPHARYNX:no exudate, no erythema on lips, buccal mucosa, or tongue. NECK: supple, thyroid normal size, non-tender, without nodularity. No masses CHEST: Midline sternotomy scar is well-healed. No gynecomastia. LYMPH:  no palpable lymphadenopathy in the cervical, axillary or inguinal LUNGS: clear to auscultation and percussion with normal breathing effort HEART: regular rate & rhythm and no murmurs. ABDOMEN:abdomen soft, non-tender and normal bowel sounds MUSCULOSKELETAL:no cyanosis of digits and no clubbing. Range of motion normal. Upper extremity ecchymoses. NEURO: alert & oriented x 3 with fluent speech, no focal motor/sensory deficits. Decreased hearing acuity.   LABORATORY DATA: Admission on 12/08/2013  Component Date Value Ref Range Status  . Glucose-Capillary 12/10/2013 293* 70 - 99 mg/dL Final  . Glucose-Capillary 12/10/2013 249* 70 - 99 mg/dL Final  . Glucose-Capillary 12/11/2013 180* 70 - 99 mg/dL Final  . Glucose-Capillary 12/09/2013 195* 70 - 99 mg/dL Final  . Comment 1 12/09/2013 [name]   Final  . Comment 2 12/09/2013 Orig Pt ID [id number]   Final  . Glucose-Capillary 12/09/2013 219* 70 - 99 mg/dL Final  . Comment 1 12/09/2013 [name],[name]   Final  . Comment 2 12/09/2013 Orig PT ID [id number]   Final  . Glucose-Capillary 12/09/2013 272* 70 - 99 mg/dL Final  . Comment 1 12/09/2013 [name],[name]   Final  . Comment 2 12/09/2013 Orig PT ID [id number]   Final  . Glucose-Capillary 12/09/2013 173* 70 - 99 mg/dL Final  . Comment 1 12/09/2013 [name],[name]   Final  . Comment 2 12/09/2013 Orig Pt ID [id number]   Final  . Glucose-Capillary 12/08/2013 261* 70 - 99 mg/dL Final  . Comment 1 12/08/2013 Orig Pt ID [id number]   Final  . Glucose-Capillary 12/11/2013 292* 70 - 99 mg/dL Final  . Glucose-Capillary 12/10/2013 143* 70 - 99 mg/dL Final  . Comment 1 12/10/2013 [name]   Final  . Comment 2 12/10/2013 Orig Pt Id  [id number]   Final  . Glucose-Capillary 12/11/2013 258* 70 - 99 mg/dL Final  . Glucose-Capillary 12/11/2013 289* 70 - 99 mg/dL Final  . Glucose-Capillary 12/12/2013 141* 70 - 99 mg/dL Final  . Glucose-Capillary 12/12/2013 232* 70 - 99 mg/dL Final  . Glucose-Capillary 12/12/2013 337* 70 - 99 mg/dL Final  . Glucose-Capillary 12/12/2013 304* 70 - 99 mg/dL Final  . Glucose-Capillary 12/13/2013 193* 70 - 99 mg/dL Final  . Glucose-Capillary 12/13/2013 259* 70 - 99 mg/dL Final  . Glucose-Capillary 12/13/2013 392* 70 - 99 mg/dL Final  . Glucose-Capillary 12/13/2013 331* 70 - 99 mg/dL Final  . Comment 1 12/13/2013 Documented in Chart   Final  . Comment 2 12/13/2013 Notify RN   Final  .  Glucose-Capillary 12/14/2013 268* 70 - 99 mg/dL Final  . Glucose-Capillary 12/14/2013 193* 70 - 99 mg/dL Final  Office Visit on 12/14/2013  Component Date Value Ref Range Status  . WBC 12/14/2013 5.1  4.0 - 10.5 K/uL Final  . RBC 12/14/2013 3.47* 4.22 - 5.81 MIL/uL Final  . Hemoglobin 12/14/2013 10.5* 13.0 - 17.0 g/dL Final  . HCT 12/14/2013 31.8* 39.0 - 52.0 % Final  . MCV 12/14/2013 91.6  78.0 - 100.0 fL Final  . MCH 12/14/2013 30.3  26.0 - 34.0 pg Final  . MCHC 12/14/2013 33.0  30.0 - 36.0 g/dL Final  . RDW 12/14/2013 16.8* 11.5 - 15.5 % Final  . Platelets 12/14/2013 49* 150 - 400 K/uL Final  . Neutrophils Relative % 12/14/2013 70  43 - 77 % Final  . Lymphocytes Relative 12/14/2013 22  12 - 46 % Final  . Monocytes Relative 12/14/2013 1* 3 - 12 % Final  . Eosinophils Relative 12/14/2013 5  0 - 5 % Final  . Basophils Relative 12/14/2013 2* 0 - 1 % Final  . Neutro Abs 12/14/2013 3.5  1.7 - 7.7 K/uL Final  . Lymphs Abs 12/14/2013 1.1  0.7 - 4.0 K/uL Final  . Monocytes Absolute 12/14/2013 0.1  0.1 - 1.0 K/uL Final  . Eosinophils Absolute 12/14/2013 0.3  0.0 - 0.7 K/uL Final  . Basophils Absolute 12/14/2013 0.1  0.0 - 0.1 K/uL Final  . Smear Review 12/14/2013 PLATELET CLUMPS NOTED ON SMEAR, COUNT APPEARS DECREASED    Final  Admission on 11/27/2013, Discharged on 12/08/2013  No results displayed because visit has over 200 results.    Hospital Outpatient Visit on 12/06/2013  Component Date Value Ref Range Status  . Bone Marrow Exam 12/06/2013 SEE PATHOLOGY REPORT [id number]   Final  . Chromosome-Routine 12/06/2013 SEE SEPARATE REPORT   Final   Performed at [facility]  . Tissue hybridization (bone marrow)* 12/06/2013 SEE SEPARATE REPORT   Final   Performed at [place]: FINAL for MUNEER, LEIDER ([id number]) Patient: [name], [name] Collected: 12/06/2013 Client: [facility] Accession: [id number] Received: 12/06/2013 D. Arne Cleveland DOB: [birth year] Age: 80 Gender: M Reported: 12-22-13 [address] Patient Ph: [phone] MRN #: [id number] [place], [place] [address] Visit #: [id number] Chart #: Phone: [phone] Fax: CC: Robynn Pane, PA-C BONE MARROW REPORT FINAL DIAGNOSIS Diagnosis Bone Marrow, Aspirate,Biopsy, and Clot, right iliac - HYPERCELLULAR BONE MARROW FOR AGE WITH TRILINEAGE HEMATOPOIESIS. - SEE COMMENT. PERIPHERAL BLOOD: - PANCYTOPENIA. Diagnosis Note Compared to previous bone marrow biopsy ([id number]), the dyspoietic changes are much less pronounced with only subtle changes and a background of trilineage hematopoiesis with progressive maturation. There is no increase in blastic cells as primarily seen by morphology and hence there is no evidence of acute leukemia. Correlation with cytogenetic studies is recommended. (BNS:caf 12/22/13) Susanne Greenhouse MD Pathologist, Electronic Signature (Case signed 12-22-13) GROSS AND MICROSCOPIC INFORMATION Specimen Clinical Information eval for transformation leukemia, thrombocytopenic (kp) Source Bone Marrow, Aspirate,Biopsy, and Clot, right iliac Microscopic LAB DATA: CBC performed on 12/06/2013 shows: WBC 2.7 K/ul Neutrophils 76% HB 9.9 g/dl Lymphocytes 16% HCT 29.7 % Monocytes  4% MCV 90.3 fL Eosinophils 2% RDW 17.2 % Basophils 2% 1 of 3 FINAL for [name], [name] ([id number]) Microscopic(continued) PLT 35 K/ul PERIPHERAL BLOOD SMEAR: The red blood cells display mild anisocytosis with macrocytic and normocytic cells. There is mild poikilocytosis with elliptocytes and tear drop cells. There is mild polychromasia. The white blood cells  are decreased in number. Many neutrophils display toxic granulation but with normal lobation for the most part. Significant neutrophilic left shift is not seen on scan. The platelets are decreased in number. BONE MARROW ASPIRATE: Erythroid precursors: Progressive maturation with lack of significant dyspoiesis. Granulocytic precursors: Progression maturation. Maturing cells display toxic granulation. No increase in blastic cells identified. Megakaryocytes: Abundant with predominantly normal morphology although occasional hypolobated forms are present. Lymphocytes/plasma cells: Large aggregates are not present. TOUCH PREPARATIONS: A mixture of cell types present. CLOT and BIOPSY: The sections show variable cellularity ranging from 30 to 70% with a mixture of myeloid cell types. Expansile sheets of blastic cells are not identified. IRON STAIN: Iron stains are performed on a bone marrow aspirate smear and section of clot. The controls stained appropriately. Storage Iron: Increased Ringed Sideroblasts: A few cells present ADDITIONAL DATA / TESTING: Specimen was sent for cytogenetic analysis and a separate report will follow. (BNS:caf 12/07/13) Specimen Table Bone Marrow count performed on 500 cells shows: Blasts: 0% Myeloid 54% Promyelocyts: 2% Myelocytes: 13% Erythroid 29% Metamyelocyts: 1% Bands: 10% Lymphocytes: 14% Neutrophils: 16% Eosinophils: 12% Plasma Cells: 2% Basophils: 0% Monocytes: 1% M:E ratio: 1.92 Gross Received in [facility] is a 1.5 x 1.4 x 0.1 cm aggregate of dark red soft tissue/material, submitted in block  A. Also received in [facility] in a separate container is a 2 x 0.2 cm core of tan-red firm tissue, which is submitted in block B following decalcification. (SSW:ecj 12/06/2013) Stain(s) Used in Diagnosis The following stain(s) were used in diagnosing the case: Iron Stain. The control(s) stained appropriately. 2 of 3 FINAL for ARVIN, [name] ([id number]) Report signed out from following location(s) Technical Component and Interpretation performed at [facility], [place], [place] [address]. \  5q- cytogenetics persists.  Urinalysis    Component Value Date/Time   COLORURINE YELLOW 11/27/2013 [place] 11/27/2013 0709   LABSPEC 1.025 11/27/2013 0709   PHURINE 5.5 11/27/2013 0709   GLUCOSEU >1000* 11/27/2013 0709   HGBUR SMALL* 11/27/2013 0709   BILIRUBINUR NEGATIVE 11/27/2013 0709   KETONESUR TRACE* 11/27/2013 0709   PROTEINUR 30* 11/27/2013 0709   UROBILINOGEN 0.2 11/27/2013 0709   NITRITE NEGATIVE 11/27/2013 0709   LEUKOCYTESUR NEGATIVE 11/27/2013 0709    RADIOGRAPHIC STUDIES: Dg Chest 2 View  12/01/2013   CLINICAL DATA:  History of community acquired pneumonia, now with vomiting and weakness  EXAM: CHEST  2 VIEW  COMPARISON:  DG CHEST 1V PORT dated 11/27/2013  FINDINGS: The lungs are adequately inflated. There is density in the left lower lobe consistent with pneumonia. The cardiac silhouette is mildly enlarged. The pulmonary vascularity is prominent centrally, but no significant cephalization is demonstrated. The pulmonary interstitial markings are not abnormally increased. There are 8 intact sternal wires demonstrated. The patient has undergone previous CABG. The observed portions of the bony thorax appear normal.  IMPRESSION: 1. There is density in the left lower lobe consistent with pneumonia. This is slightly more conspicuous than on the earlier study. 2. The pulmonary interstitial markings have improved since the earlier study consistent with some resolution  of CHF. Low-grade CHF likely remains.   Electronically Signed   By: David  Martinique   On: 12/01/2013 18:48   US Renal  12/03/2013   CLINICAL DATA:  Assess for possible obstruction, evaluate renal size.  EXAM: RENAL/URINARY TRACT ULTRASOUND COMPLETE  COMPARISON:  None.  FINDINGS: Right Kidney:  Length: 10.7 cm. The echotexture of the renal cortex is lower  than that of the adjacent liver which is normal. There is an upper pole cyst measuring 2.6 cm in diameter.  Left Kidney:  Length: 10.6 cm. Echogenicity within normal limits. No mass or hydronephrosis visualized.  Bladder:  The prostate gland is enlarged and produces a moderate impression upon the urinary bladder base. No acute bladder abnormality is demonstrated.  IMPRESSION: 1. The kidneys demonstrate normal size and echotexture. There is no evidence of hydronephrosis. 2. There is a simple appearing cyst in the upper pole of the right kidney. 3. There is prostatic enlargement with produces a prominent impression upon the urinary bladder base.   Electronically Signed   By: David  Martinique   On: 12/03/2013 09:26   Ct Biopsy  12/06/2013   CLINICAL DATA:  Pancytopenia  EXAM: CT GUIDED DEEP ILIAC BONE ASPIRATION AND CORE BIOPSY  TECHNIQUE: The procedure, risks (including but not limited to bleeding, infection, organ damage ), benefits, and alternatives were explained to the patient. Questions regarding the procedure were encouraged and answered. The patient understands and consents to the procedure. Patient was placed supine on the CT gantry and limited axial scans through the pelvis were obtained. Appropriate skin entry site was identified. Skin site was marked, prepped with Betadine, draped in usual sterile fashion, and infiltrated locally with 1% lidocaine.  Intravenous Fentanyl and Versed were administered as conscious sedation during continuous cardiorespiratory monitoring by the radiology RN, with a total moderate sedation time of 10 minutes.  Under CT  fluoroscopic guidance an 11-gauge Cook trocar bone needle was advanced into the right iliac bone just lateral to the sacroiliac joint. Once needle tip position was confirmed, coaxial core and aspiration samples were obtained. The final sample was obtained using the guiding needle itself, which was then removed. Post procedure scans show no hematoma or fracture. Patient tolerated procedure well, with no immediate complication.  IMPRESSION: 1. Technically successful CT guided right iliac bone core and aspiration biopsy.   Electronically Signed   By: Arne Cleveland M.D.   On: 12/06/2013 11:41   Dg Chest Port 1 View  12/07/2013   CLINICAL DATA:  Shortness of breath.  EXAM: PORTABLE CHEST - 1 VIEW  COMPARISON:  12/01/2013  FINDINGS: Cardiomegaly with vascular congestion. Diffuse interstitial prominence throughout the lungs with perihilar and lower lobe opacities likely reflecting mild to moderate edema. Possible trace bilateral effusions.  Prior CABG.  No acute bony abnormality.  IMPRESSION: Findings compatible with mild to moderate CHF.   Electronically Signed   By: Rolm Baptise M.D.   On: 12/07/2013 09:21   Dg Chest Portable 1 View  11/27/2013   CLINICAL DATA:  Shortness of breath  EXAM: PORTABLE CHEST - 1 VIEW  COMPARISON:  January 05, 2013  FINDINGS: Diffuse interstitial coarsening, mildly asymmetric to the right. Stable heart size and mediastinal contours. Changes of previous CABG. No significant effusion. No pneumothorax.  IMPRESSION: 1. Mild pulmonary edema. 2. Asymmetric opacification at the right base. Please ensure no pulmonary infectious symptoms to suggest pneumonia.   Electronically Signed   By: Jorje Guild M.D.   On: 11/27/2013 02:02    ASSESSMENT:  #1. Myelodysplastic syndrome,5q- persistence in the bone marrow but without evidence of progression to AML or excessive blasts. #2. Recent pancytopenia secondary to Revlimid toxicity. #3. Decrease in renal function down to 22 milliliters per minute  GFR from 55 mL per minute. #4. Coronary artery disease, status post nonSTEMI   PLAN:  #1. Continue coronary rehabilitation. #2. Revlimid 5 mg daily continuously  was ordered but should not be given until blood counts improved. #3. Followup in 2 weeks with CBC. Patient daughter and wife were present during this interview and expressed an understanding of this strategy. It was emphasized that should she receive additional Revlimid at home, none she be given to the patient until consulted here.   All questions were answered. The patient knows to call the clinic with any problems, questions or concerns. We can certainly see the patient much sooner if necessary.   I spent 25 minutes counseling the patient face to face. The total time spent in the appointment was 30 minutes.    Doroteo Bradford, MD 12/17/2013 7:40 AM

## 2013-12-17 ENCOUNTER — Non-Acute Institutional Stay (SKILLED_NURSING_FACILITY): Payer: Medicare Other | Admitting: Internal Medicine

## 2013-12-17 ENCOUNTER — Encounter: Payer: Self-pay | Admitting: Internal Medicine

## 2013-12-17 DIAGNOSIS — N189 Chronic kidney disease, unspecified: Secondary | ICD-10-CM

## 2013-12-17 DIAGNOSIS — D469 Myelodysplastic syndrome, unspecified: Secondary | ICD-10-CM

## 2013-12-17 DIAGNOSIS — I509 Heart failure, unspecified: Secondary | ICD-10-CM

## 2013-12-17 DIAGNOSIS — I5041 Acute combined systolic (congestive) and diastolic (congestive) heart failure: Secondary | ICD-10-CM

## 2013-12-17 LAB — GLUCOSE, CAPILLARY
GLUCOSE-CAPILLARY: 316 mg/dL — AB (ref 70–99)
GLUCOSE-CAPILLARY: 306 mg/dL — AB (ref 70–99)
GLUCOSE-CAPILLARY: 186 mg/dL — AB (ref 70–99)
GLUCOSE-CAPILLARY: 396 mg/dL — AB (ref 70–99)
GLUCOSE-CAPILLARY: 401 mg/dL — AB (ref 70–99)
GLUCOSE-CAPILLARY: 306 mg/dL — AB (ref 70–99)
GLUCOSE-CAPILLARY: 131 mg/dL — AB (ref 70–99)
Glucose-Capillary: 285 mg/dL — ABNORMAL HIGH (ref 70–99)
Glucose-Capillary: 138 mg/dL — ABNORMAL HIGH (ref 70–99)
Glucose-Capillary: 405 mg/dL — ABNORMAL HIGH (ref 70–99)
Glucose-Capillary: 155 mg/dL — ABNORMAL HIGH (ref 70–99)
Glucose-Capillary: 183 mg/dL — ABNORMAL HIGH (ref 70–99)
Glucose-Capillary: 294 mg/dL — ABNORMAL HIGH (ref 70–99)
Glucose-Capillary: 174 mg/dL — ABNORMAL HIGH (ref 70–99)
Glucose-Capillary: 340 mg/dL — ABNORMAL HIGH (ref 70–99)
Glucose-Capillary: 257 mg/dL — ABNORMAL HIGH (ref 70–99)

## 2013-12-17 NOTE — Progress Notes (Signed)
Patient ID: [name], male   DOB: [birth year], 80 y.o.   MRN: [id number] Facility:  This encounter was created in error - please disregard.

## 2013-12-18 ENCOUNTER — Other Ambulatory Visit: Payer: Self-pay | Admitting: *Deleted

## 2013-12-18 LAB — GLUCOSE, CAPILLARY
GLUCOSE-CAPILLARY: 294 mg/dL — AB (ref 70–99)
GLUCOSE-CAPILLARY: 252 mg/dL — AB (ref 70–99)
Glucose-Capillary: 128 mg/dL — ABNORMAL HIGH (ref 70–99)
Glucose-Capillary: 252 mg/dL — ABNORMAL HIGH (ref 70–99)

## 2013-12-18 MED ORDER — PREGABALIN 75 MG PO CAPS: 75.0000 mg | ORAL_CAPSULE | Freq: Every day | ORAL | Status: DC

## 2013-12-18 NOTE — Discharge Summary (Signed)
[name], [name]             ACCOUNT NO.:  [id number]  MEDICAL RECORD NO.:  [id number]  LOCATION:  A326                          FACILITY:  [facility]  PHYSICIAN:  Ely Ballen L. Luan Pulling, M.D.DATE OF BIRTH:  November 03, 1931  DATE OF ADMISSION:  11/27/2013 DATE OF DISCHARGE:  03/14/2015LH                              DISCHARGE SUMMARY   ADDENDUM  DISCHARGE DIAGNOSIS:  Sepsis.     [name] L. Luan Pulling, M.D.     ELH/MEDQ  D:  12/18/2013  T:  12/18/2013  Job:  [id number]

## 2013-12-18 NOTE — Progress Notes (Signed)
Patient ID: [name], male   DOB: 01-01-1932, 80 y.o.   MRN: [id number]                   PROGRESS NOTE  DATE:  12/12/2013    FACILITY: [facility]    LEVEL OF CARE:   SNF   Acute Visit   CHIEF COMPLAINT:  Follow-up of clinical status, post recent admission.    HISTORY OF PRESENT ILLNESS:  I saw [name] two days ago in order to admit him to SNF.  He had been admitted to hospital with coronary artery disease.  He was found to have a non-ST elevation MI.    He had congestive heart failure.  He was diuresed and had increasing problems with prerenal azotemia.  His discharge BUN and creatinine on 12/08/2013 were 56 and 2.46, respectively.  Lab work from today shows 50 and 2.55.    An echocardiogram on 11/28/2013 essentially showed a normal ejection fraction, diastolic relaxation issues.  He had known aortic stenosis with a valve area of 1.02 cm^2.  He had some degree of pulmonary hypertension with a peak pressure of 48 mmHg and moderate LVH, basal inferior hypokinesis.    REVIEW OF SYSTEMS:    CHEST/RESPIRATORY:  The patient has oxygen on.  Not complaining of shortness of breath.  He did not wear oxygen.   CARDIAC:   No complaints of chest pain.   GI:   No nausea,  vomiting or abdominal pain.    PHYSICAL EXAMINATION:   VITAL SIGNS:   O2 SATURATIONS:  98% on 2 L.   PULSE:  60 and fairly regular.   RESPIRATIONS:  18.   GENERAL APPEARANCE:  He is in no distress.   CHEST/RESPIRATORY:  Exam is clear.   CARDIOVASCULAR:  CARDIAC:   Heart sounds are normal.  There is a systolic ejection murmur.  JVP is not elevated.  He has no peripheral edema.   CIRCULATION:   EDEMA/VARICOSITIES:  Extremities:  No evidence of a DVT.    ASSESSMENT/PLAN:  Status post non-ST elevation MI.  He is on a beta blocker.  He is not a candidate for an ACE inhibitor due to his renal failure.  He is also on Rapaflo  He is on Lasix 20 mg a day.   His weight has gone from 195.4 on admission  down to 188.5 pounds.  This seems stable.    Chronic renal insufficiency.  This appears to be at its baseline.    Myelodysplastic syndrome with 5Q minus.  His hemoglobin is 9.1 and that appears to be stable.  He has Revlimid 10 mg daily, although he is  apparently to see his oncologist on Friday with regards to when to start this medication.    Atrial fibrillation.  He appears to be in regular rhythm currently.  His rate is controlled.    We can taper this man's oxygen.  I will follow up his lab work next week.  His BUN and creatinine appear to be stable.  Hemoglobin is stable.  There is no evidence of heart failure at the bedside.    CPT CODE: 59935

## 2013-12-18 NOTE — Telephone Encounter (Signed)
[facility] 

## 2013-12-19 LAB — GLUCOSE, CAPILLARY
GLUCOSE-CAPILLARY: 128 mg/dL — AB (ref 70–99)
GLUCOSE-CAPILLARY: 209 mg/dL — AB (ref 70–99)
Glucose-Capillary: 289 mg/dL — ABNORMAL HIGH (ref 70–99)
Glucose-Capillary: 258 mg/dL — ABNORMAL HIGH (ref 70–99)

## 2013-12-20 ENCOUNTER — Other Ambulatory Visit: Payer: Self-pay | Admitting: *Deleted

## 2013-12-20 LAB — GLUCOSE, CAPILLARY
Glucose-Capillary: 127 mg/dL — ABNORMAL HIGH (ref 70–99)
Glucose-Capillary: 278 mg/dL — ABNORMAL HIGH (ref 70–99)
Glucose-Capillary: 320 mg/dL — ABNORMAL HIGH (ref 70–99)

## 2013-12-20 MED ORDER — HYDROCODONE-ACETAMINOPHEN 5-325 MG PO TABS: ORAL_TABLET | ORAL | Status: AC

## 2013-12-20 NOTE — Telephone Encounter (Signed)
RX faxed to [facility] @ [phone]

## 2013-12-21 LAB — GLUCOSE, CAPILLARY
GLUCOSE-CAPILLARY: 134 mg/dL — AB (ref 70–99)
Glucose-Capillary: 366 mg/dL — ABNORMAL HIGH (ref 70–99)
Glucose-Capillary: 288 mg/dL — ABNORMAL HIGH (ref 70–99)
Glucose-Capillary: 204 mg/dL — ABNORMAL HIGH (ref 70–99)

## 2013-12-21 NOTE — Progress Notes (Signed)
Patient ID: [name], male   DOB: 06/22/1932, 80 y.o.   MRN: [id number]                   PROGRESS NOTE  DATE:  12/17/2013    FACILITY: [facility]    LEVEL OF CARE:   SNF   Acute Visit   CHIEF COMPLAINT:  Review of multiple medical issues  HISTORY OF PRESENT ILLNESS:  [name] is a gentleman who came to [place] midway through this month.  He was in hospital with pneumonia, requiring respiratory support from BiPAP.  He was ultimately found to have a non-ST elevation MI with acute-on-chronic CHF.    He also has other serious medical issues including severe chronic renal failure with a baseline creatinine of 2.46.    He also has myelodysplastic syndrome with 5Q minus genetics.  He follows with Hematology and has been seen.  He was on Revlimid.  However, this has been held due to pancytopenia.    Lab work from today shows a white count of 4.4, a hemoglobin of 8.8, platelet count of 42,000.  Differential is essentially normal.  These values are stable to unchanged.  He had a bone marrow done in hospital that showed his previous myelodysplastic changes.  He did not have any changes of leukemic transformation.    In terms of his general medical problems, his heart failure appears to be stable.    His weights are stable to decreased, being admitted with a weight of 190 pounds.  On 12/14/2013, his weight was 184 pounds.    His blood pressure has been stable, pulse rate and respirations stable.    Last BUN and creatinine were 51 and 2.28.  This is stable to improved for him.    REVIEW OF SYSTEMS:   CHEST/RESPIRATORY:   He is not complaining of cough or shortness of breath.   CARDIAC:   No chest pain.   GI:  No nausea, vomiting or diarrhea.    PHYSICAL EXAMINATION:   VITAL SIGNS:  PULSE:  69.   RESPIRATIONS:  20.   TEMPERATURE:  98.1.   BLOOD PRESSURE:  114/71.   WEIGHT:  Weight at 185.1 on 12/15/2013.    GENERAL APPEARANCE:  The patient looks to be in no  distress.   CHEST/RESPIRATORY:  Shallow air entry bilaterally.    CARDIOVASCULAR:  CARDIAC:   Heart sounds are distant.  There are no murmurs or gallops.  His JVP is not elevated.  He has no peripheral or coccyx edema.   GASTROINTESTINAL:  ABDOMEN:    Soft, nontender.  No masses.   LIVER/SPLEEN/KIDNEYS:  No liver, no spleen.    ASSESSMENT/PLAN:  Congestive heart failure.  This appears stable to improved.  No adjustment of his diuretics are necessary.  Currently on Lasix 40 mg once a day.    Stage IV chronic renal failure.  This appears to be stable, as well.    Coronary artery disease.  Status post non-ST elevation MI.  He is not describing chest pain.  Things appear to be stable.    I wonder about chronic lung disease/COPD.  He quit smoking 20 years ago.    Myelodysplastic syndrome.  He is off Revlimid.  Bone marrow biopsy in the hospital as noted above.  He follows with Hematology.  Next appointment is in a week to two.    Type 2 diabetes/steroid-induced diabetes.  The patient is on prednisone 20 mg a day.  With considerable effort,  I was able to verify that he was not on this prior to admission.  However, I am not exactly sure why this was started.  In any case, it is clearly aggravating his diabetes.  Yesterday's blood sugars were 155, 183, 294.  I am going to begin a taper of prednisone.  I thought I had ordered a hemoglobin A1c, although I do not see this.    I will begin trying to taper this man's oxygen.  From a cardiopulmonary point of view, he appears to be stable.  His hematology is being followed by Oncology and is stable, albeit with drug-induced presumably pancytopenia.    CPT CODE: 39688

## 2013-12-22 LAB — GLUCOSE, CAPILLARY
GLUCOSE-CAPILLARY: 283 mg/dL — AB (ref 70–99)
GLUCOSE-CAPILLARY: 219 mg/dL — AB (ref 70–99)
Glucose-Capillary: 116 mg/dL — ABNORMAL HIGH (ref 70–99)
Glucose-Capillary: 189 mg/dL — ABNORMAL HIGH (ref 70–99)

## 2013-12-23 LAB — GLUCOSE, CAPILLARY
GLUCOSE-CAPILLARY: 118 mg/dL — AB (ref 70–99)
GLUCOSE-CAPILLARY: 248 mg/dL — AB (ref 70–99)
Glucose-Capillary: 271 mg/dL — ABNORMAL HIGH (ref 70–99)
Glucose-Capillary: 285 mg/dL — ABNORMAL HIGH (ref 70–99)

## 2013-12-24 ENCOUNTER — Telehealth (HOSPITAL_COMMUNITY): Payer: Self-pay

## 2013-12-24 LAB — GLUCOSE, CAPILLARY
GLUCOSE-CAPILLARY: 136 mg/dL — AB (ref 70–99)
GLUCOSE-CAPILLARY: 360 mg/dL — AB (ref 70–99)
Glucose-Capillary: 216 mg/dL — ABNORMAL HIGH (ref 70–99)
Glucose-Capillary: 387 mg/dL — ABNORMAL HIGH (ref 70–99)

## 2013-12-24 NOTE — Telephone Encounter (Signed)
Granddaughter has brought in Revlimid and per Dr. Idamae Lusher instructions, patient is to take 5 mg daily for 21 days and off 7 days.  To keep scheduled appointment for labs and office visit on 01/01/14.

## 2013-12-25 LAB — GLUCOSE, CAPILLARY
GLUCOSE-CAPILLARY: 130 mg/dL — AB (ref 70–99)
GLUCOSE-CAPILLARY: 284 mg/dL — AB (ref 70–99)
Glucose-Capillary: 271 mg/dL — ABNORMAL HIGH (ref 70–99)
Glucose-Capillary: 258 mg/dL — ABNORMAL HIGH (ref 70–99)

## 2013-12-26 LAB — GLUCOSE, CAPILLARY
GLUCOSE-CAPILLARY: 251 mg/dL — AB (ref 70–99)
Glucose-Capillary: 110 mg/dL — ABNORMAL HIGH (ref 70–99)
Glucose-Capillary: 268 mg/dL — ABNORMAL HIGH (ref 70–99)
Glucose-Capillary: 383 mg/dL — ABNORMAL HIGH (ref 70–99)

## 2013-12-27 LAB — GLUCOSE, CAPILLARY
GLUCOSE-CAPILLARY: 142 mg/dL — AB (ref 70–99)
GLUCOSE-CAPILLARY: 203 mg/dL — AB (ref 70–99)
GLUCOSE-CAPILLARY: 273 mg/dL — AB (ref 70–99)
Glucose-Capillary: 235 mg/dL — ABNORMAL HIGH (ref 70–99)

## 2013-12-28 LAB — GLUCOSE, CAPILLARY
GLUCOSE-CAPILLARY: 162 mg/dL — AB (ref 70–99)
Glucose-Capillary: 160 mg/dL — ABNORMAL HIGH (ref 70–99)
Glucose-Capillary: 162 mg/dL — ABNORMAL HIGH (ref 70–99)

## 2013-12-29 LAB — GLUCOSE, CAPILLARY
GLUCOSE-CAPILLARY: 158 mg/dL — AB (ref 70–99)
Glucose-Capillary: 292 mg/dL — ABNORMAL HIGH (ref 70–99)
Glucose-Capillary: 332 mg/dL — ABNORMAL HIGH (ref 70–99)
Glucose-Capillary: 264 mg/dL — ABNORMAL HIGH (ref 70–99)

## 2013-12-30 LAB — GLUCOSE, CAPILLARY
GLUCOSE-CAPILLARY: 150 mg/dL — AB (ref 70–99)
Glucose-Capillary: 225 mg/dL — ABNORMAL HIGH (ref 70–99)
Glucose-Capillary: 207 mg/dL — ABNORMAL HIGH (ref 70–99)

## 2013-12-31 LAB — GLUCOSE, CAPILLARY
GLUCOSE-CAPILLARY: 173 mg/dL — AB (ref 70–99)
GLUCOSE-CAPILLARY: 332 mg/dL — AB (ref 70–99)
Glucose-Capillary: 202 mg/dL — ABNORMAL HIGH (ref 70–99)
Glucose-Capillary: 253 mg/dL — ABNORMAL HIGH (ref 70–99)

## 2014-01-01 ENCOUNTER — Non-Acute Institutional Stay (SKILLED_NURSING_FACILITY): Payer: Medicare Other | Admitting: Internal Medicine

## 2014-01-01 ENCOUNTER — Encounter (HOSPITAL_COMMUNITY): Payer: Self-pay

## 2014-01-01 ENCOUNTER — Encounter (HOSPITAL_COMMUNITY): Payer: Medicare Other | Attending: Oncology

## 2014-01-01 ENCOUNTER — Encounter: Payer: Self-pay | Admitting: Internal Medicine

## 2014-01-01 VITALS — BP 114/76 | HR 62 | Temp 97.5°F | Resp 18 | Wt 183.4 lb

## 2014-01-01 DIAGNOSIS — D46C Myelodysplastic syndrome with isolated del(5q) chromosomal abnormality: Secondary | ICD-10-CM

## 2014-01-01 DIAGNOSIS — E119 Type 2 diabetes mellitus without complications: Secondary | ICD-10-CM | POA: Insufficient documentation

## 2014-01-01 DIAGNOSIS — N183 Chronic kidney disease, stage 3 unspecified: Secondary | ICD-10-CM

## 2014-01-01 DIAGNOSIS — I4891 Unspecified atrial fibrillation: Secondary | ICD-10-CM

## 2014-01-01 DIAGNOSIS — I214 Non-ST elevation (NSTEMI) myocardial infarction: Secondary | ICD-10-CM

## 2014-01-01 DIAGNOSIS — N1832 Chronic kidney disease, stage 3b: Secondary | ICD-10-CM

## 2014-01-01 DIAGNOSIS — I5023 Acute on chronic systolic (congestive) heart failure: Secondary | ICD-10-CM

## 2014-01-01 DIAGNOSIS — D61818 Other pancytopenia: Secondary | ICD-10-CM

## 2014-01-01 LAB — GLUCOSE, CAPILLARY
Glucose-Capillary: 127 mg/dL — ABNORMAL HIGH (ref 70–99)
Glucose-Capillary: 257 mg/dL — ABNORMAL HIGH (ref 70–99)

## 2014-01-01 NOTE — Progress Notes (Signed)
[facility]  OFFICE PROGRESS NOTE  [name], MD [address] [place] [address]  DIAGNOSIS: Myelodysplastic syndrome with 5 q minus  Pancytopenia  Chronic kidney disease (CKD) stage G3b/A2, moderately decreased glomerular filtration rate (GFR) between 30-44 mL/min/1.73 square meter and albuminuria creatinine ratio between 30-299 mg/g  Non-STEMI (non-ST elevated myocardial infarction), 11/27/2013  Chief Complaint  Patient presents with  . Myelodysplastic syndrome, 5q-  . Non-STEMI 11/27/2013    CURRENT THERAPY: Revlimid 5 mg daily restarted on 12/24/2013.  INTERVAL HISTORY: [name] 80 y.o. male returns for followup of 5q- myelodysplastic syndrome having resumed treatment with Revlimid 5 mg daily on 12/24/2013. He had been hospitalized 11/27/2013 with a non-STEMI and pancytopenia. Repeat bone marrow aspiration and biopsy were done on 12/06/2013 at which time there was no increase in blasts or any evidence of acute leukemia but there exists of a persistence of the 5q- myelodysplastic hormone. He was found to have a decreased GFR to 22 mL per minute wall previously taking Revlimid 10 mg daily. He is ambulating with good appetite and no worsening bruising. He denies any epistaxis, melena, hematochezia, hematuria, fever, night sweats, easy satiety, cough, wheezing, PND, orthopnea, palpitations, or chest pain on exertion.  MEDICAL HISTORY: Past Medical History  Diagnosis Date  . Essential hypertension, benign   . COPD (chronic obstructive pulmonary disease)   . Coronary atherosclerosis of native coronary artery     Multivessel status post CABG 1996  . Arthritis   . Atrial fibrillation     Coumadin stopped 12/2011 due to anemia  . Dysphagia   . Anemia     Requiring blood transfusions  . Carotid stenosis     Dr Scot Dock   . Diabetes mellitus, type 2   . Mixed hyperlipidemia     Statin intolerant  . Hematuria       Felt related to Foley insertion  . Cardiomyopathy     LVEF 45-50%  . Melena     EGD & Colonoscopy 09/2011 revealed gastritis, erosions, scars, diverticula, 8 colon polyps (largest 1.6cm, not removed 2/2 anticoagulation), small AVM in transverse colon  . History of tobacco abuse     Quit 1992  . Anxiety   . GERD (gastroesophageal reflux disease)   . Myocardial infarction   . Thrombocytopenia   . Myelodysplastic syndrome with 5 q minus 10/17/2012    On Revlimid 5 mg daily 21 days on and 7 days off  . Aortic stenosis     Mild  . CKD (chronic kidney disease) stage 3, GFR 30-59 ml/min     INTERIM HISTORY: has DYSLIPIDEMIA; CAD; Atrial fibrillation; PVD; OSTEOARTHRITIS; CAROTID ARTERY STENOSIS; Long term current use of anticoagulant; GI bleeding; AVM (arteriovenous malformation) of colon without hemorrhage; Angina effort; Anemia; History of colonic polyps; Myelodysplastic syndrome with 5 q minus; High output heart failure; Non-ST elevation MI (NSTEMI); Acute on chronic systolic heart failure; Other pancytopenia; HCAP (healthcare-associated pneumonia); Community acquired pneumonia; NSTEMI (non-ST elevated myocardial infarction); Chronic kidney disease (CKD) stage G3b/A2, moderately decreased glomerular filtration rate (GFR) between 30-44 mL/min/1.73 square meter and albuminuria creatinine ratio between 30-299 mg/g; and Diabetes on his problem list.   Myelodysplastic syndrome with 5 q minus    09/05/2012  Bone Marrow Biopsy  5 q- MDS    09/19/2012 - 12/15/2012  Chemotherapy  Approximate start date of Revlimid 5 mg 21 days on and 7 days off    12/16/2012 -  07/18/2013  Chemotherapy  Dose increased to 10 mg Revlimid 21 days on and 7 days off    07/19/2013 -  Chemotherapy  Dose increased to 10 mg daily   11/27/2013 NonSTEMI Revlimid d/c'd, GFR 59ml/mi 12/24/2013                    Chemotherapy          Revlimid $RemoveB'5mg'mBwJStwm$  daily ALLERGIES:  is allergic to statins and tape.  MEDICATIONS: has a current  medication list which includes the following prescription(s): acetaminophen, albuterol, amiodarone, amitriptyline, amlodipine, aspirin ec, bisacodyl, diazepam, furosemide, hydrocodone-acetaminophen, ipratropium-albuterol, isosorbide mononitrate, metoprolol tartrate, multivitamin, nitroglycerin, polyethylene glycol, potassium chloride sa, prednisone, pregabalin, red yeast rice, silodosin, insulin aspart, and lenalidomide.  SURGICAL HISTORY:  Past Surgical History  Procedure Laterality Date  . Knee surgery  1960    Right knee cartilage  . Rotator cuff repair  1990    right   . Inguinal hernia repair  2000 and 2010    left  . Cholecystectomy  05/2010    Lap chole with biologic mesh repair/reinforcement by  Dr Rise Patience  . Back surgery  02/2006    ruptured disk,  post op diskitis infection requiring prolonged hospital stay and  surgical I & D  . Tonsillectomy    . Esophagogastroduodenoscopy  10/08/2011    Procedure: ESOPHAGOGASTRODUODENOSCOPY (EGD);  Surgeon: [name], MD;  Location: [facility];  Service: Endoscopy;  Laterality: N/A;  . Colonoscopy  10/08/2011    Procedure: COLONOSCOPY;  Surgeon: [name], MD;  Location: [facility];  Service: Endoscopy;  Laterality: N/A;  . Cataract extraction, bilateral    . Peg placement  07/2006    placed due to prolonged infection, poor po intake, malnutrition during several week hospital stay resulting from ruptured disc that became infected following surgery  . Colonoscopy  01/14/2012    Procedure: COLONOSCOPY;  Surgeon: [name], MD;  Location: [facility];  Service: Endoscopy;  Laterality: N/A;  945  . Coronary artery bypass graft  1996    CABG x 4  . Ventral hernia repair    . Colonoscopy  04/20/2012    Procedure: COLONOSCOPY;  Surgeon: [name], MD;  Location: [facility];  Service: Endoscopy;  Laterality: N/A;  1030  . Pr vein bypass graft,aorto-fem-pop  1996  . Spine surgery    . Joint replacement  1960     Right Knee  . Lymph node biopsy  08/15/2012    Procedure: LYMPH NODE BIOPSY;  Surgeon: Scherry Ran, MD;  Location: [facility];  Service: General;  Laterality: Left;  Cervical Lymph Node Bx in Minor Room  . Bone marrow biopsy    . Bone marrow aspiration      FAMILY HISTORY: family history includes Cancer in his father and sister; Coronary artery disease in his mother; Heart disease in his brother and sister; Hyperlipidemia in his sister; Hypertension in his sister. There is no history of Anesthesia problems, Hypotension, Malignant hyperthermia, or Pseudochol deficiency.  SOCIAL HISTORY:  reports that he quit smoking about 23 years ago. His smoking use included Cigarettes. He has a 80 pack-year smoking history. He has never used smokeless tobacco. He reports that he does not drink alcohol or use illicit drugs.  REVIEW OF SYSTEMS:  Other than that discussed above is noncontributory.  PHYSICAL EXAMINATION: ECOG PERFORMANCE STATUS: 1 - Symptomatic but completely ambulatory  Blood pressure 114/76, pulse 62, temperature 97.5 F (36.4 C),  temperature source Oral, resp. rate 18, weight 183 lb 6.4 oz (83.19 kg), SpO2 100.00%.  GENERAL:alert, no distress and comfortable SKIN: skin color, texture, turgor are normal, no rashes or significant lesions EYES: PERLA; Conjunctiva are pink and non-injected, sclera clear SINUSES: No redness or tenderness over maxillary or ethmoid sinuses OROPHARYNX:no exudate, no erythema on lips, buccal mucosa, or tongue. NECK: supple, thyroid normal size, non-tender, without nodularity. No masses CHEST: Increased AP diameter with no gynecomastia. LYMPH:  no palpable lymphadenopathy in the cervical, axillary or inguinal LUNGS: clear to auscultation and percussion with normal breathing effort HEART: regular rate & rhythm and no murmurs. No S3. ABDOMEN:abdomen soft, non-tender and normal bowel sounds MUSCULOSKELETAL:no cyanosis of digits and no clubbing. Range of motion  normal. Upper extremity ecchymoses are fading. NEURO: alert & oriented x 3 with fluent speech, no focal motor/sensory deficits   LABORATORY DATA:  SOLTAS: 01/01/2014: WBC 4.3, hemoglobin 8.7, platelets 40,000 with ANC of 3.1.   Admission on 12/08/2013  No results displayed because visit has over 200 results.    Office Visit on 12/14/2013  Component Date Value Ref Range Status  . WBC 12/14/2013 5.1  4.0 - 10.5 K/uL Final  . RBC 12/14/2013 3.47* 4.22 - 5.81 MIL/uL Final  . Hemoglobin 12/14/2013 10.5* 13.0 - 17.0 g/dL Final  . HCT 12/14/2013 31.8* 39.0 - 52.0 % Final  . MCV 12/14/2013 91.6  78.0 - 100.0 fL Final  . MCH 12/14/2013 30.3  26.0 - 34.0 pg Final  . MCHC 12/14/2013 33.0  30.0 - 36.0 g/dL Final  . RDW 12/14/2013 16.8* 11.5 - 15.5 % Final  . Platelets 12/14/2013 49* 150 - 400 K/uL Final  . Neutrophils Relative % 12/14/2013 70  43 - 77 % Final  . Lymphocytes Relative 12/14/2013 22  12 - 46 % Final  . Monocytes Relative 12/14/2013 1* 3 - 12 % Final  . Eosinophils Relative 12/14/2013 5  0 - 5 % Final  . Basophils Relative 12/14/2013 2* 0 - 1 % Final  . Neutro Abs 12/14/2013 3.5  1.7 - 7.7 K/uL Final  . Lymphs Abs 12/14/2013 1.1  0.7 - 4.0 K/uL Final  . Monocytes Absolute 12/14/2013 0.1  0.1 - 1.0 K/uL Final  . Eosinophils Absolute 12/14/2013 0.3  0.0 - 0.7 K/uL Final  . Basophils Absolute 12/14/2013 0.1  0.0 - 0.1 K/uL Final  . Smear Review 12/14/2013 PLATELET CLUMPS NOTED ON SMEAR, COUNT APPEARS DECREASED   Final  Admission on 11/27/2013, Discharged on 12/08/2013  No results displayed because visit has over 200 results.    Hospital Outpatient Visit on 12/06/2013  Component Date Value Ref Range Status  . Bone Marrow Exam 12/06/2013 SEE PATHOLOGY REPORT [id number]   Final  . Chromosome-Routine 12/06/2013 SEE SEPARATE REPORT   Final   Performed at [facility]  . Tissue hybridization (bone marrow)* 12/06/2013 SEE SEPARATE REPORT   Final   Performed at [facility]: for GEREMIAH, FUSSELL ([id number]) Patient: ALBERTA, CAIRNS Collected: 12/06/2013 Client: [facility] Accession: [id number] Received: 12/06/2013 D. Arne Cleveland DOB: 11-03-1931 Age: 40 Gender: M Reported: 12/07/2013 [address] Patient Ph: [phone] MRN #: [id number] [place], [place] [address] Visit #: [id number] Chart #: Phone: [phone] Fax: CC: Robynn Pane, PA-C BONE MARROW REPORT FINAL DIAGNOSIS Diagnosis Bone Marrow, Aspirate,Biopsy, and Clot, right iliac - HYPERCELLULAR BONE MARROW FOR AGE WITH TRILINEAGE HEMATOPOIESIS. - SEE COMMENT. PERIPHERAL BLOOD: - PANCYTOPENIA. Diagnosis Note Compared to previous bone  marrow biopsy ([id number]), the dyspoietic changes are much less pronounced with only subtle changes and a background of trilineage hematopoiesis with progressive maturation. There is no increase in blastic cells as primarily seen by morphology and hence there is no evidence of acute leukemia. Correlation with cytogenetic studies is recommended. 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 There is mild poikilocytosis with elliptocytes and tear drop cells. There is mild polychromasia. The white blood cells are decreased in number. Many neutrophils display toxic granulation but with normal lobation for the most part.  Significant neutrophilic left shift is not seen on scan. The platelets are decreased in number. BONE MARROW ASPIRATE: Erythroid precursors: Progressive maturation with lack of significant dyspoiesis. Granulocytic precursors: Progression maturation. Maturing cells display toxic granulation. No increase in blastic cells identified. Megakaryocytes: Abundant with predominantly normal morphology although occasional hypolobated forms are present. Lymphocytes/plasma cells: Large aggregates are not present. TOUCH PREPARATIONS: A mixture of cell types present. CLOT and BIOPSY: The sections show variable cellularity ranging from 30 to 70% with a mixture of myeloid cell types. Expansile sheets of blastic cells are not identified. IRON STAIN: Iron stains are performed on a bone marrow aspirate smear and section of clot. The controls stained appropriately. Storage Iron: Increased Ringed Sideroblasts: A few cells present ADDITIONAL DATA / TESTING: Specimen was sent for cytogenetic analysis and a separate report will follow. (BNS:caf 12/24/2013) Specimen Table Bone Marrow count performed on 500 cells shows: Blasts: 0% Myeloid 54% Promyelocyts: 2% Myelocytes: 13% Erythroid 29% Metamyelocyts: 1% Bands: 10% Lymphocytes: 14% Neutrophils: 16% Eosinophils: 12% Plasma Cells: 2% Basophils: 0% Monocytes: 1% M:E ratio: 1.92 Gross Received in [employer] is a 1.5 x 1.4 x 0.1 cm aggregate of dark red soft tissue/material, submitted in block A. Also received in [facility] in a separate container is a 2 x 0.2 cm core of tan-red firm tissue, which is submitted in block B following decalcification. (SSW:ecj 12/06/2013) Stain(s) Used in Diagnosis The following stain(s) were used in diagnosing the case: Iron Stain. The control(s) stained appropriately. 2 of 3 FINAL for DANYAL, WHITENACK ([id number]) Report signed out from following location(s) Technical Component and Interpretation performed at [facility], [place], [place] [address]. CLIA  Urinalysis    Component Value Date/Time   COLORURINE YELLOW 11/27/2013 0709   APPEARANCEUR CLEAR 11/27/2013 0709   LABSPEC 1.025 11/27/2013 0709   PHURINE 5.5 11/27/2013 0709   GLUCOSEU >1000* 11/27/2013 0709   GLUCOSEU 100 01/27/2012 1255   HGBUR SMALL* 11/27/2013 0709   BILIRUBINUR NEGATIVE 11/27/2013 0709   KETONESUR TRACE* 11/27/2013 0709   PROTEINUR 30* 11/27/2013 0709   UROBILINOGEN 0.2 11/27/2013 0709   NITRITE NEGATIVE 11/27/2013 0709   LEUKOCYTESUR NEGATIVE 11/27/2013 0709    RADIOGRAPHIC STUDIES: US Renal  12/03/2013   CLINICAL DATA:  Assess for possible obstruction, evaluate renal size.  EXAM: RENAL/URINARY TRACT ULTRASOUND COMPLETE  COMPARISON:  None.  FINDINGS: Right Kidney:  Length: 10.7 cm. The echotexture of the renal cortex is lower than  that of the adjacent liver which is normal. There is an upper pole cyst measuring 2.6 cm in diameter.  Left Kidney:  Length: 10.6 cm. Echogenicity within normal limits. No mass or hydronephrosis visualized.  Bladder:  The prostate gland is enlarged and produces a moderate impression upon the urinary bladder base. No acute bladder abnormality is demonstrated.  IMPRESSION: 1. The kidneys demonstrate normal size and echotexture. There is no evidence of hydronephrosis. 2. There is a simple appearing cyst in the upper pole of the right kidney. 3. There is prostatic enlargement with produces a prominent impression upon the urinary bladder base.   Electronically Signed   By: David  Martinique   On: 12/03/2013 09:26   Ct Biopsy  12/06/2013   CLINICAL DATA:  Pancytopenia  EXAM: CT GUIDED DEEP ILIAC BONE ASPIRATION AND CORE BIOPSY  TECHNIQUE: The procedure, risks (including but not limited to bleeding, infection, organ damage ), benefits, and alternatives were explained to the patient. Questions regarding the procedure were encouraged and answered. The patient understands and consents to the procedure. Patient was placed  supine on the CT gantry and limited axial scans through the pelvis were obtained. Appropriate skin entry site was identified. Skin site was marked, prepped with Betadine, draped in usual sterile fashion, and infiltrated locally with 1% lidocaine.  Intravenous Fentanyl and Versed were administered as conscious sedation during continuous cardiorespiratory monitoring by the radiology RN, with a total moderate sedation time of 10 minutes.  Under CT fluoroscopic guidance an 11-gauge Cook trocar bone needle was advanced into the right iliac bone just lateral to the sacroiliac joint. Once needle tip position was confirmed, coaxial core and aspiration samples were obtained. The final sample was obtained using the guiding needle itself, which was then removed. Post procedure scans show no hematoma or fracture. Patient tolerated procedure well, with no immediate complication.  IMPRESSION: 1. Technically successful CT guided right iliac bone core and aspiration biopsy.   Electronically Signed   By: Arne Cleveland M.D.   On: 12/06/2013 11:41   Dg Chest Port 1 View  12/07/2013   CLINICAL DATA:  Shortness of breath.  EXAM: PORTABLE CHEST - 1 VIEW  COMPARISON:  12/01/2013  FINDINGS: Cardiomegaly with vascular congestion. Diffuse interstitial prominence throughout the lungs with perihilar and lower lobe opacities likely reflecting mild to moderate edema. Possible trace bilateral effusions.  Prior CABG.  No acute bony abnormality.  IMPRESSION: Findings compatible with mild to moderate CHF.   Electronically Signed   By: Rolm Baptise M.D.   On: 12/07/2013 09:21    ASSESSMENT:  #1. Myelodysplastic syndrome,5q- persistence in the bone marrow but without evidence of progression to AML or excessive blasts, tolerating 5 mg Revlimid daily #2. Recent pancytopenia secondary to Revlimid toxicity.  #3. Decrease in renal function down to 22 milliliters per minute GFR from 55 mL per minute.  #4. Coronary artery disease, status post  nonSTEMI     PLAN:  #1. Continue Revlimid 5 mg daily. #2. Call for fever or bleeding or worsening bruising. #3. Followup in 4 weeks with CBC. He also be discharged from [facility] questions were answered. The patient knows to call the clinic with any problems, questions or concerns. We can certainly see the patient much sooner if necessary.   I spent 25 minutes counseling the patient face to face. The total time spent in the appointment was 30 minutes.    Farrel Gobble, MD 01/02/2014 6:30 AM

## 2014-01-01 NOTE — Progress Notes (Signed)
Patient ID: [name], male   DOB: 02-17-1932, 80 y.o.   MRN: [id number]                    This is a discharge note.  Level care skilled.  Facility [facility].       CHIEF COMPLAINT:  Discharge note.     HISTORY OF PRESENT ILLNESS:  This is a 80 year-old man who developed acute shortness of breath and came to the emergency department.  He was felt to have pneumonia, was started on antibiotics.  He required BiPAP.     He was found to have a non-ST elevation MI as well as acute-on-chronic heart failure, which was treated.  However, with diuresis, he developed increasing problems with his renal functions.  Diuretics were discontinued and his renal functions  slowly improved,--.     He had a bone marrow while he was in the hospital to check on the status of his myelodysplastic syndrome (5Q minus disease).--This is followed by hematology and appears he was seen by his hematologist today with recommendation for follow up in several weeks and restarting Revlimid See updated labs were done on April 6 which shows a reduction in his hemoglobin of 7.7 he has a white count of 3.0 and platelets of 35,000 he does have chronic pancytopenia-I'm not sure if his hematologist saw this lab and he will have to be notified tomorrow morning of this.         Patient initially was admitted here for profound lower extremity weakness and immobility.--Apparently he has made significant progress.  He is a type II diabetic his blood sugars appear to be variable appear to run mainly in the low 100s consistently in the morning however they do go somewhat higher later in the day largely in the 200s with some occasional 300 readings later in the day and at at bedtime.  He does have a history of a fibrillation he is on aspirin as well as metoprolol for rate control.  In regards to his chronic systolic heart failure he is on low-dose Lasix with potassium supplementation his weights have been stable to  actually declining some recent weight 186.3 admission weight last month was 195.6  Tonight he has no complaints says he feels better stronger does not complaining of any shortness of breath or chest pain before going home he will be with his wife he will need therapy             PAST MEDICAL HISTORY/PROBLEM LIST:    Coronary artery disease, status post CABG in the 1990s.    Atrial fibrillation.    Myelodysplastic syndrome with 5Q minus.    Healthcare-acquired pneumonia.    Recent non-ST elevation MI (see discussion above).    The patient says he is a borderline diabetic.    History of hyperlipidemia.    Osteoarthritis.    Coronary artery stenosis.    AVMs of the colon.    GI bleeding.    History of colonic polyps.     CURRENT MEDICATIONS:  Medication list is reviewed.   Amiodarone 200 mg a day.    Amitriptyline 30 mg at bedtime.    Amlodipine 5 mg at bedtime.    ASA 81 twice a day.    .    Lasix 20 mg once a day.    Hydrocodone/acetaminophen 5/325, 1 tablet p.o. q.6 p.r.n.    DuoNebs q.6 p.r.n.    Imdur 30 mg daily.  Lyrica 75 once a day.    Metoprolol 25 twice a day.    MiraLAX 17 g once a day.    Nitrostat p.r.n.    NovoLog sliding scale insulin.   .    Prednisone 15 mg once a day.    Potassium 20 mEq a day.    Proventil 2 puffs every 6 hours p.r.n.    Rapaflo 8 mg, 1 tablet daily .Rivlimid 5 mg QD     SOCIAL HISTORY:   HOUSING:  The patient t lives with his wife.    FUNCTIONAL STATUS:  He was functionally independent prior to admission.  Does not use a cane or a walker, per the patient.    TOBACCO USE:  He is an ex-smoker, quitting 17 years ago.     REVIEW OF SYSTEMS: Gen. says he feels good.  Head ears eyes nose mouth and throat-does not complain of visual changes or sore throat   CHEST/RESPIRATORY:  Denies shortness of breath or cough    CARDIAC:   No complaints of chest pain or shortness of breath.Marland Kitchen     GI:  No nausea or vomiting.  He does not  describe dysphagia.  No abdominal pain.    GU:  No dysuria.    MUSCULOSKELETAL: Does not complaining of any pain he is now walking however appear to be somewhat unsteady he has not used his when necessary Vicodin apparently for some time.  Neurologic does not complaining of headache or dizziness.  .     PHYSICAL EXAMINATION:    Temperature 97.6 pulse 62 respirations 20 blood pressure 115/52 weight is 186.4 In general this is a pleasant elderly male in no distress sitting in his wheelchair.  His skin is warm and dry he does have some chronic bruising of his extremities     HEENT:    MOUTH/THROAT:  Oropharynx clear mucous membranes moist    CHEST/RESPIRATORY:  No labored breathing.  There is no wheezing.  Air entry is fairly good.    CARDIOVASCULAR:   CARDIAC:   Heart sounds are soft.  There are no gallops.  There is H-2/9 systolic ejection murmur at the aortic area.  Marland Kitchen    GASTROINTESTINAL:   ABDOMEN:  Soft nontender with positive bowel sounds.        NEUROLOGICAL:     SENSATION/STRENGTH:  He has antigravity strength--he is able to stand unassisted I do not see any lateralizing findings his cranial nerves are grossly intact speech is clear Gait-still somewhat unsteady when he is walking unassisted he will need continued therapy for strengthening .       Labs. 01/01/2014.  CBC 4.3 hemoglobin 8.7 platelets 48,000.  12/31/2013.  WBC 3.0 hemoglobin 7.7 platelets 35,000.  Sodium 141 potassium 3.9. BUN 42 creatinine 2.23.      .     ASSESSMENT/PLAN: Congestive heart failure.  He has had a recent non-ST elevation MI--this appears actually be stable clinically will be followed by primary care provider and cardiology as needed.   History of acute on chronic renal failure that.   His recent creatinine appears to be baseline . Blood sugars appear to be stable in the morning however do rise later in the day he is on sliding scale insulin we'll have to make sure he is  comfortable with this before discharge Will need diabetic teaching   .   -   .    History of atrial fibrillation.   He is on amiodarone.also betablocker as well as  aspirin this appears to be stable     Community-acquired pneumonia on admission.  This appears to have resolved.  History of myelodysplastic syndrome send updated labs to hematology for followup again he is followed closely by hematology--they have adjusted his medications  He will need PT OT for continued strengthening as well as home health support for his numerous medical issues including CHF atrial fibrillation and diabetes also will need prompt follow up with his primary care provider  [phone] note greater than 30 minutes spent preparing this discharge summary-of note greater than 50% of time spent coordinating plan of care

## 2014-01-01 NOTE — Patient Instructions (Signed)
[facility] Discharge Instructions  RECOMMENDATIONS MADE BY THE CONSULTANT AND ANY TEST RESULTS WILL BE SENT TO YOUR REFERRING PHYSICIAN. We will see you in 4 weeks for a doctor visit and lab work (CBC) If you are discharged from the [facility] tomorrow, Please call the clinic in the am for your CBC results, we will let you know if we need to adjust your medications. Continue Revlimid 5mg  daily.   Thank you for choosing [facility] to provide your oncology and hematology care.  To afford each patient quality time with our providers, please arrive at least 15 minutes before your scheduled appointment time.  With your help, our goal is to use those 15 minutes to complete the necessary work-up to ensure our physicians have the information they need to help with your evaluation and healthcare recommendations.    Effective January 1st, 2014, we ask that you re-schedule your appointment with our physicians should you arrive 10 or more minutes late for your appointment.  We strive to give you quality time with our providers, and arriving late affects you and other patients whose appointments are after yours.    Again, thank you for choosing [facility].  Our hope is that these requests will decrease the amount of time that you wait before being seen by our physicians.       _____________________________________________________________  Should you have questions after your visit to [facility], please contact our office at [phone] between the hours of 8:30 a.m. and 5:00 p.m.  Voicemails left after 4:30 p.m. will not be returned until the following business day.  For prescription refill requests, have your pharmacy contact our office with your prescription refill request.

## 2014-01-02 ENCOUNTER — Other Ambulatory Visit (HOSPITAL_COMMUNITY): Payer: Self-pay | Admitting: Hematology and Oncology

## 2014-01-02 LAB — GLUCOSE, CAPILLARY
GLUCOSE-CAPILLARY: 315 mg/dL — AB (ref 70–99)
GLUCOSE-CAPILLARY: 235 mg/dL — AB (ref 70–99)
Glucose-Capillary: 245 mg/dL — ABNORMAL HIGH (ref 70–99)
Glucose-Capillary: 143 mg/dL — ABNORMAL HIGH (ref 70–99)

## 2014-01-08 ENCOUNTER — Other Ambulatory Visit (HOSPITAL_COMMUNITY): Payer: Self-pay | Admitting: Oncology

## 2014-01-08 DIAGNOSIS — D46C Myelodysplastic syndrome with isolated del(5q) chromosomal abnormality: Secondary | ICD-10-CM

## 2014-01-08 MED ORDER — LENALIDOMIDE 5 MG PO CAPS: 5.0000 mg | ORAL_CAPSULE | Freq: Every day | ORAL | Status: DC

## 2014-01-24 ENCOUNTER — Other Ambulatory Visit (HOSPITAL_BASED_OUTPATIENT_CLINIC_OR_DEPARTMENT_OTHER): Payer: Self-pay | Admitting: Internal Medicine

## 2014-01-30 ENCOUNTER — Encounter (HOSPITAL_COMMUNITY): Payer: Medicare Other | Attending: Oncology

## 2014-01-30 ENCOUNTER — Encounter (HOSPITAL_COMMUNITY): Payer: Self-pay

## 2014-01-30 ENCOUNTER — Encounter (HOSPITAL_BASED_OUTPATIENT_CLINIC_OR_DEPARTMENT_OTHER): Payer: Medicare Other

## 2014-01-30 VITALS — BP 99/54 | HR 60 | Temp 97.8°F | Resp 18 | Wt 183.5 lb

## 2014-01-30 DIAGNOSIS — D46C Myelodysplastic syndrome with isolated del(5q) chromosomal abnormality: Secondary | ICD-10-CM | POA: Insufficient documentation

## 2014-01-30 LAB — CBC WITH DIFFERENTIAL/PLATELET
Basophils Absolute: 0.1 10*3/uL (ref 0.0–0.1)
Basophils Relative: 4 % — ABNORMAL HIGH (ref 0–1)
Eosinophils Absolute: 0.3 10*3/uL (ref 0.0–0.7)
Eosinophils Relative: 9 % — ABNORMAL HIGH (ref 0–5)
HCT: 25.9 % — ABNORMAL LOW (ref 39.0–52.0)
Hemoglobin: 8.6 g/dL — ABNORMAL LOW (ref 13.0–17.0)
LYMPHS PCT: 49 % — AB (ref 12–46)
Lymphs Abs: 1.8 10*3/uL (ref 0.7–4.0)
MCH: 32.2 pg (ref 26.0–34.0)
MCHC: 33.2 g/dL (ref 30.0–36.0)
MCV: 97 fL (ref 78.0–100.0)
Monocytes Absolute: 0.2 10*3/uL (ref 0.1–1.0)
Monocytes Relative: 5 % (ref 3–12)
NEUTROS ABS: 1.2 10*3/uL — AB (ref 1.7–7.7)
Neutrophils Relative %: 33 % — ABNORMAL LOW (ref 43–77)
PLATELETS: 40 10*3/uL — AB (ref 150–400)
RBC: 2.67 MIL/uL — ABNORMAL LOW (ref 4.22–5.81)
RDW: 21.5 % — ABNORMAL HIGH (ref 11.5–15.5)
SMEAR REVIEW: DECREASED
WBC: 3.6 10*3/uL — AB (ref 4.0–10.5)

## 2014-01-30 NOTE — Progress Notes (Signed)
[facility]  OFFICE PROGRESS NOTE  [name], MD [place] [address]  DIAGNOSIS: Myelodysplastic syndrome with 5 q minus  Chief Complaint  Patient presents with  . MDS 5 q-    CURRENT THERAPY: Revlimid 5 mg daily continuously. Non-STEMI 11/27/2013  INTERVAL HISTORY: [name] 80 y.o. male returns for followup of myelodysplastic syndrome with 5q-  cytogenetics while taking Revlimid 5 mg daily in the setting of chronic renal failure, stage III. He continues to bruise from the elbows down but not on his legs. He denies any epistaxis, melena, hematochezia, hematuria, fever, or night sweats. He denies easy satiety, lower extremity swelling or redness, PND, orthopnea, palpitations, cough, shortness of breath, nasal drip, earache, or headache. Appetite has been good.  MEDICAL HISTORY: Past Medical History  Diagnosis Date  . Essential hypertension, benign   . COPD (chronic obstructive pulmonary disease)   . Coronary atherosclerosis of native coronary artery     Multivessel status post CABG 1996  . Arthritis   . Atrial fibrillation     Coumadin stopped 12/2011 due to anemia  . Dysphagia   . Anemia     Requiring blood transfusions  . Carotid stenosis     Dr Scot Dock   . Diabetes mellitus, type 2   . Mixed hyperlipidemia     Statin intolerant  . Hematuria     Felt related to Foley insertion  . Cardiomyopathy     LVEF 45-50%  . Melena     EGD & Colonoscopy 09/2011 revealed gastritis, erosions, scars, diverticula, 8 colon polyps (largest 1.6cm, not removed 2/2 anticoagulation), small AVM in transverse colon  . History of tobacco abuse     Quit 1992  . Anxiety   . GERD (gastroesophageal reflux disease)   . Myocardial infarction   . Thrombocytopenia   . Myelodysplastic syndrome with 5 q minus 10/17/2012    On Revlimid 5 mg daily 21 days on and 7 days off  . Aortic stenosis     Mild  . CKD  (chronic kidney disease) stage 3, GFR 30-59 ml/min   . Pneumonia 11/2013    history of    INTERIM HISTORY: has DYSLIPIDEMIA; CAD; Atrial fibrillation; PVD; OSTEOARTHRITIS; CAROTID ARTERY STENOSIS; Long term current use of anticoagulant; GI bleeding; AVM (arteriovenous malformation) of colon without hemorrhage; Angina effort; Anemia; History of colonic polyps; Myelodysplastic syndrome with 5 q minus; High output heart failure; Non-ST elevation MI (NSTEMI); Acute on chronic systolic heart failure; Other pancytopenia; HCAP (healthcare-associated pneumonia); Community acquired pneumonia; NSTEMI (non-ST elevated myocardial infarction); Chronic kidney disease (CKD) stage G3b/A2, moderately decreased glomerular filtration rate (GFR) between 30-44 mL/min/1.73 square meter and albuminuria creatinine ratio between 30-299 mg/g; and Diabetes on his problem list.    ALLERGIES:  is allergic to statins and tape.  MEDICATIONS: has a current medication list which includes the following prescription(s): acetaminophen, albuterol, amiodarone, amitriptyline, amlodipine, aspirin ec, bisacodyl, diazepam, furosemide, hydrocodone-acetaminophen, ipratropium-albuterol, isosorbide mononitrate, lenalidomide, metformin hcl, metoprolol tartrate, multivitamin, nitroglycerin, polyethylene glycol, potassium chloride sa, prednisone, pregabalin, red yeast rice, silodosin, and insulin aspart.  SURGICAL HISTORY:  Past Surgical History  Procedure Laterality Date  . Knee surgery  1960    Right knee cartilage  . Rotator cuff repair  1990    right   . Inguinal hernia repair  2000 and 2010    left  . Cholecystectomy  05/2010    Lap chole with biologic  mesh repair/reinforcement by  Dr Zachery Dakins  . Back surgery  02/2006    ruptured disk,  post op diskitis infection requiring prolonged hospital stay and  surgical I & D  . Tonsillectomy    . Esophagogastroduodenoscopy  10/08/2011    Procedure: ESOPHAGOGASTRODUODENOSCOPY (EGD);  Surgeon:  Malissa Hippo, MD;  Location: [facility];  Service: Endoscopy;  Laterality: N/A;  . Colonoscopy  10/08/2011    Procedure: COLONOSCOPY;  Surgeon: Malissa Hippo, MD;  Location: [facility];  Service: Endoscopy;  Laterality: N/A;  . Cataract extraction, bilateral    . Peg placement  07/2006    placed due to prolonged infection, poor po intake, malnutrition during several week hospital stay resulting from ruptured disc that became infected following surgery  . Colonoscopy  01/14/2012    Procedure: COLONOSCOPY;  Surgeon: Malissa Hippo, MD;  Location: [facility];  Service: Endoscopy;  Laterality: N/A;  945  . Coronary artery bypass graft  1996    CABG x 4  . Ventral hernia repair    . Colonoscopy  04/20/2012    Procedure: COLONOSCOPY;  Surgeon: Malissa Hippo, MD;  Location: [facility];  Service: Endoscopy;  Laterality: N/A;  1030  . Pr vein bypass graft,aorto-fem-pop  1996  . Spine surgery    . Joint replacement  1960    Right Knee  . Lymph node biopsy  08/15/2012    Procedure: LYMPH NODE BIOPSY;  Surgeon: Marlane Hatcher, MD;  Location: [facility] ORS;  Service: General;  Laterality: Left;  Cervical Lymph Node Bx in Minor Room  . Bone marrow biopsy    . Bone marrow aspiration      FAMILY HISTORY: family history includes Cancer in his father and sister; Coronary artery disease in his mother; Heart disease in his brother and sister; Hyperlipidemia in his sister; Hypertension in his sister. There is no history of Anesthesia problems, Hypotension, Malignant hyperthermia, or Pseudochol deficiency.  SOCIAL HISTORY:  reports that he quit smoking about 23 years ago. His smoking use included Cigarettes. He has a 80 pack-year smoking history. He has never used smokeless tobacco. He reports that he does not drink alcohol or use illicit drugs.  REVIEW OF SYSTEMS:  Other than that discussed above is noncontributory.  PHYSICAL EXAMINATION: ECOG PERFORMANCE STATUS: 1 - Symptomatic but completely  ambulatory  Blood pressure 99/54, pulse 60, temperature 97.8 F (36.6 C), temperature source Tympanic, resp. rate 18, weight 183 lb 8 oz (83.235 kg), SpO2 100.00%.  GENERAL:alert, no distress and comfortable SKIN: skin color, texture, turgor are normal, no rashes or significant lesions EYES: PERLA; Conjunctiva are pink and non-injected, sclera clear SINUSES: No redness or tenderness over maxillary or ethmoid sinuses OROPHARYNX:no exudate, no erythema on lips, buccal mucosa, or tongue. NECK: supple, thyroid normal size, non-tender, without nodularity. No masses CHEST: Increased AP diameter with no gynecomastia. LYMPH:  no palpable lymphadenopathy in the cervical, axillary or inguinal LUNGS: clear to auscultation and percussion with normal breathing effort HEART: regular rate & rhythm. Grade 2/6 pansystolic murmur at the apex rating to the left axilla. ABDOMEN:abdomen soft, non-tender and normal bowel sounds MUSCULOSKELETAL:no cyanosis of digits and no clubbing. Range of motion normal. Bilateral upper extremity ecchymoses. NEURO: alert & oriented x 3 with fluent speech, no focal motor/sensory deficits   LABORATORY DATA: Infusion on 01/30/2014  Component Date Value Ref Range Status  . WBC 01/30/2014 3.6* 4.0 - 10.5 K/uL Final  . RBC 01/30/2014 2.67* 4.22 - 5.81 MIL/uL  Final  . Hemoglobin 01/30/2014 8.6* 13.0 - 17.0 g/dL Final  . HCT 01/30/2014 25.9* 39.0 - 52.0 % Final  . MCV 01/30/2014 97.0  78.0 - 100.0 fL Final  . MCH 01/30/2014 32.2  26.0 - 34.0 pg Final  . MCHC 01/30/2014 33.2  30.0 - 36.0 g/dL Final  . RDW 01/30/2014 21.5* 11.5 - 15.5 % Final  . Platelets 01/30/2014 40* 150 - 400 K/uL Final   Comment: SPECIMEN CHECKED FOR CLOTS                          PLATELET COUNT CONFIRMED BY SMEAR  . Neutrophils Relative % 01/30/2014 33* 43 - 77 % Final  . Neutro Abs 01/30/2014 1.2* 1.7 - 7.7 K/uL Final  . Lymphocytes Relative 01/30/2014 49* 12 - 46 % Final  . Lymphs Abs 01/30/2014 1.8   0.7 - 4.0 K/uL Final  . Monocytes Relative 01/30/2014 5  3 - 12 % Final  . Monocytes Absolute 01/30/2014 0.2  0.1 - 1.0 K/uL Final  . Eosinophils Relative 01/30/2014 9* 0 - 5 % Final  . Eosinophils Absolute 01/30/2014 0.3  0.0 - 0.7 K/uL Final  . Basophils Relative 01/30/2014 4* 0 - 1 % Final  . Basophils Absolute 01/30/2014 0.1  0.0 - 0.1 K/uL Final  . Smear Review 01/30/2014 PLATELETS APPEAR DECREASED   Final  Admission on 12/08/2013, Discharged on 01/02/2014  No results displayed because visit has over 200 results.      PATHOLOGY: No new pathology. Most recent bone marrow examination did reveal persistence of 5 q- cytogenetics.  Urinalysis    Component Value Date/Time   COLORURINE YELLOW 11/27/2013 0709   APPEARANCEUR CLEAR 11/27/2013 0709   LABSPEC 1.025 11/27/2013 0709   PHURINE 5.5 11/27/2013 0709   GLUCOSEU >1000* 11/27/2013 0709   GLUCOSEU 100 01/27/2012 1255   HGBUR SMALL* 11/27/2013 0709   BILIRUBINUR NEGATIVE 11/27/2013 0709   KETONESUR TRACE* 11/27/2013 0709   PROTEINUR 30* 11/27/2013 0709   UROBILINOGEN 0.2 11/27/2013 0709   NITRITE NEGATIVE 11/27/2013 0709   LEUKOCYTESUR NEGATIVE 11/27/2013 0709    RADIOGRAPHIC STUDIES: No results found.  ASSESSMENT:  #1. Myelodysplastic syndrome with 5q- cytogenetics. #2. Recent pancytopenia secondary to Revlimid toxicity, improved. #3. Decrease in renal function down to 22 milliliters per minute GFR from 55 mL per minute.  #4. Coronary artery disease, status post nonSTEMI     PLAN:  #1. Continue Revlimid 5 mg daily and call if develops fever or bleeding. #2. Followup in one month with CBC and chem profile   All questions were answered. The patient knows to call the clinic with any problems, questions or concerns. We can certainly see the patient much sooner if necessary.   I spent 25 minutes counseling the patient face to face. The total time spent in the appointment was 30 minutes.    Farrel Gobble, MD 01/30/2014 11:43  AM  DISCLAIMER:  This note was dictated with voice recognition software.  Similar sounding words can inadvertently be transcribed inaccurately and may not be corrected upon review.

## 2014-01-30 NOTE — Progress Notes (Signed)
[name] presented for [employer]. Labs per MD order drawn via Peripheral Line 23 gauge needle inserted in right AC  Good blood return present. Procedure without incident.  Needle removed intact. Patient tolerated procedure well.

## 2014-01-30 NOTE — Patient Instructions (Addendum)
[place] Discharge Instructions  RECOMMENDATIONS MADE BY THE CONSULTANT AND ANY TEST RESULTS WILL BE SENT TO YOUR REFERRING PHYSICIAN.  We will see you back in 4 weeks for a doctor appointment and repeat labs.  We will call about your lab results and if you need to continue Revlamid.   Thank you for choosing [facility] to provide your oncology and hematology care.  To afford each patient quality time with our providers, please arrive at least 15 minutes before your scheduled appointment time.  With your help, our goal is to use those 15 minutes to complete the necessary work-up to ensure our physicians have the information they need to help with your evaluation and healthcare recommendations.    Effective January 1st, 2014, we ask that you re-schedule your appointment with our physicians should you arrive 10 or more minutes late for your appointment.  We strive to give you quality time with our providers, and arriving late affects you and other patients whose appointments are after yours.    Again, thank you for choosing [facility].  Our hope is that these requests will decrease the amount of time that you wait before being seen by our physicians.       _____________________________________________________________  Should you have questions after your visit to [facility], please contact our office at [phone] between the hours of 8:30 a.m. and 5:00 p.m.  Voicemails left after 4:30 p.m. will not be returned until the following business day.  For prescription refill requests, have your pharmacy contact our office with your prescription refill request.

## 2014-02-07 ENCOUNTER — Other Ambulatory Visit (HOSPITAL_COMMUNITY): Payer: Self-pay | Admitting: Pulmonary Disease

## 2014-02-07 ENCOUNTER — Ambulatory Visit (HOSPITAL_COMMUNITY)
Admission: RE | Admit: 2014-02-07 | Discharge: 2014-02-07 | Disposition: A | Discharge status: Home / Self Care | Payer: Medicare Other | Source: Ambulatory Visit | Attending: Pulmonary Disease | Admitting: Pulmonary Disease

## 2014-02-07 DIAGNOSIS — W19XXXA Unspecified fall, initial encounter: Secondary | ICD-10-CM

## 2014-02-07 DIAGNOSIS — S0990XA Unspecified injury of head, initial encounter: Secondary | ICD-10-CM

## 2014-02-07 DIAGNOSIS — W1800XA Striking against unspecified object with subsequent fall, initial encounter: Secondary | ICD-10-CM

## 2014-02-07 LAB — CBC
HCT: 22.7 % — ABNORMAL LOW (ref 39.0–52.0)
HEMOGLOBIN: 7.5 g/dL — AB (ref 13.0–17.0)
MCH: 32.5 pg (ref 26.0–34.0)
MCHC: 33 g/dL (ref 30.0–36.0)
MCV: 98.3 fL (ref 78.0–100.0)
PLATELETS: 62 10*3/uL — AB (ref 150–400)
RBC: 2.31 MIL/uL — ABNORMAL LOW (ref 4.22–5.81)
RDW: 21.6 % — AB (ref 11.5–15.5)
WBC: 3.6 10*3/uL — ABNORMAL LOW (ref 4.0–10.5)

## 2014-02-07 LAB — BASIC METABOLIC PANEL
BUN: 38 mg/dL — ABNORMAL HIGH (ref 6–23)
CO2: 25 mEq/L (ref 19–32)
CREATININE: 2.22 mg/dL — AB (ref 0.50–1.35)
Calcium: 9.1 mg/dL (ref 8.4–10.5)
Chloride: 100 mEq/L (ref 96–112)
GFR calc Af Amer: 30 mL/min — ABNORMAL LOW (ref >90)
GFR, EST NON AFRICAN AMERICAN: 26 mL/min — AB (ref >90)
Glucose, Bld: 125 mg/dL — ABNORMAL HIGH (ref 70–99)
POTASSIUM: 4.5 meq/L (ref 3.7–5.3)
Sodium: 138 mEq/L (ref 137–147)

## 2014-02-11 ENCOUNTER — Other Ambulatory Visit (HOSPITAL_COMMUNITY): Payer: Self-pay | Admitting: Oncology

## 2014-02-11 DIAGNOSIS — D46C Myelodysplastic syndrome with isolated del(5q) chromosomal abnormality: Secondary | ICD-10-CM

## 2014-02-12 ENCOUNTER — Other Ambulatory Visit: Payer: Self-pay

## 2014-02-12 ENCOUNTER — Inpatient Hospital Stay (HOSPITAL_COMMUNITY)
Admission: EM | Admit: 2014-02-12 | Discharge: 2014-02-15 | DRG: 811 | Disposition: A | Discharge status: Home Health | Payer: Medicare Other | Attending: Pulmonary Disease | Admitting: Pulmonary Disease

## 2014-02-12 ENCOUNTER — Encounter (HOSPITAL_COMMUNITY): Payer: Self-pay | Admitting: Emergency Medicine

## 2014-02-12 ENCOUNTER — Other Ambulatory Visit (HOSPITAL_COMMUNITY): Payer: Medicare Other

## 2014-02-12 ENCOUNTER — Emergency Department (HOSPITAL_COMMUNITY): Payer: Medicare Other

## 2014-02-12 DIAGNOSIS — I350 Nonrheumatic aortic (valve) stenosis: Secondary | ICD-10-CM

## 2014-02-12 DIAGNOSIS — E785 Hyperlipidemia, unspecified: Secondary | ICD-10-CM

## 2014-02-12 DIAGNOSIS — D649 Anemia, unspecified: Secondary | ICD-10-CM | POA: Diagnosis present

## 2014-02-12 DIAGNOSIS — K219 Gastro-esophageal reflux disease without esophagitis: Secondary | ICD-10-CM | POA: Diagnosis present

## 2014-02-12 DIAGNOSIS — I4891 Unspecified atrial fibrillation: Secondary | ICD-10-CM | POA: Diagnosis present

## 2014-02-12 DIAGNOSIS — D61818 Other pancytopenia: Secondary | ICD-10-CM | POA: Diagnosis present

## 2014-02-12 DIAGNOSIS — R0789 Other chest pain: Secondary | ICD-10-CM

## 2014-02-12 DIAGNOSIS — I5033 Acute on chronic diastolic (congestive) heart failure: Secondary | ICD-10-CM

## 2014-02-12 DIAGNOSIS — I129 Hypertensive chronic kidney disease with stage 1 through stage 4 chronic kidney disease, or unspecified chronic kidney disease: Secondary | ICD-10-CM | POA: Diagnosis present

## 2014-02-12 DIAGNOSIS — I509 Heart failure, unspecified: Secondary | ICD-10-CM | POA: Diagnosis present

## 2014-02-12 DIAGNOSIS — M129 Arthropathy, unspecified: Secondary | ICD-10-CM | POA: Diagnosis present

## 2014-02-12 DIAGNOSIS — I6529 Occlusion and stenosis of unspecified carotid artery: Secondary | ICD-10-CM

## 2014-02-12 DIAGNOSIS — N183 Chronic kidney disease, stage 3 unspecified: Secondary | ICD-10-CM | POA: Diagnosis present

## 2014-02-12 DIAGNOSIS — R079 Chest pain, unspecified: Secondary | ICD-10-CM

## 2014-02-12 DIAGNOSIS — IMO0002 Reserved for concepts with insufficient information to code with codable children: Secondary | ICD-10-CM

## 2014-02-12 DIAGNOSIS — E1149 Type 2 diabetes mellitus with other diabetic neurological complication: Secondary | ICD-10-CM | POA: Diagnosis present

## 2014-02-12 DIAGNOSIS — I739 Peripheral vascular disease, unspecified: Secondary | ICD-10-CM | POA: Diagnosis present

## 2014-02-12 DIAGNOSIS — J449 Chronic obstructive pulmonary disease, unspecified: Secondary | ICD-10-CM | POA: Diagnosis present

## 2014-02-12 DIAGNOSIS — I5032 Chronic diastolic (congestive) heart failure: Secondary | ICD-10-CM | POA: Diagnosis present

## 2014-02-12 DIAGNOSIS — E782 Mixed hyperlipidemia: Secondary | ICD-10-CM | POA: Diagnosis present

## 2014-02-12 DIAGNOSIS — G9341 Metabolic encephalopathy: Secondary | ICD-10-CM | POA: Diagnosis present

## 2014-02-12 DIAGNOSIS — I2510 Atherosclerotic heart disease of native coronary artery without angina pectoris: Secondary | ICD-10-CM | POA: Diagnosis present

## 2014-02-12 DIAGNOSIS — I252 Old myocardial infarction: Secondary | ICD-10-CM

## 2014-02-12 DIAGNOSIS — D469 Myelodysplastic syndrome, unspecified: Principal | ICD-10-CM | POA: Diagnosis present

## 2014-02-12 DIAGNOSIS — D46C Myelodysplastic syndrome with isolated del(5q) chromosomal abnormality: Secondary | ICD-10-CM | POA: Diagnosis present

## 2014-02-12 DIAGNOSIS — K5900 Constipation, unspecified: Secondary | ICD-10-CM | POA: Diagnosis present

## 2014-02-12 DIAGNOSIS — I428 Other cardiomyopathies: Secondary | ICD-10-CM | POA: Diagnosis present

## 2014-02-12 DIAGNOSIS — Z87891 Personal history of nicotine dependence: Secondary | ICD-10-CM

## 2014-02-12 DIAGNOSIS — E1142 Type 2 diabetes mellitus with diabetic polyneuropathy: Secondary | ICD-10-CM | POA: Diagnosis present

## 2014-02-12 DIAGNOSIS — R0609 Other forms of dyspnea: Secondary | ICD-10-CM

## 2014-02-12 DIAGNOSIS — Z7982 Long term (current) use of aspirin: Secondary | ICD-10-CM

## 2014-02-12 DIAGNOSIS — Z951 Presence of aortocoronary bypass graft: Secondary | ICD-10-CM

## 2014-02-12 DIAGNOSIS — J4489 Other specified chronic obstructive pulmonary disease: Secondary | ICD-10-CM | POA: Diagnosis present

## 2014-02-12 DIAGNOSIS — Z8249 Family history of ischemic heart disease and other diseases of the circulatory system: Secondary | ICD-10-CM

## 2014-02-12 DIAGNOSIS — N1832 Chronic kidney disease, stage 3b: Secondary | ICD-10-CM

## 2014-02-12 LAB — URINALYSIS, ROUTINE W REFLEX MICROSCOPIC
BILIRUBIN URINE: NEGATIVE
GLUCOSE, UA: 100 mg/dL — AB
Hgb urine dipstick: NEGATIVE
KETONES UR: NEGATIVE mg/dL
LEUKOCYTES UA: NEGATIVE
Nitrite: NEGATIVE
Specific Gravity, Urine: 1.025 (ref 1.005–1.030)
Urobilinogen, UA: 0.2 mg/dL (ref 0.0–1.0)
pH: 5.5 (ref 5.0–8.0)

## 2014-02-12 LAB — CBC WITH DIFFERENTIAL/PLATELET
BASOS ABS: 0.1 10*3/uL (ref 0.0–0.1)
Basophils Relative: 5 % — ABNORMAL HIGH (ref 0–1)
Eosinophils Absolute: 0.3 10*3/uL (ref 0.0–0.7)
Eosinophils Relative: 10 % — ABNORMAL HIGH (ref 0–5)
HCT: 21.8 % — ABNORMAL LOW (ref 39.0–52.0)
Hemoglobin: 6.8 g/dL — CL (ref 13.0–17.0)
LYMPHS ABS: 1.1 10*3/uL (ref 0.7–4.0)
LYMPHS PCT: 38 % (ref 12–46)
MCH: 31.9 pg (ref 26.0–34.0)
MCHC: 31.2 g/dL (ref 30.0–36.0)
MCV: 102.3 fL — ABNORMAL HIGH (ref 78.0–100.0)
Monocytes Absolute: 0.1 10*3/uL (ref 0.1–1.0)
Monocytes Relative: 5 % (ref 3–12)
NEUTROS ABS: 1.2 10*3/uL — AB (ref 1.7–7.7)
NEUTROS PCT: 43 % (ref 43–77)
PLATELETS: 36 10*3/uL — AB (ref 150–400)
RBC: 2.13 MIL/uL — AB (ref 4.22–5.81)
RDW: 22.6 % — ABNORMAL HIGH (ref 11.5–15.5)
SMEAR REVIEW: DECREASED
WBC: 2.9 10*3/uL — ABNORMAL LOW (ref 4.0–10.5)

## 2014-02-12 LAB — BASIC METABOLIC PANEL
BUN: 35 mg/dL — ABNORMAL HIGH (ref 6–23)
CHLORIDE: 107 meq/L (ref 96–112)
CO2: 21 mEq/L (ref 19–32)
Calcium: 8.4 mg/dL (ref 8.4–10.5)
Creatinine, Ser: 2.2 mg/dL — ABNORMAL HIGH (ref 0.50–1.35)
GFR calc non Af Amer: 27 mL/min — ABNORMAL LOW (ref >90)
GFR, EST AFRICAN AMERICAN: 31 mL/min — AB (ref >90)
Glucose, Bld: 157 mg/dL — ABNORMAL HIGH (ref 70–99)
Potassium: 4.2 mEq/L (ref 3.7–5.3)
SODIUM: 143 meq/L (ref 137–147)

## 2014-02-12 LAB — PREPARE RBC (CROSSMATCH)

## 2014-02-12 LAB — URINE MICROSCOPIC-ADD ON

## 2014-02-12 LAB — TROPONIN I

## 2014-02-12 LAB — PROTIME-INR
INR: 1.21 (ref 0.00–1.49)
PROTHROMBIN TIME: 15 s (ref 11.6–15.2)

## 2014-02-12 LAB — PRO B NATRIURETIC PEPTIDE: PRO B NATRI PEPTIDE: 9708 pg/mL — AB (ref 0–450)

## 2014-02-12 LAB — MRSA PCR SCREENING: MRSA by PCR: POSITIVE — AB

## 2014-02-12 MED ORDER — AMIODARONE HCL 200 MG PO TABS
100.0000 mg | ORAL_TABLET | Freq: Every day | ORAL | Status: DC
  Administered 2014-02-12 – 2014-02-14 (×3): 100 mg via ORAL
  Filled 2014-02-12 (×4): qty 1

## 2014-02-12 MED ORDER — NITROGLYCERIN 0.4 MG SL SUBL
0.4000 mg | SUBLINGUAL_TABLET | SUBLINGUAL | Status: DC | PRN
  Filled 2014-02-12: qty 1

## 2014-02-12 MED ORDER — FUROSEMIDE 40 MG PO TABS
40.0000 mg | ORAL_TABLET | Freq: Every day | ORAL | Status: DC
  Administered 2014-02-12 – 2014-02-15 (×4): 40 mg via ORAL
  Filled 2014-02-12 (×4): qty 1

## 2014-02-12 MED ORDER — CHLORHEXIDINE GLUCONATE CLOTH 2 % EX PADS
6.0000 | MEDICATED_PAD | Freq: Every day | CUTANEOUS | Status: DC
  Administered 2014-02-13 – 2014-02-15 (×3): 6 via TOPICAL

## 2014-02-12 MED ORDER — METOPROLOL TARTRATE 25 MG PO TABS
25.0000 mg | ORAL_TABLET | Freq: Two times a day (BID) | ORAL | Status: DC
  Administered 2014-02-12 – 2014-02-15 (×7): 25 mg via ORAL
  Filled 2014-02-12 (×7): qty 1

## 2014-02-12 MED ORDER — AMLODIPINE BESYLATE 5 MG PO TABS
5.0000 mg | ORAL_TABLET | Freq: Every day | ORAL | Status: DC
  Administered 2014-02-13 – 2014-02-15 (×3): 5 mg via ORAL
  Filled 2014-02-12 (×4): qty 1

## 2014-02-12 MED ORDER — AMITRIPTYLINE HCL 10 MG PO TABS
30.0000 mg | ORAL_TABLET | Freq: Every day | ORAL | Status: DC
  Administered 2014-02-12 – 2014-02-14 (×3): 30 mg via ORAL
  Filled 2014-02-12 (×3): qty 3

## 2014-02-12 MED ORDER — ACETAMINOPHEN 500 MG PO TABS: 500.0000 mg | ORAL_TABLET | Freq: Four times a day (QID) | ORAL | Status: DC | PRN

## 2014-02-12 MED ORDER — SODIUM CHLORIDE 0.9 % IV SOLN
INTRAVENOUS | Status: DC
  Administered 2014-02-12: 11:00:00 via INTRAVENOUS

## 2014-02-12 MED ORDER — TAMSULOSIN HCL 0.4 MG PO CAPS
0.4000 mg | ORAL_CAPSULE | Freq: Every day | ORAL | Status: DC
Start: 2014-02-12 — End: 2014-02-15
  Administered 2014-02-12 – 2014-02-15 (×4): 0.4 mg via ORAL
  Filled 2014-02-12 (×4): qty 1

## 2014-02-12 MED ORDER — BIOTENE DRY MOUTH MT LIQD
15.0000 mL | Freq: Two times a day (BID) | OROMUCOSAL | Status: DC
  Administered 2014-02-12 – 2014-02-15 (×5): 15 mL via OROMUCOSAL

## 2014-02-12 MED ORDER — MUPIROCIN 2 % EX OINT
1.0000 "application " | TOPICAL_OINTMENT | Freq: Two times a day (BID) | CUTANEOUS | Status: DC
  Administered 2014-02-12 – 2014-02-15 (×6): 1 via NASAL
  Filled 2014-02-12 (×3): qty 22

## 2014-02-12 MED ORDER — ISOSORBIDE MONONITRATE ER 60 MG PO TB24
30.0000 mg | ORAL_TABLET | Freq: Every day | ORAL | Status: DC
  Administered 2014-02-12 – 2014-02-14 (×3): 30 mg via ORAL
  Filled 2014-02-12 (×3): qty 1

## 2014-02-12 MED ORDER — PREGABALIN 75 MG PO CAPS
75.0000 mg | ORAL_CAPSULE | Freq: Every day | ORAL | Status: DC
  Administered 2014-02-12 – 2014-02-15 (×4): 75 mg via ORAL
  Filled 2014-02-12 (×4): qty 1

## 2014-02-12 MED ORDER — ASPIRIN 325 MG PO TABS
325.0000 mg | ORAL_TABLET | Freq: Every day | ORAL | Status: DC
  Administered 2014-02-13 – 2014-02-15 (×3): 325 mg via ORAL
  Filled 2014-02-12 (×3): qty 1

## 2014-02-12 MED ORDER — DIAZEPAM 5 MG PO TABS: 5.0000 mg | ORAL_TABLET | Freq: Four times a day (QID) | ORAL | Status: DC | PRN

## 2014-02-12 MED ORDER — SODIUM CHLORIDE 0.9 % IV SOLN: INTRAVENOUS | Status: DC

## 2014-02-12 MED ORDER — POTASSIUM CHLORIDE CRYS ER 20 MEQ PO TBCR
20.0000 meq | EXTENDED_RELEASE_TABLET | Freq: Every day | ORAL | Status: DC
  Administered 2014-02-12 – 2014-02-15 (×4): 20 meq via ORAL
  Filled 2014-02-12 (×2): qty 1
  Filled 2014-02-12: qty 2
  Filled 2014-02-12: qty 1

## 2014-02-12 MED ORDER — PREDNISONE 10 MG PO TABS
15.0000 mg | ORAL_TABLET | Freq: Every day | ORAL | Status: DC
  Administered 2014-02-13 – 2014-02-15 (×3): 15 mg via ORAL
  Filled 2014-02-12 (×3): qty 2

## 2014-02-12 MED ORDER — METFORMIN HCL 500 MG PO TABS: 500.0000 mg | ORAL_TABLET | Freq: Two times a day (BID) | ORAL | Status: DC

## 2014-02-12 MED ORDER — ALBUTEROL SULFATE (2.5 MG/3ML) 0.083% IN NEBU: 3.0000 mL | INHALATION_SOLUTION | Freq: Four times a day (QID) | RESPIRATORY_TRACT | Status: DC | PRN

## 2014-02-12 MED ORDER — HEPARIN SODIUM (PORCINE) 5000 UNIT/ML IJ SOLN
5000.0000 [IU] | Freq: Three times a day (TID) | INTRAMUSCULAR | Status: DC
  Administered 2014-02-12: 5000 [IU] via SUBCUTANEOUS
  Filled 2014-02-12 (×2): qty 1

## 2014-02-12 MED ORDER — FUROSEMIDE 10 MG/ML IJ SOLN
80.0000 mg | Freq: Once | INTRAMUSCULAR | Status: AC
  Administered 2014-02-12: 80 mg via INTRAVENOUS
  Filled 2014-02-12: qty 8

## 2014-02-12 MED ORDER — HYDROCODONE-ACETAMINOPHEN 5-325 MG PO TABS: 1.0000 | ORAL_TABLET | Freq: Four times a day (QID) | ORAL | Status: DC | PRN

## 2014-02-12 MED ORDER — ADULT MULTIVITAMIN W/MINERALS CH
1.0000 | ORAL_TABLET | Freq: Every day | ORAL | Status: DC
  Administered 2014-02-12 – 2014-02-15 (×4): 1 via ORAL
  Filled 2014-02-12 (×4): qty 1

## 2014-02-12 MED ORDER — IPRATROPIUM-ALBUTEROL 0.5-2.5 (3) MG/3ML IN SOLN
3.0000 mL | Freq: Four times a day (QID) | RESPIRATORY_TRACT | Status: DC | PRN
  Administered 2014-02-12 – 2014-02-14 (×3): 3 mL via RESPIRATORY_TRACT
  Filled 2014-02-12 (×5): qty 3

## 2014-02-12 MED ORDER — LENALIDOMIDE 5 MG PO CAPS: 5.0000 mg | ORAL_CAPSULE | Freq: Every day | ORAL | Status: DC

## 2014-02-12 NOTE — ED Notes (Signed)
MD at bedside. 

## 2014-02-12 NOTE — H&P (Signed)
ADMISSION H&P  PCP:   [name], MD   Chief Complaint:  SOB  HPI: Pleasant 80yo M w/ complex hx to include myelodysplastic syndrome who presented to the ED w/ c/o acute onset of SOB and L sided chest pressure as of 04:00 this AM.  He relates that his "blood count" has been dropping slowly over the last few weeks, and is being monitored by his Oncologist.  Coincident with this drop, he has noted progressive weakness and lethargy, which came to a climax this morning.    Upon arrival to the ED he was noted to have a Hgb of 6.8.  His EKG was w/o acute ST or T wave changes, and a CXR is w/o acute findings.  His chest pain spontaneously resolved.  At the time of my exam his SOB has also improve markedly.    Review of Systems:  The patient denies anorexia, fever, weight loss,, vision loss, decreased hearing, hoarseness, syncope, peripheral edema, balance deficits, hemoptysis, abdominal pain, melena, hematochezia, severe indigestion/heartburn, hematuria, incontinence, muscle weakness, suspicious skin lesions, transient blindness, depression, unusual weight change, abnormal bleeding, enlarged lymph nodes, or angioedema.  He does admit to 2 recent falls, one to each side, each time landing on his elbows, but relates these falls have been mechanical in nature an denying syncope.    Past Medical History: Past Medical History  Diagnosis Date  . Essential hypertension, benign   . COPD (chronic obstructive pulmonary disease)   . Coronary atherosclerosis of native coronary artery     Multivessel status post CABG 1996  . Arthritis   . Atrial fibrillation     Coumadin stopped 12/2011 due to anemia  . Dysphagia   . Anemia     Requiring blood transfusions  . Carotid stenosis     Dr Scot Dock   . Diabetes mellitus, type 2   . Mixed hyperlipidemia     Statin intolerant  . Hematuria     Felt related to Foley insertion  . Cardiomyopathy     LVEF 45-50%  . Melena     EGD & Colonoscopy 09/2011  revealed gastritis, erosions, scars, diverticula, 8 colon polyps (largest 1.6cm, not removed 2/2 anticoagulation), small AVM in transverse colon  . History of tobacco abuse     Quit 1992  . Anxiety   . GERD (gastroesophageal reflux disease)   . Myocardial infarction   . Thrombocytopenia   . Myelodysplastic syndrome with 5 q minus 10/17/2012    On Revlimid 5 mg daily 21 days on and 7 days off  . Aortic stenosis     Mild  . CKD (chronic kidney disease) stage 3, GFR 30-59 ml/min   . Pneumonia 11/2013    history of   Past Surgical History  Procedure Laterality Date  . Knee surgery  1960    Right knee cartilage  . Rotator cuff repair  1990    right   . Inguinal hernia repair  2000 and 2010    left  . Cholecystectomy  05/2010    Lap chole with biologic mesh repair/reinforcement by  Dr Rise Patience  . Back surgery  02/2006    ruptured disk,  post op diskitis infection requiring prolonged hospital stay and  surgical I & D  . Tonsillectomy    . Esophagogastroduodenoscopy  10/08/2011    Procedure: ESOPHAGOGASTRODUODENOSCOPY (EGD);  Surgeon: [name], MD;  Location: [facility];  Service: Endoscopy;  Laterality: N/A;  . Colonoscopy  10/08/2011    Procedure: COLONOSCOPY;  Surgeon: [name], MD;  Location: [facility];  Service: Endoscopy;  Laterality: N/A;  . Cataract extraction, bilateral    . Peg placement  07/2006    placed due to prolonged infection, poor po intake, malnutrition during several week hospital stay resulting from ruptured disc that became infected following surgery  . Colonoscopy  01/14/2012    Procedure: COLONOSCOPY;  Surgeon: [name], MD;  Location: [facility];  Service: Endoscopy;  Laterality: N/A;  945  . Coronary artery bypass graft  1996    CABG x 4  . Ventral hernia repair    . Colonoscopy  04/20/2012    Procedure: COLONOSCOPY;  Surgeon: [name], MD;  Location: [facility];  Service: Endoscopy;  Laterality: N/A;  1030  . Pr vein  bypass graft,aorto-fem-pop  1996  . Spine surgery    . Joint replacement  1960    Right Knee  . Lymph node biopsy  08/15/2012    Procedure: LYMPH NODE BIOPSY;  Surgeon: Scherry Ran, MD;  Location: [facility];  Service: General;  Laterality: Left;  Cervical Lymph Node Bx in Minor Room  . Bone marrow biopsy    . Bone marrow aspiration      Medications: Prior to Admission medications   Medication Sig Start Date End Date Taking? Authorizing Provider  acetaminophen (TYLENOL) 500 MG tablet Take 500 mg by mouth every 6 (six) hours as needed for moderate pain.   Yes Historical Provider, MD  albuterol (PROVENTIL HFA;VENTOLIN HFA) 108 (90 BASE) MCG/ACT inhaler Inhale 2 puffs into the lungs every 6 (six) hours as needed for wheezing. 01/08/13  Yes [name], MD  amiodarone (PACERONE) 200 MG tablet Take 0.5 tablets (100 mg total) by mouth daily. 10/08/11  Yes [name], MD  amitriptyline (ELAVIL) 10 MG tablet Take 30 mg by mouth at bedtime.   Yes Historical Provider, MD  amLODipine (NORVASC) 5 MG tablet TAKE ONE TABLET BY MOUTH EVERY DAY FOR BLOOD PRESSURE 02/26/13  Yes Fay Records, MD  aspirin 325 MG tablet Take 325 mg by mouth daily.   Yes Historical Provider, MD  diazepam (VALIUM) 5 MG tablet Take one tablet by mouth every 6 hours as needed for muscle spasms 12/10/13  Yes Tiffany L Reed, DO  furosemide (LASIX) 40 MG tablet Take 1 tablet (40 mg total) by mouth daily. Take one half tablet daily on a routine basis. If your weight goes up by 3 pounds or if you have edema of your legs take one full tablet twice a day for 3 days 01/08/13  Yes [name], MD  HYDROcodone-acetaminophen (NORCO/VICODIN) 5-325 MG per tablet Take 1 by mouth every 6 hours as needed for pain. *MAX APAP=3GM/24 HRS- ALL SOURCES* 12/20/13  Yes Tiffany L Reed, DO  insulin aspart (NOVOLOG) 100 UNIT/ML injection Inject 0-15 Units into the skin every 4 (four) hours. 12/08/13  Yes [name], MD  ipratropium-albuterol  (DUONEB) 0.5-2.5 (3) MG/3ML SOLN Take 3 mLs by nebulization every 6 (six) hours as needed (shortness of breath). 12/08/13  Yes [name], MD  isosorbide mononitrate (IMDUR) 30 MG 24 hr tablet Take 1 tablet (30 mg total) by mouth daily. 12/08/13  Yes [name], MD  lenalidomide (REVLIMID) 5 MG capsule Take 1 capsule (5 mg total) by mouth daily. 01/08/14  Yes Baird Cancer, PA-C  metFORMIN (GLUCOPHAGE) 500 MG tablet Take 500 mg by mouth 2 (two) times daily with a meal.  Yes Historical Provider, MD  metoprolol tartrate (LOPRESSOR) 25 MG tablet Take 25 mg by mouth 2 (two) times daily.     Yes Historical Provider, MD  Multiple Vitamin (MULTIVITAMIN) tablet Take 1 tablet by mouth daily.     Yes Historical Provider, MD  nitroGLYCERIN (NITROSTAT) 0.4 MG SL tablet Place 1 tablet (0.4 mg total) under the tongue every 5 (five) minutes x 3 doses as needed for chest pain (up to 3 doses). 01/08/13  Yes [name], MD  potassium chloride SA (K-DUR,KLOR-CON) 20 MEQ tablet Take 1 tablet (20 mEq total) by mouth daily. 12/08/13  Yes [name], MD  predniSONE (DELTASONE) 20 MG tablet Take 15 mg by mouth daily with breakfast. 12/08/13  Yes [name], MD  pregabalin (LYRICA) 75 MG capsule Take 1 capsule (75 mg total) by mouth daily. 12/18/13  Yes Estill Dooms, MD  Red Yeast Rice 600 MG TABS Take 1 tablet by mouth daily.     Yes Historical Provider, MD  silodosin (RAPAFLO) 8 MG CAPS capsule Take 8 mg by mouth daily with breakfast.     Yes Historical Provider, MD   Allergies:   Allergies  Allergen Reactions  . Statins Other (See Comments)    Causes legs to hurt.   . Tape     Causes skin to peel off.     Social History:  reports that he quit smoking about 23 years ago. His smoking use included Cigarettes. He has a 80 pack-year smoking history. He has never used smokeless tobacco. He reports that he does not drink alcohol or use illicit drugs. The patient lives with his wife of 77  years [place].  He is normally able to ambulate independently.    Family History: Family History  Problem Relation Age of Onset  . Heart disease Sister   . Cancer Sister   . Hyperlipidemia Sister   . Hypertension Sister   . Heart disease Brother     Heart Diseast before age 56  . Coronary artery disease Mother   . Cancer Father     Stomach  . Anesthesia problems Neg Hx   . Hypotension Neg Hx   . Malignant hyperthermia Neg Hx   . Pseudochol deficiency Neg Hx     Physical Exam: Filed Vitals:   02/12/14 0830 02/12/14 0900 02/12/14 1006 02/12/14 1030  BP: 119/50 117/47 118/59 104/53  Pulse: 70 71 58 57  Temp: 98.2 F (36.8 C)     Resp: $Remo'18 19 18 15  'ZqfkR$ Height: 5' 11.5" (1.816 m)     Weight: 84.823 kg (187 lb)     SpO2: 98% 98% 98% 97%   General: No acute respiratory distress when laying on stretcher  HEENT: Normocephalic,atraumatic, pupils equal round reactive to light and accommodation, extraocular muscles intact bilaterally, OC/OP clear, sclera nonicteric Neck: No JVD or thyromegaly Lungs: Clear to auscultation bilaterally without wheezes or rhonchi, with good air movement throughout all fields Cardiovascular: Regular rate and rhythm without gallop or rub normal S1 and S2 - 2/6 holosystolic M appreciated  Abdomen: Nontender, nondistended, soft, bowel sounds present, no organomegaly, no rebound, no ascites Extremities: No significant cyanosis, clubbing, edema bilateral lower extremities Neurologic: Alert and oriented x4, cranial nerves II through XII intact bilaterally, 5 over 5 strength bilateral upper and lower studies, no Babinski, intact to sensation of touch throughout  Labs on Admission:   Recent Labs  02/12/14 0908  NA 143  K 4.2  CL 107  CO2 21  GLUCOSE 157*  BUN 35*  CREATININE 2.20*  CALCIUM 8.4   No results found for this basename: AST, ALT, ALKPHOS, BILITOT, PROT, ALBUMIN,  in the last 72 hours No results found for this basename: LIPASE,  AMYLASE,  in the last 72 hours  Recent Labs  02/12/14 0908  WBC 2.9*  NEUTROABS 1.2*  HGB 6.8*  HCT 21.8*  MCV 102.3*  PLT 36*    Recent Labs  02/12/14 0908  TROPONINI <0.30   Radiological Exams on Admission:  Dg Chest Portable 1 View  02/12/2014   FINDINGS: The lungs are well-expanded. The pulmonary interstitial markings remain increased. The pulmonary vascularity remains engorged but is not as prominent as on the earlier study. The cardiopericardial silhouette is top-normal in size. The mediastinum is not abnormally widened. There are post median sternotomy changes present with evidence of previous CABG.  IMPRESSION: Findings are consistent with congestive heart failure with pulmonary interstitial edema. The pulmonary vascular congestion is not quite as severe as on the previous study.   Electronically Signed   By: David  Martinique   On: 02/12/2014 09:04    Assessment/Plan:  SOB/dyspnea - multifactorial Baseline COPD + heart disease acutely complicated by symptomatic anemia - primary tx will be transfusion of PRBC (see below)  Acute on chronic anemia - Myelodysplastic syndrome w/ chronic pancytopenia Followed at [facility] - on Revlimid - Hgb 8.6 01/30/14 - 7.5 5/14/2-15 - now 6.8 at presentation - given hx of CAD desire Hgb of 8.0 or > - will transfuse a total of 2 U PRBC for now and follow trend - no hx to suggest active bleeding at present  Chest pain troponin negative thus far - cycle x3 sets - suspect cp related to anemia and resultant undue cardiac stress - recheck 12 lead EKG in AM  Chronic kidney disease  baseline crt appears to be 2.2-2.4 / GFR 30 - no acute renal insult at this time   Chronic Afib  Not on anticoag due to hx of anemia - cont amiodarone   COPD well compensated at present - suspect majority of sob related to anemia and cardiac stress / poor oxygen delivery   HTN BP well controlled   CAD s/p CABG '96 Transfuse to goal Hgb of 8.0 - cycle enzymes  as noted above   DM2 Follow CBGs   GERD No acute complaints at present  Dr. Luan Pulling to assume care in AM 02/13/14.  Time spent on this patient including examination and decision-making process: 60+ minutes.  Cherene Altes, MD [facility] Office  [phone] Pager [phone]  On-Call/Text Page:      [email or web address]      password [facility]  02/12/2014, 10:56 AM

## 2014-02-12 NOTE — Progress Notes (Signed)
Platelets low, heparin sq d/c'd as VTE. Using SCDs.

## 2014-02-12 NOTE — ED Notes (Signed)
Critical Value hbg-6.8, plt-36. Reported to [place].

## 2014-02-12 NOTE — ED Notes (Addendum)
Pt c/o chest pressure upon waking this am with some sob. cbg-170 per EMS. Pt states he took 324mg  ASA and 2 sl nitro pta.

## 2014-02-12 NOTE — ED Provider Notes (Addendum)
CSN: [id number]     Arrival date & time 02/12/14  2706 History  This chart was scribed for Caleb Kung, MD by Ladene Artist, ED Scribe. The patient was seen in room APA09/APA09. Patient's care was started at 9:25 AM.    Chief Complaint  Patient presents with  . Chest Pain   Patient is a 80 y.o. male presenting with chest pain. The history is provided by the patient. No language interpreter was used.  Chest Pain Pain location:  L chest Pain quality: pressure   Pain radiates to:  Does not radiate Pain radiates to the back: no   Pain severity:  Mild Onset quality:  Sudden Duration:  5 hours Chronicity:  New Associated symptoms: shortness of breath   Associated symptoms: no abdominal pain, no cough, no fever, no headache, no nausea and not vomiting    HPI Comments: [name] is a 80 y.o. male, with a h/o CHF and pneumonia, who presents to the Emergency Department complaining of L sided chest pain upon waking at 4:30 this morning with associated SOB. He currently rates his chest pain 4/10. Per EMS, CBG was 170. Pt tried Nitro sprays with mild relief. Pt is still taking Lasix. Pt denies wearing O2 at home. Pt also reports fall 5 days ago in the parking lot and notes abrasions to bilateral arms. He was not seen for fall. Pt states that his hemoglobin was 8 yesterday and was supposed to follow-up today with PCP. Hemoglobin was 7.5 on 02/07/14 and 6.8 today.  PCP: [name]   Past Medical History  Diagnosis Date  . Essential hypertension, benign   . COPD (chronic obstructive pulmonary disease)   . Coronary atherosclerosis of native coronary artery     Multivessel status post CABG 1996  . Arthritis   . Atrial fibrillation     Coumadin stopped 12/2011 due to anemia  . Dysphagia   . Anemia     Requiring blood transfusions  . Carotid stenosis     Dr Scot Dock   . Diabetes mellitus, type 2   . Mixed hyperlipidemia     Statin intolerant  . Hematuria     Felt related to  Foley insertion  . Cardiomyopathy     LVEF 45-50%  . Melena     EGD & Colonoscopy 09/2011 revealed gastritis, erosions, scars, diverticula, 8 colon polyps (largest 1.6cm, not removed 2/2 anticoagulation), small AVM in transverse colon  . History of tobacco abuse     Quit 1992  . Anxiety   . GERD (gastroesophageal reflux disease)   . Myocardial infarction   . Thrombocytopenia   . Myelodysplastic syndrome with 5 q minus 10/17/2012    On Revlimid 5 mg daily 21 days on and 7 days off  . Aortic stenosis     Mild  . CKD (chronic kidney disease) stage 3, GFR 30-59 ml/min   . Pneumonia 11/2013    history of   Past Surgical History  Procedure Laterality Date  . Knee surgery  1960    Right knee cartilage  . Rotator cuff repair  1990    right   . Inguinal hernia repair  2000 and 2010    left  . Cholecystectomy  05/2010    Lap chole with biologic mesh repair/reinforcement by  Dr Rise Patience  . Back surgery  02/2006    ruptured disk,  post op diskitis infection requiring prolonged hospital stay and  surgical I & D  . Tonsillectomy    .  Esophagogastroduodenoscopy  10/08/2011    Procedure: ESOPHAGOGASTRODUODENOSCOPY (EGD);  Surgeon: Caleb Houston, MD;  Location: [facility];  Service: Endoscopy;  Laterality: N/A;  . Colonoscopy  10/08/2011    Procedure: COLONOSCOPY;  Surgeon: Caleb Houston, MD;  Location: [facility];  Service: Endoscopy;  Laterality: N/A;  . Cataract extraction, bilateral    . Peg placement  07/2006    placed due to prolonged infection, poor po intake, malnutrition during several week hospital stay resulting from ruptured disc that became infected following surgery  . Colonoscopy  01/14/2012    Procedure: COLONOSCOPY;  Surgeon: Caleb Houston, MD;  Location: [facility];  Service: Endoscopy;  Laterality: N/A;  945  . Coronary artery bypass graft  1996    CABG x 4  . Ventral hernia repair    . Colonoscopy  04/20/2012    Procedure: COLONOSCOPY;  Surgeon: Caleb Houston,  MD;  Location: [facility];  Service: Endoscopy;  Laterality: N/A;  1030  . Pr vein bypass graft,aorto-fem-pop  1996  . Spine surgery    . Joint replacement  1960    Right Knee  . Lymph node biopsy  08/15/2012    Procedure: LYMPH NODE BIOPSY;  Surgeon: Scherry Ran, MD;  Location: [facility];  Service: General;  Laterality: Left;  Cervical Lymph Node Bx in Minor Room  . Bone marrow biopsy    . Bone marrow aspiration     Family History  Problem Relation Age of Onset  . Heart disease Sister   . Cancer Sister   . Hyperlipidemia Sister   . Hypertension Sister   . Heart disease Brother     Heart Diseast before age 65  . Coronary artery disease Mother   . Cancer Father     Stomach  . Anesthesia problems Neg Hx   . Hypotension Neg Hx   . Malignant hyperthermia Neg Hx   . Pseudochol deficiency Neg Hx    History  Substance Use Topics  . Smoking status: Former Smoker -- 2.00 packs/day for 40 years    Types: Cigarettes    Quit date: 09/27/1990  . Smokeless tobacco: Never Used  . Alcohol Use: No    Review of Systems  Constitutional: Negative for fever and chills.  HENT: Negative for rhinorrhea and sore throat.   Eyes: Positive for visual disturbance (blurred vision).  Respiratory: Positive for shortness of breath. Negative for cough.   Cardiovascular: Positive for chest pain and leg swelling (intermittent).  Gastrointestinal: Negative for nausea, vomiting, abdominal pain and diarrhea.  Genitourinary: Negative for dysuria.  Skin: Positive for wound. Negative for rash.  Neurological: Negative for headaches.  Hematological: Does not bruise/bleed easily.  Psychiatric/Behavioral: Negative for confusion.  All other systems reviewed and are negative.  Allergies  Statins and Tape  Home Medications   Prior to Admission medications   Medication Sig Start Date End Date Taking? Authorizing Provider  acetaminophen (TYLENOL) 500 MG tablet Take 1 tablet (500 mg total) by mouth every  6 (six) hours as needed. Pain 01/15/12   Caleb Houston, MD  albuterol (PROVENTIL HFA;VENTOLIN HFA) 108 (90 BASE) MCG/ACT inhaler Inhale 2 puffs into the lungs every 6 (six) hours as needed for wheezing. 01/08/13   [name], MD  amiodarone (PACERONE) 200 MG tablet Take 0.5 tablets (100 mg total) by mouth daily. 10/08/11   [name], MD  amitriptyline (ELAVIL) 10 MG tablet Take 30 mg by mouth at bedtime.  Historical Provider, MD  amLODipine (NORVASC) 5 MG tablet TAKE ONE TABLET BY MOUTH EVERY DAY FOR BLOOD PRESSURE 02/26/13   Fay Records, MD  aspirin EC 81 MG tablet Take 81 mg by mouth 2 (two) times daily.     Historical Provider, MD  bisacodyl (DULCOLAX) 5 MG EC tablet Take 1 tablet (5 mg total) by mouth daily as needed for moderate constipation. 12/08/13   [name], MD  diazepam (VALIUM) 5 MG tablet Take one tablet by mouth every 6 hours as needed for muscle spasms 12/10/13   Tiffany L Reed, DO  furosemide (LASIX) 40 MG tablet Take 1 tablet (40 mg total) by mouth daily. Take one half tablet daily on a routine basis. If your weight goes up by 3 pounds or if you have edema of your legs take one full tablet twice a day for 3 days 01/08/13 01/30/14  [name], MD  HYDROcodone-acetaminophen (NORCO/VICODIN) 5-325 MG per tablet Take 1 by mouth every 6 hours as needed for pain. *MAX APAP=3GM/24 HRS- ALL SOURCES* 12/20/13   Tiffany L Reed, DO  insulin aspart (NOVOLOG) 100 UNIT/ML injection Inject 0-15 Units into the skin every 4 (four) hours. 12/08/13   [name], MD  ipratropium-albuterol (DUONEB) 0.5-2.5 (3) MG/3ML SOLN Take 3 mLs by nebulization every 6 (six) hours as needed (shortness of breath). 12/08/13   [name], MD  isosorbide mononitrate (IMDUR) 30 MG 24 hr tablet Take 1 tablet (30 mg total) by mouth daily. 12/08/13   [name], MD  lenalidomide (REVLIMID) 5 MG capsule Take 1 capsule (5 mg total) by mouth daily. 01/08/14   Baird Cancer, PA-C  METFORMIN HCL  PO Take 1 tablet by mouth 2 (two) times daily.    Historical Provider, MD  metoprolol tartrate (LOPRESSOR) 25 MG tablet Take 25 mg by mouth 2 (two) times daily.      Historical Provider, MD  Multiple Vitamin (MULTIVITAMIN) tablet Take 1 tablet by mouth daily.      Historical Provider, MD  nitroGLYCERIN (NITROSTAT) 0.4 MG SL tablet Place 1 tablet (0.4 mg total) under the tongue every 5 (five) minutes x 3 doses as needed for chest pain (up to 3 doses). 01/08/13 01/30/14  [name], MD  polyethylene glycol [facility] Floria Raveling) packet Take 17 g by mouth daily as needed for mild constipation or moderate constipation. 12/08/13   [name], MD  potassium chloride SA (K-DUR,KLOR-CON) 20 MEQ tablet Take 1 tablet (20 mEq total) by mouth daily. 12/08/13   [name], MD  predniSONE (DELTASONE) 20 MG tablet Take 15 mg by mouth daily with breakfast. 12/08/13   [name], MD  pregabalin (LYRICA) 75 MG capsule Take 1 capsule (75 mg total) by mouth daily. 12/18/13   Estill Dooms, MD  Red Yeast Rice 600 MG TABS Take 1 tablet by mouth daily.      Historical Provider, MD  silodosin (RAPAFLO) 8 MG CAPS capsule Take 8 mg by mouth daily with breakfast.      Historical Provider, MD   Triage Vitals: BP 119/50  Pulse 70  Temp(Src) 98.2 F (36.8 C)  Resp 18  Ht 5' 11.5" (1.816 m)  Wt 187 lb (84.823 kg)  BMI 25.72 kg/m2  SpO2 98% Physical Exam  Nursing note and vitals reviewed. Constitutional: He is oriented to person, place, and time. He appears well-developed and well-nourished. No distress.  HENT:  Head: Normocephalic and atraumatic.  Eyes: EOM  are normal.  Neck: Neck supple. No tracheal deviation present.  Cardiovascular: Normal rate and regular rhythm.   Pulmonary/Chest: Effort normal. No respiratory distress. He has rales (bilateral).  Musculoskeletal: Normal range of motion. He exhibits no edema.  Neurological: He is alert and oriented to person, place, and time. No cranial nerve  deficit. He exhibits normal muscle tone. Coordination normal.  Skin: Skin is warm and dry.  Psychiatric: He has a normal mood and affect. His behavior is normal.    ED Course  Procedures (including critical care time) DIAGNOSTIC STUDIES: Oxygen Saturation is 98% on Corn, normal by my interpretation.    COORDINATION OF CARE: 9:32 AM-Discussed treatment plan which includes EKG and diagnostic labwork with pt at bedside and pt agreed to plan.   Labs Review Labs Reviewed  CBC WITH DIFFERENTIAL - Abnormal; Notable for the following:    WBC 2.9 (*)    RBC 2.13 (*)    Hemoglobin 6.8 (*)    HCT 21.8 (*)    MCV 102.3 (*)    RDW 22.6 (*)    Platelets 36 (*)    Neutro Abs 1.2 (*)    Eosinophils Relative 10 (*)    Basophils Relative 5 (*)    All other components within normal limits  BASIC METABOLIC PANEL - Abnormal; Notable for the following:    Glucose, Bld 157 (*)    BUN 35 (*)    Creatinine, Ser 2.20 (*)    GFR calc non Af Amer 27 (*)    GFR calc Af Amer 31 (*)    All other components within normal limits  URINALYSIS, ROUTINE W REFLEX MICROSCOPIC - Abnormal; Notable for the following:    Glucose, UA 100 (*)    Protein, ur TRACE (*)    All other components within normal limits  TROPONIN I  URINE MICROSCOPIC-ADD ON  PROTIME-INR  PRO B NATRIURETIC PEPTIDE  TYPE AND SCREEN  PREPARE RBC (CROSSMATCH)   Results for orders placed during the hospital encounter of 02/12/14  CBC WITH DIFFERENTIAL      Result Value Ref Range   WBC 2.9 (*) 4.0 - 10.5 K/uL   RBC 2.13 (*) 4.22 - 5.81 MIL/uL   Hemoglobin 6.8 (*) 13.0 - 17.0 g/dL   HCT 63.2 (*) 82.8 - 96.1 %   MCV 102.3 (*) 78.0 - 100.0 fL   MCH 31.9  26.0 - 34.0 pg   MCHC 31.2  30.0 - 36.0 g/dL   RDW 03.5 (*) 11.7 - 53.0 %   Platelets 36 (*) 150 - 400 K/uL   Neutrophils Relative % 43  43 - 77 %   Neutro Abs 1.2 (*) 1.7 - 7.7 K/uL   Lymphocytes Relative 38  12 - 46 %   Lymphs Abs 1.1  0.7 - 4.0 K/uL   Monocytes Relative 5  3 - 12 %    Monocytes Absolute 0.1  0.1 - 1.0 K/uL   Eosinophils Relative 10 (*) 0 - 5 %   Eosinophils Absolute 0.3  0.0 - 0.7 K/uL   Basophils Relative 5 (*) 0 - 1 %   Basophils Absolute 0.1  0.0 - 0.1 K/uL   Smear Review PLATELETS APPEAR DECREASED    BASIC METABOLIC PANEL      Result Value Ref Range   Sodium 143  137 - 147 mEq/L   Potassium 4.2  3.7 - 5.3 mEq/L   Chloride 107  96 - 112 mEq/L   CO2 21  19 - 32 mEq/L  Glucose, Bld 157 (*) 70 - 99 mg/dL   BUN 35 (*) 6 - 23 mg/dL   Creatinine, Ser 5.05 (*) 0.50 - 1.35 mg/dL   Calcium 8.4  8.4 - 67.8 mg/dL   GFR calc non Af Amer 27 (*) >90 mL/min   GFR calc Af Amer 31 (*) >90 mL/min  TROPONIN I      Result Value Ref Range   Troponin I <0.30  <0.30 ng/mL  URINALYSIS, ROUTINE W REFLEX MICROSCOPIC      Result Value Ref Range   Color, Urine YELLOW  YELLOW   APPearance CLEAR  CLEAR   Specific Gravity, Urine 1.025  1.005 - 1.030   pH 5.5  5.0 - 8.0   Glucose, UA 100 (*) NEGATIVE mg/dL   Hgb urine dipstick NEGATIVE  NEGATIVE   Bilirubin Urine NEGATIVE  NEGATIVE   Ketones, ur NEGATIVE  NEGATIVE mg/dL   Protein, ur TRACE (*) NEGATIVE mg/dL   Urobilinogen, UA 0.2  0.0 - 1.0 mg/dL   Nitrite NEGATIVE  NEGATIVE   Leukocytes, UA NEGATIVE  NEGATIVE  URINE MICROSCOPIC-ADD ON      Result Value Ref Range   WBC, UA 0-2  <3 WBC/hpf     Imaging Review Dg Chest Portable 1 View  02/12/2014   CLINICAL DATA:  Chest pressure and shortness of breath since this morning; history of open heart surgery in 1996  EXAM: PORTABLE CHEST - 1 VIEW  COMPARISON:  Portable chest x-ray of December 07, 2013  FINDINGS: The lungs are well-expanded. The pulmonary interstitial markings remain increased. The pulmonary vascularity remains engorged but is not as prominent as on the earlier study. The cardiopericardial silhouette is top-normal in size. The mediastinum is not abnormally widened. There are post median sternotomy changes present with evidence of previous CABG.  IMPRESSION:  Findings are consistent with congestive heart failure with pulmonary interstitial edema. The pulmonary vascular congestion is not quite as severe as on the previous study.   Electronically Signed   By: David  [place]   On: 02/12/2014 09:04     EKG Interpretation None      Date: 02/12/2014  Rate: 58  Rhythm: sinus bradycardia  QRS Axis: normal  Intervals: normal  ST/T Wave abnormalities: nonspecific ST/T changes  Conduction Disutrbances:none  Narrative Interpretation:   Old EKG Reviewed: none available Correction most likely consistent with atrial flutter may be atrial fib rate controlled. PR interval not distinguishable.   CRITICAL CARE Performed by: Shelda Jakes Total critical care time: 30 Critical care time was exclusive of separately billable procedures and treating other patients. Critical care was necessary to treat or prevent imminent or life-threatening deterioration. Critical care was time spent personally by me on the following activities: development of treatment plan with patient and/or surrogate as well as nursing, discussions with consultants, evaluation of patient's response to treatment, examination of patient, obtaining history from patient or surrogate, ordering and performing treatments and interventions, ordering and review of laboratory studies, ordering and review of radiographic studies, pulse oximetry and re-evaluation of patient's condition.    MDM   Final diagnoses:  CHF (congestive heart failure)  Anemia    Patient presented with shortness of breath and some mild chest pain. Patient's initial troponin negative EKG possible atrial fib flutter type pattern old EKG to compare. Patient also with shortness of breath symptoms started about 4:30 snoring. Chest x-ray consistent with congestive heart failure patient's had a history that in the past. Patient also with significant anemia. Patient's hemoglobin  a few days ago was 7.5. Now is down to 6 range.  Transfusion ordered. Patient followed by Dr. Luan Pulling. Will require admission patient woke require blood transfusion slowly and also treating the CHF with Lasix currently. Blood pressure is not elevated. We'll not go with nitroglycerin drip at this point in time.  Patient with known history of atrial for relation. Suspect today's EKG is predominantly that. Patient's blood pressure not elevated heart rate on the bradycardic and of the scale. Patient will be admitted to telemetry.  I personally performed the services described in this documentation, which was scribed in my presence. The recorded information has been reviewed and is accurate.    Caleb Kung, MD 02/12/14 [place], MD 02/12/14 1050

## 2014-02-13 DIAGNOSIS — I509 Heart failure, unspecified: Secondary | ICD-10-CM

## 2014-02-13 DIAGNOSIS — D696 Thrombocytopenia, unspecified: Secondary | ICD-10-CM

## 2014-02-13 DIAGNOSIS — D61818 Other pancytopenia: Secondary | ICD-10-CM

## 2014-02-13 DIAGNOSIS — D649 Anemia, unspecified: Secondary | ICD-10-CM

## 2014-02-13 DIAGNOSIS — J811 Chronic pulmonary edema: Secondary | ICD-10-CM

## 2014-02-13 DIAGNOSIS — D46C Myelodysplastic syndrome with isolated del(5q) chromosomal abnormality: Secondary | ICD-10-CM

## 2014-02-13 LAB — CBC
HEMATOCRIT: 27.4 % — AB (ref 39.0–52.0)
Hemoglobin: 8.8 g/dL — ABNORMAL LOW (ref 13.0–17.0)
MCH: 31.2 pg (ref 26.0–34.0)
MCHC: 32.1 g/dL (ref 30.0–36.0)
MCV: 97.2 fL (ref 78.0–100.0)
Platelets: 36 10*3/uL — ABNORMAL LOW (ref 150–400)
RBC: 2.82 MIL/uL — ABNORMAL LOW (ref 4.22–5.81)
RDW: 23.5 % — ABNORMAL HIGH (ref 11.5–15.5)
WBC: 3.5 10*3/uL — ABNORMAL LOW (ref 4.0–10.5)

## 2014-02-13 LAB — COMPREHENSIVE METABOLIC PANEL
ALBUMIN: 3.1 g/dL — AB (ref 3.5–5.2)
ALT: 12 U/L (ref 0–53)
AST: 12 U/L (ref 0–37)
Alkaline Phosphatase: 73 U/L (ref 39–117)
BUN: 34 mg/dL — AB (ref 6–23)
CO2: 24 mEq/L (ref 19–32)
Calcium: 8.4 mg/dL (ref 8.4–10.5)
Chloride: 101 mEq/L (ref 96–112)
Creatinine, Ser: 2.37 mg/dL — ABNORMAL HIGH (ref 0.50–1.35)
GFR calc Af Amer: 28 mL/min — ABNORMAL LOW (ref >90)
GFR calc non Af Amer: 24 mL/min — ABNORMAL LOW (ref >90)
Glucose, Bld: 183 mg/dL — ABNORMAL HIGH (ref 70–99)
Potassium: 4.3 mEq/L (ref 3.7–5.3)
Sodium: 142 mEq/L (ref 137–147)
Total Bilirubin: 1.3 mg/dL — ABNORMAL HIGH (ref 0.3–1.2)
Total Protein: 6.4 g/dL (ref 6.0–8.3)

## 2014-02-13 LAB — HEMOGLOBIN AND HEMATOCRIT, BLOOD
HCT: 29.1 % — ABNORMAL LOW (ref 39.0–52.0)
Hemoglobin: 9.6 g/dL — ABNORMAL LOW (ref 13.0–17.0)

## 2014-02-13 LAB — PREPARE RBC (CROSSMATCH)

## 2014-02-13 MED ORDER — FUROSEMIDE 10 MG/ML IJ SOLN
20.0000 mg | Freq: Once | INTRAMUSCULAR | Status: AC
  Administered 2014-02-13: 20 mg via INTRAVENOUS
  Filled 2014-02-13: qty 2

## 2014-02-13 MED ORDER — FUROSEMIDE 10 MG/ML IJ SOLN
40.0000 mg | Freq: Once | INTRAMUSCULAR | Status: AC
  Administered 2014-02-13: 40 mg via INTRAVENOUS
  Filled 2014-02-13: qty 4

## 2014-02-13 NOTE — Progress Notes (Signed)
Patient lungs sound more moist than earlier. Also wheezing and SOB noted. Patient receiving blood for a total of 2 units. Contacted MD on call to inform of this. Received order for lasix. Will continue to monitor closely.

## 2014-02-13 NOTE — Progress Notes (Signed)
Subjective: He was admitted yesterday with anemia which was symptomatic. He had congestive heart failure probably on the basis of his anemia. He has been receiving treatments as an outpatient and is known to have a bone marrow disease. He has fallen at home a couple of times.  Objective: Vital signs in last 24 hours: Temp:  [97 F (36.1 C)-98.5 F (36.9 C)] 97.9 F (36.6 C) (05/20 0458) Pulse Rate:  [54-73] 57 (05/20 0458) Resp:  [15-22] 20 (05/20 0458) BP: (104-127)/(44-68) 112/53 mmHg (05/20 0458) SpO2:  [92 %-99 %] 94 % (05/20 0458) Weight:  [81.92 kg (180 lb 9.6 oz)] 81.92 kg (180 lb 9.6 oz) (05/19 1210) Weight change:  Last BM Date: 02/12/14  Intake/Output from previous day: 05/19 0701 - 05/20 0700 In: 1555.7 [P.O.:240; I.V.:500; Blood:815.7] Out: 4400 [Urine:4400]  PHYSICAL EXAM General appearance: alert, cooperative and mild distress Resp: rhonchi bilaterally Cardio: irregularly irregular rhythm GI: soft, non-tender; bowel sounds normal; no masses,  no organomegaly Extremities: extremities normal, atraumatic, no cyanosis or edema  Lab Results:    Basic Metabolic Panel:  Recent Labs  02/12/14 0908 02/13/14 0601  NA 143 142  K 4.2 4.3  CL 107 101  CO2 21 24  GLUCOSE 157* 183*  BUN 35* 34*  CREATININE 2.20* 2.37*  CALCIUM 8.4 8.4   Liver Function Tests:  Recent Labs  02/13/14 0601  AST 12  ALT 12  ALKPHOS 73  BILITOT 1.3*  PROT 6.4  ALBUMIN 3.1*   No results found for this basename: LIPASE, AMYLASE,  in the last 72 hours No results found for this basename: AMMONIA,  in the last 72 hours CBC:  Recent Labs  02/12/14 0908 02/13/14 0601  WBC 2.9* 3.5*  NEUTROABS 1.2*  --   HGB 6.8* 8.8*  HCT 21.8* 27.4*  MCV 102.3* 97.2  PLT 36* 36*   Cardiac Enzymes:  Recent Labs  02/12/14 0908  TROPONINI <0.30   BNP:  Recent Labs  02/12/14 1006  PROBNP 9708.0*   D-Dimer: No results found for this basename: DDIMER,  in the last 72  hours CBG: No results found for this basename: GLUCAP,  in the last 72 hours Hemoglobin A1C: No results found for this basename: HGBA1C,  in the last 72 hours Fasting Lipid Panel: No results found for this basename: CHOL, HDL, LDLCALC, TRIG, CHOLHDL, LDLDIRECT,  in the last 72 hours Thyroid Function Tests: No results found for this basename: TSH, T4TOTAL, FREET4, T3FREE, THYROIDAB,  in the last 72 hours Anemia Panel: No results found for this basename: VITAMINB12, FOLATE, FERRITIN, TIBC, IRON, RETICCTPCT,  in the last 72 hours Coagulation:  Recent Labs  02/12/14 1006  LABPROT 15.0  INR 1.21   Urine Drug Screen: Drugs of Abuse  No results found for this basename: labopia, cocainscrnur, labbenz, amphetmu, thcu, labbarb    Alcohol Level: No results found for this basename: ETH,  in the last 72 hours Urinalysis:  Recent Labs  02/12/14 0945  COLORURINE YELLOW  LABSPEC 1.025  PHURINE 5.5  GLUCOSEU 100*  HGBUR NEGATIVE  BILIRUBINUR NEGATIVE  KETONESUR NEGATIVE  PROTEINUR TRACE*  UROBILINOGEN 0.2  [place]. Labs:  ABGS No results found for this basename: PHART, PCO2, PO2ART, TCO2, HCO3,  in the last 72 hours CULTURES Recent Results (from the past 240 hour(s))  MRSA PCR SCREENING     Status: Abnormal   Collection Time    02/12/14 12:41 PM      Result Value  Ref Range Status   MRSA by PCR POSITIVE (*) NEGATIVE Final   Comment:            The GeneXpert MRSA Assay (FDA     approved for NASAL specimens     only), is one component of a     comprehensive MRSA colonization     surveillance program. It is not     intended to diagnose MRSA     infection nor to guide or     monitor treatment for     MRSA infections.     RESULT CALLED TO, READ BACK BY AND VERIFIED WITH:     RODGERS S AT 1956 ON [id number] BY FORSYTH K   Studies/Results: Dg Chest Portable 1 View  02/12/2014   CLINICAL DATA:  Chest pressure and shortness of breath since  this morning; history of open heart surgery in 1996  EXAM: PORTABLE CHEST - 1 VIEW  COMPARISON:  Portable chest x-ray of December 07, 2013  FINDINGS: The lungs are well-expanded. The pulmonary interstitial markings remain increased. The pulmonary vascularity remains engorged but is not as prominent as on the earlier study. The cardiopericardial silhouette is top-normal in size. The mediastinum is not abnormally widened. There are post median sternotomy changes present with evidence of previous CABG.  IMPRESSION: Findings are consistent with congestive heart failure with pulmonary interstitial edema. The pulmonary vascular congestion is not quite as severe as on the previous study.   Electronically Signed   By: David  Martinique   On: 02/12/2014 09:04    Medications:  Prior to Admission:  Prescriptions prior to admission  Medication Sig Dispense Refill  . acetaminophen (TYLENOL) 500 MG tablet Take 500 mg by mouth every 6 (six) hours as needed for moderate pain.      Marland Kitchen albuterol (PROVENTIL HFA;VENTOLIN HFA) 108 (90 BASE) MCG/ACT inhaler Inhale 2 puffs into the lungs every 6 (six) hours as needed for wheezing.  1 Inhaler  2  . amiodarone (PACERONE) 200 MG tablet Take 0.5 tablets (100 mg total) by mouth daily.  30 tablet  5  . amitriptyline (ELAVIL) 10 MG tablet Take 30 mg by mouth at bedtime.      Marland Kitchen amLODipine (NORVASC) 5 MG tablet TAKE ONE TABLET BY MOUTH EVERY DAY FOR BLOOD PRESSURE  90 tablet  3  . aspirin 325 MG tablet Take 325 mg by mouth daily.      . diazepam (VALIUM) 5 MG tablet Take one tablet by mouth every 6 hours as needed for muscle spasms  120 tablet  5  . furosemide (LASIX) 40 MG tablet Take 1 tablet (40 mg total) by mouth daily. Take one half tablet daily on a routine basis. If your weight goes up by 3 pounds or if you have edema of your legs take one full tablet twice a day for 3 days  30 tablet  11  . HYDROcodone-acetaminophen (NORCO/VICODIN) 5-325 MG per tablet Take 1 by mouth every 6 hours  as needed for pain. *MAX APAP=3GM/24 HRS- ALL SOURCES*  120 tablet  0  . insulin aspart (NOVOLOG) 100 UNIT/ML injection Inject 0-15 Units into the skin every 4 (four) hours.  10 mL  11  . ipratropium-albuterol (DUONEB) 0.5-2.5 (3) MG/3ML SOLN Take 3 mLs by nebulization every 6 (six) hours as needed (shortness of breath).  360 mL  5  . isosorbide mononitrate (IMDUR) 30 MG 24 hr tablet Take 1 tablet (30 mg total) by mouth daily.  30 tablet  12  .  lenalidomide (REVLIMID) 5 MG capsule Take 1 capsule (5 mg total) by mouth daily.  28 capsule  0  . metFORMIN (GLUCOPHAGE) 500 MG tablet Take 500 mg by mouth 2 (two) times daily with a meal.      . metoprolol tartrate (LOPRESSOR) 25 MG tablet Take 25 mg by mouth 2 (two) times daily.        . Multiple Vitamin (MULTIVITAMIN) tablet Take 1 tablet by mouth daily.        . nitroGLYCERIN (NITROSTAT) 0.4 MG SL tablet Place 1 tablet (0.4 mg total) under the tongue every 5 (five) minutes x 3 doses as needed for chest pain (up to 3 doses).  25 tablet  3  . potassium chloride SA (K-DUR,KLOR-CON) 20 MEQ tablet Take 1 tablet (20 mEq total) by mouth daily.  30 tablet  12  . predniSONE (DELTASONE) 20 MG tablet Take 15 mg by mouth daily with breakfast.      . pregabalin (LYRICA) 75 MG capsule Take 1 capsule (75 mg total) by mouth daily.  30 capsule  5  . Red Yeast Rice 600 MG TABS Take 1 tablet by mouth daily.        . silodosin (RAPAFLO) 8 MG CAPS capsule Take 8 mg by mouth daily with breakfast.         Scheduled: . amiodarone  100 mg Oral Daily  . amitriptyline  30 mg Oral QHS  . amLODipine  5 mg Oral Daily  . antiseptic oral rinse  15 mL Mouth Rinse BID  . aspirin  325 mg Oral Daily  . Chlorhexidine Gluconate Cloth  6 each Topical Q0600  . furosemide  40 mg Oral Daily  . isosorbide mononitrate  30 mg Oral Daily  . metoprolol tartrate  25 mg Oral BID  . multivitamin with minerals  1 tablet Oral Daily  . mupirocin ointment  1 application Nasal BID  . potassium  chloride SA  20 mEq Oral Daily  . predniSONE  15 mg Oral Q breakfast  . pregabalin  75 mg Oral Daily  . tamsulosin  0.4 mg Oral QPC supper   Continuous: . sodium chloride 10 mL/hr at 02/12/14 1032   QMV:[id number], albuterol, diazepam, HYDROcodone-acetaminophen, ipratropium-albuterol, nitroGLYCERIN  Assesment: He has anemia which is related to his myelodysplastic syndrome 5Q minus. He has congestive heart failure because of his anemia. He has been short of breath on exertion. He is known to have coronary artery occlusive disease but is not having any chest pain right now. He has fallen at home. He had been in the hospital and then went to a skilled care facility for rehabilitation and I am concerned that he may need to do that again Active Problems:   Anemia   Myelodysplastic syndrome with 5 q minus   CHF (congestive heart failure)   Exertional dyspnea   Chest pain of uncertain etiology    Plan: I'm going to have him get PT consultation. He will have another unit of blood. He will continue all his other medications. Hematology oncology consultation    LOS: 1 day   [name] 02/13/2014, 8:41 AM

## 2014-02-13 NOTE — Consult Note (Signed)
[facility] Consultation Oncology  Name: STORY VANVRANKEN      MRN: [id number]    Location: H829/H371-69  Date: 02/13/2014 Time:10:43 AM   REFERRING PHYSICIAN:  [name], MD  REASON FOR CONSULT:  Progressive anemia with thrombocytopenia   DIAGNOSIS:  MDS with 5Q- Syndrome.  HISTORY OF PRESENT ILLNESS:   Parminder is an 80 yo Caucasian man who is well-known to the [facility] where he receives Revlimid treatment for his 5Q-minus Syndrome (MDS).  He has a past medical history significant for CAD, CHF, A-fib, PVD, carotid artrery disease/stenosis, H/O NSTEMI, Stage 3 Chronic renal disease who presented to the [facility] ED on 5/19 for SOB and dyspnea on exertion. He was noted to be pancytopenic and suspected CHF and therefore admitted to the [facility] for further evaluation and management.  Hematology was consulted for his pancytopenia with progressive anemia and thrombocytopenia.   The other day, Dr. Kathaleen Grinder called me and asked if we would transfuse him PRBCs for worsening anemia.  I cannot remember the exact Hgb value, but I do remember it was certainly low enough to transfuse.  As a result, we set him up at the [facility] for a type and screen and 2 unit PRBC on 02/12/2014.  Unfortunately, he felt bad enough to report to the ED and he never reported to his appointment at the [facility].   He says he feels far.  "I feel like a three."  On further questioning, I gather he feels like a three on the 1-10 scale, with 1 feeling wonderful and 10 feeling poor.   I personally reviewed and went over laboratory results with the patient.  The results are noted within this dictation.  I personally reviewed and went over radiographic studies with the patient.  The results are noted within this dictation.    Chart is reviewed.  With his acute anemia likely being the cause of his CHF, he has been transfused blood products.  He is on Revlimid, but we have placed that on hold  as of yesterday in an effort to rule out Revlimid-induced cytopenias.  Additionally, we will change his regimen to Revlimid 5 mg 21 days on and 7 days off.  We will start his 7 day respite during this hospitalization.   He wants to make sure he is not on any pain medication because he does not tolerate well.  He denies any pain and would prefer only Tylenol be used for discomfort.  His medications are updated accordingly.   Hematologically, he denies any complaints and ROS questioning is negative.   PAST MEDICAL HISTORY:   Past Medical History  Diagnosis Date  . Essential hypertension, benign   . COPD (chronic obstructive pulmonary disease)   . Coronary atherosclerosis of native coronary artery     Multivessel status post CABG 1996  . Arthritis   . Atrial fibrillation     Coumadin stopped 12/2011 due to anemia  . Dysphagia   . Anemia     Requiring blood transfusions  . Carotid stenosis     Dr Scot Dock   . Diabetes mellitus, type 2   . Mixed hyperlipidemia     Statin intolerant  . Hematuria     Felt related to Foley insertion  . Cardiomyopathy     LVEF 45-50%  . Melena     EGD & Colonoscopy 09/2011 revealed gastritis, erosions, scars, diverticula, 8 colon polyps (largest 1.6cm, not removed 2/2 anticoagulation), small AVM in  transverse colon  . History of tobacco abuse     Quit 1992  . Anxiety   . GERD (gastroesophageal reflux disease)   . Myocardial infarction   . Thrombocytopenia   . Myelodysplastic syndrome with 5 q minus 10/17/2012    On Revlimid 5 mg daily 21 days on and 7 days off  . Aortic stenosis     Mild  . CKD (chronic kidney disease) stage 3, GFR 30-59 ml/min   . Pneumonia 11/2013    history of    ALLERGIES: Allergies  Allergen Reactions  . Statins Other (See Comments)    Causes legs to hurt.   . Tape     Causes skin to peel off.       MEDICATIONS: I have reviewed the patient's current medications.     PAST SURGICAL HISTORY Past Surgical History   Procedure Laterality Date  . Knee surgery  1960    Right knee cartilage  . Rotator cuff repair  1990    right   . Inguinal hernia repair  2000 and 2010    left  . Cholecystectomy  05/2010    Lap chole with biologic mesh repair/reinforcement by  Dr Rise Patience  . Back surgery  02/2006    ruptured disk,  post op diskitis infection requiring prolonged hospital stay and  surgical I & D  . Tonsillectomy    . Esophagogastroduodenoscopy  10/08/2011    Procedure: ESOPHAGOGASTRODUODENOSCOPY (EGD);  Surgeon: [name], MD;  Location: [facility] END[facility] SUITE;  Service: Endoscopy;  Laterality: N/A;  . Colonoscopy  10/08/2011    Procedure: COLONOSCOPY;  Surgeon: [name], MD;  Location: [facility] ENDO SUITE;  Service: Endoscopy;  Laterality: N/A;  . Cataract extraction, bilateral    . Peg placement  07/2006    placed due to prolonged infection, poor po intake, malnutrition during several week hospital stay resulting from ruptured disc that became infected following surgery  . Colonoscopy  01/14/2012    Procedure: COLONOSCOPY;  Surgeon: [name], MD;  Location: [facility] SU[facility];  Service: Endoscopy;  Laterality: N/A;  945  . Coronary artery bypass graft  1996    CABG x 4  . Ventral hernia repair    . Colonoscopy  04/20/2012    Procedure: COLONOSCOPY;  Surgeon: [name], MD;  Location: [facility];  Service: Endoscopy;  Laterality: N/A;  1030  . Pr vein bypass graft,aorto-fem-pop  1996  . Spine surgery    . Joint replacement  1960    Right Knee  . Lymph node biopsy  08/15/2012    Procedure: LYMPH NODE BIOPSY;  Surgeon: Scherry Ran, MD;  Location: [facility] ORS;  Service: General;  Laterality: Left;  Cervical Lymph Node Bx in Minor Room  . Bone marrow biopsy    . Bone marrow aspiration      FAMILY HISTORY: Family History  Problem Relation Age of Onset  . Heart disease Sister   . Cancer Sister   . Hyperlipidemia Sister   . Hypertension Sister   . Heart disease Brother     Heart Diseast  before age 31  . Coronary artery disease Mother   . Cancer Father     Stomach  . Anesthesia problems Neg Hx   . Hypotension Neg Hx   . Malignant hyperthermia Neg Hx   . Pseudochol deficiency Neg Hx     SOCIAL HISTORY:  reports that he quit smoking about 23 years ago. His smoking use included Cigarettes. He  has a 80 pack-year smoking history. He has never used smokeless tobacco. He reports that he does not drink alcohol or use illicit drugs.  PERFORMANCE STATUS: The patient's performance status is 3 - Symptomatic, >50% confined to bed  PHYSICAL EXAM: Most Recent Vital Signs: Blood pressure 114/53, pulse 62, temperature 97.9 F (36.6 C), temperature source Oral, resp. rate 20, height 5' 11.5" (1.816 m), weight 180 lb 9.6 oz (81.92 kg), SpO2 94.00%. General appearance: alert, cooperative, appears older than stated age, mild distress and slowed mentation Head: Normocephalic, without obvious abnormality, atraumatic Eyes: negative findings: lids and lashes normal, conjunctivae and sclerae normal and corneas clear Lungs: clear to auscultation bilaterally Heart: regular rate and rhythm Abdomen: soft, non-tender; bowel sounds normal; no masses,  no organomegaly Extremities: extremities normal, atraumatic, no cyanosis or edema and multiple ecchymoses on UE  Skin: Skin color, texture, turgor normal. No rashes or lesions or multiple ecchymoses Lymph nodes: Cervical, supraclavicular, and axillary nodes normal. Neurologic: Grossly normal  LABORATORY DATA:  Results for orders placed during the hospital encounter of 02/12/14 (from the past 48 hour(s))  CBC WITH DIFFERENTIAL     Status: Abnormal   Collection Time    02/12/14  9:08 AM      Result Value Ref Range   WBC 2.9 (*) 4.0 - 10.5 K/uL   RBC 2.13 (*) 4.22 - 5.81 MIL/uL   Hemoglobin 6.8 (*) 13.0 - 17.0 g/dL   Comment: EPITHELIAL CASTS     CRITICAL RESULT CALLED TO, READ BACK BY AND VERIFIED WITH:     TIFFANY VOGLER RN ON [id number] AT 0930 BY  RESSEGGER R   HCT 21.8 (*) 39.0 - 52.0 %   MCV 102.3 (*) 78.0 - 100.0 fL   MCH 31.9  26.0 - 34.0 pg   MCHC 31.2  30.0 - 36.0 g/dL   RDW 34.5 (*) 63.4 - 61.7 %   Platelets 36 (*) 150 - 400 K/uL   Comment: SPECIMEN CHECKED FOR CLOTS     PLATELET COUNT CONFIRMED BY SMEAR     CALLED TO TIFFANY VOGLER RN ON [id number] AT 0930 BY RESSGEER R    Neutrophils Relative % 43  43 - 77 %   Neutro Abs 1.2 (*) 1.7 - 7.7 K/uL   Lymphocytes Relative 38  12 - 46 %   Lymphs Abs 1.1  0.7 - 4.0 K/uL   Monocytes Relative 5  3 - 12 %   Monocytes Absolute 0.1  0.1 - 1.0 K/uL   Eosinophils Relative 10 (*) 0 - 5 %   Eosinophils Absolute 0.3  0.0 - 0.7 K/uL   Basophils Relative 5 (*) 0 - 1 %   Basophils Absolute 0.1  0.0 - 0.1 K/uL   Smear Review PLATELETS APPEAR DECREASED    BASIC METABOLIC PANEL     Status: Abnormal   Collection Time    02/12/14  9:08 AM      Result Value Ref Range   Sodium 143  137 - 147 mEq/L   Potassium 4.2  3.7 - 5.3 mEq/L   Chloride 107  96 - 112 mEq/L   CO2 21  19 - 32 mEq/L   Glucose, Bld 157 (*) 70 - 99 mg/dL   BUN 35 (*) 6 - 23 mg/dL   Creatinine, Ser 3.16 (*) 0.50 - 1.35 mg/dL   Calcium 8.4  8.4 - 03.8 mg/dL   GFR calc non Af Amer 27 (*) >90 mL/min   GFR calc Af Amer 31 (*) >90  mL/min   Comment: (NOTE)     The eGFR has been calculated using the CKD EPI equation.     This calculation has not been validated in all clinical situations.     eGFR's persistently <90 mL/min signify possible Chronic Kidney     Disease.  TROPONIN I     Status: None   Collection Time    02/12/14  9:08 AM      Result Value Ref Range   Troponin I <0.30  <0.30 ng/mL   Comment:            Due to the release kinetics of cTnI,     a negative result within the first hours     of the onset of symptoms does not rule out     myocardial infarction with certainty.     If myocardial infarction is still suspected,     repeat the test at appropriate intervals.  URINALYSIS, ROUTINE W REFLEX MICROSCOPIC      Status: Abnormal   Collection Time    02/12/14  9:45 AM      Result Value Ref Range   Color, Urine YELLOW  YELLOW   APPearance CLEAR  CLEAR   Specific Gravity, Urine 1.025  1.005 - 1.030   pH 5.5  5.0 - 8.0   Glucose, UA 100 (*) NEGATIVE mg/dL   Hgb urine dipstick NEGATIVE  NEGATIVE   Bilirubin Urine NEGATIVE  NEGATIVE   Ketones, ur NEGATIVE  NEGATIVE mg/dL   Protein, ur TRACE (*) NEGATIVE mg/dL   Urobilinogen, UA 0.2  0.0 - 1.0 mg/dL   Nitrite NEGATIVE  NEGATIVE   Leukocytes, UA NEGATIVE  NEGATIVE  URINE MICROSCOPIC-ADD ON     Status: None   Collection Time    02/12/14  9:45 AM      Result Value Ref Range   WBC, UA 0-2  <3 WBC/hpf  PROTIME-INR     Status: None   Collection Time    02/12/14 10:06 AM      Result Value Ref Range   Prothrombin Time 15.0  11.6 - 15.2 seconds   INR 1.21  0.00 - 1.49  PRO B NATRIURETIC PEPTIDE     Status: Abnormal   Collection Time    02/12/14 10:06 AM      Result Value Ref Range   Pro B Natriuretic peptide (BNP) 9708.0 (*) 0 - 450 pg/mL  TYPE AND SCREEN     Status: None   Collection Time    02/12/14 10:07 AM      Result Value Ref Range   ABO/RH(D) A NEG     Antibody Screen NEG     Sample Expiration 02/15/2014     Unit Number [id number]     Blood Component Type RBC, LR IRR     Unit division 00     Status of Unit ISSUED,FINAL     Transfusion Status OK TO TRANSFUSE     Crossmatch Result Compatible     Unit Number [id number]     Blood Component Type RBC, LR IRR     Unit division 00     Status of Unit ISSUED,FINAL     Transfusion Status OK TO TRANSFUSE     Crossmatch Result Compatible    PREPARE RBC (CROSSMATCH)     Status: None   Collection Time    02/12/14 10:07 AM      Result Value Ref Range   Order Confirmation ORDER PROCESSED BY BLOOD BANK    PREPARE  RBC (CROSSMATCH)     Status: None   Collection Time    02/12/14 10:07 AM      Result Value Ref Range   Order Confirmation ORDER PROCESSED BY BLOOD BANK    MRSA PCR SCREENING      Status: Abnormal   Collection Time    02/12/14 12:41 PM      Result Value Ref Range   MRSA by PCR POSITIVE (*) NEGATIVE   Comment:            The GeneXpert MRSA Assay (FDA     approved for NASAL specimens     only), is one component of a     comprehensive MRSA colonization     surveillance program. It is not     intended to diagnose MRSA     infection nor to guide or     monitor treatment for     MRSA infections.     RESULT CALLED TO, READ BACK BY AND VERIFIED WITH:     RODGERS S AT 1956 ON [id number] BY FORSYTH K  CBC     Status: Abnormal   Collection Time    02/13/14  6:01 AM      Result Value Ref Range   WBC 3.5 (*) 4.0 - 10.5 K/uL   RBC 2.82 (*) 4.22 - 5.81 MIL/uL   Hemoglobin 8.8 (*) 13.0 - 17.0 g/dL   HCT 27.4 (*) 39.0 - 52.0 %   MCV 97.2  78.0 - 100.0 fL   MCH 31.2  26.0 - 34.0 pg   MCHC 32.1  30.0 - 36.0 g/dL   RDW 23.5 (*) 11.5 - 15.5 %   Platelets 36 (*) 150 - 400 K/uL   Comment: SPECIMEN CHECKED FOR CLOTS     PLATELET COUNT CONFIRMED BY SMEAR  COMPREHENSIVE METABOLIC PANEL     Status: Abnormal   Collection Time    02/13/14  6:01 AM      Result Value Ref Range   Sodium 142  137 - 147 mEq/L   Potassium 4.3  3.7 - 5.3 mEq/L   Chloride 101  96 - 112 mEq/L   CO2 24  19 - 32 mEq/L   Glucose, Bld 183 (*) 70 - 99 mg/dL   BUN 34 (*) 6 - 23 mg/dL   Creatinine, Ser 2.37 (*) 0.50 - 1.35 mg/dL   Calcium 8.4  8.4 - 10.5 mg/dL   Total Protein 6.4  6.0 - 8.3 g/dL   Albumin 3.1 (*) 3.5 - 5.2 g/dL   AST 12  0 - 37 U/L   ALT 12  0 - 53 U/L   Alkaline Phosphatase 73  39 - 117 U/L   Total Bilirubin 1.3 (*) 0.3 - 1.2 mg/dL   GFR calc non Af Amer 24 (*) >90 mL/min   GFR calc Af Amer 28 (*) >90 mL/min   Comment: (NOTE)     The eGFR has been calculated using the CKD EPI equation.     This calculation has not been validated in all clinical situations.     eGFR's persistently <90 mL/min signify possible Chronic Kidney     Disease.      RADIOGRAPHY: Dg Chest Portable 1  View  02/12/2014   CLINICAL DATA:  Chest pressure and shortness of breath since this morning; history of open heart surgery in 1996  EXAM: PORTABLE CHEST - 1 VIEW  COMPARISON:  Portable chest x-ray of December 07, 2013  FINDINGS: The lungs are well-expanded. The  pulmonary interstitial markings remain increased. The pulmonary vascularity remains engorged but is not as prominent as on the earlier study. The cardiopericardial silhouette is top-normal in size. The mediastinum is not abnormally widened. There are post median sternotomy changes present with evidence of previous CABG.  IMPRESSION: Findings are consistent with congestive heart failure with pulmonary interstitial edema. The pulmonary vascular congestion is not quite as severe as on the previous study.   Electronically Signed   By: David  Martinique   On: 02/12/2014 09:04       PATHOLOGY:  None   ASSESSMENT:  1. Progressive anemia with thrombocytopenia.  S/P 3 units of PRBCs on 02/12/2014  2. Pancytopenia 3. MDS with 5Q- Syndrome, on Revlimid 5 mg daily.  This will be changed to 5 mg daily 21 days on and 7 days off. 4. CHF with pulmonary intersitial edema, per chest Xray on  5/19.  Patient Active Problem List   Diagnosis Date Noted  . CHF (congestive heart failure) 02/12/2014  . Exertional dyspnea 02/12/2014  . Chest pain of uncertain etiology 03/70/4888  . Diabetes 01/01/2014  . Chronic kidney disease (CKD) stage G3b/A2, moderately decreased glomerular filtration rate (GFR) between 30-44 mL/min/1.73 square meter and albuminuria creatinine ratio between 30-299 mg/g 12/14/2013  . NSTEMI (non-ST elevated myocardial infarction) 11/28/2013  . HCAP (healthcare-associated pneumonia) 11/27/2013  . Community acquired pneumonia 11/27/2013  . Acute on chronic systolic heart failure 91/69/4503  . Other pancytopenia 01/08/2013  . Non-ST elevation MI (NSTEMI) 01/06/2013  . High output heart failure 10/25/2012  . Myelodysplastic syndrome with 5 q minus  10/17/2012  . History of colonic polyps 02/28/2012  . Anemia 01/10/2012  . Angina effort 01/06/2012  . AVM (arteriovenous malformation) of colon without hemorrhage 01/04/2012  . GI bleeding 10/09/2011  . Long term current use of anticoagulant 12/22/2010  . CAROTID ARTERY STENOSIS 10/16/2010  . DYSLIPIDEMIA 04/11/2009  . CAD 04/11/2009  . Atrial fibrillation 04/11/2009  . PVD 04/11/2009  . OSTEOARTHRITIS 04/11/2009     PLAN:  1. I personally reviewed and went over laboratory results with the patient.  The results are noted within this dictation. 2. I personally reviewed and went over radiographic studies with the patient.  The results are noted within this dictation.   3. Chart reviewed 4. Hold Revlimid x 7 days 5. Change Revlimid therapy to 5 mg daily x 21 days with a 7 day respite. 6. Labs daily: CBC diff 7. Recommend PRBC transfusion for Hgb below 9 g/dL given CAD and other cardiac diagnoses.  Recommend irradiated blood products. 8. Recommend platelet transfusion for platelet count less than 10,000 and/or active bleeding.  Recommend irradiated blood products. 9. Will not transfuse another unit today.  Will wait and see what his Hgb is tomorrow and if lower, then I will transfuse 2 units of PRBCs. 10.  Will follow along as an inpatient.  At time of discharge he should follow-up with [facility].   All questions were answered. The patient knows to call the clinic with any problems, questions or concerns. We can certainly see the patient much sooner if necessary.  Patient and plan discussed with Dr. Farrel Gobble and he is in agreement with the aforementioned.   [facility] 02/13/2014

## 2014-02-13 NOTE — Clinical Documentation Improvement (Signed)
PLEASE SPECIFY TYPE AND ACUITY OF CHF: Possible Clinical Conditions?  Chronic Systolic Congestive Heart Failure Chronic Diastolic Congestive Heart Failure Chronic Systolic & Diastolic Congestive Heart Failure Acute Systolic Congestive Heart Failure Acute Diastolic Congestive Heart Failure Acute Systolic & Diastolic Congestive Heart Failure Acute on Chronic Systolic Congestive Heart Failure Acute on Chronic Diastolic Congestive Heart Failure Acute on Chronic Systolic & Diastolic Congestive Heart Failure Other Condition Cannot Clinically Determine  Supporting Information: (As per notes) "He has congestive heart failure because of his anemia." " Hx of CHF"  Thank You, Alessandra Grout, RN, BSN, CCDS, Clinical Documentation Specialist:  [phone]   [phone]=[employer] [place]- Health Information Management

## 2014-02-13 NOTE — Progress Notes (Signed)
Inpatient Diabetes Program Recommendations  AACE/ADA: New Consensus Statement on Inpatient Glycemic Control (2013)  Target Ranges:  Prepandial:   less than 140 mg/dL      Peak postprandial:   less than 180 mg/dL (1-2 hours)      Critically ill patients:  140 - 180 mg/dL   Results for NIK, GORRELL (MRN [id number]) as of 02/13/2014 13:22  Ref. Range 02/12/2014 09:08 02/13/2014 06:01  Glucose Latest Range: 70-99 mg/dL 157 (H) 183 (H)   Diabetes history: DM2 Outpatient Diabetes medications: Metformin 500 mg BID, Novolog 0-15 units Q4H Current orders for Inpatient glycemic control: None  Inpatient Diabetes Program Recommendations Correction (SSI): Please order CBGs with Novolog correction scale ACHS.  Thanks, Barnie Alderman, RN, MSN, CCRN Diabetes Coordinator Inpatient Diabetes Program [phone] (Team Pager) [phone] ([facility] office) [phone] [facility] office)

## 2014-02-13 NOTE — Evaluation (Signed)
Physical Therapy Evaluation Patient Details Name: [name] MRN: [id number] DOB: 08-19-1932 Today's Date: 02/13/2014   History of Present Illness  80yo male adm 02/12/14 with SOB,  HF, myelodysplastic syndrome, chronic anemia; PMHx:  HTN, COPD, CAD, CABG, DM, R RCR,  back surgery, R TKA  Clinical Impression  Pt will benefit from PT to address deficits below; no family present at time of eval to determine pt baseline although chart states pt independent; will follow in acute, likely will need SNF as he is requiring total to max assist for bed mobility at time of this eva    Follow Up Recommendations SNF    Equipment Recommendations  None recommended by PT    Recommendations for Other Services       Precautions / Restrictions Precautions Precautions: Fall Restrictions Weight Bearing Restrictions: No Other Position/Activity Restrictions: Pleasant 80yo M w/ complex hx to include myelodysplastic syndrome who presented to the ED w/ c/o acute onset of SOB and L sided chest pressure as of 04:00 this AM. he has noted progressive weakness and lethargy      Mobility  Bed Mobility Overal bed mobility: Needs Assistance Bed Mobility: Sit to Supine;Supine to Sit     Supine to sit: Total assist Sit to supine: Max assist   General bed mobility comments: assist with UB and LB, pt extemely shaky, jerking movements in UEs and trunk when  on EOB  Transfers                    Ambulation/Gait                Stairs            Wheelchair Mobility    Modified Rankin (Stroke Patients Only)       Balance Overall balance assessment: Needs assistance Sitting-balance support: Bilateral upper extremity supported;Feet supported;Feet unsupported Sitting balance-Leahy Scale: Zero                                       Pertinent Vitals/Pain No family present at this time;    Home Living Family/patient expects to be discharged to:: Private  residence Living Arrangements: Spouse/significant other Available Help at Discharge: Family Type of Home: House Home Access: Stairs to enter   Technical brewer of Steps: pt unsure Home Layout: One level Home Equipment: Cane - single point      Prior Function Level of Independence: Independent;Independent with assistive device(s)         Comments: uses cane occasionally     Hand Dominance        Extremity/Trunk Assessment   Upper Extremity Assessment: Generalized weakness;LUE deficits/detail       LUE Deficits / Details: bruising to L knee and lower leg, pt can't recall what caused this   Lower Extremity Assessment: Generalized weakness         Communication   Communication: No difficulties  Cognition Arousal/Alertness:  (falls asleep easily) Behavior During Therapy: WFL for tasks assessed/performed Overall Cognitive Status:  (oriented to place and self)       Memory: Decreased short-term memory              General Comments      Exercises        Assessment/Plan    PT Assessment Patient needs continued PT services  PT Diagnosis Generalized weakness   PT Problem List Decreased strength;Decreased activity  tolerance;Decreased balance;Decreased mobility;Decreased safety awareness  PT Treatment Interventions DME instruction;Gait training;Functional mobility training;Therapeutic activities;Therapeutic exercise;Patient/family education   PT Goals (Current goals can be found in the Care Plan section) Acute Rehab PT Goals Patient Stated Goal: pt does not state PT Goal Formulation: With patient Time For Goal Achievement: 02/27/14 Potential to Achieve Goals: Good    Frequency Min 3X/week   Barriers to discharge        Co-evaluation               End of Session   Activity Tolerance: Patient limited by fatigue;Treatment limited secondary to medical complications (Comment) (dyspneic at rest) Patient left: in bed;with call bell/phone  within reach           Time: 1201-1215 PT Time Calculation (min): 14 min   Charges:   PT Evaluation $Initial PT Evaluation Tier I: 1 Procedure PT Treatments $Therapeutic Activity: 8-22 mins   PT G Codes:          Neil Crouch 02/13/2014, 12:27 PM

## 2014-02-14 ENCOUNTER — Inpatient Hospital Stay (HOSPITAL_COMMUNITY): Payer: Medicare Other

## 2014-02-14 DIAGNOSIS — I6529 Occlusion and stenosis of unspecified carotid artery: Secondary | ICD-10-CM

## 2014-02-14 DIAGNOSIS — I359 Nonrheumatic aortic valve disorder, unspecified: Secondary | ICD-10-CM

## 2014-02-14 DIAGNOSIS — N183 Chronic kidney disease, stage 3 unspecified: Secondary | ICD-10-CM

## 2014-02-14 DIAGNOSIS — E785 Hyperlipidemia, unspecified: Secondary | ICD-10-CM

## 2014-02-14 DIAGNOSIS — I739 Peripheral vascular disease, unspecified: Secondary | ICD-10-CM

## 2014-02-14 DIAGNOSIS — K5900 Constipation, unspecified: Secondary | ICD-10-CM

## 2014-02-14 DIAGNOSIS — R079 Chest pain, unspecified: Secondary | ICD-10-CM

## 2014-02-14 DIAGNOSIS — I2510 Atherosclerotic heart disease of native coronary artery without angina pectoris: Secondary | ICD-10-CM

## 2014-02-14 DIAGNOSIS — I5033 Acute on chronic diastolic (congestive) heart failure: Secondary | ICD-10-CM

## 2014-02-14 DIAGNOSIS — I4891 Unspecified atrial fibrillation: Secondary | ICD-10-CM

## 2014-02-14 LAB — BASIC METABOLIC PANEL
BUN: 44 mg/dL — AB (ref 6–23)
CHLORIDE: 98 meq/L (ref 96–112)
CO2: 26 mEq/L (ref 19–32)
Calcium: 8.7 mg/dL (ref 8.4–10.5)
Creatinine, Ser: 2.28 mg/dL — ABNORMAL HIGH (ref 0.50–1.35)
GFR calc Af Amer: 29 mL/min — ABNORMAL LOW (ref >90)
GFR calc non Af Amer: 25 mL/min — ABNORMAL LOW (ref >90)
Glucose, Bld: 176 mg/dL — ABNORMAL HIGH (ref 70–99)
POTASSIUM: 3.9 meq/L (ref 3.7–5.3)
Sodium: 139 mEq/L (ref 137–147)

## 2014-02-14 LAB — CBC WITH DIFFERENTIAL/PLATELET
BASOS ABS: 0.1 10*3/uL (ref 0.0–0.1)
Basophils Relative: 2 % — ABNORMAL HIGH (ref 0–1)
EOS PCT: 4 % (ref 0–5)
Eosinophils Absolute: 0.1 10*3/uL (ref 0.0–0.7)
HCT: 29 % — ABNORMAL LOW (ref 39.0–52.0)
Hemoglobin: 9.4 g/dL — ABNORMAL LOW (ref 13.0–17.0)
Lymphocytes Relative: 41 % (ref 12–46)
Lymphs Abs: 1.5 10*3/uL (ref 0.7–4.0)
MCH: 31 pg (ref 26.0–34.0)
MCHC: 32.4 g/dL (ref 30.0–36.0)
MCV: 95.7 fL (ref 78.0–100.0)
MONO ABS: 0.2 10*3/uL (ref 0.1–1.0)
MONOS PCT: 5 % (ref 3–12)
NEUTROS ABS: 1.7 10*3/uL (ref 1.7–7.7)
Neutrophils Relative %: 48 % (ref 43–77)
Platelets: 35 10*3/uL — ABNORMAL LOW (ref 150–400)
RBC: 3.03 MIL/uL — AB (ref 4.22–5.81)
RDW: 21.7 % — AB (ref 11.5–15.5)
Smear Review: DECREASED
WBC: 3.6 10*3/uL — ABNORMAL LOW (ref 4.0–10.5)

## 2014-02-14 LAB — TSH: TSH: 1.34 u[IU]/mL (ref 0.350–4.500)

## 2014-02-14 LAB — TROPONIN I: Troponin I: <0.3 ng/mL (ref <0.30)

## 2014-02-14 MED ORDER — ISOSORBIDE MONONITRATE ER 60 MG PO TB24
60.0000 mg | ORAL_TABLET | Freq: Every day | ORAL | Status: DC
  Administered 2014-02-15: 60 mg via ORAL
  Filled 2014-02-14: qty 1

## 2014-02-14 MED ORDER — MAGNESIUM HYDROXIDE 400 MG/5ML PO SUSP
30.0000 mL | Freq: Once | ORAL | Status: AC
  Administered 2014-02-14: 30 mL via ORAL
  Filled 2014-02-14: qty 30

## 2014-02-14 NOTE — Consult Note (Signed)
The patient was seen and examined, and I agree with the assessment and plan as documented above, with modifications as noted below. Pt with cardiovascular history as noted above admitted with chest pain, acute on chronic diastolic heart failure, and anemia (has myelodysplastic syndrome). He was given 3 units PRBC's. He tells me he experienced 3 hours of chest pressure earlier this morning. He was alert and oriented during my evaluation. Since hospitalization in March, he said he's had one or two episodes of chest pain, promptly relieved with one SL nitro. He is currently pain free. Troponins normal thus far. ECG without acute ischemic changes but ST-T abnormalities present. CXR pending. In March, he sustained a NSTEMI and at that time, Dr. Domenic Polite discussed case with Dr. Burt Knack (interventional cardiology) who felt pt had poor targets for revascularization and given his overall comorbidities, medical management was favored. I concur with this management strategy. Will increase nitrates. Monitor BP in setting of aortic stenosis. Would not recommend stress testing for aforementioned reasons. From a heart failure standpoint, he appears to be compensated. Continue current doses of metoprolol, amiodarone, Lasix, and ASA.

## 2014-02-14 NOTE — Progress Notes (Addendum)
Physical Therapy Treatment Patient Details Name: CHON BUHL MRN: [id number] DOB: 06/01/1932 Today's Date: 02/14/2014 1 GT 1 TE 1 TA          PT Comments    Patient did well today with therapy and no noticeable confusion today. Patient was able to complete 180' of gait training using [id number] and  without O2. Patient  was able to maintain sats at 93% and above for 79mins during bathroom use and ambulation. At end of session after seated rest pt O2sat was 92% so O2 was donned again for rest in recliner. Supine and seated therex also completed.                            Mobility  Bed Mobility Overal bed mobility: Needs Assistance Bed Mobility: Sidelying to Sit   Sidelying to sit: HOB elevated;Modified independent (Device/Increase time)          Transfers Overall transfer level: Needs assistance Equipment used: Rolling walker (2 wheeled) Transfers: Sit to/from Stand Sit to Stand: Min assist         General transfer comment: slight assistance to initiate standing from low surface of unelevated bed and toilet  Ambulation/Gait Ambulation/Gait assistance: Supervision Ambulation Distance (Feet): 180 Feet Assistive device: Rolling walker (2 wheeled) Gait Pattern/deviations: Step-through pattern;Trunk flexed;Decreased step length - right;Decreased step length - left   Gait velocity interpretation: Below normal speed for age/gender General Gait Details:  (Pt amb without O2 and sat remained >93%)                                                                       Exercises General Exercises - Lower Extremity Heel Slides: AROM;Both;15 reps;Supine Hip ABduction/ADduction: AROM;Both;10 reps;Supine Straight Leg Raises: AROM;Both;10 reps;Supine LAQS x10 seated     Prior Function            PT Goals (current goals can now be found in the care plan section) Progress towards PT goals: Progressing toward goals      End of Session Equipment Utilized During Treatment: Gait belt Activity Tolerance: Patient tolerated treatment well Patient left: in chair;with call bell/phone within reach;with chair alarm set     Time: 1320-1410 PT Time Calculation (min): 50 min  Charges:  $Gait Training: 8-22 mins $Therapeutic Exercise: 8-22 mins $Therapeutic Activity: 8-22 mins                    G Codes:      Molli Knock 02/14/2014, 2:26 PM

## 2014-02-14 NOTE — Progress Notes (Signed)
Subjective: Patient notes that he feels better today compared to yesterday.  He notes constipation and I will therefore order MOM.  The nurse reports that he complained of chest pain and she was prepared to give him Nitro, but on re-evaluation, he reported no chest pain.   Objective: Vital signs in last 24 hours: Temp:  [97.4 F (36.3 C)-97.8 F (36.6 C)] 97.5 F (36.4 C) (05/21 0548) Pulse Rate:  [56-75] 74 (05/21 0822) Resp:  [20-22] 22 (05/21 0822) BP: (103-132)/(48-84) 108/50 mmHg (05/21 0822) SpO2:  [93 %-99 %] 98 % (05/21 0822)  Intake/Output from previous day: 05/20 0800 - 05/21 0759 In: 1437.2 [P.O.:240; I.V.:934.7; Blood:262.5] Out: 3150 [Urine:3150] Intake/Output this shift:    General appearance: alert, cooperative and no distress  Lab Results:   Recent Labs  02/13/14 0601 02/13/14 1759 02/14/14 0609  WBC 3.5*  --  3.6*  HGB 8.8* 9.6* 9.4*  HCT 27.4* 29.1* 29.0*  PLT 36*  --  35*   BMET  Recent Labs  02/12/14 0908 02/13/14 0601  NA 143 142  K 4.2 4.3  CL 107 101  CO2 21 24  GLUCOSE 157* 183*  BUN 35* 34*  CREATININE 2.20* 2.37*  CALCIUM 8.4 8.4    Studies/Results: Dg Chest Portable 1 View  02/12/2014   CLINICAL DATA:  Chest pressure and shortness of breath since this morning; history of open heart surgery in 1996  EXAM: PORTABLE CHEST - 1 VIEW  COMPARISON:  Portable chest x-ray of December 07, 2013  FINDINGS: The lungs are well-expanded. The pulmonary interstitial markings remain increased. The pulmonary vascularity remains engorged but is not as prominent as on the earlier study. The cardiopericardial silhouette is top-normal in size. The mediastinum is not abnormally widened. There are post median sternotomy changes present with evidence of previous CABG.  IMPRESSION: Findings are consistent with congestive heart failure with pulmonary interstitial edema. The pulmonary vascular congestion is not quite as severe as on the previous study.    Electronically Signed   By: David  Martinique   On: 02/12/2014 09:04    Medications: I have reviewed the patient's current medications.  Assessment/Plan: 1. Progressive anemia with thrombocytopenia. S/P 3 units of PRBCs on 02/12/2014 and 1 unit of 02/13/2014. 2. Pancytopenia  3. MDS with 5Q- Syndrome, on Revlimid 5 mg daily. This will be changed to 5 mg daily 21 days on and 7 days off.  His 7 day break began yesterday, 02/13/2014. 4. CHF with pulmonary intersitial edema, per chest Xray on 5/19. 5. Constipation, will order MOM 6. From a hematology standpoint, he is stable and when his medical issues are stable, he is free for discharge which will be deferred to his attending physician.  Following D/C he is to follow-up with the [facility].  He has an appointment on 6/3.  He may hold his Revlimid until seen at the [facility].  Patient and plan discussed with Dr. Farrel Gobble and he is in agreement with the aforementioned.      LOS: 2 days    Baird Cancer 02/14/2014

## 2014-02-14 NOTE — Clinical Social Work Placement (Signed)
Clinical Social Work Department CLINICAL SOCIAL WORK PLACEMENT NOTE 02/14/2014  Patient:  [name], [name]  Account Number:  [id number] Admit date:  02/12/2014  Clinical Social Worker:  Benay Pike, LCSW  Date/time:  02/14/2014 10:47 AM  Clinical Social Work is seeking post-discharge placement for this patient at the following level of care:   [place]   (*CSW will update this form in Epic as items are completed)   02/14/2014  Patient/family provided with [place] Department of Clinical Social Work's list of facilities offering this level of care within the geographic area requested by the patient (or if unable, by the patient's family).  02/14/2014  Patient/family informed of their freedom to choose among providers that offer the needed level of care, that participate in Medicare, Medicaid or managed care program needed by the patient, have an available bed and are willing to accept the patient.  02/14/2014  Patient/family informed of [facility]' ownership interest in [facility], as well as of the fact that they are under no obligation to receive care at this facility.  PASARR submitted to [employer] on  PASARR number received from [place] on   FL2 transmitted to all facilities in geographic area requested by pt/family on  02/14/2014 FL2 transmitted to all facilities within larger geographic area on   Patient informed that his/her managed care company has contracts with or will negotiate with  certain facilities, including the following:     Patient/family informed of bed offers received:   Patient chooses bed at  Physician recommends and patient chooses bed at    Patient to be transferred to  on   Patient to be transferred to facility by   The following physician request were entered in Epic:   Additional Comments:  Benay Pike, [place]

## 2014-02-14 NOTE — Progress Notes (Addendum)
Subjective: He was complaining of chest pain when I went to see him this morning. He is somewhat confused. He says that he spent the night painting trees at my house. It was recommended that he have a skilled care facility again. He has known cardiac disease.  Objective: Vital signs in last 24 hours: Temp:  [97.4 F (36.3 C)-97.8 F (36.6 C)] 97.5 F (36.4 C) (05/21 0548) Pulse Rate:  [56-75] 74 (05/21 0822) Resp:  [20-22] 22 (05/21 0822) BP: (103-132)/(48-84) 108/50 mmHg (05/21 0822) SpO2:  [93 %-99 %] 98 % (05/21 0822) Weight change:  Last BM Date: 02/11/14  Intake/Output from previous day: 05/20 0701 - 05/21 0700 In: 1437.2 [P.O.:240; I.V.:934.7; Blood:262.5] Out: 3150 [Urine:3150]  PHYSICAL EXAM General appearance: alert, cooperative and Confused Resp: clear to auscultation bilaterally Cardio: regularly irregular rhythm GI: soft, non-tender; bowel sounds normal; no masses,  no organomegaly Extremities: extremities normal, atraumatic, no cyanosis or edema  Lab Results:    Basic Metabolic Panel:  Recent Labs  02/12/14 0908 02/13/14 0601  NA 143 142  K 4.2 4.3  CL 107 101  CO2 21 24  GLUCOSE 157* 183*  BUN 35* 34*  CREATININE 2.20* 2.37*  CALCIUM 8.4 8.4   Liver Function Tests:  Recent Labs  02/13/14 0601  AST 12  ALT 12  ALKPHOS 73  BILITOT 1.3*  PROT 6.4  ALBUMIN 3.1*   No results found for this basename: LIPASE, AMYLASE,  in the last 72 hours No results found for this basename: AMMONIA,  in the last 72 hours CBC:  Recent Labs  02/12/14 0908 02/13/14 0601 02/13/14 1759 02/14/14 0609  WBC 2.9* 3.5*  --  3.6*  NEUTROABS 1.2*  --   --  1.7  HGB 6.8* 8.8* 9.6* 9.4*  HCT 21.8* 27.4* 29.1* 29.0*  MCV 102.3* 97.2  --  95.7  PLT 36* 36*  --  35*   Cardiac Enzymes:  Recent Labs  02/12/14 0908  TROPONINI <0.30   BNP:  Recent Labs  02/12/14 1006  PROBNP 9708.0*   D-Dimer: No results found for this basename: DDIMER,  in the last 72  hours CBG: No results found for this basename: GLUCAP,  in the last 72 hours Hemoglobin A1C: No results found for this basename: HGBA1C,  in the last 72 hours Fasting Lipid Panel: No results found for this basename: CHOL, HDL, LDLCALC, TRIG, CHOLHDL, LDLDIRECT,  in the last 72 hours Thyroid Function Tests: No results found for this basename: TSH, T4TOTAL, FREET4, T3FREE, THYROIDAB,  in the last 72 hours Anemia Panel: No results found for this basename: VITAMINB12, FOLATE, FERRITIN, TIBC, IRON, RETICCTPCT,  in the last 72 hours Coagulation:  Recent Labs  02/12/14 1006  LABPROT 15.0  INR 1.21   Urine Drug Screen: Drugs of Abuse  No results found for this basename: labopia, cocainscrnur, labbenz, amphetmu, thcu, labbarb    Alcohol Level: No results found for this basename: ETH,  in the last 72 hours Urinalysis:  Recent Labs  02/12/14 0945  COLORURINE YELLOW  LABSPEC 1.025  PHURINE 5.5  GLUCOSEU 100*  HGBUR NEGATIVE  BILIRUBINUR NEGATIVE  KETONESUR NEGATIVE  PROTEINUR TRACE*  UROBILINOGEN 0.2  [place]. Labs:  ABGS No results found for this basename: PHART, PCO2, PO2ART, TCO2, HCO3,  in the last 72 hours CULTURES Recent Results (from the past 240 hour(s))  MRSA PCR SCREENING     Status: Abnormal   Collection Time    02/12/14 12:41  PM      Result Value Ref Range Status   MRSA by PCR POSITIVE (*) NEGATIVE Final   Comment:            The GeneXpert MRSA Assay (FDA     approved for NASAL specimens     only), is one component of a     comprehensive MRSA colonization     surveillance program. It is not     intended to diagnose MRSA     infection nor to guide or     monitor treatment for     MRSA infections.     RESULT CALLED TO, READ BACK BY AND VERIFIED WITH:     RODGERS S AT 1954 ON [id number] BY FORSYTH K   Studies/Results: Dg Chest Portable 1 View  02/12/2014   CLINICAL DATA:  Chest pressure and shortness of breath since  this morning; history of open heart surgery in 1996  EXAM: PORTABLE CHEST - 1 VIEW  COMPARISON:  Portable chest x-ray of December 07, 2013  FINDINGS: The lungs are well-expanded. The pulmonary interstitial markings remain increased. The pulmonary vascularity remains engorged but is not as prominent as on the earlier study. The cardiopericardial silhouette is top-normal in size. The mediastinum is not abnormally widened. There are post median sternotomy changes present with evidence of previous CABG.  IMPRESSION: Findings are consistent with congestive heart failure with pulmonary interstitial edema. The pulmonary vascular congestion is not quite as severe as on the previous study.   Electronically Signed   By: David  Martinique   On: 02/12/2014 09:04    Medications:  Prior to Admission:  Prescriptions prior to admission  Medication Sig Dispense Refill  . acetaminophen (TYLENOL) 500 MG tablet Take 500 mg by mouth every 6 (six) hours as needed for moderate pain.      Marland Kitchen albuterol (PROVENTIL HFA;VENTOLIN HFA) 108 (90 BASE) MCG/ACT inhaler Inhale 2 puffs into the lungs every 6 (six) hours as needed for wheezing.  1 Inhaler  2  . amiodarone (PACERONE) 200 MG tablet Take 0.5 tablets (100 mg total) by mouth daily.  30 tablet  5  . amitriptyline (ELAVIL) 10 MG tablet Take 30 mg by mouth at bedtime.      Marland Kitchen amLODipine (NORVASC) 5 MG tablet TAKE ONE TABLET BY MOUTH EVERY DAY FOR BLOOD PRESSURE  90 tablet  3  . aspirin 325 MG tablet Take 325 mg by mouth daily.      . diazepam (VALIUM) 5 MG tablet Take one tablet by mouth every 6 hours as needed for muscle spasms  120 tablet  5  . furosemide (LASIX) 40 MG tablet Take 1 tablet (40 mg total) by mouth daily. Take one half tablet daily on a routine basis. If your weight goes up by 3 pounds or if you have edema of your legs take one full tablet twice a day for 3 days  30 tablet  11  . HYDROcodone-acetaminophen (NORCO/VICODIN) 5-325 MG per tablet Take 1 by mouth every 6 hours  as needed for pain. *MAX APAP=3GM/24 HRS- ALL SOURCES*  120 tablet  0  . insulin aspart (NOVOLOG) 100 UNIT/ML injection Inject 0-15 Units into the skin every 4 (four) hours.  10 mL  11  . ipratropium-albuterol (DUONEB) 0.5-2.5 (3) MG/3ML SOLN Take 3 mLs by nebulization every 6 (six) hours as needed (shortness of breath).  360 mL  5  . isosorbide mononitrate (IMDUR) 30 MG 24 hr tablet Take 1 tablet (30 mg total) by  mouth daily.  30 tablet  12  . lenalidomide (REVLIMID) 5 MG capsule Take 1 capsule (5 mg total) by mouth daily.  28 capsule  0  . metFORMIN (GLUCOPHAGE) 500 MG tablet Take 500 mg by mouth 2 (two) times daily with a meal.      . metoprolol tartrate (LOPRESSOR) 25 MG tablet Take 25 mg by mouth 2 (two) times daily.        . Multiple Vitamin (MULTIVITAMIN) tablet Take 1 tablet by mouth daily.        . nitroGLYCERIN (NITROSTAT) 0.4 MG SL tablet Place 1 tablet (0.4 mg total) under the tongue every 5 (five) minutes x 3 doses as needed for chest pain (up to 3 doses).  25 tablet  3  . potassium chloride SA (K-DUR,KLOR-CON) 20 MEQ tablet Take 1 tablet (20 mEq total) by mouth daily.  30 tablet  12  . predniSONE (DELTASONE) 20 MG tablet Take 15 mg by mouth daily with breakfast.      . pregabalin (LYRICA) 75 MG capsule Take 1 capsule (75 mg total) by mouth daily.  30 capsule  5  . Red Yeast Rice 600 MG TABS Take 1 tablet by mouth daily.        . silodosin (RAPAFLO) 8 MG CAPS capsule Take 8 mg by mouth daily with breakfast.         Scheduled: . amiodarone  100 mg Oral Daily  . amitriptyline  30 mg Oral QHS  . amLODipine  5 mg Oral Daily  . antiseptic oral rinse  15 mL Mouth Rinse BID  . aspirin  325 mg Oral Daily  . Chlorhexidine Gluconate Cloth  6 each Topical Q0600  . furosemide  40 mg Oral Daily  . isosorbide mononitrate  30 mg Oral Daily  . metoprolol tartrate  25 mg Oral BID  . multivitamin with minerals  1 tablet Oral Daily  . mupirocin ointment  1 application Nasal BID  . potassium  chloride SA  20 mEq Oral Daily  . predniSONE  15 mg Oral Q breakfast  . pregabalin  75 mg Oral Daily  . tamsulosin  0.4 mg Oral QPC supper   Continuous: . sodium chloride 10 mL/hr at 02/12/14 1032   TIW:[id number], albuterol, diazepam, ipratropium-albuterol, nitroGLYCERIN  Assesment: He was admitted with anemia related to his myelodysplastic syndrome. He has been transfused and his hemoglobin level is adequate at this point. He developed congestive heart failure which has been a problem in the past when he has become significantly anemic. This morning he is confused. He is very weak. He is complaining of chest pain but it's hard to tell for sure about chest pain considering his confusion. However he does have a history of coronary artery occlusive disease and I think this will need to be treated and evaluated. He had 2 falls prior to admission but went to the emergency department and had workup that did not show any evidence of significant head injury Active Problems:   Anemia   Myelodysplastic syndrome with 5 q minus   CHF (congestive heart failure)   Exertional dyspnea   Chest pain of uncertain etiology    Plan: I will ask for both cardiology and neurology consultation. He will have nitroglycerin. He will have another chest x-ray to see if his CHF looks better. He will have EKG and troponin. He may need further imaging of his brain but I will ask neurology consult and about that    LOS: 2 days  [name] 02/14/2014, 8:39 AM

## 2014-02-14 NOTE — Progress Notes (Signed)
Patient c/o pain 3/10 to chest. Dr. Luan Pulling aware. Cardio consulted. Vital signs stable. EKG, Trop, and Nitro Sl done.

## 2014-02-14 NOTE — Care Management Note (Addendum)
    Page 1 of 1   02/15/2014     11:02:48 AM CARE MANAGEMENT NOTE 02/15/2014  Patient:  [name], [name]   Account Number:  [id number]  Date Initiated:  02/14/2014  Documentation initiated by:  Claretha Cooper  Subjective/Objective Assessment:   Pt from home with wife and has fallen several times on their hardwood floors. Pt is active with [facility] for RN and PT services. PT has recommended SNF     Action/Plan:   Anticipated DC Date:  02/15/2014   Anticipated DC Plan:  [place]  In-house referral  Clinical Social Worker      DC Planning Services  CM consult      Choice offered to / List presented to:             [place].   Status of service:  Completed, signed off Medicare Important Message given?  YES (If response is "NO", the following Medicare IM given date fields will be blank) Date Medicare IM given:  02/15/2014 Date Additional Medicare IM given:    Discharge Disposition:  [place]  Per UR Regulation:    If discussed at Long Length of Stay Meetings, dates discussed:    Comments:  02/14/14 Claretha Cooper RN BSN CM Spoke to wife who originally wanted pt placed in [place]. Wife is partially blind and unable to drive. Did not want to inconvience whoever would transport her to be with pt. Since it appears that no SNF bed will be open in [place], wife requested CSW to talk with pt and see if he would be willing to consider [facility] SNFs. CSW notified.

## 2014-02-14 NOTE — Consult Note (Signed)
Reason for Consult:chest pain Referring Physician: Alexios Keown is an 80 y.o. male.  HPI: This is an 80 yr old male patient of Dr. Harrington Challenger who was admitted with anemia related to myelodysplastic syndrome we were asked to see for chest pain. He also developed CHF after transfusions. He also developed confusion this am and neuro will see. He had CT on admission after several falls at home. Hbg 6.8 on admission transfused to 9.4. Troponins all negative.EKG today shows NSR with poor R wave progression and Nonspecific ST T changes. Prior EKG's show Atrial fibrillation. No acute changes on any. BNP 9708 on admission.  He has history of CAD S/P CABG in 1996, myoview 2010-no ischemia, Atrial fibrillation on Amiodarone(Coumadin stopped because of GI bleed in 2013), HTN, dyslipidemia, PVD. 2Decho 11/29/13 EF 55-60%(improved from 45-50%) with Grade 2 diastolic dysfunction, moderate AS. Last seen by [place] in hospital 11/2013 with CHF.   Patient seems more lucid at this time. He says he awakened at 4 am with an elephant on his chest relieved with NTG and breathing treatment according to patient. He rarely has chest pain at home and hasn't had much trouble with CHF. Currently pain free.   Past Medical History  Diagnosis Date  . Essential hypertension, benign   . COPD (chronic obstructive pulmonary disease)   . Coronary atherosclerosis of native coronary artery     Multivessel status post CABG 1996  . Arthritis   . Atrial fibrillation     Coumadin stopped 12/2011 due to anemia  . Dysphagia   . Anemia     Requiring blood transfusions  . Carotid stenosis     Dr Scot Dock   . Diabetes mellitus, type 2   . Mixed hyperlipidemia     Statin intolerant  . Hematuria     Felt related to Foley insertion  . Cardiomyopathy     LVEF 45-50%  . Melena     EGD & Colonoscopy 09/2011 revealed gastritis, erosions, scars, diverticula, 8 colon polyps (largest 1.6cm, not removed 2/2 anticoagulation), small AVM in  transverse colon  . History of tobacco abuse     Quit 1992  . Anxiety   . GERD (gastroesophageal reflux disease)   . Myocardial infarction   . Thrombocytopenia   . Myelodysplastic syndrome with 5 q minus 10/17/2012    On Revlimid 5 mg daily 21 days on and 7 days off  . Aortic stenosis     Mild  . CKD (chronic kidney disease) stage 3, GFR 30-59 ml/min   . Pneumonia 11/2013    history of    Past Surgical History  Procedure Laterality Date  . Knee surgery  1960    Right knee cartilage  . Rotator cuff repair  1990    right   . Inguinal hernia repair  2000 and 2010    left  . Cholecystectomy  05/2010    Lap chole with biologic mesh repair/reinforcement by  Dr Rise Patience  . Back surgery  02/2006    ruptured disk,  post op diskitis infection requiring prolonged hospital stay and  surgical I & D  . Tonsillectomy    . Esophagogastroduodenoscopy  10/08/2011    Procedure: ESOPHAGOGASTRODUODENOSCOPY (EGD);  Surgeon: [name], MD;  Location: [facility];  Service: Endoscopy;  Laterality: N/A;  . Colonoscopy  10/08/2011    Procedure: COLONOSCOPY;  Surgeon: [name], MD;  Location: [facility];  Service: Endoscopy;  Laterality: N/A;  . Cataract extraction, bilateral    .  Peg placement  07/2006    placed due to prolonged infection, poor po intake, malnutrition during several week hospital stay resulting from ruptured disc that became infected following surgery  . Colonoscopy  01/14/2012    Procedure: COLONOSCOPY;  Surgeon: [name], MD;  Location: [facility];  Service: Endoscopy;  Laterality: N/A;  945  . Coronary artery bypass graft  1996    CABG x 4  . Ventral hernia repair    . Colonoscopy  04/20/2012    Procedure: COLONOSCOPY;  Surgeon: [name], MD;  Location: [facility];  Service: Endoscopy;  Laterality: N/A;  1030  . Pr vein bypass graft,aorto-fem-pop  1996  . Spine surgery    . Joint replacement  1960    Right Knee  . Lymph node biopsy  08/15/2012     Procedure: LYMPH NODE BIOPSY;  Surgeon: Scherry Ran, MD;  Location: [facility];  Service: General;  Laterality: Left;  Cervical Lymph Node Bx in Minor Room  . Bone marrow biopsy    . Bone marrow aspiration      Family History  Problem Relation Age of Onset  . Heart disease Sister   . Cancer Sister   . Hyperlipidemia Sister   . Hypertension Sister   . Heart disease Brother     Heart Diseast before age 67  . Coronary artery disease Mother   . Cancer Father     Stomach  . Anesthesia problems Neg Hx   . Hypotension Neg Hx   . Malignant hyperthermia Neg Hx   . Pseudochol deficiency Neg Hx     Social History:  reports that he quit smoking about 23 years ago. His smoking use included Cigarettes. He has a 80 pack-year smoking history. He has never used smokeless tobacco. He reports that he does not drink alcohol or use illicit drugs.  Allergies:  Allergies  Allergen Reactions  . Statins Other (See Comments)    Causes legs to hurt.   . Tape     Causes skin to peel off.     Medications: Scheduled Meds: . amiodarone  100 mg Oral Daily  . amitriptyline  30 mg Oral QHS  . amLODipine  5 mg Oral Daily  . antiseptic oral rinse  15 mL Mouth Rinse BID  . aspirin  325 mg Oral Daily  . Chlorhexidine Gluconate Cloth  6 each Topical Q0600  . furosemide  40 mg Oral Daily  . isosorbide mononitrate  30 mg Oral Daily  . magnesium hydroxide  30 mL Oral Once  . metoprolol tartrate  25 mg Oral BID  . multivitamin with minerals  1 tablet Oral Daily  . mupirocin ointment  1 application Nasal BID  . potassium chloride SA  20 mEq Oral Daily  . predniSONE  15 mg Oral Q breakfast  . pregabalin  75 mg Oral Daily  . tamsulosin  0.4 mg Oral QPC supper   Continuous Infusions: . sodium chloride 10 mL/hr at 02/12/14 1032   PRN Meds:.acetaminophen, albuterol, diazepam, ipratropium-albuterol, nitroGLYCERIN   Results for orders placed during the hospital encounter of 02/12/14 (from the past 48  hour(s))  CBC WITH DIFFERENTIAL     Status: Abnormal   Collection Time    02/12/14  9:08 AM      Result Value Ref Range   WBC 2.9 (*) 4.0 - 10.5 K/uL   RBC 2.13 (*) 4.22 - 5.81 MIL/uL   Hemoglobin 6.8 (*) 13.0 - 17.0 g/dL  Comment: EPITHELIAL CASTS     CRITICAL RESULT CALLED TO, READ BACK BY AND VERIFIED WITH:     TIFFANY VOGLER RN ON [id number] AT 0930 BY RESSEGGER R   HCT 21.8 (*) 39.0 - 52.0 %   MCV 102.3 (*) 78.0 - 100.0 fL   MCH 31.9  26.0 - 34.0 pg   MCHC 31.2  30.0 - 36.0 g/dL   RDW 22.6 (*) 11.5 - 15.5 %   Platelets 36 (*) 150 - 400 K/uL   Comment: SPECIMEN CHECKED FOR CLOTS     PLATELET COUNT CONFIRMED BY SMEAR     CALLED TO TIFFANY VOGLER RN ON [id number] AT 0930 BY RESSGEER R    Neutrophils Relative % 43  43 - 77 %   Neutro Abs 1.2 (*) 1.7 - 7.7 K/uL   Lymphocytes Relative 38  12 - 46 %   Lymphs Abs 1.1  0.7 - 4.0 K/uL   Monocytes Relative 5  3 - 12 %   Monocytes Absolute 0.1  0.1 - 1.0 K/uL   Eosinophils Relative 10 (*) 0 - 5 %   Eosinophils Absolute 0.3  0.0 - 0.7 K/uL   Basophils Relative 5 (*) 0 - 1 %   Basophils Absolute 0.1  0.0 - 0.1 K/uL   Smear Review PLATELETS APPEAR DECREASED    BASIC METABOLIC PANEL     Status: Abnormal   Collection Time    02/12/14  9:08 AM      Result Value Ref Range   Sodium 143  137 - 147 mEq/L   Potassium 4.2  3.7 - 5.3 mEq/L   Chloride 107  96 - 112 mEq/L   CO2 21  19 - 32 mEq/L   Glucose, Bld 157 (*) 70 - 99 mg/dL   BUN 35 (*) 6 - 23 mg/dL   Creatinine, Ser 2.20 (*) 0.50 - 1.35 mg/dL   Calcium 8.4  8.4 - 10.5 mg/dL   GFR calc non Af Amer 27 (*) >90 mL/min   GFR calc Af Amer 31 (*) >90 mL/min   Comment: (NOTE)     The eGFR has been calculated using the CKD EPI equation.     This calculation has not been validated in all clinical situations.     eGFR's persistently <90 mL/min signify possible Chronic Kidney     Disease.  TROPONIN I     Status: None   Collection Time    02/12/14  9:08 AM      Result Value Ref Range   Troponin  I <0.30  <0.30 ng/mL   Comment:            Due to the release kinetics of cTnI,     a negative result within the first hours     of the onset of symptoms does not rule out     myocardial infarction with certainty.     If myocardial infarction is still suspected,     repeat the test at appropriate intervals.  URINALYSIS, ROUTINE W REFLEX MICROSCOPIC     Status: Abnormal   Collection Time    02/12/14  9:45 AM      Result Value Ref Range   Color, Urine YELLOW  YELLOW   APPearance CLEAR  CLEAR   Specific Gravity, Urine 1.025  1.005 - 1.030   pH 5.5  5.0 - 8.0   Glucose, UA 100 (*) NEGATIVE mg/dL   Hgb urine dipstick NEGATIVE  NEGATIVE   Bilirubin Urine NEGATIVE  NEGATIVE  Ketones, ur NEGATIVE  NEGATIVE mg/dL   Protein, ur TRACE (*) NEGATIVE mg/dL   Urobilinogen, UA 0.2  0.0 - 1.0 mg/dL   Nitrite NEGATIVE  NEGATIVE   Leukocytes, UA NEGATIVE  NEGATIVE  URINE MICROSCOPIC-ADD ON     Status: None   Collection Time    02/12/14  9:45 AM      Result Value Ref Range   WBC, UA 0-2  <3 WBC/hpf  PROTIME-INR     Status: None   Collection Time    02/12/14 10:06 AM      Result Value Ref Range   Prothrombin Time 15.0  11.6 - 15.2 seconds   INR 1.21  0.00 - 1.49  PRO B NATRIURETIC PEPTIDE     Status: Abnormal   Collection Time    02/12/14 10:06 AM      Result Value Ref Range   Pro B Natriuretic peptide (BNP) 9708.0 (*) 0 - 450 pg/mL  TYPE AND SCREEN     Status: None   Collection Time    02/12/14 10:07 AM      Result Value Ref Range   ABO/RH(D) A NEG     Antibody Screen NEG     Sample Expiration 02/15/2014     Unit Number [id number]     Blood Component Type RBC, LR IRR     Unit division 00     Status of Unit ISSUED,FINAL     Transfusion Status OK TO TRANSFUSE     Crossmatch Result Compatible     Unit Number [id number]     Blood Component Type RBC, LR IRR     Unit division 00     Status of Unit ISSUED,FINAL     Transfusion Status OK TO TRANSFUSE     Crossmatch Result  Compatible     Unit Number [id number]     Blood Component Type RBC, LR IRR     Unit division 00     Status of Unit ISSUED     Transfusion Status OK TO TRANSFUSE     Crossmatch Result Compatible    PREPARE RBC (CROSSMATCH)     Status: None   Collection Time    02/12/14 10:07 AM      Result Value Ref Range   Order Confirmation ORDER PROCESSED BY BLOOD BANK    PREPARE RBC (CROSSMATCH)     Status: None   Collection Time    02/12/14 10:07 AM      Result Value Ref Range   Order Confirmation ORDER PROCESSED BY BLOOD BANK    MRSA PCR SCREENING     Status: Abnormal   Collection Time    02/12/14 12:41 PM      Result Value Ref Range   MRSA by PCR POSITIVE (*) NEGATIVE   Comment:            The GeneXpert MRSA Assay (FDA     approved for NASAL specimens     only), is one component of a     comprehensive MRSA colonization     surveillance program. It is not     intended to diagnose MRSA     infection nor to guide or     monitor treatment for     MRSA infections.     RESULT CALLED TO, READ BACK BY AND VERIFIED WITH:     RODGERS S AT 1956 ON [id number] BY FORSYTH K  CBC     Status: Abnormal   Collection Time    02/13/14  6:01 AM      Result Value Ref Range   WBC 3.5 (*) 4.0 - 10.5 K/uL   RBC 2.82 (*) 4.22 - 5.81 MIL/uL   Hemoglobin 8.8 (*) 13.0 - 17.0 g/dL   HCT 27.4 (*) 39.0 - 52.0 %   MCV 97.2  78.0 - 100.0 fL   MCH 31.2  26.0 - 34.0 pg   MCHC 32.1  30.0 - 36.0 g/dL   RDW 23.5 (*) 11.5 - 15.5 %   Platelets 36 (*) 150 - 400 K/uL   Comment: SPECIMEN CHECKED FOR CLOTS     PLATELET COUNT CONFIRMED BY SMEAR  COMPREHENSIVE METABOLIC PANEL     Status: Abnormal   Collection Time    02/13/14  6:01 AM      Result Value Ref Range   Sodium 142  137 - 147 mEq/L   Potassium 4.3  3.7 - 5.3 mEq/L   Chloride 101  96 - 112 mEq/L   CO2 24  19 - 32 mEq/L   Glucose, Bld 183 (*) 70 - 99 mg/dL   BUN 34 (*) 6 - 23 mg/dL   Creatinine, Ser 2.37 (*) 0.50 - 1.35 mg/dL   Calcium 8.4  8.4 - 10.5 mg/dL    Total Protein 6.4  6.0 - 8.3 g/dL   Albumin 3.1 (*) 3.5 - 5.2 g/dL   AST 12  0 - 37 U/L   ALT 12  0 - 53 U/L   Alkaline Phosphatase 73  39 - 117 U/L   Total Bilirubin 1.3 (*) 0.3 - 1.2 mg/dL   GFR calc non Af Amer 24 (*) >90 mL/min   GFR calc Af Amer 28 (*) >90 mL/min   Comment: (NOTE)     The eGFR has been calculated using the CKD EPI equation.     This calculation has not been validated in all clinical situations.     eGFR's persistently <90 mL/min signify possible Chronic Kidney     Disease.  HEMOGLOBIN AND HEMATOCRIT, BLOOD     Status: Abnormal   Collection Time    02/13/14  5:59 PM      Result Value Ref Range   Hemoglobin 9.6 (*) 13.0 - 17.0 g/dL   HCT 29.1 (*) 39.0 - 52.0 %  CBC WITH DIFFERENTIAL     Status: Abnormal   Collection Time    02/14/14  6:09 AM      Result Value Ref Range   WBC 3.6 (*) 4.0 - 10.5 K/uL   RBC 3.03 (*) 4.22 - 5.81 MIL/uL   Hemoglobin 9.4 (*) 13.0 - 17.0 g/dL   HCT 29.0 (*) 39.0 - 52.0 %   MCV 95.7  78.0 - 100.0 fL   MCH 31.0  26.0 - 34.0 pg   MCHC 32.4  30.0 - 36.0 g/dL   RDW 21.7 (*) 11.5 - 15.5 %   Platelets 35 (*) 150 - 400 K/uL   Comment: SPECIMEN CHECKED FOR CLOTS     CONSISTENT WITH PREVIOUS RESULT   Neutrophils Relative % 48  43 - 77 %   Neutro Abs 1.7  1.7 - 7.7 K/uL   Lymphocytes Relative 41  12 - 46 %   Lymphs Abs 1.5  0.7 - 4.0 K/uL   Monocytes Relative 5  3 - 12 %   Monocytes Absolute 0.2  0.1 - 1.0 K/uL   Eosinophils Relative 4  0 - 5 %   Eosinophils Absolute 0.1  0.0 - 0.7 K/uL   Basophils Relative  2 (*) 0 - 1 %   Basophils Absolute 0.1  0.0 - 0.1 K/uL   Smear Review PLATELETS APPEAR DECREASED    BASIC METABOLIC PANEL     Status: Abnormal   Collection Time    02/14/14  6:09 AM      Result Value Ref Range   Sodium 139  137 - 147 mEq/L   Potassium 3.9  3.7 - 5.3 mEq/L   Chloride 98  96 - 112 mEq/L   CO2 26  19 - 32 mEq/L   Glucose, Bld 176 (*) 70 - 99 mg/dL   BUN 44 (*) 6 - 23 mg/dL   Creatinine, Ser 2.28 (*) 0.50 -  1.35 mg/dL   Calcium 8.7  8.4 - 10.5 mg/dL   GFR calc non Af Amer 25 (*) >90 mL/min   GFR calc Af Amer 29 (*) >90 mL/min   Comment: (NOTE)     The eGFR has been calculated using the CKD EPI equation.     This calculation has not been validated in all clinical situations.     eGFR's persistently <90 mL/min signify possible Chronic Kidney     Disease.  TROPONIN I     Status: None   Collection Time    02/14/14  6:09 AM      Result Value Ref Range   Troponin I <0.30  <0.30 ng/mL   Comment:            Due to the release kinetics of cTnI,     a negative result within the first hours     of the onset of symptoms does not rule out     myocardial infarction with certainty.     If myocardial infarction is still suspected,     repeat the test at appropriate intervals.    No results found.  ROS See HPI Eyes: Negative Ears:Negative for hearing loss, tinnitus Cardiovascular: positive for chest pain, negaitve palpitations,irregular heartbeat,  near-syncope, orthopnea, paroxysmal nocturnal dyspnea and syncope,edema, claudication, cyanosis,.  Respiratory:   Negative for cough, hemoptysis, , sleep disturbances due to breathing, sputum production and wheezing.   Endocrine: Negative for cold intolerance and heat intolerance.  Hematologic/Lymphatic:chronic anemia  Musculoskeletal: Negative.   Gastrointestinal: Negative for nausea, vomiting, reflux, abdominal pain, diarrhea, constipation.   Genitourinary: Negative for bladder incontinence, dysuria, flank pain, frequency, hematuria, hesitancy, nocturia and urgency.  Neurological: some confusion earlier, but seems lucid at this time.  Allergic/Immunologic: Negative for environmental allergies.  Blood pressure 108/50, pulse 74, temperature 97.5 F (36.4 C), temperature source Oral, resp. rate 22, height 5' 11.5" (1.816 m), weight 180 lb 9.6 oz (81.92 kg), SpO2 98.00%. Physical Exam PHYSICAL EXAM: Well-nournished, in no acute distress. Neck:  Increased JVD, HJR,  positve Bruit vs murmur portrayed., no thyroid enlargement Lungs: Bibasilar rales Cardiovascular: RRR, PMI not displaced, 2/6 harsh systolic murmur LSB, no gallops, bruit, thrill, or heave. Abdomen: BS normal. Soft without organomegaly, masses, lesions or tenderness. Extremities: without cyanosis, clubbing or edema. Good distal pulses bilateral SKin: Warm, no lesions or rashes  Musculoskeletal: No deformities Neuro: no focal signs  CT head: IMPRESSION: No acute intracranial findings.  Tiny old lacunar infarct over the right caudate nucleus.  Minimal chronic sinus inflammatory disease.   Electronically Signed   By: Marin Olp M.D.   On: 02/07/2014 16:40  2Decho 11/28/13: Study Conclusions  - Left ventricle: The cavity size was normal. Wall thickness   was increased in a pattern of moderate LVH. Systolic  function was normal. The estimated ejection fraction was   in the range of 55% to 60%. There is hypokinesis of the   basalinferior myocardium. Features are consistent with a   pseudonormal left ventricular filling pattern, with   concomitant abnormal relaxation and increased filling   pressure (grade 2 diastolic dysfunction). - Aortic valve: Mildly calcified annulus. Trileaflet;   moderately calcified leaflets. There was moderate   stenosis. Mean gradient: 39m Hg (S). Peak gradient: 353m  Hg (S). Valve area: 1.02cm^2(VTI). LVOT/AV VTI ratio 0.29. - Mitral valve: Calcified annulus. Mildly thickened leaflets   . Mild regurgitation. - Left atrium: The atrium was moderately dilated. - Right ventricle: The cavity size was mildly dilated. - Right atrium: The atrium was moderately dilated. Central   venous pressure: 65m1mg (est). - Atrial septum: No defect or patent foramen ovale was   identified. - Tricuspid valve: Mild regurgitation. - Pulmonary arteries: PA peak pressure: 465m51m (S). - Pericardium, extracardiac: There was no pericardial    effusion. Impressions:  - Moderate LVH with LVEF 55-60%, basal inferior hypokinesis,   grade 2 diastolic dysfunction. Moderate biatrial   enlargement. MAC with thickened mitral valve and mild   mitral regurgitation. Overall moderate aortic valve   stenosis as described above. Mild tricuspid regurgitation   with moderately elevated PASP of 48 mmHg. mercury.   Assessment/Plan:  Anemia: related to myelodysplastic syndrome S/P transfusion  Chest pain in setting of anemia, worrisome for ischemia in setting of CAD, but negative Troponin's and EKG without acute change. Will increase Imdur 60 mg. Consider Lexiscan myoview once stable.  CAD S/P CABG in 1996, myoview 2010-no ischemia  Chronic Diastolic CHF  EF 55-615-87%h Grade 2 Diastolic Dysfunction. Patient seems to be in heart failure now Diuresed 2844cc and 1712cc passed 2 days on Lasix 40mg74m Moderate Aortic stenois on 2 Decho 11/2013, Mean gradient: 15mm 365mS). Peak gradient: 31mm  21m(S). Valve area: 1.02cm^2(VTI)  Paroxysmal Atrial Fibrillation: on Amiodarone. Rate controlled, in NSR this am. No Coumadin since GI bleed 2013  PVD: 60-79% RICA stenosis 05/2013 followed by Dr. DicksonScot Dock Dyslipidemia   Confusion: neuro to see  MicheleImogene [id number], 9:01 AM

## 2014-02-14 NOTE — Clinical Social Work Psychosocial (Signed)
Clinical Social Work Department BRIEF PSYCHOSOCIAL ASSESSMENT 02/14/2014  Patient:  [name], [name]     Account Number:  [id number]     Admit date:  02/12/2014  Clinical Social Worker:  Wyatt Haste  Date/Time:  02/14/2014 11:17 AM  Referred by:  Physician  Date Referred:  02/14/2014 Referred for  SNF Placement   Other Referral:   Interview type:  Family Other interview type:   wife    PSYCHOSOCIAL DATA Living Status:  WIFE Admitted from facility:   Level of care:   Primary support name:  Delores Primary support relationship to patient:  SPOUSE Degree of support available:   supportive    CURRENT CONCERNS Current Concerns  Post-Acute Placement   Other Concerns:    SOCIAL WORK ASSESSMENT / PLAN CSW spoke with pt's wife as pt has been confused this morning. She reports pt has been at home from [facility] since the beginning of April. He had not finished therapy at that point, but desired to return home because his wife had been sick. Since his return home, wife indicates he has been doing fairly well. Recently though he has had a few falls and required more help than usual. Pt uses a walker at baseline. He was still driving. Pt's confusion is new according to his wife as he is usually oriented. Pt's granddaughter [name] is best support and helps them out a lot. Pt evaluated by PT today and recommendation is for SNF. CSW discussed placement process again. She is aware that beds are very tight in [place] right now. CSW asked if she would be open to looking in [place] or elsewhere and she states if nothing is available in [place] then she will take pt home. SNF list left in room. Pt's wife reports pt will want to be with her because she has been sick, but she feels at this point it is best to look at rehab for him.   Assessment/plan status:  Psychosocial Support/Ongoing Assessment of Needs Other assessment/ plan:   Information/referral to community resources:   SNF list     PATIENT'S/FAMILY'S RESPONSE TO PLAN OF CARE: Pt unable to discuss plan of care at this time. Pt's wife requests CSW initiate bed search in [place] only. CSW will follow up.       Benay Pike, [place]

## 2014-02-14 NOTE — Consult Note (Signed)
Caleb A. Merlene Laughter, MD     [email or web address]          [name] is an 80 y.o. male.   ASSESSMENT/PLAN: 1. Acute encephalopathy most likely due to acute medical illness. This has improved. We'll continue to follow patient. Additional labs will be obtained for causes of cognitive impairment.  2. Gait impairment most likely multifactorial including arthritic changes and likely diabetic neuropathy. Medication and age also plays a role. We have advised patient to be compliant with his assisted devices including a walker and cane. No treatable neurological causes of uncovered.  This is an 80 year old white male who presents with global weakness and was noted to have a drop in his hemoglobin. He has a history of chronic anemia requiring transfusion. His hemoglobin was 6.8 on admission. He has received to transfusion with improvement in his weakness. The patient got 3 units of packed RBCs. Patient has had a few falls recently. Most of these occurred with the patient not using his assisted devices. The patient denies any lightheadedness or dizziness with these falls. No focal neurological problems are reported. Review of systems is otherwise negative.  GENERAL: Pleasant in no acute distress.  HEENT: Supple. Atraumatic normocephalic.   ABDOMEN: soft  EXTREMITIES: No edema. Several areas of bruising involving the upper extremity status post fall. Also bruising involving the left knee.   BACK: Normal.  SKIN: Normal by inspection.    MENTAL STATUS: Alert and oriented - Person, place year and month. Speech, language and cognition are generally intact. Judgment and insight normal.   CRANIAL NERVES: Pupils are equal, round and reactive to light and accommodation; extra ocular movements are full, there is no significant nystagmus; visual fields are full; upper and lower facial muscles are normal in strength and symmetric, there is no flattening of the nasolabial folds;  tongue is midline; uvula is midline; shoulder elevation is normal.  MOTOR: Normal tone, bulk and strength- Except left dorsiflexion which is about 4+/5 and seemed to have reduced range of motion; no pronator drift.  COORDINATION: Left finger to nose is normal, right finger to nose is normal, No rest tremor; no intention tremor; Moderate frequency moderate amplitude postural tremor - Left upper exremity; no bradykinesia.  REFLEXES: Deep tendon reflexes are symmetrical and normal. Babinski reflexes are flexor bilaterally.   SENSATION: Normal to light touch.      Past Medical History  Diagnosis Date  . Essential hypertension, benign   . COPD (chronic obstructive pulmonary disease)   . Coronary atherosclerosis of native coronary artery     Multivessel status post CABG 1996  . Arthritis   . Atrial fibrillation     Coumadin stopped 12/2011 due to anemia  . Dysphagia   . Anemia     Requiring blood transfusions  . Carotid stenosis     Dr Scot Dock   . Diabetes mellitus, type 2   . Mixed hyperlipidemia     Statin intolerant  . Hematuria     Felt related to Foley insertion  . Cardiomyopathy     LVEF 45-50%  . Melena     EGD & Colonoscopy 09/2011 revealed gastritis, erosions, scars, diverticula, 8 colon polyps (largest 1.6cm, not removed 2/2 anticoagulation), small AVM in transverse colon  . History of tobacco abuse     Quit 1992  . Anxiety   . GERD (gastroesophageal reflux disease)   . Myocardial infarction   . Thrombocytopenia   . Myelodysplastic syndrome with 5 q minus 10/17/2012  On Revlimid 5 mg daily 21 days on and 7 days off  . Aortic stenosis     Mild  . CKD (chronic kidney disease) stage 3, GFR 30-59 ml/min   . Pneumonia 11/2013    history of    Past Surgical History  Procedure Laterality Date  . Knee surgery  1960    Right knee cartilage  . Rotator cuff repair  1990    right   . Inguinal hernia repair  2000 and 2010    left  . Cholecystectomy  05/2010    Lap  chole with biologic mesh repair/reinforcement by  Dr Rise Patience  . Back surgery  02/2006    ruptured disk,  post op diskitis infection requiring prolonged hospital stay and  surgical I & D  . Tonsillectomy    . Esophagogastroduodenoscopy  10/08/2011    Procedure: ESOPHAGOGASTRODUODENOSCOPY (EGD);  Surgeon: [name], MD;  Location: [facility];  Service: Endoscopy;  Laterality: N/A;  . Colonoscopy  10/08/2011    Procedure: COLONOSCOPY;  Surgeon: [name], MD;  Location: [facility];  Service: Endoscopy;  Laterality: N/A;  . Cataract extraction, bilateral    . Peg placement  07/2006    placed due to prolonged infection, poor po intake, malnutrition during several week hospital stay resulting from ruptured disc that became infected following surgery  . Colonoscopy  01/14/2012    Procedure: COLONOSCOPY;  Surgeon: [name], MD;  Location: [facility];  Service: Endoscopy;  Laterality: N/A;  945  . Coronary artery bypass graft  1996    CABG x 4  . Ventral hernia repair    . Colonoscopy  04/20/2012    Procedure: COLONOSCOPY;  Surgeon: [name], MD;  Location: [facility];  Service: Endoscopy;  Laterality: N/A;  1030  . Pr vein bypass graft,aorto-fem-pop  1996  . Spine surgery    . Joint replacement  1960    Right Knee  . Lymph node biopsy  08/15/2012    Procedure: LYMPH NODE BIOPSY;  Surgeon: Scherry Ran, MD;  Location: [facility];  Service: General;  Laterality: Left;  Cervical Lymph Node Bx in Minor Room  . Bone marrow biopsy    . Bone marrow aspiration      Family History  Problem Relation Age of Onset  . Heart disease Sister   . Cancer Sister   . Hyperlipidemia Sister   . Hypertension Sister   . Heart disease Brother     Heart Diseast before age 10  . Coronary artery disease Mother   . Cancer Father     Stomach  . Anesthesia problems Neg Hx   . Hypotension Neg Hx   . Malignant hyperthermia Neg Hx   . Pseudochol deficiency Neg Hx     Social  History:  reports that he quit smoking about 23 years ago. His smoking use included Cigarettes. He has a 80 pack-year smoking history. He has never used smokeless tobacco. He reports that he does not drink alcohol or use illicit drugs.  Allergies:  Allergies  Allergen Reactions  . Statins Other (See Comments)    Causes legs to hurt.   . Tape     Causes skin to peel off.     Medications: Prior to Admission medications   Medication Sig Start Date End Date Taking? Authorizing Provider  acetaminophen (TYLENOL) 500 MG tablet Take 500 mg by mouth every 6 (six) hours as needed for moderate pain.   Yes  Historical Provider, MD  albuterol (PROVENTIL HFA;VENTOLIN HFA) 108 (90 BASE) MCG/ACT inhaler Inhale 2 puffs into the lungs every 6 (six) hours as needed for wheezing. 01/08/13  Yes [name], MD  amiodarone (PACERONE) 200 MG tablet Take 0.5 tablets (100 mg total) by mouth daily. 10/08/11  Yes [name], MD  amitriptyline (ELAVIL) 10 MG tablet Take 30 mg by mouth at bedtime.   Yes Historical Provider, MD  amLODipine (NORVASC) 5 MG tablet TAKE ONE TABLET BY MOUTH EVERY DAY FOR BLOOD PRESSURE 02/26/13  Yes Fay Records, MD  aspirin 325 MG tablet Take 325 mg by mouth daily.   Yes Historical Provider, MD  diazepam (VALIUM) 5 MG tablet Take one tablet by mouth every 6 hours as needed for muscle spasms 12/10/13  Yes Tiffany L Reed, DO  furosemide (LASIX) 40 MG tablet Take 1 tablet (40 mg total) by mouth daily. Take one half tablet daily on a routine basis. If your weight goes up by 3 pounds or if you have edema of your legs take one full tablet twice a day for 3 days 01/08/13  Yes [name], MD  HYDROcodone-acetaminophen (NORCO/VICODIN) 5-325 MG per tablet Take 1 by mouth every 6 hours as needed for pain. *MAX APAP=3GM/24 HRS- ALL SOURCES* 12/20/13  Yes Tiffany L Reed, DO  insulin aspart (NOVOLOG) 100 UNIT/ML injection Inject 0-15 Units into the skin every 4 (four) hours. 12/08/13  Yes [name], MD  ipratropium-albuterol (DUONEB) 0.5-2.5 (3) MG/3ML SOLN Take 3 mLs by nebulization every 6 (six) hours as needed (shortness of breath). 12/08/13  Yes [name], MD  isosorbide mononitrate (IMDUR) 30 MG 24 hr tablet Take 1 tablet (30 mg total) by mouth daily. 12/08/13  Yes [name], MD  lenalidomide (REVLIMID) 5 MG capsule Take 1 capsule (5 mg total) by mouth daily. 01/08/14  Yes Manon Hilding Kefalas, PA-C  metFORMIN (GLUCOPHAGE) 500 MG tablet Take 500 mg by mouth 2 (two) times daily with a meal.   Yes Historical Provider, MD  metoprolol tartrate (LOPRESSOR) 25 MG tablet Take 25 mg by mouth 2 (two) times daily.     Yes Historical Provider, MD  Multiple Vitamin (MULTIVITAMIN) tablet Take 1 tablet by mouth daily.     Yes Historical Provider, MD  nitroGLYCERIN (NITROSTAT) 0.4 MG SL tablet Place 1 tablet (0.4 mg total) under the tongue every 5 (five) minutes x 3 doses as needed for chest pain (up to 3 doses). 01/08/13  Yes [name], MD  potassium chloride SA (K-DUR,KLOR-CON) 20 MEQ tablet Take 1 tablet (20 mEq total) by mouth daily. 12/08/13  Yes [name], MD  predniSONE (DELTASONE) 20 MG tablet Take 15 mg by mouth daily with breakfast. 12/08/13  Yes [name], MD  pregabalin (LYRICA) 75 MG capsule Take 1 capsule (75 mg total) by mouth daily. 12/18/13  Yes Estill Dooms, MD  Red Yeast Rice 600 MG TABS Take 1 tablet by mouth daily.     Yes Historical Provider, MD  silodosin (RAPAFLO) 8 MG CAPS capsule Take 8 mg by mouth daily with breakfast.     Yes Historical Provider, MD    Scheduled Meds: . amiodarone  100 mg Oral Daily  . amitriptyline  30 mg Oral QHS  . amLODipine  5 mg Oral Daily  . antiseptic oral rinse  15 mL Mouth Rinse BID  . aspirin  325 mg Oral Daily  . Chlorhexidine Gluconate Cloth  6 each  Topical Q0600  . furosemide  40 mg Oral Daily  . [START ON 02/15/2014] isosorbide mononitrate  60 mg Oral Daily  . metoprolol tartrate  25 mg Oral BID  .  multivitamin with minerals  1 tablet Oral Daily  . mupirocin ointment  1 application Nasal BID  . potassium chloride SA  20 mEq Oral Daily  . predniSONE  15 mg Oral Q breakfast  . pregabalin  75 mg Oral Daily  . tamsulosin  0.4 mg Oral QPC supper   Continuous Infusions: . sodium chloride 10 mL/hr at 02/12/14 1032   PRN Meds:.acetaminophen, albuterol, diazepam, ipratropium-albuterol, nitroGLYCERIN   Blood pressure 108/50, pulse 74, temperature 97.5 F (36.4 C), temperature source Oral, resp. rate 22, height 5' 11.5" (1.816 m), weight 81.92 kg (180 lb 9.6 oz), SpO2 98.00%.   Results for orders placed during the hospital encounter of 02/12/14 (from the past 48 hour(s))  MRSA PCR SCREENING     Status: Abnormal   Collection Time    02/12/14 12:41 PM      Result Value Ref Range   MRSA by PCR POSITIVE (*) NEGATIVE   Comment:            The GeneXpert MRSA Assay (FDA     approved for NASAL specimens     only), is one component of a     comprehensive MRSA colonization     surveillance program. It is not     intended to diagnose MRSA     infection nor to guide or     monitor treatment for     MRSA infections.     RESULT CALLED TO, READ BACK BY AND VERIFIED WITH:     RODGERS S AT 1956 ON [id number] BY FORSYTH K  CBC     Status: Abnormal   Collection Time    02/13/14  6:01 AM      Result Value Ref Range   WBC 3.5 (*) 4.0 - 10.5 K/uL   RBC 2.82 (*) 4.22 - 5.81 MIL/uL   Hemoglobin 8.8 (*) 13.0 - 17.0 g/dL   HCT 27.4 (*) 39.0 - 52.0 %   MCV 97.2  78.0 - 100.0 fL   MCH 31.2  26.0 - 34.0 pg   MCHC 32.1  30.0 - 36.0 g/dL   RDW 23.5 (*) 11.5 - 15.5 %   Platelets 36 (*) 150 - 400 K/uL   Comment: SPECIMEN CHECKED FOR CLOTS     PLATELET COUNT CONFIRMED BY SMEAR  COMPREHENSIVE METABOLIC PANEL     Status: Abnormal   Collection Time    02/13/14  6:01 AM      Result Value Ref Range   Sodium 142  137 - 147 mEq/L   Potassium 4.3  3.7 - 5.3 mEq/L   Chloride 101  96 - 112 mEq/L   CO2 24  19 -  32 mEq/L   Glucose, Bld 183 (*) 70 - 99 mg/dL   BUN 34 (*) 6 - 23 mg/dL   Creatinine, Ser 2.37 (*) 0.50 - 1.35 mg/dL   Calcium 8.4  8.4 - 10.5 mg/dL   Total Protein 6.4  6.0 - 8.3 g/dL   Albumin 3.1 (*) 3.5 - 5.2 g/dL   AST 12  0 - 37 U/L   ALT 12  0 - 53 U/L   Alkaline Phosphatase 73  39 - 117 U/L   Total Bilirubin 1.3 (*) 0.3 - 1.2 mg/dL   GFR calc non Af Amer 24 (*) >90 mL/min  GFR calc Af Amer 28 (*) >90 mL/min   Comment: (NOTE)     The eGFR has been calculated using the CKD EPI equation.     This calculation has not been validated in all clinical situations.     eGFR's persistently <90 mL/min signify possible Chronic Kidney     Disease.  HEMOGLOBIN AND HEMATOCRIT, BLOOD     Status: Abnormal   Collection Time    02/13/14  5:59 PM      Result Value Ref Range   Hemoglobin 9.6 (*) 13.0 - 17.0 g/dL   HCT 29.1 (*) 39.0 - 52.0 %  CBC WITH DIFFERENTIAL     Status: Abnormal   Collection Time    02/14/14  6:09 AM      Result Value Ref Range   WBC 3.6 (*) 4.0 - 10.5 K/uL   RBC 3.03 (*) 4.22 - 5.81 MIL/uL   Hemoglobin 9.4 (*) 13.0 - 17.0 g/dL   HCT 29.0 (*) 39.0 - 52.0 %   MCV 95.7  78.0 - 100.0 fL   MCH 31.0  26.0 - 34.0 pg   MCHC 32.4  30.0 - 36.0 g/dL   RDW 21.7 (*) 11.5 - 15.5 %   Platelets 35 (*) 150 - 400 K/uL   Comment: SPECIMEN CHECKED FOR CLOTS     CONSISTENT WITH PREVIOUS RESULT   Neutrophils Relative % 48  43 - 77 %   Neutro Abs 1.7  1.7 - 7.7 K/uL   Lymphocytes Relative 41  12 - 46 %   Lymphs Abs 1.5  0.7 - 4.0 K/uL   Monocytes Relative 5  3 - 12 %   Monocytes Absolute 0.2  0.1 - 1.0 K/uL   Eosinophils Relative 4  0 - 5 %   Eosinophils Absolute 0.1  0.0 - 0.7 K/uL   Basophils Relative 2 (*) 0 - 1 %   Basophils Absolute 0.1  0.0 - 0.1 K/uL   Smear Review PLATELETS APPEAR DECREASED    BASIC METABOLIC PANEL     Status: Abnormal   Collection Time    02/14/14  6:09 AM      Result Value Ref Range   Sodium 139  137 - 147 mEq/L   Potassium 3.9  3.7 - 5.3 mEq/L    Chloride 98  96 - 112 mEq/L   CO2 26  19 - 32 mEq/L   Glucose, Bld 176 (*) 70 - 99 mg/dL   BUN 44 (*) 6 - 23 mg/dL   Creatinine, Ser 2.28 (*) 0.50 - 1.35 mg/dL   Calcium 8.7  8.4 - 10.5 mg/dL   GFR calc non Af Amer 25 (*) >90 mL/min   GFR calc Af Amer 29 (*) >90 mL/min   Comment: (NOTE)     The eGFR has been calculated using the CKD EPI equation.     This calculation has not been validated in all clinical situations.     eGFR's persistently <90 mL/min signify possible Chronic Kidney     Disease.  TROPONIN I     Status: None   Collection Time    02/14/14  6:09 AM      Result Value Ref Range   Troponin I <0.30  <0.30 ng/mL   Comment:            Due to the release kinetics of cTnI,     a negative result within the first hours     of the onset of symptoms does not rule out  myocardial infarction with certainty.     If myocardial infarction is still suspected,     repeat the test at appropriate intervals.    No results found.      Alexxis Mackert A. Merlene [name], M.D.  Diplomate, Tax adviser of Psychiatry and Neurology ( Neurology). 02/14/2014, 10:10 AM

## 2014-02-14 NOTE — Progress Notes (Signed)
Patient denies chest pain at this time, vital signs stable.

## 2014-02-15 ENCOUNTER — Other Ambulatory Visit (HOSPITAL_COMMUNITY): Payer: Self-pay | Admitting: Oncology

## 2014-02-15 LAB — CBC WITH DIFFERENTIAL/PLATELET
BASOS ABS: 0.1 10*3/uL (ref 0.0–0.1)
BASOS PCT: 3 % — AB (ref 0–1)
EOS ABS: 0.3 10*3/uL (ref 0.0–0.7)
EOS PCT: 10 % — AB (ref 0–5)
HCT: 26.8 % — ABNORMAL LOW (ref 39.0–52.0)
Hemoglobin: 8.7 g/dL — ABNORMAL LOW (ref 13.0–17.0)
LYMPHS ABS: 0.9 10*3/uL (ref 0.7–4.0)
Lymphocytes Relative: 29 % (ref 12–46)
MCH: 31.1 pg (ref 26.0–34.0)
MCHC: 32.5 g/dL (ref 30.0–36.0)
MCV: 95.7 fL (ref 78.0–100.0)
Monocytes Absolute: 0.2 10*3/uL (ref 0.1–1.0)
Monocytes Relative: 6 % (ref 3–12)
NEUTROS PCT: 51 % (ref 43–77)
Neutro Abs: 1.5 10*3/uL — ABNORMAL LOW (ref 1.7–7.7)
PLATELETS: 35 10*3/uL — AB (ref 150–400)
RBC: 2.8 MIL/uL — ABNORMAL LOW (ref 4.22–5.81)
RDW: 20.4 % — AB (ref 11.5–15.5)
Smear Review: DECREASED
WBC: 3 10*3/uL — ABNORMAL LOW (ref 4.0–10.5)

## 2014-02-15 LAB — VITAMIN B12: VITAMIN B 12: 375 pg/mL (ref 211–911)

## 2014-02-15 LAB — HOMOCYSTEINE: Homocysteine: 12 umol/L (ref 4.0–15.4)

## 2014-02-15 LAB — PREPARE RBC (CROSSMATCH)

## 2014-02-15 MED ORDER — FUROSEMIDE 10 MG/ML IJ SOLN
20.0000 mg | Freq: Once | INTRAMUSCULAR | Status: AC
  Administered 2014-02-15: 20 mg via INTRAVENOUS
  Filled 2014-02-15: qty 2

## 2014-02-15 MED ORDER — HEPARIN SOD (PORK) LOCK FLUSH 100 UNIT/ML IV SOLN: 500.0000 [IU] | Freq: Every day | INTRAVENOUS | Status: DC | PRN

## 2014-02-15 MED ORDER — SODIUM CHLORIDE 0.9 % IJ SOLN: 10.0000 mL | INTRAMUSCULAR | Status: DC | PRN

## 2014-02-15 MED ORDER — DIPHENHYDRAMINE HCL 25 MG PO CAPS
25.0000 mg | ORAL_CAPSULE | Freq: Once | ORAL | Status: AC
  Administered 2014-02-15: 25 mg via ORAL
  Filled 2014-02-15: qty 1

## 2014-02-15 MED ORDER — SODIUM CHLORIDE 0.9 % IJ SOLN: 3.0000 mL | INTRAMUSCULAR | Status: DC | PRN

## 2014-02-15 MED ORDER — SODIUM CHLORIDE 0.9 % IV SOLN: 250.0000 mL | Freq: Once | INTRAVENOUS | Status: DC

## 2014-02-15 MED ORDER — ACETAMINOPHEN 325 MG PO TABS
650.0000 mg | ORAL_TABLET | Freq: Once | ORAL | Status: AC
  Administered 2014-02-15: 650 mg via ORAL
  Filled 2014-02-15: qty 2

## 2014-02-15 MED ORDER — LENALIDOMIDE 5 MG PO CAPS: 5.0000 mg | ORAL_CAPSULE | Freq: Every day | ORAL | Status: DC

## 2014-02-15 NOTE — Progress Notes (Signed)
Subjective: He says he feels much better. He did very well with physical therapy yesterday. He has not had any chest pain. He is much more alert.  Objective: Vital signs in last 24 hours: Temp:  [97.5 F (36.4 C)-97.9 F (36.6 C)] 97.5 F (36.4 C) (05/22 4315) Pulse Rate:  [51-74] 51 (05/22 0608) Resp:  [12-22] 20 (05/22 0608) BP: (102-128)/(44-56) 102/56 mmHg (05/22 0608) SpO2:  [96 %-99 %] 98 % (05/22 0608) Weight change:  Last BM Date: 02/11/14  Intake/Output from previous day: 05/21 0701 - 05/22 0700 In: 350 [P.O.:240; I.V.:110] Out: 1100 [Urine:1100]  PHYSICAL EXAM General appearance: alert, cooperative and no distress Resp: clear to auscultation bilaterally Cardio: regular rate and rhythm, S1, S2 normal, no murmur, click, rub or gallop GI: soft, non-tender; bowel sounds normal; no masses,  no organomegaly Extremities: extremities normal, atraumatic, no cyanosis or edema  Lab Results:    Basic Metabolic Panel:  Recent Labs  02/13/14 0601 02/14/14 0609  NA 142 139  K 4.3 3.9  CL 101 98  CO2 24 26  GLUCOSE 183* 176*  BUN 34* 44*  CREATININE 2.37* 2.28*  CALCIUM 8.4 8.7   Liver Function Tests:  Recent Labs  02/13/14 0601  AST 12  ALT 12  ALKPHOS 73  BILITOT 1.3*  PROT 6.4  ALBUMIN 3.1*   No results found for this basename: LIPASE, AMYLASE,  in the last 72 hours No results found for this basename: AMMONIA,  in the last 72 hours CBC:  Recent Labs  02/14/14 0609 02/15/14 0620  WBC 3.6* 3.0*  NEUTROABS 1.7 1.5*  HGB 9.4* 8.7*  HCT 29.0* 26.8*  MCV 95.7 95.7  PLT 35* 35*   Cardiac Enzymes:  Recent Labs  02/14/14 0609 02/14/14 1358 02/14/14 1946  TROPONINI <0.30 <0.30 <0.30   BNP:  Recent Labs  02/12/14 1006  PROBNP 9708.0*   D-Dimer: No results found for this basename: DDIMER,  in the last 72 hours CBG: No results found for this basename: GLUCAP,  in the last 72 hours Hemoglobin A1C: No results found for this basename:  HGBA1C,  in the last 72 hours Fasting Lipid Panel: No results found for this basename: CHOL, HDL, LDLCALC, TRIG, CHOLHDL, LDLDIRECT,  in the last 72 hours Thyroid Function Tests:  Recent Labs  02/14/14 1358  TSH 1.340   Anemia Panel: No results found for this basename: VITAMINB12, FOLATE, FERRITIN, TIBC, IRON, RETICCTPCT,  in the last 72 hours Coagulation:  Recent Labs  02/12/14 1006  LABPROT 15.0  INR 1.21   Urine Drug Screen: Drugs of Abuse  No results found for this basename: labopia, cocainscrnur, labbenz, amphetmu, thcu, labbarb    Alcohol Level: No results found for this basename: ETH,  in the last 72 hours Urinalysis:  Recent Labs  02/12/14 0945  COLORURINE YELLOW  LABSPEC 1.025  PHURINE 5.5  GLUCOSEU 100*  HGBUR NEGATIVE  BILIRUBINUR NEGATIVE  KETONESUR NEGATIVE  PROTEINUR TRACE*  UROBILINOGEN 0.2  NITRITE NEGATIVE  [place] Labs:  ABGS No results found for this basename: PHART, PCO2, PO2ART, TCO2, HCO3,  in the last 72 hours CULTURES Recent Results (from the past 240 hour(s))  MRSA PCR SCREENING     Status: Abnormal   Collection Time    02/12/14 12:41 PM      Result Value Ref Range Status   MRSA by PCR POSITIVE (*) NEGATIVE Final   Comment:            The GeneXpert  MRSA Assay (FDA     approved for NASAL specimens     only), is one component of a     comprehensive MRSA colonization     surveillance program. It is not     intended to diagnose MRSA     infection nor to guide or     monitor treatment for     MRSA infections.     RESULT CALLED TO, READ BACK BY AND VERIFIED WITH:     RODGERS S AT 1956 ON [id number] BY FORSYTH K   Studies/Results: Dg Chest Port 1 View  02/14/2014   CLINICAL DATA:  Pulmonary edema  EXAM: PORTABLE CHEST - 1 VIEW  COMPARISON:  02/12/2014  FINDINGS: Cardiac enlargement. Fluid overload with vascular congestion and interstitial edema, unchanged. Minimal pleural effusion bilaterally.  IMPRESSION: Mild  fluid overload, unchanged.   Electronically Signed   By: Franchot Gallo M.D.   On: 02/14/2014 12:51    Medications:  Prior to Admission:  Prescriptions prior to admission  Medication Sig Dispense Refill  . acetaminophen (TYLENOL) 500 MG tablet Take 500 mg by mouth every 6 (six) hours as needed for moderate pain.      Marland Kitchen albuterol (PROVENTIL HFA;VENTOLIN HFA) 108 (90 BASE) MCG/ACT inhaler Inhale 2 puffs into the lungs every 6 (six) hours as needed for wheezing.  1 Inhaler  2  . amiodarone (PACERONE) 200 MG tablet Take 0.5 tablets (100 mg total) by mouth daily.  30 tablet  5  . amitriptyline (ELAVIL) 10 MG tablet Take 30 mg by mouth at bedtime.      Marland Kitchen amLODipine (NORVASC) 5 MG tablet TAKE ONE TABLET BY MOUTH EVERY DAY FOR BLOOD PRESSURE  90 tablet  3  . aspirin 325 MG tablet Take 325 mg by mouth daily.      . diazepam (VALIUM) 5 MG tablet Take one tablet by mouth every 6 hours as needed for muscle spasms  120 tablet  5  . furosemide (LASIX) 40 MG tablet Take 1 tablet (40 mg total) by mouth daily. Take one half tablet daily on a routine basis. If your weight goes up by 3 pounds or if you have edema of your legs take one full tablet twice a day for 3 days  30 tablet  11  . HYDROcodone-acetaminophen (NORCO/VICODIN) 5-325 MG per tablet Take 1 by mouth every 6 hours as needed for pain. *MAX APAP=3GM/24 HRS- ALL SOURCES*  120 tablet  0  . insulin aspart (NOVOLOG) 100 UNIT/ML injection Inject 0-15 Units into the skin every 4 (four) hours.  10 mL  11  . ipratropium-albuterol (DUONEB) 0.5-2.5 (3) MG/3ML SOLN Take 3 mLs by nebulization every 6 (six) hours as needed (shortness of breath).  360 mL  5  . isosorbide mononitrate (IMDUR) 30 MG 24 hr tablet Take 1 tablet (30 mg total) by mouth daily.  30 tablet  12  . lenalidomide (REVLIMID) 5 MG capsule Take 1 capsule (5 mg total) by mouth daily.  28 capsule  0  . metFORMIN (GLUCOPHAGE) 500 MG tablet Take 500 mg by mouth 2 (two) times daily with a meal.      .  metoprolol tartrate (LOPRESSOR) 25 MG tablet Take 25 mg by mouth 2 (two) times daily.        . Multiple Vitamin (MULTIVITAMIN) tablet Take 1 tablet by mouth daily.        . nitroGLYCERIN (NITROSTAT) 0.4 MG SL tablet Place 1 tablet (0.4 mg total) under the tongue every 5 (  five) minutes x 3 doses as needed for chest pain (up to 3 doses).  25 tablet  3  . potassium chloride SA (K-DUR,KLOR-CON) 20 MEQ tablet Take 1 tablet (20 mEq total) by mouth daily.  30 tablet  12  . predniSONE (DELTASONE) 20 MG tablet Take 15 mg by mouth daily with breakfast.      . pregabalin (LYRICA) 75 MG capsule Take 1 capsule (75 mg total) by mouth daily.  30 capsule  5  . Red Yeast Rice 600 MG TABS Take 1 tablet by mouth daily.        . silodosin (RAPAFLO) 8 MG CAPS capsule Take 8 mg by mouth daily with breakfast.         Scheduled: . amiodarone  100 mg Oral Daily  . amitriptyline  30 mg Oral QHS  . amLODipine  5 mg Oral Daily  . antiseptic oral rinse  15 mL Mouth Rinse BID  . aspirin  325 mg Oral Daily  . Chlorhexidine Gluconate Cloth  6 each Topical Q0600  . furosemide  40 mg Oral Daily  . isosorbide mononitrate  60 mg Oral Daily  . metoprolol tartrate  25 mg Oral BID  . multivitamin with minerals  1 tablet Oral Daily  . mupirocin ointment  1 application Nasal BID  . potassium chloride SA  20 mEq Oral Daily  . predniSONE  15 mg Oral Q breakfast  . pregabalin  75 mg Oral Daily  . tamsulosin  0.4 mg Oral QPC supper   Continuous: . sodium chloride 10 mL/hr at 02/12/14 1032   HNG:[id number], albuterol, diazepam, ipratropium-albuterol, nitroGLYCERIN  Assesment: He has been admitted with congestive heart failure related to anemia. He is anemic related to his myelodysplastic syndrome 5Q minus. His hemoglobin level has drifted down a bit more. Overall he is much better. He wants to go home. Active Problems:   Anemia   Myelodysplastic syndrome with 5 q minus   CHF (congestive heart failure)   Exertional  dyspnea   Chest pain of uncertain etiology    Plan: He will receive 2 units of packed red blood cells and then be discharged    LOS: 3 days   [name] 02/15/2014, 8:10 AM

## 2014-02-15 NOTE — Progress Notes (Signed)
Subjective: Patient is seen awake in bed.  He reports that he slept well last night.  He denies any complaints.  He feels much improved today.  Clinically, he looks better too.  Objective: Vital signs in last 24 hours: Temp:  [97.5 F (36.4 C)-97.9 F (36.6 C)] 97.5 F (36.4 C) (05/22 4742) Pulse Rate:  [51-74] 51 (05/22 0608) Resp:  [12-22] 20 (05/22 0608) BP: (102-128)/(44-56) 102/56 mmHg (05/22 0608) SpO2:  [96 %-99 %] 98 % (05/22 5956)  Intake/Output from previous day: 05/21 0800 - 05/22 0759 In: 350 [P.O.:240; I.V.:110] Out: 1100 [Urine:1100] Intake/Output this shift:    General appearance: alert, appears older than stated age and no distress  Lab Results:   Recent Labs  02/14/14 0609 02/15/14 0620  WBC 3.6* 3.0*  HGB 9.4* 8.7*  HCT 29.0* 26.8*  PLT 35* 35*   BMET  Recent Labs  02/13/14 0601 02/14/14 0609  NA 142 139  K 4.3 3.9  CL 101 98  CO2 24 26  GLUCOSE 183* 176*  BUN 34* 44*  CREATININE 2.37* 2.28*  CALCIUM 8.4 8.7    Studies/Results: Dg Chest Port 1 View  02/14/2014   CLINICAL DATA:  Pulmonary edema  EXAM: PORTABLE CHEST - 1 VIEW  COMPARISON:  02/12/2014  FINDINGS: Cardiac enlargement. Fluid overload with vascular congestion and interstitial edema, unchanged. Minimal pleural effusion bilaterally.  IMPRESSION: Mild fluid overload, unchanged.   Electronically Signed   By: Franchot Gallo M.D.   On: 02/14/2014 12:51    Medications: I have reviewed the patient's current medications.  Assessment/Plan: 1. Progressive anemia with thrombocytopenia. S/P 3 units of PRBCs on 02/12/2014 and 1 unit of 02/13/2014. We will transfuse him 2 units of PRBCs today prior to discharge. 2. Pancytopenia  3. MDS with 5Q- Syndrome, on Revlimid 5 mg daily. This will be changed to 5 mg daily 21 days on and 7 days off. His 7 day break began yesterday, 02/13/2014.  He will hold Revlimid until he he seen in the clinic 6/3. 4. CHF with pulmonary intersitial edema, per chest  Xray on 5/19.  5. Constipation, will order MOM  6. From a hematology standpoint, he is stable and when his medical issues are stable, he is free for discharge which will be deferred to his attending physician. Following D/C he is to follow-up with the [facility]. He has an appointment on 6/3. He may hold his Revlimid until seen at the [facility].   Patient and plan discussed with Dr. Farrel Gobble and he is in agreement with the aforementioned.     LOS: 3 days    [name] 02/15/2014

## 2014-02-15 NOTE — Progress Notes (Signed)
Patient completed last unit of blood. Has orders to be discharge home. Discharge instructions given, patient verbalized understanding. Prescriptions given. Patient stable. Patient left in private vehicle with family.

## 2014-02-15 NOTE — Progress Notes (Signed)
Patient ID: [name], male   DOB: 1932-07-31, 80 y.o.   MRN: [id number]  [place] A. Merlene Laughter, MD     [email or web address]          [name] is an 80 y.o. male.   Assessment/Plan: 1. Acute encephalopathy most likely due to acute medical illness. This has improved.  2. Gait impairment most likely multifactorial including arthritic changes and likely diabetic neuropathy. Medication and age also plays a role. We have advised patient to be compliant with his assisted devices including a walker and cane. No treatable neurological causes of uncovered.   No a lot better.    GENERAL: Pleasant in no acute distress.  HEENT: Supple. Atraumatic normocephalic.  ABDOMEN: soft  EXTREMITIES: No edema. Several areas of bruising involving the upper extremity status post fall. Also bruising involving the left knee.  BACK: Normal.  SKIN: Normal by inspection.  MENTAL STATUS: Alert and oriented - Person, place year and month. Speech, language and cognition are generally intact. Judgment and insight normal.  MOTOR: Normal tone, bulk and strength- Except left dorsiflexion which is about 4+/5 and seemed to have reduced range of motion; no pronator drift.  COORDINATION: Left finger to nose is normal, right finger to nose is normal, No rest tremor; no intention tremor; Moderate frequency moderate amplitude postural tremor - Left upper exremity; no bradykinesia.        Objective: Vital signs in last 24 hours: Temp:  [97.5 F (36.4 C)-97.9 F (36.6 C)] 97.5 F (36.4 C) (05/22 2841) Pulse Rate:  [51-74] 51 (05/22 0608) Resp:  [12-22] 20 (05/22 0608) BP: (102-128)/(44-56) 102/56 mmHg (05/22 0608) SpO2:  [96 %-99 %] 98 % (05/22 3244)  Intake/Output from previous day: 05/21 0701 - 05/22 0700 In: 350 [P.O.:240; I.V.:110] Out: 1100 [Urine:1100] Intake/Output this shift:   Nutritional status: Cardiac   Lab Results: Results for orders placed during the hospital  encounter of 02/12/14 (from the past 48 hour(s))  HEMOGLOBIN AND HEMATOCRIT, BLOOD     Status: Abnormal   Collection Time    02/13/14  5:59 PM      Result Value Ref Range   Hemoglobin 9.6 (*) 13.0 - 17.0 g/dL   HCT 29.1 (*) 39.0 - 52.0 %  CBC WITH DIFFERENTIAL     Status: Abnormal   Collection Time    02/14/14  6:09 AM      Result Value Ref Range   WBC 3.6 (*) 4.0 - 10.5 K/uL   RBC 3.03 (*) 4.22 - 5.81 MIL/uL   Hemoglobin 9.4 (*) 13.0 - 17.0 g/dL   HCT 29.0 (*) 39.0 - 52.0 %   MCV 95.7  78.0 - 100.0 fL   MCH 31.0  26.0 - 34.0 pg   MCHC 32.4  30.0 - 36.0 g/dL   RDW 21.7 (*) 11.5 - 15.5 %   Platelets 35 (*) 150 - 400 K/uL   Comment: SPECIMEN CHECKED FOR CLOTS     CONSISTENT WITH PREVIOUS RESULT   Neutrophils Relative % 48  43 - 77 %   Neutro Abs 1.7  1.7 - 7.7 K/uL   Lymphocytes Relative 41  12 - 46 %   Lymphs Abs 1.5  0.7 - 4.0 K/uL   Monocytes Relative 5  3 - 12 %   Monocytes Absolute 0.2  0.1 - 1.0 K/uL   Eosinophils Relative 4  0 - 5 %   Eosinophils Absolute 0.1  0.0 - 0.7 K/uL   Basophils Relative 2 (*)  0 - 1 %   Basophils Absolute 0.1  0.0 - 0.1 K/uL   Smear Review PLATELETS APPEAR DECREASED    BASIC METABOLIC PANEL     Status: Abnormal   Collection Time    02/14/14  6:09 AM      Result Value Ref Range   Sodium 139  137 - 147 mEq/L   Potassium 3.9  3.7 - 5.3 mEq/L   Chloride 98  96 - 112 mEq/L   CO2 26  19 - 32 mEq/L   Glucose, Bld 176 (*) 70 - 99 mg/dL   BUN 44 (*) 6 - 23 mg/dL   Creatinine, Ser 2.28 (*) 0.50 - 1.35 mg/dL   Calcium 8.7  8.4 - 10.5 mg/dL   GFR calc non Af Amer 25 (*) >90 mL/min   GFR calc Af Amer 29 (*) >90 mL/min   Comment: (NOTE)     The eGFR has been calculated using the CKD EPI equation.     This calculation has not been validated in all clinical situations.     eGFR's persistently <90 mL/min signify possible Chronic Kidney     Disease.  TROPONIN I     Status: None   Collection Time    02/14/14  6:09 AM      Result Value Ref Range    Troponin I <0.30  <0.30 ng/mL   Comment:            Due to the release kinetics of cTnI,     a negative result within the first hours     of the onset of symptoms does not rule out     myocardial infarction with certainty.     If myocardial infarction is still suspected,     repeat the test at appropriate intervals.  TROPONIN I     Status: None   Collection Time    02/14/14  1:58 PM      Result Value Ref Range   Troponin I <0.30  <0.30 ng/mL   Comment:            Due to the release kinetics of cTnI,     a negative result within the first hours     of the onset of symptoms does not rule out     myocardial infarction with certainty.     If myocardial infarction is still suspected,     repeat the test at appropriate intervals.  TSH     Status: None   Collection Time    02/14/14  1:58 PM      Result Value Ref Range   TSH 1.340  0.350 - 4.500 uIU/mL   Comment: Please note change in reference range.     Performed at [facility]  TROPONIN I     Status: None   Collection Time    02/14/14  7:46 PM      Result Value Ref Range   Troponin I <0.30  <0.30 ng/mL   Comment:            Due to the release kinetics of cTnI,     a negative result within the first hours     of the onset of symptoms does not rule out     myocardial infarction with certainty.     If myocardial infarction is still suspected,     repeat the test at appropriate intervals.  CBC WITH DIFFERENTIAL     Status: Abnormal   Collection Time  02/15/14  6:20 AM      Result Value Ref Range   WBC 3.0 (*) 4.0 - 10.5 K/uL   RBC 2.80 (*) 4.22 - 5.81 MIL/uL   Hemoglobin 8.7 (*) 13.0 - 17.0 g/dL   HCT 26.8 (*) 39.0 - 52.0 %   MCV 95.7  78.0 - 100.0 fL   MCH 31.1  26.0 - 34.0 pg   MCHC 32.5  30.0 - 36.0 g/dL   RDW 20.4 (*) 11.5 - 15.5 %   Platelets 35 (*) 150 - 400 K/uL   Comment: CONSISTENT WITH PREVIOUS RESULT   Neutrophils Relative % 51  43 - 77 %   Neutro Abs 1.5 (*) 1.7 - 7.7 K/uL   Lymphocytes Relative 29   12 - 46 %   Lymphs Abs 0.9  0.7 - 4.0 K/uL   Monocytes Relative 6  3 - 12 %   Monocytes Absolute 0.2  0.1 - 1.0 K/uL   Eosinophils Relative 10 (*) 0 - 5 %   Eosinophils Absolute 0.3  0.0 - 0.7 K/uL   Basophils Relative 3 (*) 0 - 1 %   Basophils Absolute 0.1  0.0 - 0.1 K/uL   Smear Review PLATELETS APPEAR DECREASED      Lipid Panel No results found for this basename: CHOL, TRIG, HDL, CHOLHDL, VLDL, LDLCALC,  in the last 72 hours  Studies/Results: Dg Chest Port 1 View  02/14/2014   CLINICAL DATA:  Pulmonary edema  EXAM: PORTABLE CHEST - 1 VIEW  COMPARISON:  02/12/2014  FINDINGS: Cardiac enlargement. Fluid overload with vascular congestion and interstitial edema, unchanged. Minimal pleural effusion bilaterally.  IMPRESSION: Mild fluid overload, unchanged.   Electronically Signed   By: Franchot Gallo M.D.   On: 02/14/2014 12:51    Medications:  Scheduled Meds: . sodium chloride  250 mL Intravenous Once  . acetaminophen  650 mg Oral Once  . amiodarone  100 mg Oral Daily  . amitriptyline  30 mg Oral QHS  . amLODipine  5 mg Oral Daily  . antiseptic oral rinse  15 mL Mouth Rinse BID  . aspirin  325 mg Oral Daily  . Chlorhexidine Gluconate Cloth  6 each Topical Q0600  . diphenhydrAMINE  25 mg Oral Once  . furosemide  40 mg Oral Daily  . isosorbide mononitrate  60 mg Oral Daily  . metoprolol tartrate  25 mg Oral BID  . multivitamin with minerals  1 tablet Oral Daily  . mupirocin ointment  1 application Nasal BID  . potassium chloride SA  20 mEq Oral Daily  . predniSONE  15 mg Oral Q breakfast  . pregabalin  75 mg Oral Daily  . tamsulosin  0.4 mg Oral QPC supper   Continuous Infusions: . sodium chloride 10 mL/hr at 02/12/14 1032   PRN Meds:.acetaminophen, albuterol, diazepam, heparin lock flush, ipratropium-albuterol, nitroGLYCERIN, sodium chloride, sodium chloride     LOS: 3 days   Dalyn Becker A. Merlene Laughter, M.D.  Diplomate, Tax adviser of Psychiatry and Neurology (  Neurology).

## 2014-02-15 NOTE — Clinical Social Work Note (Signed)
Pt did much better with PT yesterday and plans to return home with home health. CSW will sign off but can be reconsulted if needed.  [name], [place]

## 2014-02-15 NOTE — Progress Notes (Signed)
Went to get blood for transfusion. It had not been irradiated yet. At [facility]. Lab tech will call as soon as it is ready.

## 2014-02-15 NOTE — Discharge Summary (Signed)
Physician Discharge Summary  Patient ID: [name] MRN: [id number] DOB/AGE: [birth year] 80 y.o. Primary Care Physician:Boston Cookson L, MD Admit date: 02/12/2014 Discharge date: 02/15/2014    Discharge Diagnoses:   Active Problems:   Anemia   Myelodysplastic syndrome with 5 q minus   CHF (congestive heart failure)   Exertional dyspnea   Chest pain of uncertain etiology  chronic atrial fibrillation Coronary artery occlusive disease Hypertension Metabolic encephalopathy-resolved    Medication List         acetaminophen 500 MG tablet  Commonly known as:  TYLENOL  Take 500 mg by mouth every 6 (six) hours as needed for moderate pain.     albuterol 108 (90 BASE) MCG/ACT inhaler  Commonly known as:  PROVENTIL HFA;VENTOLIN HFA  Inhale 2 puffs into the lungs every 6 (six) hours as needed for wheezing.     amiodarone 200 MG tablet  Commonly known as:  PACERONE  Take 0.5 tablets (100 mg total) by mouth daily.     amitriptyline 10 MG tablet  Commonly known as:  ELAVIL  Take 30 mg by mouth at bedtime.     amLODipine 5 MG tablet  Commonly known as:  NORVASC  TAKE ONE TABLET BY MOUTH EVERY DAY FOR BLOOD PRESSURE     aspirin 325 MG tablet  Take 325 mg by mouth daily.     diazepam 5 MG tablet  Commonly known as:  VALIUM  Take one tablet by mouth every 6 hours as needed for muscle spasms     furosemide 40 MG tablet  Commonly known as:  LASIX  Take 1 tablet (40 mg total) by mouth daily. Take one half tablet daily on a routine basis. If your weight goes up by 3 pounds or if you have edema of your legs take one full tablet twice a day for 3 days     HYDROcodone-acetaminophen 5-325 MG per tablet  Commonly known as:  NORCO/VICODIN  Take 1 by mouth every 6 hours as needed for pain. *MAX APAP=3GM/24 HRS- ALL SOURCES*     insulin aspart 100 UNIT/ML injection  Commonly known as:  novoLOG  Inject 0-15 Units into the skin every 4 (four) hours.     ipratropium-albuterol  0.5-2.5 (3) MG/3ML Soln  Commonly known as:  DUONEB  Take 3 mLs by nebulization every 6 (six) hours as needed (shortness of breath).     isosorbide mononitrate 30 MG 24 hr tablet  Commonly known as:  IMDUR  Take 1 tablet (30 mg total) by mouth daily.     lenalidomide 5 MG capsule  Commonly known as:  REVLIMID  Take 1 capsule (5 mg total) by mouth daily.     metFORMIN 500 MG tablet  Commonly known as:  GLUCOPHAGE  Take 500 mg by mouth 2 (two) times daily with a meal.     metoprolol tartrate 25 MG tablet  Commonly known as:  LOPRESSOR  Take 25 mg by mouth 2 (two) times daily.     multivitamin tablet  Take 1 tablet by mouth daily.     nitroGLYCERIN 0.4 MG SL tablet  Commonly known as:  NITROSTAT  Place 1 tablet (0.4 mg total) under the tongue every 5 (five) minutes x 3 doses as needed for chest pain (up to 3 doses).     potassium chloride SA 20 MEQ tablet  Commonly known as:  K-DUR,KLOR-CON  Take 1 tablet (20 mEq total) by mouth daily.     predniSONE 20 MG tablet  Commonly  known as:  DELTASONE  Take 15 mg by mouth daily with breakfast.     pregabalin 75 MG capsule  Commonly known as:  LYRICA  Take 1 capsule (75 mg total) by mouth daily.     RAPAFLO 8 MG Caps capsule  Generic drug:  silodosin  Take 8 mg by mouth daily with breakfast.     Red Yeast Rice 600 MG Tabs  Take 1 tablet by mouth daily.        Discharged Condition: Improved    Consults: Oncology/cardiology  Significant Diagnostic Studies: Ct Head Wo Contrast  02/07/2014   CLINICAL DATA:  Fall 4 days ago.  EXAM: CT HEAD WITHOUT CONTRAST  TECHNIQUE: Contiguous axial images were obtained from the base of the skull through the vertex without intravenous contrast.  COMPARISON:  07/27/2006  FINDINGS: Ventricles, cisterns and other CSF spaces are within normal. There is tiny old lacunar infarct over the head of the right caudate nucleus. There is no mass, mass effect, shift of midline structures or acute  hemorrhage. There is no evidence of acute infarction. There is minimal opacification over the ethmoid sinus. Remaining bones and soft tissues are within normal.  IMPRESSION: No acute intracranial findings.  Tiny old lacunar infarct over the right caudate nucleus.  Minimal chronic sinus inflammatory disease.   Electronically Signed   By: Marin Olp M.D.   On: 02/07/2014 16:40   Dg Chest Port 1 View  02/14/2014   CLINICAL DATA:  Pulmonary edema  EXAM: PORTABLE CHEST - 1 VIEW  COMPARISON:  02/12/2014  FINDINGS: Cardiac enlargement. Fluid overload with vascular congestion and interstitial edema, unchanged. Minimal pleural effusion bilaterally.  IMPRESSION: Mild fluid overload, unchanged.   Electronically Signed   By: Franchot Gallo M.D.   On: 02/14/2014 12:51   Dg Chest Portable 1 View  02/12/2014   CLINICAL DATA:  Chest pressure and shortness of breath since this morning; history of open heart surgery in 1996  EXAM: PORTABLE CHEST - 1 VIEW  COMPARISON:  Portable chest x-ray of December 07, 2013  FINDINGS: The lungs are well-expanded. The pulmonary interstitial markings remain increased. The pulmonary vascularity remains engorged but is not as prominent as on the earlier study. The cardiopericardial silhouette is top-normal in size. The mediastinum is not abnormally widened. There are post median sternotomy changes present with evidence of previous CABG.  IMPRESSION: Findings are consistent with congestive heart failure with pulmonary interstitial edema. The pulmonary vascular congestion is not quite as severe as on the previous study.   Electronically Signed   By: David  [place]   On: 02/12/2014 09:04    Lab Results: Basic Metabolic Panel:  Recent Labs  02/13/14 0601 02/14/14 0609  NA 142 139  K 4.3 3.9  CL 101 98  CO2 24 26  GLUCOSE 183* 176*  BUN 34* 44*  CREATININE 2.37* 2.28*  CALCIUM 8.4 8.7   Liver Function Tests:  Recent Labs  02/13/14 0601  AST 12  ALT 12  ALKPHOS 73  BILITOT  1.3*  PROT 6.4  ALBUMIN 3.1*     CBC:  Recent Labs  02/14/14 0609 02/15/14 0620  WBC 3.6* 3.0*  NEUTROABS 1.7 1.5*  HGB 9.4* 8.7*  HCT 29.0* 26.8*  MCV 95.7 95.7  PLT 35* 35*    Recent Results (from the past 240 hour(s))  MRSA PCR SCREENING     Status: Abnormal   Collection Time    02/12/14 12:41 PM      Result Value Ref  Range Status   MRSA by PCR POSITIVE (*) NEGATIVE Final   Comment:            The GeneXpert MRSA Assay (FDA     approved for NASAL specimens     only), is one component of a     comprehensive MRSA colonization     surveillance program. It is not     intended to diagnose MRSA     infection nor to guide or     monitor treatment for     MRSA infections.     RESULT CALLED TO, READ BACK BY AND VERIFIED WITH:     RODGERS S AT 1956 ON [id number] BY [facility] Course: He came to the hospital with increasing shortness of breath. He had been seen as an outpatient and found to be anemic and was being set up for an outpatient blood transfusion but before this could be accomplished he developed increasing shortness of breath came to the emergency department was found to be in congestive heart failure. He was given blood and Lasix and improved. He had some chest pain during hospitalization and that resolved. He initially was very weak and it was felt that he might need skilled care facility rehabilitation but he did much better later. He had some encephalopathy which improved.  Discharge Exam: Blood pressure 102/56, pulse 51, temperature 97.5 F (36.4 C), temperature source Oral, resp. rate 20, height 5' 11.5" (1.816 m), weight 81.92 kg (180 lb 9.6 oz), SpO2 98.00%. He is awake and alert. He is in atrial fibrillation. His chest is much clearer. He is thinking much better.  Disposition: Home with home health services after he receives 2 more units of blood today      Discharge Instructions   Discharge patient    Complete by:  As directed   After blood  transfusion     Face-to-face encounter (required for Medicare/Medicaid patients)    Complete by:  As directed   I [name] certify that this patient is under my care and that I, or a nurse practitioner or physician's assistant working with me, had a face-to-face encounter that meets the physician face-to-face encounter requirements with this patient on 02/15/2014. The encounter with the patient was in whole, or in part for the following medical condition(s) which is the primary reason for home health care (List medical condition): CHF/anemia  The encounter with the patient was in whole, or in part, for the following medical condition, which is the primary reason for home health care:  CHF/anemia  I certify that, based on my findings, the following services are medically necessary home health services:   Nursing Physical therapy    My clinical findings support the need for the above services:  Shortness of breath with activity  Further, I certify that my clinical findings support that this patient is homebound due to:  Shortness of Breath with activity  Reason for Medically Necessary Home Health Services:  Skilled Nursing- Change/Decline in Patient Status     Home Health    Complete by:  As directed   To provide the following care/treatments:   PT RN    Please check CBC and basic metabolic profile on 07/13/5101     Type and screen    Complete by:  Feb 15, 2014              Signed: [name]   02/15/2014, 8:34 AM

## 2014-02-16 LAB — TYPE AND SCREEN
ABO/RH(D): A NEG
Antibody Screen: NEGATIVE
Unit division: 0
Unit division: 0
Unit division: 0
Unit division: 0
Unit division: 0

## 2014-02-19 ENCOUNTER — Encounter (HOSPITAL_COMMUNITY): Payer: Medicare Other

## 2014-02-19 DIAGNOSIS — D46C Myelodysplastic syndrome with isolated del(5q) chromosomal abnormality: Secondary | ICD-10-CM

## 2014-02-19 LAB — CBC
HCT: 35.7 % — ABNORMAL LOW (ref 39.0–52.0)
HEMOGLOBIN: 11.4 g/dL — AB (ref 13.0–17.0)
MCH: 30.6 pg (ref 26.0–34.0)
MCHC: 31.9 g/dL (ref 30.0–36.0)
MCV: 95.7 fL (ref 78.0–100.0)
PLATELETS: 86 10*3/uL — AB (ref 150–400)
RBC: 3.73 MIL/uL — ABNORMAL LOW (ref 4.22–5.81)
RDW: 18.5 % — AB (ref 11.5–15.5)
WBC: 4.4 10*3/uL (ref 4.0–10.5)

## 2014-02-27 ENCOUNTER — Ambulatory Visit (HOSPITAL_COMMUNITY): Payer: Medicare Other

## 2014-02-27 ENCOUNTER — Other Ambulatory Visit (HOSPITAL_COMMUNITY): Payer: Medicare Other

## 2014-03-04 ENCOUNTER — Other Ambulatory Visit (HOSPITAL_BASED_OUTPATIENT_CLINIC_OR_DEPARTMENT_OTHER): Payer: Self-pay | Admitting: Internal Medicine

## 2014-03-07 ENCOUNTER — Other Ambulatory Visit (HOSPITAL_COMMUNITY): Payer: Medicare Other

## 2014-03-07 ENCOUNTER — Encounter (HOSPITAL_COMMUNITY): Payer: Self-pay

## 2014-03-07 ENCOUNTER — Encounter (HOSPITAL_COMMUNITY): Payer: Medicare Other | Attending: Oncology

## 2014-03-07 ENCOUNTER — Encounter (HOSPITAL_COMMUNITY): Payer: Medicare Other

## 2014-03-07 VITALS — BP 119/59 | HR 66 | Temp 97.6°F | Resp 18 | Wt 187.5 lb

## 2014-03-07 DIAGNOSIS — N183 Chronic kidney disease, stage 3 unspecified: Secondary | ICD-10-CM

## 2014-03-07 DIAGNOSIS — D46C Myelodysplastic syndrome with isolated del(5q) chromosomal abnormality: Secondary | ICD-10-CM

## 2014-03-07 DIAGNOSIS — I214 Non-ST elevation (NSTEMI) myocardial infarction: Secondary | ICD-10-CM

## 2014-03-07 DIAGNOSIS — E538 Deficiency of other specified B group vitamins: Secondary | ICD-10-CM | POA: Insufficient documentation

## 2014-03-07 DIAGNOSIS — N1832 Chronic kidney disease, stage 3b: Secondary | ICD-10-CM

## 2014-03-07 LAB — CBC WITH DIFFERENTIAL/PLATELET
Basophils Absolute: 0 10*3/uL (ref 0.0–0.1)
Basophils Relative: 1 % (ref 0–1)
Eosinophils Absolute: 0.6 10*3/uL (ref 0.0–0.7)
Eosinophils Relative: 12 % — ABNORMAL HIGH (ref 0–5)
HCT: 30.9 % — ABNORMAL LOW (ref 39.0–52.0)
Hemoglobin: 10.5 g/dL — ABNORMAL LOW (ref 13.0–17.0)
LYMPHS ABS: 2.1 10*3/uL (ref 0.7–4.0)
LYMPHS PCT: 41 % (ref 12–46)
MCH: 31.7 pg (ref 26.0–34.0)
MCHC: 34 g/dL (ref 30.0–36.0)
MCV: 93.4 fL (ref 78.0–100.0)
Monocytes Absolute: 0.1 10*3/uL (ref 0.1–1.0)
Monocytes Relative: 2 % — ABNORMAL LOW (ref 3–12)
NEUTROS PCT: 44 % (ref 43–77)
Neutro Abs: 2.2 10*3/uL (ref 1.7–7.7)
Platelets: 73 10*3/uL — ABNORMAL LOW (ref 150–400)
RBC: 3.31 MIL/uL — AB (ref 4.22–5.81)
RDW: 16.9 % — ABNORMAL HIGH (ref 11.5–15.5)
WBC: 5 10*3/uL (ref 4.0–10.5)

## 2014-03-07 NOTE — Progress Notes (Signed)
[facility]  OFFICE PROGRESS NOTE  [name], MD [address] [place] [address]  DIAGNOSIS: Myelodysplastic syndrome with 5 q minus  Chronic kidney disease (CKD) stage G3b/A2, moderately decreased glomerular filtration rate (GFR) between 30-44 mL/min/1.73 square meter and albuminuria creatinine ratio between 30-299 mg/g  Non-STEMI (non-ST elevated myocardial infarction)  Chief Complaint  Patient presents with  . MDS    CURRENT THERAPY: Currently on no active therapy since developing pancytopenia from Revlimid in the setting of chronic kidney disease..  INTERVAL HISTORY: [name] 80 y.o. male returns for followup while taking Revlimid 5 mg daily for 3 weeks out of every 4 for myelodysplastic syndrome, 5q- Unfortunately his wife has developed herpes zoster of the chest wall and in combination with her macular degeneration has taken its toll on his well-being. He has fallen twice and has the increasing back pain necessitating the use of a cane again. He denies any fever, night sweats, lower 70 swelling or redness, chest pain, PND, orthopnea, palpitations, epistaxis, melena, hematochezia, but does admit to easy bruising.  MEDICAL HISTORY: Past Medical History  Diagnosis Date  . Essential hypertension, benign   . COPD (chronic obstructive pulmonary disease)   . Coronary atherosclerosis of native coronary artery     Multivessel status post CABG 1996  . Arthritis   . Atrial fibrillation     Coumadin stopped 12/2011 due to anemia  . Dysphagia   . Anemia     Requiring blood transfusions  . Carotid stenosis     Dr Scot Dock   . Diabetes mellitus, type 2   . Mixed hyperlipidemia     Statin intolerant  . Hematuria     Felt related to Foley insertion  . Cardiomyopathy     LVEF 45-50%  . Melena     EGD & Colonoscopy 09/2011 revealed gastritis, erosions, scars, diverticula, 8 colon polyps (largest 1.6cm, not  removed 2/2 anticoagulation), small AVM in transverse colon  . History of tobacco abuse     Quit 1992  . Anxiety   . GERD (gastroesophageal reflux disease)   . Myocardial infarction   . Thrombocytopenia   . Myelodysplastic syndrome with 5 q minus 10/17/2012    On Revlimid 5 mg daily 21 days on and 7 days off  . Aortic stenosis     Mild  . CKD (chronic kidney disease) stage 3, GFR 30-59 ml/min   . Pneumonia 11/2013    history of    INTERIM HISTORY: has DYSLIPIDEMIA; CAD; Atrial fibrillation; PVD; OSTEOARTHRITIS; CAROTID ARTERY STENOSIS; Long term current use of anticoagulant; GI bleeding; AVM (arteriovenous malformation) of colon without hemorrhage; Angina effort; Anemia; History of colonic polyps; Myelodysplastic syndrome with 5 q minus; High output heart failure; Non-ST elevation MI (NSTEMI); Acute on chronic systolic heart failure; Other pancytopenia; HCAP (healthcare-associated pneumonia); Community acquired pneumonia; NSTEMI (non-ST elevated myocardial infarction); Chronic kidney disease (CKD) stage G3b/A2, moderately decreased glomerular filtration rate (GFR) between 30-44 mL/min/1.73 square meter and albuminuria creatinine ratio between 30-299 mg/g; Diabetes; CHF (congestive heart failure); Exertional dyspnea; and Chest pain of uncertain etiology on his problem list.    ALLERGIES:  is allergic to statins and tape.  MEDICATIONS: has a current medication list which includes the following prescription(s): acetaminophen, albuterol, amiodarone, amitriptyline, amlodipine, aspirin, diazepam, furosemide, hydrocodone-acetaminophen, insulin aspart, ipratropium-albuterol, isosorbide mononitrate, lenalidomide, metformin, metoprolol tartrate, multivitamin, nitroglycerin, potassium chloride sa, prednisone, pregabalin, red yeast rice, and silodosin.  SURGICAL  HISTORY:  Past Surgical History  Procedure Laterality Date  . Knee surgery  1960    Right knee cartilage  . Rotator cuff repair  1990    right    . Inguinal hernia repair  2000 and 2010    left  . Cholecystectomy  05/2010    Lap chole with biologic mesh repair/reinforcement by  Dr Rise Patience  . Back surgery  02/2006    ruptured disk,  post op diskitis infection requiring prolonged hospital stay and  surgical I & D  . Tonsillectomy    . Esophagogastroduodenoscopy  10/08/2011    Procedure: ESOPHAGOGASTRODUODENOSCOPY (EGD);  Surgeon: [name], MD;  Location: [facility];  Service: Endoscopy;  Laterality: N/A;  . Colonoscopy  10/08/2011    Procedure: COLONOSCOPY;  Surgeon: [name], MD;  Location: [facility];  Service: Endoscopy;  Laterality: N/A;  . Cataract extraction, bilateral    . Peg placement  07/2006    placed due to prolonged infection, poor po intake, malnutrition during several week hospital stay resulting from ruptured disc that became infected following surgery  . Colonoscopy  01/14/2012    Procedure: COLONOSCOPY;  Surgeon: [name], MD;  Location: [facility];  Service: Endoscopy;  Laterality: N/A;  945  . Coronary artery bypass graft  1996    CABG x 4  . Ventral hernia repair    . Colonoscopy  04/20/2012    Procedure: COLONOSCOPY;  Surgeon: [name], MD;  Location: [facility];  Service: Endoscopy;  Laterality: N/A;  1030  . Pr vein bypass graft,aorto-fem-pop  1996  . Spine surgery    . Joint replacement  1960    Right Knee  . Lymph node biopsy  08/15/2012    Procedure: LYMPH NODE BIOPSY;  Surgeon: Scherry Ran, MD;  Location: [facility];  Service: General;  Laterality: Left;  Cervical Lymph Node Bx in Minor Room  . Bone marrow biopsy    . Bone marrow aspiration      FAMILY HISTORY: family history includes Cancer in his father and sister; Coronary artery disease in his mother; Heart disease in his brother and sister; Hyperlipidemia in his sister; Hypertension in his sister. There is no history of Anesthesia problems, Hypotension, Malignant hyperthermia, or Pseudochol  deficiency.  SOCIAL HISTORY:  reports that he quit smoking about 23 years ago. His smoking use included Cigarettes. He has a 80 pack-year smoking history. He has never used smokeless tobacco. He reports that he does not drink alcohol or use illicit drugs.  REVIEW OF SYSTEMS:  Other than that discussed above is noncontributory.  PHYSICAL EXAMINATION: ECOG PERFORMANCE STATUS: 1 - Symptomatic but completely ambulatory  Blood pressure 119/59, pulse 66, temperature 97.6 F (36.4 C), temperature source Oral, resp. rate 18, weight 187 lb 8 oz (85.049 kg), SpO2 100.00%.  GENERAL:alert, no distress and comfortable SKIN: skin color, texture, turgor are normal, no rashes or significant lesions EYES: PERLA; Conjunctiva are pink and non-injected, sclera clear SINUSES: No redness or tenderness over maxillary or ethmoid sinuses OROPHARYNX:no exudate, no erythema on lips, buccal mucosa, or tongue. NECK: supple, thyroid normal size, non-tender, without nodularity. No masses CHEST: Increased AP diameter with no breast masses. LYMPH:  no palpable lymphadenopathy in the cervical, axillary or inguinal LUNGS: clear to auscultation and percussion with normal breathing effort HEART: regular rate & rhythm and no murmurs. No S3. ABDOMEN:abdomen soft, non-tender and normal bowel sounds. Obese and soft with no organomegaly, ascites, or CVA  tenderness. MUSCULOSKELETAL:no cyanosis of digits and no clubbing. Range of motion normal. Bilateral upper extremity ecchymoses. NEURO: alert & oriented x 3 with fluent speech, no focal motor/sensory deficits   LABORATORY DATA: Appointment on 03/07/2014  Component Date Value Ref Range Status  . WBC 03/07/2014 5.0  4.0 - 10.5 K/uL Final  . RBC 03/07/2014 3.31* 4.22 - 5.81 MIL/uL Final  . Hemoglobin 03/07/2014 10.5* 13.0 - 17.0 g/dL Final  . HCT 02/18/9101 30.9* 39.0 - 52.0 % Final  . MCV 03/07/2014 93.4  78.0 - 100.0 fL Final  . MCH 03/07/2014 31.7  26.0 - 34.0 pg Final  .  MCHC 03/07/2014 34.0  30.0 - 36.0 g/dL Final  . RDW 89/10/2838 16.9* 11.5 - 15.5 % Final  . Platelets 03/07/2014 73* 150 - 400 K/uL Final   Comment: SPECIMEN CHECKED FOR CLOTS                          CONSISTENT WITH PREVIOUS RESULT  . Neutrophils Relative % 03/07/2014 44  43 - 77 % Final  . Neutro Abs 03/07/2014 2.2  1.7 - 7.7 K/uL Final  . Lymphocytes Relative 03/07/2014 41  12 - 46 % Final  . Lymphs Abs 03/07/2014 2.1  0.7 - 4.0 K/uL Final  . Monocytes Relative 03/07/2014 2* 3 - 12 % Final  . Monocytes Absolute 03/07/2014 0.1  0.1 - 1.0 K/uL Final  . Eosinophils Relative 03/07/2014 12* 0 - 5 % Final  . Eosinophils Absolute 03/07/2014 0.6  0.0 - 0.7 K/uL Final  . Basophils Relative 03/07/2014 1  0 - 1 % Final  . Basophils Absolute 03/07/2014 0.0  0.0 - 0.1 K/uL Final  Appointment on 02/19/2014  Component Date Value Ref Range Status  . WBC 02/19/2014 4.4  4.0 - 10.5 K/uL Final  . RBC 02/19/2014 3.73* 4.22 - 5.81 MIL/uL Final  . Hemoglobin 02/19/2014 11.4* 13.0 - 17.0 g/dL Final  . HCT 69/86/1483 35.7* 39.0 - 52.0 % Final  . MCV 02/19/2014 95.7  78.0 - 100.0 fL Final  . MCH 02/19/2014 30.6  26.0 - 34.0 pg Final  . MCHC 02/19/2014 31.9  30.0 - 36.0 g/dL Final  . RDW 07/35/4301 18.5* 11.5 - 15.5 % Final  . Platelets 02/19/2014 86* 150 - 400 K/uL Final   Comment: SPECIMEN CHECKED FOR CLOTS                          PLATELET COUNT CONFIRMED BY SMEAR  Admission on 02/12/2014, Discharged on 02/15/2014  Component Date Value Ref Range Status  . WBC 02/12/2014 2.9* 4.0 - 10.5 K/uL Final  . RBC 02/12/2014 2.13* 4.22 - 5.81 MIL/uL Final  . Hemoglobin 02/12/2014 6.8* 13.0 - 17.0 g/dL Final   Comment: EPITHELIAL CASTS                          CRITICAL RESULT CALLED TO, READ BACK BY AND VERIFIED WITH:                          TIFFANY VOGLER RN ON [id number] AT 0930 BY RESSEGGER R  . HCT 02/12/2014 21.8* 39.0 - 52.0 % Final  . MCV 02/12/2014 102.3* 78.0 - 100.0 fL Final  . MCH 02/12/2014 31.9  26.0  - 34.0 pg Final  . MCHC 02/12/2014 31.2  30.0 - 36.0 g/dL Final  . RDW  02/12/2014 22.6* 11.5 - 15.5 % Final  . Platelets 02/12/2014 36* 150 - 400 K/uL Final   Comment: SPECIMEN CHECKED FOR CLOTS                          PLATELET COUNT CONFIRMED BY SMEAR                          CALLED TO TIFFANY VOGLER RN ON [id number] AT 0930 BY RESSGEER R   . Neutrophils Relative % 02/12/2014 43  43 - 77 % Final  . Neutro Abs 02/12/2014 1.2* 1.7 - 7.7 K/uL Final  . Lymphocytes Relative 02/12/2014 38  12 - 46 % Final  . Lymphs Abs 02/12/2014 1.1  0.7 - 4.0 K/uL Final  . Monocytes Relative 02/12/2014 5  3 - 12 % Final  . Monocytes Absolute 02/12/2014 0.1  0.1 - 1.0 K/uL Final  . Eosinophils Relative 02/12/2014 10* 0 - 5 % Final  . Eosinophils Absolute 02/12/2014 0.3  0.0 - 0.7 K/uL Final  . Basophils Relative 02/12/2014 5* 0 - 1 % Final  . Basophils Absolute 02/12/2014 0.1  0.0 - 0.1 K/uL Final  . Smear Review 02/12/2014 PLATELETS APPEAR DECREASED   Final  . Sodium 02/12/2014 143  137 - 147 mEq/L Final  . Potassium 02/12/2014 4.2  3.7 - 5.3 mEq/L Final  . Chloride 02/12/2014 107  96 - 112 mEq/L Final  . CO2 02/12/2014 21  19 - 32 mEq/L Final  . Glucose, Bld 02/12/2014 157* 70 - 99 mg/dL Final  . BUN 02/12/2014 35* 6 - 23 mg/dL Final  . Creatinine, Ser 02/12/2014 2.20* 0.50 - 1.35 mg/dL Final  . Calcium 02/12/2014 8.4  8.4 - 10.5 mg/dL Final  . GFR calc non Af Amer 02/12/2014 27* >90 mL/min Final  . GFR calc Af Amer 02/12/2014 31* >90 mL/min Final   Comment: (NOTE)                          The eGFR has been calculated using the CKD EPI equation.                          This calculation has not been validated in all clinical situations.                          eGFR's persistently <90 mL/min signify possible Chronic Kidney                          Disease.  . Troponin I 02/12/2014 <0.30  <0.30 ng/mL Final   Comment:                                 Due to the release kinetics of cTnI,                           a negative result within the first hours                          of the onset of symptoms does not rule out  myocardial infarction with certainty.                          If myocardial infarction is still suspected,                          repeat the test at appropriate intervals.  . Prothrombin Time 02/12/2014 15.0  11.6 - 15.2 seconds Final  . INR 02/12/2014 1.21  0.00 - 1.49 Final  . Pro B Natriuretic peptide (BNP) 02/12/2014 9708.0* 0 - 450 pg/mL Final  . Color, Urine 02/12/2014 YELLOW  YELLOW Final  . APPearance 02/12/2014 CLEAR  CLEAR Final  . Specific Gravity, Urine 02/12/2014 1.025  1.005 - 1.030 Final  . pH 02/12/2014 5.5  5.0 - 8.0 Final  . Glucose, UA 02/12/2014 100* NEGATIVE mg/dL Final  . Hgb urine dipstick 02/12/2014 NEGATIVE  NEGATIVE Final  . Bilirubin Urine 02/12/2014 NEGATIVE  NEGATIVE Final  . Ketones, ur 02/12/2014 NEGATIVE  NEGATIVE mg/dL Final  . Protein, ur 02/12/2014 TRACE* NEGATIVE mg/dL Final  . Urobilinogen, UA 02/12/2014 0.2  0.0 - 1.0 mg/dL Final  . Nitrite 02/12/2014 NEGATIVE  NEGATIVE Final  . Leukocytes, UA 02/12/2014 NEGATIVE  NEGATIVE Final  . ABO/RH(D) 02/12/2014 A NEG   Final  . Antibody Screen 02/12/2014 NEG   Final  . Sample Expiration 02/12/2014 02/15/2014   Final  . Unit Number 02/12/2014 [id number]   Final  . Blood Component Type 02/12/2014 RBC, LR IRR   Final  . Unit division 02/12/2014 00   Final  . Status of Unit 02/12/2014 ISSUED,FINAL   Final  . Transfusion Status 02/12/2014 OK TO TRANSFUSE   Final  . Crossmatch Result 02/12/2014 Compatible   Final  . Unit Number 02/12/2014 [id number]   Final  . Blood Component Type 02/12/2014 RBC, LR IRR   Final  . Unit division 02/12/2014 00   Final  . Status of Unit 02/12/2014 ISSUED,FINAL   Final  . Transfusion Status 02/12/2014 OK TO TRANSFUSE   Final  . Crossmatch Result 02/12/2014 Compatible   Final  . Unit Number 02/12/2014 [id number]   Final  . Blood  Component Type 02/12/2014 RBC, LR IRR   Final  . Unit division 02/12/2014 00   Final  . Status of Unit 02/12/2014 ISSUED,FINAL   Final  . Transfusion Status 02/12/2014 OK TO TRANSFUSE   Final  . Crossmatch Result 02/12/2014 Compatible   Final  . Unit Number 02/12/2014 [id number]   Final  . Blood Component Type 02/12/2014 RBC, LR IRR   Final  . Unit division 02/12/2014 00   Final  . Status of Unit 02/12/2014 ISSUED,FINAL   Final  . Transfusion Status 02/12/2014 OK TO TRANSFUSE   Final  . Crossmatch Result 02/12/2014 Compatible   Final  . Unit Number 02/12/2014 [id number]   Final  . Blood Component Type 02/12/2014 RBC, LR IRR   Final  . Unit division 02/12/2014 00   Final  . Status of Unit 02/12/2014 ISSUED,FINAL   Final  . Transfusion Status 02/12/2014 OK TO TRANSFUSE   Final  . Crossmatch Result 02/12/2014 Compatible   Final  . Order Confirmation 02/12/2014 ORDER PROCESSED BY BLOOD BANK   Final  . WBC, UA 02/12/2014 0-2  <3 WBC/hpf Final  . MRSA by PCR 02/12/2014 POSITIVE* NEGATIVE Final   Comment:  The GeneXpert MRSA Assay (FDA                          approved for NASAL specimens                          only), is one component of a                          comprehensive MRSA colonization                          surveillance program. It is not                          intended to diagnose MRSA                          infection nor to guide or                          monitor treatment for                          MRSA infections.                          RESULT CALLED TO, READ BACK BY AND VERIFIED WITH:                          RODGERS S AT 1956 ON [id number] BY FORSYTH K  . WBC 02/13/2014 3.5* 4.0 - 10.5 K/uL Final  . RBC 02/13/2014 2.82* 4.22 - 5.81 MIL/uL Final  . Hemoglobin 02/13/2014 8.8* 13.0 - 17.0 g/dL Final  . HCT 02/13/2014 27.4* 39.0 - 52.0 % Final  . MCV 02/13/2014 97.2  78.0 - 100.0 fL Final  . MCH 02/13/2014 31.2  26.0 - 34.0 pg  Final  . MCHC 02/13/2014 32.1  30.0 - 36.0 g/dL Final  . RDW 02/13/2014 23.5* 11.5 - 15.5 % Final  . Platelets 02/13/2014 36* 150 - 400 K/uL Final   Comment: SPECIMEN CHECKED FOR CLOTS                          PLATELET COUNT CONFIRMED BY SMEAR  . Sodium 02/13/2014 142  137 - 147 mEq/L Final  . Potassium 02/13/2014 4.3  3.7 - 5.3 mEq/L Final  . Chloride 02/13/2014 101  96 - 112 mEq/L Final  . CO2 02/13/2014 24  19 - 32 mEq/L Final  . Glucose, Bld 02/13/2014 183* 70 - 99 mg/dL Final  . BUN 02/13/2014 34* 6 - 23 mg/dL Final  . Creatinine, Ser 02/13/2014 2.37* 0.50 - 1.35 mg/dL Final  . Calcium 02/13/2014 8.4  8.4 - 10.5 mg/dL Final  . Total Protein 02/13/2014 6.4  6.0 - 8.3 g/dL Final  . Albumin 02/13/2014 3.1* 3.5 - 5.2 g/dL Final  . AST 02/13/2014 12  0 - 37 U/L Final  . ALT 02/13/2014 12  0 - 53 U/L Final  . Alkaline Phosphatase 02/13/2014 73  39 - 117 U/L Final  . Total Bilirubin 02/13/2014 1.3* 0.3 - 1.2 mg/dL Final  . GFR calc non Af Amer 02/13/2014 24* >90  mL/min Final  . GFR calc Af Amer 02/13/2014 28* >90 mL/min Final   Comment: (NOTE)                          The eGFR has been calculated using the CKD EPI equation.                          This calculation has not been validated in all clinical situations.                          eGFR's persistently <90 mL/min signify possible Chronic Kidney                          Disease.  . Order Confirmation 02/12/2014 ORDER PROCESSED BY BLOOD BANK   Final  . Hemoglobin 02/13/2014 9.6* 13.0 - 17.0 g/dL Final  . HCT 02/13/2014 29.1* 39.0 - 52.0 % Final  . WBC 02/14/2014 3.6* 4.0 - 10.5 K/uL Final  . RBC 02/14/2014 3.03* 4.22 - 5.81 MIL/uL Final  . Hemoglobin 02/14/2014 9.4* 13.0 - 17.0 g/dL Final  . HCT 02/14/2014 29.0* 39.0 - 52.0 % Final  . MCV 02/14/2014 95.7  78.0 - 100.0 fL Final  . MCH 02/14/2014 31.0  26.0 - 34.0 pg Final  . MCHC 02/14/2014 32.4  30.0 - 36.0 g/dL Final  . RDW 02/14/2014 21.7* 11.5 - 15.5 % Final  . Platelets  02/14/2014 35* 150 - 400 K/uL Final   Comment: SPECIMEN CHECKED FOR CLOTS                          CONSISTENT WITH PREVIOUS RESULT  . Neutrophils Relative % 02/14/2014 48  43 - 77 % Final  . Neutro Abs 02/14/2014 1.7  1.7 - 7.7 K/uL Final  . Lymphocytes Relative 02/14/2014 41  12 - 46 % Final  . Lymphs Abs 02/14/2014 1.5  0.7 - 4.0 K/uL Final  . Monocytes Relative 02/14/2014 5  3 - 12 % Final  . Monocytes Absolute 02/14/2014 0.2  0.1 - 1.0 K/uL Final  . Eosinophils Relative 02/14/2014 4  0 - 5 % Final  . Eosinophils Absolute 02/14/2014 0.1  0.0 - 0.7 K/uL Final  . Basophils Relative 02/14/2014 2* 0 - 1 % Final  . Basophils Absolute 02/14/2014 0.1  0.0 - 0.1 K/uL Final  . Smear Review 02/14/2014 PLATELETS APPEAR DECREASED   Final  . Sodium 02/14/2014 139  137 - 147 mEq/L Final  . Potassium 02/14/2014 3.9  3.7 - 5.3 mEq/L Final  . Chloride 02/14/2014 98  96 - 112 mEq/L Final  . CO2 02/14/2014 26  19 - 32 mEq/L Final  . Glucose, Bld 02/14/2014 176* 70 - 99 mg/dL Final  . BUN 02/14/2014 44* 6 - 23 mg/dL Final  . Creatinine, Ser 02/14/2014 2.28* 0.50 - 1.35 mg/dL Final  . Calcium 02/14/2014 8.7  8.4 - 10.5 mg/dL Final  . GFR calc non Af Amer 02/14/2014 25* >90 mL/min Final  . GFR calc Af Amer 02/14/2014 29* >90 mL/min Final   Comment: (NOTE)                          The eGFR has been calculated using the CKD EPI equation.  This calculation has not been validated in all clinical situations.                          eGFR's persistently <90 mL/min signify possible Chronic Kidney                          Disease.  . Troponin I 02/14/2014 <0.30  <0.30 ng/mL Final   Comment:                                 Due to the release kinetics of cTnI,                          a negative result within the first hours                          of the onset of symptoms does not rule out                          myocardial infarction with certainty.                          If  myocardial infarction is still suspected,                          repeat the test at appropriate intervals.  . Troponin I 02/14/2014 <0.30  <0.30 ng/mL Final   Comment:                                 Due to the release kinetics of cTnI,                          a negative result within the first hours                          of the onset of symptoms does not rule out                          myocardial infarction with certainty.                          If myocardial infarction is still suspected,                          repeat the test at appropriate intervals.  . Troponin I 02/14/2014 <0.30  <0.30 ng/mL Final   Comment:                                 Due to the release kinetics of cTnI,                          a negative result within the first hours                          of the onset  of symptoms does not rule out                          myocardial infarction with certainty.                          If myocardial infarction is still suspected,                          repeat the test at appropriate intervals.  Marland Kitchen TSH 02/14/2014 1.340  0.350 - 4.500 uIU/mL Final   Comment: Please note change in reference range.                          Performed at [facility]  . Vitamin B-12 02/14/2014 375  211 - 911 pg/mL Final   Performed at [facility]  . Homocysteine 02/14/2014 12.0  4.0 - 15.4 umol/L Final   Performed at [facility]  . WBC 02/15/2014 3.0* 4.0 - 10.5 K/uL Final  . RBC 02/15/2014 2.80* 4.22 - 5.81 MIL/uL Final  . Hemoglobin 02/15/2014 8.7* 13.0 - 17.0 g/dL Final  . HCT 02/15/2014 26.8* 39.0 - 52.0 % Final  . MCV 02/15/2014 95.7  78.0 - 100.0 fL Final  . MCH 02/15/2014 31.1  26.0 - 34.0 pg Final  . MCHC 02/15/2014 32.5  30.0 - 36.0 g/dL Final  . RDW 02/15/2014 20.4* 11.5 - 15.5 % Final  . Platelets 02/15/2014 35* 150 - 400 K/uL Final   CONSISTENT WITH PREVIOUS RESULT  . Neutrophils Relative % 02/15/2014 51  43 - 77 % Final  . Neutro Abs  02/15/2014 1.5* 1.7 - 7.7 K/uL Final  . Lymphocytes Relative 02/15/2014 29  12 - 46 % Final  . Lymphs Abs 02/15/2014 0.9  0.7 - 4.0 K/uL Final  . Monocytes Relative 02/15/2014 6  3 - 12 % Final  . Monocytes Absolute 02/15/2014 0.2  0.1 - 1.0 K/uL Final  . Eosinophils Relative 02/15/2014 10* 0 - 5 % Final  . Eosinophils Absolute 02/15/2014 0.3  0.0 - 0.7 K/uL Final  . Basophils Relative 02/15/2014 3* 0 - 1 % Final  . Basophils Absolute 02/15/2014 0.1  0.0 - 0.1 K/uL Final  . Smear Review 02/15/2014 PLATELETS APPEAR DECREASED   Final  . Order Confirmation 02/12/2014 [place]   Final  Hospital Outpatient Visit on 02/07/2014  Component Date Value Ref Range Status  . Sodium 02/07/2014 138  137 - 147 mEq/L Final  . Potassium 02/07/2014 4.5  3.7 - 5.3 mEq/L Final  . Chloride 02/07/2014 100  96 - 112 mEq/L Final  . CO2 02/07/2014 25  19 - 32 mEq/L Final  . Glucose, Bld 02/07/2014 125* 70 - 99 mg/dL Final  . BUN 02/07/2014 38* 6 - 23 mg/dL Final  . Creatinine, Ser 02/07/2014 2.22* 0.50 - 1.35 mg/dL Final  . Calcium 02/07/2014 9.1  8.4 - 10.5 mg/dL Final  . GFR calc non Af Amer 02/07/2014 26* >90 mL/min Final  . GFR calc Af Amer 02/07/2014 30* >90 mL/min Final   Comment: (NOTE)                          The eGFR has been calculated using the CKD EPI equation.  This calculation has not been validated in all clinical situations.                          eGFR's persistently <90 mL/min signify possible Chronic Kidney                          Disease.  . WBC 02/07/2014 3.6* 4.0 - 10.5 K/uL Final  . RBC 02/07/2014 2.31* 4.22 - 5.81 MIL/uL Final  . Hemoglobin 02/07/2014 7.5* 13.0 - 17.0 g/dL Final  . HCT 02/07/2014 22.7* 39.0 - 52.0 % Final  . MCV 02/07/2014 98.3  78.0 - 100.0 fL Final  . MCH 02/07/2014 32.5  26.0 - 34.0 pg Final  . MCHC 02/07/2014 33.0  30.0 - 36.0 g/dL Final  . RDW 02/07/2014 21.6* 11.5 - 15.5 % Final  . Platelets 02/07/2014 62* 150 -  400 K/uL Final   Comment: PLATELET COUNT CONFIRMED BY SMEAR                          LARGE PLATELETS PRESENT                          GIANT PLATELETS SEEN    PATHOLOGY:  FINAL for [name], [name] ([id number]) Patient: [name], [name] Collected: 12/06/2013 Client: [facility] Accession: [id number] Received: 12/06/2013 D. Arne Cleveland DOB: December 12, 1931 Age: 7 Gender: M Reported: 12/23/13 [address] Patient Ph: [phone] MRN #: [id number] [place], [place] [address] Visit #: [id number] Chart #: Phone: [phone] Fax: CC: Robynn Pane, PA-C BONE MARROW REPORT FINAL DIAGNOSIS Diagnosis Bone Marrow, Aspirate,Biopsy, and Clot, right iliac - HYPERCELLULAR BONE MARROW FOR AGE WITH TRILINEAGE HEMATOPOIESIS. - SEE COMMENT. PERIPHERAL BLOOD: - PANCYTOPENIA. Diagnosis Note Compared to previous bone marrow biopsy ([id number]), the dyspoietic changes are much less pronounced with only subtle changes and a background of trilineage hematopoiesis with progressive maturation. There is no increase in blastic cells as primarily seen by morphology and hence there is no evidence of acute leukemia. Correlation with cytogenetic studies is recommended. 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 There is mild poikilocytosis with elliptocytes and tear drop cells. There is  mild polychromasia. The white blood cells are decreased in number. Many neutrophils display toxic granulation but with normal lobation for the most part. Significant neutrophilic left shift is not seen on scan. The platelets are decreased in number. BONE MARROW ASPIRATE: Erythroid precursors: Progressive maturation with lack of significant dyspoiesis. Granulocytic precursors: Progression maturation. Maturing cells display toxic granulation. No increase in blastic cells identified. Megakaryocytes: Abundant with predominantly normal morphology although occasional hypolobated forms are present. Lymphocytes/plasma cells: Large aggregates are not present. TOUCH PREPARATIONS: A mixture of cell types present. CLOT and BIOPSY: The sections show variable cellularity ranging from 30 to 70% with a mixture of myeloid cell types. Expansile sheets of blastic cells are not identified. IRON  STAIN: Iron stains are performed on a bone marrow aspirate smear and section of clot. The controls stained appropriately. Storage Iron: Increased Ringed Sideroblasts: A few cells present ADDITIONAL DATA / TESTING: Specimen was sent for cytogenetic analysis and a separate report will follow. (BNS:caf 12/07/13) Specimen Table Bone Marrow count performed on 500 cells shows: Blasts: 0% Myeloid 54% Promyelocyts: 2% Myelocytes: 13% Erythroid 29% Metamyelocyts: 1% Bands: 10% Lymphocytes: 14% Neutrophils: 16% Eosinophils: 12% Plasma Cells: 2% Basophils: 0% Monocytes: 1% M:E ratio: 1.92 Gross Received in [employer] is a 1.5 x 1.4 x 0.1 cm aggregate of dark red soft tissue/material, submitted in block A. Also received in [employer] in a separate container is a 2 x 0.2 cm core of tan-red firm tissue, which is submitted in block B following decalcification. (SSW:ecj 12/06/2013) Stain(s) Used in Diagnosis The following stain(s) were used in diagnosing the case: Iron Stain. The control(s) stained appropriately. 2 of 3 FINAL for  [name], [name] ([id number]) Report signed out from following location(s) Technical Component and Interpretation performed at [email or web address], [place],    Urinalysis    Component Value Date/Time   COLORURINE YELLOW 02/12/2014 0945   APPEARANCEUR CLEAR 02/12/2014 0945   LABSPEC 1.025 02/12/2014 0945   PHURINE 5.5 02/12/2014 0945   GLUCOSEU 100* 02/12/2014 0945   GLUCOSEU 100 01/27/2012 1255   HGBUR NEGATIVE 02/12/2014 0945   BILIRUBINUR NEGATIVE 02/12/2014 0945   KETONESUR NEGATIVE 02/12/2014 0945   PROTEINUR TRACE* 02/12/2014 0945   UROBILINOGEN 0.2 02/12/2014 0945   NITRITE NEGATIVE 02/12/2014 0945   LEUKOCYTESUR NEGATIVE 02/12/2014 0945    RADIOGRAPHIC STUDIES: Ct Head Wo Contrast  02/07/2014   CLINICAL DATA:  Fall 4 days ago.  EXAM: CT HEAD WITHOUT CONTRAST  TECHNIQUE: Contiguous axial images were obtained from the base of the skull through the vertex without intravenous contrast.  COMPARISON:  07/27/2006  FINDINGS: Ventricles, cisterns and other CSF spaces are within normal. There is tiny old lacunar infarct over the head of the right caudate nucleus. There is no mass, mass effect, shift of midline structures or acute hemorrhage. There is no evidence of acute infarction. There is minimal opacification over the ethmoid sinus. Remaining bones and soft tissues are within normal.  IMPRESSION: No acute intracranial findings.  Tiny old lacunar infarct over the right caudate nucleus.  Minimal chronic sinus inflammatory disease.   Electronically Signed   By: Marin Olp M.D.   On: 02/07/2014 16:40   Dg Chest Port 1 View  02/14/2014   CLINICAL DATA:  Pulmonary edema  EXAM: PORTABLE CHEST - 1 VIEW  COMPARISON:  02/12/2014  FINDINGS: Cardiac enlargement. Fluid overload with vascular congestion and interstitial edema, unchanged. Minimal pleural effusion bilaterally.  IMPRESSION: Mild fluid overload, unchanged.   Electronically Signed   By: Franchot Gallo M.D.   On:  02/14/2014 12:51   Dg Chest Portable 1 View  02/12/2014   CLINICAL DATA:  Chest pressure and shortness of breath since this morning; history of open heart surgery in 1996  EXAM: PORTABLE CHEST - 1 VIEW  COMPARISON:  Portable chest x-ray of December 07, 2013  FINDINGS: The lungs are well-expanded. The pulmonary interstitial markings remain increased. The pulmonary vascularity remains engorged but is not as prominent as on the earlier study. The cardiopericardial silhouette is top-normal in size. The mediastinum is not abnormally widened. There are post median sternotomy changes present with evidence of previous CABG.  IMPRESSION: Findings are consistent with congestive heart failure with pulmonary interstitial  edema. The pulmonary vascular congestion is not quite as severe as on the previous study.   Electronically Signed   By: David  Martinique   On: 02/12/2014 09:04    ASSESSMENT:  #1. Myelodysplastic syndrome with 5q- cytogenetics, stable on no treatment at this time.  #2. Recent pancytopenia secondary to Revlimid toxicity, improved.  #3. Decrease in renal function down to 22 milliliters per minute GFR from 55 mL per minute.  #4. Coronary artery disease, status post nonSTEMI     PLAN:  #1. Hold Revlimid for now. #2. Followup in 6 weeks with CBC.   All questions were answered. The patient knows to call the clinic with any problems, questions or concerns. We can certainly see the patient much sooner if necessary.   I spent 25 minutes counseling the patient face to face. The total time spent in the appointment was 30 minutes.    Doroteo Bradford, MD 03/07/2014 4:08 PM  DISCLAIMER:  This note was dictated with voice recognition software.  Similar sounding words can inadvertently be transcribed inaccurately and may not be corrected upon review.

## 2014-03-07 NOTE — Progress Notes (Signed)
[name]'s reason for visit today is for labs as scheduled per MD orders.  Venipuncture performed with a 23 gauge butterfly needle to R Antecubital.  [name] tolerated procedure well and without incident; questions were answered and patient was discharged.

## 2014-03-07 NOTE — Patient Instructions (Signed)
[facility] Discharge Instructions  RECOMMENDATIONS MADE BY THE CONSULTANT AND ANY TEST RESULTS WILL BE SENT TO YOUR REFERRING PHYSICIAN.  EXAM FINDINGS BY THE PHYSICIAN TODAY AND SIGNS OR SYMPTOMS TO REPORT TO CLINIC OR PRIMARY PHYSICIAN: Exam and findings as discussed by Dr. Barnet Glasgow.  We want you to stay off the revlimid for now.  Report increased fatigue or shortness of breath.  MEDICATIONS PRESCRIBED:  Stop the Revlimid  INSTRUCTIONS/FOLLOW-UP: Follow-up in 6 weeks with labs and office visit.  Thank you for choosing [facility] to provide your oncology and hematology care.  To afford each patient quality time with our providers, please arrive at least 15 minutes before your scheduled appointment time.  With your help, our goal is to use those 15 minutes to complete the necessary work-up to ensure our physicians have the information they need to help with your evaluation and healthcare recommendations.    Effective January 1st, 2014, we ask that you re-schedule your appointment with our physicians should you arrive 10 or more minutes late for your appointment.  We strive to give you quality time with our providers, and arriving late affects you and other patients whose appointments are after yours.    Again, thank you for choosing [facility].  Our hope is that these requests will decrease the amount of time that you wait before being seen by our physicians.       _____________________________________________________________  Should you have questions after your visit to [facility], please contact our office at [phone] between the hours of 8:30 a.m. and 5:00 p.m.  Voicemails left after 4:30 p.m. will not be returned until the following business day.  For prescription refill requests, have your pharmacy contact our office with your prescription refill request.

## 2014-03-07 NOTE — Addendum Note (Signed)
Addended by: Mellissa Kohut on: 03/07/2014 04:36 PM   Modules accepted: Orders

## 2014-03-09 ENCOUNTER — Other Ambulatory Visit (HOSPITAL_BASED_OUTPATIENT_CLINIC_OR_DEPARTMENT_OTHER): Payer: Self-pay | Admitting: Internal Medicine

## 2014-03-11 NOTE — Progress Notes (Signed)
Labs drawn

## 2014-03-22 ENCOUNTER — Other Ambulatory Visit (HOSPITAL_COMMUNITY): Payer: Self-pay | Admitting: Oncology

## 2014-03-22 DIAGNOSIS — D46C Myelodysplastic syndrome with isolated del(5q) chromosomal abnormality: Secondary | ICD-10-CM

## 2014-03-23 ENCOUNTER — Emergency Department (HOSPITAL_COMMUNITY): Payer: Medicare Other

## 2014-03-23 ENCOUNTER — Observation Stay (HOSPITAL_COMMUNITY)
Admission: EM | Admit: 2014-03-23 | Discharge: 2014-03-25 | Disposition: A | Discharge status: Home / Self Care | Payer: Medicare Other | Attending: Pulmonary Disease | Admitting: Pulmonary Disease

## 2014-03-23 ENCOUNTER — Encounter (HOSPITAL_COMMUNITY): Payer: Self-pay | Admitting: Emergency Medicine

## 2014-03-23 DIAGNOSIS — Z8701 Personal history of pneumonia (recurrent): Secondary | ICD-10-CM | POA: Insufficient documentation

## 2014-03-23 DIAGNOSIS — Z7982 Long term (current) use of aspirin: Secondary | ICD-10-CM | POA: Diagnosis not present

## 2014-03-23 DIAGNOSIS — D696 Thrombocytopenia, unspecified: Secondary | ICD-10-CM | POA: Diagnosis not present

## 2014-03-23 DIAGNOSIS — R079 Chest pain, unspecified: Principal | ICD-10-CM

## 2014-03-23 DIAGNOSIS — I4891 Unspecified atrial fibrillation: Secondary | ICD-10-CM | POA: Diagnosis not present

## 2014-03-23 DIAGNOSIS — J449 Chronic obstructive pulmonary disease, unspecified: Secondary | ICD-10-CM | POA: Diagnosis not present

## 2014-03-23 DIAGNOSIS — N183 Chronic kidney disease, stage 3 unspecified: Secondary | ICD-10-CM | POA: Diagnosis not present

## 2014-03-23 DIAGNOSIS — D531 Other megaloblastic anemias, not elsewhere classified: Secondary | ICD-10-CM

## 2014-03-23 DIAGNOSIS — Z951 Presence of aortocoronary bypass graft: Secondary | ICD-10-CM | POA: Diagnosis not present

## 2014-03-23 DIAGNOSIS — E785 Hyperlipidemia, unspecified: Secondary | ICD-10-CM | POA: Insufficient documentation

## 2014-03-23 DIAGNOSIS — E119 Type 2 diabetes mellitus without complications: Secondary | ICD-10-CM | POA: Insufficient documentation

## 2014-03-23 DIAGNOSIS — Z79899 Other long term (current) drug therapy: Secondary | ICD-10-CM | POA: Diagnosis not present

## 2014-03-23 DIAGNOSIS — I6529 Occlusion and stenosis of unspecified carotid artery: Secondary | ICD-10-CM | POA: Insufficient documentation

## 2014-03-23 DIAGNOSIS — D538 Other specified nutritional anemias: Secondary | ICD-10-CM

## 2014-03-23 DIAGNOSIS — F411 Generalized anxiety disorder: Secondary | ICD-10-CM | POA: Insufficient documentation

## 2014-03-23 DIAGNOSIS — I428 Other cardiomyopathies: Secondary | ICD-10-CM | POA: Diagnosis not present

## 2014-03-23 DIAGNOSIS — I208 Other forms of angina pectoris: Secondary | ICD-10-CM | POA: Diagnosis present

## 2014-03-23 DIAGNOSIS — I129 Hypertensive chronic kidney disease with stage 1 through stage 4 chronic kidney disease, or unspecified chronic kidney disease: Secondary | ICD-10-CM | POA: Insufficient documentation

## 2014-03-23 DIAGNOSIS — Z87891 Personal history of nicotine dependence: Secondary | ICD-10-CM | POA: Diagnosis not present

## 2014-03-23 DIAGNOSIS — D46C Myelodysplastic syndrome with isolated del(5q) chromosomal abnormality: Secondary | ICD-10-CM | POA: Diagnosis not present

## 2014-03-23 DIAGNOSIS — K219 Gastro-esophageal reflux disease without esophagitis: Secondary | ICD-10-CM | POA: Insufficient documentation

## 2014-03-23 DIAGNOSIS — M129 Arthropathy, unspecified: Secondary | ICD-10-CM | POA: Diagnosis not present

## 2014-03-23 DIAGNOSIS — I482 Chronic atrial fibrillation, unspecified: Secondary | ICD-10-CM

## 2014-03-23 DIAGNOSIS — N1832 Chronic kidney disease, stage 3b: Secondary | ICD-10-CM

## 2014-03-23 DIAGNOSIS — J4489 Other specified chronic obstructive pulmonary disease: Secondary | ICD-10-CM | POA: Insufficient documentation

## 2014-03-23 DIAGNOSIS — I2510 Atherosclerotic heart disease of native coronary artery without angina pectoris: Secondary | ICD-10-CM

## 2014-03-23 DIAGNOSIS — K921 Melena: Secondary | ICD-10-CM | POA: Diagnosis not present

## 2014-03-23 DIAGNOSIS — I359 Nonrheumatic aortic valve disorder, unspecified: Secondary | ICD-10-CM | POA: Diagnosis not present

## 2014-03-23 DIAGNOSIS — I509 Heart failure, unspecified: Secondary | ICD-10-CM

## 2014-03-23 DIAGNOSIS — D649 Anemia, unspecified: Secondary | ICD-10-CM | POA: Diagnosis not present

## 2014-03-23 DIAGNOSIS — I2089 Other forms of angina pectoris: Secondary | ICD-10-CM

## 2014-03-23 DIAGNOSIS — Q2733 Arteriovenous malformation of digestive system vessel: Secondary | ICD-10-CM

## 2014-03-23 DIAGNOSIS — K5520 Angiodysplasia of colon without hemorrhage: Secondary | ICD-10-CM

## 2014-03-23 DIAGNOSIS — I5083 High output heart failure: Secondary | ICD-10-CM

## 2014-03-23 DIAGNOSIS — I252 Old myocardial infarction: Secondary | ICD-10-CM | POA: Insufficient documentation

## 2014-03-23 DIAGNOSIS — I209 Angina pectoris, unspecified: Secondary | ICD-10-CM

## 2014-03-23 LAB — COMPREHENSIVE METABOLIC PANEL
ALT: 5 U/L (ref 0–53)
AST: 9 U/L (ref 0–37)
Albumin: 3.4 g/dL — ABNORMAL LOW (ref 3.5–5.2)
Alkaline Phosphatase: 87 U/L (ref 39–117)
BUN: 33 mg/dL — ABNORMAL HIGH (ref 6–23)
CO2: 25 meq/L (ref 19–32)
Calcium: 8.9 mg/dL (ref 8.4–10.5)
Chloride: 103 mEq/L (ref 96–112)
Creatinine, Ser: 1.68 mg/dL — ABNORMAL HIGH (ref 0.50–1.35)
GFR calc Af Amer: 43 mL/min — ABNORMAL LOW (ref >90)
GFR calc non Af Amer: 37 mL/min — ABNORMAL LOW (ref >90)
GLUCOSE: 164 mg/dL — AB (ref 70–99)
POTASSIUM: 4 meq/L (ref 3.7–5.3)
SODIUM: 141 meq/L (ref 137–147)
TOTAL PROTEIN: 6.7 g/dL (ref 6.0–8.3)
Total Bilirubin: 0.5 mg/dL (ref 0.3–1.2)

## 2014-03-23 LAB — PREPARE RBC (CROSSMATCH)

## 2014-03-23 LAB — CBC
HCT: 21.8 % — ABNORMAL LOW (ref 39.0–52.0)
HEMOGLOBIN: 7.5 g/dL — AB (ref 13.0–17.0)
MCH: 32.1 pg (ref 26.0–34.0)
MCHC: 34.4 g/dL (ref 30.0–36.0)
MCV: 93.2 fL (ref 78.0–100.0)
Platelets: 86 10*3/uL — ABNORMAL LOW (ref 150–400)
RBC: 2.34 MIL/uL — AB (ref 4.22–5.81)
RDW: 16.7 % — ABNORMAL HIGH (ref 11.5–15.5)
WBC: 2.9 10*3/uL — ABNORMAL LOW (ref 4.0–10.5)

## 2014-03-23 LAB — PRO B NATRIURETIC PEPTIDE: Pro B Natriuretic peptide (BNP): 3416 pg/mL — ABNORMAL HIGH (ref 0–450)

## 2014-03-23 LAB — MAGNESIUM: Magnesium: 2.2 mg/dL (ref 1.5–2.5)

## 2014-03-23 LAB — PROTIME-INR
INR: 1.1 (ref 0.00–1.49)
PROTHROMBIN TIME: 14.2 s (ref 11.6–15.2)

## 2014-03-23 LAB — TROPONIN I: Troponin I: <0.3 ng/mL (ref <0.30)

## 2014-03-23 MED ORDER — FUROSEMIDE 10 MG/ML IJ SOLN
40.0000 mg | Freq: Once | INTRAMUSCULAR | Status: AC
  Administered 2014-03-23: 40 mg via INTRAVENOUS
  Filled 2014-03-23: qty 4

## 2014-03-23 MED ORDER — NITROGLYCERIN 0.4 MG SL SUBL
0.4000 mg | SUBLINGUAL_TABLET | SUBLINGUAL | Status: DC | PRN
  Administered 2014-03-23 (×2): 0.4 mg via SUBLINGUAL
  Filled 2014-03-23: qty 1

## 2014-03-23 NOTE — ED Notes (Signed)
Previously admitted for MI last month. Chest pain this morning, called EMS, refused transport, now to hospital for pain in chest and SOB

## 2014-03-23 NOTE — H&P (Signed)
PCP:   HAWKINS,EDWARD Carlean Jews, MD   Chief Complaint:  cp  HPI: 80 yo male h/o MDS, CAD, copd, afib, angina in past associated with worsening anemia had 2 episodes of sscp one at 430am and than again at 42p today.  First one, ems was called who advised pt did not need to come to ED as his pain was relieved with ntg at the time.  When it happened again at 5pm he came to ED.  sscp associated with sob, no cough no fever.  No swelling.  He says he normally gets this chest pain when his "blood counts get too low".  He requires frequent blood transfusions b/c of his MDS.  He had a nstemi a couple of months ago, it was advised for him to get a lexicscan, but this has not been done yet.  Pt says he wont do it anyway, b/c the last time in 2010 he had that done it made him feel like his heart was going to pop out of his chest.  Cp free since given ntg sl in the ED on arrival.  No bleeding issues, no brbpr, no melena.  No n/v.  He does bruise easily.  Review of Systems:  Positive and negative as per HPI otherwise all other systems are negative  Past Medical History: Past Medical History  Diagnosis Date  . Essential hypertension, benign   . COPD (chronic obstructive pulmonary disease)   . Coronary atherosclerosis of native coronary artery     Multivessel status post CABG 1996  . Arthritis   . Atrial fibrillation     Coumadin stopped 12/2011 due to anemia  . Dysphagia   . Anemia     Requiring blood transfusions  . Carotid stenosis     Dr Scot Dock   . Diabetes mellitus, type 2   . Mixed hyperlipidemia     Statin intolerant  . Hematuria     Felt related to Foley insertion  . Cardiomyopathy     LVEF 45-50%  . Melena     EGD & Colonoscopy 09/2011 revealed gastritis, erosions, scars, diverticula, 8 colon polyps (largest 1.6cm, not removed 2/2 anticoagulation), small AVM in transverse colon  . History of tobacco abuse     Quit 1992  . Anxiety   . GERD (gastroesophageal reflux disease)   . Myocardial  infarction   . Thrombocytopenia   . Myelodysplastic syndrome with 5 q minus 10/17/2012    On Revlimid 5 mg daily 21 days on and 7 days off  . Aortic stenosis     Mild  . CKD (chronic kidney disease) stage 3, GFR 30-59 ml/min   . Pneumonia 11/2013    history of   Past Surgical History  Procedure Laterality Date  . Knee surgery  1960    Right knee cartilage  . Rotator cuff repair  1990    right   . Inguinal hernia repair  2000 and 2010    left  . Cholecystectomy  05/2010    Lap chole with biologic mesh repair/reinforcement by  Dr Rise Patience  . Back surgery  02/2006    ruptured disk,  post op diskitis infection requiring prolonged hospital stay and  surgical I & D  . Tonsillectomy    . Esophagogastroduodenoscopy  10/08/2011    Procedure: ESOPHAGOGASTRODUODENOSCOPY (EGD);  Surgeon: [name], MD;  Location: [facility];  Service: Endoscopy;  Laterality: N/A;  . Colonoscopy  10/08/2011    Procedure: COLONOSCOPY;  Surgeon: [name], MD;  Location: [facility];  Service: Endoscopy;  Laterality: N/A;  . Cataract extraction, bilateral    . Peg placement  07/2006    placed due to prolonged infection, poor po intake, malnutrition during several week hospital stay resulting from ruptured disc that became infected following surgery  . Colonoscopy  01/14/2012    Procedure: COLONOSCOPY;  Surgeon: [name], MD;  Location: [facility];  Service: Endoscopy;  Laterality: N/A;  945  . Coronary artery bypass graft  1996    CABG x 4  . Ventral hernia repair    . Colonoscopy  04/20/2012    Procedure: COLONOSCOPY;  Surgeon: [name], MD;  Location: [facility];  Service: Endoscopy;  Laterality: N/A;  1030  . Pr vein bypass graft,aorto-fem-pop  1996  . Spine surgery    . Joint replacement  1960    Right Knee  . Lymph node biopsy  08/15/2012    Procedure: LYMPH NODE BIOPSY;  Surgeon: Scherry Ran, MD;  Location: [facility];  Service: General;  Laterality: Left;  Cervical  Lymph Node Bx in Minor Room  . Bone marrow biopsy    . Bone marrow aspiration      Medications: Prior to Admission medications   Medication Sig Start Date End Date Taking? Authorizing Provider  albuterol (PROVENTIL HFA;VENTOLIN HFA) 108 (90 BASE) MCG/ACT inhaler Inhale 2 puffs into the lungs every 6 (six) hours as needed for wheezing. 01/08/13  Yes [name], MD  amiodarone (PACERONE) 200 MG tablet Take 0.5 tablets (100 mg total) by mouth daily. 10/08/11  Yes [name], MD  amitriptyline (ELAVIL) 10 MG tablet Take 30 mg by mouth at bedtime.   Yes Historical Provider, MD  amLODipine (NORVASC) 5 MG tablet Take 5 mg by mouth daily.   Yes Historical Provider, MD  aspirin EC 325 MG tablet Take 325 mg by mouth daily.   Yes Historical Provider, MD  diazepam (VALIUM) 5 MG tablet Take one tablet by mouth every 6 hours as needed for muscle spasms 12/10/13  Yes Tiffany L Reed, DO  furosemide (LASIX) 40 MG tablet Take 20-40 mg by mouth daily. Take one half tablet daily on a routine basis. If your weight goes up by 3 pounds or if you have edema of your legs take one full tablet twice a day for 3 days 01/08/13  Yes [name], MD  HYDROcodone-acetaminophen (NORCO/VICODIN) 5-325 MG per tablet Take 1 by mouth every 6 hours as needed for pain. *MAX APAP=3GM/24 HRS- ALL SOURCES* 12/20/13  Yes Tiffany L Reed, DO  isosorbide mononitrate (IMDUR) 30 MG 24 hr tablet Take 1 tablet (30 mg total) by mouth daily. 12/08/13  Yes [name], MD  metFORMIN (GLUCOPHAGE) 500 MG tablet Take 500 mg by mouth 2 (two) times daily with a meal.   Yes Historical Provider, MD  metoprolol tartrate (LOPRESSOR) 25 MG tablet Take 25 mg by mouth 2 (two) times daily.     Yes Historical Provider, MD  Multiple Vitamin (MULTIVITAMIN) tablet Take 1 tablet by mouth daily.     Yes Historical Provider, MD  nitroGLYCERIN (NITROSTAT) 0.4 MG SL tablet Place 1 tablet (0.4 mg total) under the tongue every 5 (five) minutes x 3 doses as  needed for chest pain (up to 3 doses). 01/08/13  Yes [name], MD  potassium chloride SA (K-DUR,KLOR-CON) 20 MEQ tablet Take 1 tablet (20 mEq total) by mouth daily. 12/08/13  Yes [name], MD  pregabalin (LYRICA) 75 MG capsule Take 1 capsule (75 mg total) by mouth daily. 12/18/13  Yes Estill Dooms, MD  Red Yeast Rice 600 MG TABS Take 1 tablet by mouth daily.     Yes Historical Provider, MD  silodosin (RAPAFLO) 8 MG CAPS capsule Take 8 mg by mouth daily with breakfast.     Yes Historical Provider, MD  acetaminophen (TYLENOL) 500 MG tablet Take 500 mg by mouth every 6 (six) hours as needed for moderate pain.    Historical Provider, MD  ipratropium-albuterol (DUONEB) 0.5-2.5 (3) MG/3ML SOLN Take 3 mLs by nebulization every 6 (six) hours as needed (shortness of breath). 12/08/13   [name], MD    Allergies:   Allergies  Allergen Reactions  . Statins Other (See Comments)    Causes legs to hurt.   . Tape     Causes skin to peel off.     Social History:  reports that he quit smoking about 23 years ago. His smoking use included Cigarettes. He has a 80 pack-year smoking history. He has never used smokeless tobacco. He reports that he does not drink alcohol or use illicit drugs.  Family History: Family History  Problem Relation Age of Onset  . Heart disease Sister   . Cancer Sister   . Hyperlipidemia Sister   . Hypertension Sister   . Heart disease Brother     Heart Diseast before age 29  . Coronary artery disease Mother   . Cancer Father     Stomach  . Anesthesia problems Neg Hx   . Hypotension Neg Hx   . Malignant hyperthermia Neg Hx   . Pseudochol deficiency Neg Hx     Physical Exam: Filed Vitals:   03/23/14 2145 03/23/14 2149 03/23/14 2200 03/23/14 2215  BP: 112/47 112/48 115/48 122/45  Pulse: 64 65 63 61  Temp:      TempSrc:      Resp: $Remo'17 12 14 17  'RnRlg$ Height:      Weight:      SpO2: 93% 97% 97% 96%   General appearance: alert, cooperative and no  distress Head: Normocephalic, without obvious abnormality, atraumatic Eyes: negative Nose: Nares normal. Septum midline. Mucosa normal. No drainage or sinus tenderness. Neck: no JVD and supple, symmetrical, trachea midline Lungs: clear to auscultation bilaterally Heart: regular rate and rhythm, S1, S2 normal, no murmur, click, rub or gallop Abdomen: soft, non-tender; bowel sounds normal; no masses,  no organomegaly Extremities: extremities normal, atraumatic, no cyanosis or edema Pulses: 2+ and symmetric Skin: Skin color, texture, turgor normal. No rashes or lesions Neurologic: Grossly normal   Labs on Admission:   Recent Labs  03/23/14 2115  NA 141  K 4.0  CL 103  CO2 25  GLUCOSE 164*  BUN 33*  CREATININE 1.68*  CALCIUM 8.9  MG 2.2    Recent Labs  03/23/14 2115  AST 9  ALT 5  ALKPHOS 87  BILITOT 0.5  PROT 6.7  ALBUMIN 3.4*    Recent Labs  03/23/14 2115  WBC 2.9*  HGB 7.5*  HCT 21.8*  MCV 93.2  PLT 86*    Recent Labs  03/23/14 2115  TROPONINI <0.30   Radiological Exams on Admission: Dg Chest Portable 1 View  03/23/2014   CLINICAL DATA:  Shortness of breath  EXAM: PORTABLE CHEST - 1 VIEW  COMPARISON:  Chest radiograph 02/14/2014  FINDINGS: Status post median sternotomy. Stable enlarged cardiac and mediastinal contours. Elevation of the right hemidiaphragm. No consolidative  pulmonary opacities. No pleural effusion or pneumothorax.  IMPRESSION: Cardiomegaly.  No acute cardiopulmonary process.   Electronically Signed   By: Lovey Newcomer M.D.   On: 03/23/2014 22:37    Assessment/Plan  80 yo male with anginal symptoms associated with worsening anemia  Principal Problem:   Angina effort-  Sounds worrisome for underlying ischemia.  ekg no acute changes, trop initial neg.  He is cp free now, but if agreeable he needs some sort of full cardiac evaluation.  Will defer to his pcp. Transfuse 2 units prbc with lasix b/w units tonight.  Serial cardiac enzymes.   Echo  in march done nml lv fxn, with diastolic dysfunctiion.  Will not repeat echo at this time.   Active Problems:  Stable unless o/w noted   CAD   AVM (arteriovenous malformation) of colon without hemorrhage   Myelodysplastic syndrome with 5 q minus   High output heart failure   Chronic kidney disease (CKD) stage G3b/A2, moderately decreased glomerular filtration rate (GFR) between 30-44 mL/min/1.73 square meter and albuminuria creatinine ratio between 30-299 mg/g  obs on tele.  Full code.  DAVID,RACHAL A 03/23/2014, 10:45 PM

## 2014-03-23 NOTE — ED Provider Notes (Signed)
CSN: [id number]     Arrival date & time 03/23/14  2057 History   First MD Initiated Contact with Patient 03/23/14 2103   This chart was scribed for Dot Lanes, MD by Rosary Lively, ED scribe. This patient was seen in room APA02/APA02 and the patient's care was started at 9:07 PM.    Chief Complaint  Patient presents with  . Chest Pain    The history is provided by the patient. No language interpreter was used.   HPI Comments:  [name] is a 80 y.o. male with h/o MI admission last month who presents to the Emergency Department complaining of sharp CP that began at 4:30AM this morning, with associated arm pain. Pt took nitroglycerin and aspirin without relief, EMS was called. EMS administered oxygen and pt reported feeling better. Pt's relative states that EMS stated he did not need to be seen at ED. Pt states his CP returned around 5:00PM today, with associated SOB. Pt attempted to lay down, but this did not bring relief. Pt denies taking more nitroglycerin. He denies diaphoresis, and any other associated symptoms.    Past Medical History  Diagnosis Date  . Essential hypertension, benign   . COPD (chronic obstructive pulmonary disease)   . Coronary atherosclerosis of native coronary artery     Multivessel status post CABG 1996  . Arthritis   . Atrial fibrillation     Coumadin stopped 12/2011 due to anemia  . Dysphagia   . Anemia     Requiring blood transfusions  . Carotid stenosis     Dr Scot Dock   . Diabetes mellitus, type 2   . Mixed hyperlipidemia     Statin intolerant  . Hematuria     Felt related to Foley insertion  . Cardiomyopathy     LVEF 45-50%  . Melena     EGD & Colonoscopy 09/2011 revealed gastritis, erosions, scars, diverticula, 8 colon polyps (largest 1.6cm, not removed 2/2 anticoagulation), small AVM in transverse colon  . History of tobacco abuse     Quit 1992  . Anxiety   . GERD (gastroesophageal reflux disease)   . Myocardial infarction   .  Thrombocytopenia   . Myelodysplastic syndrome with 5 q minus 10/17/2012    On Revlimid 5 mg daily 21 days on and 7 days off  . Aortic stenosis     Mild  . CKD (chronic kidney disease) stage 3, GFR 30-59 ml/min   . Pneumonia 11/2013    history of   Past Surgical History  Procedure Laterality Date  . Knee surgery  1960    Right knee cartilage  . Rotator cuff repair  1990    right   . Inguinal hernia repair  2000 and 2010    left  . Cholecystectomy  05/2010    Lap chole with biologic mesh repair/reinforcement by  Dr Rise Patience  . Back surgery  02/2006    ruptured disk,  post op diskitis infection requiring prolonged hospital stay and  surgical I & D  . Tonsillectomy    . Esophagogastroduodenoscopy  10/08/2011    Procedure: ESOPHAGOGASTRODUODENOSCOPY (EGD);  Surgeon: [name], MD;  Location: [facility];  Service: Endoscopy;  Laterality: N/A;  . Colonoscopy  10/08/2011    Procedure: COLONOSCOPY;  Surgeon: [name], MD;  Location: [facility];  Service: Endoscopy;  Laterality: N/A;  . Cataract extraction, bilateral    . Peg placement  07/2006    placed due to prolonged infection,  poor po intake, malnutrition during several week hospital stay resulting from ruptured disc that became infected following surgery  . Colonoscopy  01/14/2012    Procedure: COLONOSCOPY;  Surgeon: [name], MD;  Location: [facility];  Service: Endoscopy;  Laterality: N/A;  945  . Coronary artery bypass graft  1996    CABG x 4  . Ventral hernia repair    . Colonoscopy  04/20/2012    Procedure: COLONOSCOPY;  Surgeon: [name], MD;  Location: [facility];  Service: Endoscopy;  Laterality: N/A;  1030  . Pr vein bypass graft,aorto-fem-pop  1996  . Spine surgery    . Joint replacement  1960    Right Knee  . Lymph node biopsy  08/15/2012    Procedure: LYMPH NODE BIOPSY;  Surgeon: Scherry Ran, MD;  Location: [facility];  Service: General;  Laterality: Left;  Cervical Lymph Node Bx in  Minor Room  . Bone marrow biopsy    . Bone marrow aspiration     Family History  Problem Relation Age of Onset  . Heart disease Sister   . Cancer Sister   . Hyperlipidemia Sister   . Hypertension Sister   . Heart disease Brother     Heart Diseast before age 9  . Coronary artery disease Mother   . Cancer Father     Stomach  . Anesthesia problems Neg Hx   . Hypotension Neg Hx   . Malignant hyperthermia Neg Hx   . Pseudochol deficiency Neg Hx    History  Substance Use Topics  . Smoking status: Former Smoker -- 2.00 packs/day for 40 years    Types: Cigarettes    Quit date: 09/27/1990  . Smokeless tobacco: Never Used  . Alcohol Use: No    Review of Systems 10 Systems reviewed and are negative for acute change except as noted in the HPI.   Allergies  Statins and Tape  Home Medications   Prior to Admission medications   Medication Sig Start Date End Date Taking? Authorizing Provider  albuterol (PROVENTIL HFA;VENTOLIN HFA) 108 (90 BASE) MCG/ACT inhaler Inhale 2 puffs into the lungs every 6 (six) hours as needed for wheezing. 01/08/13  Yes [name], MD  amiodarone (PACERONE) 200 MG tablet Take 0.5 tablets (100 mg total) by mouth daily. 10/08/11  Yes [name], MD  amitriptyline (ELAVIL) 10 MG tablet Take 30 mg by mouth at bedtime.   Yes Historical Provider, MD  amLODipine (NORVASC) 5 MG tablet Take 5 mg by mouth daily.   Yes Historical Provider, MD  aspirin EC 325 MG tablet Take 325 mg by mouth daily.   Yes Historical Provider, MD  diazepam (VALIUM) 5 MG tablet Take one tablet by mouth every 6 hours as needed for muscle spasms 12/10/13  Yes Tiffany L Reed, DO  furosemide (LASIX) 40 MG tablet Take 20-40 mg by mouth daily. Take one half tablet daily on a routine basis. If your weight goes up by 3 pounds or if you have edema of your legs take one full tablet twice a day for 3 days 01/08/13  Yes [name], MD  HYDROcodone-acetaminophen (NORCO/VICODIN) 5-325 MG per  tablet Take 1 by mouth every 6 hours as needed for pain. *MAX APAP=3GM/24 HRS- ALL SOURCES* 12/20/13  Yes Tiffany L Reed, DO  isosorbide mononitrate (IMDUR) 30 MG 24 hr tablet Take 1 tablet (30 mg total) by mouth daily. 12/08/13  Yes [name], MD  metFORMIN (GLUCOPHAGE) 500  MG tablet Take 500 mg by mouth 2 (two) times daily with a meal.   Yes Historical Provider, MD  metoprolol tartrate (LOPRESSOR) 25 MG tablet Take 25 mg by mouth 2 (two) times daily.     Yes Historical Provider, MD  Multiple Vitamin (MULTIVITAMIN) tablet Take 1 tablet by mouth daily.     Yes Historical Provider, MD  nitroGLYCERIN (NITROSTAT) 0.4 MG SL tablet Place 1 tablet (0.4 mg total) under the tongue every 5 (five) minutes x 3 doses as needed for chest pain (up to 3 doses). 01/08/13  Yes [name], MD  potassium chloride SA (K-DUR,KLOR-CON) 20 MEQ tablet Take 1 tablet (20 mEq total) by mouth daily. 12/08/13  Yes [name], MD  pregabalin (LYRICA) 75 MG capsule Take 1 capsule (75 mg total) by mouth daily. 12/18/13  Yes Estill Dooms, MD  Red Yeast Rice 600 MG TABS Take 1 tablet by mouth daily.     Yes Historical Provider, MD  silodosin (RAPAFLO) 8 MG CAPS capsule Take 8 mg by mouth daily with breakfast.     Yes Historical Provider, MD  acetaminophen (TYLENOL) 500 MG tablet Take 500 mg by mouth every 6 (six) hours as needed for moderate pain.    Historical Provider, MD  ipratropium-albuterol (DUONEB) 0.5-2.5 (3) MG/3ML SOLN Take 3 mLs by nebulization every 6 (six) hours as needed (shortness of breath). 12/08/13   [name], MD   BP 131/46  Pulse 70  Temp(Src) 98.1 F (36.7 C) (Oral)  Resp 14  Ht _0  (1.803 m)  Wt 187 lb (84.823 kg)  BMI 26.09 kg/m2  SpO2 96% Physical Exam  Nursing note and vitals reviewed. Constitutional: He is oriented to person, place, and time. He appears well-developed and well-nourished. No distress.  HENT:  Head: Normocephalic and atraumatic.  Eyes: Pupils are equal,  round, and reactive to light.  Neck: Normal range of motion.  Cardiovascular: Normal rate and intact distal pulses.   Pulmonary/Chest: No respiratory distress.  Abdominal: Normal appearance. He exhibits no distension. There is no tenderness. There is no rebound.  Musculoskeletal: Normal range of motion.  Neurological: He is alert and oriented to person, place, and time. No cranial nerve deficit.  Skin: Skin is warm and dry. No rash noted.  Psychiatric: He has a normal mood and affect. His behavior is normal.     ED Course  Procedures (including critical care time)  Medications  nitroGLYCERIN (NITROSTAT) SL tablet 0.4 mg (0.4 mg Sublingual Given 03/23/14 2149)    DIAGNOSTIC STUDIES: Oxygen Saturation is 100% on RA, normal by my interpretation.  COORDINATION OF CARE: 9:14 PM-Discussed treatment plan with pt at bedside and pt agreed to plan.  Results for orders placed during the hospital encounter of 03/23/14  CBC      Result Value Ref Range   WBC 2.9 (*) 4.0 - 10.5 K/uL   RBC 2.34 (*) 4.22 - 5.81 MIL/uL   Hemoglobin 7.5 (*) 13.0 - 17.0 g/dL   HCT 21.8 (*) 39.0 - 52.0 %   MCV 93.2  78.0 - 100.0 fL   MCH 32.1  26.0 - 34.0 pg   MCHC 34.4  30.0 - 36.0 g/dL   RDW 16.7 (*) 11.5 - 15.5 %   Platelets 86 (*) 150 - 400 K/uL  COMPREHENSIVE METABOLIC PANEL      Result Value Ref Range   Sodium 141  137 - 147 mEq/L   Potassium 4.0  3.7 - 5.3 mEq/L  Chloride 103  96 - 112 mEq/L   CO2 25  19 - 32 mEq/L   Glucose, Bld 164 (*) 70 - 99 mg/dL   BUN 33 (*) 6 - 23 mg/dL   Creatinine, Ser 1.68 (*) 0.50 - 1.35 mg/dL   Calcium 8.9  8.4 - 10.5 mg/dL   Total Protein 6.7  6.0 - 8.3 g/dL   Albumin 3.4 (*) 3.5 - 5.2 g/dL   AST 9  0 - 37 U/L   ALT 5  0 - 53 U/L   Alkaline Phosphatase 87  39 - 117 U/L   Total Bilirubin 0.5  0.3 - 1.2 mg/dL   GFR calc non Af Amer 37 (*) >90 mL/min   GFR calc Af Amer 43 (*) >90 mL/min  PRO B NATRIURETIC PEPTIDE      Result Value Ref Range   Pro B Natriuretic  peptide (BNP) 3416.0 (*) 0 - 450 pg/mL  MAGNESIUM      Result Value Ref Range   Magnesium 2.2  1.5 - 2.5 mg/dL  PROTIME-INR      Result Value Ref Range   Prothrombin Time 14.2  11.6 - 15.2 seconds   INR 1.10  0.00 - 1.49  TROPONIN I      Result Value Ref Range   Troponin I <0.30  <0.30 ng/mL  TYPE AND SCREEN      Result Value Ref Range   ABO/RH(D) A NEG     Antibody Screen PENDING     Sample Expiration 03/26/2014       EKG Interpretation   Date/Time:  Saturday March 23 2014 21:08:35 EDT Ventricular Rate:  70 PR Interval:  197 QRS Duration: 84 QT Interval:  455 QTC Calculation: 491 R Axis:   55 Text Interpretation:  Sinus rhythm Anteroseptal infarct, old Nonspecific  ST depression in lateral leads Confirmed by BEATON  MD, ROBERT ([id number]) on  03/23/2014 9:25:56 PM      MDM   Final diagnoses:  Chest pain, unspecified chest pain type  Anemia, unspecified anemia type     I personally performed the services described in this documentation, which was scribed in my presence. The recorded information has been reviewed and considered.    Dot Lanes, MD 03/23/14 [phone]

## 2014-03-24 DIAGNOSIS — R079 Chest pain, unspecified: Secondary | ICD-10-CM | POA: Diagnosis not present

## 2014-03-24 LAB — CBC WITH DIFFERENTIAL/PLATELET
BASOS ABS: 0 10*3/uL (ref 0.0–0.1)
BASOS PCT: 0 % (ref 0–1)
EOS PCT: 9 % — AB (ref 0–5)
Eosinophils Absolute: 0.2 10*3/uL (ref 0.0–0.7)
HEMATOCRIT: 27.5 % — AB (ref 39.0–52.0)
Hemoglobin: 9.7 g/dL — ABNORMAL LOW (ref 13.0–17.0)
Lymphocytes Relative: 42 % (ref 12–46)
Lymphs Abs: 1.2 10*3/uL (ref 0.7–4.0)
MCH: 31.7 pg (ref 26.0–34.0)
MCHC: 35.3 g/dL (ref 30.0–36.0)
MCV: 89.9 fL (ref 78.0–100.0)
MONO ABS: 0.1 10*3/uL (ref 0.1–1.0)
Monocytes Relative: 3 % (ref 3–12)
Neutro Abs: 1.3 10*3/uL — ABNORMAL LOW (ref 1.7–7.7)
Neutrophils Relative %: 47 % (ref 43–77)
Platelets: 90 10*3/uL — ABNORMAL LOW (ref 150–400)
RBC: 3.06 MIL/uL — ABNORMAL LOW (ref 4.22–5.81)
RDW: 16.5 % — AB (ref 11.5–15.5)
WBC: 2.8 10*3/uL — ABNORMAL LOW (ref 4.0–10.5)

## 2014-03-24 LAB — MRSA PCR SCREENING: MRSA by PCR: NEGATIVE

## 2014-03-24 LAB — BASIC METABOLIC PANEL
BUN: 30 mg/dL — ABNORMAL HIGH (ref 6–23)
CALCIUM: 9 mg/dL (ref 8.4–10.5)
CO2: 28 mEq/L (ref 19–32)
CREATININE: 1.62 mg/dL — AB (ref 0.50–1.35)
Chloride: 101 mEq/L (ref 96–112)
GFR calc non Af Amer: 38 mL/min — ABNORMAL LOW (ref >90)
GFR, EST AFRICAN AMERICAN: 45 mL/min — AB (ref >90)
Glucose, Bld: 127 mg/dL — ABNORMAL HIGH (ref 70–99)
Potassium: 4.4 mEq/L (ref 3.7–5.3)
Sodium: 139 mEq/L (ref 137–147)

## 2014-03-24 LAB — GLUCOSE, CAPILLARY
GLUCOSE-CAPILLARY: 115 mg/dL — AB (ref 70–99)
GLUCOSE-CAPILLARY: 149 mg/dL — AB (ref 70–99)
GLUCOSE-CAPILLARY: 161 mg/dL — AB (ref 70–99)
Glucose-Capillary: 121 mg/dL — ABNORMAL HIGH (ref 70–99)

## 2014-03-24 LAB — TROPONIN I: Troponin I: <0.3 ng/mL (ref <0.30)

## 2014-03-24 MED ORDER — ACETAMINOPHEN 325 MG PO TABS
650.0000 mg | ORAL_TABLET | ORAL | Status: DC | PRN
Start: 2014-03-23 — End: 2014-03-25

## 2014-03-24 MED ORDER — NITROGLYCERIN 0.4 MG SL SUBL: 0.4000 mg | SUBLINGUAL_TABLET | SUBLINGUAL | Status: DC | PRN

## 2014-03-24 MED ORDER — ONDANSETRON HCL 4 MG/2ML IJ SOLN: 4.0000 mg | Freq: Four times a day (QID) | INTRAMUSCULAR | Status: DC | PRN

## 2014-03-24 MED ORDER — ISOSORBIDE MONONITRATE ER 60 MG PO TB24
30.0000 mg | ORAL_TABLET | Freq: Every day | ORAL | Status: DC
Start: 2014-03-24 — End: 2014-03-25
  Administered 2014-03-24 – 2014-03-25 (×2): 30 mg via ORAL
  Filled 2014-03-24 (×2): qty 1

## 2014-03-24 MED ORDER — DIAZEPAM 5 MG PO TABS
5.0000 mg | ORAL_TABLET | Freq: Every evening | ORAL | Status: DC | PRN
  Administered 2014-03-24: 5 mg via ORAL
  Filled 2014-03-24: qty 1

## 2014-03-24 MED ORDER — METOPROLOL TARTRATE 25 MG PO TABS
25.0000 mg | ORAL_TABLET | Freq: Two times a day (BID) | ORAL | Status: DC
  Administered 2014-03-24 – 2014-03-25 (×3): 25 mg via ORAL
  Filled 2014-03-24 (×4): qty 1

## 2014-03-24 MED ORDER — FUROSEMIDE 20 MG PO TABS
20.0000 mg | ORAL_TABLET | Freq: Every day | ORAL | Status: DC
  Administered 2014-03-24 – 2014-03-25 (×2): 20 mg via ORAL
  Filled 2014-03-24 (×2): qty 1

## 2014-03-24 MED ORDER — IPRATROPIUM-ALBUTEROL 0.5-2.5 (3) MG/3ML IN SOLN: 3.0000 mL | Freq: Four times a day (QID) | RESPIRATORY_TRACT | Status: DC | PRN

## 2014-03-24 MED ORDER — POTASSIUM CHLORIDE CRYS ER 20 MEQ PO TBCR
20.0000 meq | EXTENDED_RELEASE_TABLET | Freq: Every day | ORAL | Status: DC
  Administered 2014-03-24 – 2014-03-25 (×2): 20 meq via ORAL
  Filled 2014-03-24 (×2): qty 1

## 2014-03-24 MED ORDER — ASPIRIN EC 325 MG PO TBEC
325.0000 mg | DELAYED_RELEASE_TABLET | Freq: Every day | ORAL | Status: DC
  Administered 2014-03-24 – 2014-03-25 (×2): 325 mg via ORAL
  Filled 2014-03-24 (×2): qty 1

## 2014-03-24 MED ORDER — AMITRIPTYLINE HCL 10 MG PO TABS
30.0000 mg | ORAL_TABLET | Freq: Every day | ORAL | Status: DC
  Administered 2014-03-24: 30 mg via ORAL
  Filled 2014-03-24 (×2): qty 3

## 2014-03-24 MED ORDER — AMLODIPINE BESYLATE 5 MG PO TABS
5.0000 mg | ORAL_TABLET | Freq: Every day | ORAL | Status: DC
  Administered 2014-03-24 – 2014-03-25 (×2): 5 mg via ORAL
  Filled 2014-03-24 (×2): qty 1

## 2014-03-24 MED ORDER — ALBUTEROL SULFATE (2.5 MG/3ML) 0.083% IN NEBU: 3.0000 mL | INHALATION_SOLUTION | Freq: Four times a day (QID) | RESPIRATORY_TRACT | Status: DC | PRN

## 2014-03-24 MED ORDER — ACETAMINOPHEN 500 MG PO TABS: 500.0000 mg | ORAL_TABLET | Freq: Four times a day (QID) | ORAL | Status: DC | PRN

## 2014-03-24 MED ORDER — PREGABALIN 75 MG PO CAPS
75.0000 mg | ORAL_CAPSULE | Freq: Every day | ORAL | Status: DC
  Administered 2014-03-24 – 2014-03-25 (×2): 75 mg via ORAL
  Filled 2014-03-24 (×2): qty 1

## 2014-03-24 MED ORDER — AMIODARONE HCL 200 MG PO TABS
100.0000 mg | ORAL_TABLET | Freq: Every day | ORAL | Status: DC
  Administered 2014-03-24 – 2014-03-25 (×2): 100 mg via ORAL
  Filled 2014-03-24 (×2): qty 1

## 2014-03-24 NOTE — Progress Notes (Signed)
Per ED lasix was given before transport to the floor. Lasix was to be given in between blood transfusions per order. MD aware. Order given to give transfusions without second dose of lasix. Will continue to monitor.

## 2014-03-24 NOTE — Progress Notes (Signed)
Subjective: He was admitted with anemia and angina related to that. This is one of several admissions with similar presentations. He is known to have mild dysplastic disease which is 5 q. minus he says he feels a little better this morning  Objective: Vital signs in last 24 hours: Temp:  [97.3 F (36.3 C)-98.4 F (36.9 C)] 98.4 F (36.9 C) (06/28 0827) Pulse Rate:  [56-70] 60 (06/28 0827) Resp:  [12-20] 20 (06/28 0827) BP: (112-151)/(41-116) 151/116 mmHg (06/28 0827) SpO2:  [93 %-99 %] 94 % (06/28 0827) Weight:  [84.823 kg (187 lb)-84.868 kg (187 lb 1.6 oz)] 84.868 kg (187 lb 1.6 oz) (06/27 2346) Weight change:  Last BM Date: 03/23/14  Intake/Output from previous day: 06/27 0701 - 06/28 0700 In: 120 [P.O.:120] Out: 2325 [Urine:2325]  PHYSICAL EXAM General appearance: alert, cooperative and mild distress Resp: clear to auscultation bilaterally Cardio: regular rate and rhythm, S1, S2 normal, no murmur, click, rub or gallop GI: soft, non-tender; bowel sounds normal; no masses,  no organomegaly Extremities: extremities normal, atraumatic, no cyanosis or edema  Lab Results:  Results for orders placed during the hospital encounter of 03/23/14 (from the past 48 hour(s))  CBC     Status: Abnormal   Collection Time    03/23/14  9:15 PM      Result Value Ref Range   WBC 2.9 (*) 4.0 - 10.5 K/uL   RBC 2.34 (*) 4.22 - 5.81 MIL/uL   Hemoglobin 7.5 (*) 13.0 - 17.0 g/dL   HCT 21.8 (*) 39.0 - 52.0 %   MCV 93.2  78.0 - 100.0 fL   MCH 32.1  26.0 - 34.0 pg   MCHC 34.4  30.0 - 36.0 g/dL   RDW 16.7 (*) 11.5 - 15.5 %   Platelets 86 (*) 150 - 400 K/uL   Comment: SPECIMEN CHECKED FOR CLOTS  COMPREHENSIVE METABOLIC PANEL     Status: Abnormal   Collection Time    03/23/14  9:15 PM      Result Value Ref Range   Sodium 141  137 - 147 mEq/L   Potassium 4.0  3.7 - 5.3 mEq/L   Chloride 103  96 - 112 mEq/L   CO2 25  19 - 32 mEq/L   Glucose, Bld 164 (*) 70 - 99 mg/dL   BUN 33 (*) 6 - 23 mg/dL    Creatinine, Ser 1.68 (*) 0.50 - 1.35 mg/dL   Calcium 8.9  8.4 - 10.5 mg/dL   Total Protein 6.7  6.0 - 8.3 g/dL   Albumin 3.4 (*) 3.5 - 5.2 g/dL   AST 9  0 - 37 U/L   ALT 5  0 - 53 U/L   Alkaline Phosphatase 87  39 - 117 U/L   Total Bilirubin 0.5  0.3 - 1.2 mg/dL   GFR calc non Af Amer 37 (*) >90 mL/min   GFR calc Af Amer 43 (*) >90 mL/min   Comment: (NOTE)     The eGFR has been calculated using the CKD EPI equation.     This calculation has not been validated in all clinical situations.     eGFR's persistently <90 mL/min signify possible Chronic Kidney     Disease.  PRO B NATRIURETIC PEPTIDE     Status: Abnormal   Collection Time    03/23/14  9:15 PM      Result Value Ref Range   Pro B Natriuretic peptide (BNP) 3416.0 (*) 0 - 450 pg/mL  MAGNESIUM  Status: None   Collection Time    03/23/14  9:15 PM      Result Value Ref Range   Magnesium 2.2  1.5 - 2.5 mg/dL  PROTIME-INR     Status: None   Collection Time    03/23/14  9:15 PM      Result Value Ref Range   Prothrombin Time 14.2  11.6 - 15.2 seconds   INR 1.10  0.00 - 1.49  TROPONIN I     Status: None   Collection Time    03/23/14  9:15 PM      Result Value Ref Range   Troponin I <0.30  <0.30 ng/mL   Comment:            Due to the release kinetics of cTnI,     a negative result within the first hours     of the onset of symptoms does not rule out     myocardial infarction with certainty.     If myocardial infarction is still suspected,     repeat the test at appropriate intervals.  TYPE AND SCREEN     Status: None   Collection Time    03/23/14 10:08 PM      Result Value Ref Range   ABO/RH(D) A NEG     Antibody Screen NEG     Sample Expiration 03/26/2014     Unit Number [id number]     Blood Component Type RBC, LR IRR     Unit division 00     Status of Unit ISSUED     Transfusion Status OK TO TRANSFUSE     Crossmatch Result Compatible     Unit Number [id number]     Blood Component Type RBC, LR IRR      Unit division 00     Status of Unit ISSUED     Transfusion Status OK TO TRANSFUSE     Crossmatch Result Compatible    PREPARE RBC (CROSSMATCH)     Status: None   Collection Time    03/23/14 10:42 PM      Result Value Ref Range   Order Confirmation ORDER PROCESSED BY BLOOD BANK    GLUCOSE, CAPILLARY     Status: Abnormal   Collection Time    03/24/14 12:05 AM      Result Value Ref Range   Glucose-Capillary 149 (*) 70 - 99 mg/dL   Comment 1 Notify RN    MRSA PCR SCREENING     Status: None   Collection Time    03/24/14  2:27 AM      Result Value Ref Range   MRSA by PCR NEGATIVE  NEGATIVE   Comment:            The GeneXpert MRSA Assay (FDA     approved for NASAL specimens     only), is one component of a     comprehensive MRSA colonization     surveillance program. It is not     intended to diagnose MRSA     infection nor to guide or     monitor treatment for     MRSA infections.  GLUCOSE, CAPILLARY     Status: Abnormal   Collection Time    03/24/14  7:28 AM      Result Value Ref Range   Glucose-Capillary 115 (*) 70 - 99 mg/dL   Comment 1 Notify RN      ABGS No results found for this basename: PHART, PCO2, PO2ART, TCO2,  HCO3,  in the last 72 hours CULTURES Recent Results (from the past 240 hour(s))  MRSA PCR SCREENING     Status: None   Collection Time    03/24/14  2:27 AM      Result Value Ref Range Status   MRSA by PCR NEGATIVE  NEGATIVE Final   Comment:            The GeneXpert MRSA Assay (FDA     approved for NASAL specimens     only), is one component of a     comprehensive MRSA colonization     surveillance program. It is not     intended to diagnose MRSA     infection nor to guide or     monitor treatment for     MRSA infections.   Studies/Results: Dg Chest Portable 1 View  03/23/2014   CLINICAL DATA:  Shortness of breath  EXAM: PORTABLE CHEST - 1 VIEW  COMPARISON:  Chest radiograph 02/14/2014  FINDINGS: Status post median sternotomy. Stable enlarged  cardiac and mediastinal contours. Elevation of the right hemidiaphragm. No consolidative pulmonary opacities. No pleural effusion or pneumothorax.  IMPRESSION: Cardiomegaly.  No acute cardiopulmonary process.   Electronically Signed   By: Lovey Newcomer M.D.   On: 03/23/2014 22:37    Medications:  Prior to Admission:  Prescriptions prior to admission  Medication Sig Dispense Refill  . albuterol (PROVENTIL HFA;VENTOLIN HFA) 108 (90 BASE) MCG/ACT inhaler Inhale 2 puffs into the lungs every 6 (six) hours as needed for wheezing.  1 Inhaler  2  . amiodarone (PACERONE) 200 MG tablet Take 0.5 tablets (100 mg total) by mouth daily.  30 tablet  5  . amitriptyline (ELAVIL) 10 MG tablet Take 30 mg by mouth at bedtime.      Marland Kitchen amLODipine (NORVASC) 5 MG tablet Take 5 mg by mouth daily.      Marland Kitchen aspirin EC 325 MG tablet Take 325 mg by mouth daily.      . diazepam (VALIUM) 5 MG tablet Take one tablet by mouth every 6 hours as needed for muscle spasms  120 tablet  5  . furosemide (LASIX) 40 MG tablet Take 20-40 mg by mouth daily. Take one half tablet daily on a routine basis. If your weight goes up by 3 pounds or if you have edema of your legs take one full tablet twice a day for 3 days      . HYDROcodone-acetaminophen (NORCO/VICODIN) 5-325 MG per tablet Take 1 by mouth every 6 hours as needed for pain. *MAX APAP=3GM/24 HRS- ALL SOURCES*  120 tablet  0  . isosorbide mononitrate (IMDUR) 30 MG 24 hr tablet Take 1 tablet (30 mg total) by mouth daily.  30 tablet  12  . metFORMIN (GLUCOPHAGE) 500 MG tablet Take 500 mg by mouth 2 (two) times daily with a meal.      . metoprolol tartrate (LOPRESSOR) 25 MG tablet Take 25 mg by mouth 2 (two) times daily.        . Multiple Vitamin (MULTIVITAMIN) tablet Take 1 tablet by mouth daily.        . nitroGLYCERIN (NITROSTAT) 0.4 MG SL tablet Place 1 tablet (0.4 mg total) under the tongue every 5 (five) minutes x 3 doses as needed for chest pain (up to 3 doses).  25 tablet  3  .  potassium chloride SA (K-DUR,KLOR-CON) 20 MEQ tablet Take 1 tablet (20 mEq total) by mouth daily.  30 tablet  12  . pregabalin (  LYRICA) 75 MG capsule Take 1 capsule (75 mg total) by mouth daily.  30 capsule  5  . Red Yeast Rice 600 MG TABS Take 1 tablet by mouth daily.        . silodosin (RAPAFLO) 8 MG CAPS capsule Take 8 mg by mouth daily with breakfast.        . acetaminophen (TYLENOL) 500 MG tablet Take 500 mg by mouth every 6 (six) hours as needed for moderate pain.      Marland Kitchen ipratropium-albuterol (DUONEB) 0.5-2.5 (3) MG/3ML SOLN Take 3 mLs by nebulization every 6 (six) hours as needed (shortness of breath).  360 mL  5   Scheduled: . amiodarone  100 mg Oral Daily  . amitriptyline  30 mg Oral QHS  . amLODipine  5 mg Oral Daily  . aspirin EC  325 mg Oral Daily  . furosemide  20 mg Oral Daily  . isosorbide mononitrate  30 mg Oral Daily  . metoprolol tartrate  25 mg Oral BID  . potassium chloride SA  20 mEq Oral Daily  . pregabalin  75 mg Oral Daily   Continuous:  YWV:XUCJARWPTYYPE, acetaminophen, ipratropium-albuterol, nitroGLYCERIN, ondansetron (ZOFRAN) IV  Assesment: He was admitted with anemia chest pain and heart failure. His anemia is likely related to his myelodysplastic syndrome 5Q minus. He is no longer having chest pain. Thus far his troponin is normal. Principal Problem:   Angina effort Active Problems:   CAD   AVM (arteriovenous malformation) of colon without hemorrhage   Myelodysplastic syndrome with 5 q minus   High output heart failure   Chronic kidney disease (CKD) stage G3b/A2, moderately decreased glomerular filtration rate (GFR) between 30-44 mL/min/1.73 square meter and albuminuria creatinine ratio between 30-299 mg/g   Chest pain    Plan: He will have a goal hemoglobin level of 10 after his transfusion. He will be evaluated by oncology.    LOS: 1 day   Karma Hiney L 03/24/2014, 10:10 AM

## 2014-03-25 ENCOUNTER — Other Ambulatory Visit (HOSPITAL_COMMUNITY): Payer: Medicare Other

## 2014-03-25 ENCOUNTER — Other Ambulatory Visit (HOSPITAL_COMMUNITY): Payer: Self-pay | Admitting: Oncology

## 2014-03-25 DIAGNOSIS — D649 Anemia, unspecified: Secondary | ICD-10-CM

## 2014-03-25 DIAGNOSIS — R079 Chest pain, unspecified: Secondary | ICD-10-CM | POA: Diagnosis not present

## 2014-03-25 LAB — CBC WITH DIFFERENTIAL/PLATELET
BASOS ABS: 0 10*3/uL (ref 0.0–0.1)
Basophils Relative: 1 % (ref 0–1)
Eosinophils Absolute: 0.2 10*3/uL (ref 0.0–0.7)
Eosinophils Relative: 7 % — ABNORMAL HIGH (ref 0–5)
HEMATOCRIT: 27.9 % — AB (ref 39.0–52.0)
HEMOGLOBIN: 9.5 g/dL — AB (ref 13.0–17.0)
LYMPHS PCT: 49 % — AB (ref 12–46)
Lymphs Abs: 1.6 10*3/uL (ref 0.7–4.0)
MCH: 30.6 pg (ref 26.0–34.0)
MCHC: 34.1 g/dL (ref 30.0–36.0)
MCV: 90 fL (ref 78.0–100.0)
MONO ABS: 0.1 10*3/uL (ref 0.1–1.0)
Monocytes Relative: 4 % (ref 3–12)
NEUTROS ABS: 1.2 10*3/uL — AB (ref 1.7–7.7)
Neutrophils Relative %: 39 % — ABNORMAL LOW (ref 43–77)
Platelets: 87 10*3/uL — ABNORMAL LOW (ref 150–400)
RBC: 3.1 MIL/uL — ABNORMAL LOW (ref 4.22–5.81)
RDW: 16.8 % — ABNORMAL HIGH (ref 11.5–15.5)
WBC: 3.2 10*3/uL — ABNORMAL LOW (ref 4.0–10.5)

## 2014-03-25 LAB — BASIC METABOLIC PANEL
BUN: 29 mg/dL — ABNORMAL HIGH (ref 6–23)
CO2: 28 meq/L (ref 19–32)
CREATININE: 1.57 mg/dL — AB (ref 0.50–1.35)
Calcium: 9.2 mg/dL (ref 8.4–10.5)
Chloride: 101 mEq/L (ref 96–112)
GFR calc Af Amer: 46 mL/min — ABNORMAL LOW (ref >90)
GFR calc non Af Amer: 40 mL/min — ABNORMAL LOW (ref >90)
Glucose, Bld: 115 mg/dL — ABNORMAL HIGH (ref 70–99)
POTASSIUM: 4 meq/L (ref 3.7–5.3)
Sodium: 142 mEq/L (ref 137–147)

## 2014-03-25 LAB — TYPE AND SCREEN
ABO/RH(D): A NEG
ANTIBODY SCREEN: NEGATIVE
UNIT DIVISION: 0
UNIT DIVISION: 0

## 2014-03-25 LAB — TROPONIN I: Troponin I: <0.3 ng/mL (ref <0.30)

## 2014-03-25 LAB — RETICULOCYTES
RBC.: 3.2 MIL/uL — ABNORMAL LOW (ref 4.22–5.81)
RETIC COUNT ABSOLUTE: 25.6 10*3/uL (ref 19.0–186.0)
Retic Ct Pct: 0.8 % (ref 0.4–3.1)

## 2014-03-25 LAB — VITAMIN B12: VITAMIN B 12: 163 pg/mL — AB (ref 211–911)

## 2014-03-25 LAB — FERRITIN: Ferritin: 2349 ng/mL — ABNORMAL HIGH (ref 22–322)

## 2014-03-25 LAB — FOLATE: FOLATE: 11.7 ng/mL

## 2014-03-25 MED ORDER — EPOETIN ALFA 20000 UNIT/ML IJ SOLN
40000.0000 [IU] | Freq: Once | INTRAMUSCULAR | Status: AC
  Administered 2014-03-25: 40000 [IU] via SUBCUTANEOUS
  Filled 2014-03-25: qty 2

## 2014-03-25 NOTE — Progress Notes (Signed)
Pt woke up confused. Was easily re-oriented by nurse. Bed alarm has remained on throughout night. Will continue to monitor.

## 2014-03-25 NOTE — Progress Notes (Signed)
Patient discharged with instructions and care notes.  Verbalized understanding via teach back.  IV was removed and the site was WNL. Patient voiced no further complaints or concerns at the time of discharge.  Appointments scheduled per instructions.  Unable to make the appt. For Dr. Luan Pulling due to the office being closed and the patient was ready to leave.  Patient left the floor via w/c with staff and family in stable condition.  Epogen shot was given prior to discharge as scheduled.

## 2014-03-25 NOTE — Consult Note (Signed)
[facility] Consultation Oncology  Name: [name]      MRN: [id number]    Location: A301/A301-01  Date: 03/25/2014 Time:7:55 AM   REFERRING PHYSICIAN:  [name], MD  REASON FOR CONSULT:  5Q- MDS   DIAGNOSIS:  5Q- MDS  HISTORY OF PRESENT ILLNESS:   [name] is an 80 yo white man who is well known to the [facility] where he was diagnosed and has been undergoing treatment for 5Q- MDS.  His Revlimid was placed on hold on 03/07/2014 due to progressive renal function worsening prohibiting the use of Revlimid.    [name] reports that over the weekend he experienced chest pain and pressure on Saturday.  He called EMS and they treated him at home with O2 and nitroglycerin.  The chest pain resolved and [name] refused to go to the ED.  Later on Saturday, he experienced chest pressure again, and this time, he presented to the ED.  I personally reviewed and went over laboratory results with the patient.  The results are noted within this dictation.  In the ED, his Hgb was noted to be 7.5 g/dL.  He was subsequently admitted to the [facility] for further evaluation and management.   I personally reviewed and went over radiographic studies with the patient.  The results are noted within this dictation.    I have discussed the case with Dr. Luan Pulling.  His management from here can be performed on an outpatient basis which is appropriate at this time.  ESA therapy was discussed and I will start that today with Procrit.    Today, [name] denies any complaints and ROS questioning is negative from a hematologic standpoint.  He denies any blood in stool, black tarry stool, hematuria, hemoptysis, gingival bleeding, and spontaneous bleeding.   PAST MEDICAL HISTORY:   Past Medical History  Diagnosis Date  . Essential hypertension, benign   . COPD (chronic obstructive pulmonary disease)   . Coronary atherosclerosis of native coronary artery     Multivessel  status post CABG 1996  . Arthritis   . Atrial fibrillation     Coumadin stopped 12/2011 due to anemia  . Dysphagia   . Anemia     Requiring blood transfusions  . Carotid stenosis     Dr Scot Dock   . Diabetes mellitus, type 2   . Mixed hyperlipidemia     Statin intolerant  . Hematuria     Felt related to Foley insertion  . Cardiomyopathy     LVEF 45-50%  . Melena     EGD & Colonoscopy 09/2011 revealed gastritis, erosions, scars, diverticula, 8 colon polyps (largest 1.6cm, not removed 2/2 anticoagulation), small AVM in transverse colon  . History of tobacco abuse     Quit 1992  . Anxiety   . GERD (gastroesophageal reflux disease)   . Myocardial infarction   . Thrombocytopenia   . Myelodysplastic syndrome with 5 q minus 10/17/2012    On Revlimid 5 mg daily 21 days on and 7 days off  . Aortic stenosis     Mild  . CKD (chronic kidney disease) stage 3, GFR 30-59 ml/min   . Pneumonia 11/2013    history of    ALLERGIES: Allergies  Allergen Reactions  . Statins Other (See Comments)    Causes legs to hurt.   . Tape     Causes skin to peel off.       MEDICATIONS: I have reviewed the patient's current  medications.     PAST SURGICAL HISTORY Past Surgical History  Procedure Laterality Date  . Knee surgery  1960    Right knee cartilage  . Rotator cuff repair  1990    right   . Inguinal hernia repair  2000 and 2010    left  . Cholecystectomy  05/2010    Lap chole with biologic mesh repair/reinforcement by  Dr Rise Patience  . Back surgery  02/2006    ruptured disk,  post op diskitis infection requiring prolonged hospital stay and  surgical I & D  . Tonsillectomy    . Esophagogastroduodenoscopy  10/08/2011    Procedure: ESOPHAGOGASTRODUODENOSCOPY (EGD);  Surgeon: [name], MD;  Location: [facility];  Service: Endoscopy;  Laterality: N/A;  . Colonoscopy  10/08/2011    Procedure: COLONOSCOPY;  Surgeon: [name], MD;  Location: [facility];  Service: Endoscopy;   Laterality: N/A;  . Cataract extraction, bilateral    . Peg placement  07/2006    placed due to prolonged infection, poor po intake, malnutrition during several week hospital stay resulting from ruptured disc that became infected following surgery  . Colonoscopy  01/14/2012    Procedure: COLONOSCOPY;  Surgeon: [name], MD;  Location: [facility];  Service: Endoscopy;  Laterality: N/A;  945  . Coronary artery bypass graft  1996    CABG x 4  . Ventral hernia repair    . Colonoscopy  04/20/2012    Procedure: COLONOSCOPY;  Surgeon: [name], MD;  Location: [facility];  Service: Endoscopy;  Laterality: N/A;  1030  . Pr vein bypass graft,aorto-fem-pop  1996  . Spine surgery    . Joint replacement  1960    Right Knee  . Lymph node biopsy  08/15/2012    Procedure: LYMPH NODE BIOPSY;  Surgeon: Scherry Ran, MD;  Location: [facility] ORS;  Service: General;  Laterality: Left;  Cervical Lymph Node Bx in Minor Room  . Bone marrow biopsy    . Bone marrow aspiration      FAMILY HISTORY: Family History  Problem Relation Age of Onset  . Heart disease Sister   . Cancer Sister   . Hyperlipidemia Sister   . Hypertension Sister   . Heart disease Brother     Heart Diseast before age 53  . Coronary artery disease Mother   . Cancer Father     Stomach  . Anesthesia problems Neg Hx   . Hypotension Neg Hx   . Malignant hyperthermia Neg Hx   . Pseudochol deficiency Neg Hx     SOCIAL HISTORY:  reports that he quit smoking about 23 years ago. His smoking use included Cigarettes. He has a 80 pack-year smoking history. He has never used smokeless tobacco. He reports that he does not drink alcohol or use illicit drugs.  PERFORMANCE STATUS: The patient's performance status is 1 - Symptomatic but completely ambulatory  PHYSICAL EXAM: Most Recent Vital Signs: Blood pressure 121/40, pulse 53, temperature 97.1 F (36.2 C), temperature source Oral, resp. rate 20, height _0  (1.803 m),  weight 187 lb 1.6 oz (84.868 kg), SpO2 94.00%. General appearance: alert, cooperative, appears older than stated age and no distress Head: Normocephalic, without obvious abnormality, atraumatic Eyes: negative findings: lids and lashes normal, conjunctivae and sclerae normal and corneas clear Throat: normal findings: lips normal without lesions and buccal mucosa normal Lungs: clear to auscultation bilaterally Heart: regular rate and rhythm Extremities: extremities normal, atraumatic, no cyanosis or  edema Skin: Skin color, texture, turgor normal. No rashes or lesions Neurologic: Grossly normal  LABORATORY DATA:  Results for orders placed during the hospital encounter of 03/23/14 (from the past 48 hour(s))  CBC     Status: Abnormal   Collection Time    03/23/14  9:15 PM      Result Value Ref Range   WBC 2.9 (*) 4.0 - 10.5 K/uL   RBC 2.34 (*) 4.22 - 5.81 MIL/uL   Hemoglobin 7.5 (*) 13.0 - 17.0 g/dL   HCT 21.8 (*) 39.0 - 52.0 %   MCV 93.2  78.0 - 100.0 fL   MCH 32.1  26.0 - 34.0 pg   MCHC 34.4  30.0 - 36.0 g/dL   RDW 16.7 (*) 11.5 - 15.5 %   Platelets 86 (*) 150 - 400 K/uL   Comment: SPECIMEN CHECKED FOR CLOTS  COMPREHENSIVE METABOLIC PANEL     Status: Abnormal   Collection Time    03/23/14  9:15 PM      Result Value Ref Range   Sodium 141  137 - 147 mEq/L   Potassium 4.0  3.7 - 5.3 mEq/L   Chloride 103  96 - 112 mEq/L   CO2 25  19 - 32 mEq/L   Glucose, Bld 164 (*) 70 - 99 mg/dL   BUN 33 (*) 6 - 23 mg/dL   Creatinine, Ser 1.68 (*) 0.50 - 1.35 mg/dL   Calcium 8.9  8.4 - 10.5 mg/dL   Total Protein 6.7  6.0 - 8.3 g/dL   Albumin 3.4 (*) 3.5 - 5.2 g/dL   AST 9  0 - 37 U/L   ALT 5  0 - 53 U/L   Alkaline Phosphatase 87  39 - 117 U/L   Total Bilirubin 0.5  0.3 - 1.2 mg/dL   GFR calc non Af Amer 37 (*) >90 mL/min   GFR calc Af Amer 43 (*) >90 mL/min   Comment: (NOTE)     The eGFR has been calculated using the CKD EPI equation.     This calculation has not been validated in all  clinical situations.     eGFR's persistently <90 mL/min signify possible Chronic Kidney     Disease.  PRO B NATRIURETIC PEPTIDE     Status: Abnormal   Collection Time    03/23/14  9:15 PM      Result Value Ref Range   Pro B Natriuretic peptide (BNP) 3416.0 (*) 0 - 450 pg/mL  MAGNESIUM     Status: None   Collection Time    03/23/14  9:15 PM      Result Value Ref Range   Magnesium 2.2  1.5 - 2.5 mg/dL  PROTIME-INR     Status: None   Collection Time    03/23/14  9:15 PM      Result Value Ref Range   Prothrombin Time 14.2  11.6 - 15.2 seconds   INR 1.10  0.00 - 1.49  TROPONIN I     Status: None   Collection Time    03/23/14  9:15 PM      Result Value Ref Range   Troponin I <0.30  <0.30 ng/mL   Comment:            Due to the release kinetics of cTnI,     a negative result within the first hours     of the onset of symptoms does not rule out     myocardial infarction with certainty.  If myocardial infarction is still suspected,     repeat the test at appropriate intervals.  TYPE AND SCREEN     Status: None   Collection Time    03/23/14 10:08 PM      Result Value Ref Range   ABO/RH(D) A NEG     Antibody Screen NEG     Sample Expiration 03/26/2014     Unit Number [id number]     Blood Component Type RBC, LR IRR     Unit division 00     Status of Unit ISSUED,FINAL     Transfusion Status OK TO TRANSFUSE     Crossmatch Result Compatible     Unit Number [id number]     Blood Component Type RBC, LR IRR     Unit division 00     Status of Unit ISSUED,FINAL     Transfusion Status OK TO TRANSFUSE     Crossmatch Result Compatible    PREPARE RBC (CROSSMATCH)     Status: None   Collection Time    03/23/14 10:42 PM      Result Value Ref Range   Order Confirmation ORDER PROCESSED BY BLOOD BANK    GLUCOSE, CAPILLARY     Status: Abnormal   Collection Time    03/24/14 12:05 AM      Result Value Ref Range   Glucose-Capillary 149 (*) 70 - 99 mg/dL   Comment 1 Notify RN     MRSA PCR SCREENING     Status: None   Collection Time    03/24/14  2:27 AM      Result Value Ref Range   MRSA by PCR NEGATIVE  NEGATIVE   Comment:            The GeneXpert MRSA Assay (FDA     approved for NASAL specimens     only), is one component of a     comprehensive MRSA colonization     surveillance program. It is not     intended to diagnose MRSA     infection nor to guide or     monitor treatment for     MRSA infections.  GLUCOSE, CAPILLARY     Status: Abnormal   Collection Time    03/24/14  7:28 AM      Result Value Ref Range   Glucose-Capillary 115 (*) 70 - 99 mg/dL   Comment 1 Notify RN    TROPONIN I     Status: None   Collection Time    03/24/14 11:52 AM      Result Value Ref Range   Troponin I <0.30  <0.30 ng/mL   Comment:            Due to the release kinetics of cTnI,     a negative result within the first hours     of the onset of symptoms does not rule out     myocardial infarction with certainty.     If myocardial infarction is still suspected,     repeat the test at appropriate intervals.  CBC WITH DIFFERENTIAL     Status: Abnormal   Collection Time    03/24/14 11:52 AM      Result Value Ref Range   WBC 2.8 (*) 4.0 - 10.5 K/uL   RBC 3.06 (*) 4.22 - 5.81 MIL/uL   Hemoglobin 9.7 (*) 13.0 - 17.0 g/dL   Comment: DELTA CHECK NOTED     POST TRANSFUSION SPECIMEN   HCT 27.5 (*) 39.0 -  52.0 %   MCV 89.9  78.0 - 100.0 fL   MCH 31.7  26.0 - 34.0 pg   MCHC 35.3  30.0 - 36.0 g/dL   RDW 16.5 (*) 11.5 - 15.5 %   Platelets 90 (*) 150 - 400 K/uL   Comment: SPECIMEN CHECKED FOR CLOTS     PLATELET COUNT CONFIRMED BY SMEAR   Neutrophils Relative % 47  43 - 77 %   Neutro Abs 1.3 (*) 1.7 - 7.7 K/uL   Lymphocytes Relative 42  12 - 46 %   Lymphs Abs 1.2  0.7 - 4.0 K/uL   Monocytes Relative 3  3 - 12 %   Monocytes Absolute 0.1  0.1 - 1.0 K/uL   Eosinophils Relative 9 (*) 0 - 5 %   Eosinophils Absolute 0.2  0.0 - 0.7 K/uL   Basophils Relative 0  0 - 1 %    Basophils Absolute 0.0  0.0 - 0.1 K/uL  BASIC METABOLIC PANEL     Status: Abnormal   Collection Time    03/24/14 11:52 AM      Result Value Ref Range   Sodium 139  137 - 147 mEq/L   Potassium 4.4  3.7 - 5.3 mEq/L   Chloride 101  96 - 112 mEq/L   CO2 28  19 - 32 mEq/L   Glucose, Bld 127 (*) 70 - 99 mg/dL   BUN 30 (*) 6 - 23 mg/dL   Creatinine, Ser 1.62 (*) 0.50 - 1.35 mg/dL   Calcium 9.0  8.4 - 10.5 mg/dL   GFR calc non Af Amer 38 (*) >90 mL/min   GFR calc Af Amer 45 (*) >90 mL/min   Comment: (NOTE)     The eGFR has been calculated using the CKD EPI equation.     This calculation has not been validated in all clinical situations.     eGFR's persistently <90 mL/min signify possible Chronic Kidney     Disease.  GLUCOSE, CAPILLARY     Status: Abnormal   Collection Time    03/24/14 12:08 PM      Result Value Ref Range   Glucose-Capillary 121 (*) 70 - 99 mg/dL   Comment 1 Notify RN    GLUCOSE, CAPILLARY     Status: Abnormal   Collection Time    03/24/14  4:05 PM      Result Value Ref Range   Glucose-Capillary 161 (*) 70 - 99 mg/dL   Comment 1 Notify RN    TROPONIN I     Status: None   Collection Time    03/24/14  5:34 PM      Result Value Ref Range   Troponin I <0.30  <0.30 ng/mL   Comment:            Due to the release kinetics of cTnI,     a negative result within the first hours     of the onset of symptoms does not rule out     myocardial infarction with certainty.     If myocardial infarction is still suspected,     repeat the test at appropriate intervals.  TROPONIN I     Status: None   Collection Time    03/24/14 11:39 PM      Result Value Ref Range   Troponin I <0.30  <0.30 ng/mL   Comment:            Due to the release kinetics of cTnI,  a negative result within the first hours     of the onset of symptoms does not rule out     myocardial infarction with certainty.     If myocardial infarction is still suspected,     repeat the test at appropriate  intervals.  CBC WITH DIFFERENTIAL     Status: Abnormal   Collection Time    03/25/14  5:12 AM      Result Value Ref Range   WBC 3.2 (*) 4.0 - 10.5 K/uL   RBC 3.10 (*) 4.22 - 5.81 MIL/uL   Hemoglobin 9.5 (*) 13.0 - 17.0 g/dL   HCT 27.9 (*) 39.0 - 52.0 %   MCV 90.0  78.0 - 100.0 fL   MCH 30.6  26.0 - 34.0 pg   MCHC 34.1  30.0 - 36.0 g/dL   RDW 16.8 (*) 11.5 - 15.5 %   Platelets 87 (*) 150 - 400 K/uL   Comment: SPECIMEN CHECKED FOR CLOTS     PLATELET COUNT CONFIRMED BY SMEAR   Neutrophils Relative % 39 (*) 43 - 77 %   Neutro Abs 1.2 (*) 1.7 - 7.7 K/uL   Lymphocytes Relative 49 (*) 12 - 46 %   Lymphs Abs 1.6  0.7 - 4.0 K/uL   Monocytes Relative 4  3 - 12 %   Monocytes Absolute 0.1  0.1 - 1.0 K/uL   Eosinophils Relative 7 (*) 0 - 5 %   Eosinophils Absolute 0.2  0.0 - 0.7 K/uL   Basophils Relative 1  0 - 1 %   Basophils Absolute 0.0  0.0 - 0.1 K/uL  BASIC METABOLIC PANEL     Status: Abnormal   Collection Time    03/25/14  5:12 AM      Result Value Ref Range   Sodium 142  137 - 147 mEq/L   Potassium 4.0  3.7 - 5.3 mEq/L   Chloride 101  96 - 112 mEq/L   CO2 28  19 - 32 mEq/L   Glucose, Bld 115 (*) 70 - 99 mg/dL   BUN 29 (*) 6 - 23 mg/dL   Creatinine, Ser 1.57 (*) 0.50 - 1.35 mg/dL   Calcium 9.2  8.4 - 10.5 mg/dL   GFR calc non Af Amer 40 (*) >90 mL/min   GFR calc Af Amer 46 (*) >90 mL/min   Comment: (NOTE)     The eGFR has been calculated using the CKD EPI equation.     This calculation has not been validated in all clinical situations.     eGFR's persistently <90 mL/min signify possible Chronic Kidney     Disease.      RADIOGRAPHY: Dg Chest Portable 1 View  03/23/2014   CLINICAL DATA:  Shortness of breath  EXAM: PORTABLE CHEST - 1 VIEW  COMPARISON:  Chest radiograph 02/14/2014  FINDINGS: Status post median sternotomy. Stable enlarged cardiac and mediastinal contours. Elevation of the right hemidiaphragm. No consolidative pulmonary opacities. No pleural effusion or  pneumothorax.  IMPRESSION: Cardiomegaly.  No acute cardiopulmonary process.   Electronically Signed   By: Lovey Newcomer M.D.   On: 03/23/2014 22:37       PATHOLOGY:  None   ASSESSMENT:  1. Chest pain/pressure 2. Acute anemia, S/P 2 unit PRBC transfusion on 03/23/2014 with an appropriate response. 3. 5Q- MDS,. Revlimid on hold due to progressively worse renal function on 03/07/2014.  Patient Active Problem List   Diagnosis Date Noted  . Chest pain 03/23/2014  . CHF (congestive heart  failure) 02/12/2014  . Exertional dyspnea 02/12/2014  . Chest pain of uncertain etiology 59/53/9672  . Diabetes 01/01/2014  . Chronic kidney disease (CKD) stage G3b/A2, moderately decreased glomerular filtration rate (GFR) between 30-44 mL/min/1.73 square meter and albuminuria creatinine ratio between 30-299 mg/g 12/14/2013  . NSTEMI (non-ST elevated myocardial infarction) 11/28/2013  . HCAP (healthcare-associated pneumonia) 11/27/2013  . Community acquired pneumonia 11/27/2013  . Acute on chronic systolic heart failure 89/79/1504  . Other pancytopenia 01/08/2013  . Non-ST elevation MI (NSTEMI) 01/06/2013  . High output heart failure 10/25/2012  . Myelodysplastic syndrome with 5 q minus 10/17/2012  . History of colonic polyps 02/28/2012  . Anemia 01/10/2012  . Angina effort 01/06/2012  . AVM (arteriovenous malformation) of colon without hemorrhage 01/04/2012  . GI bleeding 10/09/2011  . Long term current use of anticoagulant 12/22/2010  . CAROTID ARTERY STENOSIS 10/16/2010  . DYSLIPIDEMIA 04/11/2009  . CAD 04/11/2009  . Atrial fibrillation 04/11/2009  . PVD 04/11/2009  . OSTEOARTHRITIS 04/11/2009    PLAN:  1. I personally reviewed and went over laboratory results with the patient.  The results are noted within this dictation. 2. I personally reviewed and went over radiographic studies with the patient.  The results are noted within this dictation.   3. Labs today: Anemia panel, EPO level today 4.  Recommend FOB cards as an outpatient to evaluate for GI bleed.  Dr. Luan Pulling agreed to order at time of discharge. 5. Procrit 40,000 units today 6. Outpatient supportive therapy plan built for Procrit 40,000 units every 2 weeks. 7. Procrit 40,000 units in 2 weeks at the [facility] 8. Hold Revlimid for now with plans to revise schedule of administration to optimize control of MDS and at the same time not further compromise bone marrow function. 9. Follow-up as scheduled on 7/23 at the [facility].  All questions were answered. The patient knows to call the clinic with any problems, questions or concerns. We can certainly see the patient much sooner if necessary.  Patient and plan discussed with Dr. Farrel Gobble and he is in agreement with the aforementioned.    KEFALAS,THOMAS 03/25/2014

## 2014-03-25 NOTE — Discharge Summary (Signed)
Physician Discharge Summary  Patient ID: [name] MRN: [id number] DOB/AGE: 78-Oct-1933 78 y.o. Primary Care Physician:Muhamad Serano L, MD Admit date: 03/23/2014 Discharge date: 03/25/2014    Discharge Diagnoses:   Principal Problem:   Angina effort Active Problems:   CAD   Atrial fibrillation   AVM (arteriovenous malformation) of colon without hemorrhage   Anemia   Myelodysplastic syndrome with 5 q minus   High output heart failure   Chronic kidney disease (CKD) stage G3b/A2, moderately decreased glomerular filtration rate (GFR) between 30-44 mL/min/1.73 square meter and albuminuria creatinine ratio between 30-299 mg/g   Chest pain     Medication List         acetaminophen 500 MG tablet  Commonly known as:  TYLENOL  Take 500 mg by mouth every 6 (six) hours as needed for moderate pain.     albuterol 108 (90 BASE) MCG/ACT inhaler  Commonly known as:  PROVENTIL HFA;VENTOLIN HFA  Inhale 2 puffs into the lungs every 6 (six) hours as needed for wheezing.     amiodarone 200 MG tablet  Commonly known as:  PACERONE  Take 0.5 tablets (100 mg total) by mouth daily.     amitriptyline 10 MG tablet  Commonly known as:  ELAVIL  Take 30 mg by mouth at bedtime.     amLODipine 5 MG tablet  Commonly known as:  NORVASC  Take 5 mg by mouth daily.     aspirin EC 325 MG tablet  Take 325 mg by mouth daily.     diazepam 5 MG tablet  Commonly known as:  VALIUM  Take one tablet by mouth every 6 hours as needed for muscle spasms     furosemide 40 MG tablet  Commonly known as:  LASIX  Take 20-40 mg by mouth daily. Take one half tablet daily on a routine basis. If your weight goes up by 3 pounds or if you have edema of your legs take one full tablet twice a day for 3 days     HYDROcodone-acetaminophen 5-325 MG per tablet  Commonly known as:  NORCO/VICODIN  Take 1 by mouth every 6 hours as needed for pain. *MAX APAP=3GM/24 HRS- ALL SOURCES*     ipratropium-albuterol 0.5-2.5 (3)  MG/3ML Soln  Commonly known as:  DUONEB  Take 3 mLs by nebulization every 6 (six) hours as needed (shortness of breath).     isosorbide mononitrate 30 MG 24 hr tablet  Commonly known as:  IMDUR  Take 1 tablet (30 mg total) by mouth daily.     metFORMIN 500 MG tablet  Commonly known as:  GLUCOPHAGE  Take 500 mg by mouth 2 (two) times daily with a meal.     metoprolol tartrate 25 MG tablet  Commonly known as:  LOPRESSOR  Take 25 mg by mouth 2 (two) times daily.     multivitamin tablet  Take 1 tablet by mouth daily.     nitroGLYCERIN 0.4 MG SL tablet  Commonly known as:  NITROSTAT  Place 1 tablet (0.4 mg total) under the tongue every 5 (five) minutes x 3 doses as needed for chest pain (up to 3 doses).     potassium chloride SA 20 MEQ tablet  Commonly known as:  K-DUR,KLOR-CON  Take 1 tablet (20 mEq total) by mouth daily.     pregabalin 75 MG capsule  Commonly known as:  LYRICA  Take 1 capsule (75 mg total) by mouth daily.     RAPAFLO 8 MG Caps capsule  Generic  drug:  silodosin  Take 8 mg by mouth daily with breakfast.     Red Yeast Rice 600 MG Tabs  Take 1 tablet by mouth daily.        Discharged Condition: Improved    Consults: Oncology  Significant Diagnostic Studies: Dg Chest Portable 1 View  03/23/2014   CLINICAL DATA:  Shortness of breath  EXAM: PORTABLE CHEST - 1 VIEW  COMPARISON:  Chest radiograph 02/14/2014  FINDINGS: Status post median sternotomy. Stable enlarged cardiac and mediastinal contours. Elevation of the right hemidiaphragm. No consolidative pulmonary opacities. No pleural effusion or pneumothorax.  IMPRESSION: Cardiomegaly.  No acute cardiopulmonary process.   Electronically Signed   By: Lovey Newcomer M.D.   On: 03/23/2014 22:37    Lab Results: Basic Metabolic Panel:  Recent Labs  03/23/14 2115 03/24/14 1152 03/25/14 0512  NA 141 139 142  K 4.0 4.4 4.0  CL 103 101 101  CO2 25 28 28   GLUCOSE 164* 127* 115*  BUN 33* 30* 29*  CREATININE  1.68* 1.62* 1.57*  CALCIUM 8.9 9.0 9.2  MG 2.2  --   --    Liver Function Tests:  Recent Labs  03/23/14 2115  AST 9  ALT 5  ALKPHOS 87  BILITOT 0.5  PROT 6.7  ALBUMIN 3.4*     CBC:  Recent Labs  03/24/14 1152 03/25/14 0512  WBC 2.8* 3.2*  NEUTROABS 1.3* 1.2*  HGB 9.7* 9.5*  HCT 27.5* 27.9*  MCV 89.9 90.0  PLT 90* 87*    Recent Results (from the past 240 hour(s))  MRSA PCR SCREENING     Status: None   Collection Time    03/24/14  2:27 AM      Result Value Ref Range Status   MRSA by PCR NEGATIVE  NEGATIVE Final   Comment:            The GeneXpert MRSA Assay (FDA     approved for NASAL specimens     only), is one component of a     comprehensive MRSA colonization     surveillance program. It is not     intended to diagnose MRSA     infection nor to guide or     monitor treatment for     MRSA infections.     Hospital Course: He came to the emergency department after having had chest discomfort at home. This resolved initially with nitroglycerin but then returned. When he came to the emergency department he was noted to be anemic. He has had multiple episodes of chest pain related to anemia. He has anemia related to his myelodysplastic disorder with which is 5 q. minus. He had been treated for that but is no longer a candidate for treatment because of his renal function. He has required blood transfusions on multiple occasions. He was transfused with 2 units of packed red blood cells and once he was placed on oxygen his symptoms resolved. He ruled out for myocardial infarction. His hemoglobin level was 9.5 time of discharge. He received Procrit before he was discharged. He will followup in the oncology clinic for Procrit injections and he will have stools for blood checked as an outpatient.  Discharge Exam: Blood pressure 121/40, pulse 53, temperature 97.1 F (36.2 C), temperature source Oral, resp. rate 20, height 5\' 11"  (1.803 m), weight 84.868 kg (187 lb 1.6 oz),  SpO2 94.00%. He is awake and alert. Chest is clear. His heart is irregular as always. He looks comfortable  Disposition: Home he will following the oncology clinic and in my office. He will obtain stool cards from my office to check for occult blood      Discharge Instructions   Discharge patient    Complete by:  As directed   After erythropoeitin given (procrit)             Signed: Ahrianna Siglin L   03/25/2014, 8:48 AM

## 2014-03-25 NOTE — Progress Notes (Signed)
Subjective: He feels much better. He has no new complaints. His hemoglobin level is 9.5. Oncology input is noted and appreciated  Objective: Vital signs in last 24 hours: Temp:  [97.1 F (36.2 C)-97.6 F (36.4 C)] 97.1 F (36.2 C) (06/29 0448) Pulse Rate:  [53-61] 53 (06/29 0448) Resp:  [20] 20 (06/29 0448) BP: (105-145)/(40-61) 121/40 mmHg (06/29 0448) SpO2:  [94 %-100 %] 94 % (06/29 0448) Weight change:  Last BM Date: 03/23/14  Intake/Output from previous day: 06/28 0701 - 06/29 0700 In: 600 [P.O.:600] Out: 800 [Urine:800]  PHYSICAL EXAM General appearance: alert, cooperative and no distress Resp: clear to auscultation bilaterally Cardio: He is in atrial fibrillation with well-controlled ventricular response GI: soft, non-tender; bowel sounds normal; no masses,  no organomegaly Extremities: extremities normal, atraumatic, no cyanosis or edema  Lab Results:  Results for orders placed during the hospital encounter of 03/23/14 (from the past 48 hour(s))  CBC     Status: Abnormal   Collection Time    03/23/14  9:15 PM      Result Value Ref Range   WBC 2.9 (*) 4.0 - 10.5 K/uL   RBC 2.34 (*) 4.22 - 5.81 MIL/uL   Hemoglobin 7.5 (*) 13.0 - 17.0 g/dL   HCT 21.8 (*) 39.0 - 52.0 %   MCV 93.2  78.0 - 100.0 fL   MCH 32.1  26.0 - 34.0 pg   MCHC 34.4  30.0 - 36.0 g/dL   RDW 16.7 (*) 11.5 - 15.5 %   Platelets 86 (*) 150 - 400 K/uL   Comment: SPECIMEN CHECKED FOR CLOTS  COMPREHENSIVE METABOLIC PANEL     Status: Abnormal   Collection Time    03/23/14  9:15 PM      Result Value Ref Range   Sodium 141  137 - 147 mEq/L   Potassium 4.0  3.7 - 5.3 mEq/L   Chloride 103  96 - 112 mEq/L   CO2 25  19 - 32 mEq/L   Glucose, Bld 164 (*) 70 - 99 mg/dL   BUN 33 (*) 6 - 23 mg/dL   Creatinine, Ser 1.68 (*) 0.50 - 1.35 mg/dL   Calcium 8.9  8.4 - 10.5 mg/dL   Total Protein 6.7  6.0 - 8.3 g/dL   Albumin 3.4 (*) 3.5 - 5.2 g/dL   AST 9  0 - 37 U/L   ALT 5  0 - 53 U/L   Alkaline Phosphatase 87   39 - 117 U/L   Total Bilirubin 0.5  0.3 - 1.2 mg/dL   GFR calc non Af Amer 37 (*) >90 mL/min   GFR calc Af Amer 43 (*) >90 mL/min   Comment: (NOTE)     The eGFR has been calculated using the CKD EPI equation.     This calculation has not been validated in all clinical situations.     eGFR's persistently <90 mL/min signify possible Chronic Kidney     Disease.  PRO B NATRIURETIC PEPTIDE     Status: Abnormal   Collection Time    03/23/14  9:15 PM      Result Value Ref Range   Pro B Natriuretic peptide (BNP) 3416.0 (*) 0 - 450 pg/mL  MAGNESIUM     Status: None   Collection Time    03/23/14  9:15 PM      Result Value Ref Range   Magnesium 2.2  1.5 - 2.5 mg/dL  PROTIME-INR     Status: None   Collection Time  03/23/14  9:15 PM      Result Value Ref Range   Prothrombin Time 14.2  11.6 - 15.2 seconds   INR 1.10  0.00 - 1.49  TROPONIN I     Status: None   Collection Time    03/23/14  9:15 PM      Result Value Ref Range   Troponin I <0.30  <0.30 ng/mL   Comment:            Due to the release kinetics of cTnI,     a negative result within the first hours     of the onset of symptoms does not rule out     myocardial infarction with certainty.     If myocardial infarction is still suspected,     repeat the test at appropriate intervals.  TYPE AND SCREEN     Status: None   Collection Time    03/23/14 10:08 PM      Result Value Ref Range   ABO/RH(D) A NEG     Antibody Screen NEG     Sample Expiration 03/26/2014     Unit Number [id number]     Blood Component Type RBC, LR IRR     Unit division 00     Status of Unit ISSUED,FINAL     Transfusion Status OK TO TRANSFUSE     Crossmatch Result Compatible     Unit Number [id number]     Blood Component Type RBC, LR IRR     Unit division 00     Status of Unit ISSUED,FINAL     Transfusion Status OK TO TRANSFUSE     Crossmatch Result Compatible    PREPARE RBC (CROSSMATCH)     Status: None   Collection Time    03/23/14 10:42 PM       Result Value Ref Range   Order Confirmation ORDER PROCESSED BY BLOOD BANK    GLUCOSE, CAPILLARY     Status: Abnormal   Collection Time    03/24/14 12:05 AM      Result Value Ref Range   Glucose-Capillary 149 (*) 70 - 99 mg/dL   Comment 1 Notify RN    MRSA PCR SCREENING     Status: None   Collection Time    03/24/14  2:27 AM      Result Value Ref Range   MRSA by PCR NEGATIVE  NEGATIVE   Comment:            The GeneXpert MRSA Assay (FDA     approved for NASAL specimens     only), is one component of a     comprehensive MRSA colonization     surveillance program. It is not     intended to diagnose MRSA     infection nor to guide or     monitor treatment for     MRSA infections.  GLUCOSE, CAPILLARY     Status: Abnormal   Collection Time    03/24/14  7:28 AM      Result Value Ref Range   Glucose-Capillary 115 (*) 70 - 99 mg/dL   Comment 1 Notify RN    TROPONIN I     Status: None   Collection Time    03/24/14 11:52 AM      Result Value Ref Range   Troponin I <0.30  <0.30 ng/mL   Comment:            Due to the release kinetics of cTnI,  a negative result within the first hours     of the onset of symptoms does not rule out     myocardial infarction with certainty.     If myocardial infarction is still suspected,     repeat the test at appropriate intervals.  CBC WITH DIFFERENTIAL     Status: Abnormal   Collection Time    03/24/14 11:52 AM      Result Value Ref Range   WBC 2.8 (*) 4.0 - 10.5 K/uL   RBC 3.06 (*) 4.22 - 5.81 MIL/uL   Hemoglobin 9.7 (*) 13.0 - 17.0 g/dL   Comment: DELTA CHECK NOTED     POST TRANSFUSION SPECIMEN   HCT 27.5 (*) 39.0 - 52.0 %   MCV 89.9  78.0 - 100.0 fL   MCH 31.7  26.0 - 34.0 pg   MCHC 35.3  30.0 - 36.0 g/dL   RDW 16.5 (*) 11.5 - 15.5 %   Platelets 90 (*) 150 - 400 K/uL   Comment: SPECIMEN CHECKED FOR CLOTS     PLATELET COUNT CONFIRMED BY SMEAR   Neutrophils Relative % 47  43 - 77 %   Neutro Abs 1.3 (*) 1.7 - 7.7 K/uL    Lymphocytes Relative 42  12 - 46 %   Lymphs Abs 1.2  0.7 - 4.0 K/uL   Monocytes Relative 3  3 - 12 %   Monocytes Absolute 0.1  0.1 - 1.0 K/uL   Eosinophils Relative 9 (*) 0 - 5 %   Eosinophils Absolute 0.2  0.0 - 0.7 K/uL   Basophils Relative 0  0 - 1 %   Basophils Absolute 0.0  0.0 - 0.1 K/uL  BASIC METABOLIC PANEL     Status: Abnormal   Collection Time    03/24/14 11:52 AM      Result Value Ref Range   Sodium 139  137 - 147 mEq/L   Potassium 4.4  3.7 - 5.3 mEq/L   Chloride 101  96 - 112 mEq/L   CO2 28  19 - 32 mEq/L   Glucose, Bld 127 (*) 70 - 99 mg/dL   BUN 30 (*) 6 - 23 mg/dL   Creatinine, Ser 1.62 (*) 0.50 - 1.35 mg/dL   Calcium 9.0  8.4 - 10.5 mg/dL   GFR calc non Af Amer 38 (*) >90 mL/min   GFR calc Af Amer 45 (*) >90 mL/min   Comment: (NOTE)     The eGFR has been calculated using the CKD EPI equation.     This calculation has not been validated in all clinical situations.     eGFR's persistently <90 mL/min signify possible Chronic Kidney     Disease.  GLUCOSE, CAPILLARY     Status: Abnormal   Collection Time    03/24/14 12:08 PM      Result Value Ref Range   Glucose-Capillary 121 (*) 70 - 99 mg/dL   Comment 1 Notify RN    GLUCOSE, CAPILLARY     Status: Abnormal   Collection Time    03/24/14  4:05 PM      Result Value Ref Range   Glucose-Capillary 161 (*) 70 - 99 mg/dL   Comment 1 Notify RN    TROPONIN I     Status: None   Collection Time    03/24/14  5:34 PM      Result Value Ref Range   Troponin I <0.30  <0.30 ng/mL   Comment:  Due to the release kinetics of cTnI,     a negative result within the first hours     of the onset of symptoms does not rule out     myocardial infarction with certainty.     If myocardial infarction is still suspected,     repeat the test at appropriate intervals.  TROPONIN I     Status: None   Collection Time    03/24/14 11:39 PM      Result Value Ref Range   Troponin I <0.30  <0.30 ng/mL   Comment:            Due  to the release kinetics of cTnI,     a negative result within the first hours     of the onset of symptoms does not rule out     myocardial infarction with certainty.     If myocardial infarction is still suspected,     repeat the test at appropriate intervals.  CBC WITH DIFFERENTIAL     Status: Abnormal   Collection Time    03/25/14  5:12 AM      Result Value Ref Range   WBC 3.2 (*) 4.0 - 10.5 K/uL   RBC 3.10 (*) 4.22 - 5.81 MIL/uL   Hemoglobin 9.5 (*) 13.0 - 17.0 g/dL   HCT 48.4 (*) 74.2 - 29.4 %   MCV 90.0  78.0 - 100.0 fL   MCH 30.6  26.0 - 34.0 pg   MCHC 34.1  30.0 - 36.0 g/dL   RDW 63.9 (*) 20.2 - 25.7 %   Platelets 87 (*) 150 - 400 K/uL   Comment: SPECIMEN CHECKED FOR CLOTS     PLATELET COUNT CONFIRMED BY SMEAR   Neutrophils Relative % 39 (*) 43 - 77 %   Neutro Abs 1.2 (*) 1.7 - 7.7 K/uL   Lymphocytes Relative 49 (*) 12 - 46 %   Lymphs Abs 1.6  0.7 - 4.0 K/uL   Monocytes Relative 4  3 - 12 %   Monocytes Absolute 0.1  0.1 - 1.0 K/uL   Eosinophils Relative 7 (*) 0 - 5 %   Eosinophils Absolute 0.2  0.0 - 0.7 K/uL   Basophils Relative 1  0 - 1 %   Basophils Absolute 0.0  0.0 - 0.1 K/uL  BASIC METABOLIC PANEL     Status: Abnormal   Collection Time    03/25/14  5:12 AM      Result Value Ref Range   Sodium 142  137 - 147 mEq/L   Potassium 4.0  3.7 - 5.3 mEq/L   Chloride 101  96 - 112 mEq/L   CO2 28  19 - 32 mEq/L   Glucose, Bld 115 (*) 70 - 99 mg/dL   BUN 29 (*) 6 - 23 mg/dL   Creatinine, Ser 6.98 (*) 0.50 - 1.35 mg/dL   Calcium 9.2  8.4 - 64.3 mg/dL   GFR calc non Af Amer 40 (*) >90 mL/min   GFR calc Af Amer 46 (*) >90 mL/min   Comment: (NOTE)     The eGFR has been calculated using the CKD EPI equation.     This calculation has not been validated in all clinical situations.     eGFR's persistently <90 mL/min signify possible Chronic Kidney     Disease.    ABGS No results found for this basename: PHART, PCO2, PO2ART, TCO2, HCO3,  in the last 72  hours CULTURES Recent Results (from the past 240  hour(s))  MRSA PCR SCREENING     Status: None   Collection Time    03/24/14  2:27 AM      Result Value Ref Range Status   MRSA by PCR NEGATIVE  NEGATIVE Final   Comment:            The GeneXpert MRSA Assay (FDA     approved for NASAL specimens     only), is one component of a     comprehensive MRSA colonization     surveillance program. It is not     intended to diagnose MRSA     infection nor to guide or     monitor treatment for     MRSA infections.   Studies/Results: Dg Chest Portable 1 View  03/23/2014   CLINICAL DATA:  Shortness of breath  EXAM: PORTABLE CHEST - 1 VIEW  COMPARISON:  Chest radiograph 02/14/2014  FINDINGS: Status post median sternotomy. Stable enlarged cardiac and mediastinal contours. Elevation of the right hemidiaphragm. No consolidative pulmonary opacities. No pleural effusion or pneumothorax.  IMPRESSION: Cardiomegaly.  No acute cardiopulmonary process.   Electronically Signed   By: Lovey Newcomer M.D.   On: 03/23/2014 22:37    Medications:  Prior to Admission:  Prescriptions prior to admission  Medication Sig Dispense Refill  . albuterol (PROVENTIL HFA;VENTOLIN HFA) 108 (90 BASE) MCG/ACT inhaler Inhale 2 puffs into the lungs every 6 (six) hours as needed for wheezing.  1 Inhaler  2  . amiodarone (PACERONE) 200 MG tablet Take 0.5 tablets (100 mg total) by mouth daily.  30 tablet  5  . amitriptyline (ELAVIL) 10 MG tablet Take 30 mg by mouth at bedtime.      [place] Kitchen amLODipine (NORVASC) 5 MG tablet Take 5 mg by mouth daily.      Mar[place] Kitchen aspirin EC 325 MG tablet Take 325 mg by mouth daily.      . diazepam (VALIUM) 5 MG tablet Take one tablet by mouth every 6 hours as needed for muscle spasms  120 tablet  5  . furosemide (LASIX) 40 MG tablet Take 20-40 mg by mouth daily. Take one half tablet daily on a routine basis. If your weight goes up by 3 pounds or if you have edema of your legs take one full tablet twice a day for 3 days       . HYDROcodone-acetaminophen (NORCO/VICODIN) 5-325 MG per tablet Take 1 by mouth every 6 hours as needed for pain. *MAX APAP=3GM/24 HRS- ALL SOURCES*  120 tablet  0  . isosorbide mononitrate (IMDUR) 30 MG 24 hr tablet Take 1 tablet (30 mg total) by mouth daily.  30 tablet  12  . metFORMIN (GLUCOPHAGE) 500 MG tablet Take 500 mg by mouth 2 (two) times daily with a meal.      . metoprolol tartrate (LOPRESSOR) 25 MG tablet Take 25 mg by mouth 2 (two) times daily.        . Multiple Vitamin (MULTIVITAMIN) tablet Take 1 tablet by mouth daily.        . nitroGLYCERIN (NITROSTAT) 0.4 MG SL tablet Place 1 tablet (0.4 mg total) under the tongue every 5 (five) minutes x 3 doses as needed for chest pain (up to 3 doses).  25 tablet  3  . potassium chloride SA (K-DUR,KLOR-CON) 20 MEQ tablet Take 1 tablet (20 mEq total) by mouth daily.  30 tablet  12  . pregabalin (LYRICA) 75 MG capsule Take 1 capsule (75 mg total) by mouth daily.  [place]  capsule  5  . Red Yeast Rice 600 MG TABS Take 1 tablet by mouth daily.        . silodosin (RAPAFLO) 8 MG CAPS capsule Take 8 mg by mouth daily with breakfast.        . acetaminophen (TYLENOL) 500 MG tablet Take 500 mg by mouth every 6 (six) hours as needed for moderate pain.      Marland Kitchen ipratropium-albuterol (DUONEB) 0.5-2.5 (3) MG/3ML SOLN Take 3 mLs by nebulization every 6 (six) hours as needed (shortness of breath).  360 mL  5   Scheduled: . amiodarone  100 mg Oral Daily  . amitriptyline  30 mg Oral QHS  . amLODipine  5 mg Oral Daily  . aspirin EC  325 mg Oral Daily  . epoetin alfa  40,000 Units Subcutaneous Once  . furosemide  20 mg Oral Daily  . isosorbide mononitrate  30 mg Oral Daily  . metoprolol tartrate  25 mg Oral BID  . potassium chloride SA  20 mEq Oral Daily  . pregabalin  75 mg Oral Daily   Continuous:  UIQ:[id number], acetaminophen, diazepam, ipratropium-albuterol, nitroGLYCERIN, ondansetron (ZOFRAN) IV  Assesment: He was admitted with chest pain related to  being anemic. He has received blood transfusion and his hemoglobin level is near goal. He has multiple other medical problems as noted below. He may be a candidate for Procrit and that has been discussed with oncology and they plan to start that. He has chronic atrial fib and is not a candidate for anticoagulation because of chronic bleeding. He has chronic kidney disease which I think is about the same. Principal Problem:   Angina effort Active Problems:   CAD   Atrial fibrillation   AVM (arteriovenous malformation) of colon without hemorrhage   Anemia   Myelodysplastic syndrome with 5 q minus   High output heart failure   Chronic kidney disease (CKD) stage G3b/A2, moderately decreased glomerular filtration rate (GFR) between 30-44 mL/min/1.73 square meter and albuminuria creatinine ratio between 30-299 mg/g   Chest pain    Plan: He can go home today. He will need to have stools checked for blood.    LOS: 2 days   HAWKINS,EDWARD L 03/25/2014, 8:30 AM

## 2014-03-26 ENCOUNTER — Encounter (HOSPITAL_COMMUNITY): Payer: Medicare Other

## 2014-03-26 ENCOUNTER — Encounter (HOSPITAL_BASED_OUTPATIENT_CLINIC_OR_DEPARTMENT_OTHER): Payer: Medicare Other

## 2014-03-26 ENCOUNTER — Other Ambulatory Visit (HOSPITAL_COMMUNITY): Payer: Self-pay | Admitting: Oncology

## 2014-03-26 VITALS — BP 123/56 | HR 64 | Temp 97.9°F | Resp 20

## 2014-03-26 DIAGNOSIS — E538 Deficiency of other specified B group vitamins: Secondary | ICD-10-CM

## 2014-03-26 LAB — IRON AND TIBC
Iron: 176 ug/dL — ABNORMAL HIGH (ref 42–135)
Saturation Ratios: 84 % — ABNORMAL HIGH (ref 20–55)
TIBC: 210 ug/dL — ABNORMAL LOW (ref 215–435)
UIBC: 34 ug/dL — AB (ref 125–400)

## 2014-03-26 MED ORDER — CYANOCOBALAMIN 1000 MCG/ML IJ SOLN
INTRAMUSCULAR | Status: AC
  Filled 2014-03-26: qty 1

## 2014-03-26 MED ORDER — CYANOCOBALAMIN 1000 MCG/ML IJ SOLN
1000.0000 ug | Freq: Once | INTRAMUSCULAR | Status: AC
  Administered 2014-03-26: 1000 ug via INTRAMUSCULAR

## 2014-03-26 NOTE — Progress Notes (Signed)
[name] presents today for injection per MD orders. B12 1000 mcg administered IM in right Upper Arm. Administration without incident. Patient tolerated well.

## 2014-03-26 NOTE — Progress Notes (Signed)
Labs drawn for anti parie. ifab

## 2014-03-27 LAB — INTRINSIC FACTOR ANTIBODIES: INTRINSIC FACTOR: NEGATIVE

## 2014-03-27 LAB — ERYTHROPOIETIN: ERYTHROPOIETIN: 504.2 m[IU]/mL — AB (ref 2.6–18.5)

## 2014-03-28 LAB — ANTI-PARIETAL ANTIBODY: Parietal Cell Antibody-IgG: NEGATIVE

## 2014-04-01 ENCOUNTER — Encounter (HOSPITAL_COMMUNITY): Payer: Medicare Other

## 2014-04-01 ENCOUNTER — Encounter (HOSPITAL_COMMUNITY): Payer: Self-pay | Admitting: Oncology

## 2014-04-01 ENCOUNTER — Encounter (HOSPITAL_COMMUNITY): Payer: Medicare Other | Attending: Oncology | Admitting: Oncology

## 2014-04-01 VITALS — BP 123/57 | HR 64 | Temp 97.7°F | Resp 20 | Wt 191.0 lb

## 2014-04-01 DIAGNOSIS — G629 Polyneuropathy, unspecified: Secondary | ICD-10-CM

## 2014-04-01 DIAGNOSIS — D46C Myelodysplastic syndrome with isolated del(5q) chromosomal abnormality: Secondary | ICD-10-CM | POA: Diagnosis present

## 2014-04-01 DIAGNOSIS — G609 Hereditary and idiopathic neuropathy, unspecified: Secondary | ICD-10-CM | POA: Diagnosis present

## 2014-04-01 DIAGNOSIS — N183 Chronic kidney disease, stage 3 unspecified: Secondary | ICD-10-CM

## 2014-04-01 DIAGNOSIS — E538 Deficiency of other specified B group vitamins: Secondary | ICD-10-CM

## 2014-04-01 DIAGNOSIS — N179 Acute kidney failure, unspecified: Secondary | ICD-10-CM

## 2014-04-01 LAB — COMPREHENSIVE METABOLIC PANEL
ALBUMIN: 3.5 g/dL (ref 3.5–5.2)
ALT: 7 U/L (ref 0–53)
ANION GAP: 14 (ref 5–15)
AST: 11 U/L (ref 0–37)
Alkaline Phosphatase: 85 U/L (ref 39–117)
BILIRUBIN TOTAL: 0.5 mg/dL (ref 0.3–1.2)
BUN: 37 mg/dL — AB (ref 6–23)
CO2: 23 mEq/L (ref 19–32)
CREATININE: 1.72 mg/dL — AB (ref 0.50–1.35)
Calcium: 9 mg/dL (ref 8.4–10.5)
Chloride: 105 mEq/L (ref 96–112)
GFR calc non Af Amer: 36 mL/min — ABNORMAL LOW (ref >90)
GFR, EST AFRICAN AMERICAN: 41 mL/min — AB (ref >90)
GLUCOSE: 103 mg/dL — AB (ref 70–99)
Potassium: 5.4 mEq/L — ABNORMAL HIGH (ref 3.7–5.3)
Sodium: 142 mEq/L (ref 137–147)
Total Protein: 6.8 g/dL (ref 6.0–8.3)

## 2014-04-01 LAB — CBC
HEMATOCRIT: 24 % — AB (ref 39.0–52.0)
HEMOGLOBIN: 8.2 g/dL — AB (ref 13.0–17.0)
MCH: 31.2 pg (ref 26.0–34.0)
MCHC: 34.2 g/dL (ref 30.0–36.0)
MCV: 91.3 fL (ref 78.0–100.0)
Platelets: 98 10*3/uL — ABNORMAL LOW (ref 150–400)
RBC: 2.63 MIL/uL — ABNORMAL LOW (ref 4.22–5.81)
RDW: 16 % — ABNORMAL HIGH (ref 11.5–15.5)
WBC: 3.1 10*3/uL — ABNORMAL LOW (ref 4.0–10.5)

## 2014-04-01 LAB — PREPARE RBC (CROSSMATCH)

## 2014-04-01 NOTE — Patient Instructions (Signed)
[facility] Discharge Instructions  RECOMMENDATIONS MADE BY THE CONSULTANT AND ANY TEST RESULTS WILL BE SENT TO YOUR REFERRING PHYSICIAN.  EXAM FINDINGS BY THE PHYSICIAN TODAY AND SIGNS OR SYMPTOMS TO REPORT TO CLINIC OR PRIMARY PHYSICIAN: Exam and findings as discussed by Robynn Pane, PA-C.  Will check labs weekly to monitor your hemoglobin.  Your hemoglobin is 8.2 and we will plan to transfuse you tomorrow. Report increased fatigue or shortness of breath or other concerns.  MEDICATIONS PRESCRIBED:  None  INSTRUCTIONS/FOLLOW-UP: Weekly labs and office visit in 4 weeks.  Thank you for choosing [facility] to provide your oncology and hematology care.  To afford each patient quality time with our providers, please arrive at least 15 minutes before your scheduled appointment time.  With your help, our goal is to use those 15 minutes to complete the necessary work-up to ensure our physicians have the information they need to help with your evaluation and healthcare recommendations.    Effective January 1st, 2014, we ask that you re-schedule your appointment with our physicians should you arrive 10 or more minutes late for your appointment.  We strive to give you quality time with our providers, and arriving late affects you and other patients whose appointments are after yours.    Again, thank you for choosing [facility].  Our hope is that these requests will decrease the amount of time that you wait before being seen by our physicians.       _____________________________________________________________  Should you have questions after your visit to [facility], please contact our office at [phone] between the hours of 8:30 a.m. and 4:30 p.m.  Voicemails left after 4:30 p.m. will not be returned until the following business day.  For prescription refill requests, have your pharmacy contact our office with your prescription  refill request.    _______________________________________________________________  We hope that we have given you very good care.  You may receive a patient satisfaction survey in the mail, please complete it and return it as soon as possible.  We value your feedback!  _______________________________________________________________  Have you asked about our STAR program?  STAR stands for Survivorship Training and Rehabilitation, and this is a nationally recognized cancer care program that focuses on survivorship and rehabilitation.  Cancer and cancer treatments may cause problems, such as, pain, making you feel tired and keeping you from doing the things that you need or want to do. Cancer rehabilitation can help. Our goal is to reduce these troubling effects and help you have the best quality of life possible.  You may receive a survey from a nurse that asks questions about your current state of health.  Based on the survey results, all eligible patients will be referred to the [facility] program for an evaluation so we can better serve you!  A frequently asked questions sheet is available upon request.

## 2014-04-01 NOTE — Progress Notes (Signed)
Caleb Bogus, MD [place] [address]  Myelodysplastic syndrome with 5 q minus - Plan: Comprehensive metabolic panel, CBC, Comprehensive metabolic panel, 0.9 %  sodium chloride infusion, sodium chloride 0.9 % injection 10 mL, heparin lock flush 100 unit/mL, sodium chloride 0.9 % injection 3 mL, Type and screen, Prepare RBC, Transfuse RBC, acetaminophen (TYLENOL) tablet 650 mg, furosemide (LASIX) injection 20 mg, CBC, Comprehensive metabolic panel, Prepare RBC, Type and screen, CANCELED: CBC  CURRENT THERAPY: Observation.  Revlimid on hold due to worsening renal function  INTERVAL HISTORY: [name] 80 y.o. male returns for  regular  visit for followup of 5Q- MDS, with Revlimid on hold due to progressively worse renal function on 03/07/2014.  I personally reviewed and went over laboratory results with the patient.  The results are noted within this dictation.  In the hospital, he was noted to be anemic.  Labs were drawn including an EPO level and ESA therapy was initiated.  He received 40,000 units of Procrit on 6/29.  His EPO level came back a few days later and is reported to be >500.  Therefore, Procrit supportive therapy plan was discontinued.  He would like to remain out of the hospital, as would we.  Therefore, we will check labs every week in hopes of maintaining his Hgb and keeping him out of the hospital.  He is agreeable to this plan.  Hematologically, he denies any complaints and ROS questioning is negative.  Past Medical History  Diagnosis Date  . Essential hypertension, benign   . COPD (chronic obstructive pulmonary disease)   . Coronary atherosclerosis of native coronary artery     Multivessel status post CABG 1996  . Arthritis   . Atrial fibrillation     Coumadin stopped 12/2011 due to anemia  . Dysphagia   . Anemia     Requiring blood transfusions  . Carotid stenosis     Dr Scot Dock   . Diabetes mellitus, type 2   .  Mixed hyperlipidemia     Statin intolerant  . Hematuria     Felt related to Foley insertion  . Cardiomyopathy     LVEF 45-50%  . Melena     EGD & Colonoscopy 09/2011 revealed gastritis, erosions, scars, diverticula, 8 colon polyps (largest 1.6cm, not removed 2/2 anticoagulation), small AVM in transverse colon  . History of tobacco abuse     Quit 1992  . Anxiety   . GERD (gastroesophageal reflux disease)   . Myocardial infarction   . Thrombocytopenia   . Myelodysplastic syndrome with 5 q minus 10/17/2012    On Revlimid 5 mg daily 21 days on and 7 days off  . Aortic stenosis     Mild  . CKD (chronic kidney disease) stage 3, GFR 30-59 ml/min   . Pneumonia 11/2013    history of    has DYSLIPIDEMIA; CAD; Atrial fibrillation; PVD; OSTEOARTHRITIS; CAROTID ARTERY STENOSIS; Long term current use of anticoagulant; GI bleeding; AVM (arteriovenous malformation) of colon without hemorrhage; Angina effort; Anemia; History of colonic polyps; Myelodysplastic syndrome with 5 q minus; High output heart failure; Non-ST elevation MI (NSTEMI); Acute on chronic systolic heart failure; Other pancytopenia; HCAP (healthcare-associated pneumonia); Community acquired pneumonia; NSTEMI (non-ST elevated myocardial infarction); Chronic kidney disease (CKD) stage G3b/A2, moderately decreased glomerular filtration rate (GFR) between 30-44 mL/min/1.73 square meter and albuminuria creatinine ratio between 30-299 mg/g; Diabetes; CHF (congestive heart failure); Exertional dyspnea; Chest pain of uncertain etiology; and Chest pain  on his problem list.     is allergic to statins and tape.  [name] does not currently have medications on file.  Past Surgical History  Procedure Laterality Date  . Knee surgery  1960    Right knee cartilage  . Rotator cuff repair  1990    right   . Inguinal hernia repair  2000 and 2010    left  . Cholecystectomy  05/2010    Lap chole with biologic mesh repair/reinforcement by  Dr  Rise Patience  . Back surgery  02/2006    ruptured disk,  post op diskitis infection requiring prolonged hospital stay and  surgical I & D  . Tonsillectomy    . Esophagogastroduodenoscopy  10/08/2011    Procedure: ESOPHAGOGASTRODUODENOSCOPY (EGD);  Surgeon: [name], MD;  [facility]: [facility];  Service: Endoscopy;  Laterality: N/A;  . Colonoscopy  10/08/2011    Procedure: COLONOSCOPY;  Surgeon: [name], MD;  Location: [facility];  Service: Endoscopy;  Laterality: N/A;  . Cataract extraction, bilateral    . Peg placement  07/2006    placed due to prolonged infection, poor po intake, malnutrition during several week hospital stay resulting from ruptured disc that became infected following surgery  . Colonoscopy  01/14/2012    Procedure: COLONOSCOPY;  Surgeon: [name], MD;  Location: [facility];  Service: Endoscopy;  Laterality: N/A;  945  . Coronary artery bypass graft  1996    CABG x 4  . Ventral hernia repair    . Colonoscopy  04/20/2012    Procedure: COLONOSCOPY;  Surgeon: [name], MD;  Location: [facility];  Service: Endoscopy;  Laterality: N/A;  1030  . Pr vein bypass graft,aorto-fem-pop  1996  . Spine surgery    . Joint replacement  1960    Right Knee  . Lymph node biopsy  08/15/2012    Procedure: LYMPH NODE BIOPSY;  Surgeon: Scherry Ran, MD;  Location: [facility];  Service: General;  Laterality: Left;  Cervical Lymph Node Bx in Minor Room  . Bone marrow biopsy    . Bone marrow aspiration      Denies any headaches, dizziness, double vision, fevers, chills, night sweats, nausea, vomiting, diarrhea, constipation, chest pain, heart palpitations, shortness of breath, blood in stool, black tarry stool, urinary pain, urinary burning, urinary frequency, hematuria.   PHYSICAL EXAMINATION  ECOG PERFORMANCE STATUS: 1 - Symptomatic but completely ambulatory  Filed Vitals:   04/01/14 1529  BP: 123/57  Pulse: 64  Temp: 97.7 F (36.5 C)  Resp: 20     GENERAL:alert, no distress, well nourished, well developed, comfortable, cooperative and smiling SKIN: skin color, texture, turgor are normal, no rashes or significant lesions HEAD: Normocephalic, No masses, lesions, tenderness or abnormalities EYES: normal, PERRLA, EOMI, Conjunctiva are pink and non-injected EARS: External ears normal OROPHARYNX:mucous membranes are moist  NECK: supple, trachea midline LYMPH:  not examined BREAST:not examined LUNGS: not examined HEART: not examined ABDOMEN:not examined BACK: Back symmetric, no curvature. EXTREMITIES:no joint deformities, effusion, or inflammation, no cyanosis  NEURO: alert & oriented x 3 with fluent speech, no focal motor/sensory deficits   LABORATORY DATA: CBC    Component Value Date/Time   WBC 3.1* 04/01/2014 1555   RBC 2.63* 04/01/2014 1555   RBC 3.20* 03/25/2014 0852   HGB 8.2* 04/01/2014 1555   HCT 24.0* 04/01/2014 1555   PLT 98* 04/01/2014 1555   MCV 91.3 04/01/2014 1555   MCH 31.2 04/01/2014 1555  MCHC 34.2 04/01/2014 1555   RDW 16.0* 04/01/2014 1555   LYMPHSABS 1.6 03/25/2014 0512   MONOABS 0.1 03/25/2014 0512   EOSABS 0.2 03/25/2014 0512   BASOSABS 0.0 03/25/2014 0512     ASSESSMENT:  1. 5Q- MDS, Revlimid on hold due to progressive renal failure. 2. Renal failure 3. Chronic renal disease, grade 3B 4. Vitamin B12 deficiency, intrinsic factor antibody negative (03/26/2014).  Patient Active Problem List   Diagnosis Date Noted  . Chest pain 03/23/2014  . CHF (congestive heart failure) 02/12/2014  . Exertional dyspnea 02/12/2014  . Chest pain of uncertain etiology 19/75/8832  . Diabetes 01/01/2014  . Chronic kidney disease (CKD) stage G3b/A2, moderately decreased glomerular filtration rate (GFR) between 30-44 mL/min/1.73 square meter and albuminuria creatinine ratio between 30-299 mg/g 12/14/2013  . NSTEMI (non-ST elevated myocardial infarction) 11/28/2013  . HCAP (healthcare-associated pneumonia) 11/27/2013  . Community  acquired pneumonia 11/27/2013  . Acute on chronic systolic heart failure 54/98/2641  . Other pancytopenia 01/08/2013  . Non-ST elevation MI (NSTEMI) 01/06/2013  . High output heart failure 10/25/2012  . Myelodysplastic syndrome with 5 q minus 10/17/2012  . History of colonic polyps 02/28/2012  . Anemia 01/10/2012  . Angina effort 01/06/2012  . AVM (arteriovenous malformation) of colon without hemorrhage 01/04/2012  . GI bleeding 10/09/2011  . Long term current use of anticoagulant 12/22/2010  . CAROTID ARTERY STENOSIS 10/16/2010  . DYSLIPIDEMIA 04/11/2009  . CAD 04/11/2009  . Atrial fibrillation 04/11/2009  . PVD 04/11/2009  . OSTEOARTHRITIS 04/11/2009     PLAN:  1. I personally reviewed and went over laboratory results with the patient.  The results are noted within this dictation. 2. I personally reviewed and went over radiographic studies with the patient.  The results are noted within this dictation.   3. Discontinue Procrit supportive therapy due to EPO level being > 500. 4. Recommend B12 supplement OTC 5. Labs every week: CBC 6. Labs in 4 weeks: CMET 7. Hold Revlimid 8. Transfuse 2 units of PRBCs tomorrow for a Hgb below 9 g/dL. 9. Return in 4 weeks for follow-up   THERAPY PLAN:  Due to his renal function, we cannot administer Revlimid.  Therefore, we will monitor his blood counts and support his anemia as needed.   All questions were answered. The patient knows to call the clinic with any problems, questions or concerns. We can certainly see the patient much sooner if necessary.  Patient and plan discussed with Dr. Farrel Gobble and he is in agreement with the aforementioned.   [name] 04/01/2014

## 2014-04-01 NOTE — Addendum Note (Signed)
Addended by: Mellissa Kohut on: 04/01/2014 06:04 PM   Modules accepted: Orders

## 2014-04-01 NOTE — Progress Notes (Signed)
[facility] Program Physical Impairment and Functional Assessment Screening Tool  1. Are you having any pain, including headaches, joint pain, or muscle pain (upper body = OT; lower body = PT)?   [  x  ]  No   [    ]  Yes, but I hand this before my cancer diagnosis.   [    ]  Yes, this started after my diagnosis and is still a problem.   [    ]  Yes, this is new since my last visit. 2. Do your hands and/or feet feel numb or tingle (PT)?   [    ]  No   [    ]  Yes, but I hand this before my cancer diagnosis.   [   x ]  Yes, this started after my diagnosis and is still a problem.   [    ]  Yes, this is new since my last visit. 3. Does any part of your body feel swollen or larger than usual (upper body = OT; lower body = PT)?   [  x  ]  No   [    ]  Yes, but I hand this before my cancer diagnosis.   [    ]  Yes, this started after my diagnosis and is still a problem.   [    ]  Yes, this is new since my last visit. 4. Are you so tired that you cannot do the things you want or need to do (PT or OT)?   [    ]  No   [   x ]  Yes, but I hand this before my cancer diagnosis.   [    ]  Yes, this started after my diagnosis and is still a problem.   [    ]  Yes, this is new since my last visit. 5. Are you feeling weak or are you having trouble moving any part of your body (PT/OT)?   [    ]  No   [    ]  Yes, but I hand this before my cancer diagnosis.   [ x   ]  Yes, this started after my diagnosis and is still a problem.   [    ]  Yes, this is new since my last visit. 6. Are you having trouble concentrating, thinking, or remembering things (OT/ST)?   [    ]  No   [    ]  Yes, but I hand this before my cancer diagnosis.   [    ]  Yes, this started after my diagnosis and is still a problem.   [    ]  Yes, this is new since my last visit. 7. Are you having trouble moving around or feel like you might trip or fall (PT)?   [ x   ]  No   [    ]  Yes, but I hand this before my cancer diagnosis.   [    ]   Yes, this started after my diagnosis and is still a problem.   [    ]  Yes, this is new since my last visit. 8. Are you having trouble swallowing (ST)?   [    ]  No   [    ]  Yes, but I hand this before my cancer diagnosis.   [    ]  Yes, this started after  my diagnosis and is still a problem.   [    ]  Yes, this is new since my last visit. 9. Are you having trouble speaking (ST)?   [  x  ]  No   [    ]  Yes, but I hand this before my cancer diagnosis.   [    ]  Yes, this started after my diagnosis and is still a problem.   [    ]  Yes, this is new since my last visit. 10. Are you having trouble with going or getting to the bathroom (OT)?   [  x  ]  No   [    ]  Yes, but I hand this before my cancer diagnosis.   [    ]  Yes, this started after my diagnosis and is still a problem.   [    ]  Yes, this is new since my last visit. 11. Are you having trouble with your sexual function (OT)?   [   x ]  No   [    ]  Yes, but I hand this before my cancer diagnosis.   [    ]  Yes, this started after my diagnosis and is still a problem.   [    ]  Yes, this is new since my last visit. 12. Are you having trouble lifting things, even just your arms (OT/PT)?   [   x ]  No   [    ]  Yes, but I hand this before my cancer diagnosis.   [    ]  Yes, this started after my diagnosis and is still a problem.   [    ]  Yes, this is new since my last visit. 49. Are you having trouble taking care of yourself as in dressing or bathing (OT)?   [    ]  No   [  x  ]  Yes, but I hand this before my cancer diagnosis.   [    ]  Yes, this started after my diagnosis and is still a problem.   [    ]  Yes, this is new since my last visit. 14. Are you having trouble with daily tasks like chores or shopping (OT)?   [    ]  No   [  x  ]  Yes, but I hand this before my cancer diagnosis.   [    ]  Yes, this started after my diagnosis and is still a problem.   [    ]  Yes, this is new since my last visit. 15. Are you  having trouble driving (OT)?   [x    ]  No   [    ]  Yes, but I hand this before my cancer diagnosis.   [    ]  Yes, this started after my diagnosis and is still a problem.   [    ]  Yes, this is new since my last visit. 4. Are you having trouble returning to work or completing your tasks at work (OT)?   [  x  ]  No   [    ]  Yes, but I hand this before my cancer diagnosis.   [    ]  Yes, this started after my diagnosis and is still a problem.   [    ]  Yes, this is new  since my last visit.  Other concerns:    Legend: OT = Occupational Therapy PT = Physical Therapy ST = Speech Therapy

## 2014-04-02 ENCOUNTER — Encounter (HOSPITAL_BASED_OUTPATIENT_CLINIC_OR_DEPARTMENT_OTHER): Payer: Medicare Other

## 2014-04-02 VITALS — BP 138/51 | HR 52 | Temp 97.5°F | Resp 20

## 2014-04-02 DIAGNOSIS — D46C Myelodysplastic syndrome with isolated del(5q) chromosomal abnormality: Secondary | ICD-10-CM | POA: Diagnosis not present

## 2014-04-02 MED ORDER — ACETAMINOPHEN 325 MG PO TABS
650.0000 mg | ORAL_TABLET | Freq: Once | ORAL | Status: AC
  Administered 2014-04-02: 650 mg via ORAL
  Filled 2014-04-02: qty 2

## 2014-04-02 MED ORDER — FUROSEMIDE 10 MG/ML IJ SOLN
20.0000 mg | Freq: Once | INTRAMUSCULAR | Status: AC
  Administered 2014-04-02: 20 mg via INTRAVENOUS
  Filled 2014-04-02: qty 2

## 2014-04-02 MED ORDER — DIPHENHYDRAMINE HCL 25 MG PO CAPS
25.0000 mg | ORAL_CAPSULE | Freq: Once | ORAL | Status: AC
  Administered 2014-04-02: 25 mg via ORAL
  Filled 2014-04-02: qty 1

## 2014-04-02 NOTE — Progress Notes (Signed)
Tolerated blood without any difficulty. Lasix 20mg  IV given at end of blood transfusion.

## 2014-04-02 NOTE — Patient Instructions (Signed)
[facility] Discharge Instructions  RECOMMENDATIONS MADE BY THE CONSULTANT AND ANY TEST RESULTS WILL BE SENT TO YOUR REFERRING PHYSICIAN.  EXAM FINDINGS BY THE PHYSICIAN TODAY AND SIGNS OR SYMPTOMS TO REPORT TO CLINIC OR PRIMARY PHYSICIAN:  Today you received 2 units of PRBCs irradiated. Following the 2 units of blood you received Lasix 20mg  IV  Thank you for choosing [facility] to provide your oncology and hematology care.  To afford each patient quality time with our providers, please arrive at least 15 minutes before your scheduled appointment time.  With your help, our goal is to use those 15 minutes to complete the necessary work-up to ensure our physicians have the information they need to help with your evaluation and healthcare recommendations.    Effective January 1st, 2014, we ask that you re-schedule your appointment with our physicians should you arrive 10 or more minutes late for your appointment.  We strive to give you quality time with our providers, and arriving late affects you and other patients whose appointments are after yours.    Again, thank you for choosing [facility].  Our hope is that these requests will decrease the amount of time that you wait before being seen by our physicians.       _____________________________________________________________  Should you have questions after your visit to [facility], please contact our office at [phone] between the hours of 8:30 a.m. and 5:00 p.m.  Voicemails left after 4:30 p.m. will not be returned until the following business day.  For prescription refill requests, have your pharmacy contact our office with your prescription refill request.

## 2014-04-03 LAB — TYPE AND SCREEN
ABO/RH(D): A NEG
Antibody Screen: NEGATIVE
UNIT DIVISION: 0
Unit division: 0

## 2014-04-08 ENCOUNTER — Encounter (HOSPITAL_BASED_OUTPATIENT_CLINIC_OR_DEPARTMENT_OTHER): Payer: Medicare Other

## 2014-04-08 ENCOUNTER — Ambulatory Visit (HOSPITAL_COMMUNITY): Payer: Medicare Other

## 2014-04-08 DIAGNOSIS — D46C Myelodysplastic syndrome with isolated del(5q) chromosomal abnormality: Secondary | ICD-10-CM

## 2014-04-08 LAB — CBC
HCT: 27.3 % — ABNORMAL LOW (ref 39.0–52.0)
Hemoglobin: 9.5 g/dL — ABNORMAL LOW (ref 13.0–17.0)
MCH: 31.3 pg (ref 26.0–34.0)
MCHC: 34.8 g/dL (ref 30.0–36.0)
MCV: 89.8 fL (ref 78.0–100.0)
RBC: 3.04 MIL/uL — ABNORMAL LOW (ref 4.22–5.81)
RDW: 15.2 % (ref 11.5–15.5)
WBC: 3.7 10*3/uL — ABNORMAL LOW (ref 4.0–10.5)

## 2014-04-08 NOTE — Progress Notes (Signed)
Labs drawn for cbc

## 2014-04-15 ENCOUNTER — Encounter (HOSPITAL_BASED_OUTPATIENT_CLINIC_OR_DEPARTMENT_OTHER): Payer: Medicare Other

## 2014-04-15 ENCOUNTER — Other Ambulatory Visit (HOSPITAL_COMMUNITY): Payer: Self-pay | Admitting: Oncology

## 2014-04-15 DIAGNOSIS — D46C Myelodysplastic syndrome with isolated del(5q) chromosomal abnormality: Secondary | ICD-10-CM

## 2014-04-15 LAB — CBC
HCT: 25.3 % — ABNORMAL LOW (ref 39.0–52.0)
HEMOGLOBIN: 8.7 g/dL — AB (ref 13.0–17.0)
MCH: 30.9 pg (ref 26.0–34.0)
MCHC: 34.4 g/dL (ref 30.0–36.0)
MCV: 89.7 fL (ref 78.0–100.0)
PLATELETS: 83 10*3/uL — AB (ref 150–400)
RBC: 2.82 MIL/uL — ABNORMAL LOW (ref 4.22–5.81)
RDW: 15.8 % — ABNORMAL HIGH (ref 11.5–15.5)
WBC: 4.4 10*3/uL (ref 4.0–10.5)

## 2014-04-15 NOTE — Progress Notes (Signed)
LABS DRAWN FOR CBC.

## 2014-04-16 ENCOUNTER — Encounter (HOSPITAL_BASED_OUTPATIENT_CLINIC_OR_DEPARTMENT_OTHER): Payer: Medicare Other

## 2014-04-16 DIAGNOSIS — D46C Myelodysplastic syndrome with isolated del(5q) chromosomal abnormality: Secondary | ICD-10-CM | POA: Diagnosis not present

## 2014-04-16 NOTE — Progress Notes (Signed)
LABS DRAWN FOR TYSCR

## 2014-04-17 ENCOUNTER — Encounter (HOSPITAL_BASED_OUTPATIENT_CLINIC_OR_DEPARTMENT_OTHER): Payer: Medicare Other

## 2014-04-17 VITALS — BP 137/50 | HR 54 | Temp 97.5°F | Resp 18

## 2014-04-17 DIAGNOSIS — D46C Myelodysplastic syndrome with isolated del(5q) chromosomal abnormality: Secondary | ICD-10-CM | POA: Diagnosis not present

## 2014-04-17 MED ORDER — DIPHENHYDRAMINE HCL 25 MG PO CAPS
25.0000 mg | ORAL_CAPSULE | Freq: Once | ORAL | Status: AC
  Administered 2014-04-17: 25 mg via ORAL

## 2014-04-17 MED ORDER — DIPHENHYDRAMINE HCL 25 MG PO CAPS
ORAL_CAPSULE | ORAL | Status: AC
  Filled 2014-04-17: qty 1

## 2014-04-17 MED ORDER — SODIUM CHLORIDE 0.9 % IJ SOLN
10.0000 mL | INTRAMUSCULAR | Status: AC | PRN
  Administered 2014-04-17: 10 mL

## 2014-04-17 MED ORDER — SODIUM CHLORIDE 0.9 % IV SOLN
250.0000 mL | Freq: Once | INTRAVENOUS | Status: AC
  Administered 2014-04-17: 250 mL via INTRAVENOUS

## 2014-04-17 MED ORDER — ACETAMINOPHEN 325 MG PO TABS
ORAL_TABLET | ORAL | Status: AC
  Filled 2014-04-17: qty 2

## 2014-04-17 MED ORDER — ACETAMINOPHEN 325 MG PO TABS
650.0000 mg | ORAL_TABLET | Freq: Once | ORAL | Status: AC
  Administered 2014-04-17: 650 mg via ORAL

## 2014-04-17 NOTE — Progress Notes (Signed)
Tolerated well

## 2014-04-18 ENCOUNTER — Other Ambulatory Visit (HOSPITAL_COMMUNITY): Payer: Medicare Other

## 2014-04-18 ENCOUNTER — Ambulatory Visit (HOSPITAL_COMMUNITY): Payer: Medicare Other

## 2014-04-18 LAB — TYPE AND SCREEN
ABO/RH(D): A NEG
Antibody Screen: NEGATIVE
UNIT DIVISION: 0
Unit division: 0

## 2014-04-22 ENCOUNTER — Encounter (HOSPITAL_BASED_OUTPATIENT_CLINIC_OR_DEPARTMENT_OTHER): Payer: Medicare Other

## 2014-04-22 ENCOUNTER — Other Ambulatory Visit (HOSPITAL_COMMUNITY): Payer: Self-pay | Admitting: Oncology

## 2014-04-22 DIAGNOSIS — D46C Myelodysplastic syndrome with isolated del(5q) chromosomal abnormality: Secondary | ICD-10-CM

## 2014-04-22 LAB — CBC
HCT: 26.3 % — ABNORMAL LOW (ref 39.0–52.0)
Hemoglobin: 8.9 g/dL — ABNORMAL LOW (ref 13.0–17.0)
MCH: 30.2 pg (ref 26.0–34.0)
MCHC: 33.8 g/dL (ref 30.0–36.0)
MCV: 89.2 fL (ref 78.0–100.0)
PLATELETS: 84 10*3/uL — AB (ref 150–400)
RBC: 2.95 MIL/uL — AB (ref 4.22–5.81)
RDW: 16.5 % — ABNORMAL HIGH (ref 11.5–15.5)
WBC: 4.5 10*3/uL (ref 4.0–10.5)

## 2014-04-22 NOTE — Progress Notes (Signed)
LABS DRAWN FOR CBC,TYSCR

## 2014-04-23 ENCOUNTER — Encounter (HOSPITAL_BASED_OUTPATIENT_CLINIC_OR_DEPARTMENT_OTHER): Payer: Medicare Other

## 2014-04-23 DIAGNOSIS — D46C Myelodysplastic syndrome with isolated del(5q) chromosomal abnormality: Secondary | ICD-10-CM

## 2014-04-23 LAB — PREPARE RBC (CROSSMATCH)

## 2014-04-23 NOTE — Progress Notes (Signed)
LABS DRAWN FOR TYSCR

## 2014-04-24 ENCOUNTER — Encounter (HOSPITAL_BASED_OUTPATIENT_CLINIC_OR_DEPARTMENT_OTHER): Payer: Medicare Other

## 2014-04-24 ENCOUNTER — Other Ambulatory Visit (HOSPITAL_BASED_OUTPATIENT_CLINIC_OR_DEPARTMENT_OTHER): Payer: Self-pay | Admitting: Internal Medicine

## 2014-04-24 VITALS — BP 132/45 | HR 58 | Temp 97.5°F | Resp 18

## 2014-04-24 DIAGNOSIS — D46C Myelodysplastic syndrome with isolated del(5q) chromosomal abnormality: Secondary | ICD-10-CM

## 2014-04-24 MED ORDER — DIPHENHYDRAMINE HCL 25 MG PO CAPS
ORAL_CAPSULE | ORAL | Status: AC
  Filled 2014-04-24: qty 1

## 2014-04-24 MED ORDER — ACETAMINOPHEN 325 MG PO TABS
ORAL_TABLET | ORAL | Status: AC
  Filled 2014-04-24: qty 2

## 2014-04-24 MED ORDER — ACETAMINOPHEN 325 MG PO TABS
650.0000 mg | ORAL_TABLET | Freq: Once | ORAL | Status: AC
  Administered 2014-04-24: 650 mg via ORAL

## 2014-04-24 MED ORDER — SODIUM CHLORIDE 0.9 % IV SOLN
250.0000 mL | Freq: Once | INTRAVENOUS | Status: AC
  Administered 2014-04-24: 11:00:00 via INTRAVENOUS

## 2014-04-24 MED ORDER — DIPHENHYDRAMINE HCL 25 MG PO CAPS
25.0000 mg | ORAL_CAPSULE | Freq: Once | ORAL | Status: AC
  Administered 2014-04-24: 25 mg via ORAL

## 2014-04-24 MED ORDER — SODIUM CHLORIDE 0.9 % IJ SOLN: 10.0000 mL | INTRAMUSCULAR | Status: DC | PRN

## 2014-04-24 NOTE — Progress Notes (Signed)
Patient reported tripping and falling while getting the newspaper on Sunday of this week. Denies any loc, has dressing in place to his right upper arm.  Patient requested RN to change his dressing, old dressing removed and re-dressed with telfa and paper tape, small amount of drainage noted to old dressing, foul odor noted approx. Quarter sized area with skin torn.  Patient given education on wound care. PA Kefalas notified.  Advised to call the clinic or his PMD if he has further questions or concerns.  The patient tolerated 2 united of RBC's without difficulty.  Assisted to car in wheelchair.

## 2014-04-25 LAB — TYPE AND SCREEN
ABO/RH(D): A NEG
ANTIBODY SCREEN: NEGATIVE
UNIT DIVISION: 0
Unit division: 0

## 2014-04-27 NOTE — Progress Notes (Signed)
Caleb Bogus, MD [place] [address]  Myelodysplastic syndrome with 5 q minus - Plan: Comprehensive metabolic panel, CBC, CANCELED: CBC with Differential  Other vitamin B12 deficiency anemia - Plan: Vitamin B12  CURRENT THERAPY: Observation with support of Hgb via blood transfusions to maintain a Hgb of 9 g/dL or greater due to cardiac history. Last transfusion was on 04/23/2014. Revlimid on hold due to worsening renal function  INTERVAL HISTORY: [name] 80 y.o. male returns for  regular  visit for followup of 5Q- MDS, with Revlimid on hold due to progressively worse renal function on 03/07/2014.    Myelodysplastic syndrome with 5 q minus   09/05/2012 Bone Marrow Biopsy 5 q- MDS   09/19/2012 - 12/15/2012 Chemotherapy Approximate start date of Revlimid 5 mg 21 days on and 7 days off   12/16/2012 - 07/18/2013 Chemotherapy Dose increased to 10 mg Revlimid 21 days on and 7 days off   07/19/2013 - 03/07/2014 Chemotherapy Dose increased to 10 mg daily and then altered a number of times due to hospitalizations.   03/07/2014 Adverse Reaction Worsening renal function   03/07/2014 Treatment Plan Change Revlimid discontinued    I personally reviewed and went over laboratory results with the patient.  The results are noted within this dictation.  He reports that he fell on Sunday without any injuries.  He is using a rolling walker today to maintain his balance.  He was referred to the [facility] program for rehabilitation and he is really excited about starting that.  He starts on 8/13.  Hematologically, he denies any complaints and ROS questioning is negative.  Past Medical History  Diagnosis Date  . Essential hypertension, benign   . COPD (chronic obstructive pulmonary disease)   . Coronary atherosclerosis of native coronary artery     Multivessel status post CABG 1996  . Arthritis   . Atrial fibrillation     Coumadin stopped 12/2011 due to anemia  .  Dysphagia   . Anemia     Requiring blood transfusions  . Carotid stenosis     Dr Scot Dock   . Diabetes mellitus, type 2   . Mixed hyperlipidemia     Statin intolerant  . Hematuria     Felt related to Foley insertion  . Cardiomyopathy     LVEF 45-50%  . Melena     EGD & Colonoscopy 09/2011 revealed gastritis, erosions, scars, diverticula, 8 colon polyps (largest 1.6cm, not removed 2/2 anticoagulation), small AVM in transverse colon  . History of tobacco abuse     Quit 1992  . Anxiety   . GERD (gastroesophageal reflux disease)   . Myocardial infarction   . Thrombocytopenia   . Myelodysplastic syndrome with 5 q minus 10/17/2012    On Revlimid 5 mg daily 21 days on and 7 days off  . Aortic stenosis     Mild  . CKD (chronic kidney disease) stage 3, GFR 30-59 ml/min   . Pneumonia 11/2013    history of    has DYSLIPIDEMIA; CAD; Atrial fibrillation; PVD; OSTEOARTHRITIS; CAROTID ARTERY STENOSIS; Long term current use of anticoagulant; GI bleeding; AVM (arteriovenous malformation) of colon without hemorrhage; Angina effort; Anemia; History of colonic polyps; Myelodysplastic syndrome with 5 q minus; High output heart failure; Non-ST elevation MI (NSTEMI); Acute on chronic systolic heart failure; Other pancytopenia; HCAP (healthcare-associated pneumonia); Community acquired pneumonia; NSTEMI (non-ST elevated myocardial infarction); Chronic kidney disease (CKD) stage G3b/A2, moderately decreased glomerular filtration rate (GFR)  between 30-44 mL/min/1.73 square meter and albuminuria creatinine ratio between 30-299 mg/g; Diabetes; CHF (congestive heart failure); Exertional dyspnea; Chest pain of uncertain etiology; and Chest pain on his problem list.     is allergic to statins and tape.  [name] does not currently have medications on file.  Past Surgical History  Procedure Laterality Date  . Knee surgery  1960    Right knee cartilage  . Rotator cuff repair  1990    right   . Inguinal hernia  repair  2000 and 2010    left  . Cholecystectomy  05/2010    Lap chole with biologic mesh repair/reinforcement by  Dr Rise Patience  . Back surgery  02/2006    ruptured disk,  post op diskitis infection requiring prolonged hospital stay and  surgical I & D  . Tonsillectomy    . Esophagogastroduodenoscopy  10/08/2011    Procedure: ESOPHAGOGASTRODUODENOSCOPY (EGD);  Surgeon: [name], MD;  [facility];  Service: Endoscopy;  Laterality: N/A;  . Colonoscopy  10/08/2011    Procedure: COLONOSCOPY;  Surgeon: [name], MD;  [facility];  Service: Endoscopy;  Laterality: N/A;  . Cataract extraction, bilateral    . Peg placement  07/2006    placed due to prolonged infection, poor po intake, malnutrition during several week hospital stay resulting from ruptured disc that became infected following surgery  . Colonoscopy  01/14/2012    Procedure: COLONOSCOPY;  Surgeon: [name], MD;  [facility]: [facility];  Service: Endoscopy;  Laterality: N/A;  945  . Coronary artery bypass graft  1996    CABG x 4  . Ventral hernia repair    . Colonoscopy  04/20/2012    Procedure: COLONOSCOPY;  Surgeon: [name], MD;  [facility]: [facility];  Service: Endoscopy;  Laterality: N/A;  1030  . Pr vein bypass graft,aorto-fem-pop  1996  . Spine surgery    . Joint replacement  1960    Right Knee  . Lymph node biopsy  08/15/2012    Procedure: LYMPH NODE BIOPSY;  Surgeon: Scherry Ran, MD;  Location: [facility];  Service: General;  Laterality: Left;  Cervical Lymph Node Bx in Minor Room  . Bone marrow biopsy    . Bone marrow aspiration      Denies any headaches, dizziness, double vision, fevers, chills, night sweats, nausea, vomiting, diarrhea, constipation, chest pain, heart palpitations, shortness of breath, blood in stool, black tarry stool, urinary pain, urinary burning, urinary frequency, hematuria.   PHYSICAL EXAMINATION  ECOG PERFORMANCE STATUS: 2 - Symptomatic, <50%  confined to bed  Filed Vitals:   04/29/14 1000  BP: 147/56  Pulse: 59  Temp: 97.6 F (36.4 C)  Resp: 20    GENERAL:alert, no distress, well nourished, well developed, comfortable, cooperative and smiling SKIN: skin color, texture, turgor are normal, no rashes or significant lesions HEAD: Normocephalic, No masses, lesions, tenderness or abnormalities EYES: normal, PERRLA, EOMI, Conjunctiva are pink and non-injected EARS: External ears normal OROPHARYNX:mucous membranes are moist  NECK: supple, trachea midline LYMPH:  not examined BREAST:not examined LUNGS: not examined HEART: not examined ABDOMEN:normal bowel sounds BACK: Back symmetric, no curvature. EXTREMITIES:less then 2 second capillary refill, no edema, no cyanosis, positive findings:  Ecchymoses on UE  NEURO: alert & oriented x 3 with fluent speech, no focal motor/sensory deficits    LABORATORY DATA: CBC    Component Value Date/Time   WBC 3.8* 04/29/2014 0934   RBC 3.25* 04/29/2014  0934   RBC 3.20* 03/25/2014 0852   HGB 9.7* 04/29/2014 0934   HCT 28.7* 04/29/2014 0934   PLT 97* 04/29/2014 0934   MCV 88.3 04/29/2014 0934   MCH 29.8 04/29/2014 0934   MCHC 33.8 04/29/2014 0934   RDW 15.8* 04/29/2014 0934   LYMPHSABS 1.6 03/25/2014 0512   MONOABS 0.1 03/25/2014 0512   EOSABS 0.2 03/25/2014 0512   BASOSABS 0.0 03/25/2014 0512     ASSESSMENT:  1. 5Q- MDS, Revlimid on hold due to progressive renal failure and co-morbidities.  2. Renal failure  3. Chronic renal disease, grade 3B  4. Vitamin B12 deficiency, intrinsic factor antibody negative (03/26/2014).  Patient Active Problem List   Diagnosis Date Noted  . Chest pain 03/23/2014  . CHF (congestive heart failure) 02/12/2014  . Exertional dyspnea 02/12/2014  . Chest pain of uncertain etiology 17/49/4496  . Diabetes 01/01/2014  . Chronic kidney disease (CKD) stage G3b/A2, moderately decreased glomerular filtration rate (GFR) between 30-44 mL/min/1.73 square meter and albuminuria  creatinine ratio between 30-299 mg/g 12/14/2013  . NSTEMI (non-ST elevated myocardial infarction) 11/28/2013  . HCAP (healthcare-associated pneumonia) 11/27/2013  . Community acquired pneumonia 11/27/2013  . Acute on chronic systolic heart failure 75/91/6384  . Other pancytopenia 01/08/2013  . Non-ST elevation MI (NSTEMI) 01/06/2013  . High output heart failure 10/25/2012  . Myelodysplastic syndrome with 5 q minus 10/17/2012  . History of colonic polyps 02/28/2012  . Anemia 01/10/2012  . Angina effort 01/06/2012  . AVM (arteriovenous malformation) of colon without hemorrhage 01/04/2012  . GI bleeding 10/09/2011  . Long term current use of anticoagulant 12/22/2010  . CAROTID ARTERY STENOSIS 10/16/2010  . DYSLIPIDEMIA 04/11/2009  . CAD 04/11/2009  . Atrial fibrillation 04/11/2009  . PVD 04/11/2009  . OSTEOARTHRITIS 04/11/2009      PLAN:  1. I personally reviewed and went over laboratory results with the patient.  The results are noted within this dictation. 2. Labs today: CBC diff, CMET, B12 3. Labs weekly: CBC diff 4. Will administer PRBCs to maintain a Hgb of 9 g/dL due to cardiac history 5. Labs in 4 weeks: CBC diff, CMET 6. Return in 4 weeks for follow-up   THERAPY PLAN:  We will continue to hold Revlimid due to renal function and maintain his Hgb at 9 g/dL or greater due to cardiac history with PRBCs.  He will likely succumb to one of his other co-morbidites and less likely from MDS.  As a result, we desire to treat him conservatively due to the risk associated with Revlimid.  All questions were answered. The patient knows to call the clinic with any problems, questions or concerns. We can certainly see the patient much sooner if necessary.  Patient and plan discussed with Dr. Farrel Gobble and he is in agreement with the aforementioned.   [name] 04/29/2014

## 2014-04-29 ENCOUNTER — Encounter (HOSPITAL_BASED_OUTPATIENT_CLINIC_OR_DEPARTMENT_OTHER): Payer: Medicare Other

## 2014-04-29 ENCOUNTER — Encounter (HOSPITAL_COMMUNITY): Payer: Medicare Other | Attending: Oncology | Admitting: Oncology

## 2014-04-29 ENCOUNTER — Ambulatory Visit (HOSPITAL_COMMUNITY): Payer: Medicare Other | Admitting: Physical Therapy

## 2014-04-29 ENCOUNTER — Encounter (HOSPITAL_COMMUNITY): Payer: Self-pay | Admitting: Oncology

## 2014-04-29 VITALS — BP 147/56 | HR 59 | Temp 97.6°F | Resp 20 | Wt 193.3 lb

## 2014-04-29 DIAGNOSIS — N189 Chronic kidney disease, unspecified: Secondary | ICD-10-CM

## 2014-04-29 DIAGNOSIS — D518 Other vitamin B12 deficiency anemias: Secondary | ICD-10-CM | POA: Diagnosis not present

## 2014-04-29 DIAGNOSIS — D46C Myelodysplastic syndrome with isolated del(5q) chromosomal abnormality: Secondary | ICD-10-CM

## 2014-04-29 LAB — CBC
HCT: 28.7 % — ABNORMAL LOW (ref 39.0–52.0)
HEMOGLOBIN: 9.7 g/dL — AB (ref 13.0–17.0)
MCH: 29.8 pg (ref 26.0–34.0)
MCHC: 33.8 g/dL (ref 30.0–36.0)
MCV: 88.3 fL (ref 78.0–100.0)
PLATELETS: 97 10*3/uL — AB (ref 150–400)
RBC: 3.25 MIL/uL — ABNORMAL LOW (ref 4.22–5.81)
RDW: 15.8 % — ABNORMAL HIGH (ref 11.5–15.5)
WBC: 3.8 10*3/uL — ABNORMAL LOW (ref 4.0–10.5)

## 2014-04-29 LAB — COMPREHENSIVE METABOLIC PANEL
ALBUMIN: 3.6 g/dL (ref 3.5–5.2)
ALK PHOS: 87 U/L (ref 39–117)
ALT: 8 U/L (ref 0–53)
AST: 13 U/L (ref 0–37)
Anion gap: 12 (ref 5–15)
BUN: 25 mg/dL — ABNORMAL HIGH (ref 6–23)
CALCIUM: 9.1 mg/dL (ref 8.4–10.5)
CO2: 23 mEq/L (ref 19–32)
Chloride: 107 mEq/L (ref 96–112)
Creatinine, Ser: 1.48 mg/dL — ABNORMAL HIGH (ref 0.50–1.35)
GFR calc Af Amer: 50 mL/min — ABNORMAL LOW (ref >90)
GFR calc non Af Amer: 43 mL/min — ABNORMAL LOW (ref >90)
Glucose, Bld: 161 mg/dL — ABNORMAL HIGH (ref 70–99)
POTASSIUM: 4.2 meq/L (ref 3.7–5.3)
Sodium: 142 mEq/L (ref 137–147)
TOTAL PROTEIN: 7.2 g/dL (ref 6.0–8.3)
Total Bilirubin: 0.5 mg/dL (ref 0.3–1.2)

## 2014-04-29 LAB — VITAMIN B12: Vitamin B-12: 256 pg/mL (ref 211–911)

## 2014-04-29 NOTE — Progress Notes (Signed)
LABS DRAWN FOR CBC,CMP,B12.

## 2014-04-29 NOTE — Patient Instructions (Signed)
[facility] Discharge Instructions  RECOMMENDATIONS MADE BY THE CONSULTANT AND ANY TEST RESULTS WILL BE SENT TO YOUR REFERRING PHYSICIAN.  EXAM FINDINGS BY THE PHYSICIAN TODAY AND SIGNS OR SYMPTOMS TO REPORT TO CLINIC OR PRIMARY PHYSICIAN: Exam and findings as discussed by Serita Butcher.  MEDICATIONS PRESCRIBED:  Continue all as prescribed.  INSTRUCTIONS/FOLLOW-UP: Return to clinic weekly for lab work. MD appointment again in 6 weeks. Report any issues/concerns to clinic as needed.  Thank you for choosing [facility] to provide your oncology and hematology care.  To afford each patient quality time with our providers, please arrive at least 15 minutes before your scheduled appointment time.  With your help, our goal is to use those 15 minutes to complete the necessary work-up to ensure our physicians have the information they need to help with your evaluation and healthcare recommendations.    Effective January 1st, 2014, we ask that you re-schedule your appointment with our physicians should you arrive 10 or more minutes late for your appointment.  We strive to give you quality time with our providers, and arriving late affects you and other patients whose appointments are after yours.    Again, thank you for choosing [facility].  Our hope is that these requests will decrease the amount of time that you wait before being seen by our physicians.       _____________________________________________________________  Should you have questions after your visit to [facility], please contact our office at [phone] between the hours of 8:30 a.m. and 4:30 p.m.  Voicemails left after 4:30 p.m. will not be returned until the following business day.  For prescription refill requests, have your pharmacy contact our office with your prescription refill request.    _______________________________________________________________  We hope  that we have given you very good care.  You may receive a patient satisfaction survey in the mail, please complete it and return it as soon as possible.  We value your feedback!  _______________________________________________________________  Have you asked about our STAR program?  STAR stands for Survivorship Training and Rehabilitation, and this is a nationally recognized cancer care program that focuses on survivorship and rehabilitation.  Cancer and cancer treatments may cause problems, such as, pain, making you feel tired and keeping you from doing the things that you need or want to do. Cancer rehabilitation can help. Our goal is to reduce these troubling effects and help you have the best quality of life possible.  You may receive a survey from a nurse that asks questions about your current state of health.  Based on the survey results, all eligible patients will be referred to the [facility] program for an evaluation so we can better serve you!  A frequently asked questions sheet is available upon request.

## 2014-05-02 ENCOUNTER — Ambulatory Visit (HOSPITAL_COMMUNITY): Payer: Medicare Other

## 2014-05-06 ENCOUNTER — Encounter (HOSPITAL_BASED_OUTPATIENT_CLINIC_OR_DEPARTMENT_OTHER): Payer: Medicare Other

## 2014-05-06 DIAGNOSIS — D46C Myelodysplastic syndrome with isolated del(5q) chromosomal abnormality: Secondary | ICD-10-CM

## 2014-05-06 DIAGNOSIS — D518 Other vitamin B12 deficiency anemias: Secondary | ICD-10-CM | POA: Diagnosis not present

## 2014-05-06 LAB — CBC
HCT: 27.4 % — ABNORMAL LOW (ref 39.0–52.0)
HEMOGLOBIN: 9.3 g/dL — AB (ref 13.0–17.0)
MCH: 29.7 pg (ref 26.0–34.0)
MCHC: 33.9 g/dL (ref 30.0–36.0)
MCV: 87.5 fL (ref 78.0–100.0)
Platelets: 104 10*3/uL — ABNORMAL LOW (ref 150–400)
RBC: 3.13 MIL/uL — AB (ref 4.22–5.81)
RDW: 16.3 % — ABNORMAL HIGH (ref 11.5–15.5)
WBC: 3.9 10*3/uL — ABNORMAL LOW (ref 4.0–10.5)

## 2014-05-06 NOTE — Progress Notes (Signed)
[name]'s reason for visit today are for labs as scheduled per MD orders.  Venipuncture performed with a 23 gauge butterfly needle to R Antecubital.  Derl Barrow Lagerquist tolerated venipuncture well and without incident; questions were answered and patient was discharged.

## 2014-05-09 ENCOUNTER — Ambulatory Visit (HOSPITAL_COMMUNITY)
Admission: RE | Admit: 2014-05-09 | Discharge: 2014-05-09 | Disposition: A | Discharge status: Home / Self Care | Payer: Medicare Other | Source: Ambulatory Visit | Attending: Oncology | Admitting: Oncology

## 2014-05-09 DIAGNOSIS — Z9181 History of falling: Secondary | ICD-10-CM | POA: Diagnosis not present

## 2014-05-09 DIAGNOSIS — E119 Type 2 diabetes mellitus without complications: Secondary | ICD-10-CM | POA: Diagnosis not present

## 2014-05-09 DIAGNOSIS — IMO0001 Reserved for inherently not codable concepts without codable children: Secondary | ICD-10-CM | POA: Diagnosis present

## 2014-05-09 DIAGNOSIS — R29898 Other symptoms and signs involving the musculoskeletal system: Secondary | ICD-10-CM

## 2014-05-09 DIAGNOSIS — R2689 Other abnormalities of gait and mobility: Secondary | ICD-10-CM

## 2014-05-09 DIAGNOSIS — M6281 Muscle weakness (generalized): Secondary | ICD-10-CM | POA: Diagnosis not present

## 2014-05-09 DIAGNOSIS — R262 Difficulty in walking, not elsewhere classified: Secondary | ICD-10-CM | POA: Diagnosis not present

## 2014-05-09 NOTE — Evaluation (Signed)
Physical Therapy Evaluation  Patient Details  Name: [name] MRN: [id number] Date of Birth: [birth year]  Today's Date: 05/09/2014 Time: 0930-1015 PT Time Calculation (min): 45 min Charge:  Evaluation.             Visit#: 1 of 12  Re-eval: 06/08/14 Assessment Diagnosis: difficulty walking  Prior Therapy: 2014 for back   Authorization: medicare      Past Medical History:  Past Medical History  Diagnosis Date  . Essential hypertension, benign   . COPD (chronic obstructive pulmonary disease)   . Coronary atherosclerosis of native coronary artery     Multivessel status post CABG 1996  . Arthritis   . Atrial fibrillation     Coumadin stopped 12/2011 due to anemia  . Dysphagia   . Anemia     Requiring blood transfusions  . Carotid stenosis     Dr Scot Dock   . Diabetes mellitus, type 2   . Mixed hyperlipidemia     Statin intolerant  . Hematuria     Felt related to Foley insertion  . Cardiomyopathy     LVEF 45-50%  . Melena     EGD & Colonoscopy 09/2011 revealed gastritis, erosions, scars, diverticula, 8 colon polyps (largest 1.6cm, not removed 2/2 anticoagulation), small AVM in transverse colon  . History of tobacco abuse     Quit 1992  . Anxiety   . GERD (gastroesophageal reflux disease)   . Myocardial infarction   . Thrombocytopenia   . Myelodysplastic syndrome with 5 q minus 10/17/2012    On Revlimid 5 mg daily 21 days on and 7 days off  . Aortic stenosis     Mild  . CKD (chronic kidney disease) stage 3, GFR 30-59 ml/min   . Pneumonia 11/2013    history of   Past Surgical History:  Past Surgical History  Procedure Laterality Date  . Knee surgery  1960    Right knee cartilage  . Rotator cuff repair  1990    right   . Inguinal hernia repair  2000 and 2010    left  . Cholecystectomy  05/2010    Lap chole with biologic mesh repair/reinforcement by  Dr Rise Patience  . Back surgery  02/2006    ruptured disk,  post op diskitis infection requiring prolonged  hospital stay and  surgical I & D  . Tonsillectomy    . Esophagogastroduodenoscopy  10/08/2011    Procedure: ESOPHAGOGASTRODUODENOSCOPY (EGD);  Surgeon: [name], MD;  [facility];  Service: Endoscopy;  Laterality: N/A;  . Colonoscopy  10/08/2011    Procedure: COLONOSCOPY;  Surgeon: [name], MD;  [facility];  Service: Endoscopy;  Laterality: N/A;  . Cataract extraction, bilateral    . Peg placement  07/2006    placed due to prolonged infection, poor po intake, malnutrition during several week hospital stay resulting from ruptured disc that became infected following surgery  . Colonoscopy  01/14/2012    Procedure: COLONOSCOPY;  Surgeon: [name], MD;  [facility]: [facility];  Service: Endoscopy;  Laterality: N/A;  945  . Coronary artery bypass graft  1996    CABG x 4  . Ventral hernia repair    . Colonoscopy  04/20/2012    Procedure: COLONOSCOPY;  Surgeon: [name], MD;  [facility];  Service: Endoscopy;  Laterality: N/A;  1030  . Pr vein bypass graft,aorto-fem-pop  1996  . Spine surgery    . Joint replacement  1960    Right Knee  . Lymph node biopsy  08/15/2012    Procedure: LYMPH NODE BIOPSY;  Surgeon: Scherry Ran, MD;  Location: [facility];  Service: General;  Laterality: Left;  Cervical Lymph Node Bx in Minor Room  . Bone marrow biopsy    . Bone marrow aspiration      Subjective Symptoms/Limitations Symptoms:  Mr. Dutton states that he started falling about three months ago.  He has fallen several times after this and has progressed from walking without a cane to walking on a walker.    The last time he fell was about a month ago.   Pertinent History: back surgery 2007 the patient got an infection and ended up in the hospital for 72 days and the patient did not walk for almost a year.  ; myelodysplastic syndrom (bone marrow does not make blood). Pt started Chemo a year ago last treatement was July.  Pt needs blood  transfusions 2 pints a week for the past 7 months. How long can you sit comfortably?: able to sit without problem  How long can you stand comfortably?: less than five minutes.  How long can you walk comfortably?: less than five minutes pt will have increased pain  Patient Stated Goals: to be able to walk with a cane.   Pain Assessment Currently in Pain?: Yes (will get as high as an 8/10) Pain Score: 0-No pain Pain Relieving Factors: sitting will decrease pain almost immediately.    Precautions/Restrictions   falls  Balance Screening Balance Screen Has the patient fallen in the past 6 months: Yes How many times?: 4 Has the patient had a decrease in activity level because of a fear of falling? : Yes Is the patient reluctant to leave their home because of a fear of falling? : No  Prior Functioning  Prior Function Vocation: Retired Leisure: Hobbies-no  Cognition/Observation Observation/Other Assessments Observations: pt has rounded shoulders, stands with forward flexed positon of  30 degrees   Sensation/Coordination/Flexibility/Functional Tests Flexibility 90/90: Positive Functional Tests Functional Tests: [id number]-F-117.6 Functional Tests: VAS fatigue 6.6; VAS pain 4.6, VAS distress 6.3   Assessment RLE Strength Right Hip Flexion: 3/5 Right Hip Extension: 2+/5 Right Hip ABduction: 3/5 Right Hip ADduction: 4/5 Right Knee Flexion: 3/5 Right Knee Extension: 3+/5 Right Ankle Dorsiflexion: 3+/5 Right Ankle Plantar Flexion: 3-/5 LLE Strength Left Hip Flexion: 3/5 Left Hip Extension: 3-/5 Left Hip ABduction: 3/5 Left Hip ADduction: 4/5 Left Knee Flexion: 3/5 Left Knee Extension: 5/5 Left Ankle Dorsiflexion: 3+/5 Left Ankle Plantar Flexion: 3-/5  Exercise/Treatments Mobility/Balance  Ambulation/Gait Ambulation/Gait: Yes Ambulation/Gait Assistance: 6: Modified independent (Device/Increase time) Gait Pattern: Step-to pattern;Decreased step length - right;Decreased step  length - left;Right foot flat;Left foot flat Static Standing Balance Single Leg Stance - Right Leg: 3 Single Leg Stance - Left Leg: 1 Timed Up and Go Test TUG: Normal TUG Normal TUG (seconds): 24     Standing Heel Raises: 10 reps SLS: 3x Seated Long Arc Quad: 10 reps Supine Quad Sets: 10 reps Straight Leg Raises: 10 reps Sidelying Hip ABduction: 10 reps Prone  Hamstring Curl: 5 sets Hip Extension: 10 reps     Physical Therapy Assessment and Plan PT Assessment and Plan Clinical Impression Statement: Pt is an 80 yo male who has a complicated history.  He Had a back surgery in 2007 that became infected resulting in a 72 day hospitalizatin and chronic numbness in his Lt LE.  He was diagnosed with myeldyoplastic syndrome and is having  increased difficulty walking as well as numbness in his feet.  He has been referred to therapy.  Examinatio demonstrates significant weakness in B LE as well as decreased balance.  Pt will benefit from skilled therapy to increase his functining ability and improve his quality of life.  Pt will benefit from skilled therapeutic intervention in order to improve on the following deficits: Decreased activity tolerance;Decreased balance;Difficulty walking;Pain;Decreased strength Rehab Potential: Good PT Frequency: Min 3X/week PT Duration: 4 weeks PT Treatment/Interventions: Gait training;Patient/family education;Therapeutic activities;Therapeutic exercise;Balance training PT Plan: Concentrate on improving standing posture as well as core and LE strengthening.  As patient progresses begin gt training with a cane.     Goals Home Exercise Program Pt/caregiver will Perform Home Exercise Program: For increased strengthening PT Short Term Goals Time to Complete Short Term Goals: 2 weeks PT Short Term Goal 1: Pt to be able to stand for 10 minutes without increased pain to be able to make a sandwich for a meal PT Short Term Goal 2: Pt to be able to walk for 15  minutes for better health habits.  PT Short Term Goal 3: Pt to walk with 20 degree forward flexion  PT Long Term Goals Time to Complete Long Term Goals: 4 weeks PT Long Term Goal 1: I in advance HEP PT Long Term Goal 2: Pt to be able to stand for 20 minutes to socialize Long Term Goal 3: Pt to be comfortable walking inside with a cane Long Term Goal 4: Patient to be able to walk with his walker for 30 minutes at a time PT Long Term Goal 5: forward bent posture to be decreased to only 10 degree forward bend  Additional PT Long Term Goals?: Yes PT Long Term Goal 6: Pain at the most to be a 3/10 80% of the time  PT Long Term Goal 7: no falls in the past two weeks  Problem List Patient Active Problem List   Diagnosis Date Noted  . Weakness of both legs 05/09/2014  . Poor balance 05/09/2014  . Difficulty in walking(719.7) 05/09/2014  . Chest pain 03/23/2014  . CHF (congestive heart failure) 02/12/2014  . Exertional dyspnea 02/12/2014  . Chest pain of uncertain etiology 32/44/0102  . Diabetes 01/01/2014  . Chronic kidney disease (CKD) stage G3b/A2, moderately decreased glomerular filtration rate (GFR) between 30-44 mL/min/1.73 square meter and albuminuria creatinine ratio between 30-299 mg/g 12/14/2013  . NSTEMI (non-ST elevated myocardial infarction) 11/28/2013  . HCAP (healthcare-associated pneumonia) 11/27/2013  . Community acquired pneumonia 11/27/2013  . Acute on chronic systolic heart failure 72/53/6644  . Other pancytopenia 01/08/2013  . Non-ST elevation MI (NSTEMI) 01/06/2013  . High output heart failure 10/25/2012  . Myelodysplastic syndrome with 5 q minus 10/17/2012  . History of colonic polyps 02/28/2012  . Anemia 01/10/2012  . Angina effort 01/06/2012  . AVM (arteriovenous malformation) of colon without hemorrhage 01/04/2012  . GI bleeding 10/09/2011  . Long term current use of anticoagulant 12/22/2010  . CAROTID ARTERY STENOSIS 10/16/2010  . DYSLIPIDEMIA 04/11/2009  .  CAD 04/11/2009  . Atrial fibrillation 04/11/2009  . PVD 04/11/2009  . OSTEOARTHRITIS 04/11/2009    PT Plan of Care PT Home Exercise Plan: given   GP Functional Assessment Tool Used: clinical judgement Functional Limitation: Mobility: Walking and moving around Mobility: Walking and Moving Around Current Status ([id number]): At least 80 percent but less than 100 percent impaired, limited or restricted Mobility: Walking and Moving Around Goal Status [phone]): At least 60 percent but less  than 80 percent impaired, limited or restricted  Jorian Willhoite,CINDY 05/09/2014, 2:03 PM  Physician Documentation Your signature is required to indicate approval of the treatment plan as stated above.  Please sign and either send electronically or make a copy of this report for your files and return this physician signed original.   Please mark one 1.__approve of plan  2. ___approve of plan with the following conditions.   ______________________________                                                          _____________________ Physician Signature                                                                                                             Date

## 2014-05-13 ENCOUNTER — Encounter (HOSPITAL_COMMUNITY): Payer: Medicare Other | Attending: Hematology and Oncology

## 2014-05-13 DIAGNOSIS — D46C Myelodysplastic syndrome with isolated del(5q) chromosomal abnormality: Secondary | ICD-10-CM

## 2014-05-13 LAB — CBC
HEMATOCRIT: 24 % — AB (ref 39.0–52.0)
Hemoglobin: 8.1 g/dL — ABNORMAL LOW (ref 13.0–17.0)
MCH: 30.5 pg (ref 26.0–34.0)
MCHC: 33.8 g/dL (ref 30.0–36.0)
MCV: 90.2 fL (ref 78.0–100.0)
Platelets: 101 10*3/uL — ABNORMAL LOW (ref 150–400)
RBC: 2.66 MIL/uL — AB (ref 4.22–5.81)
RDW: 17.8 % — ABNORMAL HIGH (ref 11.5–15.5)
WBC: 3.4 10*3/uL — AB (ref 4.0–10.5)

## 2014-05-13 LAB — PREPARE RBC (CROSSMATCH)

## 2014-05-13 NOTE — Addendum Note (Signed)
Addended by: Kurtis Bushman A on: 05/13/2014 01:58 PM   Modules accepted: Orders, SmartSet

## 2014-05-13 NOTE — Addendum Note (Signed)
Addended by: Baird Cancer on: 05/13/2014 01:26 PM   Modules accepted: Orders, SmartSet

## 2014-05-13 NOTE — Progress Notes (Signed)
Patient scheduled for blood transfusion on 8/19 and patient notified

## 2014-05-13 NOTE — Progress Notes (Signed)
LABS FOR CBC

## 2014-05-14 ENCOUNTER — Encounter (HOSPITAL_BASED_OUTPATIENT_CLINIC_OR_DEPARTMENT_OTHER): Payer: Medicare Other

## 2014-05-14 ENCOUNTER — Encounter (HOSPITAL_COMMUNITY): Payer: Medicare Other

## 2014-05-14 VITALS — BP 124/50 | HR 50 | Temp 97.4°F | Resp 18

## 2014-05-14 DIAGNOSIS — D46C Myelodysplastic syndrome with isolated del(5q) chromosomal abnormality: Secondary | ICD-10-CM | POA: Diagnosis not present

## 2014-05-14 MED ORDER — ACETAMINOPHEN 325 MG PO TABS
650.0000 mg | ORAL_TABLET | Freq: Once | ORAL | Status: AC
  Administered 2014-05-14: 650 mg via ORAL

## 2014-05-14 MED ORDER — SODIUM CHLORIDE 0.9 % IJ SOLN
10.0000 mL | INTRAMUSCULAR | Status: AC | PRN
  Administered 2014-05-14: 10 mL

## 2014-05-14 MED ORDER — SODIUM CHLORIDE 0.9 % IV SOLN
250.0000 mL | Freq: Once | INTRAVENOUS | Status: AC
  Administered 2014-05-14: 250 mL via INTRAVENOUS

## 2014-05-14 MED ORDER — DIPHENHYDRAMINE HCL 25 MG PO CAPS
ORAL_CAPSULE | ORAL | Status: AC
  Filled 2014-05-14: qty 1

## 2014-05-14 MED ORDER — ACETAMINOPHEN 325 MG PO TABS
ORAL_TABLET | ORAL | Status: AC
  Filled 2014-05-14: qty 2

## 2014-05-14 MED ORDER — DIPHENHYDRAMINE HCL 25 MG PO CAPS
25.0000 mg | ORAL_CAPSULE | Freq: Once | ORAL | Status: AC
  Administered 2014-05-14: 25 mg via ORAL

## 2014-05-15 ENCOUNTER — Ambulatory Visit (HOSPITAL_COMMUNITY)
Admission: RE | Admit: 2014-05-15 | Discharge: 2014-05-15 | Disposition: A | Discharge status: Home / Self Care | Payer: Medicare Other | Source: Ambulatory Visit | Attending: Physical Therapy | Admitting: Physical Therapy

## 2014-05-15 ENCOUNTER — Encounter (HOSPITAL_COMMUNITY): Payer: Medicare Other

## 2014-05-15 DIAGNOSIS — IMO0001 Reserved for inherently not codable concepts without codable children: Secondary | ICD-10-CM | POA: Diagnosis not present

## 2014-05-15 LAB — TYPE AND SCREEN
ABO/RH(D): A NEG
ANTIBODY SCREEN: NEGATIVE
UNIT DIVISION: 0
Unit division: 0

## 2014-05-15 NOTE — Progress Notes (Signed)
Physical Therapy Treatment Patient Details  Name: [name] MRN: [id number] Date of Birth: 1932-02-22  Today's Date: 05/15/2014 Time: 7062-3762 PT Time Calculation (min): 49 min  Visit#: 2 of 12  Re-eval: 06/08/14 Authorization: medicare  Authorization Visit#: 2 of 10  Charges:  Gait 1646-1700 (14'), therex 8315-1761 (30')  Subjective: Symptoms/Limitations Symptoms: Pt states it is hard to keep his posture upright but he is being complaint with his exercises.  Pt reports no pain today. Pain Assessment Currently in Pain?: No/denies  Precautions/Restrictions     Exercise/Treatments Standing Heel Raises: 10 reps;Limitations Heel Raises Limitations: toeraises 10 reps Gait Training: with SPC X 200 feet Seated Long Arc Quad: 10 reps Sidelying Hip ABduction: 10 reps Prone  Hamstring Curl: 10 reps Hip Extension: 10 reps      Physical Therapy Assessment and Plan PT Assessment and Plan Clinical Impression Statement: Reviewed HEP with pt having difficulty assuming full prone position.  Pt requries therapist facilitation to increase ROM with therex.  Began ambulation with SPC working on balance and gait quality.  Cues for posturing  as patient tends to forward flex.  Pt reported seated postural exercises were hardest for him.    PT Plan: Concentrate on improving standing posture as well as core and LE strengthening.       Problem List Patient Active Problem List   Diagnosis Date Noted  . Weakness of both legs 05/09/2014  . Poor balance 05/09/2014  . Difficulty in walking(719.7) 05/09/2014  . Chest pain 03/23/2014  . CHF (congestive heart failure) 02/12/2014  . Exertional dyspnea 02/12/2014  . Chest pain of uncertain etiology 60/73/7106  . Diabetes 01/01/2014  . Chronic kidney disease (CKD) stage G3b/A2, moderately decreased glomerular filtration rate (GFR) between 30-44 mL/min/1.73 square meter and albuminuria creatinine ratio between 30-299 mg/g 12/14/2013  .  NSTEMI (non-ST elevated myocardial infarction) 11/28/2013  . HCAP (healthcare-associated pneumonia) 11/27/2013  . Community acquired pneumonia 11/27/2013  . Acute on chronic systolic heart failure 26/94/8546  . Other pancytopenia 01/08/2013  . Non-ST elevation MI (NSTEMI) 01/06/2013  . High output heart failure 10/25/2012  . Myelodysplastic syndrome with 5 q minus 10/17/2012  . History of colonic polyps 02/28/2012  . Anemia 01/10/2012  . Angina effort 01/06/2012  . AVM (arteriovenous malformation) of colon without hemorrhage 01/04/2012  . GI bleeding 10/09/2011  . Long term current use of anticoagulant 12/22/2010  . CAROTID ARTERY STENOSIS 10/16/2010  . DYSLIPIDEMIA 04/11/2009  . CAD 04/11/2009  . Atrial fibrillation 04/11/2009  . PVD 04/11/2009  . OSTEOARTHRITIS 04/11/2009       GP    Teena Irani, PTA/CLT 05/15/2014, 5:49 PM

## 2014-05-16 ENCOUNTER — Ambulatory Visit (HOSPITAL_COMMUNITY): Payer: Medicare Other | Admitting: Physical Therapy

## 2014-05-17 ENCOUNTER — Ambulatory Visit (HOSPITAL_COMMUNITY)
Admission: RE | Admit: 2014-05-17 | Discharge: 2014-05-17 | Disposition: A | Discharge status: Home / Self Care | Payer: Medicare Other | Source: Ambulatory Visit | Attending: Oncology | Admitting: Oncology

## 2014-05-17 DIAGNOSIS — R262 Difficulty in walking, not elsewhere classified: Secondary | ICD-10-CM

## 2014-05-17 DIAGNOSIS — IMO0001 Reserved for inherently not codable concepts without codable children: Secondary | ICD-10-CM | POA: Diagnosis not present

## 2014-05-17 DIAGNOSIS — R29898 Other symptoms and signs involving the musculoskeletal system: Secondary | ICD-10-CM

## 2014-05-17 DIAGNOSIS — R2689 Other abnormalities of gait and mobility: Secondary | ICD-10-CM

## 2014-05-17 NOTE — Progress Notes (Addendum)
Physical Therapy Treatment Patient Details  Name: [name] MRN: [id number] Date of Birth: [birth year]  Today's Date: 05/17/2014 Time: 1941-7408 PT Time Calculation (min): 50 min Charge: TE 1448-1856, Gait [phone]  Visit#: 3 of 12  Re-eval: 06/08/14 Assessment Diagnosis: difficulty walking  Next MD Visit: Blood work every Monday Prior Therapy: 2014 for back   Authorization: medicare  Authorization Time Period:    Authorization Visit#: 3 of 10   Subjective: Symptoms/Limitations Symptoms: Pt reports he feels a little nervous took a pain medication before therapy today,  Pain scale 5/10 Bil LE Pain Assessment Currently in Pain?: Yes Pain Score: 5  Pain Location: Leg Pain Orientation: Right;Left  Objective:   Exercise/Treatments Stretches Hip Flexor Stretch: 2 reps;20 seconds;Limitations Hip Flexor Stretch Limitations: on 8in step Standing Heel Raises: 10 reps;Limitations Heel Raises Limitations: heel and toe raises with tactile cueing for posture Functional Squats: 10 reps Scapular Retraction: Both;10 reps;Theraband Theraband Level (Scapular Retraction): Level 3 (Green) Row: Both;10 reps;Theraband Theraband Level (Row): Level 3 (Green) Shoulder Extension: Both;10 reps;Theraband Theraband Level (Shoulder Extension): Level 3 (Green) Other Standing Lumbar Exercises: Gait training with SPC x 452 feet Other Standing Lumbar Exercises: SLS 3 attempts max 10" BIl with min-mod assistance     Physical Therapy Assessment and Plan PT Assessment and Plan Clinical Impression Statement: Session focus on improving postural awareness to improve balance, gait and reduce pain.  Pt required constant cueing to reduce forward flexed trunk.  Multimodal cueing including visual, verbal and tactile cueing to improve posutre through session. Progressed therex for gluteal strengthening and continues with gait training with cueing to improve sequencing, posture and overall gait quality.   Pt able to demonstrate appropraite 2 point sequencing through did require cueing for cane placement. PT Plan: Concentrate on improving standing posture as well as core and LE strengthening.      Goals PT Short Term Goals PT Short Term Goal 1: Pt to be able to stand for 10 minutes without increased pain to be able to make a sandwich for a meal PT Short Term Goal 1 - Progress: Progressing toward goal PT Short Term Goal 2: Pt to be able to walk for 15 minutes for better health habits.  PT Short Term Goal 2 - Progress: Progressing toward goal PT Short Term Goal 3: Pt to walk with 20 degree forward flexion  PT Short Term Goal 3 - Progress: Progressing toward goal PT Long Term Goals PT Long Term Goal 1: I in advance HEP PT Long Term Goal 2: Pt to be able to stand for 20 minutes to socialize Long Term Goal 3: Pt to be comfortable walking inside with a cane Long Term Goal 4: Patient to be able to walk with his walker for 30 minutes at a time PT Long Term Goal 5: forward bent posture to be decreased to only 10 degree forward bend  PT Long Term Goal 6: Pain at the most to be a 3/10 80% of the time  PT Long Term Goal 7: no falls in the past two weeks  Problem List Patient Active Problem List   Diagnosis Date Noted  . Weakness of both legs 05/09/2014  . Poor balance 05/09/2014  . Difficulty in walking(719.7) 05/09/2014  . Chest pain 03/23/2014  . CHF (congestive heart failure) 02/12/2014  . Exertional dyspnea 02/12/2014  . Chest pain of uncertain etiology 37/85/8850  . Diabetes 01/01/2014  . Chronic kidney disease (CKD) stage G3b/A2, moderately decreased glomerular filtration rate (GFR) between 30-44 mL/min/1.73 square  meter and albuminuria creatinine ratio between 30-299 mg/g 12/14/2013  . NSTEMI (non-ST elevated myocardial infarction) 11/28/2013  . HCAP (healthcare-associated pneumonia) 11/27/2013  . Community acquired pneumonia 11/27/2013  . Acute on chronic systolic heart failure  42/06/3127  . Other pancytopenia 01/08/2013  . Non-ST elevation MI (NSTEMI) 01/06/2013  . High output heart failure 10/25/2012  . Myelodysplastic syndrome with 5 q minus 10/17/2012  . History of colonic polyps 02/28/2012  . Anemia 01/10/2012  . Angina effort 01/06/2012  . AVM (arteriovenous malformation) of colon without hemorrhage 01/04/2012  . GI bleeding 10/09/2011  . Long term current use of anticoagulant 12/22/2010  . CAROTID ARTERY STENOSIS 10/16/2010  . DYSLIPIDEMIA 04/11/2009  . CAD 04/11/2009  . Atrial fibrillation 04/11/2009  . PVD 04/11/2009  . OSTEOARTHRITIS 04/11/2009    PT - End of Session Equipment Utilized During Treatment: Gait belt Activity Tolerance: Patient tolerated treatment well General Behavior During Therapy: [facility] for tasks assessed/performed  GP    Aldona Lento 05/17/2014, 5:58 PM

## 2014-05-20 ENCOUNTER — Encounter (HOSPITAL_BASED_OUTPATIENT_CLINIC_OR_DEPARTMENT_OTHER): Payer: Medicare Other

## 2014-05-20 DIAGNOSIS — D46C Myelodysplastic syndrome with isolated del(5q) chromosomal abnormality: Secondary | ICD-10-CM | POA: Diagnosis not present

## 2014-05-20 LAB — CBC
HCT: 28.9 % — ABNORMAL LOW (ref 39.0–52.0)
Hemoglobin: 9.9 g/dL — ABNORMAL LOW (ref 13.0–17.0)
MCH: 30.9 pg (ref 26.0–34.0)
MCHC: 34.3 g/dL (ref 30.0–36.0)
MCV: 90.3 fL (ref 78.0–100.0)
Platelets: 94 10*3/uL — ABNORMAL LOW (ref 150–400)
RBC: 3.2 MIL/uL — ABNORMAL LOW (ref 4.22–5.81)
RDW: 17.9 % — ABNORMAL HIGH (ref 11.5–15.5)
WBC: 2.7 10*3/uL — ABNORMAL LOW (ref 4.0–10.5)

## 2014-05-20 NOTE — Progress Notes (Signed)
LABS DRAWN FOR CBC

## 2014-05-22 ENCOUNTER — Ambulatory Visit (HOSPITAL_COMMUNITY): Payer: Medicare Other

## 2014-05-22 ENCOUNTER — Ambulatory Visit (HOSPITAL_COMMUNITY)
Admission: RE | Admit: 2014-05-22 | Discharge: 2014-05-22 | Disposition: A | Discharge status: Home / Self Care | Payer: Medicare Other | Source: Ambulatory Visit | Attending: Oncology | Admitting: Oncology

## 2014-05-22 DIAGNOSIS — IMO0001 Reserved for inherently not codable concepts without codable children: Secondary | ICD-10-CM | POA: Diagnosis not present

## 2014-05-22 NOTE — Progress Notes (Signed)
Physical Therapy Treatment Patient Details  Name: [name] MRN: [id number] Date of Birth: 1932-03-20  Today's Date: 05/22/2014 Time: 1324-4010 PT Time Calculation (min): 44 min Visit#: 4 of 12  Re-eval: 06/08/14 Authorization: medicare  Authorization Visit#: 4 of 10  Charges:  therex 2725-3664 (28'), gait 1715-1730 (15')   Subjective:  Pt states he is not hurting today.  Reports compliance with exercise.    Exercise/Treatments Standing Heel Raises: 15 reps;Limitations Heel Raises Limitations: toeraises 15 reps Knee Flexion: 2 sets;10 reps;Limitations Knee Flexion Limitations: bilaterally Forward Lunges: 10 reps;Both;Limitations Forward Lunges Limitations: onto 6" box Functional Squat: 2 sets;10 reps Gait Training: with SPC X 166 feet X 2 bouts Other Standing Knee Exercises: retro ambulation, side stepping with SPC 1RT each    Physical Therapy Assessment and Plan PT Assessment and Plan Clinical Impression Statement: Progressed with standing exercises to increase LE strength and dynamic balance activities.  Pt with 2 LOB requiring min assist to correct/keep from falling.  Pt requires postural cues with ambuation due to tendency of forward posture.  Instructed in proper descending of curbs due to unsafe practice demonstrated.  Improved sequencing with SPC today.  Pt only required 2 seated rest breaks during session. PT Plan: Concentrate on improving standing posture as well as core and LE strengthening.       Problem List Patient Active Problem List   Diagnosis Date Noted  . Weakness of both legs 05/09/2014  . Poor balance 05/09/2014  . Difficulty in walking(719.7) 05/09/2014  . Chest pain 03/23/2014  . CHF (congestive heart failure) 02/12/2014  . Exertional dyspnea 02/12/2014  . Chest pain of uncertain etiology 40/34/7425  . Diabetes 01/01/2014  . Chronic kidney disease (CKD) stage G3b/A2, moderately decreased glomerular filtration rate (GFR) between 30-44  mL/min/1.73 square meter and albuminuria creatinine ratio between 30-299 mg/g 12/14/2013  . NSTEMI (non-ST elevated myocardial infarction) 11/28/2013  . HCAP (healthcare-associated pneumonia) 11/27/2013  . Community acquired pneumonia 11/27/2013  . Acute on chronic systolic heart failure 95/63/8756  . Other pancytopenia 01/08/2013  . Non-ST elevation MI (NSTEMI) 01/06/2013  . High output heart failure 10/25/2012  . Myelodysplastic syndrome with 5 q minus 10/17/2012  . History of colonic polyps 02/28/2012  . Anemia 01/10/2012  . Angina effort 01/06/2012  . AVM (arteriovenous malformation) of colon without hemorrhage 01/04/2012  . GI bleeding 10/09/2011  . Long term current use of anticoagulant 12/22/2010  . CAROTID ARTERY STENOSIS 10/16/2010  . DYSLIPIDEMIA 04/11/2009  . CAD 04/11/2009  . Atrial fibrillation 04/11/2009  . PVD 04/11/2009  . OSTEOARTHRITIS 04/11/2009    PT - End of Session Equipment Utilized During Treatment: Gait belt Activity Tolerance: Patient tolerated treatment well General Behavior During Therapy: [facility] for tasks assessed/performed  GP    Teena Irani, PTA/CLT 05/22/2014, 5:51 PM

## 2014-05-24 ENCOUNTER — Ambulatory Visit (HOSPITAL_COMMUNITY)
Admission: RE | Admit: 2014-05-24 | Discharge: 2014-05-24 | Disposition: A | Discharge status: Home / Self Care | Payer: Medicare Other | Source: Ambulatory Visit | Attending: Oncology | Admitting: Oncology

## 2014-05-24 ENCOUNTER — Ambulatory Visit (HOSPITAL_COMMUNITY): Payer: Medicare Other

## 2014-05-24 DIAGNOSIS — IMO0001 Reserved for inherently not codable concepts without codable children: Secondary | ICD-10-CM | POA: Diagnosis not present

## 2014-05-24 DIAGNOSIS — R262 Difficulty in walking, not elsewhere classified: Secondary | ICD-10-CM

## 2014-05-24 DIAGNOSIS — R29898 Other symptoms and signs involving the musculoskeletal system: Secondary | ICD-10-CM

## 2014-05-24 DIAGNOSIS — R2689 Other abnormalities of gait and mobility: Secondary | ICD-10-CM

## 2014-05-24 NOTE — Progress Notes (Signed)
Physical Therapy Treatment Patient Details  Name: [name] MRN: [id number] Date of Birth: [birth year]  Today's Date: 05/24/2014 Time: 1730-1820 PT Time Calculation (min): 50 min Charge: TE 1730-1808, NMR 4765-4650  Visit#: 5 of 12  Re-eval: 06/08/14 Assessment Diagnosis: difficulty walking  Next MD Visit: Blood work every Monday Prior Therapy: 2014 for back   Authorization: medicare  Authorization Time Period:    Authorization Visit#: 5 of 10   Subjective: Symptoms/Limitations Symptoms: Patient reports falling in [place] as hi walker "got stuck on something and I tripped on in"  Pain Assessment Currently in Pain?: No/denies Pain Score: 5  Pain Location: Back  Objective:   Exercise/Treatments   Standing Scapular Retraction: Both;10 reps;Theraband Theraband Level (Scapular Retraction): Level 3 (Green) Row: Both;10 reps;Theraband Theraband Level (Row): Level 3 (Green) Shoulder Extension: Both;10 reps;Theraband Theraband Level (Shoulder Extension): Level 3 (Green) Stretches Active Hamstring Stretch: 4 reps;Limitations;20 seconds Quad Stretch: 3 reps;30 seconds Hip Flexor Stretch: 4 reps;20 seconds;Limitations Hip Flexor Stretch Limitations: to 14"  Gastroc Stretch: 20 seconds;4 reps;Limitations Gastroc Stretch Limitations: wedge  Standing Heel Raises: 15 reps;Limitations Heel Raises Limitations: toeraises 15 reps Forward Lunges: 10 reps;Both;Limitations Forward Lunges Limitations: onto 6" box Functional Squat: 2 sets;10 reps Gait Training: with SPC X 226 no rest breaks Other Standing Knee Exercises: retro ambulation, tandem and side stepping with SPC 1RT each    Physical Therapy Assessment and Plan PT Assessment and Plan Clinical Impression Statement: Pt improving gait mechanics and sequencing with SPC with no rest breaks required x 226 feet, constant cueing required for posture to reduce forward flexed trunk. Postural strengthening with therband with  cueing for form and technique with scapular retraction. No LOB episdoes during gait training, progress to tandem gait activtieis to improve balance with NBOS.   PT Plan: Concentrate on improving standing posture as well as core and LE strengthening.      Goals PT Short Term Goals PT Short Term Goal 1: Pt to be able to stand for 10 minutes without increased pain to be able to make a sandwich for a meal PT Short Term Goal 1 - Progress: Progressing toward goal PT Short Term Goal 2: Pt to be able to walk for 15 minutes for better health habits.  PT Short Term Goal 2 - Progress: Progressing toward goal PT Short Term Goal 3: Pt to walk with 20 degree forward flexion  PT Short Term Goal 3 - Progress: Progressing toward goal PT Long Term Goals PT Long Term Goal 1: I in advance HEP PT Long Term Goal 2: Pt to be able to stand for 20 minutes to socialize PT Long Term Goal 2 - Progress: Progressing toward goal Long Term Goal 3: Pt to be comfortable walking inside with a cane Long Term Goal 3 Progress: Progressing toward goal Long Term Goal 4: Patient to be able to walk with his walker for 30 minutes at a time PT Long Term Goal 5: forward bent posture to be decreased to only 10 degree forward bend  PT Long Term Goal 6: Pain at the most to be a 3/10 80% of the time  PT Long Term Goal 7: no falls in the past two weeks  Problem List Patient Active Problem List   Diagnosis Date Noted  . Weakness of both legs 05/09/2014  . Poor balance 05/09/2014  . Difficulty in walking(719.7) 05/09/2014  . Chest pain 03/23/2014  . CHF (congestive heart failure) 02/12/2014  . Exertional dyspnea 02/12/2014  . Chest pain of  uncertain etiology 19/14/7829  . Diabetes 01/01/2014  . Chronic kidney disease (CKD) stage G3b/A2, moderately decreased glomerular filtration rate (GFR) between 30-44 mL/min/1.73 square meter and albuminuria creatinine ratio between 30-299 mg/g 12/14/2013  . NSTEMI (non-ST elevated myocardial  infarction) 11/28/2013  . HCAP (healthcare-associated pneumonia) 11/27/2013  . Community acquired pneumonia 11/27/2013  . Acute on chronic systolic heart failure 56/21/3086  . Other pancytopenia 01/08/2013  . Non-ST elevation MI (NSTEMI) 01/06/2013  . High output heart failure 10/25/2012  . Myelodysplastic syndrome with 5 q minus 10/17/2012  . History of colonic polyps 02/28/2012  . Anemia 01/10/2012  . Angina effort 01/06/2012  . AVM (arteriovenous malformation) of colon without hemorrhage 01/04/2012  . GI bleeding 10/09/2011  . Long term current use of anticoagulant 12/22/2010  . CAROTID ARTERY STENOSIS 10/16/2010  . DYSLIPIDEMIA 04/11/2009  . CAD 04/11/2009  . Atrial fibrillation 04/11/2009  . PVD 04/11/2009  . OSTEOARTHRITIS 04/11/2009    PT - End of Session Equipment Utilized During Treatment: Gait belt Activity Tolerance: Patient tolerated treatment well General Behavior During Therapy: [facility] for tasks assessed/performed  GP    Aldona Lento 05/24/2014, 6:36 PM

## 2014-05-27 ENCOUNTER — Ambulatory Visit (HOSPITAL_COMMUNITY)
Admission: RE | Admit: 2014-05-27 | Discharge: 2014-05-27 | Disposition: A | Discharge status: Home / Self Care | Payer: Medicare Other | Source: Ambulatory Visit | Attending: Oncology | Admitting: Oncology

## 2014-05-27 ENCOUNTER — Encounter (HOSPITAL_BASED_OUTPATIENT_CLINIC_OR_DEPARTMENT_OTHER): Payer: Medicare Other

## 2014-05-27 ENCOUNTER — Ambulatory Visit (HOSPITAL_COMMUNITY): Payer: Medicare Other | Admitting: Physical Therapy

## 2014-05-27 DIAGNOSIS — D46C Myelodysplastic syndrome with isolated del(5q) chromosomal abnormality: Secondary | ICD-10-CM

## 2014-05-27 DIAGNOSIS — IMO0001 Reserved for inherently not codable concepts without codable children: Secondary | ICD-10-CM | POA: Diagnosis not present

## 2014-05-27 DIAGNOSIS — R262 Difficulty in walking, not elsewhere classified: Secondary | ICD-10-CM

## 2014-05-27 DIAGNOSIS — R2689 Other abnormalities of gait and mobility: Secondary | ICD-10-CM

## 2014-05-27 DIAGNOSIS — R29898 Other symptoms and signs involving the musculoskeletal system: Secondary | ICD-10-CM

## 2014-05-27 LAB — CBC
HCT: 26.9 % — ABNORMAL LOW (ref 39.0–52.0)
HEMOGLOBIN: 9 g/dL — AB (ref 13.0–17.0)
MCH: 30.7 pg (ref 26.0–34.0)
MCHC: 33.5 g/dL (ref 30.0–36.0)
MCV: 91.8 fL (ref 78.0–100.0)
PLATELETS: 95 10*3/uL — AB (ref 150–400)
RBC: 2.93 MIL/uL — AB (ref 4.22–5.81)
RDW: 18.8 % — ABNORMAL HIGH (ref 11.5–15.5)
WBC: 4 10*3/uL (ref 4.0–10.5)

## 2014-05-27 NOTE — Progress Notes (Signed)
[name] presented for [employer]. Labs per MD order drawn via Peripheral Line 21 gauge needle inserted in right antecubital.  Good blood return present. Procedure without incident.  Needle removed intact. Patient tolerated procedure well.

## 2014-05-27 NOTE — Progress Notes (Signed)
Physical Therapy Treatment Patient Details  Name: [name] MRN: [id number] Date of Birth: 03/17/1932  Today's Date: 05/27/2014 Time: 1518-1608 PT Time Calculation (min): 76 min Charge TE 7262-0355  Visit#: 6 of 12  Re-eval: 06/08/14 Assessment Diagnosis: difficulty walking  Next MD Visit: Blood work every Monday Prior Therapy: 2014 for back   Authorization: medicare  Authorization Time Period:    Authorization Visit#: 6 of 10   Subjective: Symptoms/Limitations Symptoms: Feeling good today, no c/o.  Pt reports it has been 2 weeks since he has needed more blood, hopes none required tomorrow. Pain Assessment Currently in Pain?: No/denies  Objective:   Exercise/Treatments Stretches Hip Flexor Stretch: 3 reps;30 seconds;Limitations Hip Flexor Stretch Limitations: on 8in step Prone on Elbows Stretch: Limitations Prone on Elbows Stretch Limitations: 2 minutes Press Ups: Limitations Press Ups Limitations: 10 x5" Aerobic Stationary Bike: Nustep x 8 min hill level 3 for activity tolerance Standing Other Standing Lumbar Exercises: Gait training with SPC x 452 feet Other Standing Lumbar Exercises: 3D hip excursion Seated Other Seated Lumbar Exercises: 3D thoracic excursion with UE movements Other Seated Lumbar Exercises: Wback 10x Prone  Straight Leg Raise: 10 reps;Limitations Straight Leg Raises Limitations: Bil    Physical Therapy Assessment and Plan PT Assessment and Plan Clinical Impression Statement: Session focus on improving posture awareness, stretches for hip flexors and prone activities to encourage lumbar and LE extension.  Added thoracic excursion with UE movements, pt stated it loosened up.  Improved posture with minimal cueing for posture required with SPC gait training today.  Ended session with Nustep for activity tolerance and LE strengthening.   PT Plan: Concentrate on improving standing posture as well as core and LE strengthening.      Goals PT  Short Term Goals PT Short Term Goal 1: Pt to be able to stand for 10 minutes without increased pain to be able to make a sandwich for a meal PT Short Term Goal 1 - Progress: Progressing toward goal PT Short Term Goal 2: Pt to be able to walk for 15 minutes for better health habits.  PT Short Term Goal 2 - Progress: Progressing toward goal PT Short Term Goal 3: Pt to walk with 20 degree forward flexion  PT Short Term Goal 3 - Progress: Progressing toward goal PT Long Term Goals PT Long Term Goal 1: I in advance HEP PT Long Term Goal 2: Pt to be able to stand for 20 minutes to socialize Long Term Goal 3: Pt to be comfortable walking inside with a cane Long Term Goal 4: Patient to be able to walk with his walker for 30 minutes at a time PT Long Term Goal 5: forward bent posture to be decreased to only 10 degree forward bend  PT Long Term Goal 6: Pain at the most to be a 3/10 80% of the time  PT Long Term Goal 7: no falls in the past two weeks  Problem List Patient Active Problem List   Diagnosis Date Noted  . Weakness of both legs 05/09/2014  . Poor balance 05/09/2014  . Difficulty in walking(719.7) 05/09/2014  . Chest pain 03/23/2014  . CHF (congestive heart failure) 02/12/2014  . Exertional dyspnea 02/12/2014  . Chest pain of uncertain etiology 97/41/6384  . Diabetes 01/01/2014  . Chronic kidney disease (CKD) stage G3b/A2, moderately decreased glomerular filtration rate (GFR) between 30-44 mL/min/1.73 square meter and albuminuria creatinine ratio between 30-299 mg/g 12/14/2013  . NSTEMI (non-ST elevated myocardial infarction) 11/28/2013  . HCAP (  healthcare-associated pneumonia) 11/27/2013  . Community acquired pneumonia 11/27/2013  . Acute on chronic systolic heart failure 32/20/2542  . Other pancytopenia 01/08/2013  . Non-ST elevation MI (NSTEMI) 01/06/2013  . High output heart failure 10/25/2012  . Myelodysplastic syndrome with 5 q minus 10/17/2012  . History of colonic polyps  02/28/2012  . Anemia 01/10/2012  . Angina effort 01/06/2012  . AVM (arteriovenous malformation) of colon without hemorrhage 01/04/2012  . GI bleeding 10/09/2011  . Long term current use of anticoagulant 12/22/2010  . CAROTID ARTERY STENOSIS 10/16/2010  . DYSLIPIDEMIA 04/11/2009  . CAD 04/11/2009  . Atrial fibrillation 04/11/2009  . PVD 04/11/2009  . OSTEOARTHRITIS 04/11/2009    PT - End of Session Equipment Utilized During Treatment: Gait belt Activity Tolerance: Patient tolerated treatment well General Behavior During Therapy: [facility] for tasks assessed/performed  GP    Aldona Lento 05/27/2014, 4:12 PM

## 2014-05-27 NOTE — Progress Notes (Signed)
Staff member from Dr. Johnsie Cancel office called and stated that labs were forwarded to wrong MD.  Dr. Barnet Glasgow notified of today's lab results.

## 2014-05-28 ENCOUNTER — Other Ambulatory Visit (HOSPITAL_COMMUNITY): Payer: Self-pay

## 2014-05-29 ENCOUNTER — Ambulatory Visit (HOSPITAL_COMMUNITY)
Admission: RE | Admit: 2014-05-29 | Discharge: 2014-05-29 | Disposition: A | Discharge status: Home / Self Care | Payer: Medicare Other | Source: Ambulatory Visit | Attending: Oncology | Admitting: Oncology

## 2014-05-29 ENCOUNTER — Ambulatory Visit (HOSPITAL_COMMUNITY): Payer: Medicare Other | Admitting: Physical Therapy

## 2014-05-29 DIAGNOSIS — Z9181 History of falling: Secondary | ICD-10-CM | POA: Insufficient documentation

## 2014-05-29 DIAGNOSIS — E119 Type 2 diabetes mellitus without complications: Secondary | ICD-10-CM | POA: Diagnosis not present

## 2014-05-29 DIAGNOSIS — R262 Difficulty in walking, not elsewhere classified: Secondary | ICD-10-CM | POA: Diagnosis not present

## 2014-05-29 DIAGNOSIS — M6281 Muscle weakness (generalized): Secondary | ICD-10-CM | POA: Diagnosis not present

## 2014-05-29 DIAGNOSIS — IMO0001 Reserved for inherently not codable concepts without codable children: Secondary | ICD-10-CM | POA: Diagnosis present

## 2014-05-29 NOTE — Progress Notes (Signed)
Physical Therapy Treatment Patient Details  Name: [name] MRN: [id number] Date of Birth: 06/05/1932  Today's Date: 05/29/2014 Time: 5686-1683 PT Time Calculation (min): 42 min Visit#: 7 of 12  Re-eval: 06/08/14 Authorization: medicare  Authorization Visit#: 7 of 10  Charges:  Gait 1648-1700 (12'), therex 7290-2111 (30')  Subjective: Symptoms/Limitations Symptoms: Pt reports compliance with his stretches at home.  States he did not have to get blood at his last MD appointment.  feeling stronger everyday.   Exercise/Treatments Stretches Active Hamstring Stretch: 4 reps;Limitations;20 seconds Aerobic Stationary Bike: nustep 10' level 3 resistance 3 for activity tolerance Standing Heel Raises: 15 reps;Limitations Heel Raises Limitations: toeraises 15 reps Knee Flexion: 2 sets;10 reps;Limitations Forward Lunges: 15 reps Forward Lunges Limitations: onto 6" box Functional Squat: 2 sets;10 reps Gait Training: with SPC X 332 no rest breaks Other Standing Knee Exercises: retro ambulation, tandem and side stepping with SPC 1RT each     Physical Therapy Assessment and Plan PT Assessment and Plan Clinical Impression Statement: Improving posture with gait and therapeutic activities.  Most difficulty keeping extension posture with retro gait.  Pt with increasing activity tolerance, extended ambulation distance today to 332 feet using SPC.  Ended session again with nustep today to further increase activity tolerance. PT Plan: Concentrate on improving standing posture as well as core and LE strengthening.  review mat activities/stretches.  Add postural three exerercise with theraband.       Problem List Patient Active Problem List   Diagnosis Date Noted  . Weakness of both legs 05/09/2014  . Poor balance 05/09/2014  . Difficulty in walking(719.7) 05/09/2014  . Chest pain 03/23/2014  . CHF (congestive heart failure) 02/12/2014  . Exertional dyspnea 02/12/2014  . Chest pain of  uncertain etiology 55/20/8022  . Diabetes 01/01/2014  . Chronic kidney disease (CKD) stage G3b/A2, moderately decreased glomerular filtration rate (GFR) between 30-44 mL/min/1.73 square meter and albuminuria creatinine ratio between 30-299 mg/g 12/14/2013  . NSTEMI (non-ST elevated myocardial infarction) 11/28/2013  . HCAP (healthcare-associated pneumonia) 11/27/2013  . Community acquired pneumonia 11/27/2013  . Acute on chronic systolic heart failure 33/61/2244  . Other pancytopenia 01/08/2013  . Non-ST elevation MI (NSTEMI) 01/06/2013  . High output heart failure 10/25/2012  . Myelodysplastic syndrome with 5 q minus 10/17/2012  . History of colonic polyps 02/28/2012  . Anemia 01/10/2012  . Angina effort 01/06/2012  . AVM (arteriovenous malformation) of colon without hemorrhage 01/04/2012  . GI bleeding 10/09/2011  . Long term current use of anticoagulant 12/22/2010  . CAROTID ARTERY STENOSIS 10/16/2010  . DYSLIPIDEMIA 04/11/2009  . CAD 04/11/2009  . Atrial fibrillation 04/11/2009  . PVD 04/11/2009  . OSTEOARTHRITIS 04/11/2009    PT - End of Session Equipment Utilized During Treatment: Gait belt Activity Tolerance: Patient tolerated treatment well General Behavior During Therapy: [facility] for tasks assessed/performed   Teena Irani, PTA/CLT 05/29/2014, 5:32 PM

## 2014-05-31 ENCOUNTER — Ambulatory Visit (HOSPITAL_COMMUNITY)
Admission: RE | Admit: 2014-05-31 | Payer: Medicare Other | Source: Ambulatory Visit | Attending: Oncology | Admitting: Oncology

## 2014-05-31 ENCOUNTER — Ambulatory Visit (HOSPITAL_COMMUNITY): Payer: Medicare Other

## 2014-05-31 ENCOUNTER — Telehealth (HOSPITAL_COMMUNITY): Payer: Self-pay

## 2014-06-04 ENCOUNTER — Encounter (HOSPITAL_COMMUNITY): Payer: Medicare Other | Attending: Hematology and Oncology

## 2014-06-04 DIAGNOSIS — D46C Myelodysplastic syndrome with isolated del(5q) chromosomal abnormality: Secondary | ICD-10-CM | POA: Diagnosis not present

## 2014-06-04 LAB — CBC
HCT: 22.6 % — ABNORMAL LOW (ref 39.0–52.0)
Hemoglobin: 7.9 g/dL — ABNORMAL LOW (ref 13.0–17.0)
MCH: 32.1 pg (ref 26.0–34.0)
MCHC: 35 g/dL (ref 30.0–36.0)
MCV: 91.9 fL (ref 78.0–100.0)
Platelets: 109 10*3/uL — ABNORMAL LOW (ref 150–400)
RBC: 2.46 MIL/uL — ABNORMAL LOW (ref 4.22–5.81)
RDW: 20.2 % — AB (ref 11.5–15.5)
WBC: 3.5 10*3/uL — ABNORMAL LOW (ref 4.0–10.5)

## 2014-06-05 ENCOUNTER — Ambulatory Visit (HOSPITAL_COMMUNITY)
Admission: RE | Admit: 2014-06-05 | Discharge: 2014-06-05 | Disposition: A | Discharge status: Home / Self Care | Payer: Medicare Other | Source: Ambulatory Visit | Attending: Oncology | Admitting: Oncology

## 2014-06-05 ENCOUNTER — Ambulatory Visit (HOSPITAL_COMMUNITY): Payer: Medicare Other | Admitting: Physical Therapy

## 2014-06-05 ENCOUNTER — Encounter (HOSPITAL_COMMUNITY): Payer: Self-pay

## 2014-06-05 ENCOUNTER — Encounter (HOSPITAL_BASED_OUTPATIENT_CLINIC_OR_DEPARTMENT_OTHER): Payer: Medicare Other

## 2014-06-05 VITALS — BP 118/50 | HR 56 | Temp 97.8°F | Resp 18

## 2014-06-05 DIAGNOSIS — D46C Myelodysplastic syndrome with isolated del(5q) chromosomal abnormality: Secondary | ICD-10-CM

## 2014-06-05 DIAGNOSIS — IMO0001 Reserved for inherently not codable concepts without codable children: Secondary | ICD-10-CM | POA: Diagnosis not present

## 2014-06-05 LAB — PREPARE RBC (CROSSMATCH)

## 2014-06-05 MED ORDER — SODIUM CHLORIDE 0.9 % IV SOLN
250.0000 mL | Freq: Once | INTRAVENOUS | Status: AC
  Administered 2014-06-05: 250 mL via INTRAVENOUS

## 2014-06-05 MED ORDER — DIPHENHYDRAMINE HCL 25 MG PO CAPS
25.0000 mg | ORAL_CAPSULE | Freq: Once | ORAL | Status: AC
Start: 2014-06-05 — End: 2014-06-05
  Administered 2014-06-05: 25 mg via ORAL
  Filled 2014-06-05: qty 1

## 2014-06-05 MED ORDER — ACETAMINOPHEN 325 MG PO TABS
650.0000 mg | ORAL_TABLET | Freq: Once | ORAL | Status: AC
  Administered 2014-06-05: 650 mg via ORAL
  Filled 2014-06-05: qty 2

## 2014-06-05 NOTE — Progress Notes (Signed)
Physical Therapy Treatment Patient Details  Name: [name] MRN: [id number] Date of Birth: 1932-03-22  Today's Date: 06/05/2014 Time: 6568-1275 PT Time Calculation (min): 50 min Visit#: 8 of 12  Re-eval: 06/08/14 Authorization: medicare  Authorization Visit#: 8 of 10 Charges:  therex 45   Subjective: Symptoms/Limitations Symptoms: PT states he fell asleep and missed his last appointment.  States his hemoglobin was 7 this morining and had to receive 2 units of blood.  States he feels better since receiving it.   Pain Assessment Currently in Pain?: No/denies   Exercise/Treatments Stretches Active Hamstring Stretch: 4 reps;20 seconds;Limitations Hip Flexor Stretch: 5 reps;10 seconds;Limitations Hip Flexor Stretch Limitations: to 14"  Gastroc Stretch: 20 seconds;4 reps;Limitations Gastroc Stretch Limitations: wedge  Aerobic Stationary Bike: nustep 10' level 3 resistance 3 for activity tolerance Standing Heel Raises: 15 reps;Limitations Heel Raises Limitations: toeraises 15 reps Knee Flexion: 2 sets;10 reps;Limitations Forward Lunges: 15 reps Forward Lunges Limitations: onto 6" box    Physical Therapy Assessment and Plan PT Assessment and Plan Clinical Impression Statement: Pt able to complete session today with only 1 seated rest break.  Minimal fatigue noted inspite of recent blood transfusion.   No advanced therex or strenuous activities added today. PT Plan: Concentrate on improving standing posture as well as core and LE strengthening.  review mat activities/stretches.  Add postural three exerercise with theraband.      Problem List Patient Active Problem List   Diagnosis Date Noted  . Weakness of both legs 05/09/2014  . Poor balance 05/09/2014  . Difficulty in walking(719.7) 05/09/2014  . Chest pain 03/23/2014  . CHF (congestive heart failure) 02/12/2014  . Exertional dyspnea 02/12/2014  . Chest pain of uncertain etiology 17/00/1749  . Diabetes 01/01/2014   . Chronic kidney disease (CKD) stage G3b/A2, moderately decreased glomerular filtration rate (GFR) between 30-44 mL/min/1.73 square meter and albuminuria creatinine ratio between 30-299 mg/g 12/14/2013  . NSTEMI (non-ST elevated myocardial infarction) 11/28/2013  . HCAP (healthcare-associated pneumonia) 11/27/2013  . Community acquired pneumonia 11/27/2013  . Acute on chronic systolic heart failure 44/96/7591  . Other pancytopenia 01/08/2013  . Non-ST elevation MI (NSTEMI) 01/06/2013  . High output heart failure 10/25/2012  . Myelodysplastic syndrome with 5 q minus 10/17/2012  . History of colonic polyps 02/28/2012  . Anemia 01/10/2012  . Angina effort 01/06/2012  . AVM (arteriovenous malformation) of colon without hemorrhage 01/04/2012  . GI bleeding 10/09/2011  . Long term current use of anticoagulant 12/22/2010  . CAROTID ARTERY STENOSIS 10/16/2010  . DYSLIPIDEMIA 04/11/2009  . CAD 04/11/2009  . Atrial fibrillation 04/11/2009  . PVD 04/11/2009  . OSTEOARTHRITIS 04/11/2009    PT - End of Session Equipment Utilized During Treatment: Gait belt Activity Tolerance: Patient tolerated treatment well General Behavior During Therapy: [facility] for tasks assessed/performed  GP    Teena Irani, PTA/CLT 06/05/2014, 5:34 PM

## 2014-06-05 NOTE — Progress Notes (Signed)
Patient tolerated 2 units of PRBC's well.

## 2014-06-06 ENCOUNTER — Encounter (HOSPITAL_COMMUNITY): Payer: Medicare Other

## 2014-06-06 LAB — TYPE AND SCREEN
ABO/RH(D): A NEG
Antibody Screen: NEGATIVE
Unit division: 0
Unit division: 0

## 2014-06-07 ENCOUNTER — Ambulatory Visit (HOSPITAL_COMMUNITY)
Admission: RE | Admit: 2014-06-07 | Discharge: 2014-06-07 | Disposition: A | Discharge status: Home / Self Care | Payer: Medicare Other | Source: Ambulatory Visit | Attending: Oncology | Admitting: Oncology

## 2014-06-07 ENCOUNTER — Ambulatory Visit (HOSPITAL_COMMUNITY): Payer: Medicare Other | Admitting: Physical Therapy

## 2014-06-07 DIAGNOSIS — R2689 Other abnormalities of gait and mobility: Secondary | ICD-10-CM

## 2014-06-07 DIAGNOSIS — R262 Difficulty in walking, not elsewhere classified: Secondary | ICD-10-CM

## 2014-06-07 DIAGNOSIS — R29898 Other symptoms and signs involving the musculoskeletal system: Secondary | ICD-10-CM

## 2014-06-07 DIAGNOSIS — IMO0001 Reserved for inherently not codable concepts without codable children: Secondary | ICD-10-CM | POA: Diagnosis not present

## 2014-06-07 NOTE — Progress Notes (Signed)
Physical Therapy Treatment Patient Details  Name: [name] MRN: [id number] Date of Birth: [birth year]  Today's Date: 06/07/2014 Time: 1515-1600 PT Time Calculation (min): 45 min    Charges: TE 1515-1600 Visit#: 9 of 12  Re-eval: 06/08/14 Assessment Diagnosis: difficulty walking  Next MD Visit: Blood work every Monday Prior Therapy: 2014 for back   Authorization: medicare  Authorization Time Period:    Authorization Visit#: 9 of 10   Subjective: Symptoms/Limitations Symptoms: Patient states feeling much more energetic sineon due to blood transfusions. Patinet states he was cuttign the grass at his home ealrier today. patient reports trying to get a hearing aid becaudse "my head is clogged up"  Pain Assessment Currently in Pain?: No/denies  Exercise/Treatments Stretches Hip Flexor Stretch: 4 reps;20 seconds;Limitations Hip Flexor Stretch Limitations: to 12" Gastroc Stretch: 20 seconds;4 reps;Limitations Gastroc Stretch Limitations: wedge  Aerobic Stationary Bike: nustep 10' level 4 interval training for activity tolerance Standing Heel Raises: 20 reps Heel Raises Limitations: toeraises 20reps Forward Lunges: 15 reps Forward Lunges Limitations: to floor Functional Squat: 20 reps Other Standing Knee Exercises: rows, T's, and Y's with Green T-band 10x each Other Standing Knee Exercises: sumo walk in // bars with green T-band Supine Bridges: 20 reps Other Supine Knee Exercises: Bent knee raises 10x  Physical Therapy Assessment and Plan PT Assessment and Plan Clinical Impression Statement: Patient completed session withtou requiring any rest breaks. Patient displays limited glut strength resultign in limited ability to squat and ibability to perform bridging exercises through full ROM. no pain noted throguhout session. patient displays improved ability to maintain erect posture.  PT Plan: Concentrate on improving standing posture via postural three exerercise with  theraband  as well as core and LE strengthening to progress sit to stand and stair mobility.  review mat activities/stretches.    Goals PT Short Term Goals PT Short Term Goal 1: Pt to be able to stand for 10 minutes without increased pain to be able to make a sandwich for a meal PT Short Term Goal 1 - Progress: Progressing toward goal PT Short Term Goal 2: Pt to be able to walk for 15 minutes for better health habits.  PT Short Term Goal 2 - Progress: Progressing toward goal PT Short Term Goal 3: Pt to walk with 20 degree forward flexion  PT Short Term Goal 3 - Progress: Progressing toward goal PT Long Term Goals PT Long Term Goal 1: I in advance HEP PT Long Term Goal 2: Pt to be able to stand for 20 minutes to socialize PT Long Term Goal 2 - Progress: Progressing toward goal Long Term Goal 3: Pt to be comfortable walking inside with a cane Long Term Goal 3 Progress: Progressing toward goal Long Term Goal 4: Patient to be able to walk with his walker for 30 minutes at a time Long Term Goal 4 Progress: Progressing toward goal PT Long Term Goal 5: forward bent posture to be decreased to only 10 degree forward bend  Long Term Goal 5 Progress: Progressing toward goal PT Long Term Goal 6: Pain at the most to be a 3/10 80% of the time = Goal met PT Long Term Goal 7: no falls in the past two weeks = Goal met  Problem List Patient Active Problem List   Diagnosis Date Noted  . Weakness of both legs 05/09/2014  . Poor balance 05/09/2014  . Difficulty in walking(719.7) 05/09/2014  . Chest pain 03/23/2014  . CHF (congestive heart failure) 02/12/2014  . Exertional  dyspnea 02/12/2014  . Chest pain of uncertain etiology 94/83/4758  . Diabetes 01/01/2014  . Chronic kidney disease (CKD) stage G3b/A2, moderately decreased glomerular filtration rate (GFR) between 30-44 mL/min/1.73 square meter and albuminuria creatinine ratio between 30-299 mg/g 12/14/2013  . NSTEMI (non-ST elevated myocardial  infarction) 11/28/2013  . HCAP (healthcare-associated pneumonia) 11/27/2013  . Community acquired pneumonia 11/27/2013  . Acute on chronic systolic heart failure 30/74/6002  . Other pancytopenia 01/08/2013  . Non-ST elevation MI (NSTEMI) 01/06/2013  . High output heart failure 10/25/2012  . Myelodysplastic syndrome with 5 q minus 10/17/2012  . History of colonic polyps 02/28/2012  . Anemia 01/10/2012  . Angina effort 01/06/2012  . AVM (arteriovenous malformation) of colon without hemorrhage 01/04/2012  . GI bleeding 10/09/2011  . Long term current use of anticoagulant 12/22/2010  . CAROTID ARTERY STENOSIS 10/16/2010  . DYSLIPIDEMIA 04/11/2009  . CAD 04/11/2009  . Atrial fibrillation 04/11/2009  . PVD 04/11/2009  . OSTEOARTHRITIS 04/11/2009    PT - End of Session Activity Tolerance: Patient tolerated treatment well  GP    [name] 06/07/2014, 4:15 PM

## 2014-06-09 NOTE — Progress Notes (Signed)
Caleb Bogus, MD [place] [address]  Myelodysplastic syndrome with 5 q minus - Plan: CBC with Differential, Comprehensive metabolic panel  CURRENT THERAPY: Observation with support of Hgb via blood transfusions to maintain a Hgb of 9 g/dL or greater due to cardiac history. Last transfusion was on 04/23/2014. Revlimid on hold due to worsening renal function  INTERVAL HISTORY: [name] 80 y.o. male returns for  regular  visit for followup of 5Q- MDS, with Revlimid on hold due to progressively worse renal function on 03/07/2014.    Myelodysplastic syndrome with 5 q minus   09/05/2012 Bone Marrow Biopsy 5 q- MDS   09/19/2012 - 12/15/2012 Chemotherapy Approximate start date of Revlimid 5 mg 21 days on and 7 days off   12/16/2012 - 07/18/2013 Chemotherapy Dose increased to 10 mg Revlimid 21 days on and 7 days off   07/19/2013 - 03/07/2014 Chemotherapy Dose increased to 10 mg daily and then altered a number of times due to hospitalizations.   03/07/2014 Adverse Reaction Worsening renal function   03/07/2014 Treatment Plan Change Revlimid discontinued    I personally reviewed and went over laboratory results with the patient.  The results are noted within this dictation.  He notes 1 episode of SOB prior to his most recent PRBC transfusion.  He reports that he lost his house and have a walk around the house. This additional exertion causing below more short of breath than usual. Since his blood transfusion last week, he reports a shortness breath is back to baseline.  He notes occasions in your regular heartbeat. He does have atrial fibrillation. I listened to him today and he does have an irregularly irregular heartbeat. He is on medications for this for rate control. His pulse today is within normal limits and is not excessive.  He is doing physical therapy and he is very pleased with this. He is increasing his mobility and increasing his strength. He  requests that he stay with physical therapy as long as possible and I am in agreement with this. His goal is to be free of his rolling walker.  He has no interest in restarting Revlimid and I agree with this completely.  Hematologically, the patient denies any complaints and ROS questioning is negative.   Past Medical History  Diagnosis Date  . Essential hypertension, benign   . COPD (chronic obstructive pulmonary disease)   . Coronary atherosclerosis of native coronary artery     Multivessel status post CABG 1996  . Arthritis   . Atrial fibrillation     Coumadin stopped 12/2011 due to anemia  . Dysphagia   . Anemia     Requiring blood transfusions  . Carotid stenosis     Dr Scot Dock   . Diabetes mellitus, type 2   . Mixed hyperlipidemia     Statin intolerant  . Hematuria     Felt related to Foley insertion  . Cardiomyopathy     LVEF 45-50%  . Melena     EGD & Colonoscopy 09/2011 revealed gastritis, erosions, scars, diverticula, 8 colon polyps (largest 1.6cm, not removed 2/2 anticoagulation), small AVM in transverse colon  . History of tobacco abuse     Quit 1992  . Anxiety   . GERD (gastroesophageal reflux disease)   . Myocardial infarction   . Thrombocytopenia   . Myelodysplastic syndrome with 5 q minus 10/17/2012    On Revlimid 5 mg daily 21 days on and 7 days off  .  Aortic stenosis     Mild  . CKD (chronic kidney disease) stage 3, GFR 30-59 ml/min   . Pneumonia 11/2013    history of    has DYSLIPIDEMIA; CAD; Atrial fibrillation; PVD; OSTEOARTHRITIS; CAROTID ARTERY STENOSIS; Long term current use of anticoagulant; GI bleeding; AVM (arteriovenous malformation) of colon without hemorrhage; Angina effort; Anemia; History of colonic polyps; Myelodysplastic syndrome with 5 q minus; High output heart failure; Non-ST elevation MI (NSTEMI); Acute on chronic systolic heart failure; Other pancytopenia; HCAP (healthcare-associated pneumonia); Community acquired pneumonia; NSTEMI  (non-ST elevated myocardial infarction); Chronic kidney disease (CKD) stage G3b/A2, moderately decreased glomerular filtration rate (GFR) between 30-44 mL/min/1.73 square meter and albuminuria creatinine ratio between 30-299 mg/g; Diabetes; CHF (congestive heart failure); Exertional dyspnea; Chest pain of uncertain etiology; Chest pain; Weakness of both legs; Poor balance; and Difficulty in walking(719.7) on his problem list.     is allergic to statins and tape.  Mr. Skillin does not currently have medications on file.  Past Surgical History  Procedure Laterality Date  . Knee surgery  1960    Right knee cartilage  . Rotator cuff repair  1990    right   . Inguinal hernia repair  2000 and 2010    left  . Cholecystectomy  05/2010    Lap chole with biologic mesh repair/reinforcement by  Dr Rise Patience  . Back surgery  02/2006    ruptured disk,  post op diskitis infection requiring prolonged hospital stay and  surgical I & D  . Tonsillectomy    . Esophagogastroduodenoscopy  10/08/2011    Procedure: ESOPHAGOGASTRODUODENOSCOPY (EGD);  Surgeon: [name], MD;  Location: [facility];  Service: Endoscopy;  Laterality: N/A;  . Colonoscopy  10/08/2011    Procedure: COLONOSCOPY;  Surgeon: [name], MD;  Location: [facility];  Service: Endoscopy;  Laterality: N/A;  . Cataract extraction, bilateral    . Peg placement  07/2006    placed due to prolonged infection, poor po intake, malnutrition during several week hospital stay resulting from ruptured disc that became infected following surgery  . Colonoscopy  01/14/2012    Procedure: COLONOSCOPY;  Surgeon: [name], MD;  Location: [facility];  Service: Endoscopy;  Laterality: N/A;  945  . Coronary artery bypass graft  1996    CABG x 4  . Ventral hernia repair    . Colonoscopy  04/20/2012    Procedure: COLONOSCOPY;  Surgeon: [name], MD;  Location: [facility];  Service: Endoscopy;  Laterality: N/A;  1030  . Pr vein  bypass graft,aorto-fem-pop  1996  . Spine surgery    . Joint replacement  1960    Right Knee  . Lymph node biopsy  08/15/2012    Procedure: LYMPH NODE BIOPSY;  Surgeon: Scherry Ran, MD;  Location: [facility];  Service: General;  Laterality: Left;  Cervical Lymph Node Bx in Minor Room  . Bone marrow biopsy    . Bone marrow aspiration      Denies any headaches, dizziness, double vision, fevers, chills, night sweats, nausea, vomiting, diarrhea, constipation, chest pain, heart palpitations, shortness of breath, blood in stool, black tarry stool, urinary pain, urinary burning, urinary frequency, hematuria.   PHYSICAL EXAMINATION  ECOG PERFORMANCE STATUS: 1 - Symptomatic but completely ambulatory  Filed Vitals:   06/10/14 0900  BP: 136/60  Pulse: 70  Temp: 97.5 F (36.4 C)  Resp: 18    GENERAL:alert, no distress, well nourished, well developed, comfortable, cooperative and  smiling SKIN: skin color, texture, turgor are normal, no rashes or significant lesions HEAD: Normocephalic, No masses, lesions, tenderness or abnormalities EYES: normal, PERRLA, EOMI, Conjunctiva are pink and non-injected EARS: External ears normal OROPHARYNX:lips, buccal mucosa, and tongue normal and mucous membranes are moist  NECK: supple, trachea midline LYMPH:  not examined BREAST:not examined LUNGS: not examined HEART: irregularly irregular ABDOMEN:abdomen soft and normal bowel sounds BACK: Back symmetric, no curvature. EXTREMITIES:less then 2 second capillary refill, no joint deformities, effusion, or inflammation, no skin discoloration, no clubbing, no cyanosis  NEURO: alert & oriented x 3 with fluent speech, no focal motor/sensory deficits, gait normal with the use of a rolling walker to maintain stability   LABORATORY DATA: CBC    Component Value Date/Time   WBC 3.5* 06/04/2014 0931   RBC 2.46* 06/04/2014 0931   RBC 3.20* 03/25/2014 0852   HGB 7.9* 06/04/2014 0931   HCT 22.6* 06/04/2014 0931   PLT  109* 06/04/2014 0931   MCV 91.9 06/04/2014 0931   MCH 32.1 06/04/2014 0931   MCHC 35.0 06/04/2014 0931   RDW 20.2* 06/04/2014 0931   LYMPHSABS 1.6 03/25/2014 0512   MONOABS 0.1 03/25/2014 0512   EOSABS 0.2 03/25/2014 0512   BASOSABS 0.0 03/25/2014 0512    ASSESSMENT:  1. 5Q- MDS, Revlimid on hold due to progressive renal failure and co-morbidities.  2. Renal failure  3. Chronic renal disease, grade 3B  4. Vitamin B12 deficiency, intrinsic factor antibody negative (03/26/2014).  Patient Active Problem List   Diagnosis Date Noted  . Weakness of both legs 05/09/2014  . Poor balance 05/09/2014  . Difficulty in walking(719.7) 05/09/2014  . Chest pain 03/23/2014  . CHF (congestive heart failure) 02/12/2014  . Exertional dyspnea 02/12/2014  . Chest pain of uncertain etiology 31/54/0086  . Diabetes 01/01/2014  . Chronic kidney disease (CKD) stage G3b/A2, moderately decreased glomerular filtration rate (GFR) between 30-44 mL/min/1.73 square meter and albuminuria creatinine ratio between 30-299 mg/g 12/14/2013  . NSTEMI (non-ST elevated myocardial infarction) 11/28/2013  . HCAP (healthcare-associated pneumonia) 11/27/2013  . Community acquired pneumonia 11/27/2013  . Acute on chronic systolic heart failure 76/19/5093  . Other pancytopenia 01/08/2013  . Non-ST elevation MI (NSTEMI) 01/06/2013  . High output heart failure 10/25/2012  . Myelodysplastic syndrome with 5 q minus 10/17/2012  . History of colonic polyps 02/28/2012  . Anemia 01/10/2012  . Angina effort 01/06/2012  . AVM (arteriovenous malformation) of colon without hemorrhage 01/04/2012  . GI bleeding 10/09/2011  . Long term current use of anticoagulant 12/22/2010  . CAROTID ARTERY STENOSIS 10/16/2010  . DYSLIPIDEMIA 04/11/2009  . CAD 04/11/2009  . Atrial fibrillation 04/11/2009  . PVD 04/11/2009  . OSTEOARTHRITIS 04/11/2009     PLAN:  1. I personally reviewed and went over laboratory results with the patient. The results are noted  within this dictation.  2. Labs today: CBC diff, CMET, B12  3. Labs weekly: CBC diff  4. Will administer PRBCs to maintain a Hgb of 9 g/dL due to cardiac history  5. Labs in 6 weeks: CBC diff, CMET  6. Return in 6 weeks for follow-up    THERAPY PLAN:  We will continue to hold Revlimid due to renal function and maintain his Hgb at 9 g/dL or greater due to cardiac history with PRBCs. He will likely succumb to one of his other co-morbidites and less likely from MDS. As a result, we desire to treat him conservatively due to the risk associated with Revlimid.   All questions  were answered. The patient knows to call the clinic with any problems, questions or concerns. We can certainly see the patient much sooner if necessary.  Patient and plan discussed with Dr. Farrel Gobble and he is in agreement with the aforementioned.   KEFALAS,THOMAS 06/10/2014

## 2014-06-10 ENCOUNTER — Encounter (HOSPITAL_COMMUNITY): Payer: Medicare Other

## 2014-06-10 ENCOUNTER — Encounter (HOSPITAL_COMMUNITY): Payer: Medicare Other | Attending: Oncology

## 2014-06-10 ENCOUNTER — Encounter (HOSPITAL_BASED_OUTPATIENT_CLINIC_OR_DEPARTMENT_OTHER): Payer: Medicare Other | Admitting: Oncology

## 2014-06-10 ENCOUNTER — Encounter (HOSPITAL_COMMUNITY): Payer: Self-pay | Admitting: Oncology

## 2014-06-10 ENCOUNTER — Ambulatory Visit (HOSPITAL_COMMUNITY)
Admission: RE | Admit: 2014-06-10 | Discharge: 2014-06-10 | Disposition: A | Discharge status: Home / Self Care | Payer: Medicare Other | Source: Ambulatory Visit | Attending: Oncology | Admitting: Oncology

## 2014-06-10 VITALS — BP 136/60 | HR 70 | Temp 97.5°F | Resp 18 | Wt 191.4 lb

## 2014-06-10 DIAGNOSIS — D46C Myelodysplastic syndrome with isolated del(5q) chromosomal abnormality: Secondary | ICD-10-CM

## 2014-06-10 DIAGNOSIS — IMO0001 Reserved for inherently not codable concepts without codable children: Secondary | ICD-10-CM | POA: Diagnosis not present

## 2014-06-10 DIAGNOSIS — D518 Other vitamin B12 deficiency anemias: Secondary | ICD-10-CM | POA: Insufficient documentation

## 2014-06-10 LAB — COMPREHENSIVE METABOLIC PANEL
ALBUMIN: 4 g/dL (ref 3.5–5.2)
ALK PHOS: 75 U/L (ref 39–117)
ALT: 12 U/L (ref 0–53)
AST: 20 U/L (ref 0–37)
Anion gap: 13 (ref 5–15)
BILIRUBIN TOTAL: 0.8 mg/dL (ref 0.3–1.2)
BUN: 22 mg/dL (ref 6–23)
CHLORIDE: 105 meq/L (ref 96–112)
CO2: 24 mEq/L (ref 19–32)
CREATININE: 1.42 mg/dL — AB (ref 0.50–1.35)
Calcium: 9.4 mg/dL (ref 8.4–10.5)
GFR calc Af Amer: 52 mL/min — ABNORMAL LOW (ref >90)
GFR calc non Af Amer: 45 mL/min — ABNORMAL LOW (ref >90)
Glucose, Bld: 181 mg/dL — ABNORMAL HIGH (ref 70–99)
POTASSIUM: 4.2 meq/L (ref 3.7–5.3)
SODIUM: 142 meq/L (ref 137–147)
Total Protein: 7.5 g/dL (ref 6.0–8.3)

## 2014-06-10 LAB — CBC
HEMATOCRIT: 28.2 % — AB (ref 39.0–52.0)
Hemoglobin: 9.3 g/dL — ABNORMAL LOW (ref 13.0–17.0)
MCH: 29.2 pg (ref 26.0–34.0)
MCHC: 33 g/dL (ref 30.0–36.0)
MCV: 88.4 fL (ref 78.0–100.0)
Platelets: 99 10*3/uL — ABNORMAL LOW (ref 150–400)
RBC: 3.19 MIL/uL — ABNORMAL LOW (ref 4.22–5.81)
RDW: 21.6 % — AB (ref 11.5–15.5)
WBC: 3 10*3/uL — AB (ref 4.0–10.5)

## 2014-06-10 NOTE — Progress Notes (Addendum)
Physical Therapy Re-Evaluation  Patient Details  Name: [name] MRN: [id number] Date of Birth: 29-Jan-1932  Today's Date: 06/10/2014 Time: 9233-0076 PT Time Calculation (min): 45 min    TE 1605-1620, MMT/ROM x1          Visit#: 10 of 24  Re-eval: 07/08/14 Assessment Diagnosis: difficulty walking   Authorization: medicare     Past Medical History:  Past Medical History  Diagnosis Date  . Essential hypertension, benign   . COPD (chronic obstructive pulmonary disease)   . Coronary atherosclerosis of native coronary artery     Multivessel status post CABG 1996  . Arthritis   . Atrial fibrillation     Coumadin stopped 12/2011 due to anemia  . Dysphagia   . Anemia     Requiring blood transfusions  . Carotid stenosis     Dr Scot Dock   . Diabetes mellitus, type 2   . Mixed hyperlipidemia     Statin intolerant  . Hematuria     Felt related to Foley insertion  . Cardiomyopathy     LVEF 45-50%  . Melena     EGD & Colonoscopy 09/2011 revealed gastritis, erosions, scars, diverticula, 8 colon polyps (largest 1.6cm, not removed 2/2 anticoagulation), small AVM in transverse colon  . History of tobacco abuse     Quit 1992  . Anxiety   . GERD (gastroesophageal reflux disease)   . Myocardial infarction   . Thrombocytopenia   . Myelodysplastic syndrome with 5 q minus 10/17/2012    On Revlimid 5 mg daily 21 days on and 7 days off  . Aortic stenosis     Mild  . CKD (chronic kidney disease) stage 3, GFR 30-59 ml/min   . Pneumonia 11/2013    history of   Past Surgical History:  Past Surgical History  Procedure Laterality Date  . Knee surgery  1960    Right knee cartilage  . Rotator cuff repair  1990    right   . Inguinal hernia repair  2000 and 2010    left  . Cholecystectomy  05/2010    Lap chole with biologic mesh repair/reinforcement by  Dr Rise Patience  . Back surgery  02/2006    ruptured disk,  post op diskitis infection requiring prolonged hospital stay and  surgical I &  D  . Tonsillectomy    . Esophagogastroduodenoscopy  10/08/2011    Procedure: ESOPHAGOGASTRODUODENOSCOPY (EGD);  Surgeon: [name], MD;  [facility];  Service: Endoscopy;  Laterality: N/A;  . Colonoscopy  10/08/2011    Procedure: COLONOSCOPY;  Surgeon: [name], MD;  Location: [facility];  Service: Endoscopy;  Laterality: N/A;  . Cataract extraction, bilateral    . Peg placement  07/2006    placed due to prolonged infection, poor po intake, malnutrition during several week hospital stay resulting from ruptured disc that became infected following surgery  . Colonoscopy  01/14/2012    Procedure: COLONOSCOPY;  Surgeon: [name], MD;  Location: [facility];  Service: Endoscopy;  Laterality: N/A;  945  . Coronary artery bypass graft  1996    CABG x 4  . Ventral hernia repair    . Colonoscopy  04/20/2012    Procedure: COLONOSCOPY;  Surgeon: [name], MD;  Location: [facility];  Service: Endoscopy;  Laterality: N/A;  1030  . Pr vein bypass graft,aorto-fem-pop  1996  . Spine surgery    . Joint replacement  1960    Right Knee  .  Lymph node biopsy  08/15/2012    Procedure: LYMPH NODE BIOPSY;  Surgeon: Scherry Ran, MD;  Location: [facility];  Service: General;  Laterality: Left;  Cervical Lymph Node Bx in Minor Room  . Bone marrow biopsy    . Bone marrow aspiration      Subjective Symptoms/Limitations Symptoms: No complaints of pain.  How long can you walk comfortably?: Pt reports he is able to amb ~20-25 minutes, holding onto RW/shopping cart, during grocery shopping (was 5 minutes)   Sensation/Coordination/Flexibility/Functional Tests Flexibility 90/90: Positive (Lt 76 degrees, Rt 78 degrees) Functional Tests Functional Tests: FACIT-F 128.6 (was 117.6) Functional Tests: VAS distress 3.33, fatigue 3.66, pain 3.33 ((was VAS distress 6.6, fatigue 4.6, pain 6.3))  Assessment RLE Strength Right Hip Flexion: 3+/5 ((was 3/5)) Right Hip  Extension: 2+/5 ((was 2+/5)) Right Hip ABduction: 3+/5 ((was 3/5)) Right Hip ADduction: 4/5 ((was 4/5)) Right Knee Flexion: 4/5 ((was 3/5)) Right Knee Extension: 4/5 ((was 3+/5)) Right Ankle Dorsiflexion: 4/5 ((was 3+/5)) Right Ankle Plantar Flexion:  (4-/5 (was 3-/5)) LLE Strength Left Hip Flexion:  (4-/5 (was 3/5)) Left Hip Extension: 3-/5 ((was 3-/5)) Left Hip ABduction: 3+/5 ((was 3/5)) Left Hip ADduction:  (4+/5 (was 4/5)) Left Knee Flexion: 4/5 ((was 3/5)) Left Knee Extension: 4/5 ((was 5/5)) Left Ankle Dorsiflexion: 4/5 ((was 3+/5)) Left Ankle Plantar Flexion: 4/5 ((was 3-/5))  Exercise/Treatments Mobility/Balance  Static Standing Balance Single Leg Stance - Right Leg: 1 Single Leg Stance - Left Leg: 1 Tandem Stance - Right Leg: 17 Tandem Stance - Left Leg: 21   Stretches Hip Flexor Stretch: 3 reps;30 seconds Hip Flexor Stretch Limitations: to 12" Gastroc Stretch: 3 reps;30 seconds;Limitations Gastroc Stretch Limitations: Slantboard  Physical Therapy Assessment and Plan PT Assessment and Plan Clinical Impression Statement: Re-assessment completed.  Noted improving strength of all LE joints, though limited from normal.  Pt also demonstrates improved functional well being, with improved score on FACIT-F and decreased score on VAS, with improving subjective assessment.  Pt reports improved ability to perform functional mobility skills by 30%, though still reports most difficulty with ambulaiton without use of walker secondary to fatigue and fear of falling.   Currently pt has met 2/3 STG, and is progressing towards STG.  Recommend continued PT 3x/wk for 4 weeks to address remaining deficits and improve functional mobility skills.  Pt will benefit from skilled therapeutic intervention in order to improve on the following deficits: Decreased activity tolerance;Decreased balance;Difficulty walking;Pain;Decreased strength PT Frequency: Min 3X/week PT Duration: 4 weeks PT  Treatment/Interventions: Gait training;Patient/family education;Therapeutic activities;Therapeutic exercise;Balance training PT Plan: Update TUG.  Concentrate on improving standing posture via postural three exerercise with theraband  as well as core and LE strengthening to progress sit to stand and stair mobility.  review mat activities/stretches.    Goals PT Short Term Goals PT Short Term Goal 1: Pt to be able to stand for 10 minutes without increased pain to be able to make a sandwich for a meal PT Short Term Goal 1 - Progress: Met PT Short Term Goal 2: Pt to be able to walk for 15 minutes for better health habits.  PT Short Term Goal 2 - Progress: Met PT Short Term Goal 3: Pt to walk with 20 degree forward flexion  PT Short Term Goal 3 - Progress: Progressing toward goal PT Long Term Goals PT Long Term Goal 2: Pt to be able to stand for 20 minutes to socialize PT Long Term Goal 2 - Progress: Progressing toward goal Long Term Goal  3: Pt to be comfortable walking inside with a cane Long Term Goal 3 Progress: Progressing toward goal Long Term Goal 4: Patient to be able to walk with his walker for 30 minutes at a time Long Term Goal 4 Progress: Progressing toward goal PT Long Term Goal 5: forward bent posture to be decreased to only 10 degree forward bend  Long Term Goal 5 Progress: Progressing toward goal  Problem List Patient Active Problem List   Diagnosis Date Noted  . Weakness of both legs 05/09/2014  . Poor balance 05/09/2014  . Difficulty in walking(719.7) 05/09/2014  . Chest pain 03/23/2014  . CHF (congestive heart failure) 02/12/2014  . Exertional dyspnea 02/12/2014  . Chest pain of uncertain etiology 66/02/3015  . Diabetes 01/01/2014  . Chronic kidney disease (CKD) stage G3b/A2, moderately decreased glomerular filtration rate (GFR) between 30-44 mL/min/1.73 square meter and albuminuria creatinine ratio between 30-299 mg/g 12/14/2013  . NSTEMI (non-ST elevated myocardial  infarction) 11/28/2013  . HCAP (healthcare-associated pneumonia) 11/27/2013  . Community acquired pneumonia 11/27/2013  . Acute on chronic systolic heart failure 10/05/3233  . Other pancytopenia 01/08/2013  . Non-ST elevation MI (NSTEMI) 01/06/2013  . High output heart failure 10/25/2012  . Myelodysplastic syndrome with 5 q minus 10/17/2012  . History of colonic polyps 02/28/2012  . Anemia 01/10/2012  . Angina effort 01/06/2012  . AVM (arteriovenous malformation) of colon without hemorrhage 01/04/2012  . GI bleeding 10/09/2011  . Long term current use of anticoagulant 12/22/2010  . CAROTID ARTERY STENOSIS 10/16/2010  . DYSLIPIDEMIA 04/11/2009  . CAD 04/11/2009  . Atrial fibrillation 04/11/2009  . PVD 04/11/2009  . OSTEOARTHRITIS 04/11/2009    PT - End of Session Activity Tolerance: Patient tolerated treatment well General Behavior During Therapy: [facility] for tasks assessed/performed   Jayro Mcmath 06/10/2014, 5:10 PM  Physician Documentation Your signature is required to indicate approval of the treatment plan as stated above.  Please sign and either send electronically or make a copy of this report for your files and return this physician signed original.   Please mark one 1.__approve of plan  2. ___approve of plan with the following conditions.   ______________________________                                                          _____________________ Physician Signature                                                                                                             Date  ADDENDUM : G codes   Mobility Current CL, Goal CK

## 2014-06-10 NOTE — Progress Notes (Signed)
LABS FOR CBC,CMP

## 2014-06-10 NOTE — Patient Instructions (Signed)
[name] Discharge Instructions  RECOMMENDATIONS MADE BY THE CONSULTANT AND ANY TEST RESULTS WILL BE SENT TO YOUR REFERRING PHYSICIAN.  We will see you weekly for blood work.  You will see the doctor for follow up in 6 weeks.   Thank you for choosing [facility] to provide your oncology and hematology care.  To afford each patient quality time with our providers, please arrive at least 15 minutes before your scheduled appointment time.  With your help, our goal is to use those 15 minutes to complete the necessary work-up to ensure our physicians have the information they need to help with your evaluation and healthcare recommendations.    Effective January 1st, 2014, we ask that you re-schedule your appointment with our physicians should you arrive 10 or more minutes late for your appointment.  We strive to give you quality time with our providers, and arriving late affects you and other patients whose appointments are after yours.    Again, thank you for choosing [facility].  Our hope is that these requests will decrease the amount of time that you wait before being seen by our physicians.       _____________________________________________________________  Should you have questions after your visit to [facility], please contact our office at [phone] between the hours of 8:30 a.m. and 4:30 p.m.  Voicemails left after 4:30 p.m. will not be returned until the following business day.  For prescription refill requests, have your pharmacy contact our office with your prescription refill request.    _______________________________________________________________  We hope that we have given you very good care.  You may receive a patient satisfaction survey in the mail, please complete it and return it as soon as possible.  We value your feedback!  _______________________________________________________________  Have you asked  about our STAR program?  STAR stands for Survivorship Training and Rehabilitation, and this is a nationally recognized cancer care program that focuses on survivorship and rehabilitation.  Cancer and cancer treatments may cause problems, such as, pain, making you feel tired and keeping you from doing the things that you need or want to do. Cancer rehabilitation can help. Our goal is to reduce these troubling effects and help you have the best quality of life possible.  You may receive a survey from a nurse that asks questions about your current state of health.  Based on the survey results, all eligible patients will be referred to the [facility] program for an evaluation so we can better serve you!  A frequently asked questions sheet is available upon request.

## 2014-06-11 ENCOUNTER — Telehealth (HOSPITAL_COMMUNITY): Payer: Self-pay

## 2014-06-11 NOTE — Telephone Encounter (Signed)
Patient and wife notified of current lab results.  No blood ordered at present.

## 2014-06-12 ENCOUNTER — Ambulatory Visit: Payer: Medicare Other | Admitting: Vascular Surgery

## 2014-06-12 ENCOUNTER — Other Ambulatory Visit (HOSPITAL_COMMUNITY): Payer: Medicare Other

## 2014-06-14 ENCOUNTER — Ambulatory Visit (HOSPITAL_COMMUNITY)
Admission: RE | Admit: 2014-06-14 | Payer: Medicare Other | Source: Ambulatory Visit | Attending: Oncology | Admitting: Oncology

## 2014-06-17 ENCOUNTER — Encounter (HOSPITAL_BASED_OUTPATIENT_CLINIC_OR_DEPARTMENT_OTHER): Payer: Medicare Other

## 2014-06-17 ENCOUNTER — Ambulatory Visit (HOSPITAL_COMMUNITY)
Admission: RE | Admit: 2014-06-17 | Discharge: 2014-06-17 | Disposition: A | Discharge status: Home / Self Care | Payer: Medicare Other | Source: Ambulatory Visit | Attending: Oncology | Admitting: Oncology

## 2014-06-17 ENCOUNTER — Other Ambulatory Visit (HOSPITAL_COMMUNITY): Payer: Self-pay | Admitting: Oncology

## 2014-06-17 DIAGNOSIS — IMO0001 Reserved for inherently not codable concepts without codable children: Secondary | ICD-10-CM | POA: Diagnosis not present

## 2014-06-17 DIAGNOSIS — D46C Myelodysplastic syndrome with isolated del(5q) chromosomal abnormality: Secondary | ICD-10-CM

## 2014-06-17 LAB — CBC WITH DIFFERENTIAL/PLATELET
BASOS PCT: 2 % — AB (ref 0–1)
Basophils Absolute: 0.1 10*3/uL (ref 0.0–0.1)
EOS ABS: 0.3 10*3/uL (ref 0.0–0.7)
EOS PCT: 6 % — AB (ref 0–5)
HCT: 25.1 % — ABNORMAL LOW (ref 39.0–52.0)
HEMOGLOBIN: 8.2 g/dL — AB (ref 13.0–17.0)
Lymphocytes Relative: 39 % (ref 12–46)
Lymphs Abs: 1.6 10*3/uL (ref 0.7–4.0)
MCH: 29.2 pg (ref 26.0–34.0)
MCHC: 32.7 g/dL (ref 30.0–36.0)
MCV: 89.3 fL (ref 78.0–100.0)
MONO ABS: 0.2 10*3/uL (ref 0.1–1.0)
MONOS PCT: 6 % (ref 3–12)
NEUTROS PCT: 47 % (ref 43–77)
Neutro Abs: 1.9 10*3/uL (ref 1.7–7.7)
Platelets: 105 10*3/uL — ABNORMAL LOW (ref 150–400)
RBC: 2.81 MIL/uL — ABNORMAL LOW (ref 4.22–5.81)
RDW: 22.6 % — ABNORMAL HIGH (ref 11.5–15.5)
WBC: 4 10*3/uL (ref 4.0–10.5)

## 2014-06-17 LAB — PREPARE RBC (CROSSMATCH)

## 2014-06-17 NOTE — Progress Notes (Signed)
Physical Therapy Treatment Patient Details  Name: [name] MRN: [id number] Date of Birth: Sep 20, 1932  Today's Date: 06/17/2014 Time: 8563-1497 PT Time Calculation (min): 48 min  Visit#: 11 of 24  Re-eval: 07/08/14 Authorization: medicare  Authorization Visit#: 11 of 20  Charges:  therex 45  Subjective: Symptoms/Limitations Symptoms: Pt states he returned to [place] today for bloodwork. States his HgB was 8.5 and he has to get 2Units of RBC tomorrow morning.   Currently without pain or complaints. Pain Assessment Currently in Pain?: No/denies   Exercise/Treatments Stretches Hip Flexor Stretch: 3 reps;30 seconds Hip Flexor Stretch Limitations: to 12" Gastroc Stretch: 3 reps;30 seconds;Limitations Gastroc Stretch Limitations: Slantboard Aerobic Stationary Bike: nustep 10' level 4 interval training for activity tolerance Standing Heel Raises: 20 reps Heel Raises Limitations: toeraises 20reps Forward Lunges: 15 reps Forward Lunges Limitations: to floor Functional Squat: 20 reps     Physical Therapy Assessment and Plan PT Assessment and Plan Clinical Impression Statement: Continued to progress stability and overall strength.  Pt completed deeper forward lunges using bilateral UE's.  Pt with improving posture with ambulation. PT Plan: Update TUG.  Concentrate on improving standing posture via postural three exerercise with theraband  as well as core and LE strengthening to progress sit to stand and stair mobility.  review mat activities/stretches.     Problem List Patient Active Problem List   Diagnosis Date Noted  . Weakness of both legs 05/09/2014  . Poor balance 05/09/2014  . Difficulty in walking(719.7) 05/09/2014  . Chest pain 03/23/2014  . CHF (congestive heart failure) 02/12/2014  . Exertional dyspnea 02/12/2014  . Chest pain of uncertain etiology 02/63/7858  . Diabetes 01/01/2014  . Chronic kidney disease (CKD) stage G3b/A2, moderately decreased glomerular  filtration rate (GFR) between 30-44 mL/min/1.73 square meter and albuminuria creatinine ratio between 30-299 mg/g 12/14/2013  . NSTEMI (non-ST elevated myocardial infarction) 11/28/2013  . HCAP (healthcare-associated pneumonia) 11/27/2013  . Community acquired pneumonia 11/27/2013  . Acute on chronic systolic heart failure 85/10/7739  . Other pancytopenia 01/08/2013  . Non-ST elevation MI (NSTEMI) 01/06/2013  . High output heart failure 10/25/2012  . Myelodysplastic syndrome with 5 q minus 10/17/2012  . History of colonic polyps 02/28/2012  . Anemia 01/10/2012  . Angina effort 01/06/2012  . AVM (arteriovenous malformation) of colon without hemorrhage 01/04/2012  . GI bleeding 10/09/2011  . Long term current use of anticoagulant 12/22/2010  . CAROTID ARTERY STENOSIS 10/16/2010  . DYSLIPIDEMIA 04/11/2009  . CAD 04/11/2009  . Atrial fibrillation 04/11/2009  . PVD 04/11/2009  . OSTEOARTHRITIS 04/11/2009    PT - End of Session Activity Tolerance: Patient tolerated treatment well General Behavior During Therapy: [facility] for tasks assessed/performed   Teena Irani, PTA/CLT 06/17/2014, 5:50 PM

## 2014-06-17 NOTE — Addendum Note (Signed)
Addended by: Joie Bimler on: 06/17/2014 01:24 PM   Modules accepted: Orders

## 2014-06-17 NOTE — Progress Notes (Signed)
LABS FOR CBCD 

## 2014-06-18 ENCOUNTER — Encounter: Payer: Self-pay | Admitting: Vascular Surgery

## 2014-06-18 ENCOUNTER — Encounter (HOSPITAL_BASED_OUTPATIENT_CLINIC_OR_DEPARTMENT_OTHER): Payer: Medicare Other

## 2014-06-18 ENCOUNTER — Ambulatory Visit (HOSPITAL_COMMUNITY): Payer: Medicare Other | Admitting: Physical Therapy

## 2014-06-18 VITALS — BP 141/44 | HR 57 | Temp 97.7°F | Resp 18

## 2014-06-18 DIAGNOSIS — Z23 Encounter for immunization: Secondary | ICD-10-CM

## 2014-06-18 DIAGNOSIS — D46C Myelodysplastic syndrome with isolated del(5q) chromosomal abnormality: Secondary | ICD-10-CM | POA: Diagnosis not present

## 2014-06-18 MED ORDER — SODIUM CHLORIDE 0.9 % IJ SOLN: 10.0000 mL | INTRAMUSCULAR | Status: DC | PRN

## 2014-06-18 MED ORDER — INFLUENZA VAC SPLIT QUAD 0.5 ML IM SUSY
0.5000 mL | PREFILLED_SYRINGE | Freq: Once | INTRAMUSCULAR | Status: AC
  Administered 2014-06-18: 0.5 mL via INTRAMUSCULAR
  Filled 2014-06-18: qty 0.5

## 2014-06-18 MED ORDER — SODIUM CHLORIDE 0.9 % IJ SOLN: 3.0000 mL | INTRAMUSCULAR | Status: DC | PRN

## 2014-06-18 MED ORDER — DIPHENHYDRAMINE HCL 25 MG PO CAPS
25.0000 mg | ORAL_CAPSULE | Freq: Once | ORAL | Status: AC
  Administered 2014-06-18: 25 mg via ORAL
  Filled 2014-06-18: qty 1

## 2014-06-18 MED ORDER — FUROSEMIDE 10 MG/ML IJ SOLN
40.0000 mg | Freq: Once | INTRAMUSCULAR | Status: AC
  Administered 2014-06-18: 20 mg via INTRAVENOUS
  Filled 2014-06-18: qty 4

## 2014-06-18 MED ORDER — SODIUM CHLORIDE 0.9 % IV SOLN
250.0000 mL | Freq: Once | INTRAVENOUS | Status: AC
  Administered 2014-06-18: 10:00:00 via INTRAVENOUS

## 2014-06-18 MED ORDER — ACETAMINOPHEN 325 MG PO TABS
650.0000 mg | ORAL_TABLET | Freq: Once | ORAL | Status: AC
  Administered 2014-06-18: 650 mg via ORAL
  Filled 2014-06-18: qty 2

## 2014-06-18 MED ORDER — FUROSEMIDE 10 MG/ML IJ SOLN
INTRAMUSCULAR | Status: AC
  Filled 2014-06-18: qty 2

## 2014-06-18 NOTE — Patient Instructions (Signed)
[facility] Discharge Instructions  RECOMMENDATIONS MADE BY THE CONSULTANT AND ANY TEST RESULTS WILL BE SENT TO YOUR REFERRING PHYSICIAN.  You were given 2 units of Packed Red Blood Cells today. Please follow up as scheduled. Please call us for any questions or concerns.    Thank you for choosing [facility] to provide your oncology and hematology care.  To afford each patient quality time with our providers, please arrive at least 15 minutes before your scheduled appointment time.  With your help, our goal is to use those 15 minutes to complete the necessary work-up to ensure our physicians have the information they need to help with your evaluation and healthcare recommendations.    Effective January 1st, 2014, we ask that you re-schedule your appointment with our physicians should you arrive 10 or more minutes late for your appointment.  We strive to give you quality time with our providers, and arriving late affects you and other patients whose appointments are after yours.    Again, thank you for choosing [facility].  Our hope is that these requests will decrease the amount of time that you wait before being seen by our physicians.       _____________________________________________________________  Should you have questions after your visit to [facility], please contact our office at [phone] between the hours of 8:30 a.m. and 4:30 p.m.  Voicemails left after 4:30 p.m. will not be returned until the following business day.  For prescription refill requests, have your pharmacy contact our office with your prescription refill request.    _______________________________________________________________  We hope that we have given you very good care.  You may receive a patient satisfaction survey in the mail, please complete it and return it as soon as possible.  We value your  feedback!  _______________________________________________________________  Have you asked about our STAR program?  STAR stands for Survivorship Training and Rehabilitation, and this is a nationally recognized cancer care program that focuses on survivorship and rehabilitation.  Cancer and cancer treatments may cause problems, such as, pain, making you feel tired and keeping you from doing the things that you need or want to do. Cancer rehabilitation can help. Our goal is to reduce these troubling effects and help you have the best quality of life possible.  You may receive a survey from a nurse that asks questions about your current state of health.  Based on the survey results, all eligible patients will be referred to the [facility] program for an evaluation so we can better serve you!  A frequently asked questions sheet is available upon request.

## 2014-06-18 NOTE — Progress Notes (Signed)
Patient tolerated 2 units of PRBC's well. Taken to care in wheelchair.

## 2014-06-19 ENCOUNTER — Ambulatory Visit: Payer: Medicare Other | Admitting: Vascular Surgery

## 2014-06-19 ENCOUNTER — Other Ambulatory Visit (HOSPITAL_COMMUNITY): Payer: Medicare Other

## 2014-06-19 LAB — TYPE AND SCREEN
ABO/RH(D): A NEG
ANTIBODY SCREEN: NEGATIVE
UNIT DIVISION: 0
Unit division: 0

## 2014-06-20 ENCOUNTER — Inpatient Hospital Stay (HOSPITAL_COMMUNITY): Admission: RE | Admit: 2014-06-20 | Payer: Medicare Other | Source: Ambulatory Visit | Admitting: Physical Therapy

## 2014-06-20 ENCOUNTER — Ambulatory Visit (HOSPITAL_COMMUNITY)
Admission: RE | Admit: 2014-06-20 | Discharge: 2014-06-20 | Disposition: A | Discharge status: Home / Self Care | Payer: Medicare Other | Source: Ambulatory Visit | Attending: Pulmonary Disease | Admitting: Pulmonary Disease

## 2014-06-20 DIAGNOSIS — IMO0001 Reserved for inherently not codable concepts without codable children: Secondary | ICD-10-CM | POA: Diagnosis not present

## 2014-06-20 NOTE — Progress Notes (Signed)
Physical Therapy Treatment Patient Details  Name: [name] MRN: [id number] Date of Birth: 21-May-1932  Today's Date: 06/20/2014 Time: 0800-0846 PT Time Calculation (min): 25 min TE 5329-9242, TA 6834-1962  Visit#: 12 of 24  Re-eval: 07/08/14 Assessment Diagnosis: difficulty walking    Subjective: Symptoms/Limitations Symptoms: Pt reports no complaints of pain today.  Pain Assessment Currently in Pain?: No/denies   Exercise/Treatments Mobility/Balance     Timed Up and Go Test TUG: Normal TUG (with RW) Normal TUG (seconds): 24  Stretches Active Hamstring Stretch: 3 reps;30 seconds;Limitations Active Hamstring Stretch Limitations: 14" Box Hip Flexor Stretch: 3 reps;30 seconds Hip Flexor Stretch Limitations: 14" Box Gastroc Stretch: 3 reps;30 seconds;Limitations Gastroc Stretch Limitations: Slantboard Aerobic Tread Mill: 0.9 mph, 5' Standing Heel Raises: 10 reps (on Airex) Heel Raises Limitations: Toeraises x10 on Airex Gait Training: with SPC 180 feet x2 Seated Long Arc Quad: 3 sets;10 reps;Limitations Long Arc Quad Limitations: 5# Other Seated Knee Exercises: Sit <-> Stand, no UE, min assist from PT, 2x5      Physical Therapy Assessment and Plan PT Assessment and Plan Clinical Impression Statement: Great gait patterning with gait, with only minimal cueing for upright posturing with SPC and moderate cueing on TM for increasing step length and upright posturing.  No LOB with SPC in clinic, and encouraged pt to continue with SPC in the home to increase tolerance.  Sit <-> stand transfers performed without use of UE, though pt did require min assist of PT secondary to fair immediate standing balance and weakness.  Pt will benefit from skilled therapeutic intervention in order to improve on the following deficits: Decreased activity tolerance;Decreased balance;Difficulty walking;Pain;Decreased strength Rehab Potential: Good PT Frequency: Min 3X/week PT  Duration: 4 weeks PT Treatment/Interventions: Gait training;Patient/family education;Therapeutic activities;Therapeutic exercise;Balance training PT Plan: Concentrate on improving standing posture via postural three exerercise with theraband  as well as core and LE strengthening to progress to stair mobility.  review mat activities/stretches.    Goals PT Short Term Goals PT Short Term Goal 3: Pt to walk with 20 degree forward flexion  PT Short Term Goal 3 - Progress: Progressing toward goal PT Long Term Goals Long Term Goal 4: Patient to be able to walk with his walker for 30 minutes at a time Long Term Goal 4 Progress: Progressing toward goal  Problem List Patient Active Problem List   Diagnosis Date Noted  . Weakness of both legs 05/09/2014  . Poor balance 05/09/2014  . Difficulty in walking(719.7) 05/09/2014  . Chest pain 03/23/2014  . CHF (congestive heart failure) 02/12/2014  . Exertional dyspnea 02/12/2014  . Chest pain of uncertain etiology 22/97/9892  . Diabetes 01/01/2014  . Chronic kidney disease (CKD) stage G3b/A2, moderately decreased glomerular filtration rate (GFR) between 30-44 mL/min/1.73 square meter and albuminuria creatinine ratio between 30-299 mg/g 12/14/2013  . NSTEMI (non-ST elevated myocardial infarction) 11/28/2013  . HCAP (healthcare-associated pneumonia) 11/27/2013  . Community acquired pneumonia 11/27/2013  . Acute on chronic systolic heart failure 11/94/1740  . Other pancytopenia 01/08/2013  . Non-ST elevation MI (NSTEMI) 01/06/2013  . High output heart failure 10/25/2012  . Myelodysplastic syndrome with 5 q minus 10/17/2012  . History of colonic polyps 02/28/2012  . Anemia 01/10/2012  . Angina effort 01/06/2012  . AVM (arteriovenous malformation) of colon without hemorrhage 01/04/2012  . GI bleeding 10/09/2011  . Long term current use of anticoagulant 12/22/2010  . CAROTID ARTERY STENOSIS 10/16/2010  . DYSLIPIDEMIA 04/11/2009  . CAD 04/11/2009  .  Atrial fibrillation  04/11/2009  . PVD 04/11/2009  . OSTEOARTHRITIS 04/11/2009    PT - End of Session Equipment Utilized During Treatment: Gait belt Activity Tolerance: Patient tolerated treatment well General Behavior During Therapy: [facility] for tasks assessed/performed   [name] 06/20/2014, 8:50 AM

## 2014-06-24 ENCOUNTER — Encounter (HOSPITAL_BASED_OUTPATIENT_CLINIC_OR_DEPARTMENT_OTHER): Payer: Medicare Other

## 2014-06-24 DIAGNOSIS — IMO0001 Reserved for inherently not codable concepts without codable children: Secondary | ICD-10-CM | POA: Diagnosis not present

## 2014-06-24 DIAGNOSIS — D46C Myelodysplastic syndrome with isolated del(5q) chromosomal abnormality: Secondary | ICD-10-CM

## 2014-06-24 LAB — CBC WITH DIFFERENTIAL/PLATELET
BASOS PCT: 1 % (ref 0–1)
Basophils Absolute: 0 10*3/uL (ref 0.0–0.1)
EOS ABS: 0.2 10*3/uL (ref 0.0–0.7)
Eosinophils Relative: 6 % — ABNORMAL HIGH (ref 0–5)
HCT: 29.3 % — ABNORMAL LOW (ref 39.0–52.0)
Hemoglobin: 9.9 g/dL — ABNORMAL LOW (ref 13.0–17.0)
Lymphocytes Relative: 34 % (ref 12–46)
Lymphs Abs: 1.3 10*3/uL (ref 0.7–4.0)
MCH: 30 pg (ref 26.0–34.0)
MCHC: 33.8 g/dL (ref 30.0–36.0)
MCV: 88.8 fL (ref 78.0–100.0)
MONOS PCT: 5 % (ref 3–12)
Monocytes Absolute: 0.2 10*3/uL (ref 0.1–1.0)
NEUTROS ABS: 2.1 10*3/uL (ref 1.7–7.7)
Neutrophils Relative %: 55 % (ref 43–77)
PLATELETS: 107 10*3/uL — AB (ref 150–400)
RBC: 3.3 MIL/uL — ABNORMAL LOW (ref 4.22–5.81)
RDW: 21.1 % — ABNORMAL HIGH (ref 11.5–15.5)
WBC: 3.8 10*3/uL — ABNORMAL LOW (ref 4.0–10.5)

## 2014-06-24 NOTE — Progress Notes (Signed)
LABS FOR CBCD 

## 2014-06-25 ENCOUNTER — Ambulatory Visit (HOSPITAL_COMMUNITY)
Admission: RE | Admit: 2014-06-25 | Discharge: 2014-06-25 | Disposition: A | Discharge status: Home / Self Care | Payer: Medicare Other | Source: Ambulatory Visit | Attending: Oncology | Admitting: Oncology

## 2014-06-25 DIAGNOSIS — R262 Difficulty in walking, not elsewhere classified: Secondary | ICD-10-CM

## 2014-06-25 DIAGNOSIS — IMO0001 Reserved for inherently not codable concepts without codable children: Secondary | ICD-10-CM | POA: Diagnosis not present

## 2014-06-25 DIAGNOSIS — R2689 Other abnormalities of gait and mobility: Secondary | ICD-10-CM

## 2014-06-25 DIAGNOSIS — R29898 Other symptoms and signs involving the musculoskeletal system: Secondary | ICD-10-CM

## 2014-06-25 NOTE — Progress Notes (Signed)
Physical Therapy Treatment Patient Details  Name: [name] MRN: [id number] Date of Birth: October 19, 1931  Today's Date: 06/25/2014 Time: 1603-1700 PT Time Calculation (min): 57 min   Charges: TE 3614-4315, Gait training 1645-1700 Visit#: 13 of 24  Re-eval: 07/08/14 Assessment Diagnosis: difficulty walking  Next MD Visit: Blood work every Monday Prior Therapy: 2014 for back   Authorization: medicare   Authorization Visit#: 13 of 20   Subjective: Symptoms/Limitations Symptoms: No pain, states he is worrioed about the roof on his house due to the rain.  Pertinent History: back surgery 2007 the patient got an infection and ended up in the hospital for 72 days and the patient did not walk for almost a year.  ; myelodysplastic syndrom (bone marrow does not make blood). Pt started Chemo a year ago last treatement was July.  Pt needs blood transfusions 2 pints a week for the past 7 months. Patient Stated Goals: to be able to walk with a cane.   Pain Assessment Currently in Pain?: No/denies  Exercise/Treatments Stretches Active Hamstring Stretch: 3 reps;30 seconds;Limitations Active Hamstring Stretch Limitations: 14" Box Hip Flexor Stretch: 3 reps;30 seconds Hip Flexor Stretch Limitations: 14" Box Gastroc Stretch: 3 reps;30 seconds;Limitations Gastroc Stretch Limitations: Slantboard Aerobic Stationary Bike: nustep 15' level 4 steady state Standing Heel Raises: 10 reps (on Airex) Heel Raises Limitations: Toeraises x10 on Airex Forward Lunges: 20 reps Forward Lunges Limitations: to floor Functional Squat: Limitations Functional Squat Limitations: Squat matrix 10x each Rocker Board: 4 minutes;Limitations Rocker Board Limitations: Lt to Rt Other Standing Knee Exercises: Hurdle walk 6"x4, 12"x3, 4x   Physical Therapy Assessment and Plan PT Assessment and Plan Clinical Impression Statement: this session focsed on functional LE strengtheing to decrease reliance on UE during gait.  Patient demosntrated improvign performance with lunging and squatting though patient conitnues to require at least 1 UE support for balance. Session ended on Nu step to increase activity tolerance so patient can  more easily completre multiple activities uin home at one time.  PT Plan: Concentrate on improving standing posture via postural three exerercise with theraband  as well as core and LE strengthening to progress to stair mobility.Progress dynamic balance and LE strenghtieng exercises to decrease reliance on walker.     Goals PT Short Term Goals PT Short Term Goal 3: Pt to walk with 20 degree forward flexion  PT Short Term Goal 3 - Progress: Progressing toward goal PT Long Term Goals PT Long Term Goal 1: I in advance HEP PT Long Term Goal 1 - Progress: Progressing toward goal PT Long Term Goal 2: Pt to be able to stand for 20 minutes to socialize PT Long Term Goal 2 - Progress: Progressing toward goal Long Term Goal 3: Pt to be comfortable walking inside with a cane Long Term Goal 3 Progress: Progressing toward goal Long Term Goal 4: Patient to be able to walk with his walker for 30 minutes at a time Long Term Goal 4 Progress: Progressing toward goal PT Long Term Goal 5: forward bent posture to be decreased to only 10 degree forward bend  Long Term Goal 5 Progress: Progressing toward goal  Problem List Patient Active Problem List   Diagnosis Date Noted  . Weakness of both legs 05/09/2014  . Poor balance 05/09/2014  . Difficulty in walking(719.7) 05/09/2014  . Chest pain 03/23/2014  . CHF (congestive heart failure) 02/12/2014  . Exertional dyspnea 02/12/2014  . Chest pain of uncertain etiology 40/04/6760  . Diabetes 01/01/2014  . Chronic  kidney disease (CKD) stage G3b/A2, moderately decreased glomerular filtration rate (GFR) between 30-44 mL/min/1.73 square meter and albuminuria creatinine ratio between 30-299 mg/g 12/14/2013  . NSTEMI (non-ST elevated myocardial infarction)  11/28/2013  . HCAP (healthcare-associated pneumonia) 11/27/2013  . Community acquired pneumonia 11/27/2013  . Acute on chronic systolic heart failure 02/04/210  . Other pancytopenia 01/08/2013  . Non-ST elevation MI (NSTEMI) 01/06/2013  . High output heart failure 10/25/2012  . Myelodysplastic syndrome with 5 q minus 10/17/2012  . History of colonic polyps 02/28/2012  . Anemia 01/10/2012  . Angina effort 01/06/2012  . AVM (arteriovenous malformation) of colon without hemorrhage 01/04/2012  . GI bleeding 10/09/2011  . Long term current use of anticoagulant 12/22/2010  . CAROTID ARTERY STENOSIS 10/16/2010  . DYSLIPIDEMIA 04/11/2009  . CAD 04/11/2009  . Atrial fibrillation 04/11/2009  . PVD 04/11/2009  . OSTEOARTHRITIS 04/11/2009    PT - End of Session Activity Tolerance: Patient tolerated treatment well General Behavior During Therapy: [facility] for tasks assessed/performed  GP    Vuk Skillern R 06/25/2014, 4:59 PM

## 2014-06-28 ENCOUNTER — Ambulatory Visit (HOSPITAL_COMMUNITY)
Admission: RE | Admit: 2014-06-28 | Discharge: 2014-06-28 | Disposition: A | Discharge status: Home / Self Care | Payer: Medicare Other | Source: Ambulatory Visit | Attending: Oncology | Admitting: Oncology

## 2014-06-28 ENCOUNTER — Ambulatory Visit (HOSPITAL_COMMUNITY): Payer: Medicare Other

## 2014-06-28 DIAGNOSIS — I10 Essential (primary) hypertension: Secondary | ICD-10-CM | POA: Insufficient documentation

## 2014-06-28 DIAGNOSIS — Z9181 History of falling: Secondary | ICD-10-CM | POA: Insufficient documentation

## 2014-06-28 DIAGNOSIS — R29898 Other symptoms and signs involving the musculoskeletal system: Secondary | ICD-10-CM

## 2014-06-28 DIAGNOSIS — Z5189 Encounter for other specified aftercare: Secondary | ICD-10-CM | POA: Diagnosis present

## 2014-06-28 DIAGNOSIS — J449 Chronic obstructive pulmonary disease, unspecified: Secondary | ICD-10-CM | POA: Insufficient documentation

## 2014-06-28 DIAGNOSIS — Z951 Presence of aortocoronary bypass graft: Secondary | ICD-10-CM | POA: Diagnosis not present

## 2014-06-28 DIAGNOSIS — R262 Difficulty in walking, not elsewhere classified: Secondary | ICD-10-CM | POA: Insufficient documentation

## 2014-06-28 DIAGNOSIS — R2689 Other abnormalities of gait and mobility: Secondary | ICD-10-CM

## 2014-06-28 NOTE — Progress Notes (Signed)
Physical Therapy Treatment Patient Details  Name: [name] MRN: [id number] Date of Birth: 06-26-1932  Today's Date: 06/28/2014 Time: 4098-1191 PT Time Calculation (min): 53 min Charge there ex 847-930  Visit#: 14 of 24  Re-eval: 07/08/14    Authorization: medicare   Authorization Visit#: 14 of 20   Subjective: Symptoms/Limitations Symptoms: Pt states he feels very weak today Pain Assessment Currently in Pain?: No/denies      Exercise/Treatments Stationary Bike: nustep L 4 hills x 8:00   Standing Heel Raises: 10 reps Lateral Step Up: Both;10 reps Forward Step Up: Both;10 reps Functional Squat: 10 reps SLS: 3x Other Standing Knee Exercises: Stand at wall good posture with B UE flexion  Other Standing Knee Exercises: t-band scapular retraction, row and extension  Seated Other Seated Knee Exercises: sit to stand 10x (5 Rt/ 5 Lt)      Physical Therapy Assessment and Plan PT Assessment and Plan Clinical Impression Statement: Pt Rt side is significantly weaker than LT.  If therapist is not facilitating exercise pt tends to put all effort on Lt LE. Added postural exercises with pt having good awareness of posture.  Pt request to do nustep at end of session so he can work out with therapist more.  PT Plan: Continue to focus on Rt LE more than Lt.  Work with pt the full 45 minutes then put pt on nustep for cardio on own.     Goals   Progressing  Problem List Patient Active Problem List   Diagnosis Date Noted  . Weakness of both legs 05/09/2014  . Poor balance 05/09/2014  . Difficulty in walking(719.7) 05/09/2014  . Chest pain 03/23/2014  . CHF (congestive heart failure) 02/12/2014  . Exertional dyspnea 02/12/2014  . Chest pain of uncertain etiology 47/82/9562  . Diabetes 01/01/2014  . Chronic kidney disease (CKD) stage G3b/A2, moderately decreased glomerular filtration rate (GFR) between 30-44 mL/min/1.73 square meter and albuminuria creatinine ratio between  30-299 mg/g 12/14/2013  . NSTEMI (non-ST elevated myocardial infarction) 11/28/2013  . HCAP (healthcare-associated pneumonia) 11/27/2013  . Community acquired pneumonia 11/27/2013  . Acute on chronic systolic heart failure 13/04/6577  . Other pancytopenia 01/08/2013  . Non-ST elevation MI (NSTEMI) 01/06/2013  . High output heart failure 10/25/2012  . Myelodysplastic syndrome with 5 q minus 10/17/2012  . History of colonic polyps 02/28/2012  . Anemia 01/10/2012  . Angina effort 01/06/2012  . AVM (arteriovenous malformation) of colon without hemorrhage 01/04/2012  . GI bleeding 10/09/2011  . Long term current use of anticoagulant 12/22/2010  . CAROTID ARTERY STENOSIS 10/16/2010  . DYSLIPIDEMIA 04/11/2009  . CAD 04/11/2009  . Atrial fibrillation 04/11/2009  . PVD 04/11/2009  . OSTEOARTHRITIS 04/11/2009       GP    RUSSELL,CINDY 06/28/2014, 9:44 AM

## 2014-07-01 ENCOUNTER — Ambulatory Visit (HOSPITAL_COMMUNITY): Payer: Medicare Other | Admitting: Physical Therapy

## 2014-07-01 ENCOUNTER — Encounter (HOSPITAL_COMMUNITY): Payer: Medicare Other | Attending: Oncology

## 2014-07-01 ENCOUNTER — Other Ambulatory Visit (HOSPITAL_COMMUNITY): Payer: Self-pay | Admitting: Oncology

## 2014-07-01 DIAGNOSIS — D46C Myelodysplastic syndrome with isolated del(5q) chromosomal abnormality: Secondary | ICD-10-CM | POA: Diagnosis present

## 2014-07-01 LAB — CBC WITH DIFFERENTIAL/PLATELET
BASOS PCT: 1 % (ref 0–1)
Basophils Absolute: 0.1 10*3/uL (ref 0.0–0.1)
EOS ABS: 0.2 10*3/uL (ref 0.0–0.7)
Eosinophils Relative: 7 % — ABNORMAL HIGH (ref 0–5)
HCT: 25 % — ABNORMAL LOW (ref 39.0–52.0)
Hemoglobin: 8.4 g/dL — ABNORMAL LOW (ref 13.0–17.0)
Lymphocytes Relative: 40 % (ref 12–46)
Lymphs Abs: 1.4 10*3/uL (ref 0.7–4.0)
MCH: 29.9 pg (ref 26.0–34.0)
MCHC: 33.6 g/dL (ref 30.0–36.0)
MCV: 89 fL (ref 78.0–100.0)
MONOS PCT: 5 % (ref 3–12)
Monocytes Absolute: 0.2 10*3/uL (ref 0.1–1.0)
NEUTROS ABS: 1.6 10*3/uL — AB (ref 1.7–7.7)
Neutrophils Relative %: 47 % (ref 43–77)
PLATELETS: 111 10*3/uL — AB (ref 150–400)
RBC: 2.81 MIL/uL — ABNORMAL LOW (ref 4.22–5.81)
RDW: 21.7 % — ABNORMAL HIGH (ref 11.5–15.5)
WBC: 3.5 10*3/uL — ABNORMAL LOW (ref 4.0–10.5)

## 2014-07-01 NOTE — Addendum Note (Signed)
Addended by: Mellissa Kohut on: 07/01/2014 12:59 PM   Modules accepted: Orders

## 2014-07-01 NOTE — Progress Notes (Signed)
LABS FOR CBCD 

## 2014-07-02 ENCOUNTER — Encounter: Payer: Self-pay | Admitting: *Deleted

## 2014-07-02 ENCOUNTER — Encounter (HOSPITAL_COMMUNITY): Payer: Medicare Other | Attending: Hematology

## 2014-07-02 ENCOUNTER — Ambulatory Visit (HOSPITAL_COMMUNITY): Payer: Medicare Other | Admitting: Physical Therapy

## 2014-07-02 VITALS — BP 126/48 | HR 58 | Temp 97.4°F | Resp 18

## 2014-07-02 DIAGNOSIS — D46C Myelodysplastic syndrome with isolated del(5q) chromosomal abnormality: Secondary | ICD-10-CM | POA: Diagnosis not present

## 2014-07-02 LAB — PREPARE RBC (CROSSMATCH)

## 2014-07-02 MED ORDER — SODIUM CHLORIDE 0.9 % IJ SOLN: 10.0000 mL | INTRAMUSCULAR | Status: DC | PRN

## 2014-07-02 MED ORDER — DIPHENHYDRAMINE HCL 25 MG PO CAPS
ORAL_CAPSULE | ORAL | Status: AC
  Filled 2014-07-02: qty 1

## 2014-07-02 MED ORDER — FUROSEMIDE 10 MG/ML IJ SOLN
20.0000 mg | Freq: Once | INTRAMUSCULAR | Status: AC
  Administered 2014-07-02: 20 mg via INTRAVENOUS
  Filled 2014-07-02: qty 2

## 2014-07-02 MED ORDER — ACETAMINOPHEN 325 MG PO TABS
ORAL_TABLET | ORAL | Status: AC
  Filled 2014-07-02: qty 2

## 2014-07-02 MED ORDER — SODIUM CHLORIDE 0.9 % IV SOLN
250.0000 mL | Freq: Once | INTRAVENOUS | Status: AC
  Administered 2014-07-02: 10:00:00 via INTRAVENOUS

## 2014-07-02 MED ORDER — ACETAMINOPHEN 325 MG PO TABS
650.0000 mg | ORAL_TABLET | Freq: Once | ORAL | Status: AC
  Administered 2014-07-02: 650 mg via ORAL

## 2014-07-02 MED ORDER — DIPHENHYDRAMINE HCL 25 MG PO CAPS
25.0000 mg | ORAL_CAPSULE | Freq: Once | ORAL | Status: AC
  Administered 2014-07-02: 25 mg via ORAL

## 2014-07-02 NOTE — Patient Instructions (Signed)
[facility] Discharge Instructions  RECOMMENDATIONS MADE BY THE CONSULTANT AND ANY TEST RESULTS WILL BE SENT TO YOUR REFERRING PHYSICIAN.  You were given 2 units of Red blood cells today. You were also given tylenol, benadryl and Lasix. Please call for any questions or concerns.   Thank you for choosing [facility] to provide your oncology and hematology care.  To afford each patient quality time with our providers, please arrive at least 15 minutes before your scheduled appointment time.  With your help, our goal is to use those 15 minutes to complete the necessary work-up to ensure our physicians have the information they need to help with your evaluation and healthcare recommendations.    Effective January 1st, 2014, we ask that you re-schedule your appointment with our physicians should you arrive 10 or more minutes late for your appointment.  We strive to give you quality time with our providers, and arriving late affects you and other patients whose appointments are after yours.    Again, thank you for choosing [facility].  Our hope is that these requests will decrease the amount of time that you wait before being seen by our physicians.       _____________________________________________________________  Should you have questions after your visit to [facility], please contact our office at [phone] between the hours of 8:30 a.m. and 4:30 p.m.  Voicemails left after 4:30 p.m. will not be returned until the following business day.  For prescription refill requests, have your pharmacy contact our office with your prescription refill request.    _______________________________________________________________  We hope that we have given you very good care.  You may receive a patient satisfaction survey in the mail, please complete it and return it as soon as possible.  We value your  feedback!  _______________________________________________________________  Have you asked about our STAR program?  STAR stands for Survivorship Training and Rehabilitation, and this is a nationally recognized cancer care program that focuses on survivorship and rehabilitation.  Cancer and cancer treatments may cause problems, such as, pain, making you feel tired and keeping you from doing the things that you need or want to do. Cancer rehabilitation can help. Our goal is to reduce these troubling effects and help you have the best quality of life possible.  You may receive a survey from a nurse that asks questions about your current state of health.  Based on the survey results, all eligible patients will be referred to the [facility] program for an evaluation so we can better serve you!  A frequently asked questions sheet is available upon request.

## 2014-07-02 NOTE — Progress Notes (Signed)
Patient tolerated 2 units of RBc's well.

## 2014-07-02 NOTE — Progress Notes (Signed)
[facility] Clinical Social Work  Clinical Social Work was referred by nurse for assessment of psychosocial needs.  Clinical Social Worker met with patient at [facility] to offer support and assess for needs.  CSW introduced self and explained role of CSW. Pt reports to be doing well and denies specific concerns at this time. Pt reports his Pt has been a huge help to him and is glad to have that as a resource. CSW provided pt with CSW handout and contact information. Pt agrees to reach out to CSW as needed.     Clinical Social Work interventions: CSW role education  Loren Racer, [place] Tuesdays 8:30-1pm Wednesdays 8:30-12pm  [phone]

## 2014-07-03 LAB — TYPE AND SCREEN
ABO/RH(D): A NEG
ANTIBODY SCREEN: NEGATIVE
UNIT DIVISION: 0
Unit division: 0

## 2014-07-05 ENCOUNTER — Ambulatory Visit (HOSPITAL_COMMUNITY)
Admission: RE | Admit: 2014-07-05 | Discharge: 2014-07-05 | Disposition: A | Discharge status: Home / Self Care | Payer: Medicare Other | Source: Ambulatory Visit | Attending: Pulmonary Disease | Admitting: Pulmonary Disease

## 2014-07-05 DIAGNOSIS — Z5189 Encounter for other specified aftercare: Secondary | ICD-10-CM | POA: Diagnosis not present

## 2014-07-05 NOTE — Progress Notes (Signed)
Physical Therapy Treatment Patient Details  Name: [name] MRN: [id number] Date of Birth: 12-12-1931  Today's Date: 07/05/2014 Time: 0802-0905 PT Time Calculation (min): 62 min TE 0802-0905  Visit#: 15 of 24  Re-eval: 07/08/14 Assessment Diagnosis: difficulty walking  Next MD Visit: Blood work every Monday Prior Therapy: 2014 for back    Subjective: Symptoms/Limitations Symptoms: Pt reports he "feels pretty good") Pain Assessment Currently in Pain?: No/denies  Exercise/Treatments Stretches Active Hamstring Stretch: 3 reps;30 seconds;Limitations Active Hamstring Stretch Limitations: 14" Box Quad Stretch: 3 reps;20 seconds;Limitations Quad Stretch Limitations: Manual stretch in prone Hip Flexor Stretch: 3 reps;30 seconds Hip Flexor Stretch Limitations: 14" Box Gastroc Stretch: 3 reps;30 seconds;Limitations Press photographer Limitations: [employer] Ups Limitations: 10 x5" Aerobic Stationary Bike: [place] #3, Lv. 4, 10' Tread Mill: 1.0 mph, 5' Standing Heel Raises: 15 reps Heel Raises Limitations: Toeraises x15  Lateral Step Up: 10 reps;Step Height: 6";Both;Hand Hold: 2 Forward Step Up: 10 reps;Step Height: 6";Both (1 HHA Lt LE, 2 HHA Rt LE) SLS with Vectors: Tandem Balance, 15" x2 each Other Standing Knee Exercises: Hip ABD/EXT 2# x10 each LE Seated Other Seated Knee Exercises: Marching with 5#, 2x10  Physical Therapy Assessment and Plan PT Assessment and Plan Clinical Impression Statement: Pt demonstartes decreased strength on the Rt side, though decreased flexibilty on the Lt LE with therapeutic exercises today.  Pt did demonstrate good technique with exercises, though VC was required with hip extension exercise to avoid forward trunk posturing as compensation to increase hip extension movement.  Pt  required SBA with tandem balance activity on the Lt LE, though close SBA-> CGA on the Rt as pt tends to lean forward to increase WB on the Lt LE vs.  attempting to increase balance/WB on the Rt LE.  VC with step up forward/lateral to decrease HHA on rails, though pt reports he is unable to complete exercise on the Rt LE withotu (B) HHA today secodnary to muscular fatigue.   Pt will benefit from skilled therapeutic intervention in order to improve on the following deficits: Decreased activity tolerance;Decreased balance;Difficulty walking;Pain;Decreased strength PT Plan: Re-Eval next visit.     Goals PT Short Term Goals PT Short Term Goal 3: Pt to walk with 20 degree forward flexion  PT Short Term Goal 3 - Progress: Progressing toward goal PT Long Term Goals PT Long Term Goal 2: Pt to be able to stand for 20 minutes to socialize PT Long Term Goal 2 - Progress: Progressing toward goal  Problem List Patient Active Problem List   Diagnosis Date Noted  . Weakness of both legs 05/09/2014  . Poor balance 05/09/2014  . Difficulty in walking(719.7) 05/09/2014  . Chest pain 03/23/2014  . CHF (congestive heart failure) 02/12/2014  . Exertional dyspnea 02/12/2014  . Chest pain of uncertain etiology 27/25/3664  . Diabetes 01/01/2014  . Chronic kidney disease (CKD) stage G3b/A2, moderately decreased glomerular filtration rate (GFR) between 30-44 mL/min/1.73 square meter and albuminuria creatinine ratio between 30-299 mg/g 12/14/2013  . NSTEMI (non-ST elevated myocardial infarction) 11/28/2013  . HCAP (healthcare-associated pneumonia) 11/27/2013  . Community acquired pneumonia 11/27/2013  . Acute on chronic systolic heart failure 40/34/7425  . Other pancytopenia 01/08/2013  . Non-ST elevation MI (NSTEMI) 01/06/2013  . High output heart failure 10/25/2012  . Myelodysplastic syndrome with 5 q minus 10/17/2012  . History of colonic polyps 02/28/2012  . Anemia 01/10/2012  . Angina effort 01/06/2012  . AVM (arteriovenous malformation) of colon without hemorrhage 01/04/2012  . GI  bleeding 10/09/2011  . Long term current use of anticoagulant  12/22/2010  . CAROTID ARTERY STENOSIS 10/16/2010  . DYSLIPIDEMIA 04/11/2009  . CAD 04/11/2009  . Atrial fibrillation 04/11/2009  . PVD 04/11/2009  . OSTEOARTHRITIS 04/11/2009    PT - End of Session Activity Tolerance: Patient tolerated treatment well General Behavior During Therapy: [facility] for tasks assessed/performed   Jackob [place] 07/05/2014, 9:02 AM

## 2014-07-08 ENCOUNTER — Encounter (HOSPITAL_BASED_OUTPATIENT_CLINIC_OR_DEPARTMENT_OTHER): Payer: Medicare Other

## 2014-07-08 ENCOUNTER — Telehealth (HOSPITAL_COMMUNITY): Payer: Self-pay | Admitting: Emergency Medicine

## 2014-07-08 DIAGNOSIS — D46C Myelodysplastic syndrome with isolated del(5q) chromosomal abnormality: Secondary | ICD-10-CM

## 2014-07-08 LAB — CBC WITH DIFFERENTIAL/PLATELET
BASOS ABS: 0.1 10*3/uL (ref 0.0–0.1)
Basophils Relative: 1 % (ref 0–1)
Eosinophils Absolute: 0.2 10*3/uL (ref 0.0–0.7)
Eosinophils Relative: 6 % — ABNORMAL HIGH (ref 0–5)
HEMATOCRIT: 29.5 % — AB (ref 39.0–52.0)
Hemoglobin: 10.1 g/dL — ABNORMAL LOW (ref 13.0–17.0)
LYMPHS ABS: 1.4 10*3/uL (ref 0.7–4.0)
Lymphocytes Relative: 35 % (ref 12–46)
MCH: 30.3 pg (ref 26.0–34.0)
MCHC: 34.2 g/dL (ref 30.0–36.0)
MCV: 88.6 fL (ref 78.0–100.0)
MONO ABS: 0.2 10*3/uL (ref 0.1–1.0)
Monocytes Relative: 6 % (ref 3–12)
NEUTROS ABS: 2.1 10*3/uL (ref 1.7–7.7)
Neutrophils Relative %: 52 % (ref 43–77)
Platelets: 107 10*3/uL — ABNORMAL LOW (ref 150–400)
RBC: 3.33 MIL/uL — ABNORMAL LOW (ref 4.22–5.81)
RDW: 19.7 % — AB (ref 11.5–15.5)
WBC: 4 10*3/uL (ref 4.0–10.5)

## 2014-07-08 NOTE — Telephone Encounter (Signed)
Call and spoke to wife about lab work.  Lab work good and to keep lab appts as planned

## 2014-07-08 NOTE — Progress Notes (Signed)
Labs for cbcd

## 2014-07-09 ENCOUNTER — Inpatient Hospital Stay (HOSPITAL_COMMUNITY): Admission: RE | Admit: 2014-07-09 | Payer: Medicare Other | Source: Ambulatory Visit | Admitting: Physical Therapy

## 2014-07-11 ENCOUNTER — Ambulatory Visit (HOSPITAL_COMMUNITY)
Admission: RE | Admit: 2014-07-11 | Discharge: 2014-07-11 | Disposition: A | Discharge status: Home / Self Care | Payer: Medicare Other | Source: Ambulatory Visit | Attending: Oncology | Admitting: Oncology

## 2014-07-11 DIAGNOSIS — R2689 Other abnormalities of gait and mobility: Secondary | ICD-10-CM

## 2014-07-11 DIAGNOSIS — Z5189 Encounter for other specified aftercare: Secondary | ICD-10-CM | POA: Diagnosis not present

## 2014-07-11 DIAGNOSIS — R29898 Other symptoms and signs involving the musculoskeletal system: Secondary | ICD-10-CM

## 2014-07-11 NOTE — Progress Notes (Signed)
Physical Therapy Re-evaluation/Treatment Note  Patient Details  Name: [name] MRN: [id number] Date of Birth: 12-20-1931  Today's Date: 07/11/2014 Time: 1008-1110 PT Time Calculation (min): 62 min Charge: TE 1008-1046, 1100-1110 MMT 1046-1100,              Visit#: 16 of 24  Re-eval: 08/08/14 Assessment Diagnosis: difficulty walking  Next MD Visit: Blood work every Monday Prior Therapy: 2014 for back   Authorization: medicare    Authorization Time Period: Gcode complete 10th visit:  Mobility Current CL, Goal CK  Authorization Visit#: 15 of 20   Subjective Symptoms/Limitations Symptoms: Pt stated he is feeling good, still continues to stagger with gait.  Pt stated he feels he has improved 40% better How long can you sit comfortably?: able to sit without problem  How long can you stand comfortably?: Able to stand for 5-10 minutes (was Able to stand for less than five minutes) How long can you walk comfortably?: Able to walk for 45 minutes through [employer] with RW (Pt reports he is able to amb ~20-25 minutes, holding onto RW/shopping cart, during grocery shopping (was 5 minutes)) Pain Assessment Currently in Pain?: No/denies  Sensation/Coordination/Flexibility/Functional Tests Flexibility 90/90: Positive Functional Tests Functional Tests: FACIT-F 117 was 128.6 Functional Tests: VAS fatigue 4.66, pain 5.66, distress 5.33  distress 3.33, fatigue 3.66, pain 3.33  Assessment RLE Strength Right Hip Flexion: 4/5 (was 3+/5) Right Hip Extension: 2+/5 (was 2+/*5) Right Hip ABduction:  (4-/5 was 3+/5) Right Hip ADduction: 4/5 (was 4/5) Right Knee Flexion:  (was 4/5) Right Knee Extension: 4/5 (was 4/5) Right Ankle Dorsiflexion: 4/5 (was 4/5) Right Ankle Plantar Flexion: 4/5 (was 4-/5) LLE Strength Left Hip Flexion: 4/5 (4/5 was 4-/5) Left Hip Extension: 3-/5 (was 3-/5) Left Hip ABduction: 4/5 (was 3+/5) Left Hip ADduction: 4/5 (was 4+/5) Left Knee Flexion: 4/5 (was  4/5) Left Knee Extension: 4/5 (was 4/5) Left Ankle Dorsiflexion: 4/5 (was 4/5) Left Ankle Plantar Flexion: 4/5 (was 4/5)  Exercise/Treatments Mobility/Balance  Static Standing Balance Single Leg Stance - Right Leg: 12 Single Leg Stance - Left Leg: 10 Tandem Stance - Right Leg: 30 Tandem Stance - Left Leg: 45 Timed Up and Go Test TUG: Normal TUG Normal TUG (seconds): 22 (was 24)   Stretches Active Hamstring Stretch: 3 reps;30 seconds;Limitations Active Hamstring Stretch Limitations: 14" Box Hip Flexor Stretch: 3 reps;30 seconds Hip Flexor Stretch Limitations: 14" Box Gastroc Stretch: 3 reps;30 seconds;Limitations Gastroc Stretch Limitations: Slantboard Aerobic Stationary Bike: [place] #4, Lv. 4, 10' Standing Functional Squat: 15 reps Supine Bridges: 20 reps Other Supine Knee Exercises: Bent knee raises 10x Prone  Hip Extension: 10 reps      Physical Therapy Assessment and Plan PT Assessment and Plan Clinical Impression Statement: Re-eval complete with the following findings:  Pt compliant with HEP and able to verbalize exercises done at home.  Pt with imporved tolerance for standing and walking with RW outdoors and reports walking with SPC indoors some.  Pt stated he feels he staggers too much when ambulating with SPC, no reports of falls,  Strength is improving slowly but is progressing.  Pt demosntrated improved poustre with gait thorugh does require cueing when fatigues to reduce forward flexed trunk.  Pt will continue to benefit from skilled intervention to address goals unmet.   PT Plan: Recommend continuing OPPT for 4 more weeks to improve posture with gait, strengthenig, balance and activity tolerance for standing for improved QOL.    Goals Home Exercise Program PT Goal: Perform Home  Exercise Program - Progress: Met PT Short Term Goals PT Short Term Goal 1: Pt to be able to stand for 10 minutes without increased pain to be able to make a sandwich for a meal PT  Short Term Goal 1 - Progress: Met PT Short Term Goal 2: Pt to be able to walk for 15 minutes for better health habits.  PT Short Term Goal 2 - Progress: Met PT Short Term Goal 3: Pt to walk with 20 degree forward flexion  PT Short Term Goal 3 - Progress: Partly met PT Long Term Goals PT Long Term Goal 1: I in advance HEP PT Long Term Goal 1 - Progress: Progressing toward goal PT Long Term Goal 2: Pt to be able to stand for 20 minutes to socialize PT Long Term Goal 2 - Progress: Progressing toward goal Long Term Goal 3: Pt to be comfortable walking inside with a cane Long Term Goal 3 Progress: Progressing toward goal Long Term Goal 4: Patient to be able to walk with his walker for 30 minutes at a time Long Term Goal 4 Progress: Met PT Long Term Goal 5: forward bent posture to be decreased to only 10 degree forward bend  Long Term Goal 5 Progress: Progressing toward goal PT Long Term Goal 6: Pain at the most to be a 3/10 80% of the time = Goal met PT Long Term Goal 7: no falls in the past two weeks = Goal met  Problem List Patient Active Problem List   Diagnosis Date Noted  . Weakness of both legs 05/09/2014  . Poor balance 05/09/2014  . Difficulty in walking(719.7) 05/09/2014  . Chest pain 03/23/2014  . CHF (congestive heart failure) 02/12/2014  . Exertional dyspnea 02/12/2014  . Chest pain of uncertain etiology 93/79/0240  . Diabetes 01/01/2014  . Chronic kidney disease (CKD) stage G3b/A2, moderately decreased glomerular filtration rate (GFR) between 30-44 mL/min/1.73 square meter and albuminuria creatinine ratio between 30-299 mg/g 12/14/2013  . NSTEMI (non-ST elevated myocardial infarction) 11/28/2013  . HCAP (healthcare-associated pneumonia) 11/27/2013  . Community acquired pneumonia 11/27/2013  . Acute on chronic systolic heart failure 97/35/3299  . Other pancytopenia 01/08/2013  . Non-ST elevation MI (NSTEMI) 01/06/2013  . High output heart failure 10/25/2012  .  Myelodysplastic syndrome with 5 q minus 10/17/2012  . History of colonic polyps 02/28/2012  . Anemia 01/10/2012  . Angina effort 01/06/2012  . AVM (arteriovenous malformation) of colon without hemorrhage 01/04/2012  . GI bleeding 10/09/2011  . Long term current use of anticoagulant 12/22/2010  . CAROTID ARTERY STENOSIS 10/16/2010  . DYSLIPIDEMIA 04/11/2009  . CAD 04/11/2009  . Atrial fibrillation 04/11/2009  . PVD 04/11/2009  . OSTEOARTHRITIS 04/11/2009    PT - End of Session Equipment Utilized During Treatment: Gait belt Activity Tolerance: Patient tolerated treatment well  GP    Aldona Lento 07/11/2014, 4:31 PM  Physician Documentation Your signature is required to indicate approval of the treatment plan as stated above.  Please sign and either send electronically or make a copy of this report for your files and return this physician signed original.   Please mark one 1.__approve of plan  2. ___approve of plan with the following conditions.   ______________________________  _____________________ Physician Signature                                                                                                             Date

## 2014-07-11 NOTE — Progress Notes (Signed)
Physical Therapy Re-evaluation/Treatment Note  Patient Details  Name: [name] MRN: [id number] Date of Birth: Jan 19, 1932  Today's Date: 07/11/2014 Time: 1008-1110 PT Time Calculation (min): 62 min Charge: TE 1008-1046, 1100-1110 MMT 1046-1100,              Visit#: 16 of 24  Re-eval: 08/08/14 Assessment Diagnosis: difficulty walking  Next MD Visit: Blood work every Monday Prior Therapy: 2014 for back   Authorization: medicare    Authorization Time Period: Gcode complete 10th visit:  Mobility Current CL, Goal CK  Authorization Visit#: 15 of 20   Subjective Symptoms/Limitations Symptoms: Pt stated he is feeling good, still continues to stagger with gait.  Pt stated he feels he has improved 40% better How long can you sit comfortably?: able to sit without problem  How long can you stand comfortably?: Able to stand for 5-10 minutes (was Able to stand for less than five minutes) How long can you walk comfortably?: Able to walk for 45 minutes through [employer] with RW (Pt reports he is able to amb ~20-25 minutes, holding onto RW/shopping cart, during grocery shopping (was 5 minutes)) Pain Assessment Currently in Pain?: No/denies  Sensation/Coordination/Flexibility/Functional Tests Flexibility 90/90: Positive Functional Tests Functional Tests: FACIT-F 117 was 128.6 Functional Tests: VAS fatigue 4.66, pain 5.66, distress 5.33  distress 3.33, fatigue 3.66, pain 3.33  Assessment RLE Strength Right Hip Flexion: 4/5 (was 3+/5) Right Hip Extension: 2+/5 (was 2+/*5) Right Hip ABduction:  (4-/5 was 3+/5) Right Hip ADduction: 4/5 (was 4/5) Right Knee Flexion:  (was 4/5) Right Knee Extension: 4/5 (was 4/5) Right Ankle Dorsiflexion: 4/5 (was 4/5) Right Ankle Plantar Flexion: 4/5 (was 4-/5) LLE Strength Left Hip Flexion: 4/5 (4/5 was 4-/5) Left Hip Extension: 3-/5 (was 3-/5) Left Hip ABduction: 4/5 (was 3+/5) Left Hip ADduction: 4/5 (was 4+/5) Left Knee Flexion: 4/5 (was  4/5) Left Knee Extension: 4/5 (was 4/5) Left Ankle Dorsiflexion: 4/5 (was 4/5) Left Ankle Plantar Flexion: 4/5 (was 4/5)  Exercise/Treatments Mobility/Balance  Static Standing Balance Single Leg Stance - Right Leg: 12 Single Leg Stance - Left Leg: 10 Tandem Stance - Right Leg: 30 Tandem Stance - Left Leg: 45 Timed Up and Go Test TUG: Normal TUG Normal TUG (seconds): 22 (was 24)   Stretches Active Hamstring Stretch: 3 reps;30 seconds;Limitations Active Hamstring Stretch Limitations: 14" Box Hip Flexor Stretch: 3 reps;30 seconds Hip Flexor Stretch Limitations: 14" Box Gastroc Stretch: 3 reps;30 seconds;Limitations Gastroc Stretch Limitations: Slantboard Aerobic Stationary Bike: [place] #4, Lv. 4, 10' Standing Functional Squat: 15 reps Supine Bridges: 20 reps Other Supine Knee Exercises: Bent knee raises 10x Prone  Hip Extension: 10 reps      Physical Therapy Assessment and Plan PT Assessment and Plan Clinical Impression Statement: Re-eval complete with the following findings:  Pt compliant with HEP and able to verbalize exercises done at home.  Pt with imporved tolerance for standing and walking with RW outdoors and reports walking with SPC indoors some.  Pt stated he feels he staggers too much when ambulating with SPC, no reports of falls,  Strength is improving slowly but is progressing.  Pt demosntrated improved poustre with gait thorugh does require cueing when fatigues to reduce forward flexed trunk.  Pt will continue to benefit from skilled intervention to address goals unmet.   PT Plan: Recommend continuing OPPT for 4 more weeks to improve posture with gait, strengthenig, balance and activity tolerance for standing for improved QOL.    Goals Home Exercise Program PT Goal: Perform Home  Exercise Program - Progress: Met PT Short Term Goals PT Short Term Goal 1: Pt to be able to stand for 10 minutes without increased pain to be able to make a sandwich for a meal PT  Short Term Goal 1 - Progress: Met PT Short Term Goal 2: Pt to be able to walk for 15 minutes for better health habits.  PT Short Term Goal 2 - Progress: Met PT Short Term Goal 3: Pt to walk with 20 degree forward flexion  PT Short Term Goal 3 - Progress: Partly met PT Long Term Goals PT Long Term Goal 1: I in advance HEP PT Long Term Goal 1 - Progress: Progressing toward goal PT Long Term Goal 2: Pt to be able to stand for 20 minutes to socialize PT Long Term Goal 2 - Progress: Progressing toward goal Long Term Goal 3: Pt to be comfortable walking inside with a cane Long Term Goal 3 Progress: Progressing toward goal Long Term Goal 4: Patient to be able to walk with his walker for 30 minutes at a time Long Term Goal 4 Progress: Met PT Long Term Goal 5: forward bent posture to be decreased to only 10 degree forward bend  Long Term Goal 5 Progress: Progressing toward goal PT Long Term Goal 6: Pain at the most to be a 3/10 80% of the time = Goal met PT Long Term Goal 7: no falls in the past two weeks = Goal met  Problem List Patient Active Problem List   Diagnosis Date Noted  . Weakness of both legs 05/09/2014  . Poor balance 05/09/2014  . Difficulty in walking(719.7) 05/09/2014  . Chest pain 03/23/2014  . CHF (congestive heart failure) 02/12/2014  . Exertional dyspnea 02/12/2014  . Chest pain of uncertain etiology 46/65/9935  . Diabetes 01/01/2014  . Chronic kidney disease (CKD) stage G3b/A2, moderately decreased glomerular filtration rate (GFR) between 30-44 mL/min/1.73 square meter and albuminuria creatinine ratio between 30-299 mg/g 12/14/2013  . NSTEMI (non-ST elevated myocardial infarction) 11/28/2013  . HCAP (healthcare-associated pneumonia) 11/27/2013  . Community acquired pneumonia 11/27/2013  . Acute on chronic systolic heart failure 70/17/7939  . Other pancytopenia 01/08/2013  . Non-ST elevation MI (NSTEMI) 01/06/2013  . High output heart failure 10/25/2012  .  Myelodysplastic syndrome with 5 q minus 10/17/2012  . History of colonic polyps 02/28/2012  . Anemia 01/10/2012  . Angina effort 01/06/2012  . AVM (arteriovenous malformation) of colon without hemorrhage 01/04/2012  . GI bleeding 10/09/2011  . Long term current use of anticoagulant 12/22/2010  . CAROTID ARTERY STENOSIS 10/16/2010  . DYSLIPIDEMIA 04/11/2009  . CAD 04/11/2009  . Atrial fibrillation 04/11/2009  . PVD 04/11/2009  . OSTEOARTHRITIS 04/11/2009    PT - End of Session Equipment Utilized During Treatment: Gait belt Activity Tolerance: Patient tolerated treatment well  GP    Aldona Lento 07/11/2014, 4:31 PM  Devona Konig PT DPT  Physician Documentation Your signature is required to indicate approval of the treatment plan as stated above.  Please sign and either send electronically or make a copy of this report for your files and return this physician signed original.   Please mark one 1.__approve of plan  2. ___approve of plan with the following conditions.   ______________________________  _____________________ Physician Signature                                                                                                             Date

## 2014-07-12 ENCOUNTER — Ambulatory Visit (HOSPITAL_COMMUNITY)
Admission: RE | Admit: 2014-07-12 | Discharge: 2014-07-12 | Disposition: A | Discharge status: Home / Self Care | Payer: Medicare Other | Source: Ambulatory Visit | Attending: Oncology | Admitting: Oncology

## 2014-07-12 DIAGNOSIS — R2689 Other abnormalities of gait and mobility: Secondary | ICD-10-CM

## 2014-07-12 DIAGNOSIS — R29898 Other symptoms and signs involving the musculoskeletal system: Secondary | ICD-10-CM

## 2014-07-12 DIAGNOSIS — Z5189 Encounter for other specified aftercare: Secondary | ICD-10-CM | POA: Diagnosis not present

## 2014-07-12 NOTE — Progress Notes (Signed)
Physical Therapy Treatment Patient Details  Name: [name] MRN: [id number] Date of Birth: [birth year]  Today's Date: 07/12/2014 Time: 1100-1145 PT Time Calculation (min): 45 min   Charges: 1100-1120 Gait training, 1121-1145 TE Visit#: 17 of 24  Re-eval: 08/08/14 Assessment Diagnosis: difficulty walking  Next MD Visit: Blood work every Monday Prior Therapy: 2014 for back   Authorization: medicare  Authorization Time Period: Gcode complete 10th visit:  Mobility Current CL, Goal CK  Authorization Visit#: 15 of 20   Subjective: Symptoms/Limitations Symptoms: patient states his white blood cell count has increased in the last with to 10 and wonders if therapy could have improved it. Patient educated on benefits of phsyical therapy for endurance and blood count.  Patient noted increase sreness today following last session whcich was yesterday.  Pain Assessment Currently in Pain?: No/denies Pain Score: 0-No pain  Exercise/Treatments Aerobic Stationary Bike: [place] Lv. 3, 15' Standing Heel Raises: 15 reps Heel Raises Limitations: Toeraises x15  Lateral Step Up: 10 reps;Both;Hand Hold: 2;Step Height: 8" (moderate assistance for Rt step up. ) Functional Squat: 15 reps Functional Squat Limitations: split stance 5x each Lunge Walking - Round Trips: 53ft 1x  SLS: 3x 20sec Gait Training: with SPC 180 feet x2, also 66ft of highknees, butt kickers, side stepping and back wards walking Other Standing Knee Exercises: blue balance beam tandem walk with cane.   Physical Therapy Assessment and Plan PT Assessment and Plan Clinical Impression Statement: Session focused on gait training with cain to decrease risk of falls while walking as patient notes a strong history of falls secondary to LE weakness and instability. Patient also progressed step ups to 8" but requires moderate assistance and bilateral UE supprot to complete on Rt LE secondary to weakness.  PT Plan: Continue focus  on increasing LE strength by focusing on depth of squatting, lunging and steps and balance during dynamic gait activities utilizign dynamic gait activities to improve coordination.     Problem List Patient Active Problem List   Diagnosis Date Noted  . Weakness of both legs 05/09/2014  . Poor balance 05/09/2014  . Difficulty in walking(719.7) 05/09/2014  . Chest pain 03/23/2014  . CHF (congestive heart failure) 02/12/2014  . Exertional dyspnea 02/12/2014  . Chest pain of uncertain etiology 97/10/6376  . Diabetes 01/01/2014  . Chronic kidney disease (CKD) stage G3b/A2, moderately decreased glomerular filtration rate (GFR) between 30-44 mL/min/1.73 square meter and albuminuria creatinine ratio between 30-299 mg/g 12/14/2013  . NSTEMI (non-ST elevated myocardial infarction) 11/28/2013  . HCAP (healthcare-associated pneumonia) 11/27/2013  . Community acquired pneumonia 11/27/2013  . Acute on chronic systolic heart failure 58/85/0277  . Other pancytopenia 01/08/2013  . Non-ST elevation MI (NSTEMI) 01/06/2013  . High output heart failure 10/25/2012  . Myelodysplastic syndrome with 5 q minus 10/17/2012  . History of colonic polyps 02/28/2012  . Anemia 01/10/2012  . Angina effort 01/06/2012  . AVM (arteriovenous malformation) of colon without hemorrhage 01/04/2012  . GI bleeding 10/09/2011  . Long term current use of anticoagulant 12/22/2010  . CAROTID ARTERY STENOSIS 10/16/2010  . DYSLIPIDEMIA 04/11/2009  . CAD 04/11/2009  . Atrial fibrillation 04/11/2009  . PVD 04/11/2009  . OSTEOARTHRITIS 04/11/2009    PT - End of Session Equipment Utilized During Treatment: Gait belt Activity Tolerance: Patient tolerated treatment well General Behavior During Therapy: WFL for tasks assessed/performed  GP Functional Assessment Tool Used: clinical judgement Functional Limitation: Mobility: Walking and moving around Mobility: Walking and Moving Around Current Status (A1287): At least 60  percent  but less than 80 percent impaired, limited or restricted Mobility: Walking and Moving Around Goal Status [phone]): At least 40 percent but less than 60 percent impaired, limited or restricted  Sydni Elizarraraz R 07/12/2014, 11:46 AM

## 2014-07-15 ENCOUNTER — Encounter (HOSPITAL_BASED_OUTPATIENT_CLINIC_OR_DEPARTMENT_OTHER): Payer: Medicare Other

## 2014-07-15 ENCOUNTER — Encounter (HOSPITAL_COMMUNITY): Payer: Self-pay

## 2014-07-15 DIAGNOSIS — D46C Myelodysplastic syndrome with isolated del(5q) chromosomal abnormality: Secondary | ICD-10-CM

## 2014-07-15 LAB — CBC WITH DIFFERENTIAL/PLATELET
BASOS ABS: 0.1 10*3/uL (ref 0.0–0.1)
BASOS PCT: 1 % (ref 0–1)
EOS ABS: 0.3 10*3/uL (ref 0.0–0.7)
EOS PCT: 8 % — AB (ref 0–5)
HCT: 27.2 % — ABNORMAL LOW (ref 39.0–52.0)
Hemoglobin: 9.3 g/dL — ABNORMAL LOW (ref 13.0–17.0)
Lymphocytes Relative: 33 % (ref 12–46)
Lymphs Abs: 1.3 10*3/uL (ref 0.7–4.0)
MCH: 30.5 pg (ref 26.0–34.0)
MCHC: 34.2 g/dL (ref 30.0–36.0)
MCV: 89.2 fL (ref 78.0–100.0)
Monocytes Absolute: 0.2 10*3/uL (ref 0.1–1.0)
Monocytes Relative: 6 % (ref 3–12)
Neutro Abs: 2.1 10*3/uL (ref 1.7–7.7)
Neutrophils Relative %: 52 % (ref 43–77)
Platelets: 128 10*3/uL — ABNORMAL LOW (ref 150–400)
RBC: 3.05 MIL/uL — ABNORMAL LOW (ref 4.22–5.81)
RDW: 20 % — AB (ref 11.5–15.5)
WBC: 4 10*3/uL (ref 4.0–10.5)

## 2014-07-15 NOTE — Progress Notes (Signed)
LABS FOR CBCD 

## 2014-07-16 ENCOUNTER — Ambulatory Visit (HOSPITAL_COMMUNITY): Payer: Medicare Other | Admitting: Physical Therapy

## 2014-07-17 ENCOUNTER — Ambulatory Visit (HOSPITAL_COMMUNITY): Payer: Medicare Other | Admitting: Speech Pathology

## 2014-07-17 ENCOUNTER — Ambulatory Visit (HOSPITAL_COMMUNITY): Payer: Medicare Other | Admitting: Specialist

## 2014-07-18 ENCOUNTER — Ambulatory Visit (HOSPITAL_COMMUNITY)
Admission: RE | Admit: 2014-07-18 | Discharge: 2014-07-18 | Disposition: A | Discharge status: Home / Self Care | Payer: Medicare Other | Source: Ambulatory Visit | Attending: Oncology | Admitting: Oncology

## 2014-07-18 DIAGNOSIS — Z5189 Encounter for other specified aftercare: Secondary | ICD-10-CM | POA: Diagnosis not present

## 2014-07-18 DIAGNOSIS — R2689 Other abnormalities of gait and mobility: Secondary | ICD-10-CM

## 2014-07-18 DIAGNOSIS — R29898 Other symptoms and signs involving the musculoskeletal system: Secondary | ICD-10-CM

## 2014-07-18 NOTE — Progress Notes (Signed)
Physical Therapy Treatment Patient Details  Name: [name] MRN: [id number] Date of Birth: 1932-08-03  Today's Date: 07/18/2014 Time: 1106-1158 PT Time Calculation (min): 52 min Visit#: 18 of 24  Re-eval: 08/08/14 Charges:  Gait 0712-1975 (12'), therex 1120-1156 (36')  Authorization: medicare  Authorization Time Period: Gcode complete 10th visit:  Mobility Current CL, Goal CK  Authorization Visit#: 18 of 20   Subjective: Symptoms/Limitations Symptoms: Pt states he is only hurting a little in his legs.  States it increases during therapy 3-5/10.  States he's overall feeling good. Pain Assessment Currently in Pain?: Yes Pain Score: 3  Pain Location: Leg Pain Orientation: Right;Left   Exercise/Treatments Aerobic Stationary Bike: [place] 3 Lv.4 10' Standing Heel Raises: 15 reps Heel Raises Limitations: Toeraises x15  Lunge Walking - Round Trips: 51f 2RT Gait Training: without AD 180 feet x2, also 366fof highknees, butt kickers, side stepping and back wards walking Other Standing Knee Exercises: blue balance beam tandem walk with cane X 2 RT no AD X 1 RT.       Physical Therapy Assessment and Plan PT Assessment and Plan Clinical Impression Statement: Able to progress to balance activities without use of SPC today.  Less rest breaks needed today. PT Plan: Continue focus on increasing LE strength by focusing on depth of squatting, lunging and steps and balance during dynamic gait activities utilizign dynamic gait activities to improve coordination.     Goals PT Short Term Goals PT Short Term Goal 1: Pt to be able to stand for 10 minutes without increased pain to be able to make a sandwich for a meal PT Short Term Goal 2: Pt to be able to walk for 15 minutes for better health habits.  PT Short Term Goal 3: Pt to walk with 20 degree forward flexion  PT Long Term Goals PT Long Term Goal 1: I in advance HEP PT Long Term Goal 2: Pt to be able to stand for 20  minutes to socialize Long Term Goal 3: Pt to be comfortable walking inside with a cane Long Term Goal 4: Patient to be able to walk with his walker for 30 minutes at a time PT Long Term Goal 5: forward bent posture to be decreased to only 10 degree forward bend  PT Long Term Goal 6: Pain at the most to be a 3/10 80% of the time = Goal met PT Long Term Goal 7: no falls in the past two weeks = Goal met  Problem List Patient Active Problem List   Diagnosis Date Noted  . Weakness of both legs 05/09/2014  . Poor balance 05/09/2014  . Difficulty in walking(719.7) 05/09/2014  . Chest pain 03/23/2014  . CHF (congestive heart failure) 02/12/2014  . Exertional dyspnea 02/12/2014  . Chest pain of uncertain etiology 0588/32/5498. Diabetes 01/01/2014  . Chronic kidney disease (CKD) stage G3b/A2, moderately decreased glomerular filtration rate (GFR) between 30-44 mL/min/1.73 square meter and albuminuria creatinine ratio between 30-299 mg/g 12/14/2013  . NSTEMI (non-ST elevated myocardial infarction) 11/28/2013  . HCAP (healthcare-associated pneumonia) 11/27/2013  . Community acquired pneumonia 11/27/2013  . Acute on chronic systolic heart failure 0426/41/5830. Other pancytopenia 01/08/2013  . Non-ST elevation MI (NSTEMI) 01/06/2013  . High output heart failure 10/25/2012  . Myelodysplastic syndrome with 5 q minus 10/17/2012  . History of colonic polyps 02/28/2012  . Anemia 01/10/2012  . Angina effort 01/06/2012  . AVM (arteriovenous malformation) of colon without hemorrhage 01/04/2012  . GI  bleeding 10/09/2011  . Long term current use of anticoagulant 12/22/2010  . CAROTID ARTERY STENOSIS 10/16/2010  . DYSLIPIDEMIA 04/11/2009  . CAD 04/11/2009  . Atrial fibrillation 04/11/2009  . PVD 04/11/2009  . OSTEOARTHRITIS 04/11/2009    PT - End of Session Equipment Utilized During Treatment: Gait belt Activity Tolerance: Patient tolerated treatment well General Behavior During Therapy: WFL for  tasks assessed/performed   Teena Irani, PTA/CLT 07/18/2014, 12:00 PM

## 2014-07-19 ENCOUNTER — Ambulatory Visit (HOSPITAL_COMMUNITY)
Admission: RE | Admit: 2014-07-19 | Discharge: 2014-07-19 | Disposition: A | Discharge status: Home / Self Care | Payer: Medicare Other | Source: Ambulatory Visit | Attending: Oncology | Admitting: Oncology

## 2014-07-19 ENCOUNTER — Ambulatory Visit (HOSPITAL_COMMUNITY): Payer: Medicare Other | Admitting: Physical Therapy

## 2014-07-19 DIAGNOSIS — R2689 Other abnormalities of gait and mobility: Secondary | ICD-10-CM

## 2014-07-19 DIAGNOSIS — R29898 Other symptoms and signs involving the musculoskeletal system: Secondary | ICD-10-CM

## 2014-07-19 DIAGNOSIS — Z5189 Encounter for other specified aftercare: Secondary | ICD-10-CM | POA: Diagnosis not present

## 2014-07-19 NOTE — Progress Notes (Signed)
Physical Therapy Treatment Patient Details  Name: [name] MRN: [id number] Date of Birth: 23-Sep-1932  Today's Date: 07/19/2014 Time: 1017-1100 PT Time Calculation (min): 43 min   Charges: TE 1017-1050, Gait training 1050-1100 Visit#: 19 of 24  Re-eval: 08/08/14 Assessment Diagnosis: difficulty walking  Next MD Visit: Blood work every Monday Prior Therapy: 2014 for back   Authorization: medicare  Authorization Time Period: Gcode complete 10th visit:  Mobility Current CL, Goal CK  Authorization Visit#: 19 of 20   Subjective: Symptoms/Limitations Symptoms: Patient states he is feeling good today notes pain will increase during therapy 3-5/10, but improveds after and is overall better. States he's overall feeling good. Pain Assessment Currently in Pain?: Yes Pain Score: 3  Pain Location: Leg Pain Orientation: Right;Left  Precautions/Restrictions     Exercise/Treatments Aerobic Stationary Bike: [place] 3 Lv.5 10' Standing Heel Raises: 3 sets;10 reps Heel Raises Limitations: Toeraises x15  Forward Lunges: 20 reps Forward Lunges Limitations: to floor static, to 4" box dynamic Lateral Step Up: 10 reps;Step Height: 6";Both;Hand Hold: 2;Step Height: 8" Forward Step Up: 10 reps;Both;Step Height: 8" (1 HHA Lt LE, 2 HHA Rt LE) Functional Squat: 15 reps Functional Squat Limitations: split stance 5x each Gait Training: without AD 180 feet x2, also 26ft of highknees, butt kickers, side stepping and back wards walking Other Standing Knee Exercises: blue balance beam tandem walk with cane X 2 RT no AD X 1 RT.   Physical Therapy Assessment and Plan PT Assessment and Plan Clinical Impression Statement: Patient demosntrated good tolerance for progressed heigth and depth of loading regularly performing extra repetitions of each exercise. no discomfort noted. Patient demonstrated increased difficulty with balance beam walk likely due to fatigue PT Plan: Continue focus on  increasing LE strength by focusing on depth of squatting, lunging and steps and balance during dynamic gait activities utilizign dynamic gait activities to improve coordination. Attempt agility ladder next session to improve stepping reaction.    Goals PT Long Term Goals PT Long Term Goal 1: I in advance HEP PT Long Term Goal 1 - Progress: Progressing toward goal PT Long Term Goal 2: Pt to be able to stand for 20 minutes to socialize PT Long Term Goal 2 - Progress: Progressing toward goal Long Term Goal 3: Pt to be comfortable walking inside with a cane Long Term Goal 3 Progress: Progressing toward goal  Problem List Patient Active Problem List   Diagnosis Date Noted  . Weakness of both legs 05/09/2014  . Poor balance 05/09/2014  . Difficulty in walking(719.7) 05/09/2014  . Chest pain 03/23/2014  . CHF (congestive heart failure) 02/12/2014  . Exertional dyspnea 02/12/2014  . Chest pain of uncertain etiology 15/40/0867  . Diabetes 01/01/2014  . Chronic kidney disease (CKD) stage G3b/A2, moderately decreased glomerular filtration rate (GFR) between 30-44 mL/min/1.73 square meter and albuminuria creatinine ratio between 30-299 mg/g 12/14/2013  . NSTEMI (non-ST elevated myocardial infarction) 11/28/2013  . HCAP (healthcare-associated pneumonia) 11/27/2013  . Community acquired pneumonia 11/27/2013  . Acute on chronic systolic heart failure 61/95/0932  . Other pancytopenia 01/08/2013  . Non-ST elevation MI (NSTEMI) 01/06/2013  . High output heart failure 10/25/2012  . Myelodysplastic syndrome with 5 q minus 10/17/2012  . History of colonic polyps 02/28/2012  . Anemia 01/10/2012  . Angina effort 01/06/2012  . AVM (arteriovenous malformation) of colon without hemorrhage 01/04/2012  . GI bleeding 10/09/2011  . Long term current use of anticoagulant 12/22/2010  . CAROTID ARTERY STENOSIS 10/16/2010  .  DYSLIPIDEMIA 04/11/2009  . CAD 04/11/2009  . Atrial fibrillation 04/11/2009  . PVD  04/11/2009  . OSTEOARTHRITIS 04/11/2009    PT - End of Session Equipment Utilized During Treatment: Gait belt Activity Tolerance: Patient tolerated treatment well General Behavior During Therapy: [facility] for tasks assessed/performed  GP    Anthonyjames Bargar R 07/19/2014, 11:08 AM

## 2014-07-22 ENCOUNTER — Other Ambulatory Visit (HOSPITAL_COMMUNITY): Payer: Medicare Other

## 2014-07-22 ENCOUNTER — Other Ambulatory Visit (HOSPITAL_COMMUNITY): Payer: Self-pay | Admitting: Hematology and Oncology

## 2014-07-22 ENCOUNTER — Encounter (HOSPITAL_BASED_OUTPATIENT_CLINIC_OR_DEPARTMENT_OTHER): Payer: Medicare Other

## 2014-07-22 DIAGNOSIS — D46C Myelodysplastic syndrome with isolated del(5q) chromosomal abnormality: Secondary | ICD-10-CM

## 2014-07-22 LAB — CBC WITH DIFFERENTIAL/PLATELET
Basophils Absolute: 0.1 10*3/uL (ref 0.0–0.1)
Basophils Relative: 2 % — ABNORMAL HIGH (ref 0–1)
Eosinophils Absolute: 0.2 10*3/uL (ref 0.0–0.7)
Eosinophils Relative: 5 % (ref 0–5)
HCT: 23 % — ABNORMAL LOW (ref 39.0–52.0)
Hemoglobin: 7.8 g/dL — ABNORMAL LOW (ref 13.0–17.0)
LYMPHS PCT: 35 % (ref 12–46)
Lymphs Abs: 1.4 10*3/uL (ref 0.7–4.0)
MCH: 30.6 pg (ref 26.0–34.0)
MCHC: 33.9 g/dL (ref 30.0–36.0)
MCV: 90.2 fL (ref 78.0–100.0)
Monocytes Absolute: 0.2 10*3/uL (ref 0.1–1.0)
Monocytes Relative: 5 % (ref 3–12)
NEUTROS ABS: 2 10*3/uL (ref 1.7–7.7)
NEUTROS PCT: 53 % (ref 43–77)
Platelets: 127 10*3/uL — ABNORMAL LOW (ref 150–400)
RBC: 2.55 MIL/uL — ABNORMAL LOW (ref 4.22–5.81)
RDW: 20.8 % — AB (ref 11.5–15.5)
WBC: 3.8 10*3/uL — AB (ref 4.0–10.5)

## 2014-07-22 LAB — COMPREHENSIVE METABOLIC PANEL
ALBUMIN: 3.7 g/dL (ref 3.5–5.2)
ALT: 23 U/L (ref 0–53)
AST: 21 U/L (ref 0–37)
Alkaline Phosphatase: 83 U/L (ref 39–117)
Anion gap: 12 (ref 5–15)
BILIRUBIN TOTAL: 0.7 mg/dL (ref 0.3–1.2)
BUN: 22 mg/dL (ref 6–23)
CALCIUM: 10.4 mg/dL (ref 8.4–10.5)
CO2: 28 meq/L (ref 19–32)
Chloride: 105 mEq/L (ref 96–112)
Creatinine, Ser: 1.37 mg/dL — ABNORMAL HIGH (ref 0.50–1.35)
GFR calc Af Amer: 55 mL/min — ABNORMAL LOW (ref >90)
GFR, EST NON AFRICAN AMERICAN: 47 mL/min — AB (ref >90)
Glucose, Bld: 148 mg/dL — ABNORMAL HIGH (ref 70–99)
Potassium: 4 mEq/L (ref 3.7–5.3)
Sodium: 145 mEq/L (ref 137–147)
Total Protein: 7.2 g/dL (ref 6.0–8.3)

## 2014-07-22 LAB — PREPARE RBC (CROSSMATCH)

## 2014-07-22 NOTE — Progress Notes (Addendum)
LABS FOR CBCD,CMP, ADDED TYSCR

## 2014-07-22 NOTE — Progress Notes (Signed)
Caleb Bogus, MD [place] [address]  Myelodysplastic syndrome with 5 q minus - Plan: CBC with Differential, Comprehensive metabolic panel, Vitamin V25  CURRENT THERAPY: Observation with support of Hgb via blood transfusions to maintain a Hgb of 9 g/dL or greater due to cardiac history. Last transfusion was on 04/23/2014. Revlimid on hold due to worsening renal function  INTERVAL HISTORY: [name] 80 y.o. male returns for  regular  visit for followup of 5Q- MDS, with Revlimid on hold due to progressively worse renal function on 03/07/2014.    Myelodysplastic syndrome with 5 q minus   09/05/2012 Bone Marrow Biopsy 5 q- MDS   09/19/2012 - 12/15/2012 Chemotherapy Approximate start date of Revlimid 5 mg 21 days on and 7 days off   12/16/2012 - 07/18/2013 Chemotherapy Dose increased to 10 mg Revlimid 21 days on and 7 days off   07/19/2013 - 03/07/2014 Chemotherapy Dose increased to 10 mg daily and then altered a number of times due to hospitalizations.   03/07/2014 Adverse Reaction Worsening renal function   03/07/2014 Treatment Plan Change Revlimid discontinued     I personally reviewed and went over laboratory results with the patient.  The results are noted within this dictation.  He feels great since being involved with the STAR program.  He reports his energy is improved and his overall wellbeing is improved.  His balance and ambulation continues to be positively impacted.  Hematologically, he denies any complaints and ROS questioning is negative.   Past Medical History  Diagnosis Date  . Essential hypertension, benign   . COPD (chronic obstructive pulmonary disease)   . Coronary atherosclerosis of native coronary artery     Multivessel status post CABG 1996  . Arthritis   . Atrial fibrillation     Coumadin stopped 12/2011 due to anemia  . Dysphagia   . Anemia     Requiring blood transfusions  . Carotid stenosis     Dr Scot Dock   .  Diabetes mellitus, type 2   . Mixed hyperlipidemia     Statin intolerant  . Hematuria     Felt related to Foley insertion  . Cardiomyopathy     LVEF 45-50%  . Melena     EGD & Colonoscopy 09/2011 revealed gastritis, erosions, scars, diverticula, 8 colon polyps (largest 1.6cm, not removed 2/2 anticoagulation), small AVM in transverse colon  . History of tobacco abuse     Quit 1992  . Anxiety   . GERD (gastroesophageal reflux disease)   . Myocardial infarction   . Thrombocytopenia   . Myelodysplastic syndrome with 5 q minus 10/17/2012    On Revlimid 5 mg daily 21 days on and 7 days off  . Aortic stenosis     Mild  . CKD (chronic kidney disease) stage 3, GFR 30-59 ml/min   . Pneumonia 11/2013    history of    has DYSLIPIDEMIA; CAD; Atrial fibrillation; PVD; OSTEOARTHRITIS; CAROTID ARTERY STENOSIS; Long term current use of anticoagulant; GI bleeding; AVM (arteriovenous malformation) of colon without hemorrhage; Angina effort; Anemia; History of colonic polyps; Myelodysplastic syndrome with 5 q minus; High output heart failure; Non-ST elevation MI (NSTEMI); Acute on chronic systolic heart failure; Other pancytopenia; HCAP (healthcare-associated pneumonia); Community acquired pneumonia; NSTEMI (non-ST elevated myocardial infarction); Chronic kidney disease (CKD) stage G3b/A2, moderately decreased glomerular filtration rate (GFR) between 30-44 mL/min/1.73 square meter and albuminuria creatinine ratio between 30-299 mg/g; Diabetes; CHF (congestive heart failure); Exertional dyspnea; Chest  pain of uncertain etiology; Chest pain; Weakness of both legs; Poor balance; and Difficulty in walking(719.7) on his problem list.     is allergic to statins and tape.  Caleb. [name] does not currently have medications on file.  Past Surgical History  Procedure Laterality Date  . Knee surgery  1960    Right knee cartilage  . Rotator cuff repair  1990    right   . Inguinal hernia repair  2000 and 2010    left   . Cholecystectomy  05/2010    Lap chole with biologic mesh repair/reinforcement by  Dr Rise Patience  . Back surgery  02/2006    ruptured disk,  post op diskitis infection requiring prolonged hospital stay and  surgical I & D  . Tonsillectomy    . Esophagogastroduodenoscopy  10/08/2011    Procedure: ESOPHAGOGASTRODUODENOSCOPY (EGD);  Surgeon: [name], MD;  [facility];  Service: Endoscopy;  Laterality: N/A;  . Colonoscopy  10/08/2011    Procedure: COLONOSCOPY;  Surgeon: [name], MD;  Location: [facility];  Service: Endoscopy;  Laterality: N/A;  . Cataract extraction, bilateral    . Peg placement  07/2006    placed due to prolonged infection, poor po intake, malnutrition during several week hospital stay resulting from ruptured disc that became infected following surgery  . Colonoscopy  01/14/2012    Procedure: COLONOSCOPY;  Surgeon: [name], MD;  Location: [facility];  Service: Endoscopy;  Laterality: N/A;  945  . Coronary artery bypass graft  1996    CABG x 4  . Ventral hernia repair    . Colonoscopy  04/20/2012    Procedure: COLONOSCOPY;  Surgeon: [name], MD;  Location: [facility];  Service: Endoscopy;  Laterality: N/A;  1030  . Pr vein bypass graft,aorto-fem-pop  1996  . Spine surgery    . Joint replacement  1960    Right Knee  . Lymph node biopsy  08/15/2012    Procedure: LYMPH NODE BIOPSY;  Surgeon: Scherry Ran, MD;  Location: [facility];  Service: General;  Laterality: Left;  Cervical Lymph Node Bx in Minor Room  . Bone marrow biopsy    . Bone marrow aspiration      Denies any headaches, dizziness, double vision, fevers, chills, night sweats, nausea, vomiting, diarrhea, constipation, chest pain, heart palpitations, shortness of breath, blood in stool, black tarry stool, urinary pain, urinary burning, urinary frequency, hematuria.   PHYSICAL EXAMINATION  ECOG PERFORMANCE STATUS: 1 - Symptomatic but completely ambulatory  Filed  Vitals:   07/24/14 1100  BP: 110/58  Pulse: 79  Temp: 98.4 F (36.9 C)  Resp: 18    GENERAL:alert, no distress, well nourished, well developed, comfortable, cooperative and smiling SKIN: skin color, texture, turgor are normal, no rashes or significant lesions HEAD: Normocephalic, No masses, lesions, tenderness or abnormalities EYES: normal, PERRLA, EOMI, Conjunctiva are pink and non-injected EARS: External ears normal OROPHARYNX:mucous membranes are moist  NECK: supple, no stridor, non-tender, trachea midline LYMPH:  no palpable lymphadenopathy BREAST:not examined LUNGS: not examined HEART: not examined ABDOMEN:not examined BACK: Back symmetric, no curvature. EXTREMITIES:less then 2 second capillary refill, no joint deformities, effusion, or inflammation, no skin discoloration, no clubbing  NEURO: alert & oriented x 3 with fluent speech, no focal motor/sensory deficits, gait normal with rolling walker   LABORATORY DATA: CBC    Component Value Date/Time   WBC 3.8* 07/22/2014 0832   RBC 2.55* 07/22/2014 0832   RBC  3.20* 03/25/2014 0852   HGB 7.8* 07/22/2014 0832   HCT 23.0* 07/22/2014 0832   PLT 127* 07/22/2014 0832   MCV 90.2 07/22/2014 0832   MCH 30.6 07/22/2014 0832   MCHC 33.9 07/22/2014 0832   RDW 20.8* 07/22/2014 0832   LYMPHSABS 1.4 07/22/2014 0832   MONOABS 0.2 07/22/2014 0832   EOSABS 0.2 07/22/2014 0832   BASOSABS 0.1 07/22/2014 0832      ASSESSMENT:  1. 5Q- MDS, Revlimid on hold due to progressive renal failure and co-morbidities.  2. Renal failure  3. Chronic renal disease, grade 3B  4. Vitamin B12 deficiency, intrinsic factor antibody negative (03/26/2014).   Patient Active Problem List   Diagnosis Date Noted  . Weakness of both legs 05/09/2014  . Poor balance 05/09/2014  . Difficulty in walking(719.7) 05/09/2014  . Chest pain 03/23/2014  . CHF (congestive heart failure) 02/12/2014  . Exertional dyspnea 02/12/2014  . Chest pain of uncertain  etiology 85/88/5027  . Diabetes 01/01/2014  . Chronic kidney disease (CKD) stage G3b/A2, moderately decreased glomerular filtration rate (GFR) between 30-44 mL/min/1.73 square meter and albuminuria creatinine ratio between 30-299 mg/g 12/14/2013  . NSTEMI (non-ST elevated myocardial infarction) 11/28/2013  . HCAP (healthcare-associated pneumonia) 11/27/2013  . Community acquired pneumonia 11/27/2013  . Acute on chronic systolic heart failure 74/08/8785  . Other pancytopenia 01/08/2013  . Non-ST elevation MI (NSTEMI) 01/06/2013  . High output heart failure 10/25/2012  . Myelodysplastic syndrome with 5 q minus 10/17/2012  . History of colonic polyps 02/28/2012  . Anemia 01/10/2012  . Angina effort 01/06/2012  . AVM (arteriovenous malformation) of colon without hemorrhage 01/04/2012  . GI bleeding 10/09/2011  . Long term current use of anticoagulant 12/22/2010  . CAROTID ARTERY STENOSIS 10/16/2010  . DYSLIPIDEMIA 04/11/2009  . CAD 04/11/2009  . Atrial fibrillation 04/11/2009  . PVD 04/11/2009  . OSTEOARTHRITIS 04/11/2009     PLAN:  1. I personally reviewed and went over laboratory results with the patient. The results are noted within this dictation.  2. Labs next week: CBC diff, CMET, B12  3. Labs weekly: CBC diff  4. Will administer PRBCs to maintain a Hgb of 9 g/dL due to cardiac history  5. Labs in 12 weeks: CBC diff, CMET  6. Return in 12 weeks for follow-up   THERAPY PLAN:  We will continue to hold Revlimid due to renal function and maintain his Hgb at 9 g/dL or greater due to cardiac history with PRBCs. He will likely succumb to one of his other co-morbidites and less likely from MDS. As a result, we desire to treat him conservatively due to the risk associated with Revlimid.  All questions were answered. The patient knows to call the clinic with any problems, questions or concerns. We can certainly see the patient much sooner if necessary.  Patient and plan discussed with  Dr. Farrel Gobble and he is in agreement with the aforementioned.   KEFALAS,THOMAS 07/24/2014

## 2014-07-23 ENCOUNTER — Ambulatory Visit (HOSPITAL_COMMUNITY): Payer: Medicare Other | Admitting: Physical Therapy

## 2014-07-23 ENCOUNTER — Encounter (HOSPITAL_COMMUNITY): Payer: Self-pay

## 2014-07-23 ENCOUNTER — Ambulatory Visit (HOSPITAL_COMMUNITY): Payer: Medicare Other

## 2014-07-23 ENCOUNTER — Encounter (HOSPITAL_BASED_OUTPATIENT_CLINIC_OR_DEPARTMENT_OTHER): Payer: Medicare Other

## 2014-07-23 VITALS — BP 130/53 | HR 67 | Temp 97.6°F | Resp 18

## 2014-07-23 DIAGNOSIS — D46C Myelodysplastic syndrome with isolated del(5q) chromosomal abnormality: Secondary | ICD-10-CM | POA: Diagnosis not present

## 2014-07-23 MED ORDER — DIPHENHYDRAMINE HCL 25 MG PO CAPS
ORAL_CAPSULE | ORAL | Status: AC
  Filled 2014-07-23: qty 1

## 2014-07-23 MED ORDER — DIPHENHYDRAMINE HCL 25 MG PO CAPS
25.0000 mg | ORAL_CAPSULE | Freq: Once | ORAL | Status: AC
  Administered 2014-07-23: 25 mg via ORAL

## 2014-07-23 MED ORDER — SODIUM CHLORIDE 0.9 % IV SOLN
250.0000 mL | Freq: Once | INTRAVENOUS | Status: AC
  Administered 2014-07-23: 250 mL via INTRAVENOUS

## 2014-07-23 NOTE — Progress Notes (Signed)
1145:  IV access right forearm infiltrated - blood transfusion stopped; IV access discontinued and warm compress applied.  IV access obtained with 22g to left lateral forearm x 1 attempt; + blood return and flushes w/o difficulty; no s/s infiltration at site.  Blood transfusion restarted in this site.   1400:  Transfusion complete with no adverse reaction; VSS.

## 2014-07-24 ENCOUNTER — Encounter (HOSPITAL_COMMUNITY): Payer: Self-pay | Admitting: Oncology

## 2014-07-24 ENCOUNTER — Encounter (HOSPITAL_BASED_OUTPATIENT_CLINIC_OR_DEPARTMENT_OTHER): Payer: Medicare Other | Admitting: Oncology

## 2014-07-24 ENCOUNTER — Ambulatory Visit (HOSPITAL_COMMUNITY)
Admission: RE | Admit: 2014-07-24 | Discharge: 2014-07-24 | Disposition: A | Discharge status: Home / Self Care | Payer: Medicare Other | Source: Ambulatory Visit | Attending: Oncology | Admitting: Oncology

## 2014-07-24 VITALS — BP 110/58 | HR 79 | Temp 98.4°F | Resp 18

## 2014-07-24 DIAGNOSIS — R29898 Other symptoms and signs involving the musculoskeletal system: Secondary | ICD-10-CM

## 2014-07-24 DIAGNOSIS — N183 Chronic kidney disease, stage 3 (moderate): Secondary | ICD-10-CM

## 2014-07-24 DIAGNOSIS — R2689 Other abnormalities of gait and mobility: Secondary | ICD-10-CM

## 2014-07-24 DIAGNOSIS — Z5189 Encounter for other specified aftercare: Secondary | ICD-10-CM | POA: Diagnosis not present

## 2014-07-24 DIAGNOSIS — E538 Deficiency of other specified B group vitamins: Secondary | ICD-10-CM

## 2014-07-24 DIAGNOSIS — D46C Myelodysplastic syndrome with isolated del(5q) chromosomal abnormality: Secondary | ICD-10-CM

## 2014-07-24 DIAGNOSIS — N19 Unspecified kidney failure: Secondary | ICD-10-CM

## 2014-07-24 LAB — TYPE AND SCREEN
ABO/RH(D): A NEG
Antibody Screen: NEGATIVE
UNIT DIVISION: 0
Unit division: 0

## 2014-07-24 NOTE — Patient Instructions (Signed)
[facility] Discharge Instructions  RECOMMENDATIONS MADE BY THE CONSULTANT AND ANY TEST RESULTS WILL BE SENT TO YOUR REFERRING PHYSICIAN.   We will see you as scheduled for weekly lab work. You will see Tom in 3 months for a doctor's appointment.  Please call for any questions or concerns.    Thank you for choosing [facility] to provide your oncology and hematology care.  To afford each patient quality time with our providers, please arrive at least 15 minutes before your scheduled appointment time.  With your help, our goal is to use those 15 minutes to complete the necessary work-up to ensure our physicians have the information they need to help with your evaluation and healthcare recommendations.    Effective January 1st, 2014, we ask that you re-schedule your appointment with our physicians should you arrive 10 or more minutes late for your appointment.  We strive to give you quality time with our providers, and arriving late affects you and other patients whose appointments are after yours.    Again, thank you for choosing [facility].  Our hope is that these requests will decrease the amount of time that you wait before being seen by our physicians.       _____________________________________________________________  Should you have questions after your visit to [facility], please contact our office at [phone] between the hours of 8:30 a.m. and 4:30 p.m.  Voicemails left after 4:30 p.m. will not be returned until the following business day.  For prescription refill requests, have your pharmacy contact our office with your prescription refill request.    _______________________________________________________________  We hope that we have given you very good care.  You may receive a patient satisfaction survey in the mail, please complete it and return it as soon as possible.  We value your  feedback!  _______________________________________________________________  Have you asked about our STAR program?  STAR stands for Survivorship Training and Rehabilitation, and this is a nationally recognized cancer care program that focuses on survivorship and rehabilitation.  Cancer and cancer treatments may cause problems, such as, pain, making you feel tired and keeping you from doing the things that you need or want to do. Cancer rehabilitation can help. Our goal is to reduce these troubling effects and help you have the best quality of life possible.  You may receive a survey from a nurse that asks questions about your current state of health.  Based on the survey results, all eligible patients will be referred to the [facility] program for an evaluation so we can better serve you!  A frequently asked questions sheet is available upon request.

## 2014-07-24 NOTE — Progress Notes (Signed)
Physical Therapy Treatment Patient Details  Name: [name] MRN: [id number] Date of Birth: [birth year]  Today's Date: 07/24/2014 Time: 1306-1350 PT Time Calculation (min): 44 min Charge there ex [phone]  Visit#: 20 of 24  Re-eval: 08/08/14    Authorization: medicare  Authorization Time Period: G code completed 20th visit clinical judgement   Authorization Visit#: 20 of 24   Subjective: Symptoms/Limitations Symptoms: Pt states he just got some blood yesterday so he has more energy today  Pertinent History: back surgery 2007 the patient got an infection and ended up in the hospital for 72 days and the patient did not walk for almost a year.  ; myelodysplastic syndrom (bone marrow does not make blood). Pt started Chemo a year ago last treatement was July.  Pt needs blood transfusions 2 pints a week for the past 7 months. Pain Assessment Currently in Pain?: No/denies  Exercise/Treatments   Aerobic Stationary Bike: [place] 3 Lv.5 10'   Standing Heel Raises: 10 reps;Limitations Heel Raises Limitations: combo with functional squt with yellow ball on mat Forward Lunges: 10 reps Lateral Step Up: 10 reps Forward Step Up: 10 reps SLS: x3 B Other Standing Knee Exercises: stand tall no wall B UE flex x 10; marching x 10  Seated Other Seated Knee Exercises: sit to stand 10x Rt 10 x Lt     Physical Therapy Assessment and Plan PT Assessment and Plan Clinical Impression Statement: Pt improving in both strength and balance.  Completed exercise away from // to increase difficulty as welll as install confidence of patient.  PT Plan: Continue focus on increasing LE strength by focusing on depth of squatting, lunging and steps and balance during dynamic gait activities utilizign dynamic gait activities to improve coordination. Ladder was not completed today due to time resttrraints ;attempt agility ladder next session to improve stepping reaction.    Goals    progressing Problem List Patient Active Problem List   Diagnosis Date Noted  . Weakness of both legs 05/09/2014  . Poor balance 05/09/2014  . Difficulty in walking(719.7) 05/09/2014  . Chest pain 03/23/2014  . CHF (congestive heart failure) 02/12/2014  . Exertional dyspnea 02/12/2014  . Chest pain of uncertain etiology 71/24/5809  . Diabetes 01/01/2014  . Chronic kidney disease (CKD) stage G3b/A2, moderately decreased glomerular filtration rate (GFR) between 30-44 mL/min/1.73 square meter and albuminuria creatinine ratio between 30-299 mg/g 12/14/2013  . NSTEMI (non-ST elevated myocardial infarction) 11/28/2013  . HCAP (healthcare-associated pneumonia) 11/27/2013  . Community acquired pneumonia 11/27/2013  . Acute on chronic systolic heart failure 98/33/8250  . Other pancytopenia 01/08/2013  . Non-ST elevation MI (NSTEMI) 01/06/2013  . High output heart failure 10/25/2012  . Myelodysplastic syndrome with 5 q minus 10/17/2012  . History of colonic polyps 02/28/2012  . Anemia 01/10/2012  . Angina effort 01/06/2012  . AVM (arteriovenous malformation) of colon without hemorrhage 01/04/2012  . GI bleeding 10/09/2011  . Long term current use of anticoagulant 12/22/2010  . CAROTID ARTERY STENOSIS 10/16/2010  . DYSLIPIDEMIA 04/11/2009  . CAD 04/11/2009  . Atrial fibrillation 04/11/2009  . PVD 04/11/2009  . OSTEOARTHRITIS 04/11/2009       GP Functional Assessment Tool Used: clinical judgement Functional Limitation: Mobility: Walking and moving around Mobility: Walking and Moving Around Current Status [phone]): At least 40 percent but less than 60 percent impaired, limited or restricted Mobility: Walking and Moving Around Goal Status [phone]): At least 40 percent but less than 60 percent impaired, limited or restricted  [name]  07/24/2014, 5:16 PM

## 2014-07-25 ENCOUNTER — Ambulatory Visit (HOSPITAL_COMMUNITY)
Admission: RE | Admit: 2014-07-25 | Discharge: 2014-07-25 | Disposition: A | Discharge status: Home / Self Care | Payer: Medicare Other | Source: Ambulatory Visit | Attending: Oncology | Admitting: Oncology

## 2014-07-25 DIAGNOSIS — Z5189 Encounter for other specified aftercare: Secondary | ICD-10-CM | POA: Diagnosis not present

## 2014-07-25 DIAGNOSIS — R29898 Other symptoms and signs involving the musculoskeletal system: Secondary | ICD-10-CM

## 2014-07-25 DIAGNOSIS — R2689 Other abnormalities of gait and mobility: Secondary | ICD-10-CM

## 2014-07-25 NOTE — Progress Notes (Signed)
Physical Therapy Treatment Patient Details  Name: [name] MRN: [id number] Date of Birth: 01/23/1932  Today's Date: 07/25/2014 Time: 2671-2458 PT Time Calculation (min): 51 min Charge: TE [id number]  Visit#: 21 of 24  Re-eval: 08/08/14 Assessment Diagnosis: difficulty walking  Next MD Visit: Blood work every Monday Prior Therapy: 2014 for back   Authorization: medicare  Authorization Time Period: G code completed 20th visit clinical judgement   Authorization Visit#: 21 of 24   Subjective: Symptoms/Limitations Symptoms: Pt stated he is doing good today, has increased energy today.  Received blood on Tuesday Pain Assessment Currently in Pain?: No/denies  Objective:   Exercise/Treatments Stretches Hip Flexor Stretch: 3 reps;30 seconds Hip Flexor Stretch Limitations: 14" Box Aerobic Stationary Bike: [place] 3 Lv.5 10' Tread Mill: 1.5 mph x 5' Standing Heel Raises: 10 reps;Limitations Heel Raises Limitations: combo with functional squt with yellow ball on 14in step Lateral Step Up: Both;10 reps;Hand Hold: 1;Step Height: 6" Forward Step Up: Both;10 reps;Hand Hold: 1;Step Height: 6" Gait Training: agility ladder x 15 minutes multiple direciotns for coordination    Physical Therapy Assessment and Plan PT Assessment and Plan Clinical Impression Statement: Began agility ladder activities to improve coordination with gait, min assistance required.  Pt improving strength, balance and activity tolerance.  Increased difficulty with Nustep and session complete away from parallel bars to improve confidence with balance. PT Plan: Continue with current PT POC to imrpove LE strengthening with deep squats, lunges, steps and balance with dynamic balance activities.  Agility ladder for coordination with gait.    Goals PT Short Term Goals PT Short Term Goal 1: Pt to be able to stand for 10 minutes without increased pain to be able to make a sandwich for a meal PT Short  Term Goal 2: Pt to be able to walk for 15 minutes for better health habits.  PT Short Term Goal 3: Pt to walk with 20 degree forward flexion  PT Short Term Goal 3 - Progress: Progressing toward goal PT Long Term Goals PT Long Term Goal 1: I in advance HEP PT Long Term Goal 2: Pt to be able to stand for 20 minutes to socialize Long Term Goal 3: Pt to be comfortable walking inside with a cane Long Term Goal 4: Patient to be able to walk with his walker for 30 minutes at a time PT Long Term Goal 5: forward bent posture to be decreased to only 10 degree forward bend  PT Long Term Goal 6: Pain at the most to be a 3/10 80% of the time = Goal met PT Long Term Goal 7: no falls in the past two weeks = Goal met  Problem List Patient Active Problem List   Diagnosis Date Noted  . Weakness of both legs 05/09/2014  . Poor balance 05/09/2014  . Difficulty in walking(719.7) 05/09/2014  . Chest pain 03/23/2014  . CHF (congestive heart failure) 02/12/2014  . Exertional dyspnea 02/12/2014  . Chest pain of uncertain etiology 50/53/9767  . Diabetes 01/01/2014  . Chronic kidney disease (CKD) stage G3b/A2, moderately decreased glomerular filtration rate (GFR) between 30-44 mL/min/1.73 square meter and albuminuria creatinine ratio between 30-299 mg/g 12/14/2013  . NSTEMI (non-ST elevated myocardial infarction) 11/28/2013  . HCAP (healthcare-associated pneumonia) 11/27/2013  . Community acquired pneumonia 11/27/2013  . Acute on chronic systolic heart failure 34/19/3790  . Other pancytopenia 01/08/2013  . Non-ST elevation MI (NSTEMI) 01/06/2013  . High output heart failure 10/25/2012  . Myelodysplastic syndrome with 5 q  minus 10/17/2012  . History of colonic polyps 02/28/2012  . Anemia 01/10/2012  . Angina effort 01/06/2012  . AVM (arteriovenous malformation) of colon without hemorrhage 01/04/2012  . GI bleeding 10/09/2011  . Long term current use of anticoagulant 12/22/2010  . CAROTID ARTERY STENOSIS  10/16/2010  . DYSLIPIDEMIA 04/11/2009  . CAD 04/11/2009  . Atrial fibrillation 04/11/2009  . PVD 04/11/2009  . OSTEOARTHRITIS 04/11/2009    PT - End of Session Equipment Utilized During Treatment: Gait belt Activity Tolerance: Patient tolerated treatment well General Behavior During Therapy: [facility] for tasks assessed/performed  GP    Aldona Lento 07/25/2014, 12:11 PM

## 2014-07-26 ENCOUNTER — Ambulatory Visit (HOSPITAL_COMMUNITY): Payer: Medicare Other | Admitting: Physical Therapy

## 2014-07-29 ENCOUNTER — Other Ambulatory Visit (HOSPITAL_COMMUNITY): Payer: Self-pay | Admitting: Oncology

## 2014-07-29 ENCOUNTER — Other Ambulatory Visit (HOSPITAL_COMMUNITY): Payer: Self-pay

## 2014-07-29 ENCOUNTER — Telehealth (HOSPITAL_COMMUNITY): Payer: Self-pay

## 2014-07-29 ENCOUNTER — Encounter (HOSPITAL_COMMUNITY): Payer: Medicare Other | Attending: Oncology

## 2014-07-29 DIAGNOSIS — D46C Myelodysplastic syndrome with isolated del(5q) chromosomal abnormality: Secondary | ICD-10-CM

## 2014-07-29 LAB — CBC WITH DIFFERENTIAL/PLATELET
BASOS PCT: 2 % — AB (ref 0–1)
Basophils Absolute: 0.1 10*3/uL (ref 0.0–0.1)
Eosinophils Absolute: 0.5 10*3/uL (ref 0.0–0.7)
Eosinophils Relative: 10 % — ABNORMAL HIGH (ref 0–5)
HCT: 22.6 % — ABNORMAL LOW (ref 39.0–52.0)
Hemoglobin: 7.7 g/dL — ABNORMAL LOW (ref 13.0–17.0)
Lymphocytes Relative: 36 % (ref 12–46)
Lymphs Abs: 1.7 10*3/uL (ref 0.7–4.0)
MCH: 30.8 pg (ref 26.0–34.0)
MCHC: 34.1 g/dL (ref 30.0–36.0)
MCV: 90.4 fL (ref 78.0–100.0)
MONO ABS: 0.2 10*3/uL (ref 0.1–1.0)
Monocytes Relative: 5 % (ref 3–12)
NEUTROS PCT: 46 % (ref 43–77)
Neutro Abs: 2.1 10*3/uL (ref 1.7–7.7)
Platelets: 99 10*3/uL — ABNORMAL LOW (ref 150–400)
RBC: 2.5 MIL/uL — ABNORMAL LOW (ref 4.22–5.81)
RDW: 18.9 % — ABNORMAL HIGH (ref 11.5–15.5)
WBC: 4.6 10*3/uL (ref 4.0–10.5)

## 2014-07-29 LAB — VITAMIN B12: VITAMIN B 12: 530 pg/mL (ref 211–911)

## 2014-07-29 LAB — PREPARE RBC (CROSSMATCH)

## 2014-07-29 NOTE — Progress Notes (Signed)
LABS FOR CBCD,B12

## 2014-07-29 NOTE — Telephone Encounter (Signed)
-----   Message from Baird Cancer, PA-C sent at 07/29/2014  1:02 PM EST ----- Set-up for PRBC transfusion.  Orders placed

## 2014-07-29 NOTE — Telephone Encounter (Signed)
Call back confirmation received regarding appointment for blood transfusion tomorrow am @ 9:45am

## 2014-07-30 ENCOUNTER — Ambulatory Visit (HOSPITAL_COMMUNITY)
Admission: RE | Admit: 2014-07-30 | Payer: Medicare Other | Source: Ambulatory Visit | Attending: Oncology | Admitting: Oncology

## 2014-07-30 ENCOUNTER — Encounter (HOSPITAL_COMMUNITY): Payer: Self-pay

## 2014-07-30 ENCOUNTER — Encounter (HOSPITAL_BASED_OUTPATIENT_CLINIC_OR_DEPARTMENT_OTHER): Payer: Medicare Other

## 2014-07-30 DIAGNOSIS — D46C Myelodysplastic syndrome with isolated del(5q) chromosomal abnormality: Secondary | ICD-10-CM

## 2014-07-30 MED ORDER — SODIUM CHLORIDE 0.9 % IJ SOLN: 3.0000 mL | INTRAMUSCULAR | Status: AC | PRN

## 2014-07-30 MED ORDER — SODIUM CHLORIDE 0.9 % IV SOLN
250.0000 mL | Freq: Once | INTRAVENOUS | Status: AC
  Administered 2014-07-30: 11:00:00 via INTRAVENOUS

## 2014-07-30 MED ORDER — FUROSEMIDE 10 MG/ML IJ SOLN
20.0000 mg | Freq: Once | INTRAMUSCULAR | Status: AC
  Administered 2014-07-30: 20 mg via INTRAVENOUS
  Filled 2014-07-30: qty 2

## 2014-07-30 MED ORDER — DIPHENHYDRAMINE HCL 25 MG PO CAPS
25.0000 mg | ORAL_CAPSULE | Freq: Once | ORAL | Status: AC
  Administered 2014-07-30: 25 mg via ORAL
  Filled 2014-07-30: qty 1

## 2014-07-30 MED ORDER — ACETAMINOPHEN 325 MG PO TABS
650.0000 mg | ORAL_TABLET | Freq: Once | ORAL | Status: AC
  Administered 2014-07-30: 650 mg via ORAL
  Filled 2014-07-30: qty 2

## 2014-07-30 NOTE — Patient Instructions (Signed)
[place] Discharge Instructions  RECOMMENDATIONS MADE BY THE CONSULTANT AND ANY TEST RESULTS WILL BE SENT TO YOUR REFERRING PHYSICIAN.  Today you received 2 units of packed red blood cells. Please follow up as scheduled.  Please call for any questions or concerns.   Thank you for choosing [facility] to provide your oncology and hematology care.  To afford each patient quality time with our providers, please arrive at least 15 minutes before your scheduled appointment time.  With your help, our goal is to use those 15 minutes to complete the necessary work-up to ensure our physicians have the information they need to help with your evaluation and healthcare recommendations.    Effective January 1st, 2014, we ask that you re-schedule your appointment with our physicians should you arrive 10 or more minutes late for your appointment.  We strive to give you quality time with our providers, and arriving late affects you and other patients whose appointments are after yours.    Again, thank you for choosing [facility].  Our hope is that these requests will decrease the amount of time that you wait before being seen by our physicians.       _____________________________________________________________  Should you have questions after your visit to [facility], please contact our office at [phone] between the hours of 8:30 a.m. and 4:30 p.m.  Voicemails left after 4:30 p.m. will not be returned until the following business day.  For prescription refill requests, have your pharmacy contact our office with your prescription refill request.    _______________________________________________________________  We hope that we have given you very good care.  You may receive a patient satisfaction survey in the mail, please complete it and return it as soon as possible.  We value your  feedback!  _______________________________________________________________  Have you asked about our STAR program?  STAR stands for Survivorship Training and Rehabilitation, and this is a nationally recognized cancer care program that focuses on survivorship and rehabilitation.  Cancer and cancer treatments may cause problems, such as, pain, making you feel tired and keeping you from doing the things that you need or want to do. Cancer rehabilitation can help. Our goal is to reduce these troubling effects and help you have the best quality of life possible.  You may receive a survey from a nurse that asks questions about your current state of health.  Based on the survey results, all eligible patients will be referred to the [facility] program for an evaluation so we can better serve you!  A frequently asked questions sheet is available upon request.

## 2014-07-30 NOTE — Progress Notes (Unsigned)
Patient tolerated 2 units of packed red blood cells well.

## 2014-07-31 LAB — TYPE AND SCREEN
ABO/RH(D): A NEG
Antibody Screen: NEGATIVE
UNIT DIVISION: 0
Unit division: 0

## 2014-08-01 ENCOUNTER — Ambulatory Visit (HOSPITAL_COMMUNITY)
Admission: RE | Admit: 2014-08-01 | Discharge: 2014-08-01 | Disposition: A | Discharge status: Home / Self Care | Payer: Medicare Other | Source: Ambulatory Visit | Attending: Oncology | Admitting: Oncology

## 2014-08-01 DIAGNOSIS — R262 Difficulty in walking, not elsewhere classified: Secondary | ICD-10-CM | POA: Insufficient documentation

## 2014-08-01 DIAGNOSIS — I10 Essential (primary) hypertension: Secondary | ICD-10-CM | POA: Diagnosis not present

## 2014-08-01 DIAGNOSIS — R29898 Other symptoms and signs involving the musculoskeletal system: Secondary | ICD-10-CM

## 2014-08-01 DIAGNOSIS — R2689 Other abnormalities of gait and mobility: Secondary | ICD-10-CM

## 2014-08-01 DIAGNOSIS — J449 Chronic obstructive pulmonary disease, unspecified: Secondary | ICD-10-CM | POA: Insufficient documentation

## 2014-08-01 DIAGNOSIS — Z951 Presence of aortocoronary bypass graft: Secondary | ICD-10-CM | POA: Diagnosis not present

## 2014-08-01 DIAGNOSIS — Z9181 History of falling: Secondary | ICD-10-CM | POA: Diagnosis not present

## 2014-08-01 DIAGNOSIS — Z5189 Encounter for other specified aftercare: Secondary | ICD-10-CM | POA: Diagnosis present

## 2014-08-01 NOTE — Therapy (Signed)
Physical Therapy Treatment  Patient Details  Name: [name] MRN: [id number] Date of Birth: 10/16/1931  Encounter Date: 08/01/2014      PT End of Session - 08/01/14 1606    Visit Number 22   Number of Visits 24   Date for PT Re-Evaluation 08/08/14   PT Start Time 1435   PT Stop Time 1518   PT Time Calculation (min) 43 min   Equipment Utilized During Treatment Gait belt   Activity Tolerance Patient tolerated treatment well      Past Medical History  Diagnosis Date  . Essential hypertension, benign   . COPD (chronic obstructive pulmonary disease)   . Coronary atherosclerosis of native coronary artery     Multivessel status post CABG 1996  . Arthritis   . Atrial fibrillation     Coumadin stopped 12/2011 due to anemia  . Dysphagia   . Anemia     Requiring blood transfusions  . Carotid stenosis     Dr Scot Dock   . Diabetes mellitus, type 2   . Mixed hyperlipidemia     Statin intolerant  . Hematuria     Felt related to Foley insertion  . Cardiomyopathy     LVEF 45-50%  . Melena     EGD & Colonoscopy 09/2011 revealed gastritis, erosions, scars, diverticula, 8 colon polyps (largest 1.6cm, not removed 2/2 anticoagulation), small AVM in transverse colon  . History of tobacco abuse     Quit 1992  . Anxiety   . GERD (gastroesophageal reflux disease)   . Myocardial infarction   . Thrombocytopenia   . Myelodysplastic syndrome with 5 q minus 10/17/2012    On Revlimid 5 mg daily 21 days on and 7 days off  . Aortic stenosis     Mild  . CKD (chronic kidney disease) stage 3, GFR 30-59 ml/min   . Pneumonia 11/2013    history of    Past Surgical History  Procedure Laterality Date  . Knee surgery  1960    Right knee cartilage  . Rotator cuff repair  1990    right   . Inguinal hernia repair  2000 and 2010    left  . Cholecystectomy  05/2010    Lap chole with biologic mesh repair/reinforcement by  Dr Rise Patience  . Back surgery  02/2006    ruptured disk,  post op diskitis  infection requiring prolonged hospital stay and  surgical I & D  . Tonsillectomy    . Esophagogastroduodenoscopy  10/08/2011    Procedure: ESOPHAGOGASTRODUODENOSCOPY (EGD);  Surgeon: [name], MD;  [facility];  Service: Endoscopy;  Laterality: N/A;  . Colonoscopy  10/08/2011    Procedure: COLONOSCOPY;  Surgeon: [name], MD;  [facility];  Service: Endoscopy;  Laterality: N/A;  . Cataract extraction, bilateral    . Peg placement  07/2006    placed due to prolonged infection, poor po intake, malnutrition during several week hospital stay resulting from ruptured disc that became infected following surgery  . Colonoscopy  01/14/2012    Procedure: COLONOSCOPY;  Surgeon: [name], MD;  [facility]: [facility];  Service: Endoscopy;  Laterality: N/A;  945  . Coronary artery bypass graft  1996    CABG x 4  . Ventral hernia repair    . Colonoscopy  04/20/2012    Procedure: COLONOSCOPY;  Surgeon: [name], MD;  [facility]: [facility];  Service: Endoscopy;  Laterality: N/A;  1030  . Pr  vein bypass graft,aorto-fem-pop  1996  . Spine surgery    . Joint replacement  1960    Right Knee  . Lymph node biopsy  08/15/2012    Procedure: LYMPH NODE BIOPSY;  Surgeon: Scherry Ran, MD;  Location: [facility];  Service: General;  Laterality: Left;  Cervical Lymph Node Bx in Minor Room  . Bone marrow biopsy    . Bone marrow aspiration      There were no vitals taken for this visit.  Visit Diagnosis:  Weakness of both legs  Poor balance  Subjective:  Pt states he is feeling well today and reports no pain.  Pt received 2U packed RBC on Monday (07/30/14).        [place] Adult PT Treatment/Exercise - 08/01/14 1446    Knee/Hip Exercises: Stretches   Hip Flexor Stretch 3 reps;30 seconds   Hip Flexor Stretch Limitations 14" Box   Knee/Hip Exercises: Aerobic   Stationary [place] 4 Lv.5 10' LE only   Knee/Hip Exercises: Standing   Heel Raises  10 reps;Limitations   Heel Raises Limitations combo with functional squt with yellow ball on 14in step   Lateral Step Up Both;10 reps;Hand Hold: 1;Step Height: 6"   Forward Step Up Both;10 reps;Hand Hold: 1;Step Height: 6"   SLS x3 B, 3" max Lt, 5" max Rt            PT Short Term Goals - 08/01/14 1554    PT SHORT TERM GOAL #1   Title Pt to be able to stand for 10 minutes without increased pain to be able to make a sandwich for a meal   Time 4   Period Weeks   Status Achieved   PT SHORT TERM GOAL #2   Title Pt to be able to walk for 15 minutes for better health habits   Time 4   Period Weeks   Status Achieved   PT SHORT TERM GOAL #3   Title Pt to walk with 20 degree forward flexion     Time 4   Period Weeks   Status On-going          PT Long Term Goals - 08/01/14 1556    PT LONG TERM GOAL #1   Title Pt to be able to stand for 20 minutes to socialize   Time 8   Period Weeks   Status On-going   PT LONG TERM GOAL #2   Title Pt to be comfortable walking inside with a cane   Time 8   Period Weeks   PT LONG TERM GOAL #3   Title Patient to be able to walk with his walker for 30 minutes at a time   Time 8   Period Weeks   Status On-going   PT LONG TERM GOAL #4   Title forward bent posture to be decreased to only 10 degree forward bend     Time 8   Period Weeks   Status On-going          Plan - 08/01/14 1600    Clinical Impression Statement continued to focus on increasing strength, balance and activity tolerance.  Noted weakness in Rt LE today with forward step ups and unable to complete without UE assist.  patient able to ambulate around clinic without AD.  Overal increaseing stability and upright posturing noted with ambulation.   PT Plan Continue with current PT POC to imrpove LE strengthening with deep squats, lunges, steps and balance with dynamic  balance activities. Resume Agility ladder for coordination with gait.        Problem List Patient Active  Problem List   Diagnosis Date Noted  . Weakness of both legs 05/09/2014  . Poor balance 05/09/2014  . Difficulty in walking(719.7) 05/09/2014  . Chest pain 03/23/2014  . CHF (congestive heart failure) 02/12/2014  . Exertional dyspnea 02/12/2014  . Chest pain of uncertain etiology 89/37/3428  . Diabetes 01/01/2014  . Chronic kidney disease (CKD) stage G3b/A2, moderately decreased glomerular filtration rate (GFR) between 30-44 mL/min/1.73 square meter and albuminuria creatinine ratio between 30-299 mg/g 12/14/2013  . NSTEMI (non-ST elevated myocardial infarction) 11/28/2013  . HCAP (healthcare-associated pneumonia) 11/27/2013  . Community acquired pneumonia 11/27/2013  . Acute on chronic systolic heart failure 76/81/1572  . Other pancytopenia 01/08/2013  . Non-ST elevation MI (NSTEMI) 01/06/2013  . High output heart failure 10/25/2012  . Myelodysplastic syndrome with 5 q minus 10/17/2012  . History of colonic polyps 02/28/2012  . Anemia 01/10/2012  . Angina effort 01/06/2012  . AVM (arteriovenous malformation) of colon without hemorrhage 01/04/2012  . GI bleeding 10/09/2011  . Long term current use of anticoagulant 12/22/2010  . CAROTID ARTERY STENOSIS 10/16/2010  . DYSLIPIDEMIA 04/11/2009  . CAD 04/11/2009  . Atrial fibrillation 04/11/2009  . PVD 04/11/2009  . OSTEOARTHRITIS 04/11/2009      Teena Irani, PTA/CLT 08/01/2014, 4:10 PM

## 2014-08-05 ENCOUNTER — Other Ambulatory Visit (HOSPITAL_COMMUNITY): Payer: Medicare Other

## 2014-08-05 ENCOUNTER — Other Ambulatory Visit (HOSPITAL_COMMUNITY): Payer: Self-pay | Admitting: Oncology

## 2014-08-05 ENCOUNTER — Ambulatory Visit (HOSPITAL_COMMUNITY): Payer: Medicare Other | Admitting: Physical Therapy

## 2014-08-05 ENCOUNTER — Encounter (HOSPITAL_BASED_OUTPATIENT_CLINIC_OR_DEPARTMENT_OTHER): Payer: Medicare Other

## 2014-08-05 ENCOUNTER — Ambulatory Visit (HOSPITAL_COMMUNITY)
Admission: RE | Admit: 2014-08-05 | Discharge: 2014-08-05 | Disposition: A | Discharge status: Home / Self Care | Payer: Medicare Other | Source: Ambulatory Visit | Attending: Oncology | Admitting: Oncology

## 2014-08-05 DIAGNOSIS — Z5189 Encounter for other specified aftercare: Secondary | ICD-10-CM | POA: Diagnosis not present

## 2014-08-05 DIAGNOSIS — D46C Myelodysplastic syndrome with isolated del(5q) chromosomal abnormality: Secondary | ICD-10-CM | POA: Diagnosis not present

## 2014-08-05 DIAGNOSIS — R29898 Other symptoms and signs involving the musculoskeletal system: Secondary | ICD-10-CM

## 2014-08-05 DIAGNOSIS — R2689 Other abnormalities of gait and mobility: Secondary | ICD-10-CM

## 2014-08-05 LAB — CBC WITH DIFFERENTIAL/PLATELET
BASOS ABS: 0.1 10*3/uL (ref 0.0–0.1)
BASOS PCT: 2 % — AB (ref 0–1)
Eosinophils Absolute: 0.5 10*3/uL (ref 0.0–0.7)
Eosinophils Relative: 13 % — ABNORMAL HIGH (ref 0–5)
HEMATOCRIT: 26.5 % — AB (ref 39.0–52.0)
Hemoglobin: 8.9 g/dL — ABNORMAL LOW (ref 13.0–17.0)
Lymphocytes Relative: 35 % (ref 12–46)
Lymphs Abs: 1.4 10*3/uL (ref 0.7–4.0)
MCH: 30.1 pg (ref 26.0–34.0)
MCHC: 33.6 g/dL (ref 30.0–36.0)
MCV: 89.5 fL (ref 78.0–100.0)
Monocytes Absolute: 0.2 10*3/uL (ref 0.1–1.0)
Monocytes Relative: 5 % (ref 3–12)
NEUTROS ABS: 1.8 10*3/uL (ref 1.7–7.7)
Neutrophils Relative %: 46 % (ref 43–77)
PLATELETS: 107 10*3/uL — AB (ref 150–400)
RBC: 2.96 MIL/uL — ABNORMAL LOW (ref 4.22–5.81)
RDW: 17.9 % — AB (ref 11.5–15.5)
WBC: 3.9 10*3/uL — AB (ref 4.0–10.5)

## 2014-08-05 LAB — PREPARE RBC (CROSSMATCH)

## 2014-08-05 NOTE — Therapy (Signed)
Physical Therapy Treatment  Patient Details  Name: [name] MRN: [id number] Date of Birth: 06-06-1932  Encounter Date: 08/05/2014      PT End of Session - 08/05/14 1511    Visit Number 23   Number of Visits 24   Date for PT Re-Evaluation 08/08/14   Authorization Type medicare   Authorization - Visit Number 23   Authorization - Number of Visits 24   PT Start Time 1435   PT Stop Time 1520   PT Time Calculation (min) 45 min   Equipment Utilized During Treatment Gait belt   Activity Tolerance Patient tolerated treatment well      Past Medical History  Diagnosis Date  . Essential hypertension, benign   . COPD (chronic obstructive pulmonary disease)   . Coronary atherosclerosis of native coronary artery     Multivessel status post CABG 1996  . Arthritis   . Atrial fibrillation     Coumadin stopped 12/2011 due to anemia  . Dysphagia   . Anemia     Requiring blood transfusions  . Carotid stenosis     Dr Edilia Bo   . Diabetes mellitus, type 2   . Mixed hyperlipidemia     Statin intolerant  . Hematuria     Felt related to Foley insertion  . Cardiomyopathy     LVEF 45-50%  . Melena     EGD & Colonoscopy 09/2011 revealed gastritis, erosions, scars, diverticula, 8 colon polyps (largest 1.6cm, not removed 2/2 anticoagulation), small AVM in transverse colon  . History of tobacco abuse     Quit 1992  . Anxiety   . GERD (gastroesophageal reflux disease)   . Myocardial infarction   . Thrombocytopenia   . Myelodysplastic syndrome with 5 q minus 10/17/2012    On Revlimid 5 mg daily 21 days on and 7 days off  . Aortic stenosis     Mild  . CKD (chronic kidney disease) stage 3, GFR 30-59 ml/min   . Pneumonia 11/2013    history of    Past Surgical History  Procedure Laterality Date  . Knee surgery  1960    Right knee cartilage  . Rotator cuff repair  1990    right   . Inguinal hernia repair  2000 and 2010    left  . Cholecystectomy  05/2010    Lap chole with biologic  mesh repair/reinforcement by  Dr Zachery Dakins  . Back surgery  02/2006    ruptured disk,  post op diskitis infection requiring prolonged hospital stay and  surgical I & D  . Tonsillectomy    . Esophagogastroduodenoscopy  10/08/2011    Procedure: ESOPHAGOGASTRODUODENOSCOPY (EGD);  Surgeon: Malissa Hippo, MD;  [facility];  Service: Endoscopy;  Laterality: N/A;  . Colonoscopy  10/08/2011    Procedure: COLONOSCOPY;  Surgeon: Malissa Hippo, MD;  [facility];  Service: Endoscopy;  Laterality: N/A;  . Cataract extraction, bilateral    . Peg placement  07/2006    placed due to prolonged infection, poor po intake, malnutrition during several week hospital stay resulting from ruptured disc that became infected following surgery  . Colonoscopy  01/14/2012    Procedure: COLONOSCOPY;  Surgeon: Malissa Hippo, MD;  [facility];  Service: Endoscopy;  Laterality: N/A;  945  . Coronary artery bypass graft  1996    CABG x 4  . Ventral hernia repair    . Colonoscopy  04/20/2012    Procedure: COLONOSCOPY;  Surgeon:  [name], MD;  [facility];  Service: Endoscopy;  Laterality: N/A;  1030  . Pr vein bypass graft,aorto-fem-pop  1996  . Spine surgery    . Joint replacement  1960    Right Knee  . Lymph node biopsy  08/15/2012    Procedure: LYMPH NODE BIOPSY;  Surgeon: Scherry Ran, MD;  Location: [facility];  Service: General;  Laterality: Left;  Cervical Lymph Node Bx in Minor Room  . Bone marrow biopsy    . Bone marrow aspiration      There were no vitals taken for this visit.  Visit Diagnosis:  Weakness of both legs  Poor balance         [facility] Adult PT Treatment/Exercise - 08/05/14 1455    Lumbar Exercises: Stretches   Hip Flexor Stretch 3 reps;30 seconds;Limitations   Hip Flexor Stretch Limitations 14in step   Knee/Hip Exercises: Aerobic   Stationary [place] 4 Lv.5 10' LE only   Knee/Hip Exercises: Standing   Heel Raises  10 reps;Limitations   Heel Raises Limitations combo with functional squt with yellow ball on 14in step   Lateral Step Up Both;10 reps;Hand Hold: 1;Step Height: 6"   Forward Step Up Both;10 reps;Hand Hold: 1;Step Height: 6"   SLS x3 B, 3" max Lt, 12"" max Rt 10"   Gait Training agility ladder x 10 minutes multiple direciotns for coordination           Plan - 08/05/14 1513    Clinical Impression Statement continued weakness noted with Rt quadricep completing forward step ups.  Worked more on balance today.  Able to complete agility ladder coordination/balance actvities without AD and verbal cues.    Pt with improved SLS today bilaterally.  Completed nustep at end of session to increase activity tolerance.   PT Plan Continue with current PT POC to imrpove LE strengthening with deep squats, lunges, steps and balance with dynamic balance activities. Resume Agility ladder for coordination with gait.        Problem List Patient Active Problem List   Diagnosis Date Noted  . Weakness of both legs 05/09/2014  . Poor balance 05/09/2014  . Difficulty in walking(719.7) 05/09/2014  . Chest pain 03/23/2014  . CHF (congestive heart failure) 02/12/2014  . Exertional dyspnea 02/12/2014  . Chest pain of uncertain etiology 47/82/9562  . Diabetes 01/01/2014  . Chronic kidney disease (CKD) stage G3b/A2, moderately decreased glomerular filtration rate (GFR) between 30-44 mL/min/1.73 square meter and albuminuria creatinine ratio between 30-299 mg/g 12/14/2013  . NSTEMI (non-ST elevated myocardial infarction) 11/28/2013  . HCAP (healthcare-associated pneumonia) 11/27/2013  . Community acquired pneumonia 11/27/2013  . Acute on chronic systolic heart failure 13/04/6577  . Other pancytopenia 01/08/2013  . Non-ST elevation MI (NSTEMI) 01/06/2013  . High output heart failure 10/25/2012  . Myelodysplastic syndrome with 5 q minus 10/17/2012  . History of colonic polyps 02/28/2012  . Anemia 01/10/2012  .  Angina effort 01/06/2012  . AVM (arteriovenous malformation) of colon without hemorrhage 01/04/2012  . GI bleeding 10/09/2011  . Long term current use of anticoagulant 12/22/2010  . CAROTID ARTERY STENOSIS 10/16/2010  . DYSLIPIDEMIA 04/11/2009  . CAD 04/11/2009  . Atrial fibrillation 04/11/2009  . PVD 04/11/2009  . OSTEOARTHRITIS 04/11/2009    Teena Irani, PTA/CLT 08/05/2014, 3:16 PM

## 2014-08-05 NOTE — Progress Notes (Signed)
Patient and wife notified of need for blood transfusion.  Patient scheduled 08/07/14 of this week.

## 2014-08-05 NOTE — Addendum Note (Signed)
Addended by: Jeannette How D on: 08/05/2014 11:26 AM   Modules accepted: Orders

## 2014-08-05 NOTE — Progress Notes (Addendum)
LABS FOR CBCD. Added tyscr prbc

## 2014-08-07 ENCOUNTER — Encounter (HOSPITAL_COMMUNITY): Payer: Self-pay

## 2014-08-07 ENCOUNTER — Encounter (HOSPITAL_BASED_OUTPATIENT_CLINIC_OR_DEPARTMENT_OTHER): Payer: Medicare Other

## 2014-08-07 DIAGNOSIS — D46C Myelodysplastic syndrome with isolated del(5q) chromosomal abnormality: Secondary | ICD-10-CM

## 2014-08-07 MED ORDER — SODIUM CHLORIDE 0.9 % IV SOLN
250.0000 mL | Freq: Once | INTRAVENOUS | Status: AC
  Administered 2014-08-07: 250 mL via INTRAVENOUS

## 2014-08-07 MED ORDER — DIPHENHYDRAMINE HCL 25 MG PO CAPS
25.0000 mg | ORAL_CAPSULE | Freq: Once | ORAL | Status: AC
  Administered 2014-08-07: 25 mg via ORAL

## 2014-08-07 MED ORDER — ACETAMINOPHEN 325 MG PO TABS
650.0000 mg | ORAL_TABLET | Freq: Once | ORAL | Status: AC
  Administered 2014-08-07: 650 mg via ORAL

## 2014-08-07 MED ORDER — SODIUM CHLORIDE 0.9 % IJ SOLN: 10.0000 mL | INTRAMUSCULAR | Status: DC | PRN

## 2014-08-07 MED ORDER — ACETAMINOPHEN 325 MG PO TABS
ORAL_TABLET | ORAL | Status: AC
  Filled 2014-08-07: qty 2

## 2014-08-07 MED ORDER — FUROSEMIDE 10 MG/ML IJ SOLN: 20.0000 mg | Freq: Once | INTRAMUSCULAR | Status: DC

## 2014-08-07 MED ORDER — DIPHENHYDRAMINE HCL 25 MG PO CAPS
ORAL_CAPSULE | ORAL | Status: AC
  Filled 2014-08-07: qty 1

## 2014-08-07 NOTE — Patient Instructions (Signed)
[place] Discharge Instructions  RECOMMENDATIONS MADE BY THE CONSULTANT AND ANY TEST RESULTS WILL BE SENT TO YOUR REFERRING PHYSICIAN.  You received 2 units of packed red blood cells today Return as scheduled for lab work on 08/12/14 Please call the clinic with any questions or concerns should they arise  Thank you for choosing [place] to provide your oncology and hematology care.  To afford each patient quality time with our providers, please arrive at least 15 minutes before your scheduled appointment time.  With your help, our goal is to use those 15 minutes to complete the necessary work-up to ensure our physicians have the information they need to help with your evaluation and healthcare recommendations.    Effective January 1st, 2014, we ask that you re-schedule your appointment with our physicians should you arrive 10 or more minutes late for your appointment.  We strive to give you quality time with our providers, and arriving late affects you and other patients whose appointments are after yours.    Again, thank you for choosing [facility].  Our hope is that these requests will decrease the amount of time that you wait before being seen by our physicians.       _____________________________________________________________  Should you have questions after your visit to [facility], please contact our office at [phone] between the hours of 8:30 a.m. and 4:30 p.m.  Voicemails left after 4:30 p.m. will not be returned until the following business day.  For prescription refill requests, have your pharmacy contact our office with your prescription refill request.    _______________________________________________________________  We hope that we have given you very good care.  You may receive a patient satisfaction survey in the mail, please complete it and return it as soon as possible.  We value your  feedback!  _______________________________________________________________  Have you asked about our STAR program?  STAR stands for Survivorship Training and Rehabilitation, and this is a nationally recognized cancer care program that focuses on survivorship and rehabilitation.  Cancer and cancer treatments may cause problems, such as, pain, making you feel tired and keeping you from doing the things that you need or want to do. Cancer rehabilitation can help. Our goal is to reduce these troubling effects and help you have the best quality of life possible.  You may receive a survey from a nurse that asks questions about your current state of health.  Based on the survey results, all eligible patients will be referred to the [facility] program for an evaluation so we can better serve you!  A frequently asked questions sheet is available upon request.

## 2014-08-08 ENCOUNTER — Ambulatory Visit (HOSPITAL_COMMUNITY)
Admission: RE | Admit: 2014-08-08 | Discharge: 2014-08-08 | Disposition: A | Discharge status: Home / Self Care | Payer: Medicare Other | Source: Ambulatory Visit | Attending: Oncology | Admitting: Oncology

## 2014-08-08 DIAGNOSIS — R29898 Other symptoms and signs involving the musculoskeletal system: Secondary | ICD-10-CM

## 2014-08-08 DIAGNOSIS — R2689 Other abnormalities of gait and mobility: Secondary | ICD-10-CM

## 2014-08-08 DIAGNOSIS — Z5189 Encounter for other specified aftercare: Secondary | ICD-10-CM | POA: Diagnosis not present

## 2014-08-08 LAB — TYPE AND SCREEN
ABO/RH(D): A NEG
ANTIBODY SCREEN: NEGATIVE
Unit division: 0
Unit division: 0

## 2014-08-08 NOTE — Patient Instructions (Signed)
Bridging   Slowly raise buttocks from floor, keeping stomach tight. Repeat 10-20  times per set. Do 2  sets per session. Do 2 sessions per day.  http://orth.exer.us/1096   HIP: Flexion / KNEE: Extension, Straight Leg Raise   Raise leg, keeping knee straight. Perform slowly.10-20  reps per set, 2 sets per day, 3-4 days per week   Copyright  VHI. All rights reserved.  Straight Leg Raise (Prone)   Abdomen and head supported, keep left knee locked and raise leg at hip. Avoid arching low back. Repeat 10-20  times per set. Do 2  sets per session. Do 2 sessions per day.  http://orth.exer.us/1112   Copyright  VHI. All rights reserved.  FUNCTIONAL MOBILITY: Squat   Stance: shoulder-width on floor. Bend hips and knees. Keep back straight. Do not allow knees to bend past toes. Squeeze glutes and quads to stand. 10-20  reps per set, 2  sets per day, 3-4  days per week  Copyright  VHI. All rights reserved.

## 2014-08-08 NOTE — Therapy (Signed)
Physical Therapy Evaluation  Patient Details  Name: [name] MRN: [id number] Date of Birth: [birth year]  Encounter Date: 08/08/2014      PT End of Session - 08/08/14 1355    Visit Number 24   Number of Visits 28   Date for PT Re-Evaluation 08/21/14   Authorization Type medicare   Authorization - Visit Number 24   Authorization - Number of Visits 32   PT Start Time 4166   PT Stop Time 1445   PT Time Calculation (min) 57 min   Equipment Utilized During Treatment Gait belt   Activity Tolerance Patient tolerated treatment well   Behavior During Therapy WFL for tasks assessed/performed      Past Medical History  Diagnosis Date  . Essential hypertension, benign   . COPD (chronic obstructive pulmonary disease)   . Coronary atherosclerosis of native coronary artery     Multivessel status post CABG 1996  . Arthritis   . Atrial fibrillation     Coumadin stopped 12/2011 due to anemia  . Dysphagia   . Anemia     Requiring blood transfusions  . Carotid stenosis     Dr Scot Dock   . Diabetes mellitus, type 2   . Mixed hyperlipidemia     Statin intolerant  . Hematuria     Felt related to Foley insertion  . Cardiomyopathy     LVEF 45-50%  . Melena     EGD & Colonoscopy 09/2011 revealed gastritis, erosions, scars, diverticula, 8 colon polyps (largest 1.6cm, not removed 2/2 anticoagulation), small AVM in transverse colon  . History of tobacco abuse     Quit 1992  . Anxiety   . GERD (gastroesophageal reflux disease)   . Myocardial infarction   . Thrombocytopenia   . Myelodysplastic syndrome with 5 q minus 10/17/2012    On Revlimid 5 mg daily 21 days on and 7 days off  . Aortic stenosis     Mild  . CKD (chronic kidney disease) stage 3, GFR 30-59 ml/min   . Pneumonia 11/2013    history of  . Blood transfusion without reported diagnosis     Past Surgical History  Procedure Laterality Date  . Knee surgery  1960    Right knee cartilage  . Rotator cuff repair  1990     right   . Inguinal hernia repair  2000 and 2010    left  . Cholecystectomy  05/2010    Lap chole with biologic mesh repair/reinforcement by  Dr Rise Patience  . Back surgery  02/2006    ruptured disk,  post op diskitis infection requiring prolonged hospital stay and  surgical I & D  . Tonsillectomy    . Esophagogastroduodenoscopy  10/08/2011    Procedure: ESOPHAGOGASTRODUODENOSCOPY (EGD);  Surgeon: [name], MD;  [facility];  Service: Endoscopy;  Laterality: N/A;  . Colonoscopy  10/08/2011    Procedure: COLONOSCOPY;  Surgeon: [name], MD;  [facility];  Service: Endoscopy;  Laterality: N/A;  . Cataract extraction, bilateral    . Peg placement  07/2006    placed due to prolonged infection, poor po intake, malnutrition during several week hospital stay resulting from ruptured disc that became infected following surgery  . Colonoscopy  01/14/2012    Procedure: COLONOSCOPY;  Surgeon: [name], MD;  [facility];  Service: Endoscopy;  Laterality: N/A;  945  . Coronary artery bypass graft  1996    CABG x 4  .  Ventral hernia repair    . Colonoscopy  04/20/2012    Procedure: COLONOSCOPY;  Surgeon: [name], MD;  [facility];  Service: Endoscopy;  Laterality: N/A;  1030  . Pr vein bypass graft,aorto-fem-pop  1996  . Spine surgery    . Joint replacement  1960    Right Knee  . Lymph node biopsy  08/15/2012    Procedure: LYMPH NODE BIOPSY;  Surgeon: Scherry Ran, MD;  Location: [facility];  Service: General;  Laterality: Left;  Cervical Lymph Node Bx in Minor Room  . Bone marrow biopsy    . Bone marrow aspiration      There were no vitals taken for this visit.  Visit Diagnosis:  Weakness of both legs  Poor balance      Subjective Assessment - 08/08/14 1352    Symptoms Pt stated he is feeling 40% better since he began therapy.  Stated complaince with HEP.  Stated his biggest problems currently are his balance with 2 fals  against wall this week while walking with his caneand find motor control like buttoning clothes.  Pt does wish to be referred to OT to help with buttoning.  Pt also has problems with LBP requiring rest breaks while washing dishes.   How long can you sit comfortably? able to sit without problem    How long can you stand comfortably? Unsure how long he can stand for thinking 25 minutes Able to stand for 5-10 minutes)   How long can you walk comfortably? Able to walk with RW for an hour, walks very minimal times with cane (Was able to walk for 45 minutes through [employer] with RW )   Currently in Pain? Yes   Pain Score 2    Pain Location Leg   Pain Orientation Right          [facility] PT Assessment - 08/08/14 0001    Assessment   Medical Diagnosis Difficutly walking   Next MD Visit Blood work every Monday; Luan Pulling unscheduled    Prior Therapy 2014 for back    Strength   Right Hip Flexion 4/5  was 4/5   Right Hip Extension 3-/5  was 2+/5   Right Hip ABduction --  4+/5 was 4-/5   Right Hip ADduction --  was 4/5   Left Hip Flexion 4/5  was 4/5   Left Hip Extension 3-/5  was 3-/5   Left Hip ABduction 5/5  was 4/5   Left Hip ADduction --  4+/5 was 4/5   Right Knee Flexion 3+/5  was 4/5   Right Knee Extension --  was 4/5   Left Knee Flexion 4/5  was 4/5   Left Knee Extension 4/5  was 4/5   Right Ankle Dorsiflexion 4/5  was 4/5   Right Ankle Plantar Flexion 4/5  was 4/5   Left Ankle Dorsiflexion 4/5  was 4/5   Left Ankle Plantar Flexion 4/5  was 4/5          [facility] Adult PT Treatment/Exercise - 08/08/14 0001    Lumbar Exercises: Stretches   Active Hamstring Stretch 3 reps;30 seconds;Limitations   Active Hamstring Stretch Limitations 14in box   Hip Flexor Stretch 3 reps;30 seconds;Limitations   Hip Flexor Stretch Limitations 14in step   Knee/Hip Exercises: Standing   Heel Raises 10 reps;Limitations   Heel Raises Limitations combo with functional squt with yellow ball on  14in step   Functional Squat 10 reps   Knee/Hip Exercises: Supine  Bridges 20 reps   Straight Leg Raises 10 reps   Knee/Hip Exercises: Sidelying   Hip ABduction 10 reps   Knee/Hip Exercises: Prone   Hip Extension 10 reps          PT Education - 08/08/14 1451    Education provided Yes   Education Details Emphasized importance of completeing HEP for LE strengthening and posture to reduce LBP   Person(s) Educated Patient   Methods Explanation;Demonstration;Handout   Comprehension Verbalized understanding;Returned demonstration          PT Short Term Goals - 08/08/14 1356    PT SHORT TERM GOAL #1   Title Pt to be able to stand for 10 minutes without increased pain to be able to make a sandwich for a meal   Status Achieved   PT SHORT TERM GOAL #2   Title Pt to be able to walk for 15 minutes for better health habits   Status Achieved   PT SHORT TERM GOAL #3   Title Pt to walk with 20 degree forward flexion     Status Achieved          PT Long Term Goals - 08/08/14 1356    PT LONG TERM GOAL #1   Title Pt to be able to stand for 20 minutes to socialize   Status Achieved   PT LONG TERM GOAL #2   Title Pt to be comfortable walking inside with a cane   Status On-going   PT LONG TERM GOAL #3   Title Patient to be able to walk with his walker for 30 minutes at a time   Status Achieved   PT LONG TERM GOAL #4   Title forward bent posture to be decreased to only 10 degree forward bend     Status On-going          Plan - 08/08/14 1455    Clinical Impression Statement Reassessment complete with the followoing findings:  Pt has met 3/3 STGs and 2/4 LTGs and progressing towards the others.  Pt reports compliance with HEP 3-4 times a week and stated he has increased ability to walk with RW.  Pt stated he continues to have increased difficuty with balance activitis and stated he fell against wall twice this week when trying to walk with cane at home.  Pt continues to ambulate  with forward flexed posture at 10 degrees, able to demosntrate 0 degree forward trunk flexion but requires cueing to improve posture due to weakness.  LE strength is improving slowly.   PT Plan Recommend continuing OPPT for 2 times a week for 2 more weeks to improve confidence and balance with SPC gait.  Improve glut strength and stair training.  Resume agility ladder for coordination with gait.  Encourage pt to join [employer] to promote I of fitness.  Referral sent today for OT to assist pt with fine motor coordination to increase ease with functional abilities.         Problem List Patient Active Problem List   Diagnosis Date Noted  . Weakness of both legs 05/09/2014  . Poor balance 05/09/2014  . Difficulty in walking(719.7) 05/09/2014  . Chest pain 03/23/2014  . CHF (congestive heart failure) 02/12/2014  . Exertional dyspnea 02/12/2014  . Chest pain of uncertain etiology 02/12/2014  . Diabetes 01/01/2014  . Chronic kidney disease (CKD) stage G3b/A2, moderately decreased glomerular filtration rate (GFR) between 30-44 mL/min/1.73 square meter and albuminuria creatinine ratio between 30-299 mg/g 12/14/2013  . NSTEMI (non-ST  elevated myocardial infarction) 11/28/2013  . HCAP (healthcare-associated pneumonia) 11/27/2013  . Community acquired pneumonia 11/27/2013  . Acute on chronic systolic heart failure 46/21/9471  . Other pancytopenia 01/08/2013  . Non-ST elevation MI (NSTEMI) 01/06/2013  . High output heart failure 10/25/2012  . Myelodysplastic syndrome with 5 q minus 10/17/2012  . History of colonic polyps 02/28/2012  . Anemia 01/10/2012  . Angina effort 01/06/2012  . AVM (arteriovenous malformation) of colon without hemorrhage 01/04/2012  . GI bleeding 10/09/2011  . Long term current use of anticoagulant 12/22/2010  . CAROTID ARTERY STENOSIS 10/16/2010  . DYSLIPIDEMIA 04/11/2009  . CAD 04/11/2009  . Atrial fibrillation 04/11/2009  . PVD 04/11/2009  . OSTEOARTHRITIS 04/11/2009    Aldona Lento, PTA Aldona Lento 08/08/2014, 3:11 PM  Azucena Freed PT

## 2014-08-12 ENCOUNTER — Ambulatory Visit (HOSPITAL_COMMUNITY)
Admission: RE | Admit: 2014-08-12 | Discharge: 2014-08-12 | Disposition: A | Discharge status: Home / Self Care | Payer: Medicare Other | Source: Ambulatory Visit | Attending: Oncology | Admitting: Oncology

## 2014-08-12 ENCOUNTER — Encounter (HOSPITAL_COMMUNITY): Payer: Medicare Other | Attending: Hematology

## 2014-08-12 DIAGNOSIS — D46C Myelodysplastic syndrome with isolated del(5q) chromosomal abnormality: Secondary | ICD-10-CM | POA: Insufficient documentation

## 2014-08-12 DIAGNOSIS — Z5189 Encounter for other specified aftercare: Secondary | ICD-10-CM | POA: Diagnosis not present

## 2014-08-12 DIAGNOSIS — R29898 Other symptoms and signs involving the musculoskeletal system: Secondary | ICD-10-CM

## 2014-08-12 DIAGNOSIS — R2689 Other abnormalities of gait and mobility: Secondary | ICD-10-CM

## 2014-08-12 LAB — CBC WITH DIFFERENTIAL/PLATELET
BASOS ABS: 0.1 10*3/uL (ref 0.0–0.1)
BASOS PCT: 2 % — AB (ref 0–1)
Eosinophils Absolute: 0.7 10*3/uL (ref 0.0–0.7)
Eosinophils Relative: 18 % — ABNORMAL HIGH (ref 0–5)
HCT: 28.4 % — ABNORMAL LOW (ref 39.0–52.0)
HEMOGLOBIN: 9.5 g/dL — AB (ref 13.0–17.0)
LYMPHS PCT: 33 % (ref 12–46)
Lymphs Abs: 1.3 10*3/uL (ref 0.7–4.0)
MCH: 30 pg (ref 26.0–34.0)
MCHC: 33.5 g/dL (ref 30.0–36.0)
MCV: 89.6 fL (ref 78.0–100.0)
MONOS PCT: 5 % (ref 3–12)
Monocytes Absolute: 0.2 10*3/uL (ref 0.1–1.0)
NEUTROS ABS: 1.7 10*3/uL (ref 1.7–7.7)
Neutrophils Relative %: 43 % (ref 43–77)
Platelets: 101 10*3/uL — ABNORMAL LOW (ref 150–400)
RBC: 3.17 MIL/uL — ABNORMAL LOW (ref 4.22–5.81)
RDW: 17 % — AB (ref 11.5–15.5)
WBC: 4 10*3/uL (ref 4.0–10.5)

## 2014-08-12 NOTE — Therapy (Signed)
Physical Therapy Treatment  Patient Details  Name: [name] MRN: [id number] Date of Birth: 1932-03-16  Encounter Date: 08/12/2014      PT End of Session - 08/12/14 1430    Visit Number 25   Number of Visits 30   Date for PT Re-Evaluation 09/05/14   Authorization Type medicare   Authorization - Visit Number 25   Authorization - Number of Visits 30   PT Start Time 1350   PT Stop Time 1440   PT Time Calculation (min) 50 min   Equipment Utilized During Treatment Gait belt      Past Medical History  Diagnosis Date  . Essential hypertension, benign   . COPD (chronic obstructive pulmonary disease)   . Coronary atherosclerosis of native coronary artery     Multivessel status post CABG 1996  . Arthritis   . Atrial fibrillation     Coumadin stopped 12/2011 due to anemia  . Dysphagia   . Anemia     Requiring blood transfusions  . Carotid stenosis     Dr Scot Dock   . Diabetes mellitus, type 2   . Mixed hyperlipidemia     Statin intolerant  . Hematuria     Felt related to Foley insertion  . Cardiomyopathy     LVEF 45-50%  . Melena     EGD & Colonoscopy 09/2011 revealed gastritis, erosions, scars, diverticula, 8 colon polyps (largest 1.6cm, not removed 2/2 anticoagulation), small AVM in transverse colon  . History of tobacco abuse     Quit 1992  . Anxiety   . GERD (gastroesophageal reflux disease)   . Myocardial infarction   . Thrombocytopenia   . Myelodysplastic syndrome with 5 q minus 10/17/2012    On Revlimid 5 mg daily 21 days on and 7 days off  . Aortic stenosis     Mild  . CKD (chronic kidney disease) stage 3, GFR 30-59 ml/min   . Pneumonia 11/2013    history of  . Blood transfusion without reported diagnosis     Past Surgical History  Procedure Laterality Date  . Knee surgery  1960    Right knee cartilage  . Rotator cuff repair  1990    right   . Inguinal hernia repair  2000 and 2010    left  . Cholecystectomy  05/2010    Lap chole with biologic mesh  repair/reinforcement by  Dr Rise Patience  . Back surgery  02/2006    ruptured disk,  post op diskitis infection requiring prolonged hospital stay and  surgical I & D  . Tonsillectomy    . Esophagogastroduodenoscopy  10/08/2011    Procedure: ESOPHAGOGASTRODUODENOSCOPY (EGD);  Surgeon: [name], MD;  [facility];  Service: Endoscopy;  Laterality: N/A;  . Colonoscopy  10/08/2011    Procedure: COLONOSCOPY;  Surgeon: [name], MD;  [facility];  Service: Endoscopy;  Laterality: N/A;  . Cataract extraction, bilateral    . Peg placement  07/2006    placed due to prolonged infection, poor po intake, malnutrition during several week hospital stay resulting from ruptured disc that became infected following surgery  . Colonoscopy  01/14/2012    Procedure: COLONOSCOPY;  Surgeon: [name], MD;  [facility];  Service: Endoscopy;  Laterality: N/A;  945  . Coronary artery bypass graft  1996    CABG x 4  . Ventral hernia repair    . Colonoscopy  04/20/2012    Procedure: COLONOSCOPY;  Surgeon:  [name], MD;  [facility];  Service: Endoscopy;  Laterality: N/A;  1030  . Pr vein bypass graft,aorto-fem-pop  1996  . Spine surgery    . Joint replacement  1960    Right Knee  . Lymph node biopsy  08/15/2012    Procedure: LYMPH NODE BIOPSY;  Surgeon: Scherry Ran, MD;  Location: [facility];  Service: General;  Laterality: Left;  Cervical Lymph Node Bx in Minor Room  . Bone marrow biopsy    . Bone marrow aspiration      There were no vitals taken for this visit.  Visit Diagnosis:  No diagnosis found.      Subjective Assessment - 08/12/14 1409    Symptoms Pt states he was getting up leaves with his lawnmower today he went to pull out the bag and he fell.  He eventually got himself back up.    Currently in Pain? No/denies            [facility] Adult PT Treatment/Exercise - 08/12/14 1411    Lumbar Exercises: Standing   Other Standing  Lumbar Exercises tandem stance x 2 B; feet together standing x 10    Lumbar Exercises: Seated   Other Seated Lumbar Exercises sit to stand x 10    Knee/Hip Exercises: Aerobic   Stationary [place] hills 4 Lv.5 10' LE only   Knee/Hip Exercises: Standing   SLS SLS x 5 Each     Gait Training with cane x 200'   Other Standing Knee Exercises sumo walking x 2 RT   Other Standing Knee Exercises  steps reciprocal x 2                  Plan - 08/12/14 1431    Clinical Impression Statement Pt continues to have liiatations in balance and gluteal strength but overall is improving. Pty does not seem interested in joining a hym to continue strengthening when therapy is over will need to give high level HEP at discharge.    Rehab Potential Good   PT Frequency 2x / week   PT Duration 3 weeks   PT Next Visit Plan begin retro gait and tandem gt , cone rotation for improved balance    PT Plan Continue to progress with glut strengthening as well as balance         Problem List Patient Active Problem List   Diagnosis Date Noted  . Weakness of both legs 05/09/2014  . Poor balance 05/09/2014  . Difficulty in walking(719.7) 05/09/2014  . Chest pain 03/23/2014  . CHF (congestive heart failure) 02/12/2014  . Exertional dyspnea 02/12/2014  . Chest pain of uncertain etiology 93/23/5573  . Diabetes 01/01/2014  . Chronic kidney disease (CKD) stage G3b/A2, moderately decreased glomerular filtration rate (GFR) between 30-44 mL/min/1.73 square meter and albuminuria creatinine ratio between 30-299 mg/g 12/14/2013  . NSTEMI (non-ST elevated myocardial infarction) 11/28/2013  . HCAP (healthcare-associated pneumonia) 11/27/2013  . Community acquired pneumonia 11/27/2013  . Acute on chronic systolic heart failure 22/10/5425  . Other pancytopenia 01/08/2013  . Non-ST elevation MI (NSTEMI) 01/06/2013  . High output heart failure 10/25/2012  . Myelodysplastic syndrome with 5 q minus 10/17/2012  .  History of colonic polyps 02/28/2012  . Anemia 01/10/2012  . Angina effort 01/06/2012  . AVM (arteriovenous malformation) of colon without hemorrhage 01/04/2012  . GI bleeding 10/09/2011  . Long term current use of anticoagulant 12/22/2010  . CAROTID ARTERY STENOSIS 10/16/2010  . DYSLIPIDEMIA 04/11/2009  .  CAD 04/11/2009  . Atrial fibrillation 04/11/2009  . PVD 04/11/2009  . OSTEOARTHRITIS 04/11/2009         Edenilson Austad,CINDY PT/ CLT 08/12/2014, 3:05 PM

## 2014-08-13 NOTE — Progress Notes (Signed)
Lab draw

## 2014-08-15 ENCOUNTER — Ambulatory Visit (HOSPITAL_COMMUNITY)
Admission: RE | Admit: 2014-08-15 | Discharge: 2014-08-15 | Disposition: A | Discharge status: Home / Self Care | Payer: Medicare Other | Source: Ambulatory Visit | Attending: Oncology | Admitting: Oncology

## 2014-08-15 DIAGNOSIS — R29898 Other symptoms and signs involving the musculoskeletal system: Secondary | ICD-10-CM

## 2014-08-15 DIAGNOSIS — R2689 Other abnormalities of gait and mobility: Secondary | ICD-10-CM

## 2014-08-15 DIAGNOSIS — Z5189 Encounter for other specified aftercare: Secondary | ICD-10-CM | POA: Diagnosis not present

## 2014-08-15 NOTE — Therapy (Signed)
Physical Therapy Treatment  Patient Details  Name: [name] MRN: [id number] Date of Birth: 05/31/1932  Encounter Date: 08/15/2014      PT End of Session - 08/15/14 1347    Visit Number 26   Number of Visits 30   Date for PT Re-Evaluation 09/05/14   Authorization Type medicare   Authorization - Visit Number 26   Authorization - Number of Visits 30   PT Start Time 1305   PT Stop Time 1346   PT Time Calculation (min) 41 min   Equipment Utilized During Treatment Gait belt   Activity Tolerance Patient tolerated treatment well   Behavior During Therapy WFL for tasks assessed/performed      Past Medical History  Diagnosis Date  . Essential hypertension, benign   . COPD (chronic obstructive pulmonary disease)   . Coronary atherosclerosis of native coronary artery     Multivessel status post CABG 1996  . Arthritis   . Atrial fibrillation     Coumadin stopped 12/2011 due to anemia  . Dysphagia   . Anemia     Requiring blood transfusions  . Carotid stenosis     Dr Dickson   . Diabetes mellitus, type 2   . Mixed hyperlipidemia     Statin intolerant  . Hematuria     Felt related to Foley insertion  . Cardiomyopathy     LVEF 45-50%  . Melena     EGD & Colonoscopy 09/2011 revealed gastritis, erosions, scars, diverticula, 8 colon polyps (largest 1.6cm, not removed 2/2 anticoagulation), small AVM in transverse colon  . History of tobacco abuse     Quit 1992  . Anxiety   . GERD (gastroesophageal reflux disease)   . Myocardial infarction   . Thrombocytopenia   . Myelodysplastic syndrome with 5 q minus 10/17/2012    On Revlimid 5 mg daily 21 days on and 7 days off  . Aortic stenosis     Mild  . CKD (chronic kidney disease) stage 3, GFR 30-59 ml/min   . Pneumonia 11/2013    history of  . Blood transfusion without reported diagnosis     Past Surgical History  Procedure Laterality Date  . Knee surgery  1960    Right knee cartilage  . Rotator cuff repair  1990     right   . Inguinal hernia repair  2000 and 2010    left  . Cholecystectomy  05/2010    Lap chole with biologic mesh repair/reinforcement by  Dr Weatherly  . Back surgery  02/2006    ruptured disk,  post op diskitis infection requiring prolonged hospital stay and  surgical I & D  . Tonsillectomy    . Esophagogastroduodenoscopy  10/08/2011    Procedure: ESOPHAGOGASTRODUODENOSCOPY (EGD);  Surgeon: Najeeb U Rehman, MD;  [facility];  Service: Endoscopy;  Laterality: N/A;  . Colonoscopy  10/08/2011    Procedure: COLONOSCOPY;  Surgeon: Najeeb U Rehman, MD;  [facility];  Service: Endoscopy;  Laterality: N/A;  . Cataract extraction, bilateral    . Peg placement  07/2006    placed due to prolonged infection, poor po intake, malnutrition during several week hospital stay resulting from ruptured disc that became infected following surgery  . Colonoscopy  01/14/2012    Procedure: COLONOSCOPY;  Surgeon: Najeeb U Rehman, MD;  [facility];  Service: Endoscopy;  Laterality: N/A;  945  . Coronary artery bypass graft  1996    CABG x 4  .   Ventral hernia repair    . Colonoscopy  04/20/2012    Procedure: COLONOSCOPY;  Surgeon: [name], MD;  [facility]: [facility];  Service: Endoscopy;  Laterality: N/A;  1030  . Pr vein bypass graft,aorto-fem-pop  1996  . Spine surgery    . Joint replacement  1960    Right Knee  . Lymph node biopsy  08/15/2012    Procedure: LYMPH NODE BIOPSY;  Surgeon: Scherry Ran, MD;  Location: [facility];  Service: General;  Laterality: Left;  Cervical Lymph Node Bx in Minor Room  . Bone marrow biopsy    . Bone marrow aspiration      There were no vitals taken for this visit.  Visit Diagnosis:  Weakness of both legs  Poor balance      Subjective Assessment - 08/15/14 1308    Symptoms No falls reported since last visit.     Currently in Pain? No/denies          [facility] PT Assessment - 08/15/14 1306    Assessment   Medical  Diagnosis Difficutly walking   Next MD Visit Blood work every MondayLuan Pulling unscheduled    Prior Therapy 2014 for back           [facility] Adult PT Treatment/Exercise - 08/15/14 1306    Exercises   Exercises Knee/Hip;Lumbar;Balance   Lumbar Exercises: Stretches   Active Hamstring Stretch 3 reps;20 seconds   Active Hamstring Stretch Limitations 14in box, 3 Way   Passive Hamstring Stretch 3 reps;30 seconds   Passive Hamstring Stretch Limitations Gastroc Stretch, Slantboard   Hip Flexor Stretch 3 reps;30 seconds;Limitations   Hip Flexor Stretch Limitations 14in step   Lumbar Exercises: Seated   Other Seated Lumbar Exercises sit to stand 3x5    Knee/Hip Exercises: Aerobic   Stationary [place] hills 4 Lv.6 6'' LE only   Balance Exercises   Sidestepping Limitations 2 laps RT, min guard assist   Tandem Walking 3 round trips   Tandem Walking Limitations VC for posture and head alignement, close CGA-> min assist   Retro Gait Limitations 2 laps RT, min guard assist            PT Short Term Goals - 08/15/14 1349    PT SHORT TERM GOAL #1   Title Pt to be able to stand for 10 minutes without increased pain to be able to make a sandwich for a meal   Status Achieved          PT Long Term Goals - 08/15/14 1349    PT LONG TERM GOAL #2   Title Pt to be comfortable walking inside with a cane   Status On-going   PT LONG TERM GOAL #4   Title forward bent posture to be decreased to only 10 degree forward bend     Status On-going          Plan - 08/15/14 1347    Clinical Impression Statement Progressed program, incorporating increased functional mobility and gait training this session.  VC with all gait training, consistently with posture and head alignement.  Pt was able to complete gait exercises in clinic without use of cane, though required min guard-> min assist with tandem gait and min guard with side stepping and retro gait.  Noted quick to fatigue of (B) quads during  sit -> stand, as pt requiring increased VC as reps increased to stand with knees straight to complete exercise.     PT Frequency 2x /  week   PT Duration 3 weeks   PT Plan Continue to progress with glut strengthening as well as balance for HEP at discharge after 3 weeks.         Problem List Patient Active Problem List   Diagnosis Date Noted  . Weakness of both legs 05/09/2014  . Poor balance 05/09/2014  . Difficulty in walking(719.7) 05/09/2014  . Chest pain 03/23/2014  . CHF (congestive heart failure) 02/12/2014  . Exertional dyspnea 02/12/2014  . Chest pain of uncertain etiology 83/38/2505  . Diabetes 01/01/2014  . Chronic kidney disease (CKD) stage G3b/A2, moderately decreased glomerular filtration rate (GFR) between 30-44 mL/min/1.73 square meter and albuminuria creatinine ratio between 30-299 mg/g 12/14/2013  . NSTEMI (non-ST elevated myocardial infarction) 11/28/2013  . HCAP (healthcare-associated pneumonia) 11/27/2013  . Community acquired pneumonia 11/27/2013  . Acute on chronic systolic heart failure 39/76/7341  . Other pancytopenia 01/08/2013  . Non-ST elevation MI (NSTEMI) 01/06/2013  . High output heart failure 10/25/2012  . Myelodysplastic syndrome with 5 q minus 10/17/2012  . History of colonic polyps 02/28/2012  . Anemia 01/10/2012  . Angina effort 01/06/2012  . AVM (arteriovenous malformation) of colon without hemorrhage 01/04/2012  . GI bleeding 10/09/2011  . Long term current use of anticoagulant 12/22/2010  . CAROTID ARTERY STENOSIS 10/16/2010  . DYSLIPIDEMIA 04/11/2009  . CAD 04/11/2009  . Atrial fibrillation 04/11/2009  . PVD 04/11/2009  . OSTEOARTHRITIS 04/11/2009     Tracie Lindbloom 08/15/2014, 1:50 PM

## 2014-08-19 ENCOUNTER — Other Ambulatory Visit (HOSPITAL_COMMUNITY): Payer: Self-pay | Admitting: Oncology

## 2014-08-19 ENCOUNTER — Ambulatory Visit (HOSPITAL_COMMUNITY)
Admission: RE | Admit: 2014-08-19 | Discharge: 2014-08-19 | Disposition: A | Discharge status: Home / Self Care | Payer: Medicare Other | Source: Ambulatory Visit | Attending: Oncology | Admitting: Oncology

## 2014-08-19 ENCOUNTER — Encounter (HOSPITAL_BASED_OUTPATIENT_CLINIC_OR_DEPARTMENT_OTHER): Payer: Medicare Other

## 2014-08-19 DIAGNOSIS — R29898 Other symptoms and signs involving the musculoskeletal system: Secondary | ICD-10-CM

## 2014-08-19 DIAGNOSIS — Z5189 Encounter for other specified aftercare: Secondary | ICD-10-CM | POA: Diagnosis not present

## 2014-08-19 DIAGNOSIS — D46C Myelodysplastic syndrome with isolated del(5q) chromosomal abnormality: Secondary | ICD-10-CM | POA: Diagnosis not present

## 2014-08-19 DIAGNOSIS — R2689 Other abnormalities of gait and mobility: Secondary | ICD-10-CM

## 2014-08-19 LAB — CBC WITH DIFFERENTIAL/PLATELET
BASOS ABS: 0.1 10*3/uL (ref 0.0–0.1)
BASOS PCT: 3 % — AB (ref 0–1)
EOS PCT: 16 % — AB (ref 0–5)
Eosinophils Absolute: 0.6 10*3/uL (ref 0.0–0.7)
HEMATOCRIT: 26.3 % — AB (ref 39.0–52.0)
HEMOGLOBIN: 8.9 g/dL — AB (ref 13.0–17.0)
Lymphocytes Relative: 32 % (ref 12–46)
Lymphs Abs: 1.2 10*3/uL (ref 0.7–4.0)
MCH: 29.8 pg (ref 26.0–34.0)
MCHC: 33.8 g/dL (ref 30.0–36.0)
MCV: 88 fL (ref 78.0–100.0)
MONOS PCT: 5 % (ref 3–12)
Monocytes Absolute: 0.2 10*3/uL (ref 0.1–1.0)
Neutro Abs: 1.6 10*3/uL — ABNORMAL LOW (ref 1.7–7.7)
Neutrophils Relative %: 45 % (ref 43–77)
Platelets: 95 10*3/uL — ABNORMAL LOW (ref 150–400)
RBC: 2.99 MIL/uL — ABNORMAL LOW (ref 4.22–5.81)
RDW: 17.1 % — AB (ref 11.5–15.5)
WBC: 3.6 10*3/uL — ABNORMAL LOW (ref 4.0–10.5)

## 2014-08-19 LAB — PREPARE RBC (CROSSMATCH)

## 2014-08-19 NOTE — Progress Notes (Addendum)
Labs for cbcd, ADDED TYSCR,RBC PREPARE

## 2014-08-19 NOTE — Addendum Note (Signed)
Addended by: Jerald Kief on: 08/19/2014 01:26 PM   Modules accepted: Orders

## 2014-08-19 NOTE — Therapy (Signed)
Physical Therapy Treatment  Patient Details  Name: [name] MRN: [id number] Date of Birth: 03-Oct-1931  Encounter Date: 08/19/2014      PT End of Session - 08/19/14 1344    Visit Number 27   Number of Visits 30   Date for PT Re-Evaluation 09/05/14   Authorization Type medicare   Authorization - Visit Number 85   Authorization - Number of Visits 30   PT Start Time 1302   PT Stop Time 1343   PT Time Calculation (min) 41 min   Equipment Utilized During Treatment Gait belt   Activity Tolerance Patient tolerated treatment well   Behavior During Therapy WFL for tasks assessed/performed      Past Medical History  Diagnosis Date  . Essential hypertension, benign   . COPD (chronic obstructive pulmonary disease)   . Coronary atherosclerosis of native coronary artery     Multivessel status post CABG 1996  . Arthritis   . Atrial fibrillation     Coumadin stopped 12/2011 due to anemia  . Dysphagia   . Anemia     Requiring blood transfusions  . Carotid stenosis     Dr Scot Dock   . Diabetes mellitus, type 2   . Mixed hyperlipidemia     Statin intolerant  . Hematuria     Felt related to Foley insertion  . Cardiomyopathy     LVEF 45-50%  . Melena     EGD & Colonoscopy 09/2011 revealed gastritis, erosions, scars, diverticula, 8 colon polyps (largest 1.6cm, not removed 2/2 anticoagulation), small AVM in transverse colon  . History of tobacco abuse     Quit 1992  . Anxiety   . GERD (gastroesophageal reflux disease)   . Myocardial infarction   . Thrombocytopenia   . Myelodysplastic syndrome with 5 q minus 10/17/2012    On Revlimid 5 mg daily 21 days on and 7 days off  . Aortic stenosis     Mild  . CKD (chronic kidney disease) stage 3, GFR 30-59 ml/min   . Pneumonia 11/2013    history of  . Blood transfusion without reported diagnosis     Past Surgical History  Procedure Laterality Date  . Knee surgery  1960    Right knee cartilage  . Rotator cuff repair  1990     right   . Inguinal hernia repair  2000 and 2010    left  . Cholecystectomy  05/2010    Lap chole with biologic mesh repair/reinforcement by  Dr Rise Patience  . Back surgery  02/2006    ruptured disk,  post op diskitis infection requiring prolonged hospital stay and  surgical I & D  . Tonsillectomy    . Esophagogastroduodenoscopy  10/08/2011    Procedure: ESOPHAGOGASTRODUODENOSCOPY (EGD);  Surgeon: [name], MD;  [facility];  Service: Endoscopy;  Laterality: N/A;  . Colonoscopy  10/08/2011    Procedure: COLONOSCOPY;  Surgeon: [name], MD;  [facility];  Service: Endoscopy;  Laterality: N/A;  . Cataract extraction, bilateral    . Peg placement  07/2006    placed due to prolonged infection, poor po intake, malnutrition during several week hospital stay resulting from ruptured disc that became infected following surgery  . Colonoscopy  01/14/2012    Procedure: COLONOSCOPY;  Surgeon: [name], MD;  [facility];  Service: Endoscopy;  Laterality: N/A;  945  . Coronary artery bypass graft  1996    CABG x 4  .  Ventral hernia repair    . Colonoscopy  04/20/2012    Procedure: COLONOSCOPY;  Surgeon: [name], MD;  [facility];  Service: Endoscopy;  Laterality: N/A;  1030  . Pr vein bypass graft,aorto-fem-pop  1996  . Spine surgery    . Joint replacement  1960    Right Knee  . Lymph node biopsy  08/15/2012    Procedure: LYMPH NODE BIOPSY;  Surgeon: Scherry Ran, MD;  Location: [facility];  Service: General;  Laterality: Left;  Cervical Lymph Node Bx in Minor Room  . Bone marrow biopsy    . Bone marrow aspiration      There were no vitals taken for this visit.  Visit Diagnosis:  Weakness of both legs  Poor balance        [facility] PT Assessment - 08/19/14 1304    Assessment   Medical Diagnosis Difficutly walking   Next MD Visit Blood work every MondayLuan Pulling unscheduled    Prior Therapy 2014 for back            [facility] Adult PT Treatment/Exercise - 08/19/14 1304    Exercises   Exercises Knee/Hip;Lumbar;Balance   Lumbar Exercises: Stretches   Active Hamstring Stretch 3 reps;20 seconds   Active Hamstring Stretch Limitations 14in box, 3 Way   Passive Hamstring Stretch 3 reps;30 seconds   Passive Hamstring Stretch Limitations Gastroc Stretch, Slantboard   Lumbar Exercises: Seated   Other Seated Lumbar Exercises sit to stand 3x6   Knee/Hip Exercises: Standing   Heel Raises 10 reps;Limitations   Heel Raises Limitations combo with functional squt with yellow ball on 14in step   Gait Training with cane x 360', VC for foot clearance secondary to fatigue wtih increased distance.    Other Standing Knee Exercises Standing hip extension/abduction 2x10   Balance Exercises   Tandem Walking 3 round trips              PT Long Term Goals - 08/19/14 1343    PT LONG TERM GOAL #2   Title Pt to be comfortable walking inside with a cane   Baseline 08/19/14 : Pt reports use of cane 90% of the time in the home.    Status On-going   PT LONG TERM GOAL #4   Title forward bent posture to be decreased to only 10 degree forward bend     Status On-going          Plan - 08/19/14 1345    Clinical Impression Statement Progressed gait training with a cane today, with pt able to amb 380 feet prior to seated rest break secondary to fatigue.  VC for increased foot clearance for last ~70 feet secondary to fatigue.  pt reports he uses a cane in the home for gait for at least 90% of the day, progressing for LTG.    PT Plan Continue to progress with glut strengthening as well as balance for HEP at discharge at end of POC.         Problem List Patient Active Problem List   Diagnosis Date Noted  . Weakness of both legs 05/09/2014  . Poor balance 05/09/2014  . Difficulty in walking(719.7) 05/09/2014  . Chest pain 03/23/2014  . CHF (congestive heart failure) 02/12/2014  . Exertional dyspnea 02/12/2014  . Chest pain  of uncertain etiology 69/67/8938  . Diabetes 01/01/2014  . Chronic kidney disease (CKD) stage G3b/A2, moderately decreased glomerular filtration rate (GFR) between 30-44 mL/min/1.73 square meter and albuminuria creatinine  ratio between 30-299 mg/g 12/14/2013  . NSTEMI (non-ST elevated myocardial infarction) 11/28/2013  . HCAP (healthcare-associated pneumonia) 11/27/2013  . Community acquired pneumonia 11/27/2013  . Acute on chronic systolic heart failure 59/47/0761  . Other pancytopenia 01/08/2013  . Non-ST elevation MI (NSTEMI) 01/06/2013  . High output heart failure 10/25/2012  . Myelodysplastic syndrome with 5 q minus 10/17/2012  . History of colonic polyps 02/28/2012  . Anemia 01/10/2012  . Angina effort 01/06/2012  . AVM (arteriovenous malformation) of colon without hemorrhage 01/04/2012  . GI bleeding 10/09/2011  . Long term current use of anticoagulant 12/22/2010  . CAROTID ARTERY STENOSIS 10/16/2010  . DYSLIPIDEMIA 04/11/2009  . CAD 04/11/2009  . Atrial fibrillation 04/11/2009  . PVD 04/11/2009  . OSTEOARTHRITIS 04/11/2009    Lonna Cobb, DPT [phone]

## 2014-08-20 ENCOUNTER — Encounter (HOSPITAL_BASED_OUTPATIENT_CLINIC_OR_DEPARTMENT_OTHER): Payer: Medicare Other

## 2014-08-20 ENCOUNTER — Encounter: Payer: Self-pay | Admitting: *Deleted

## 2014-08-20 VITALS — BP 167/52 | HR 52 | Temp 97.2°F | Resp 18

## 2014-08-20 DIAGNOSIS — Z9289 Personal history of other medical treatment: Secondary | ICD-10-CM

## 2014-08-20 DIAGNOSIS — D46C Myelodysplastic syndrome with isolated del(5q) chromosomal abnormality: Secondary | ICD-10-CM

## 2014-08-20 MED ORDER — DIPHENHYDRAMINE HCL 25 MG PO CAPS
25.0000 mg | ORAL_CAPSULE | Freq: Once | ORAL | Status: AC
Start: 2014-08-20 — End: 2014-08-20
  Administered 2014-08-20: 25 mg via ORAL
  Filled 2014-08-20: qty 1

## 2014-08-20 MED ORDER — FUROSEMIDE 10 MG/ML IJ SOLN
20.0000 mg | Freq: Once | INTRAMUSCULAR | Status: AC
  Administered 2014-08-20: 20 mg via INTRAMUSCULAR
  Filled 2014-08-20: qty 2

## 2014-08-20 MED ORDER — SODIUM CHLORIDE 0.9 % IJ SOLN: 10.0000 mL | INTRAMUSCULAR | Status: DC | PRN

## 2014-08-20 MED ORDER — ACETAMINOPHEN 325 MG PO TABS
650.0000 mg | ORAL_TABLET | Freq: Once | ORAL | Status: AC
  Administered 2014-08-20: 650 mg via ORAL
  Filled 2014-08-20: qty 2

## 2014-08-20 NOTE — Progress Notes (Signed)
Tolerated well

## 2014-08-20 NOTE — Patient Instructions (Signed)
[facility] Discharge Instructions  RECOMMENDATIONS MADE BY THE CONSULTANT AND ANY TEST RESULTS WILL BE SENT TO YOUR REFERRING PHYSICIAN.  Today you were given 2 units of blood. Please follow up as scheduled. Please call for any questions or concerns.   Thank you for choosing [facility] to provide your oncology and hematology care.  To afford each patient quality time with our providers, please arrive at least 15 minutes before your scheduled appointment time.  With your help, our goal is to use those 15 minutes to complete the necessary work-up to ensure our physicians have the information they need to help with your evaluation and healthcare recommendations.    Effective January 1st, 2014, we ask that you re-schedule your appointment with our physicians should you arrive 10 or more minutes late for your appointment.  We strive to give you quality time with our providers, and arriving late affects you and other patients whose appointments are after yours.    Again, thank you for choosing [facility].  Our hope is that these requests will decrease the amount of time that you wait before being seen by our physicians.       _____________________________________________________________  Should you have questions after your visit to [facility], please contact our office at [phone] between the hours of 8:30 a.m. and 4:30 p.m.  Voicemails left after 4:30 p.m. will not be returned until the following business day.  For prescription refill requests, have your pharmacy contact our office with your prescription refill request.    _______________________________________________________________  We hope that we have given you very good care.  You may receive a patient satisfaction survey in the mail, please complete it and return it as soon as possible.  We value your  feedback!  _______________________________________________________________  Have you asked about our STAR program?  STAR stands for Survivorship Training and Rehabilitation, and this is a nationally recognized cancer care program that focuses on survivorship and rehabilitation.  Cancer and cancer treatments may cause problems, such as, pain, making you feel tired and keeping you from doing the things that you need or want to do. Cancer rehabilitation can help. Our goal is to reduce these troubling effects and help you have the best quality of life possible.  You may receive a survey from a nurse that asks questions about your current state of health.  Based on the survey results, all eligible patients will be referred to the [facility] program for an evaluation so we can better serve you!  A frequently asked questions sheet is available upon request.

## 2014-08-20 NOTE — Progress Notes (Signed)
[place] Clinical Social Work  Clinical Social Work was referred by [place] rounding for reassessment of psychosocial needs due to wife's recent admission to the hospital.  Clinical Social Worker met with patient at [facility] to offer support and assess for needs. Pt reports he is concerned about his wife as she has been in the hospital for three days. 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 Pt reports he can help take care of wife when she is discharged. CSW inquired if he might need additional help as his wife may be very weak after hospital admission. Pt reports he will think about it, but feels he can help at home. CSW encouraged pt to talk with his wife's care team and determine if she needs additional help/support. He will consider. CSW provided supportive listening and emotional support.   Clinical Social Work interventions: Emotional support Reassessment of needs  Loren Racer, [place] Tuesdays 8:30-1pm Wednesdays 8:30-12pm  [phone]

## 2014-08-21 ENCOUNTER — Ambulatory Visit (HOSPITAL_COMMUNITY)
Admission: RE | Admit: 2014-08-21 | Discharge: 2014-08-21 | Disposition: A | Discharge status: Home / Self Care | Payer: Medicare Other | Source: Ambulatory Visit | Attending: Oncology | Admitting: Oncology

## 2014-08-21 DIAGNOSIS — R29898 Other symptoms and signs involving the musculoskeletal system: Secondary | ICD-10-CM

## 2014-08-21 DIAGNOSIS — R2689 Other abnormalities of gait and mobility: Secondary | ICD-10-CM

## 2014-08-21 DIAGNOSIS — Z5189 Encounter for other specified aftercare: Secondary | ICD-10-CM | POA: Diagnosis not present

## 2014-08-21 LAB — TYPE AND SCREEN
ABO/RH(D): A NEG
Antibody Screen: NEGATIVE
UNIT DIVISION: 0
Unit division: 0

## 2014-08-21 NOTE — Therapy (Signed)
Physical Therapy Treatment  Patient Details  Name: [name] MRN: [id number] Date of Birth: [birth year]  Encounter Date: 08/21/2014      PT End of Session - 08/21/14 1329    Visit Number 28   Number of Visits 30   Date for PT Re-Evaluation 09/05/14   Authorization Type medicare   Authorization - Visit Number 28   Authorization - Number of Visits 30   PT Start Time 1300   PT Stop Time 1348   PT Time Calculation (min) 48 min   Equipment Utilized During Treatment Gait belt   Activity Tolerance Patient tolerated treatment well   Behavior During Therapy WFL for tasks assessed/performed      Past Medical History  Diagnosis Date  . Essential hypertension, benign   . COPD (chronic obstructive pulmonary disease)   . Coronary atherosclerosis of native coronary artery     Multivessel status post CABG 1996  . Arthritis   . Atrial fibrillation     Coumadin stopped 12/2011 due to anemia  . Dysphagia   . Anemia     Requiring blood transfusions  . Carotid stenosis     Dr Scot Dock   . Diabetes mellitus, type 2   . Mixed hyperlipidemia     Statin intolerant  . Hematuria     Felt related to Foley insertion  . Cardiomyopathy     LVEF 45-50%  . Melena     EGD & Colonoscopy 09/2011 revealed gastritis, erosions, scars, diverticula, 8 colon polyps (largest 1.6cm, not removed 2/2 anticoagulation), small AVM in transverse colon  . History of tobacco abuse     Quit 1992  . Anxiety   . GERD (gastroesophageal reflux disease)   . Myocardial infarction   . Thrombocytopenia   . Myelodysplastic syndrome with 5 q minus 10/17/2012    On Revlimid 5 mg daily 21 days on and 7 days off  . Aortic stenosis     Mild  . CKD (chronic kidney disease) stage 3, GFR 30-59 ml/min   . Pneumonia 11/2013    history of  . Blood transfusion without reported diagnosis     Past Surgical History  Procedure Laterality Date  . Knee surgery  1960    Right knee cartilage  . Rotator cuff repair  1990   right   . Inguinal hernia repair  2000 and 2010    left  . Cholecystectomy  05/2010    Lap chole with biologic mesh repair/reinforcement by  Dr Rise Patience  . Back surgery  02/2006    ruptured disk,  post op diskitis infection requiring prolonged hospital stay and  surgical I & D  . Tonsillectomy    . Esophagogastroduodenoscopy  10/08/2011    Procedure: ESOPHAGOGASTRODUODENOSCOPY (EGD);  Surgeon: [name], MD;  [facility];  Service: Endoscopy;  Laterality: N/A;  . Colonoscopy  10/08/2011    Procedure: COLONOSCOPY;  Surgeon: [name], MD;  [facility];  Service: Endoscopy;  Laterality: N/A;  . Cataract extraction, bilateral    . Peg placement  07/2006    placed due to prolonged infection, poor po intake, malnutrition during several week hospital stay resulting from ruptured disc that became infected following surgery  . Colonoscopy  01/14/2012    Procedure: COLONOSCOPY;  Surgeon: [name], MD;  [facility];  Service: Endoscopy;  Laterality: N/A;  945  . Coronary artery bypass graft  1996    CABG x 4  . Ventral hernia  repair    . Colonoscopy  04/20/2012    Procedure: COLONOSCOPY;  Surgeon: [name], MD;  [facility];  Service: Endoscopy;  Laterality: N/A;  1030  . Pr vein bypass graft,aorto-fem-pop  1996  . Spine surgery    . Joint replacement  1960    Right Knee  . Lymph node biopsy  08/15/2012    Procedure: LYMPH NODE BIOPSY;  Surgeon: Scherry Ran, MD;  Location: [facility];  Service: General;  Laterality: Left;  Cervical Lymph Node Bx in Minor Room  . Bone marrow biopsy    . Bone marrow aspiration      There were no vitals taken for this visit.  Visit Diagnosis:  Weakness of both legs  Poor balance      Subjective Assessment - 08/21/14 1308    Symptoms Lt hip pain scale 4/10.  Pt stated he continues to walk with SPC inside house majority of time.     Currently in Pain? Yes   Pain Score 4    Pain  Location Hip   Pain Orientation Left            [facility] Adult PT Treatment/Exercise - 08/21/14 1325    Exercises   Exercises Knee/Hip;Lumbar;Balance   Lumbar Exercises: Stretches   Active Hamstring Stretch 3 reps;20 seconds   Active Hamstring Stretch Limitations 14in box, 3 Way   Passive Hamstring Stretch 3 reps;30 seconds   Passive Hamstring Stretch Limitations Gastroc Stretch, Slantboard   Hip Flexor Stretch 3 reps;30 seconds;Limitations   Hip Flexor Stretch Limitations 14in step   Knee/Hip Exercises: Standing   Heel Raises 10 reps;Limitations   Heel Raises Limitations combo with functional squt with yellow ball on 14in step   Side Lunges Both;15 reps   Side Lunges Limitations 4in step   SLS Lt 22", Rt 14" max of 3   Other Standing Knee Exercises sumo walking x 2 RT   Other Standing Knee Exercises Standing hip extension/abduction 15x 3#   Knee/Hip Exercises: Seated   Other Seated Knee Exercises 10 STS no HHA 10x   Balance Exercises   Tandem Walking 3 round trips   Retro Gait Limitations 3RT            PT Short Term Goals - 08/21/14 1401    PT SHORT TERM GOAL #1   Title Pt to be able to stand for 10 minutes without increased pain to be able to make a sandwich for a meal   PT SHORT TERM GOAL #2   Title Pt to be able to walk for 15 minutes for better health habits   PT SHORT TERM GOAL #3   Title Pt to walk with 20 degree forward flexion            PT Long Term Goals - 08/21/14 1401    PT LONG TERM GOAL #1   Title Pt to be able to stand for 20 minutes to socialize   Status Achieved   PT LONG TERM GOAL #2   Title Pt to be comfortable walking inside with a cane   Status On-going   PT LONG TERM GOAL #3   Title Patient to be able to walk with his walker for 30 minutes at a time   Status Achieved   PT LONG TERM GOAL #4   Title forward bent posture to be decreased to only 10 degree forward bend     Status On-going  Plan - 08/21/14 1354    Clinical  Impression Statement Progressed strengthening exercises for gluteal strengthening to improve balance, pt progressing well wtih improved SLS Bil LE today.  Pt continues to require cueing to improve posture with gait and balance.   PT Plan Continue to progress with glut strengthening as well as balance for HEP at discharge at end of POC. Reassess in 2 more sessions.          Problem List Patient Active Problem List   Diagnosis Date Noted  . Weakness of both legs 05/09/2014  . Poor balance 05/09/2014  . Difficulty in walking(719.7) 05/09/2014  . Chest pain 03/23/2014  . CHF (congestive heart failure) 02/12/2014  . Exertional dyspnea 02/12/2014  . Chest pain of uncertain etiology 62/95/2841  . Diabetes 01/01/2014  . Chronic kidney disease (CKD) stage G3b/A2, moderately decreased glomerular filtration rate (GFR) between 30-44 mL/min/1.73 square meter and albuminuria creatinine ratio between 30-299 mg/g 12/14/2013  . NSTEMI (non-ST elevated myocardial infarction) 11/28/2013  . HCAP (healthcare-associated pneumonia) 11/27/2013  . Community acquired pneumonia 11/27/2013  . Acute on chronic systolic heart failure 32/44/0102  . Other pancytopenia 01/08/2013  . Non-ST elevation MI (NSTEMI) 01/06/2013  . High output heart failure 10/25/2012  . Myelodysplastic syndrome with 5 q minus 10/17/2012  . History of colonic polyps 02/28/2012  . Anemia 01/10/2012  . Angina effort 01/06/2012  . AVM (arteriovenous malformation) of colon without hemorrhage 01/04/2012  . GI bleeding 10/09/2011  . Long term current use of anticoagulant 12/22/2010  . CAROTID ARTERY STENOSIS 10/16/2010  . DYSLIPIDEMIA 04/11/2009  . CAD 04/11/2009  . Atrial fibrillation 04/11/2009  . PVD 04/11/2009  . OSTEOARTHRITIS 04/11/2009  Ihor Austin, [place] Aldona Lento 08/21/2014, 2:04 PM

## 2014-08-26 ENCOUNTER — Encounter (HOSPITAL_BASED_OUTPATIENT_CLINIC_OR_DEPARTMENT_OTHER): Payer: Medicare Other

## 2014-08-26 ENCOUNTER — Telehealth (HOSPITAL_COMMUNITY): Payer: Self-pay | Admitting: Physical Therapy

## 2014-08-26 ENCOUNTER — Ambulatory Visit (HOSPITAL_COMMUNITY): Payer: Medicare Other | Admitting: Physical Therapy

## 2014-08-26 DIAGNOSIS — D46C Myelodysplastic syndrome with isolated del(5q) chromosomal abnormality: Secondary | ICD-10-CM

## 2014-08-26 DIAGNOSIS — Z5189 Encounter for other specified aftercare: Secondary | ICD-10-CM | POA: Diagnosis not present

## 2014-08-26 LAB — CBC WITH DIFFERENTIAL/PLATELET
Basophils Absolute: 0.1 10*3/uL (ref 0.0–0.1)
Basophils Relative: 2 % — ABNORMAL HIGH (ref 0–1)
Eosinophils Absolute: 0.4 10*3/uL (ref 0.0–0.7)
Eosinophils Relative: 9 % — ABNORMAL HIGH (ref 0–5)
HCT: 31.6 % — ABNORMAL LOW (ref 39.0–52.0)
Hemoglobin: 10.6 g/dL — ABNORMAL LOW (ref 13.0–17.0)
LYMPHS ABS: 1.4 10*3/uL (ref 0.7–4.0)
LYMPHS PCT: 35 % (ref 12–46)
MCH: 29.9 pg (ref 26.0–34.0)
MCHC: 33.5 g/dL (ref 30.0–36.0)
MCV: 89 fL (ref 78.0–100.0)
Monocytes Absolute: 0.3 10*3/uL (ref 0.1–1.0)
Monocytes Relative: 7 % (ref 3–12)
NEUTROS ABS: 1.9 10*3/uL (ref 1.7–7.7)
NEUTROS PCT: 48 % (ref 43–77)
PLATELETS: 121 10*3/uL — AB (ref 150–400)
RBC: 3.55 MIL/uL — AB (ref 4.22–5.81)
RDW: 16 % — ABNORMAL HIGH (ref 11.5–15.5)
WBC: 3.9 10*3/uL — AB (ref 4.0–10.5)

## 2014-08-26 NOTE — Telephone Encounter (Signed)
His wife died and he wants to be discharged, will return if he needes  to at a later date.

## 2014-08-26 NOTE — Progress Notes (Signed)
Lab draw

## 2014-09-02 ENCOUNTER — Encounter (HOSPITAL_COMMUNITY): Payer: Medicare Other | Attending: Oncology

## 2014-09-02 DIAGNOSIS — D46C Myelodysplastic syndrome with isolated del(5q) chromosomal abnormality: Secondary | ICD-10-CM | POA: Insufficient documentation

## 2014-09-02 LAB — CBC WITH DIFFERENTIAL/PLATELET
Basophils Absolute: 0 10*3/uL (ref 0.0–0.1)
Basophils Relative: 1 % (ref 0–1)
EOS ABS: 0.3 10*3/uL (ref 0.0–0.7)
EOS PCT: 8 % — AB (ref 0–5)
HEMATOCRIT: 26.2 % — AB (ref 39.0–52.0)
HEMOGLOBIN: 9.3 g/dL — AB (ref 13.0–17.0)
LYMPHS ABS: 1 10*3/uL (ref 0.7–4.0)
LYMPHS PCT: 29 % (ref 12–46)
MCH: 30.9 pg (ref 26.0–34.0)
MCHC: 35.5 g/dL (ref 30.0–36.0)
MCV: 87 fL (ref 78.0–100.0)
MONO ABS: 0.2 10*3/uL (ref 0.1–1.0)
MONOS PCT: 5 % (ref 3–12)
Neutro Abs: 1.9 10*3/uL (ref 1.7–7.7)
Neutrophils Relative %: 57 % (ref 43–77)
Platelets: 104 10*3/uL — ABNORMAL LOW (ref 150–400)
RBC: 3.01 MIL/uL — AB (ref 4.22–5.81)
RDW: 16.5 % — ABNORMAL HIGH (ref 11.5–15.5)
WBC: 3.3 10*3/uL — AB (ref 4.0–10.5)

## 2014-09-02 LAB — COMPREHENSIVE METABOLIC PANEL
ALT: 25 U/L (ref 0–53)
ANION GAP: 13 (ref 5–15)
AST: 24 U/L (ref 0–37)
Albumin: 3.8 g/dL (ref 3.5–5.2)
Alkaline Phosphatase: 76 U/L (ref 39–117)
BUN: 17 mg/dL (ref 6–23)
CO2: 26 meq/L (ref 19–32)
Calcium: 9.8 mg/dL (ref 8.4–10.5)
Chloride: 104 mEq/L (ref 96–112)
Creatinine, Ser: 1.28 mg/dL (ref 0.50–1.35)
GFR calc Af Amer: 59 mL/min — ABNORMAL LOW (ref >90)
GFR, EST NON AFRICAN AMERICAN: 51 mL/min — AB (ref >90)
GLUCOSE: 139 mg/dL — AB (ref 70–99)
Potassium: 3.5 mEq/L — ABNORMAL LOW (ref 3.7–5.3)
SODIUM: 143 meq/L (ref 137–147)
Total Bilirubin: 0.8 mg/dL (ref 0.3–1.2)
Total Protein: 7.1 g/dL (ref 6.0–8.3)

## 2014-09-02 NOTE — Progress Notes (Signed)
Labs for cbcd,cmp

## 2014-09-09 ENCOUNTER — Encounter (HOSPITAL_BASED_OUTPATIENT_CLINIC_OR_DEPARTMENT_OTHER): Payer: Medicare Other

## 2014-09-09 DIAGNOSIS — D46C Myelodysplastic syndrome with isolated del(5q) chromosomal abnormality: Secondary | ICD-10-CM

## 2014-09-09 LAB — CBC WITH DIFFERENTIAL/PLATELET
BASOS PCT: 2 % — AB (ref 0–1)
Basophils Absolute: 0.1 10*3/uL (ref 0.0–0.1)
Eosinophils Absolute: 0.3 10*3/uL (ref 0.0–0.7)
Eosinophils Relative: 8 % — ABNORMAL HIGH (ref 0–5)
HCT: 22.1 % — ABNORMAL LOW (ref 39.0–52.0)
HEMOGLOBIN: 7.6 g/dL — AB (ref 13.0–17.0)
Lymphocytes Relative: 35 % (ref 12–46)
Lymphs Abs: 1.1 10*3/uL (ref 0.7–4.0)
MCH: 30.6 pg (ref 26.0–34.0)
MCHC: 34.4 g/dL (ref 30.0–36.0)
MCV: 89.1 fL (ref 78.0–100.0)
MONO ABS: 0.2 10*3/uL (ref 0.1–1.0)
MONOS PCT: 6 % (ref 3–12)
NEUTROS PCT: 49 % (ref 43–77)
Neutro Abs: 1.6 10*3/uL — ABNORMAL LOW (ref 1.7–7.7)
Platelets: 108 10*3/uL — ABNORMAL LOW (ref 150–400)
RBC: 2.48 MIL/uL — ABNORMAL LOW (ref 4.22–5.81)
RDW: 17.4 % — ABNORMAL HIGH (ref 11.5–15.5)
WBC: 3.3 10*3/uL — ABNORMAL LOW (ref 4.0–10.5)

## 2014-09-09 LAB — PREPARE RBC (CROSSMATCH)

## 2014-09-09 NOTE — Progress Notes (Signed)
LABS FOR CBCD 

## 2014-09-09 NOTE — Addendum Note (Signed)
Addended by: Berneta Levins on: 09/09/2014 09:18 AM   Modules accepted: Orders, SmartSet

## 2014-09-10 ENCOUNTER — Encounter (HOSPITAL_COMMUNITY): Payer: Self-pay | Admitting: Emergency Medicine

## 2014-09-10 ENCOUNTER — Encounter: Payer: Self-pay | Admitting: *Deleted

## 2014-09-10 ENCOUNTER — Emergency Department (HOSPITAL_COMMUNITY): Payer: Medicare Other

## 2014-09-10 ENCOUNTER — Other Ambulatory Visit: Payer: Self-pay

## 2014-09-10 ENCOUNTER — Encounter (HOSPITAL_BASED_OUTPATIENT_CLINIC_OR_DEPARTMENT_OTHER): Payer: Medicare Other

## 2014-09-10 ENCOUNTER — Inpatient Hospital Stay (HOSPITAL_COMMUNITY): Payer: Medicare Other

## 2014-09-10 ENCOUNTER — Inpatient Hospital Stay (HOSPITAL_COMMUNITY)
Admission: EM | Admit: 2014-09-10 | Discharge: 2014-09-12 | DRG: 292 | Disposition: A | Discharge status: Home / Self Care | Payer: Medicare Other | Attending: Pulmonary Disease | Admitting: Pulmonary Disease

## 2014-09-10 VITALS — BP 197/80 | HR 95 | Temp 97.5°F | Resp 26 | Wt 190.0 lb

## 2014-09-10 DIAGNOSIS — T8092XA Unspecified transfusion reaction, initial encounter: Secondary | ICD-10-CM

## 2014-09-10 DIAGNOSIS — I5043 Acute on chronic combined systolic (congestive) and diastolic (congestive) heart failure: Secondary | ICD-10-CM | POA: Diagnosis present

## 2014-09-10 DIAGNOSIS — Z79891 Long term (current) use of opiate analgesic: Secondary | ICD-10-CM

## 2014-09-10 DIAGNOSIS — I25119 Atherosclerotic heart disease of native coronary artery with unspecified angina pectoris: Secondary | ICD-10-CM | POA: Diagnosis present

## 2014-09-10 DIAGNOSIS — D469 Myelodysplastic syndrome, unspecified: Secondary | ICD-10-CM | POA: Diagnosis present

## 2014-09-10 DIAGNOSIS — I350 Nonrheumatic aortic (valve) stenosis: Secondary | ICD-10-CM | POA: Diagnosis present

## 2014-09-10 DIAGNOSIS — E782 Mixed hyperlipidemia: Secondary | ICD-10-CM | POA: Diagnosis present

## 2014-09-10 DIAGNOSIS — Z79899 Other long term (current) drug therapy: Secondary | ICD-10-CM

## 2014-09-10 DIAGNOSIS — I482 Chronic atrial fibrillation: Secondary | ICD-10-CM | POA: Diagnosis present

## 2014-09-10 DIAGNOSIS — I5023 Acute on chronic systolic (congestive) heart failure: Secondary | ICD-10-CM

## 2014-09-10 DIAGNOSIS — I739 Peripheral vascular disease, unspecified: Secondary | ICD-10-CM | POA: Diagnosis present

## 2014-09-10 DIAGNOSIS — Z8249 Family history of ischemic heart disease and other diseases of the circulatory system: Secondary | ICD-10-CM | POA: Diagnosis not present

## 2014-09-10 DIAGNOSIS — N183 Chronic kidney disease, stage 3 (moderate): Secondary | ICD-10-CM | POA: Diagnosis present

## 2014-09-10 DIAGNOSIS — I252 Old myocardial infarction: Secondary | ICD-10-CM | POA: Diagnosis not present

## 2014-09-10 DIAGNOSIS — R0602 Shortness of breath: Secondary | ICD-10-CM

## 2014-09-10 DIAGNOSIS — I2510 Atherosclerotic heart disease of native coronary artery without angina pectoris: Secondary | ICD-10-CM | POA: Diagnosis present

## 2014-09-10 DIAGNOSIS — M1990 Unspecified osteoarthritis, unspecified site: Secondary | ICD-10-CM | POA: Diagnosis present

## 2014-09-10 DIAGNOSIS — E119 Type 2 diabetes mellitus without complications: Secondary | ICD-10-CM | POA: Diagnosis present

## 2014-09-10 DIAGNOSIS — Z7982 Long term (current) use of aspirin: Secondary | ICD-10-CM | POA: Diagnosis not present

## 2014-09-10 DIAGNOSIS — R0603 Acute respiratory distress: Secondary | ICD-10-CM

## 2014-09-10 DIAGNOSIS — I429 Cardiomyopathy, unspecified: Secondary | ICD-10-CM | POA: Diagnosis present

## 2014-09-10 DIAGNOSIS — J810 Acute pulmonary edema: Secondary | ICD-10-CM

## 2014-09-10 DIAGNOSIS — N1832 Chronic kidney disease, stage 3b: Secondary | ICD-10-CM | POA: Diagnosis present

## 2014-09-10 DIAGNOSIS — I6529 Occlusion and stenosis of unspecified carotid artery: Secondary | ICD-10-CM

## 2014-09-10 DIAGNOSIS — Z87891 Personal history of nicotine dependence: Secondary | ICD-10-CM

## 2014-09-10 DIAGNOSIS — Z951 Presence of aortocoronary bypass graft: Secondary | ICD-10-CM | POA: Diagnosis not present

## 2014-09-10 DIAGNOSIS — K219 Gastro-esophageal reflux disease without esophagitis: Secondary | ICD-10-CM | POA: Diagnosis present

## 2014-09-10 DIAGNOSIS — I5033 Acute on chronic diastolic (congestive) heart failure: Secondary | ICD-10-CM

## 2014-09-10 DIAGNOSIS — I25708 Atherosclerosis of coronary artery bypass graft(s), unspecified, with other forms of angina pectoris: Secondary | ICD-10-CM | POA: Insufficient documentation

## 2014-09-10 DIAGNOSIS — Z809 Family history of malignant neoplasm, unspecified: Secondary | ICD-10-CM

## 2014-09-10 DIAGNOSIS — D46C Myelodysplastic syndrome with isolated del(5q) chromosomal abnormality: Secondary | ICD-10-CM | POA: Diagnosis present

## 2014-09-10 DIAGNOSIS — I4891 Unspecified atrial fibrillation: Secondary | ICD-10-CM | POA: Diagnosis present

## 2014-09-10 DIAGNOSIS — I509 Heart failure, unspecified: Secondary | ICD-10-CM

## 2014-09-10 DIAGNOSIS — I129 Hypertensive chronic kidney disease with stage 1 through stage 4 chronic kidney disease, or unspecified chronic kidney disease: Secondary | ICD-10-CM | POA: Diagnosis present

## 2014-09-10 DIAGNOSIS — Z833 Family history of diabetes mellitus: Secondary | ICD-10-CM | POA: Diagnosis not present

## 2014-09-10 DIAGNOSIS — J449 Chronic obstructive pulmonary disease, unspecified: Secondary | ICD-10-CM | POA: Diagnosis present

## 2014-09-10 LAB — CBC WITH DIFFERENTIAL/PLATELET
Basophils Absolute: 0 10*3/uL (ref 0.0–0.1)
Basophils Relative: 1 % (ref 0–1)
Eosinophils Absolute: 0.2 10*3/uL (ref 0.0–0.7)
Eosinophils Relative: 9 % — ABNORMAL HIGH (ref 0–5)
HCT: 31.7 % — ABNORMAL LOW (ref 39.0–52.0)
Hemoglobin: 10.5 g/dL — ABNORMAL LOW (ref 13.0–17.0)
LYMPHS ABS: 0.8 10*3/uL (ref 0.7–4.0)
LYMPHS PCT: 37 % (ref 12–46)
MCH: 30.8 pg (ref 26.0–34.0)
MCHC: 33.1 g/dL (ref 30.0–36.0)
MCV: 93 fL (ref 78.0–100.0)
Monocytes Absolute: 0.1 10*3/uL (ref 0.1–1.0)
Monocytes Relative: 2 % — ABNORMAL LOW (ref 3–12)
NEUTROS PCT: 51 % (ref 43–77)
Neutro Abs: 1.1 10*3/uL — ABNORMAL LOW (ref 1.7–7.7)
PLATELETS: 100 10*3/uL — AB (ref 150–400)
RBC: 3.41 MIL/uL — AB (ref 4.22–5.81)
RDW: 16.9 % — ABNORMAL HIGH (ref 11.5–15.5)
WBC: 2.2 10*3/uL — ABNORMAL LOW (ref 4.0–10.5)

## 2014-09-10 LAB — URINALYSIS W MICROSCOPIC (NOT AT ARMC)
BILIRUBIN URINE: NEGATIVE
GLUCOSE, UA: 250 mg/dL — AB
KETONES UR: NEGATIVE mg/dL
Leukocytes, UA: NEGATIVE
Nitrite: NEGATIVE
PROTEIN: NEGATIVE mg/dL
Specific Gravity, Urine: 1.015 (ref 1.005–1.030)
UROBILINOGEN UA: 0.2 mg/dL (ref 0.0–1.0)
pH: 5 (ref 5.0–8.0)

## 2014-09-10 LAB — BASIC METABOLIC PANEL
Anion gap: 12 (ref 5–15)
BUN: 14 mg/dL (ref 6–23)
CO2: 26 meq/L (ref 19–32)
Calcium: 8.5 mg/dL (ref 8.4–10.5)
Chloride: 109 mEq/L (ref 96–112)
Creatinine, Ser: 1.15 mg/dL (ref 0.50–1.35)
GFR calc Af Amer: 67 mL/min — ABNORMAL LOW (ref >90)
GFR calc non Af Amer: 58 mL/min — ABNORMAL LOW (ref >90)
GLUCOSE: 116 mg/dL — AB (ref 70–99)
POTASSIUM: 3.5 meq/L — AB (ref 3.7–5.3)
SODIUM: 147 meq/L (ref 137–147)

## 2014-09-10 LAB — TROPONIN I
Troponin I: <0.3 ng/mL (ref <0.30)
Troponin I: <0.3 ng/mL (ref <0.30)
Troponin I: <0.3 ng/mL (ref <0.30)

## 2014-09-10 LAB — HEPARIN LEVEL (UNFRACTIONATED): Heparin Unfractionated: 0.2 IU/mL — ABNORMAL LOW (ref 0.30–0.70)

## 2014-09-10 LAB — TRANSFUSION REACTION
DAT C3: NEGATIVE
POST RXN DAT IGG: NEGATIVE

## 2014-09-10 LAB — MRSA PCR SCREENING: MRSA by PCR: NEGATIVE

## 2014-09-10 LAB — PRO B NATRIURETIC PEPTIDE: PRO B NATRI PEPTIDE: 1122 pg/mL — AB (ref 0–450)

## 2014-09-10 LAB — GLUCOSE, CAPILLARY: GLUCOSE-CAPILLARY: 256 mg/dL — AB (ref 70–99)

## 2014-09-10 MED ORDER — ALBUTEROL SULFATE (2.5 MG/3ML) 0.083% IN NEBU
2.5000 mg | INHALATION_SOLUTION | Freq: Once | RESPIRATORY_TRACT | Status: AC
  Administered 2014-09-10: 2.5 mg via RESPIRATORY_TRACT
  Filled 2014-09-10: qty 3

## 2014-09-10 MED ORDER — INSULIN ASPART 100 UNIT/ML ~~LOC~~ SOLN: 0.0000 [IU] | Freq: Three times a day (TID) | SUBCUTANEOUS | Status: DC

## 2014-09-10 MED ORDER — SODIUM CHLORIDE 0.9 % IJ SOLN: 3.0000 mL | INTRAMUSCULAR | Status: DC | PRN

## 2014-09-10 MED ORDER — SODIUM CHLORIDE 0.9 % IJ SOLN
10.0000 mL | INTRAMUSCULAR | Status: AC | PRN
  Administered 2014-09-10: 10 mL

## 2014-09-10 MED ORDER — FUROSEMIDE 10 MG/ML IJ SOLN
40.0000 mg | Freq: Two times a day (BID) | INTRAMUSCULAR | Status: DC
  Administered 2014-09-10 – 2014-09-12 (×4): 40 mg via INTRAVENOUS
  Filled 2014-09-10 (×4): qty 4

## 2014-09-10 MED ORDER — METHYLPREDNISOLONE SODIUM SUCC 125 MG IJ SOLR
INTRAMUSCULAR | Status: AC
  Filled 2014-09-10: qty 2

## 2014-09-10 MED ORDER — HYDROCODONE-ACETAMINOPHEN 5-325 MG PO TABS: 1.0000 | ORAL_TABLET | ORAL | Status: DC | PRN

## 2014-09-10 MED ORDER — IOHEXOL 350 MG/ML SOLN
100.0000 mL | Freq: Once | INTRAVENOUS | Status: AC | PRN
  Administered 2014-09-10: 100 mL via INTRAVENOUS

## 2014-09-10 MED ORDER — SODIUM CHLORIDE 0.9 % IJ SOLN
3.0000 mL | Freq: Two times a day (BID) | INTRAMUSCULAR | Status: DC
  Administered 2014-09-10 – 2014-09-12 (×3): 3 mL via INTRAVENOUS

## 2014-09-10 MED ORDER — LEVALBUTEROL HCL 0.63 MG/3ML IN NEBU: 0.6300 mg | INHALATION_SOLUTION | Freq: Four times a day (QID) | RESPIRATORY_TRACT | Status: DC | PRN

## 2014-09-10 MED ORDER — FUROSEMIDE 10 MG/ML IJ SOLN
INTRAMUSCULAR | Status: AC
Start: 2014-09-10 — End: 2014-09-10
  Filled 2014-09-10: qty 4

## 2014-09-10 MED ORDER — NITROGLYCERIN 0.4 MG SL SUBL: 0.4000 mg | SUBLINGUAL_TABLET | SUBLINGUAL | Status: DC | PRN

## 2014-09-10 MED ORDER — MORPHINE SULFATE 2 MG/ML IJ SOLN: 5.0000 mg | INTRAMUSCULAR | Status: DC

## 2014-09-10 MED ORDER — AMITRIPTYLINE HCL 10 MG PO TABS
30.0000 mg | ORAL_TABLET | Freq: Every day | ORAL | Status: DC
  Administered 2014-09-10 – 2014-09-11 (×2): 30 mg via ORAL
  Filled 2014-09-10 (×2): qty 3

## 2014-09-10 MED ORDER — SODIUM CHLORIDE 0.9 % IV SOLN: 250.0000 mL | INTRAVENOUS | Status: DC | PRN

## 2014-09-10 MED ORDER — DIPHENHYDRAMINE HCL 50 MG/ML IJ SOLN
INTRAMUSCULAR | Status: AC
Start: 2014-09-10 — End: 2014-09-10
  Filled 2014-09-10: qty 1

## 2014-09-10 MED ORDER — SODIUM CHLORIDE 0.9 % IJ SOLN
3.0000 mL | Freq: Two times a day (BID) | INTRAMUSCULAR | Status: DC
  Administered 2014-09-10 – 2014-09-12 (×4): 3 mL via INTRAVENOUS

## 2014-09-10 MED ORDER — HEPARIN (PORCINE) IN NACL 100-0.45 UNIT/ML-% IJ SOLN
1200.0000 [IU]/h | INTRAMUSCULAR | Status: DC
  Administered 2014-09-10: 1050 [IU]/h via INTRAVENOUS

## 2014-09-10 MED ORDER — FUROSEMIDE 10 MG/ML IJ SOLN
40.0000 mg | Freq: Once | INTRAMUSCULAR | Status: AC
  Administered 2014-09-10: 40 mg via INTRAVENOUS

## 2014-09-10 MED ORDER — ONDANSETRON HCL 4 MG/2ML IJ SOLN: 4.0000 mg | Freq: Three times a day (TID) | INTRAMUSCULAR | Status: AC | PRN

## 2014-09-10 MED ORDER — ACETAMINOPHEN 650 MG RE SUPP: 650.0000 mg | Freq: Four times a day (QID) | RECTAL | Status: DC | PRN

## 2014-09-10 MED ORDER — METOPROLOL TARTRATE 25 MG PO TABS
25.0000 mg | ORAL_TABLET | Freq: Two times a day (BID) | ORAL | Status: DC
  Administered 2014-09-10 – 2014-09-12 (×4): 25 mg via ORAL
  Filled 2014-09-10 (×4): qty 1

## 2014-09-10 MED ORDER — METHYLPREDNISOLONE SODIUM SUCC 125 MG IJ SOLR
125.0000 mg | Freq: Once | INTRAMUSCULAR | Status: AC
  Administered 2014-09-10: 125 mg via INTRAVENOUS

## 2014-09-10 MED ORDER — MORPHINE SULFATE 2 MG/ML IJ SOLN: 2.0000 mg | INTRAMUSCULAR | Status: DC | PRN

## 2014-09-10 MED ORDER — FUROSEMIDE 10 MG/ML IJ SOLN
40.0000 mg | INTRAMUSCULAR | Status: DC
Start: 2014-09-10 — End: 2014-09-10

## 2014-09-10 MED ORDER — DIPHENHYDRAMINE HCL 50 MG/ML IJ SOLN
50.0000 mg | Freq: Once | INTRAMUSCULAR | Status: AC
  Administered 2014-09-10: 50 mg via INTRAVENOUS

## 2014-09-10 MED ORDER — AMIODARONE HCL 200 MG PO TABS
100.0000 mg | ORAL_TABLET | Freq: Every day | ORAL | Status: DC
  Administered 2014-09-10 – 2014-09-12 (×4): 100 mg via ORAL
  Filled 2014-09-10 (×3): qty 1

## 2014-09-10 MED ORDER — AMLODIPINE BESYLATE 5 MG PO TABS
5.0000 mg | ORAL_TABLET | Freq: Every day | ORAL | Status: DC
  Administered 2014-09-10 – 2014-09-11 (×2): 5 mg via ORAL
  Filled 2014-09-10: qty 1

## 2014-09-10 MED ORDER — HEPARIN BOLUS VIA INFUSION
4000.0000 [IU] | Freq: Once | INTRAVENOUS | Status: AC
  Administered 2014-09-10: 4000 [IU] via INTRAVENOUS

## 2014-09-10 MED ORDER — HEPARIN (PORCINE) IN NACL 100-0.45 UNIT/ML-% IJ SOLN
12.0000 [IU]/kg/h | INTRAMUSCULAR | Status: DC
  Administered 2014-09-10: 12 [IU]/kg/h via INTRAVENOUS
  Filled 2014-09-10: qty 250

## 2014-09-10 MED ORDER — POTASSIUM CHLORIDE CRYS ER 20 MEQ PO TBCR
40.0000 meq | EXTENDED_RELEASE_TABLET | Freq: Once | ORAL | Status: AC
  Administered 2014-09-10: 40 meq via ORAL
  Filled 2014-09-10: qty 2

## 2014-09-10 MED ORDER — SODIUM CHLORIDE 0.9 % IV SOLN
250.0000 mL | Freq: Once | INTRAVENOUS | Status: AC
  Administered 2014-09-10: 250 mL via INTRAVENOUS

## 2014-09-10 MED ORDER — ACETAMINOPHEN 325 MG PO TABS: 650.0000 mg | ORAL_TABLET | Freq: Four times a day (QID) | ORAL | Status: DC | PRN

## 2014-09-10 MED ORDER — ALUM & MAG HYDROXIDE-SIMETH 200-200-20 MG/5ML PO SUSP: 30.0000 mL | Freq: Four times a day (QID) | ORAL | Status: DC | PRN

## 2014-09-10 MED ORDER — ASPIRIN EC 325 MG PO TBEC
325.0000 mg | DELAYED_RELEASE_TABLET | Freq: Every day | ORAL | Status: DC
  Administered 2014-09-10 – 2014-09-12 (×3): 325 mg via ORAL
  Filled 2014-09-10 (×3): qty 1

## 2014-09-10 MED ORDER — ISOSORBIDE MONONITRATE ER 60 MG PO TB24
30.0000 mg | ORAL_TABLET | Freq: Every day | ORAL | Status: DC
  Administered 2014-09-10 – 2014-09-12 (×3): 30 mg via ORAL
  Filled 2014-09-10 (×3): qty 1

## 2014-09-10 MED ORDER — INSULIN ASPART 100 UNIT/ML ~~LOC~~ SOLN
0.0000 [IU] | Freq: Three times a day (TID) | SUBCUTANEOUS | Status: DC
  Administered 2014-09-10: 8 [IU] via SUBCUTANEOUS
  Administered 2014-09-11 (×3): 3 [IU] via SUBCUTANEOUS
  Administered 2014-09-12: 5 [IU] via SUBCUTANEOUS

## 2014-09-10 NOTE — Progress Notes (Signed)
[place] calls to undersigned that patient is shaking and having chills and she is going to get him a blanket. Upon presenting to room, undersigned noted patient to be shaking uncontrollably. Immediately stopped blood transfusion and d/c IV line. NS at gravity flow. Reported to Dr.Formanek above. Orders for benadryl 50 mg IV and solumedrol 125 mg IV STAT given as ordered. During same time patient became more SOB and complained of chest pain. Called to nurse to call rapid response. MD summoned to room. Patient placed on O2 2L/[place] 4L. IV Lasix 40 mg IV STAT as ordered. Changed patient over to O2 at 10L/min via mask. Patient reports then that he was having chest pain apx 20 mins prior but did not report to nurse, instead he took 0.4 mg NTG that he had in his pocket. Patient given additional NTG 0.4 mg SL per Dr.Formanek. Instruct to transport patient to ER STAT which was done.

## 2014-09-10 NOTE — ED Provider Notes (Signed)
CSN: [id number]     Arrival date & time 09/10/14  1309 History   First MD Initiated Contact with Patient 09/10/14 1313     Chief Complaint  Patient presents with  . Chest Pain  . Shortness of Breath     (Consider location/radiation/quality/duration/timing/severity/associated sxs/prior Treatment) HPI Comments: 80 year old male, history of COPD, coronary disease status post CABG in 1996, myocardial infarction and a myelodysplastic syndrome. He has aortic stenosis, he has chronic kidney disease stage III, he gets frequent blood transfusions. During a blood transfusion today he had acute onset of chest pain shortness of breath though he does report that he was having chest pain last night which was heaviness and went away with nitroglycerin. The symptoms became severe during his transfusion and he was brought to the emergency department in respiratory distress.  Patient is a 80 y.o. male presenting with chest pain and shortness of breath. The history is provided by the patient and medical records.  Chest Pain Associated symptoms: shortness of breath   Shortness of Breath Associated symptoms: chest pain     Past Medical History  Diagnosis Date  . Essential hypertension, benign   . COPD (chronic obstructive pulmonary disease)   . Coronary atherosclerosis of native coronary artery     Multivessel status post CABG 1996  . Arthritis   . Atrial fibrillation     Coumadin stopped 12/2011 due to anemia  . Dysphagia   . Anemia     Requiring blood transfusions  . Carotid stenosis     Dr Scot Dock   . Diabetes mellitus, type 2   . Mixed hyperlipidemia     Statin intolerant  . Hematuria     Felt related to Foley insertion  . Cardiomyopathy     LVEF 45-50%  . Melena     EGD & Colonoscopy 09/2011 revealed gastritis, erosions, scars, diverticula, 8 colon polyps (largest 1.6cm, not removed 2/2 anticoagulation), small AVM in transverse colon  . History of tobacco abuse     Quit 1992  . Anxiety    . GERD (gastroesophageal reflux disease)   . Myocardial infarction   . Thrombocytopenia   . Myelodysplastic syndrome with 5 q minus 10/17/2012    On Revlimid 5 mg daily 21 days on and 7 days off  . Aortic stenosis     Mild  . CKD (chronic kidney disease) stage 3, GFR 30-59 ml/min   . Pneumonia 11/2013    history of  . Blood transfusion without reported diagnosis    Past Surgical History  Procedure Laterality Date  . Knee surgery  1960    Right knee cartilage  . Rotator cuff repair  1990    right   . Inguinal hernia repair  2000 and 2010    left  . Cholecystectomy  05/2010    Lap chole with biologic mesh repair/reinforcement by  Dr Rise Patience  . Back surgery  02/2006    ruptured disk,  post op diskitis infection requiring prolonged hospital stay and  surgical I & D  . Tonsillectomy    . Esophagogastroduodenoscopy  10/08/2011    Procedure: ESOPHAGOGASTRODUODENOSCOPY (EGD);  Surgeon: [name], MD;  Location: [facility];  Service: Endoscopy;  Laterality: N/A;  . Colonoscopy  10/08/2011    Procedure: COLONOSCOPY;  Surgeon: [name], MD;  Location: [facility];  Service: Endoscopy;  Laterality: N/A;  . Cataract extraction, bilateral    . Peg placement  07/2006    placed due to prolonged infection,  poor po intake, malnutrition during several week hospital stay resulting from ruptured disc that became infected following surgery  . Colonoscopy  01/14/2012    Procedure: COLONOSCOPY;  Surgeon: [name], MD;  Location: [facility];  Service: Endoscopy;  Laterality: N/A;  945  . Coronary artery bypass graft  1996    CABG x 4  . Ventral hernia repair    . Colonoscopy  04/20/2012    Procedure: COLONOSCOPY;  Surgeon: [name], MD;  Location: [facility];  Service: Endoscopy;  Laterality: N/A;  1030  . Pr vein bypass graft,aorto-fem-pop  1996  . Spine surgery    . Joint replacement  1960    Right Knee  . Lymph node biopsy  08/15/2012    Procedure: LYMPH NODE  BIOPSY;  Surgeon: Scherry Ran, MD;  Location: [facility];  Service: General;  Laterality: Left;  Cervical Lymph Node Bx in Minor Room  . Bone marrow biopsy    . Bone marrow aspiration     Family History  Problem Relation Age of Onset  . Heart disease Sister   . Cancer Sister   . Hyperlipidemia Sister   . Hypertension Sister   . Heart disease Brother     Heart Diseast before age 51  . Coronary artery disease Mother   . Cancer Father     Stomach  . Anesthesia problems Neg Hx   . Hypotension Neg Hx   . Malignant hyperthermia Neg Hx   . Pseudochol deficiency Neg Hx    History  Substance Use Topics  . Smoking status: Former Smoker -- 2.00 packs/day for 40 years    Types: Cigarettes    Quit date: 09/27/1990  . Smokeless tobacco: Never Used  . Alcohol Use: No    Review of Systems  Respiratory: Positive for shortness of breath.   Cardiovascular: Positive for chest pain.  All other systems reviewed and are negative.     Allergies  Statins and Tape  Home Medications   Prior to Admission medications   Medication Sig Start Date End Date Taking? Authorizing Provider  acetaminophen (TYLENOL) 500 MG tablet Take 500 mg by mouth every 6 (six) hours as needed for moderate pain.    Historical Provider, MD  albuterol (PROVENTIL HFA;VENTOLIN HFA) 108 (90 BASE) MCG/ACT inhaler Inhale 2 puffs into the lungs every 6 (six) hours as needed for wheezing. 01/08/13   [name], MD  amiodarone (PACERONE) 200 MG tablet Take 0.5 tablets (100 mg total) by mouth daily. 10/08/11   [name], MD  amitriptyline (ELAVIL) 10 MG tablet Take 30 mg by mouth at bedtime.    Historical Provider, MD  amLODipine (NORVASC) 5 MG tablet Take 5 mg by mouth daily.    Historical Provider, MD  aspirin EC 325 MG tablet Take 325 mg by mouth daily.    Historical Provider, MD  diazepam (VALIUM) 5 MG tablet Take one tablet by mouth every 6 hours as needed for muscle spasms 12/10/13   Gayland Curry, DO   doxycycline (VIBRA-TABS) 100 MG tablet  04/26/14   Historical Provider, MD  furosemide (LASIX) 40 MG tablet Take 20-40 mg by mouth daily. Take one half tablet daily on a routine basis. If your weight goes up by 3 pounds or if you have edema of your legs take one full tablet twice a day for 3 days 01/08/13   [name], MD  HYDROcodone-acetaminophen (NORCO/VICODIN) 5-325 MG per tablet Take 1 by mouth  every 6 hours as needed for pain. *MAX APAP=3GM/24 HRS- ALL SOURCES* 12/20/13   Tiffany L Reed, DO  ipratropium-albuterol (DUONEB) 0.5-2.5 (3) MG/3ML SOLN Take 3 mLs by nebulization every 6 (six) hours as needed (shortness of breath). 12/08/13   [name], MD  isosorbide mononitrate (IMDUR) 30 MG 24 hr tablet Take 1 tablet (30 mg total) by mouth daily. 12/08/13   [name], MD  metFORMIN (GLUCOPHAGE) 500 MG tablet Take 500 mg by mouth 2 (two) times daily with a meal.    Historical Provider, MD  metoprolol tartrate (LOPRESSOR) 25 MG tablet Take 25 mg by mouth 2 (two) times daily.      Historical Provider, MD  Multiple Vitamin (MULTIVITAMIN) tablet Take 1 tablet by mouth daily.      Historical Provider, MD  nitroGLYCERIN (NITROSTAT) 0.4 MG SL tablet Place 1 tablet (0.4 mg total) under the tongue every 5 (five) minutes x 3 doses as needed for chest pain (up to 3 doses). 01/08/13   [name], MD  potassium chloride SA (K-DUR,KLOR-CON) 20 MEQ tablet Take 1 tablet (20 mEq total) by mouth daily. 12/08/13   [name], MD  pregabalin (LYRICA) 75 MG capsule Take 1 capsule (75 mg total) by mouth daily. 12/18/13   Estill Dooms, MD  Red Yeast Rice 600 MG TABS Take 1 tablet by mouth daily.      Historical Provider, MD  silodosin (RAPAFLO) 8 MG CAPS capsule Take 8 mg by mouth daily with breakfast.      Historical Provider, MD   BP 101/61 mmHg  Pulse 109  Temp(Src) 97.8 F (36.6 C) (Oral)  Resp 17  Wt 190 lb (86.183 kg)  SpO2 100% Physical Exam  Constitutional: He appears  well-developed and well-nourished. He appears distressed.  HENT:  Head: Normocephalic and atraumatic.  Mouth/Throat: Oropharynx is clear and moist. No oropharyngeal exudate.  Eyes: Conjunctivae and EOM are normal. Pupils are equal, round, and reactive to light. Right eye exhibits no discharge. Left eye exhibits no discharge. No scleral icterus.  Neck: Normal range of motion. Neck supple. No JVD present. No thyromegaly present.  Cardiovascular: Regular rhythm, normal heart sounds and intact distal pulses.  Exam reveals no gallop and no friction rub.   No murmur heard. Tachy to 110 Strong peripheral pulses  Pulmonary/Chest: He is in respiratory distress. He has wheezes. He has no rales.  Accessory muscle use, tachypnea, decreased air movement in all lung fields, expiratory wheezing  Abdominal: Soft. Bowel sounds are normal. He exhibits no distension and no mass. There is no tenderness.  Musculoskeletal: Normal range of motion. He exhibits no edema or tenderness.  Lymphadenopathy:    He has no cervical adenopathy.  Neurological: He is alert. Coordination normal.  Skin: Skin is warm and dry. No rash noted. No erythema.  Psychiatric: He has a normal mood and affect. His behavior is normal.  Nursing note and vitals reviewed.    ED Course  Procedures (including critical care time) Labs Review Labs Reviewed  CBC WITH DIFFERENTIAL - Abnormal; Notable for the following:    WBC 2.2 (*)    RBC 3.41 (*)    Hemoglobin 10.5 (*)    HCT 31.7 (*)    RDW 16.9 (*)    Platelets 100 (*)    Neutro Abs 1.1 (*)    Monocytes Relative 2 (*)    Eosinophils Relative 9 (*)    All other components within normal limits  BASIC METABOLIC PANEL -  Abnormal; Notable for the following:    Potassium 3.5 (*)    Glucose, Bld 116 (*)    GFR calc non Af Amer 58 (*)    GFR calc Af Amer 67 (*)    All other components within normal limits  PRO B NATRIURETIC PEPTIDE - Abnormal; Notable for the following:    Pro B  Natriuretic peptide (BNP) 1122.0 (*)    All other components within normal limits  TROPONIN I    Imaging Review Dg Chest Port 1 View  09/10/2014   CLINICAL DATA:  Shortness of breath, wheezing, chills today with anterior chest pain; history of diabetes, CHF, and malignancy  EXAM: PORTABLE CHEST - 1 VIEW  COMPARISON:  Portable chest x-ray of March 23, 2014  FINDINGS: The lungs are adequately inflated. The interstitial markings are coarse but stable. The cardiac silhouette is normal in size. The pulmonary vascularity is prominent centrally and exhibits mild chronic cephalization. There is no pleural effusion. The patient has undergone previous median sternotomy.  IMPRESSION: There is no alveolar pneumonia. Coarse interstitial lung markings are slightly more conspicuous than on the previous study and suggests low-grade pulmonary edema. When the patient can tolerate the procedure, a PA and lateral chest x-ray would be of value.   Electronically Signed   By: David  Martinique   On: 09/10/2014 13:58    ED ECG REPORT  I personally interpreted this EKG   Date: 09/10/2014   Rate: 102  Rhythm: sinus tachycardia  QRS Axis: normal  Intervals: normal  ST/T Wave abnormalities: nonspecific T wave changes  Conduction Disutrbances:none  Narrative Interpretation:   Old EKG Reviewed: none available   MDM   Final diagnoses:  SOB (shortness of breath)  Respiratory distress  Acute pulmonary edema   the patient is in acute respiratory distress, he is requiring a nonrebreather on a cardiac monitoring, EKG shows no acute findings, there is suggestion of left ventricular hypertrophy, poor R wave progression but no signs of ST elevation or depression.  The patient has ongoing severe shortness of breath with decreased air movement, the x-ray shows that he has bilateral pulmonary edema which is likely acute given his clinical scenario. I am concerned about the patient's other risk factors especially for pulmonary  embolism or heart attack. I have discussed his care with the internal medicine physician, Dr. Luan Pulling who agrees that the patient should be evaluated for pulmonary embolism. The CT scanner at this time is not working correctly for angiogram tests, I will start heparin immediately to anticoagulate, BiPAP has been initiated for his severe dyspnea, this is showing significant improvement. The patient is critically ill, he will be admitted to a stepdown or intensive care unit  CRITICAL CARE Performed by: Johnna Acosta Total critical care time: 35 Critical care time was exclusive of separately billable procedures and treating other patients. Critical care was necessary to treat or prevent imminent or life-threatening deterioration. Critical care was time spent personally by me on the following activities: development of treatment plan with patient and/or surrogate as well as nursing, discussions with consultants, evaluation of patient's response to treatment, examination of patient, obtaining history from patient or surrogate, ordering and performing treatments and interventions, ordering and review of laboratory studies, ordering and review of radiographic studies, pulse oximetry and re-evaluation of patient's condition.  Meds given in ED:  Medications  heparin bolus via infusion 4,000 Units (not administered)  heparin ADULT infusion 100 units/mL (25000 units/250 mL) (not administered)  albuterol (PROVENTIL) (2.5 MG/3ML) 0.083%  nebulizer solution 2.5 mg (2.5 mg Nebulization Given 09/10/14 1320)       Johnna Acosta, MD 09/10/14 1440

## 2014-09-10 NOTE — Progress Notes (Signed)
[place] Clinical Social Work  Clinical Social Work was referred by nurse for assessment of psychosocial needs due to recent loss of wife.  Clinical Social Worker met with patient at [facility]  to offer support and assess for needs. Pt reports his wife died about three weeks ago due to extended illness. CSW had met with pt around that time as his wife was hospitalized. Pt reports he is adjusting to his wife's death and appears to be experiencing a typical and appropriate grief reaction. CSW discussed with pt common grief reactions and emotions. Pt has a good support system and has plans to do some traveling. CSW will continue to be available to pt for additional support and CSW will check in at next appointment. Pt aware of how to reach out to CSW as needed.   Clinical Social Work interventions: Grief education Emotional support   [name], [place] Tuesdays 8:30-1pm Wednesdays 8:30-12pm  [phone]

## 2014-09-10 NOTE — ED Notes (Signed)
CT is down at this time, will complete CT when machine is working again. ICU made aware. RT called to assist in transport to unit.

## 2014-09-10 NOTE — Progress Notes (Addendum)
ANTICOAGULATION CONSULT NOTE - Initial Consult  Pharmacy Consult for Heparin Indication: pulmonary embolus  Allergies  Allergen Reactions  . Statins Other (See Comments)    Causes legs to hurt.   . Tape     Causes skin to peel off.     Patient Measurements: Height: 5' 11.5" (181.6 cm) Weight: 188 lb 7.9 oz (85.5 kg) IBW/kg (Calculated) : 76.45  Vital Signs: Temp: 97.9 F (36.6 C) (12/15 1956) Temp Source: Oral (12/15 1956) BP: 100/40 mmHg (12/15 1900) Pulse Rate: 82 (12/15 1900)  Labs:  Recent Labs  09/09/14 0834 09/10/14 1315 09/10/14 1700 09/10/14 2200  HGB 7.6* 10.5*  --   --   HCT 22.1* 31.7*  --   --   PLT 108* 100*  --   --   HEPARINUNFRC  --   --   --  0.20*  CREATININE  --  1.15  --   --   TROPONINI  --  <0.30 <0.30 <0.30    Estimated Creatinine Clearance: 55.4 mL/min (by C-G formula based on Cr of 1.15).   Medical History: Past Medical History  Diagnosis Date  . Essential hypertension, benign   . COPD (chronic obstructive pulmonary disease)   . Coronary atherosclerosis of native coronary artery     Multivessel status post CABG 1996  . Arthritis   . Atrial fibrillation     Coumadin stopped 12/2011 due to anemia  . Dysphagia   . Anemia     Requiring blood transfusions  . Carotid stenosis     Dr Scot Dock   . Diabetes mellitus, type 2   . Mixed hyperlipidemia     Statin intolerant  . Hematuria     Felt related to Foley insertion  . Cardiomyopathy     LVEF 45-50%  . Melena     EGD & Colonoscopy 09/2011 revealed gastritis, erosions, scars, diverticula, 8 colon polyps (largest 1.6cm, not removed 2/2 anticoagulation), small AVM in transverse colon  . History of tobacco abuse     Quit 1992  . Anxiety   . GERD (gastroesophageal reflux disease)   . Myocardial infarction   . Thrombocytopenia   . Myelodysplastic syndrome with 5 q minus 10/17/2012    On Revlimid 5 mg daily 21 days on and 7 days off  . Aortic stenosis     Mild  . CKD (chronic kidney  disease) stage 3, GFR 30-59 ml/min   . Pneumonia 11/2013    history of  . Blood transfusion without reported diagnosis     Medications:  Scheduled:  . amiodarone  100 mg Oral Daily  . amitriptyline  30 mg Oral QHS  . amLODipine  5 mg Oral Daily  . aspirin EC  325 mg Oral Daily  . furosemide  40 mg Intravenous Q12H  . insulin aspart  0-15 Units Subcutaneous TID WC  . isosorbide mononitrate  30 mg Oral Daily  . metoprolol tartrate  25 mg Oral BID  . sodium chloride  3 mL Intravenous Q12H  . sodium chloride  3 mL Intravenous Q12H    Assessment:  Heparin 4000 unit bolus, infusion at 1050 units/hour started by ED MD due to concern for patient's risk factors  for pulmonary embolism or heart attack. Discussed withI have discussed his care with Dr. Luan Pulling who agreed should be evaluated for pulmonary embolism. The CT scanner at that time was not working correctly for angiogram test. Heparin infusion continued when patient admitted to ICU. CT angiography chest with contrast negative for  pulmonary embolus tonight. Heparin level presently below goal  Goal of Therapy:  Heparin level 0.3-0.7 units/ml Monitor platelets by anticoagulation protocol: Yes   Plan:  Increase heparin rate to 1200 units/hour Heparin level at 5 AM and daily CBC in AM F/U LOT    Abner Greenspan, Tenise Stetler Bennett 09/10/2014,10:42 PM

## 2014-09-10 NOTE — Progress Notes (Signed)
1310 spoke with [name] in blood bank, informed patient may have had a transfusion reaction at the completion of 2nd unit prbc. She said ER will order all appropriate lab test. Blood bag and all tubing returned to blood bank.

## 2014-09-10 NOTE — ED Notes (Signed)
Pt still has labored breathing, MD at bedside to reassess, wheezing noted bilaterally. MD removed o2 at this time to see how sats do, RT notified for bi-pap pr MD request.

## 2014-09-10 NOTE — Progress Notes (Signed)
Called by the nurses to see the patient on 12:40 PM complaining of shaking chills and anterior chest pain at the conclusion of second unit of packed red blood cells.. The patient has self medicated with 0.4 mg of nitroglycerin sublingually and subsequently the patient was given 50 mg of Benadryl and 125 mg of Solu-Medrol intravenously. An additional 0.4 mg of nitroglycerin was given to the patient bringing blood pressure down from 200/90 with heart rate of 180 down to 160/80 and a heart rate of 92. 40 mg of Lasix was also administered because of bilateral rales on pulmonary exam.. His EKG monitor appeared to indicate possible ventricular tachy dysrhythmia. Emergency room was called and the patient was promptly brought to the trauma room where ER physician, Dr. Nevada Crane, was in attendance and began questioning the patient. History was shared, patient was placed on an EKG monitor,  lab tests were done. Patient was alert and conversant. I left the patient's side at approximately 1 PM. Assessment: Probable fluid overload producing left ventricular dysfunction with ventricular tachycardia dysrhythmia. Plan: ER cardiac protocol and probable admission to CCU.

## 2014-09-10 NOTE — Addendum Note (Signed)
Addended by: Berneta Levins on: 09/10/2014 03:42 PM   Modules accepted: Orders

## 2014-09-10 NOTE — Progress Notes (Signed)
AFTER CONSULTING W/ DR HAWKINS, PT TAKEN OFF BIPAP AND PLACED ON O2 AT 3L/MIN VIA [place]. O2 SAT 97% AFTER 15MIN

## 2014-09-11 ENCOUNTER — Inpatient Hospital Stay (HOSPITAL_COMMUNITY): Payer: Medicare Other

## 2014-09-11 ENCOUNTER — Ambulatory Visit (HOSPITAL_COMMUNITY): Admission: RE | Admit: 2014-09-11 | Payer: Medicare Other | Source: Ambulatory Visit

## 2014-09-11 DIAGNOSIS — I350 Nonrheumatic aortic (valve) stenosis: Secondary | ICD-10-CM

## 2014-09-11 DIAGNOSIS — I6523 Occlusion and stenosis of bilateral carotid arteries: Secondary | ICD-10-CM

## 2014-09-11 DIAGNOSIS — I6529 Occlusion and stenosis of unspecified carotid artery: Secondary | ICD-10-CM | POA: Insufficient documentation

## 2014-09-11 DIAGNOSIS — I5033 Acute on chronic diastolic (congestive) heart failure: Secondary | ICD-10-CM

## 2014-09-11 DIAGNOSIS — I4891 Unspecified atrial fibrillation: Secondary | ICD-10-CM

## 2014-09-11 DIAGNOSIS — N183 Chronic kidney disease, stage 3 (moderate): Secondary | ICD-10-CM

## 2014-09-11 DIAGNOSIS — I739 Peripheral vascular disease, unspecified: Secondary | ICD-10-CM

## 2014-09-11 DIAGNOSIS — I25708 Atherosclerosis of coronary artery bypass graft(s), unspecified, with other forms of angina pectoris: Secondary | ICD-10-CM | POA: Insufficient documentation

## 2014-09-11 DIAGNOSIS — D46C Myelodysplastic syndrome with isolated del(5q) chromosomal abnormality: Secondary | ICD-10-CM

## 2014-09-11 LAB — HEMOGLOBIN AND HEMATOCRIT, BLOOD
HEMATOCRIT: 27.8 % — AB (ref 39.0–52.0)
Hemoglobin: 9.6 g/dL — ABNORMAL LOW (ref 13.0–17.0)

## 2014-09-11 LAB — TYPE AND SCREEN
ABO/RH(D): A NEG
ANTIBODY SCREEN: NEGATIVE
UNIT DIVISION: 0
Unit division: 0

## 2014-09-11 LAB — BASIC METABOLIC PANEL
ANION GAP: 13 (ref 5–15)
BUN: 20 mg/dL (ref 6–23)
CALCIUM: 9.4 mg/dL (ref 8.4–10.5)
CO2: 27 meq/L (ref 19–32)
Chloride: 101 mEq/L (ref 96–112)
Creatinine, Ser: 1.44 mg/dL — ABNORMAL HIGH (ref 0.50–1.35)
GFR calc non Af Amer: 44 mL/min — ABNORMAL LOW (ref >90)
GFR, EST AFRICAN AMERICAN: 51 mL/min — AB (ref >90)
Glucose, Bld: 176 mg/dL — ABNORMAL HIGH (ref 70–99)
Potassium: 4.9 mEq/L (ref 3.7–5.3)
SODIUM: 141 meq/L (ref 137–147)

## 2014-09-11 LAB — CBC
HEMATOCRIT: 26.9 % — AB (ref 39.0–52.0)
Hemoglobin: 9.3 g/dL — ABNORMAL LOW (ref 13.0–17.0)
MCH: 31.1 pg (ref 26.0–34.0)
MCHC: 34.6 g/dL (ref 30.0–36.0)
MCV: 90 fL (ref 78.0–100.0)
PLATELETS: 110 10*3/uL — AB (ref 150–400)
RBC: 2.99 MIL/uL — ABNORMAL LOW (ref 4.22–5.81)
RDW: 16.9 % — ABNORMAL HIGH (ref 11.5–15.5)
WBC: 6.6 10*3/uL (ref 4.0–10.5)

## 2014-09-11 LAB — GLUCOSE, CAPILLARY
GLUCOSE-CAPILLARY: 179 mg/dL — AB (ref 70–99)
Glucose-Capillary: 165 mg/dL — ABNORMAL HIGH (ref 70–99)
Glucose-Capillary: 155 mg/dL — ABNORMAL HIGH (ref 70–99)
Glucose-Capillary: 219 mg/dL — ABNORMAL HIGH (ref 70–99)

## 2014-09-11 LAB — HEPARIN LEVEL (UNFRACTIONATED): Heparin Unfractionated: 0.48 IU/mL (ref 0.30–0.70)

## 2014-09-11 LAB — TROPONIN I

## 2014-09-11 NOTE — Care Management Utilization Note (Signed)
UR complete 

## 2014-09-11 NOTE — H&P (Signed)
[name], [name]             ACCOUNT NO.:  [id number]  MEDICAL RECORD NO.:  [id number]  LOCATION:  IC05                          FACILITY:  [facility]  PHYSICIAN:  Briauna Gilmartin L. Luan Pulling, M.D.DATE OF BIRTH:  1932-08-29  DATE OF ADMISSION:  09/10/2014 DATE OF DISCHARGE:  LH                             HISTORY & PHYSICAL   REASON FOR ADMISSION:  Congestive heart failure, chest pain.  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  He is not a candidate for his treatment for that because of chronic kidney disease.  So, he has been getting frequent blood transfusions.  During a blood transfusion today, he had acute chest pain and shortness of breath.  He also had some chest pain last night that went away with nitroglycerin.  He had more trouble and was brought to the emergency department in respiratory distress.  He was treated in the emergency department, but it was cleared that he was not going to be able to manage at home and he is being admitted to Step-Down with congestive heart failure and chest discomfort.  PAST MEDICAL HISTORY:  Positive for: 1. Hypertension. 2. COPD. 3. Coronary artery occlusive disease. 4. Arthritis. 5. Chronic atrial fibrillation. 6. Not on anticoagulation due to anemia. 7. Dysphagia. 8. Chronic anemia related to his myelodysplastic syndrome. 9. Carotid stenosis. 10.Diabetes mellitus. 11.Hyperlipidemia. 12.Cardiomyopathy with ejection fraction of 45-50%. 13.Anxiety. 14.GERD. 15.Previous MI. 16.5q minus myelodysplastic syndrome. 17.Aortic stenosis. 18.Chronic kidney disease.  PAST SURGICAL HISTORY:  Surgically, he has had knee surgery on his right knee; right rotator cuff repair; left inguinal hernia repair; cholecystectomy; back surgery for ruptured disk and then he  developed severe problems with diskitis following that surgery and had a prolonged hospitalization; tonsillectomy; cataract extraction bilaterally; ventral hernia repair; a lymph node biopsy; bone marrow biopsy.  FAMILY HISTORY:  Positive for heart disease in his sister, cancer in his sister, hyperlipidemia in his sister, hypertension in his sister, heart disease and diabetes in a brother.  SOCIAL HISTORY:  His wife died within the last month.  He has an approximately 80-pack-year smoking history, but stopped 20 years ago. He does not use any alcohol.  He lives at home alone now.  REVIEW OF SYSTEMS:  Except as mentioned, he has not had any hemoptysis, night sweats, fever or chills.  ALLERGIES:  HE IS ALLERGIC TO STATINS AND VARIOUS TYPES OF TAPE.  MEDICATIONS:  At home, he is on 1. Tylenol 500 mg every 6 hours as needed. 2. Albuterol two puffs every 6 hours as needed for wheezing. 3. Amiodarone 200 mg one-half tablet daily. 4. Elavil 10 mg 3 at bedtime. 5. Amlodipine 5 mg daily. 6. Enteric-coated aspirin 325 mg daily. 7. Diazepam 5 mg every 6 hours, but he actually takes 2 as needed for     muscle spasm. 8. Lasix, which he takes on a p.r.n. basis. 9. Norco 5/325 q.6 hours p.r.n. pain. 10.DuoNeb as needed for shortness of breath. 11.Imdur 30 mg daily. 12.Metformin 500 mg b.i.d. 13.Metoprolol tartrate 25 mg b.i.d. 14.Multiple vitamins daily. 15.Nitroglycerin  as needed. 16.Potassium chloride 20 mEq daily. 17.Lyrica 175 mg daily. 18.Red yeast rice 600 mg daily. 19.Rapaflo 8 mg daily.  PHYSICAL EXAMINATION:  GENERAL:  When I saw him showed that he was in acute distress with his respiratory system. HEENT:  His pupils were reactive.  Nose and throat were clear.  Mucous membranes were moist. NECK:  Supple without masses. CHEST:  Shows rales bilaterally. HEART:  Irregular with a rate of about 120. EXTREMITIES:  He had no edema. ABDOMEN:  Soft. CARDIAC:  Showed that he had the  tachycardia and had a harsh systolic murmur. CENTRAL NERVOUS SYSTEM:  Showed that he was anxious, but otherwise unremarkable.  He had troponin that was negative.  He had BNP that was elevated.  Chest x-ray shows what may be some evidence of a pulmonary edema.  ASSESSMENT:  He was admitted with chest pain and what appears to be some pulmonary edema.  He has had this similar problem in the past.  Plan is for him to be admitted to Step-Down.  He is going to be heparinized for now because we cannot get a CT angiogram of his chest because of technical issues with the machine.  I do not think a ventilation perfusion scan will help [place] very much.  He is going to be treated with Lasix.  His hemoglobin is up to 10, so he does not need a transfusion.  Initial troponin is negative, this will be cycled.  He will need treatment for diabetes since he is sick.  He will have a single dose of potassium.  Follow up from there.     Brandt Chaney L. Luan Pulling, M.D.     EL[facility]/MEDQ  D:  09/10/2014  T:  09/11/2014  Job:  [id number]

## 2014-09-11 NOTE — Progress Notes (Signed)
Subjective: He feels much better. He has not had anymore chest pain. His breathing is okay.  Objective: Vital signs in last 24 hours: Temp:  [97.5 F (36.4 C)-98.6 F (37 C)] 98.1 F (36.7 C) (12/16 0000) Pulse Rate:  [63-122] 65 (12/16 0700) Resp:  [15-36] 15 (12/16 0700) BP: (89-201)/(40-90) 115/45 mmHg (12/16 0700) SpO2:  [85 %-100 %] 100 % (12/16 0700) FiO2 (%):  [40 %-100 %] 40 % (12/15 1553) Weight:  [85.5 kg (188 lb 7.9 oz)-86.183 kg (190 lb)] 85.5 kg (188 lb 7.9 oz) (12/15 1553) Weight change:  Last BM Date: 09/09/14  Intake/Output from previous day: 12/15 0701 - 12/16 0700 In: 1728 [P.O.:720; I.V.:183] Out: 1000 [Urine:1000]  PHYSICAL EXAM General appearance: alert, cooperative and no distress Resp: clear to auscultation bilaterally Cardio: irregularly irregular rhythm and Loud systolic heart murmur GI: normal findings: His abdomen is soft. There are no abnormal findings and abnormal findings:  None Extremities: extremities normal, atraumatic, no cyanosis or edema  Lab Results:  Results for orders placed or performed during the hospital encounter of 09/10/14 (from the past 48 hour(s))  Troponin I     Status: None   Collection Time: 09/10/14  1:15 PM  Result Value Ref Range   Troponin I <0.30 <0.30 ng/mL    Comment:        Due to the release kinetics of cTnI, a negative result within the first hours of the onset of symptoms does not rule out myocardial infarction with certainty. If myocardial infarction is still suspected, repeat the test at appropriate intervals.   CBC with Differential     Status: Abnormal   Collection Time: 09/10/14  1:15 PM  Result Value Ref Range   WBC 2.2 (L) 4.0 - 10.5 K/uL   RBC 3.41 (L) 4.22 - 5.81 MIL/uL   Hemoglobin 10.5 (L) 13.0 - 17.0 g/dL    Comment: DELTA CHECK NOTED   HCT 31.7 (L) 39.0 - 52.0 %   MCV 93.0 78.0 - 100.0 fL   MCH 30.8 26.0 - 34.0 pg   MCHC 33.1 30.0 - 36.0 g/dL   RDW 16.9 (H) 11.5 - 15.5 %   Platelets 100  (L) 150 - 400 K/uL    Comment: SPECIMEN CHECKED FOR CLOTS CONSISTENT WITH PREVIOUS RESULT    Neutrophils Relative % 51 43 - 77 %   Neutro Abs 1.1 (L) 1.7 - 7.7 K/uL   Lymphocytes Relative 37 12 - 46 %   Lymphs Abs 0.8 0.7 - 4.0 K/uL   Monocytes Relative 2 (L) 3 - 12 %   Monocytes Absolute 0.1 0.1 - 1.0 K/uL   Eosinophils Relative 9 (H) 0 - 5 %   Eosinophils Absolute 0.2 0.0 - 0.7 K/uL   Basophils Relative 1 0 - 1 %   Basophils Absolute 0.0 0.0 - 0.1 K/uL  Basic metabolic panel     Status: Abnormal   Collection Time: 09/10/14  1:15 PM  Result Value Ref Range   Sodium 147 137 - 147 mEq/L   Potassium 3.5 (L) 3.7 - 5.3 mEq/L   Chloride 109 96 - 112 mEq/L   CO2 26 19 - 32 mEq/L   Glucose, Bld 116 (H) 70 - 99 mg/dL   BUN 14 6 - 23 mg/dL   Creatinine, Ser 1.15 0.50 - 1.35 mg/dL   Calcium 8.5 8.4 - 10.5 mg/dL   GFR calc non Af Amer 58 (L) >90 mL/min   GFR calc Af Amer 67 (L) >90 mL/min  Comment: (NOTE) The eGFR has been calculated using the CKD EPI equation. This calculation has not been validated in all clinical situations. eGFR's persistently <90 mL/min signify possible Chronic Kidney Disease.    Anion gap 12 5 - 15  Pro b natriuretic peptide (BNP)     Status: Abnormal   Collection Time: 09/10/14  1:15 PM  Result Value Ref Range   Pro B Natriuretic peptide (BNP) 1122.0 (H) 0 - 450 pg/mL  MRSA PCR Screening     Status: None   Collection Time: 09/10/14  5:00 PM  Result Value Ref Range   MRSA by PCR NEGATIVE NEGATIVE    Comment:        The GeneXpert MRSA Assay (FDA approved for NASAL specimens only), is one component of a comprehensive MRSA colonization surveillance program. It is not intended to diagnose MRSA infection nor to guide or monitor treatment for MRSA infections.   Troponin I     Status: None   Collection Time: 09/10/14  5:00 PM  Result Value Ref Range   Troponin I <0.30 <0.30 ng/mL    Comment:        Due to the release kinetics of cTnI, a negative  result within the first hours of the onset of symptoms does not rule out myocardial infarction with certainty. If myocardial infarction is still suspected, repeat the test at appropriate intervals.   Glucose, capillary     Status: Abnormal   Collection Time: 09/10/14  5:16 PM  Result Value Ref Range   Glucose-Capillary 256 (H) 70 - 99 mg/dL  Transfusion reaction     Status: None   Collection Time: 09/10/14  6:30 PM  Result Value Ref Range   Post RXN DAT IgG NEG    DAT C3 NEG Performed at [place] interp tx rxn      NO EVIDENCE OF HEMOLYTIC REACTION BBCALL TO CATHERINE,LI MD AT 2200 ON 09/10/2014.  Urinalysis with microscopic     Status: Abnormal   Collection Time: 09/10/14  6:50 PM  Result Value Ref Range   Color, Urine YELLOW YELLOW   APPearance CLEAR CLEAR   Specific Gravity, Urine 1.015 1.005 - 1.030   pH 5.0 5.0 - 8.0   Glucose, UA 250 (A) NEGATIVE mg/dL   Hgb urine dipstick TRACE (A) NEGATIVE   Bilirubin Urine NEGATIVE NEGATIVE   Ketones, ur NEGATIVE NEGATIVE mg/dL   Protein, ur NEGATIVE NEGATIVE mg/dL   Urobilinogen, UA 0.2 0.0 - 1.0 mg/dL   Nitrite NEGATIVE NEGATIVE   Leukocytes, UA NEGATIVE NEGATIVE   WBC, UA 0-2 <3 WBC/hpf   RBC / HPF 0-2 <3 RBC/hpf   Bacteria, UA RARE RARE   Squamous Epithelial / LPF FEW (A) RARE  Heparin level (unfractionated)     Status: Abnormal   Collection Time: 09/10/14 10:00 PM  Result Value Ref Range   Heparin Unfractionated 0.20 (L) 0.30 - 0.70 IU/mL    Comment:        IF HEPARIN RESULTS ARE BELOW EXPECTED VALUES, AND PATIENT DOSAGE HAS BEEN CONFIRMED, SUGGEST FOLLOW UP TESTING OF ANTITHROMBIN III LEVELS.   Troponin I     Status: None   Collection Time: 09/10/14 10:00 PM  Result Value Ref Range   Troponin I <0.30 <0.30 ng/mL    Comment:        Due to the release kinetics of cTnI, a negative result within the first hours of the onset of symptoms does not rule out myocardial infarction  with certainty. If  myocardial infarction is still suspected, repeat the test at appropriate intervals.   Troponin I     Status: None   Collection Time: 09/11/14  5:00 AM  Result Value Ref Range   Troponin I <0.30 <0.30 ng/mL    Comment:        Due to the release kinetics of cTnI, a negative result within the first hours of the onset of symptoms does not rule out myocardial infarction with certainty. If myocardial infarction is still suspected, repeat the test at appropriate intervals.   Heparin level (unfractionated)     Status: None   Collection Time: 09/11/14  5:00 AM  Result Value Ref Range   Heparin Unfractionated 0.48 0.30 - 0.70 IU/mL    Comment:        IF HEPARIN RESULTS ARE BELOW EXPECTED VALUES, AND PATIENT DOSAGE HAS BEEN CONFIRMED, SUGGEST FOLLOW UP TESTING OF ANTITHROMBIN III LEVELS.   Basic metabolic panel     Status: Abnormal   Collection Time: 09/11/14  5:00 AM  Result Value Ref Range   Sodium 141 137 - 147 mEq/L   Potassium 4.9 3.7 - 5.3 mEq/L    Comment: DELTA CHECK NOTED   Chloride 101 96 - 112 mEq/L   CO2 27 19 - 32 mEq/L   Glucose, Bld 176 (H) 70 - 99 mg/dL   BUN 20 6 - 23 mg/dL   Creatinine, Ser 1.44 (H) 0.50 - 1.35 mg/dL   Calcium 9.4 8.4 - 10.5 mg/dL   GFR calc non Af Amer 44 (L) >90 mL/min   GFR calc Af Amer 51 (L) >90 mL/min    Comment: (NOTE) The eGFR has been calculated using the CKD EPI equation. This calculation has not been validated in all clinical situations. eGFR's persistently <90 mL/min signify possible Chronic Kidney Disease.    Anion gap 13 5 - 15  CBC     Status: Abnormal   Collection Time: 09/11/14  5:00 AM  Result Value Ref Range   WBC 6.6 4.0 - 10.5 K/uL   RBC 2.99 (L) 4.22 - 5.81 MIL/uL   Hemoglobin 9.3 (L) 13.0 - 17.0 g/dL   HCT 26.9 (L) 39.0 - 52.0 %   MCV 90.0 78.0 - 100.0 fL   MCH 31.1 26.0 - 34.0 pg   MCHC 34.6 30.0 - 36.0 g/dL   RDW 16.9 (H) 11.5 - 15.5 %   Platelets 110 (L) 150 - 400 K/uL    Comment: SPECIMEN CHECKED FOR  CLOTS PLATELET COUNT CONFIRMED BY SMEAR     ABGS No results for input(s): PHART, PO2ART, TCO2, HCO3 in the last 72 hours.  Invalid input(s): PCO2 CULTURES Recent Results (from the past 240 hour(s))  MRSA PCR Screening     Status: None   Collection Time: 09/10/14  5:00 PM  Result Value Ref Range Status   MRSA by PCR NEGATIVE NEGATIVE Final    Comment:        The GeneXpert MRSA Assay (FDA approved for NASAL specimens only), is one component of a comprehensive MRSA colonization surveillance program. It is not intended to diagnose MRSA infection nor to guide or monitor treatment for MRSA infections.    Studies/Results: Ct Angio Chest Pe W/cm &/or Wo Cm  09/10/2014   CLINICAL DATA:  Chest pain.  Chest heaviness.  Pulmonary embolism.  EXAM: CT ANGIOGRAPHY CHEST WITH CONTRAST  TECHNIQUE: Multidetector CT imaging of the chest was performed using the standard protocol during bolus administration of intravenous contrast. Multiplanar  CT image reconstructions and MIPs were obtained to evaluate the vascular anatomy.  CONTRAST:  123mL OMNIPAQUE IOHEXOL 350 MG/ML SOLN  COMPARISON:  08/15/2012 chest CT.  FINDINGS: Bones: No aggressive osseous lesions. Median sternotomy. No displaced rib fractures.  Cardiovascular: Technically adequate study. Negative for pulmonary embolus. Aortic and branch vessel atherosclerosis.  Lungs: Scattered areas of subsegmental atelectasis with dependent atelectasis in the lungs. Paraseptal emphysema at the apices. Mild interlobular septal thickening is present at the apices, compatible with interstitial pulmonary edema.  New 4 mm RIGHT upper lobe pulmonary nodule (image 39 series 5).  Central airways: Tenacious secretions in the RIGHT dependent trachea.  Effusions: Small LEFT pleural effusion.  Lymphadenopathy: Prominent hilar lymph void tissue is present which is symmetric bilaterally without discretely enlarged lymph nodes. Given prior chest radiograph and presence of  interstitial pulmonary edema, this is probably congestive. No axillary adenopathy. No mediastinal adenopathy is present.  Esophagus: Normal.  Upper abdomen: Cholecystectomy.  No acute abnormality.  Other: None.  Review of the MIP images confirms the above findings.  IMPRESSION: 1. Negative for pulmonary embolism or acute abnormality. 2. Scattered areas of atelectasis in the lungs compatible with low volumes. 3. Constellation of findings compatible with mild CHF with probable interstitial pulmonary edema. Small LEFT pleural effusion. 4. New 4 mm RIGHT upper lobe pulmonary nodule. Given risk factors for bronchogenic carcinoma, follow-up chest CT at 1 year is recommended. This recommendation follows the consensus statement: Guidelines for Management of Small Pulmonary Nodules Detected on CT Scans: A Statement from the [place] as published in Radiology 2005; 237:395-400. 5. Apical paraseptal emphysema.   Electronically Signed   By: Dereck Ligas M.D.   On: 09/10/2014 20:26   Dg Chest Port 1 View  09/10/2014   CLINICAL DATA:  Shortness of breath, wheezing, chills today with anterior chest pain; history of diabetes, CHF, and malignancy  EXAM: PORTABLE CHEST - 1 VIEW  COMPARISON:  Portable chest x-ray of March 23, 2014  FINDINGS: The lungs are adequately inflated. The interstitial markings are coarse but stable. The cardiac silhouette is normal in size. The pulmonary vascularity is prominent centrally and exhibits mild chronic cephalization. There is no pleural effusion. The patient has undergone previous median sternotomy.  IMPRESSION: There is no alveolar pneumonia. Coarse interstitial lung markings are slightly more conspicuous than on the previous study and suggests low-grade pulmonary edema. When the patient can tolerate the procedure, a PA and lateral chest x-ray would be of value.   Electronically Signed   By: David  Martinique   On: 09/10/2014 13:58    Medications:  Prior to Admission:   Prescriptions prior to admission  Medication Sig Dispense Refill Last Dose  . acetaminophen (TYLENOL) 500 MG tablet Take 500 mg by mouth every 6 (six) hours as needed for moderate pain.   Past Month at Unknown time  . albuterol (PROVENTIL HFA;VENTOLIN HFA) 108 (90 BASE) MCG/ACT inhaler Inhale 2 puffs into the lungs every 6 (six) hours as needed for wheezing. 1 Inhaler 2 09/09/2014 at Unknown time  . amiodarone (PACERONE) 200 MG tablet Take 0.5 tablets (100 mg total) by mouth daily. (Patient taking differently: Take 200 mg by mouth daily. ) 30 tablet 5 09/10/2014 at Unknown time  . amLODipine (NORVASC) 5 MG tablet Take 5 mg by mouth daily.   09/10/2014 at Unknown time  . aspirin EC 325 MG tablet Take 325 mg by mouth daily.   09/10/2014 at Unknown time  . diazepam (VALIUM) 5 MG tablet Take one  tablet by mouth every 6 hours as needed for muscle spasms (Patient taking differently: Take 10 mg by mouth every 6 (six) hours as needed for anxiety. Take two tablets ($RemoveBefo'10mg'pzBmhvgjpEY$ ) by mouth every 6 hours as needed for muscle spasms) 120 tablet 5 09/10/2014 at Unknown time  . furosemide (LASIX) 40 MG tablet Take 20 mg by mouth daily as needed for fluid.    09/09/2014 at Unknown time  . HYDROcodone-acetaminophen (NORCO/VICODIN) 5-325 MG per tablet Take 1 by mouth every 6 hours as needed for pain. *MAX APAP=3GM/24 HRS- ALL SOURCES* 120 tablet 0 09/10/2014 at Unknown time  . ipratropium-albuterol (DUONEB) 0.5-2.5 (3) MG/3ML SOLN Take 3 mLs by nebulization every 6 (six) hours as needed (shortness of breath). 360 mL 5 UNKNOWN  . isosorbide mononitrate (IMDUR) 30 MG 24 hr tablet Take 1 tablet (30 mg total) by mouth daily. 30 tablet 12 09/10/2014 at Unknown time  . LYRICA 150 MG capsule Take 150 mg by mouth daily.   09/10/2014 at Unknown time  . metFORMIN (GLUCOPHAGE) 500 MG tablet Take 500 mg by mouth 2 (two) times daily with a meal.   09/10/2014 at Unknown time  . metoprolol tartrate (LOPRESSOR) 25 MG tablet Take 25 mg by  mouth 2 (two) times daily.     09/10/2014 at 0800  . Multiple Vitamin (MULTIVITAMIN) tablet Take 1 tablet by mouth daily.     09/09/2014 at Unknown time  . nitroGLYCERIN (NITROSTAT) 0.4 MG SL tablet Place 1 tablet (0.4 mg total) under the tongue every 5 (five) minutes x 3 doses as needed for chest pain (up to 3 doses). 25 tablet 3 09/10/2014 at Unknown time  . potassium chloride SA (K-DUR,KLOR-CON) 20 MEQ tablet Take 1 tablet (20 mEq total) by mouth daily. 30 tablet 12 09/09/2014 at Unknown time  . Red Yeast Rice 600 MG TABS Take 1 tablet by mouth daily.     09/09/2014 at Unknown time  . silodosin (RAPAFLO) 8 MG CAPS capsule Take 8 mg by mouth daily with breakfast.     09/10/2014 at Unknown time  . [DISCONTINUED] pregabalin (LYRICA) 75 MG capsule Take 1 capsule (75 mg total) by mouth daily. (Patient not taking: Reported on 09/10/2014) 30 capsule 5 Taking   Scheduled: . amiodarone  100 mg Oral Daily  . amitriptyline  30 mg Oral QHS  . amLODipine  5 mg Oral Daily  . aspirin EC  325 mg Oral Daily  . furosemide  40 mg Intravenous Q12H  . insulin aspart  0-15 Units Subcutaneous TID WC  . isosorbide mononitrate  30 mg Oral Daily  . metoprolol tartrate  25 mg Oral BID  . sodium chloride  3 mL Intravenous Q12H  . sodium chloride  3 mL Intravenous Q12H   Continuous:  DEY:CXKGYJ chloride, acetaminophen **OR** acetaminophen, alum & mag hydroxide-simeth, HYDROcodone-acetaminophen, levalbuterol, morphine injection, nitroGLYCERIN, sodium chloride  Assesment: He was admitted with congestive heart failure and acute pulmonary edema. He had atrial fibrillation with rapid ventricular response. There was concern that he might have had a pulmonary embolus but CT scan was negative. He is much improved. Active Problems:   CHF (congestive heart failure)   Acute pulmonary edema    Plan: Transfer from ICU to discontinue heparin and recheck hemoglobin level later today    LOS: 1 day   [name]  L 09/11/2014, 7:57 AM

## 2014-09-11 NOTE — Progress Notes (Signed)
ANTICOAGULATION CONSULT NOTE - follow up  Pharmacy Consult for Heparin Indication: pulmonary embolus  Allergies  Allergen Reactions  . Statins Other (See Comments)    Causes legs to hurt.   . Tape     Causes skin to peel off.    Patient Measurements: Height: 5' 11.5" (181.6 cm) Weight: 188 lb 7.9 oz (85.5 kg) IBW/kg (Calculated) : 76.45  Vital Signs: Temp: 98.1 F (36.7 C) (12/16 0000) Temp Source: Oral (12/16 0000) BP: 115/45 mmHg (12/16 0700) Pulse Rate: 65 (12/16 0700)  Labs:  Recent Labs  09/09/14 0834  09/10/14 1315 09/10/14 1700 09/10/14 2200 09/11/14 0500  HGB 7.6*  --  10.5*  --   --  9.3*  HCT 22.1*  --  31.7*  --   --  26.9*  PLT 108*  --  100*  --   --  110*  HEPARINUNFRC  --   --   --   --  0.20* 0.48  CREATININE  --   --  1.15  --   --  1.44*  TROPONINI  --   < > <0.30 <0.30 <0.30 <0.30  < > = values in this interval not displayed.  Estimated Creatinine Clearance: 44.3 mL/min (by C-G formula based on Cr of 1.44).  Medical History: Past Medical History  Diagnosis Date  . Essential hypertension, benign   . COPD (chronic obstructive pulmonary disease)   . Coronary atherosclerosis of native coronary artery     Multivessel status post CABG 1996  . Arthritis   . Atrial fibrillation     Coumadin stopped 12/2011 due to anemia  . Dysphagia   . Anemia     Requiring blood transfusions  . Carotid stenosis     Dr Scot Dock   . Diabetes mellitus, type 2   . Mixed hyperlipidemia     Statin intolerant  . Hematuria     Felt related to Foley insertion  . Cardiomyopathy     LVEF 45-50%  . Melena     EGD & Colonoscopy 09/2011 revealed gastritis, erosions, scars, diverticula, 8 colon polyps (largest 1.6cm, not removed 2/2 anticoagulation), small AVM in transverse colon  . History of tobacco abuse     Quit 1992  . Anxiety   . GERD (gastroesophageal reflux disease)   . Myocardial infarction   . Thrombocytopenia   . Myelodysplastic syndrome with 5 q minus  10/17/2012    On Revlimid 5 mg daily 21 days on and 7 days off  . Aortic stenosis     Mild  . CKD (chronic kidney disease) stage 3, GFR 30-59 ml/min   . Pneumonia 11/2013    history of  . Blood transfusion without reported diagnosis    Medications:  Scheduled:  . amiodarone  100 mg Oral Daily  . amitriptyline  30 mg Oral QHS  . amLODipine  5 mg Oral Daily  . aspirin EC  325 mg Oral Daily  . furosemide  40 mg Intravenous Q12H  . insulin aspart  0-15 Units Subcutaneous TID WC  . isosorbide mononitrate  30 mg Oral Daily  . metoprolol tartrate  25 mg Oral BID  . sodium chloride  3 mL Intravenous Q12H  . sodium chloride  3 mL Intravenous Q12H   Assessment:  Heparin 4000 unit bolus, infusion at 1050 units/hour started by ED MD due to concern for patient's risk factors  for pulmonary embolism or heart attack. have discussed his care with Dr. Luan Pulling who agreed should be evaluated for pulmonary  embolism. The CT scanner at that time was not working correctly for angiogram test. Heparin infusion continued when patient admitted to ICU.  No bleeding noted.   Heparin level is therapeutic after rate increase.  H/H trending down some.  PLTs appear stable.  Goal of Therapy:  Heparin level 0.3-0.7 units/ml Monitor platelets by anticoagulation protocol: Yes   Plan:   Continue Heparin at 1200 units/hour  Heparin level daily  CBC daily while on Heparin  F/U plan for ongoing anticoagulation  [place] Crane, Michall Noffke A 09/11/2014,7:41 AM

## 2014-09-11 NOTE — Care Management Note (Addendum)
    Page 1 of 1   09/12/2014     10:36:06 AM CARE MANAGEMENT NOTE 09/12/2014  Patient:  [name], [name]   Account Number:  [id number]  Date Initiated:  09/11/2014  Documentation initiated by:  Jolene Provost  Subjective/Objective Assessment:   Pt is from home with self care. Pt has OP PT scheduled for next Monday. Pt has no HH services, DME's or medication needs prior to admission. Pt plans to discharge home with self care.     Action/Plan:   No CM needs identified.   Anticipated DC Date:  09/12/2014   Anticipated DC Plan:  [place]  CM consult      Choice offered to / List presented to:             Status of service:  Completed, signed off Medicare Important Message given?   (If response is "NO", the following Medicare IM given date fields will be blank) Date Medicare IM given:   Medicare IM given by:   Date Additional Medicare IM given:   Additional Medicare IM given by:    Discharge Disposition:  HOME/SELF CARE  Per UR Regulation:  Reviewed for med. necessity/level of care/duration of stay  If discussed at [place] of Stay Meetings, dates discussed:    Comments:  09/12/14 [place], RN BSN CM Pt discharging home today after blood transfusion. Pt denies any need for [facility] services. No CM needs noted.  09/11/2014 [place], RN, MSN, [facility]

## 2014-09-11 NOTE — Consult Note (Signed)
Reason for Consult:Chest pain, CHF Referring Physician: Luan Pulling Primary Cardiologist: Vernell Morgans [name] is an 80 y.o. male.  HPI: This is an 80 year old male patient of Dr. Dorris Carnes who has a  history of CAD S/P CABG in 1996, myoview 2010-no ischemia, Atrial fibrillation on Amiodarone(Coumadin stopped because of GI bleed in 2013), HTN, dyslipidemia, PVD. 2Decho 11/29/13 EF 55-60%(improved from 45-50%) with Grade 2 diastolic dysfunction, moderate AS. Last seen by [place] in hospital 01/2014 with CHF and chest pain. Patient receives chronic transfusions secondary to myelodysplastic syndrome. In March he sustained a NSTEMI and at that time Dr. Domenic Polite discussed the case with Dr. Burt Knack who felt patient had poor targets for revascularization and given his overall comorbidities, medical management was favored.  Patient was admitted yesterday with CHF and chest pain during a transfusion. Chest pain was relieved with sublingual nitroglycerin. Patient states that he usually gets transfused every 2 weeks, but they had him wait 3 weeks this time. He's had 3 admissions with the same thing, each when his hemoglobin drops below 7.0. Patient lost his wife 3 weeks ago. Sunday night he ate before he went to bed and had some chest tightness at 3 am relieved with 1 NTG SL. This is the first episode of chest pain he's had in a long time. He says it usually happens if he eats before bed. He's been eating bologna, and pork and beans out of the can. Troponins negative. BNP 1122. Chest CT new 4 mm RUL pulmonary nodule. Patient diuresed 1000 cc. He's feeling much better.Is not very active at home.    Past Medical History  Diagnosis Date  . Essential hypertension, benign   . COPD (chronic obstructive pulmonary disease)   . Coronary atherosclerosis of native coronary artery     Multivessel status post CABG 1996  . Arthritis   . Atrial fibrillation     Coumadin stopped 12/2011 due to anemia  . Dysphagia   . Anemia    Requiring blood transfusions  . Carotid stenosis     Dr Scot Dock   . Diabetes mellitus, type 2   . Mixed hyperlipidemia     Statin intolerant  . Hematuria     Felt related to Foley insertion  . Cardiomyopathy     LVEF 45-50%  . Melena     EGD & Colonoscopy 09/2011 revealed gastritis, erosions, scars, diverticula, 8 colon polyps (largest 1.6cm, not removed 2/2 anticoagulation), small AVM in transverse colon  . History of tobacco abuse     Quit 1992  . Anxiety   . GERD (gastroesophageal reflux disease)   . Myocardial infarction   . Thrombocytopenia   . Myelodysplastic syndrome with 5 q minus 10/17/2012    On Revlimid 5 mg daily 21 days on and 7 days off  . Aortic stenosis     Mild  . CKD (chronic kidney disease) stage 3, GFR 30-59 ml/min   . Pneumonia 11/2013    history of  . Blood transfusion without reported diagnosis     Past Surgical History  Procedure Laterality Date  . Knee surgery  1960    Right knee cartilage  . Rotator cuff repair  1990    right   . Inguinal hernia repair  2000 and 2010    left  . Cholecystectomy  05/2010    Lap chole with biologic mesh repair/reinforcement by  Dr Rise Patience  . Back surgery  02/2006    ruptured disk,  post op diskitis infection requiring prolonged hospital  stay and  surgical I & D  . Tonsillectomy    . Esophagogastroduodenoscopy  10/08/2011    Procedure: ESOPHAGOGASTRODUODENOSCOPY (EGD);  Surgeon: [name], MD;  Location: [facility];  Service: Endoscopy;  Laterality: N/A;  . Colonoscopy  10/08/2011    Procedure: COLONOSCOPY;  Surgeon: [name], MD;  Location: [facility];  Service: Endoscopy;  Laterality: N/A;  . Cataract extraction, bilateral    . Peg placement  07/2006    placed due to prolonged infection, poor po intake, malnutrition during several week hospital stay resulting from ruptured disc that became infected following surgery  . Colonoscopy  01/14/2012    Procedure: COLONOSCOPY;  Surgeon: [name],  MD;  Location: [facility];  Service: Endoscopy;  Laterality: N/A;  945  . Coronary artery bypass graft  1996    CABG x 4  . Ventral hernia repair    . Colonoscopy  04/20/2012    Procedure: COLONOSCOPY;  Surgeon: [name], MD;  Location: [facility];  Service: Endoscopy;  Laterality: N/A;  1030  . Pr vein bypass graft,aorto-fem-pop  1996  . Spine surgery    . Joint replacement  1960    Right Knee  . Lymph node biopsy  08/15/2012    Procedure: LYMPH NODE BIOPSY;  Surgeon: Scherry Ran, MD;  Location: [facility];  Service: General;  Laterality: Left;  Cervical Lymph Node Bx in Minor Room  . Bone marrow biopsy    . Bone marrow aspiration      Family History  Problem Relation Age of Onset  . Heart disease Sister   . Cancer Sister   . Hyperlipidemia Sister   . Hypertension Sister   . Heart disease Brother     Heart Diseast before age 22  . Coronary artery disease Mother   . Cancer Father     Stomach  . Anesthesia problems Neg Hx   . Hypotension Neg Hx   . Malignant hyperthermia Neg Hx   . Pseudochol deficiency Neg Hx     Social History:  reports that he quit smoking about 23 years ago. His smoking use included Cigarettes. He has a 80 pack-year smoking history. He has never used smokeless tobacco. He reports that he does not drink alcohol or use illicit drugs.  Allergies:  Allergies  Allergen Reactions  . Statins Other (See Comments)    Causes legs to hurt.   . Tape     Causes skin to peel off.     Medications:  Scheduled Meds: . amiodarone  100 mg Oral Daily  . amitriptyline  30 mg Oral QHS  . amLODipine  5 mg Oral Daily  . aspirin EC  325 mg Oral Daily  . furosemide  40 mg Intravenous Q12H  . insulin aspart  0-15 Units Subcutaneous TID WC  . isosorbide mononitrate  30 mg Oral Daily  . metoprolol tartrate  25 mg Oral BID  . sodium chloride  3 mL Intravenous Q12H  . sodium chloride  3 mL Intravenous Q12H   Continuous Infusions:  PRN Meds:.sodium  chloride, acetaminophen **OR** acetaminophen, alum & mag hydroxide-simeth, HYDROcodone-acetaminophen, levalbuterol, morphine injection, nitroGLYCERIN, sodium chloride   Results for orders placed or performed during the hospital encounter of 09/10/14 (from the past 48 hour(s))  Troponin I     Status: None   Collection Time: 09/10/14  1:15 PM  Result Value Ref Range   Troponin I <0.30 <0.30 ng/mL    Comment:  Due to the release kinetics of cTnI, a negative result within the first hours of the onset of symptoms does not rule out myocardial infarction with certainty. If myocardial infarction is still suspected, repeat the test at appropriate intervals.   CBC with Differential     Status: Abnormal   Collection Time: 09/10/14  1:15 PM  Result Value Ref Range   WBC 2.2 (L) 4.0 - 10.5 K/uL   RBC 3.41 (L) 4.22 - 5.81 MIL/uL   Hemoglobin 10.5 (L) 13.0 - 17.0 g/dL    Comment: DELTA CHECK NOTED   HCT 31.7 (L) 39.0 - 52.0 %   MCV 93.0 78.0 - 100.0 fL   MCH 30.8 26.0 - 34.0 pg   MCHC 33.1 30.0 - 36.0 g/dL   RDW 16.9 (H) 11.5 - 15.5 %   Platelets 100 (L) 150 - 400 K/uL    Comment: SPECIMEN CHECKED FOR CLOTS CONSISTENT WITH PREVIOUS RESULT    Neutrophils Relative % 51 43 - 77 %   Neutro Abs 1.1 (L) 1.7 - 7.7 K/uL   Lymphocytes Relative 37 12 - 46 %   Lymphs Abs 0.8 0.7 - 4.0 K/uL   Monocytes Relative 2 (L) 3 - 12 %   Monocytes Absolute 0.1 0.1 - 1.0 K/uL   Eosinophils Relative 9 (H) 0 - 5 %   Eosinophils Absolute 0.2 0.0 - 0.7 K/uL   Basophils Relative 1 0 - 1 %   Basophils Absolute 0.0 0.0 - 0.1 K/uL  Basic metabolic panel     Status: Abnormal   Collection Time: 09/10/14  1:15 PM  Result Value Ref Range   Sodium 147 137 - 147 mEq/L   Potassium 3.5 (L) 3.7 - 5.3 mEq/L   Chloride 109 96 - 112 mEq/L   CO2 26 19 - 32 mEq/L   Glucose, Bld 116 (H) 70 - 99 mg/dL   BUN 14 6 - 23 mg/dL   Creatinine, Ser 1.15 0.50 - 1.35 mg/dL   Calcium 8.5 8.4 - 10.5 mg/dL   GFR calc non Af Amer 58  (L) >90 mL/min   GFR calc Af Amer 67 (L) >90 mL/min    Comment: (NOTE) The eGFR has been calculated using the CKD EPI equation. This calculation has not been validated in all clinical situations. eGFR's persistently <90 mL/min signify possible Chronic Kidney Disease.    Anion gap 12 5 - 15  Pro b natriuretic peptide (BNP)     Status: Abnormal   Collection Time: 09/10/14  1:15 PM  Result Value Ref Range   Pro B Natriuretic peptide (BNP) 1122.0 (H) 0 - 450 pg/mL  MRSA PCR Screening     Status: None   Collection Time: 09/10/14  5:00 PM  Result Value Ref Range   MRSA by PCR NEGATIVE NEGATIVE    Comment:        The GeneXpert MRSA Assay (FDA approved for NASAL specimens only), is one component of a comprehensive MRSA colonization surveillance program. It is not intended to diagnose MRSA infection nor to guide or monitor treatment for MRSA infections.   Troponin I     Status: None   Collection Time: 09/10/14  5:00 PM  Result Value Ref Range   Troponin I <0.30 <0.30 ng/mL    Comment:        Due to the release kinetics of cTnI, a negative result within the first hours of the onset of symptoms does not rule out myocardial infarction with certainty. If myocardial infarction is still  suspected, repeat the test at appropriate intervals.   Glucose, capillary     Status: Abnormal   Collection Time: 09/10/14  5:16 PM  Result Value Ref Range   Glucose-Capillary 256 (H) 70 - 99 mg/dL  Transfusion reaction     Status: None   Collection Time: 09/10/14  6:30 PM  Result Value Ref Range   Post RXN DAT IgG NEG    DAT C3 NEG Performed at [facility]     Path interp tx rxn      NO EVIDENCE OF HEMOLYTIC REACTION BBCALL TO CATHERINE,LI MD AT 2200 ON 09/10/2014.  Urinalysis with microscopic     Status: Abnormal   Collection Time: 09/10/14  6:50 PM  Result Value Ref Range   Color, Urine YELLOW YELLOW   APPearance CLEAR CLEAR   Specific Gravity, Urine 1.015 1.005 - 1.030   pH 5.0  5.0 - 8.0   Glucose, UA 250 (A) NEGATIVE mg/dL   Hgb urine dipstick TRACE (A) NEGATIVE   Bilirubin Urine NEGATIVE NEGATIVE   Ketones, ur NEGATIVE NEGATIVE mg/dL   Protein, ur NEGATIVE NEGATIVE mg/dL   Urobilinogen, UA 0.2 0.0 - 1.0 mg/dL   Nitrite NEGATIVE NEGATIVE   Leukocytes, UA NEGATIVE NEGATIVE   WBC, UA 0-2 <3 WBC/hpf   RBC / HPF 0-2 <3 RBC/hpf   Bacteria, UA RARE RARE   Squamous Epithelial / LPF FEW (A) RARE  Heparin level (unfractionated)     Status: Abnormal   Collection Time: 09/10/14 10:00 PM  Result Value Ref Range   Heparin Unfractionated 0.20 (L) 0.30 - 0.70 IU/mL    Comment:        IF HEPARIN RESULTS ARE BELOW EXPECTED VALUES, AND PATIENT DOSAGE HAS BEEN CONFIRMED, SUGGEST FOLLOW UP TESTING OF ANTITHROMBIN III LEVELS.   Troponin I     Status: None   Collection Time: 09/10/14 10:00 PM  Result Value Ref Range   Troponin I <0.30 <0.30 ng/mL    Comment:        Due to the release kinetics of cTnI, a negative result within the first hours of the onset of symptoms does not rule out myocardial infarction with certainty. If myocardial infarction is still suspected, repeat the test at appropriate intervals.   Troponin I     Status: None   Collection Time: 09/11/14  5:00 AM  Result Value Ref Range   Troponin I <0.30 <0.30 ng/mL    Comment:        Due to the release kinetics of cTnI, a negative result within the first hours of the onset of symptoms does not rule out myocardial infarction with certainty. If myocardial infarction is still suspected, repeat the test at appropriate intervals.   Heparin level (unfractionated)     Status: None   Collection Time: 09/11/14  5:00 AM  Result Value Ref Range   Heparin Unfractionated 0.48 0.30 - 0.70 IU/mL    Comment:        IF HEPARIN RESULTS ARE BELOW EXPECTED VALUES, AND PATIENT DOSAGE HAS BEEN CONFIRMED, SUGGEST FOLLOW UP TESTING OF ANTITHROMBIN III LEVELS.   Basic metabolic panel     Status: Abnormal    Collection Time: 09/11/14  5:00 AM  Result Value Ref Range   Sodium 141 137 - 147 mEq/L   Potassium 4.9 3.7 - 5.3 mEq/L    Comment: DELTA CHECK NOTED   Chloride 101 96 - 112 mEq/L   CO2 27 19 - 32 mEq/L   Glucose, Bld 176 (H)  70 - 99 mg/dL   BUN 20 6 - 23 mg/dL   Creatinine, Ser 1.44 (H) 0.50 - 1.35 mg/dL   Calcium 9.4 8.4 - 10.5 mg/dL   GFR calc non Af Amer 44 (L) >90 mL/min   GFR calc Af Amer 51 (L) >90 mL/min    Comment: (NOTE) The eGFR has been calculated using the CKD EPI equation. This calculation has not been validated in all clinical situations. eGFR's persistently <90 mL/min signify possible Chronic Kidney Disease.    Anion gap 13 5 - 15  CBC     Status: Abnormal   Collection Time: 09/11/14  5:00 AM  Result Value Ref Range   WBC 6.6 4.0 - 10.5 K/uL   RBC 2.99 (L) 4.22 - 5.81 MIL/uL   Hemoglobin 9.3 (L) 13.0 - 17.0 g/dL   HCT 26.9 (L) 39.0 - 52.0 %   MCV 90.0 78.0 - 100.0 fL   MCH 31.1 26.0 - 34.0 pg   MCHC 34.6 30.0 - 36.0 g/dL   RDW 16.9 (H) 11.5 - 15.5 %   Platelets 110 (L) 150 - 400 K/uL    Comment: SPECIMEN CHECKED FOR CLOTS PLATELET COUNT CONFIRMED BY SMEAR   Glucose, capillary     Status: Abnormal   Collection Time: 09/11/14  7:30 AM  Result Value Ref Range   Glucose-Capillary 165 (H) 70 - 99 mg/dL   Comment 1 Notify RN     Ct Angio Chest Pe W/cm &/or Wo Cm  09/10/2014   CLINICAL DATA:  Chest pain.  Chest heaviness.  Pulmonary embolism.  EXAM: CT ANGIOGRAPHY CHEST WITH CONTRAST  TECHNIQUE: Multidetector CT imaging of the chest was performed using the standard protocol during bolus administration of intravenous contrast. Multiplanar CT image reconstructions and MIPs were obtained to evaluate the vascular anatomy.  CONTRAST:  115mL OMNIPAQUE IOHEXOL 350 MG/ML SOLN  COMPARISON:  08/15/2012 chest CT.  FINDINGS: Bones: No aggressive osseous lesions. Median sternotomy. No displaced rib fractures.  Cardiovascular: Technically adequate study. Negative for pulmonary  embolus. Aortic and branch vessel atherosclerosis.  Lungs: Scattered areas of subsegmental atelectasis with dependent atelectasis in the lungs. Paraseptal emphysema at the apices. Mild interlobular septal thickening is present at the apices, compatible with interstitial pulmonary edema.  New 4 mm RIGHT upper lobe pulmonary nodule (image 39 series 5).  Central airways: Tenacious secretions in the RIGHT dependent trachea.  Effusions: Small LEFT pleural effusion.  Lymphadenopathy: Prominent hilar lymph void tissue is present which is symmetric bilaterally without discretely enlarged lymph nodes. Given prior chest radiograph and presence of interstitial pulmonary edema, this is probably congestive. No axillary adenopathy. No mediastinal adenopathy is present.  Esophagus: Normal.  Upper abdomen: Cholecystectomy.  No acute abnormality.  Other: None.  Review of the MIP images confirms the above findings.  IMPRESSION: 1. Negative for pulmonary embolism or acute abnormality. 2. Scattered areas of atelectasis in the lungs compatible with low volumes. 3. Constellation of findings compatible with mild CHF with probable interstitial pulmonary edema. Small LEFT pleural effusion. 4. New 4 mm RIGHT upper lobe pulmonary nodule. Given risk factors for bronchogenic carcinoma, follow-up chest CT at 1 year is recommended. This recommendation follows the consensus statement: Guidelines for Management of Small Pulmonary Nodules Detected on CT Scans: A Statement from the [employer] as published in Radiology 2005; 237:395-400. 5. Apical paraseptal emphysema.   Electronically Signed   By: Dereck Ligas M.D.   On: 09/10/2014 20:26   Dg Chest Port 1 View  09/10/2014  CLINICAL DATA:  Shortness of breath, wheezing, chills today with anterior chest pain; history of diabetes, CHF, and malignancy  EXAM: PORTABLE CHEST - 1 VIEW  COMPARISON:  Portable chest x-ray of March 23, 2014  FINDINGS: The lungs are adequately inflated. The  interstitial markings are coarse but stable. The cardiac silhouette is normal in size. The pulmonary vascularity is prominent centrally and exhibits mild chronic cephalization. There is no pleural effusion. The patient has undergone previous median sternotomy.  IMPRESSION: There is no alveolar pneumonia. Coarse interstitial lung markings are slightly more conspicuous than on the previous study and suggests low-grade pulmonary edema. When the patient can tolerate the procedure, a PA and lateral chest x-ray would be of value.   Electronically Signed   By: David  Martinique   On: 09/10/2014 13:58    ROS  See HPI Eyes: Negative Ears:Negative for hearing loss, tinnitus Cardiovascular: Negative for  palpitations,irregular heartbeat,  near-syncope, orthopnea, paroxysmal nocturnal dyspnea and syncope,edema, claudication, cyanosis,.  Respiratory:   Negative for cough, hemoptysis, shortness of breath, sleep disturbances due to breathing, sputum production and wheezing.   Endocrine: Negative for cold intolerance and heat intolerance.  Hematologic/Lymphatic: Negative for adenopathy and bleeding problem. Does not bruise/bleed easily.  Musculoskeletal: Negative.   Gastrointestinal: Negative for nausea, vomiting, reflux, abdominal pain, diarrhea, constipation.   Genitourinary: Negative for bladder incontinence, dysuria, flank pain, frequency, hematuria, hesitancy, nocturia and urgency.  Neurological: Negative.  Allergic/Immunologic: Negative for environmental allergies.  Blood pressure 115/45, pulse 65, temperature 98.1 F (36.7 C), temperature source Oral, resp. rate 15, height 5' 11.5" (1.816 m), weight 188 lb 7.9 oz (85.5 kg), SpO2 100 %. Physical Exam  PHYSICAL EXAM: Well-nournished, in no acute distress. Neck: Slight increase JVD, HJR,bilateral Bruit, no thyroid enlargement Lungs:Decreased breath sounds with fine crackles at bases.  Cardiovascular: RRR, PMI not displaced,Normal S1, Decreased S2, 3/6  systolic murmur LSB RSB, no gallops, bruit, thrill, or heave. Abdomen: BS normal. Soft without organomegaly, masses, lesions or tenderness. Extremities: without cyanosis, clubbing or edema. Good distal pulses bilateral SKin: Warm, no lesions or rashes  Musculoskeletal: No deformities Neuro: no focal signs    Carotid dopplers 09/2012: IMPRESSION: Severe near occlusive narrowing of the right internal carotid artery.  This has progressed since the prior study.   50-69% stenosis in the left internal carotid artery.  This has also progressed.   2Decho 11/28/13: Study Conclusions  - Left ventricle: The cavity size was normal. Wall thickness   was increased in a pattern of moderate LVH. Systolic   function was normal. The estimated ejection fraction was   in the range of 55% to 60%. There is hypokinesis of the   basalinferior myocardium. Features are consistent with a   pseudonormal left ventricular filling pattern, with   concomitant abnormal relaxation and increased filling   pressure (grade 2 diastolic dysfunction). - Aortic valve: Mildly calcified annulus. Trileaflet;   moderately calcified leaflets. There was moderate   stenosis. Mean gradient: 53mm Hg (S). Peak gradient: 35mm   Hg (S). Valve area: 1.02cm^2(VTI). LVOT/AV VTI ratio 0.29. - Mitral valve: Calcified annulus. Mildly thickened leaflets   . Mild regurgitation. - Left atrium: The atrium was moderately dilated. - Right ventricle: The cavity size was mildly dilated. - Right atrium: The atrium was moderately dilated. Central   venous pressure: 103mm Hg (est). - Atrial septum: No defect or patent foramen ovale was   identified. - Tricuspid valve: Mild regurgitation. - Pulmonary arteries: PA peak pressure: 20mm Hg (S). - Pericardium,  extracardiac: There was no pericardial   effusion. Impressions:  - Moderate LVH with LVEF 55-60%, basal inferior hypokinesis,   grade 2 diastolic dysfunction. Moderate biatrial   enlargement.  MAC with thickened mitral valve and mild   mitral regurgitation. Overall moderate aortic valve   stenosis as described above. Mild tricuspid regurgitation   with moderately elevated PASP of 48 mmHg. mercury.  EKG: Sinus Tachycardia with LVH, old anteroseptal infarct, no acute change.  Assessment/Plan:  Acute on chronic diastolic CHF in setting of anemia, transfusion, HTN and chest pain. Also has moderate AS on echo in 11/2013. Considering keeping Hbg higher, transfuse in between units of blood. Troponins negative and EKG unchanged. Fairly stable at home. EF 55-60% 11/2013. On Lasix 40 mg IV BID.  CAD S/P CABG 1996. Last Ischemic workup negative 2014. Dr. Burt Knack felt pt had poor targets for revascularization 01/2014 and with other complicities recommended medical therapy.  PAF on Amiodarone(coumadin stopped 2015 due to GI bleed)  Moderate Aortic Stenosis echo 11/2013  COPD  Myelodysplastic syndrome requiring regular transfusions  Renal insufficiency.   Carotid Stenosis: reorder dopplers  Ermalinda Barrios 09/11/2014, 8:04 AM

## 2014-09-11 NOTE — Progress Notes (Signed)
PT TRANSFERRING TO 340 ON TELEMETRY. HR 84 IN NSR. O2 SAT 95% ON ROOM AIR. DENIES SOB OR CHEST PAIN. FOLEY CATH PATENT DRAINING CLEAR YELLOW URINE. TRANSFER REPORT CALLED TO BECKY BROWER RN ON 300.

## 2014-09-12 LAB — CBC
HCT: 26.5 % — ABNORMAL LOW (ref 39.0–52.0)
Hemoglobin: 9.1 g/dL — ABNORMAL LOW (ref 13.0–17.0)
MCH: 31.5 pg (ref 26.0–34.0)
MCHC: 34.3 g/dL (ref 30.0–36.0)
MCV: 91.7 fL (ref 78.0–100.0)
Platelets: 92 10*3/uL — ABNORMAL LOW (ref 150–400)
RBC: 2.89 MIL/uL — ABNORMAL LOW (ref 4.22–5.81)
RDW: 17.5 % — AB (ref 11.5–15.5)
WBC: 5.7 10*3/uL (ref 4.0–10.5)

## 2014-09-12 LAB — PREPARE RBC (CROSSMATCH)

## 2014-09-12 LAB — GLUCOSE, CAPILLARY
Glucose-Capillary: 120 mg/dL — ABNORMAL HIGH (ref 70–99)
Glucose-Capillary: 208 mg/dL — ABNORMAL HIGH (ref 70–99)

## 2014-09-12 MED ORDER — ACETAMINOPHEN 325 MG PO TABS
650.0000 mg | ORAL_TABLET | Freq: Once | ORAL | Status: AC
  Administered 2014-09-12: 650 mg via ORAL
  Filled 2014-09-12: qty 2

## 2014-09-12 MED ORDER — SODIUM CHLORIDE 0.9 % IV SOLN: Freq: Once | INTRAVENOUS | Status: AC

## 2014-09-12 MED ORDER — DIPHENHYDRAMINE HCL 25 MG PO CAPS
25.0000 mg | ORAL_CAPSULE | Freq: Once | ORAL | Status: AC
Start: 2014-09-12 — End: 2014-09-12
  Administered 2014-09-12: 25 mg via ORAL
  Filled 2014-09-12: qty 1

## 2014-09-12 MED ORDER — SODIUM CHLORIDE 0.9 % IV SOLN
Freq: Once | INTRAVENOUS | Status: AC
  Administered 2014-09-12: 09:00:00 via INTRAVENOUS

## 2014-09-12 NOTE — Discharge Summary (Signed)
Physician Discharge Summary  Patient ID: [name] MRN: [id number] DOB/AGE: 12-16-1931 80 y.o. Primary Care Physician:Anacleto Batterman L, MD Admit date: 09/10/2014 Discharge date: 09/12/2014    Discharge Diagnoses:   Active Problems:   Coronary atherosclerosis   Atrial fibrillation   Peripheral vascular disease   Myelodysplastic syndrome with 5 q minus   Acute on chronic systolic heart failure   Chronic kidney disease (CKD) stage G3b/A2, moderately decreased glomerular filtration rate (GFR) between 30-44 mL/min/1.73 square meter and albuminuria creatinine ratio between 30-299 mg/g   CHF (congestive heart failure)   Acute pulmonary edema   Acute on chronic diastolic congestive heart failure   Carotid stenosis   Coronary artery disease involving coronary bypass graft of native heart with other forms of angina pectoris   Aortic stenosis     Medication List    STOP taking these medications        amitriptyline 10 MG tablet  Commonly known as:  ELAVIL     amLODipine 5 MG tablet  Commonly known as:  NORVASC      TAKE these medications        acetaminophen 500 MG tablet  Commonly known as:  TYLENOL  Take 500 mg by mouth every 6 (six) hours as needed for moderate pain.     albuterol 108 (90 BASE) MCG/ACT inhaler  Commonly known as:  PROVENTIL HFA;VENTOLIN HFA  Inhale 2 puffs into the lungs every 6 (six) hours as needed for wheezing.     amiodarone 200 MG tablet  Commonly known as:  PACERONE  Take 0.5 tablets (100 mg total) by mouth daily.     aspirin EC 325 MG tablet  Take 325 mg by mouth daily.     diazepam 5 MG tablet  Commonly known as:  VALIUM  Take one tablet by mouth every 6 hours as needed for muscle spasms     furosemide 40 MG tablet  Commonly known as:  LASIX  Take 20 mg by mouth daily as needed for fluid.     HYDROcodone-acetaminophen 5-325 MG per tablet  Commonly known as:  NORCO/VICODIN  Take 1 by mouth every 6 hours as needed for pain. *MAX  APAP=3GM/24 HRS- ALL SOURCES*     ipratropium-albuterol 0.5-2.5 (3) MG/3ML Soln  Commonly known as:  DUONEB  Take 3 mLs by nebulization every 6 (six) hours as needed (shortness of breath).     isosorbide mononitrate 30 MG 24 hr tablet  Commonly known as:  IMDUR  Take 1 tablet (30 mg total) by mouth daily.     LYRICA 150 MG capsule  Generic drug:  pregabalin  Take 150 mg by mouth daily.     metFORMIN 500 MG tablet  Commonly known as:  GLUCOPHAGE  Take 500 mg by mouth 2 (two) times daily with a meal.     metoprolol tartrate 25 MG tablet  Commonly known as:  LOPRESSOR  Take 25 mg by mouth 2 (two) times daily.     multivitamin tablet  Take 1 tablet by mouth daily.     nitroGLYCERIN 0.4 MG SL tablet  Commonly known as:  NITROSTAT  Place 1 tablet (0.4 mg total) under the tongue every 5 (five) minutes x 3 doses as needed for chest pain (up to 3 doses).     potassium chloride SA 20 MEQ tablet  Commonly known as:  K-DUR,KLOR-CON  Take 1 tablet (20 mEq total) by mouth daily.     RAPAFLO 8 MG Caps capsule  Generic drug:  silodosin  Take 8 mg by mouth daily with breakfast.     Red Yeast Rice 600 MG Tabs  Take 1 tablet by mouth daily.        Discharged Condition: Improved    Consults: Cardiology  Significant Diagnostic Studies: Ct Angio Chest Pe W/cm &/or Wo Cm  09/10/2014   CLINICAL DATA:  Chest pain.  Chest heaviness.  Pulmonary embolism.  EXAM: CT ANGIOGRAPHY CHEST WITH CONTRAST  TECHNIQUE: Multidetector CT imaging of the chest was performed using the standard protocol during bolus administration of intravenous contrast. Multiplanar CT image reconstructions and MIPs were obtained to evaluate the vascular anatomy.  CONTRAST:  141mL OMNIPAQUE IOHEXOL 350 MG/ML SOLN  COMPARISON:  08/15/2012 chest CT.  FINDINGS: Bones: No aggressive osseous lesions. Median sternotomy. No displaced rib fractures.  Cardiovascular: Technically adequate study. Negative for pulmonary embolus. Aortic  and branch vessel atherosclerosis.  Lungs: Scattered areas of subsegmental atelectasis with dependent atelectasis in the lungs. Paraseptal emphysema at the apices. Mild interlobular septal thickening is present at the apices, compatible with interstitial pulmonary edema.  New 4 mm RIGHT upper lobe pulmonary nodule (image 39 series 5).  Central airways: Tenacious secretions in the RIGHT dependent trachea.  Effusions: Small LEFT pleural effusion.  Lymphadenopathy: Prominent hilar lymph void tissue is present which is symmetric bilaterally without discretely enlarged lymph nodes. Given prior chest radiograph and presence of interstitial pulmonary edema, this is probably congestive. No axillary adenopathy. No mediastinal adenopathy is present.  Esophagus: Normal.  Upper abdomen: Cholecystectomy.  No acute abnormality.  Other: None.  Review of the MIP images confirms the above findings.  IMPRESSION: 1. Negative for pulmonary embolism or acute abnormality. 2. Scattered areas of atelectasis in the lungs compatible with low volumes. 3. Constellation of findings compatible with mild CHF with probable interstitial pulmonary edema. Small LEFT pleural effusion. 4. New 4 mm RIGHT upper lobe pulmonary nodule. Given risk factors for bronchogenic carcinoma, follow-up chest CT at 1 year is recommended. This recommendation follows the consensus statement: Guidelines for Management of Small Pulmonary Nodules Detected on CT Scans: A Statement from the [place] as published in Radiology 2005; 237:395-400. 5. Apical paraseptal emphysema.   Electronically Signed   By: Dereck Ligas M.D.   On: 09/10/2014 20:26   US Carotid Bilateral  09/11/2014   CLINICAL DATA:  80 year old male with a history of carotid stenosis  Cardiovascular risk factors include coronary artery disease with prior myocardial infarction, hyperlipidemia, diabetes, tobacco use, history of peripheral vascular disease.  EXAM: BILATERAL CAROTID DUPLEX  ULTRASOUND  TECHNIQUE: Pearline Cables scale imaging, color Doppler and duplex ultrasound were performed of bilateral carotid and vertebral arteries in the neck.  COMPARISON:  10/24/2012  FINDINGS: Criteria: Quantification of carotid stenosis is based on velocity parameters that correlate the residual internal carotid diameter with NASCET-based stenosis levels, using the diameter of the distal internal carotid lumen as the denominator for stenosis measurement.  The following velocity measurements were obtained:  RIGHT  ICA:  Systolic 528 cm/sec, Diastolic 83 cm/sec  CCA:  88 cm/sec  SYSTOLIC ICA/CCA RATIO:  4.5  DIASTOLIC ICA/CCA RATIO:  41.3  ECA:  86 cm/sec  LEFT  ICA:  Systolic 244 cm/sec, Diastolic 35 cm/sec  CCA:  010 cm/sec  SYSTOLIC ICA/CCA RATIO:  1.5  DIASTOLIC ICA/CCA RATIO:  1.9  ECA:  232 cm/sec  RIGHT CAROTID ARTERY: Heterogeneous and partially calcified plaque of the right common carotid artery. Intermediate waveform is maintained. Heterogeneous and partially calcified plaque extends into the right  carotid bulb and into the proximal right ICA. Calcifications are predominantly on the far field wall. Low resistance waveform of the right ICA.  RIGHT VERTEBRAL ARTERY: Antegrade flow with low resistance waveform.  LEFT CAROTID ARTERY: Heterogeneous plaque of the left common carotid artery with scattered calcifications. Heterogeneous and partially calcified plaque of the left carotid bulb extending into the left ICA. Left CCA maintains intermediate resistance waveform. Left ICA maintains low resistance waveform.  LEFT VERTEBRAL ARTERY:  Antegrade flow with low resistance waveform.  IMPRESSION: Right: Color duplex indicates heterogeneous and calcified plaque of the carotid bulb and ICA contributing to greater than 70% stenosis by duplex criteria.  Left: Color duplex indicates moderate heterogeneous and partially calcified plaque on the left, contributing to 50%- 69% stenosis by duplex criteria.  Signed,  Dulcy Fanny.  Earleen Newport, DO  [facility]  [facility]   Electronically Signed   By: Corrie Mckusick D.O.   On: 09/11/2014 14:28   Dg Chest Port 1 View  09/10/2014   CLINICAL DATA:  Shortness of breath, wheezing, chills today with anterior chest pain; history of diabetes, CHF, and malignancy  EXAM: PORTABLE CHEST - 1 VIEW  COMPARISON:  Portable chest x-ray of March 23, 2014  FINDINGS: The lungs are adequately inflated. The interstitial markings are coarse but stable. The cardiac silhouette is normal in size. The pulmonary vascularity is prominent centrally and exhibits mild chronic cephalization. There is no pleural effusion. The patient has undergone previous median sternotomy.  IMPRESSION: There is no alveolar pneumonia. Coarse interstitial lung markings are slightly more conspicuous than on the previous study and suggests low-grade pulmonary edema. When the patient can tolerate the procedure, a PA and lateral chest x-ray would be of value.   Electronically Signed   By: David  Martinique   On: 09/10/2014 13:58    Lab Results: Basic Metabolic Panel:  Recent Labs  09/10/14 1315 09/11/14 0500  NA 147 141  K 3.5* 4.9  CL 109 101  CO2 26 27  GLUCOSE 116* 176*  BUN 14 20  CREATININE 1.15 1.44*  CALCIUM 8.5 9.4   Liver Function Tests: No results for input(s): AST, ALT, ALKPHOS, BILITOT, PROT, ALBUMIN in the last 72 hours.   CBC:  Recent Labs  09/10/14 1315 09/11/14 0500 09/11/14 1544 09/12/14 0533  WBC 2.2* 6.6  --  5.7  NEUTROABS 1.1*  --   --   --   HGB 10.5* 9.3* 9.6* 9.1*  HCT 31.7* 26.9* 27.8* 26.5*  MCV 93.0 90.0  --  91.7  PLT 100* 110*  --  92*    Recent Results (from the past 240 hour(s))  MRSA PCR Screening     Status: None   Collection Time: 09/10/14  5:00 PM  Result Value Ref Range Status   MRSA by PCR NEGATIVE NEGATIVE Final    Comment:        The GeneXpert MRSA Assay (FDA approved for NASAL specimens only), is one component of  a comprehensive MRSA colonization surveillance program. It is not intended to diagnose MRSA infection nor to guide or monitor treatment for MRSA infections.      Hospital Course: This is an 80 year old who has multiple medical problems including myelodysplastic disorder with 5 every minus and who is no longer a candidate for treatment of that because he developed renal failure with treatment. He has been being treated with blood transfusions about every 2 weeks. He went in for a transfusion and then during the second  unit of blood at about the time it was to be finished he developed chest discomfort and took nitroglycerin and then developed severe shortness of breath. He was transferred from the cancer clinic to the emergency department where he was noted to be in pulmonary edema. This has happened to him in the past. He was treated with IV Lasix and BiPAP briefly and improved. He ruled out for myocardial infarction. Cardiology consultation was obtained and it was felt that he should have discontinuation of amlodipine but no other changes. His hemoglobin level was 9.1 on the morning of discharge and after discussion with the patient and with oncology we elected to give him 1 more unit of blood before he went home to try to keep him from having more trouble with angina pectoris because of low hemoglobin level  Discharge Exam: Blood pressure 98/65, pulse 60, temperature 98.2 F (36.8 C), temperature source Oral, resp. rate 14, height 5' 11.5" (1.816 m), weight 84.46 kg (186 lb 3.2 oz), SpO2 94 %. He is awake and alert. He is in atrial fibrillation. His chest is clear. He looks comfortable  Disposition: Home he does not want home health services      Discharge Instructions    Discharge patient    Complete by:  As directed   After blood transfusion             Signed: Zeinab Rodwell L   09/12/2014, 8:48 AM

## 2014-09-12 NOTE — Progress Notes (Signed)
Subjective: He feels well and has no complaints. He is not short of breath. He has not had any chest pain.  Objective: Vital signs in last 24 hours: Temp:  [97.6 F (36.4 C)-98.2 F (36.8 C)] 98.2 F (36.8 C) (12/17 0635) Pulse Rate:  [60-66] 60 (12/17 0635) Resp:  [14-16] 14 (12/17 0635) BP: (92-115)/(25-65) 98/65 mmHg (12/17 0635) SpO2:  [93 %-96 %] 94 % (12/17 0635) Weight:  [84.46 kg (186 lb 3.2 oz)] 84.46 kg (186 lb 3.2 oz) (12/17 1941) Weight change: -1.724 kg (-3 lb 12.8 oz) Last BM Date: 09/11/14  Intake/Output from previous day: 12/16 0701 - 12/17 0700 In: -  Out: [place] [Urine:3750]  PHYSICAL EXAM General appearance: alert, cooperative and no distress Resp: clear to auscultation bilaterally Cardio: irregularly irregular rhythm GI: soft, non-tender; bowel sounds normal; no masses,  no organomegaly Extremities: extremities normal, atraumatic, no cyanosis or edema  Lab Results:  Results for orders placed or performed during the hospital encounter of 09/10/14 (from the past 48 hour(s))  Troponin I     Status: None   Collection Time: 09/10/14  1:15 PM  Result Value Ref Range   Troponin I <0.30 <0.30 ng/mL    Comment:        Due to the release kinetics of cTnI, a negative result within the first hours of the onset of symptoms does not rule out myocardial infarction with certainty. If myocardial infarction is still suspected, repeat the test at appropriate intervals.   CBC with Differential     Status: Abnormal   Collection Time: 09/10/14  1:15 PM  Result Value Ref Range   WBC 2.2 ([name]) 4.0 - 10.5 K/uL   RBC 3.41 ([name]) 4.22 - 5.81 MIL/uL   Hemoglobin 10.5 ([name]) 13.0 - 17.0 g/dL    Comment: DELTA CHECK NOTED   HCT 31.7 ([name]) 39.0 - 52.0 %   MCV 93.0 78.0 - 100.0 fL   MCH 30.8 26.0 - 34.0 pg   MCHC 33.1 30.0 - 36.0 g/dL   RDW 16.9 (H) 11.5 - 15.5 %   Platelets 100 ([name]) 150 - 400 K/uL    Comment: SPECIMEN CHECKED FOR CLOTS CONSISTENT WITH PREVIOUS RESULT     Neutrophils Relative % 51 43 - 77 %   Neutro Abs 1.1 ([name]) 1.7 - 7.7 K/uL   Lymphocytes Relative 37 12 - 46 %   Lymphs Abs 0.8 0.7 - 4.0 K/uL   Monocytes Relative 2 ([name]) 3 - 12 %   Monocytes Absolute 0.1 0.1 - 1.0 K/uL   Eosinophils Relative 9 (H) 0 - 5 %   Eosinophils Absolute 0.2 0.0 - 0.7 K/uL   Basophils Relative 1 0 - 1 %   Basophils Absolute 0.0 0.0 - 0.1 K/uL  Basic metabolic panel     Status: Abnormal   Collection Time: 09/10/14  1:15 PM  Result Value Ref Range   Sodium 147 137 - 147 mEq/[name]   Potassium 3.5 ([name]) 3.7 - 5.3 mEq/[name]   Chloride 109 96 - 112 mEq/[name]   CO2 26 19 - 32 mEq/[name]   Glucose, Bld 116 (H) 70 - 99 mg/dL   BUN 14 6 - 23 mg/dL   Creatinine, Ser 1.15 0.50 - 1.35 mg/dL   Calcium 8.5 8.4 - 10.5 mg/dL   GFR calc non Af Amer 58 ([name]) >90 mL/min   GFR calc Af Amer 67 ([name]) >90 mL/min    Comment: (NOTE) The eGFR has been calculated using the CKD EPI equation. This calculation  has not been validated in all clinical situations. eGFR's persistently <90 mL/min signify possible Chronic Kidney Disease.    Anion gap 12 5 - 15  Pro b natriuretic peptide (BNP)     Status: Abnormal   Collection Time: 09/10/14  1:15 PM  Result Value Ref Range   Pro B Natriuretic peptide (BNP) 1122.0 (H) 0 - 450 pg/mL  MRSA PCR Screening     Status: None   Collection Time: 09/10/14  5:00 PM  Result Value Ref Range   MRSA by PCR NEGATIVE NEGATIVE    Comment:        The GeneXpert MRSA Assay (FDA approved for NASAL specimens only), is one component of a comprehensive MRSA colonization surveillance program. It is not intended to diagnose MRSA infection nor to guide or monitor treatment for MRSA infections.   Troponin I     Status: None   Collection Time: 09/10/14  5:00 PM  Result Value Ref Range   Troponin I <0.30 <0.30 ng/mL    Comment:        Due to the release kinetics of cTnI, a negative result within the first hours of the onset of symptoms does not rule out myocardial infarction with  certainty. If myocardial infarction is still suspected, repeat the test at appropriate intervals.   Glucose, capillary     Status: Abnormal   Collection Time: 09/10/14  5:16 PM  Result Value Ref Range   Glucose-Capillary 256 (H) 70 - 99 mg/dL  Transfusion reaction     Status: None   Collection Time: 09/10/14  6:30 PM  Result Value Ref Range   Post RXN DAT IgG NEG    DAT C3 NEG Performed at [facility] interp tx rxn      NO EVIDENCE OF HEMOLYTIC REACTION BBCALL TO CATHERINE,LI MD AT 2200 ON 09/10/2014.  Urinalysis with microscopic     Status: Abnormal   Collection Time: 09/10/14  6:50 PM  Result Value Ref Range   Color, Urine YELLOW YELLOW   APPearance CLEAR CLEAR   Specific Gravity, Urine 1.015 1.005 - 1.030   pH 5.0 5.0 - 8.0   Glucose, UA 250 (A) NEGATIVE mg/dL   Hgb urine dipstick TRACE (A) NEGATIVE   Bilirubin Urine NEGATIVE NEGATIVE   Ketones, ur NEGATIVE NEGATIVE mg/dL   Protein, ur NEGATIVE NEGATIVE mg/dL   Urobilinogen, UA 0.2 0.0 - 1.0 mg/dL   Nitrite NEGATIVE NEGATIVE   Leukocytes, UA NEGATIVE NEGATIVE   WBC, UA 0-2 <3 WBC/hpf   RBC / HPF 0-2 <3 RBC/hpf   Bacteria, UA RARE RARE   Squamous Epithelial / LPF FEW (A) RARE  Heparin level (unfractionated)     Status: Abnormal   Collection Time: 09/10/14 10:00 PM  Result Value Ref Range   Heparin Unfractionated 0.20 ([name]) 0.30 - 0.70 IU/mL    Comment:        IF HEPARIN RESULTS ARE BELOW EXPECTED VALUES, AND PATIENT DOSAGE HAS BEEN CONFIRMED, SUGGEST FOLLOW UP TESTING OF ANTITHROMBIN III LEVELS.   Troponin I     Status: None   Collection Time: 09/10/14 10:00 PM  Result Value Ref Range   Troponin I <0.30 <0.30 ng/mL    Comment:        Due to the release kinetics of cTnI, a negative result within the first hours of the onset of symptoms does not rule out myocardial infarction with certainty. If myocardial infarction is still suspected, repeat the test at appropriate intervals.  Troponin I      Status: None   Collection Time: 09/11/14  5:00 AM  Result Value Ref Range   Troponin I <0.30 <0.30 ng/mL    Comment:        Due to the release kinetics of cTnI, a negative result within the first hours of the onset of symptoms does not rule out myocardial infarction with certainty. If myocardial infarction is still suspected, repeat the test at appropriate intervals.   Heparin level (unfractionated)     Status: None   Collection Time: 09/11/14  5:00 AM  Result Value Ref Range   Heparin Unfractionated 0.48 0.30 - 0.70 IU/mL    Comment:        IF HEPARIN RESULTS ARE BELOW EXPECTED VALUES, AND PATIENT DOSAGE HAS BEEN CONFIRMED, SUGGEST FOLLOW UP TESTING OF ANTITHROMBIN III LEVELS.   Basic metabolic panel     Status: Abnormal   Collection Time: 09/11/14  5:00 AM  Result Value Ref Range   Sodium 141 137 - 147 mEq/[name]   Potassium 4.9 3.7 - 5.3 mEq/[name]    Comment: DELTA CHECK NOTED   Chloride 101 96 - 112 mEq/[name]   CO2 27 19 - 32 mEq/[name]   Glucose, Bld 176 (H) 70 - 99 mg/dL   BUN 20 6 - 23 mg/dL   Creatinine, Ser 1.44 (H) 0.50 - 1.35 mg/dL   Calcium 9.4 8.4 - 10.5 mg/dL   GFR calc non Af Amer 44 ([name]) >90 mL/min   GFR calc Af Amer 51 ([name]) >90 mL/min    Comment: (NOTE) The eGFR has been calculated using the CKD EPI equation. This calculation has not been validated in all clinical situations. eGFR's persistently <90 mL/min signify possible Chronic Kidney Disease.    Anion gap 13 5 - 15  CBC     Status: Abnormal   Collection Time: 09/11/14  5:00 AM  Result Value Ref Range   WBC 6.6 4.0 - 10.5 K/uL   RBC 2.99 ([name]) 4.22 - 5.81 MIL/uL   Hemoglobin 9.3 ([name]) 13.0 - 17.0 g/dL   HCT 26.9 ([name]) 39.0 - 52.0 %   MCV 90.0 78.0 - 100.0 fL   MCH 31.1 26.0 - 34.0 pg   MCHC 34.6 30.0 - 36.0 g/dL   RDW 16.9 (H) 11.5 - 15.5 %   Platelets 110 ([name]) 150 - 400 K/uL    Comment: SPECIMEN CHECKED FOR CLOTS PLATELET COUNT CONFIRMED BY SMEAR   Glucose, capillary     Status: Abnormal   Collection Time:  09/11/14  7:30 AM  Result Value Ref Range   Glucose-Capillary 165 (H) 70 - 99 mg/dL   Comment 1 Notify RN   Glucose, capillary     Status: Abnormal   Collection Time: 09/11/14 11:49 AM  Result Value Ref Range   Glucose-Capillary 179 (H) 70 - 99 mg/dL   Comment 1 Notify RN   Hemoglobin and hematocrit, blood     Status: Abnormal   Collection Time: 09/11/14  3:44 PM  Result Value Ref Range   Hemoglobin 9.6 ([name]) 13.0 - 17.0 g/dL   HCT 27.8 ([name]) 39.0 - 52.0 %  Glucose, capillary     Status: Abnormal   Collection Time: 09/11/14  4:31 PM  Result Value Ref Range   Glucose-Capillary 155 (H) 70 - 99 mg/dL  Glucose, capillary     Status: Abnormal   Collection Time: 09/11/14  9:23 PM  Result Value Ref Range   Glucose-Capillary 219 (H) 70 - 99 mg/dL  Comment 1 Notify RN   CBC     Status: Abnormal   Collection Time: 09/12/14  5:33 AM  Result Value Ref Range   WBC 5.7 4.0 - 10.5 K/uL   RBC 2.89 ([name]) 4.22 - 5.81 MIL/uL   Hemoglobin 9.1 ([name]) 13.0 - 17.0 g/dL   HCT 26.5 ([name]) 39.0 - 52.0 %   MCV 91.7 78.0 - 100.0 fL   MCH 31.5 26.0 - 34.0 pg   MCHC 34.3 30.0 - 36.0 g/dL   RDW 17.5 (H) 11.5 - 15.5 %   Platelets 92 ([name]) 150 - 400 K/uL    Comment: SPECIMEN CHECKED FOR CLOTS CONSISTENT WITH PREVIOUS RESULT   Glucose, capillary     Status: Abnormal   Collection Time: 09/12/14  7:41 AM  Result Value Ref Range   Glucose-Capillary 120 (H) 70 - 99 mg/dL   Comment 1 Notify RN     ABGS No results for input(s): PHART, PO2ART, TCO2, HCO3 in the last 72 hours.  Invalid input(s): PCO2 CULTURES Recent Results (from the past 240 hour(s))  MRSA PCR Screening     Status: None   Collection Time: 09/10/14  5:00 PM  Result Value Ref Range Status   MRSA by PCR NEGATIVE NEGATIVE Final    Comment:        The GeneXpert MRSA Assay (FDA approved for NASAL specimens only), is one component of a comprehensive MRSA colonization surveillance program. It is not intended to diagnose MRSA infection nor to guide  or monitor treatment for MRSA infections.    Studies/Results: Ct Angio Chest Pe W/cm &/or Wo Cm  09/10/2014   CLINICAL DATA:  Chest pain.  Chest heaviness.  Pulmonary embolism.  EXAM: CT ANGIOGRAPHY CHEST WITH CONTRAST  TECHNIQUE: Multidetector CT imaging of the chest was performed using the standard protocol during bolus administration of intravenous contrast. Multiplanar CT image reconstructions and MIPs were obtained to evaluate the vascular anatomy.  CONTRAST:  195mL OMNIPAQUE IOHEXOL 350 MG/ML SOLN  COMPARISON:  08/15/2012 chest CT.  FINDINGS: Bones: No aggressive osseous lesions. Median sternotomy. No displaced rib fractures.  Cardiovascular: Technically adequate study. Negative for pulmonary embolus. Aortic and branch vessel atherosclerosis.  Lungs: Scattered areas of subsegmental atelectasis with dependent atelectasis in the lungs. Paraseptal emphysema at the apices. Mild interlobular septal thickening is present at the apices, compatible with interstitial pulmonary edema.  New 4 mm RIGHT upper lobe pulmonary nodule (image 39 series 5).  Central airways: Tenacious secretions in the RIGHT dependent trachea.  Effusions: Small LEFT pleural effusion.  Lymphadenopathy: Prominent hilar lymph void tissue is present which is symmetric bilaterally without discretely enlarged lymph nodes. Given prior chest radiograph and presence of interstitial pulmonary edema, this is probably congestive. No axillary adenopathy. No mediastinal adenopathy is present.  Esophagus: Normal.  Upper abdomen: Cholecystectomy.  No acute abnormality.  Other: None.  Review of the MIP images confirms the above findings.  IMPRESSION: 1. Negative for pulmonary embolism or acute abnormality. 2. Scattered areas of atelectasis in the lungs compatible with low volumes. 3. Constellation of findings compatible with mild CHF with probable interstitial pulmonary edema. Small LEFT pleural effusion. 4. New 4 mm RIGHT upper lobe pulmonary nodule.  Given risk factors for bronchogenic carcinoma, follow-up chest CT at 1 year is recommended. This recommendation follows the consensus statement: Guidelines for Management of Small Pulmonary Nodules Detected on CT Scans: A Statement from the [employer] as published in Radiology 2005; 237:395-400. 5. Apical paraseptal emphysema.   Electronically Signed  By: Dereck Ligas M.D.   On: 09/10/2014 20:26   US Carotid Bilateral  09/11/2014   CLINICAL DATA:  80 year old male with a history of carotid stenosis  Cardiovascular risk factors include coronary artery disease with prior myocardial infarction, hyperlipidemia, diabetes, tobacco use, history of peripheral vascular disease.  EXAM: BILATERAL CAROTID DUPLEX ULTRASOUND  TECHNIQUE: Pearline Cables scale imaging, color Doppler and duplex ultrasound were performed of bilateral carotid and vertebral arteries in the neck.  COMPARISON:  10/24/2012  FINDINGS: Criteria: Quantification of carotid stenosis is based on velocity parameters that correlate the residual internal carotid diameter with NASCET-based stenosis levels, using the diameter of the distal internal carotid lumen as the denominator for stenosis measurement.  The following velocity measurements were obtained:  RIGHT  ICA:  Systolic 732 cm/sec, Diastolic 83 cm/sec  CCA:  88 cm/sec  SYSTOLIC ICA/CCA RATIO:  4.5  DIASTOLIC ICA/CCA RATIO:  20.2  ECA:  86 cm/sec  LEFT  ICA:  Systolic 542 cm/sec, Diastolic 35 cm/sec  CCA:  706 cm/sec  SYSTOLIC ICA/CCA RATIO:  1.5  DIASTOLIC ICA/CCA RATIO:  1.9  ECA:  232 cm/sec  RIGHT CAROTID ARTERY: Heterogeneous and partially calcified plaque of the right common carotid artery. Intermediate waveform is maintained. Heterogeneous and partially calcified plaque extends into the right carotid bulb and into the proximal right ICA. Calcifications are predominantly on the far field wall. Low resistance waveform of the right ICA.  RIGHT VERTEBRAL ARTERY: Antegrade flow with low resistance  waveform.  LEFT CAROTID ARTERY: Heterogeneous plaque of the left common carotid artery with scattered calcifications. Heterogeneous and partially calcified plaque of the left carotid bulb extending into the left ICA. Left CCA maintains intermediate resistance waveform. Left ICA maintains low resistance waveform.  LEFT VERTEBRAL ARTERY:  Antegrade flow with low resistance waveform.  IMPRESSION: Right: Color duplex indicates heterogeneous and calcified plaque of the carotid bulb and ICA contributing to greater than 70% stenosis by duplex criteria.  Left: Color duplex indicates moderate heterogeneous and partially calcified plaque on the left, contributing to 50%- 69% stenosis by duplex criteria.  Signed,  Dulcy Fanny. Earleen Newport, DO  [facility]  [facility]   Electronically Signed   By: Corrie Mckusick D.O.   On: 09/11/2014 14:28   Dg Chest Port 1 View  09/10/2014   CLINICAL DATA:  Shortness of breath, wheezing, chills today with anterior chest pain; history of diabetes, CHF, and malignancy  EXAM: PORTABLE CHEST - 1 VIEW  COMPARISON:  Portable chest x-ray of March 23, 2014  FINDINGS: The lungs are adequately inflated. The interstitial markings are coarse but stable. The cardiac silhouette is normal in size. The pulmonary vascularity is prominent centrally and exhibits mild chronic cephalization. There is no pleural effusion. The patient has undergone previous median sternotomy.  IMPRESSION: There is no alveolar pneumonia. Coarse interstitial lung markings are slightly more conspicuous than on the previous study and suggests low-grade pulmonary edema. When the patient can tolerate the procedure, a PA and lateral chest x-ray would be of value.   Electronically Signed   By: David  Martinique   On: 09/10/2014 13:58    Medications:  Prior to Admission:  Prescriptions prior to admission  Medication Sig Dispense Refill Last Dose  . acetaminophen (TYLENOL) 500 MG tablet Take 500 mg  by mouth every 6 (six) hours as needed for moderate pain.   Past Month at Unknown time  . albuterol (PROVENTIL HFA;VENTOLIN HFA) 108 (90 BASE) MCG/ACT inhaler Inhale 2 puffs into the  lungs every 6 (six) hours as needed for wheezing. 1 Inhaler 2 09/09/2014 at Unknown time  . amiodarone (PACERONE) 200 MG tablet Take 0.5 tablets (100 mg total) by mouth daily. (Patient taking differently: Take 200 mg by mouth daily. ) 30 tablet 5 09/10/2014 at Unknown time  . amLODipine (NORVASC) 5 MG tablet Take 5 mg by mouth daily.   09/10/2014 at Unknown time  . aspirin EC 325 MG tablet Take 325 mg by mouth daily.   09/10/2014 at Unknown time  . diazepam (VALIUM) 5 MG tablet Take one tablet by mouth every 6 hours as needed for muscle spasms (Patient taking differently: Take 10 mg by mouth every 6 (six) hours as needed for anxiety. Take two tablets ($RemoveBefo'10mg'[id number]$ ) by mouth every 6 hours as needed for muscle spasms) 120 tablet 5 09/10/2014 at Unknown time  . furosemide (LASIX) 40 MG tablet Take 20 mg by mouth daily as needed for fluid.    09/09/2014 at Unknown time  . HYDROcodone-acetaminophen (NORCO/VICODIN) 5-325 MG per tablet Take 1 by mouth every 6 hours as needed for pain. *MAX APAP=3GM/24 HRS- ALL SOURCES* 120 tablet 0 09/10/2014 at Unknown time  . ipratropium-albuterol (DUONEB) 0.5-2.5 (3) MG/3ML SOLN Take 3 mLs by nebulization every 6 (six) hours as needed (shortness of breath). 360 mL 5 UNKNOWN  . isosorbide mononitrate (IMDUR) 30 MG 24 hr tablet Take 1 tablet (30 mg total) by mouth daily. 30 tablet 12 09/10/2014 at Unknown time  . LYRICA 150 MG capsule Take 150 mg by mouth daily.   09/10/2014 at Unknown time  . metFORMIN (GLUCOPHAGE) 500 MG tablet Take 500 mg by mouth 2 (two) times daily with a meal.   09/10/2014 at Unknown time  . metoprolol tartrate (LOPRESSOR) 25 MG tablet Take 25 mg by mouth 2 (two) times daily.     09/10/2014 at 0800  . Multiple Vitamin (MULTIVITAMIN) tablet Take 1 tablet by mouth daily.      09/09/2014 at Unknown time  . nitroGLYCERIN (NITROSTAT) 0.4 MG SL tablet Place 1 tablet (0.4 mg total) under the tongue every 5 (five) minutes x 3 doses as needed for chest pain (up to 3 doses). 25 tablet 3 09/10/2014 at Unknown time  . potassium chloride SA (K-DUR,KLOR-CON) 20 MEQ tablet Take 1 tablet (20 mEq total) by mouth daily. 30 tablet 12 09/09/2014 at Unknown time  . Red Yeast Rice 600 MG TABS Take 1 tablet by mouth daily.     09/09/2014 at Unknown time  . silodosin (RAPAFLO) 8 MG CAPS capsule Take 8 mg by mouth daily with breakfast.     09/10/2014 at Unknown time  . [DISCONTINUED] pregabalin (LYRICA) 75 MG capsule Take 1 capsule (75 mg total) by mouth daily. (Patient not taking: Reported on 09/10/2014) 30 capsule 5 Taking   Scheduled: . sodium chloride   Intravenous Once  . sodium chloride   Intravenous Once  . acetaminophen  650 mg Oral Once  . amiodarone  100 mg Oral Daily  . amitriptyline  30 mg Oral QHS  . aspirin EC  325 mg Oral Daily  . diphenhydrAMINE  25 mg Oral Once  . furosemide  40 mg Intravenous Q12H  . insulin aspart  0-15 Units Subcutaneous TID WC  . isosorbide mononitrate  30 mg Oral Daily  . metoprolol tartrate  25 mg Oral BID  . sodium chloride  3 mL Intravenous Q12H  . sodium chloride  3 mL Intravenous Q12H   Continuous:  PTW:SFKCLE chloride, acetaminophen **OR** acetaminophen, alum &  mag hydroxide-simeth, HYDROcodone-acetaminophen, levalbuterol, morphine injection, nitroGLYCERIN, sodium chloride  Assesment: He was admitted with acute pulmonary edema and acute on chronic systolic and diastolic congestive heart failure. He is known to have coronary artery occlusive disease. He has aortic stenosis. He has myelodysplastic syndrome with 5 every minus. He has required multiple blood transfusions. His hemoglobin level is 9.1 and I discussed that with Kirby Crigler, PA, with the oncology team and we plan to give him 1 more unit of blood before he goes home. Active  Problems:   Coronary atherosclerosis   Atrial fibrillation   Peripheral vascular disease   Myelodysplastic syndrome with 5 q minus   Acute on chronic systolic heart failure   Chronic kidney disease (CKD) stage G3b/A2, moderately decreased glomerular filtration rate (GFR) between 30-44 mL/min/1.73 square meter and albuminuria creatinine ratio between 30-299 mg/g   CHF (congestive heart failure)   Acute pulmonary edema   Acute on chronic diastolic congestive heart failure   Carotid stenosis   Coronary artery disease involving coronary bypass graft of native heart with other forms of angina pectoris   Aortic stenosis    Plan: 1 more unit of blood today and discharge after that    LOS: 2 days   [name] 09/12/2014, 8:46 AM

## 2014-09-13 ENCOUNTER — Ambulatory Visit (HOSPITAL_COMMUNITY): Payer: Medicare Other

## 2014-09-13 LAB — TYPE AND SCREEN
ABO/RH(D): A NEG
Antibody Screen: NEGATIVE
Unit division: 0

## 2014-09-16 ENCOUNTER — Encounter (HOSPITAL_COMMUNITY): Payer: Medicare Other | Attending: Oncology

## 2014-09-16 DIAGNOSIS — D46C Myelodysplastic syndrome with isolated del(5q) chromosomal abnormality: Secondary | ICD-10-CM | POA: Insufficient documentation

## 2014-09-16 LAB — CBC WITH DIFFERENTIAL/PLATELET
BASOS ABS: 0.1 10*3/uL (ref 0.0–0.1)
Basophils Relative: 2 % — ABNORMAL HIGH (ref 0–1)
EOS PCT: 11 % — AB (ref 0–5)
Eosinophils Absolute: 0.5 10*3/uL (ref 0.0–0.7)
HEMATOCRIT: 32.7 % — AB (ref 39.0–52.0)
Hemoglobin: 10.9 g/dL — ABNORMAL LOW (ref 13.0–17.0)
LYMPHS PCT: 28 % (ref 12–46)
Lymphs Abs: 1.3 10*3/uL (ref 0.7–4.0)
MCH: 30.8 pg (ref 26.0–34.0)
MCHC: 33.3 g/dL (ref 30.0–36.0)
MCV: 92.4 fL (ref 78.0–100.0)
MONO ABS: 0.4 10*3/uL (ref 0.1–1.0)
MONOS PCT: 8 % (ref 3–12)
Neutro Abs: 2.4 10*3/uL (ref 1.7–7.7)
Neutrophils Relative %: 51 % (ref 43–77)
Platelets: 110 10*3/uL — ABNORMAL LOW (ref 150–400)
RBC: 3.54 MIL/uL — ABNORMAL LOW (ref 4.22–5.81)
RDW: 17.1 % — AB (ref 11.5–15.5)
WBC: 4.6 10*3/uL (ref 4.0–10.5)

## 2014-09-16 NOTE — Progress Notes (Signed)
LABS FOR CBCD 

## 2014-09-18 ENCOUNTER — Ambulatory Visit (HOSPITAL_COMMUNITY)
Admission: RE | Admit: 2014-09-18 | Discharge: 2014-09-18 | Disposition: A | Discharge status: Home / Self Care | Payer: Medicare Other | Source: Ambulatory Visit | Attending: Oncology | Admitting: Oncology

## 2014-09-18 DIAGNOSIS — R262 Difficulty in walking, not elsewhere classified: Secondary | ICD-10-CM | POA: Diagnosis not present

## 2014-09-18 DIAGNOSIS — J449 Chronic obstructive pulmonary disease, unspecified: Secondary | ICD-10-CM | POA: Diagnosis not present

## 2014-09-18 DIAGNOSIS — R29898 Other symptoms and signs involving the musculoskeletal system: Secondary | ICD-10-CM

## 2014-09-18 DIAGNOSIS — Z5189 Encounter for other specified aftercare: Secondary | ICD-10-CM | POA: Diagnosis not present

## 2014-09-18 DIAGNOSIS — R2689 Other abnormalities of gait and mobility: Secondary | ICD-10-CM

## 2014-09-18 DIAGNOSIS — Z9181 History of falling: Secondary | ICD-10-CM | POA: Diagnosis not present

## 2014-09-18 DIAGNOSIS — Z951 Presence of aortocoronary bypass graft: Secondary | ICD-10-CM | POA: Insufficient documentation

## 2014-09-18 DIAGNOSIS — I10 Essential (primary) hypertension: Secondary | ICD-10-CM | POA: Insufficient documentation

## 2014-09-18 NOTE — Therapy (Signed)
[place], [place], [address] Phone: [phone]   Fax:  [phone]  Physical Therapy Reassessment  Patient Details  Name: [name] MRN: [id number] Date of Birth: 1932/08/01  Encounter Date: 09/18/2014      PT End of Session - 09/18/14 1153    Visit Number 29   Number of Visits 37   Date for PT Re-Evaluation 10/18/14   Authorization Type medicare   Authorization - Visit Number 29   Authorization - Number of Visits 37   PT Start Time 1020   PT Stop Time 1105   PT Time Calculation (min) 45 min      Past Medical History  Diagnosis Date  . Essential hypertension, benign   . COPD (chronic obstructive pulmonary disease)   . Coronary atherosclerosis of native coronary artery     Multivessel status post CABG 1996  . Arthritis   . Atrial fibrillation     Coumadin stopped 12/2011 due to anemia  . Dysphagia   . Anemia     Requiring blood transfusions  . Carotid stenosis     Dr Scot Dock   . Diabetes mellitus, type 2   . Mixed hyperlipidemia     Statin intolerant  . Hematuria     Felt related to Foley insertion  . Cardiomyopathy     LVEF 45-50%  . Melena     EGD & Colonoscopy 09/2011 revealed gastritis, erosions, scars, diverticula, 8 colon polyps (largest 1.6cm, not removed 2/2 anticoagulation), small AVM in transverse colon  . History of tobacco abuse     Quit 1992  . Anxiety   . GERD (gastroesophageal reflux disease)   . Myocardial infarction   . Thrombocytopenia   . Myelodysplastic syndrome with 5 q minus 10/17/2012    On Revlimid 5 mg daily 21 days on and 7 days off  . Aortic stenosis     Mild  . CKD (chronic kidney disease) stage 3, GFR 30-59 ml/min   . Pneumonia 11/2013    history of  . Blood transfusion without reported diagnosis     Past Surgical History  Procedure Laterality Date  . Knee surgery  1960    Right knee cartilage  . Rotator cuff repair  1990    right   . Inguinal  hernia repair  2000 and 2010    left  . Cholecystectomy  05/2010    Lap chole with biologic mesh repair/reinforcement by  Dr Rise Patience  . Back surgery  02/2006    ruptured disk,  post op diskitis infection requiring prolonged hospital stay and  surgical I & D  . Tonsillectomy    . Esophagogastroduodenoscopy  10/08/2011    Procedure: ESOPHAGOGASTRODUODENOSCOPY (EGD);  Surgeon: [name], MD;  [facility];  Service: Endoscopy;  Laterality: N/A;  . Colonoscopy  10/08/2011    Procedure: COLONOSCOPY;  Surgeon: [name], MD;  [facility];  Service: Endoscopy;  Laterality: N/A;  . Cataract extraction, bilateral    . Peg placement  07/2006    placed due to prolonged infection, poor po intake, malnutrition during several week hospital stay resulting from ruptured disc that became infected following surgery  . Colonoscopy  01/14/2012    Procedure: COLONOSCOPY;  Surgeon: [name], MD;  [facility];  Service: Endoscopy;  Laterality: N/A;  945  . Coronary artery bypass graft  1996    CABG x 4  . Ventral hernia repair    .  Colonoscopy  04/20/2012    Procedure: COLONOSCOPY;  Surgeon: [name], MD;  [facility]: [facility];  Service: Endoscopy;  Laterality: N/A;  1030  . Pr vein bypass graft,aorto-fem-pop  1996  . Spine surgery    . Joint replacement  1960    Right Knee  . Lymph node biopsy  08/15/2012    Procedure: LYMPH NODE BIOPSY;  Surgeon: Scherry Ran, MD;  Location: [facility];  Service: General;  Laterality: Left;  Cervical Lymph Node Bx in Minor Room  . Bone marrow biopsy    . Bone marrow aspiration      There were no vitals taken for this visit.  Visit Diagnosis:  Weakness of both legs  Poor balance      Subjective Assessment - 09/18/14 1030    Symptoms Pt has not been to therpay secondary to his wife dying after this he rescheduled his appointments but had to cancel due to having breathing problems.  He was admitted on  12/15 and released on 12/17.  He states that he has tried to do some of his exercises but on the whole has not done anything.   Pertinent History back surgery 2007 the patient got an infection and ended up in the hospital for 72 days and the patient did not walk for almost a year.  ; myelodysplastic syndrom (bone marrow does not make blood). Pt started Chemo a year ago last treatement was July.  Pt needs blood transfusions 2 pints a week for the past 7 months.   How long can you sit comfortably? Able to sit as long he wants to     How long can you stand comfortably? Able to stand for 20 minutes was 5-10   How long can you walk comfortably? able to walk with his RW for an hour was an hour.  Pt would like to be able to walk with his cane but he wears out after about 10 minutes with the cane    Currently in Pain? No/denies          [facility] PT Assessment - 09/18/14 0001    Assessment   Medical Diagnosis Difficutly walking   Next MD Visit Blood work every Monday; Luan Pulling unscheduled    Prior Therapy 2014 for back    Strength   Right Hip Flexion 5/5  4/5   Right Hip Extension 3-/5  was 3-/5   Right Hip ABduction 5/5  was 4+/5    Left Hip Flexion 5/5  was 4/5   Left Hip Extension 2+/5  was 3-/5   Left Hip ABduction 5/5  was 5/5   Right Knee Flexion 3+/5  was 3+/5   Right Knee Extension 5/5  was 4/5   Left Knee Flexion 3+/5  was 4/5   Left Knee Extension 5/5  was 4/5   Right Ankle Dorsiflexion 5/5  was 4/5    Right Ankle Plantar Flexion --  4+/5 was 4/5   Left Ankle Dorsiflexion 5/5  was 4/5   Left Ankle Plantar Flexion --  4+/5 was 4/5   Berg Balance Test   Sit to Stand Able to stand without using hands and stabilize independently   Standing Unsupported Able to stand 2 minutes with supervision   Sitting with Back Unsupported but Feet Supported on Floor or Stool Able to sit safely and securely 2 minutes   Stand to Sit Controls descent by using hands   Transfers Able to transfer  safely, definite need of hands  Standing Unsupported with Eyes Closed Able to stand 10 seconds safely   Standing Ubsupported with Feet Together Able to place feet together independently but unable to hold for 30 seconds   From Standing, Reach Forward with Outstretched Arm Can reach forward >12 cm safely (5")   From Standing Position, Pick up Object from [place] to pick up shoe safely and easily   From Standing Position, Turn to Look Behind Over each Shoulder Looks behind one side only/other side shows less weight shift   Turn 360 Degrees Able to turn 360 degrees safely but slowly   Standing Unsupported, Alternately Place Feet on Step/Stool Able to stand independently and safely and complete 8 steps in 20 seconds   Standing Unsupported, One Foot in Front Able to take small step independently and hold 30 seconds   Standing on One Leg Able to lift leg independently and hold equal to or more than 3 seconds   Total Score 43                  [facility] Adult PT Treatment/Exercise - 09/18/14 0001    Lumbar Exercises: Aerobic   Stationary Bike nustep L5x 6'   Lumbar Exercises: Standing   Other Standing Lumbar Exercises tandem stance x 20 "   Other Standing Lumbar Exercises SLS x 3    Lumbar Exercises: Seated   Other Seated Lumbar Exercises sit to stand x 10    Lumbar Exercises: Prone   Straight Leg Raise 10 reps   Other Prone Lumbar Exercises ham curl 3# B x 10                   PT Short Term Goals - 09/18/14 1158    PT SHORT TERM GOAL #1   Title Pt to be able to stand for 10 minutes without increased pain to be able to make a sandwich for a meal   Time 4   Period Weeks   Status Achieved   PT SHORT TERM GOAL #2   Title Pt to be able to walk for 15 minutes for better health habits   Time 4   Period Weeks   Status Achieved   PT SHORT TERM GOAL #3   Title Pt to walk with 20 degree forward flexion     Time 4   Period Weeks   Status Achieved           PT Long  Term Goals - 09/18/14 1158    PT LONG TERM GOAL #1   Title Pt to be able to stand for 20 minutes to socialize   Time 12   Period Weeks   Status On-going   PT LONG TERM GOAL #2   Title Pt to be comfortable walking inside with a cane   Baseline 08/19/14 : Pt reports use of cane 90% of the time in the home.    Time 12   Status On-going   PT LONG TERM GOAL #3   Title Patient to be able to walk with his walker for 30 minutes at a time   Time 8   Period Weeks   Status Achieved   PT LONG TERM GOAL #4   Title forward bent posture to be decreased to only 10 degree forward bend     Time 8   Status On-going   PT LONG TERM GOAL #5   Title PT to be able to SLS x 10 seconds to reduce risk of falling   Time 12  Period Weeks   Status New               Plan - 09/22/14 1154    Clinical Impression Statement Pt has not returned for 4 weeks secondary to wife passing and pt himself being in the hospital.  PT reassessed all mm are wfl except for B hamstring and B gluteal maximus.  Pt main defict continues to be balance.  PT will continue to benefit from skilled therapy to improve strength and balance to decrease risk of falling.    Rehab Potential Good   PT Frequency 2x / week   PT Duration 4 weeks   PT Treatment/Interventions Balance training;Therapeutic exercise;Therapeutic activities;Gait training;Stair training;Functional mobility training   PT Next Visit Plan focus on balance; strengthening shoud consists of hamstring and glut max only.  Gt train with cane to improve confidence level.           G-Codes - Sep 22, 2014 1205    Functional Assessment Tool Used clinical judgement   Functional Limitation Mobility: Walking and moving around   Mobility: Walking and Moving Around Current Status [phone]) At least 20 percent but less than 40 percent impaired, limited or restricted   Mobility: Walking and Moving Around Goal Status [phone]) At least 40 percent but less than 60 percent impaired,  limited or restricted       Problem List Patient Active Problem List   Diagnosis Date Noted  . Acute on chronic diastolic congestive heart failure   . Carotid stenosis   . Coronary artery disease involving coronary bypass graft of native heart with other forms of angina pectoris   . Aortic stenosis   . Acute pulmonary edema 09/10/2014  . Weakness of both legs 05/09/2014  . Poor balance 05/09/2014  . Difficulty in walking(719.7) 05/09/2014  . Chest pain 03/23/2014  . CHF (congestive heart failure) 02/12/2014  . Exertional dyspnea 02/12/2014  . Chest pain of uncertain etiology 17/91/5056  . Diabetes 01/01/2014  . Chronic kidney disease (CKD) stage G3b/A2, moderately decreased glomerular filtration rate (GFR) between 30-44 mL/min/1.73 square meter and albuminuria creatinine ratio between 30-299 mg/g 12/14/2013  . NSTEMI (non-ST elevated myocardial infarction) 11/28/2013  . HCAP (healthcare-associated pneumonia) 11/27/2013  . Community acquired pneumonia 11/27/2013  . Acute on chronic systolic heart failure 97/94/8016  . Other pancytopenia 01/08/2013  . Non-ST elevation MI (NSTEMI) 01/06/2013  . High output heart failure 10/25/2012  . Myelodysplastic syndrome with 5 q minus 10/17/2012  . History of colonic polyps 02/28/2012  . Anemia 01/10/2012  . Angina effort 01/06/2012  . AVM (arteriovenous malformation) of colon without hemorrhage 01/04/2012  . GI bleeding 10/09/2011  . Long term current use of anticoagulant 12/22/2010  . CAROTID ARTERY STENOSIS 10/16/2010  . DYSLIPIDEMIA 04/11/2009  . Coronary atherosclerosis 04/11/2009  . Atrial fibrillation 04/11/2009  . Peripheral vascular disease 04/11/2009  . OSTEOARTHRITIS 04/11/2009    RUSSELL,CINDY PT 2014/09/22, 12:06 PM  [place], [place], [address] Phone: [phone]   Fax:  [phone]

## 2014-09-18 NOTE — Addendum Note (Signed)
Encounter addended by: Leeroy Cha, PT on: 09/18/2014  1:07 PM<BR>     Documentation filed: Orders

## 2014-09-23 ENCOUNTER — Encounter (HOSPITAL_COMMUNITY): Payer: Medicare Other | Attending: Hematology & Oncology

## 2014-09-23 DIAGNOSIS — C3412 Malignant neoplasm of upper lobe, left bronchus or lung: Secondary | ICD-10-CM

## 2014-09-23 DIAGNOSIS — D46C Myelodysplastic syndrome with isolated del(5q) chromosomal abnormality: Secondary | ICD-10-CM | POA: Insufficient documentation

## 2014-09-23 LAB — CBC WITH DIFFERENTIAL/PLATELET
Basophils Absolute: 0.1 10*3/uL (ref 0.0–0.1)
Basophils Relative: 3 % — ABNORMAL HIGH (ref 0–1)
EOS ABS: 0.4 10*3/uL (ref 0.0–0.7)
Eosinophils Relative: 10 % — ABNORMAL HIGH (ref 0–5)
HCT: 26.8 % — ABNORMAL LOW (ref 39.0–52.0)
Hemoglobin: 9.1 g/dL — ABNORMAL LOW (ref 13.0–17.0)
LYMPHS ABS: 1.2 10*3/uL (ref 0.7–4.0)
LYMPHS PCT: 34 % (ref 12–46)
MCH: 31.6 pg (ref 26.0–34.0)
MCHC: 34 g/dL (ref 30.0–36.0)
MCV: 93.1 fL (ref 78.0–100.0)
Monocytes Absolute: 0.2 10*3/uL (ref 0.1–1.0)
Monocytes Relative: 6 % (ref 3–12)
Neutro Abs: 1.7 10*3/uL (ref 1.7–7.7)
Neutrophils Relative %: 48 % (ref 43–77)
Platelets: 97 10*3/uL — ABNORMAL LOW (ref 150–400)
RBC: 2.88 MIL/uL — AB (ref 4.22–5.81)
RDW: 17.6 % — ABNORMAL HIGH (ref 11.5–15.5)
WBC: 3.5 10*3/uL — AB (ref 4.0–10.5)

## 2014-09-23 NOTE — Progress Notes (Signed)
Lab draw

## 2014-09-25 ENCOUNTER — Encounter (HOSPITAL_COMMUNITY): Payer: Self-pay

## 2014-09-25 ENCOUNTER — Ambulatory Visit (HOSPITAL_COMMUNITY)
Admission: RE | Admit: 2014-09-25 | Discharge: 2014-09-25 | Disposition: A | Discharge status: Home / Self Care | Payer: Medicare Other | Source: Ambulatory Visit | Attending: Hematology and Oncology | Admitting: Hematology and Oncology

## 2014-09-25 DIAGNOSIS — R2689 Other abnormalities of gait and mobility: Secondary | ICD-10-CM

## 2014-09-25 DIAGNOSIS — Z5189 Encounter for other specified aftercare: Secondary | ICD-10-CM | POA: Diagnosis not present

## 2014-09-25 DIAGNOSIS — R29898 Other symptoms and signs involving the musculoskeletal system: Secondary | ICD-10-CM

## 2014-09-25 NOTE — Therapy (Signed)
[place], [place], [address] Phone: [phone]   Fax:  [phone]  Physical Therapy Treatment  Patient Details  Name: [name] MRN: [id number] Date of Birth: May 24, 1932  Encounter Date: 09/25/2014      PT End of Session - 09/25/14 0912    Visit Number 30   Number of Visits 37   Date for PT Re-Evaluation 10/18/14   Authorization Type medicare   Authorization - Visit Number 30   Authorization - Number of Visits 37   PT Start Time 0810   PT Stop Time 0850   PT Time Calculation (min) 40 min   Equipment Utilized During Treatment Gait belt   Activity Tolerance Patient tolerated treatment well   Behavior During Therapy [facility] for tasks assessed/performed      Past Medical History  Diagnosis Date  . Essential hypertension, benign   . COPD (chronic obstructive pulmonary disease)   . Coronary atherosclerosis of native coronary artery     Multivessel status post CABG 1996  . Arthritis   . Atrial fibrillation     Coumadin stopped 12/2011 due to anemia  . Dysphagia   . Anemia     Requiring blood transfusions  . Carotid stenosis     Dr Scot Dock   . Diabetes mellitus, type 2   . Mixed hyperlipidemia     Statin intolerant  . Hematuria     Felt related to Foley insertion  . Cardiomyopathy     LVEF 45-50%  . Melena     EGD & Colonoscopy 09/2011 revealed gastritis, erosions, scars, diverticula, 8 colon polyps (largest 1.6cm, not removed 2/2 anticoagulation), small AVM in transverse colon  . History of tobacco abuse     Quit 1992  . Anxiety   . GERD (gastroesophageal reflux disease)   . Myocardial infarction   . Thrombocytopenia   . Myelodysplastic syndrome with 5 q minus 10/17/2012    On Revlimid 5 mg daily 21 days on and 7 days off  . Aortic stenosis     Mild  . CKD (chronic kidney disease) stage 3, GFR 30-59 ml/min   . Pneumonia 11/2013    history of  . Blood transfusion without reported diagnosis      Past Surgical History  Procedure Laterality Date  . Knee surgery  1960    Right knee cartilage  . Rotator cuff repair  1990    right   . Inguinal hernia repair  2000 and 2010    left  . Cholecystectomy  05/2010    Lap chole with biologic mesh repair/reinforcement by  Dr Rise Patience  . Back surgery  02/2006    ruptured disk,  post op diskitis infection requiring prolonged hospital stay and  surgical I & D  . Tonsillectomy    . Esophagogastroduodenoscopy  10/08/2011    Procedure: ESOPHAGOGASTRODUODENOSCOPY (EGD);  Surgeon: [name], MD;  [facility];  Service: Endoscopy;  Laterality: N/A;  . Colonoscopy  10/08/2011    Procedure: COLONOSCOPY;  Surgeon: [name], MD;  [facility];  Service: Endoscopy;  Laterality: N/A;  . Cataract extraction, bilateral    . Peg placement  07/2006    placed due to prolonged infection, poor po intake, malnutrition during several week hospital stay resulting from ruptured disc that became infected following surgery  . Colonoscopy  01/14/2012    Procedure: COLONOSCOPY;  Surgeon: [name], MD;  [facility];  Service:  Endoscopy;  Laterality: N/A;  945  . Coronary artery bypass graft  1996    CABG x 4  . Ventral hernia repair    . Colonoscopy  04/20/2012    Procedure: COLONOSCOPY;  Surgeon: [name], MD;  [facility];  Service: Endoscopy;  Laterality: N/A;  1030  . Pr vein bypass graft,aorto-fem-pop  1996  . Spine surgery    . Joint replacement  1960    Right Knee  . Lymph node biopsy  08/15/2012    Procedure: LYMPH NODE BIOPSY;  Surgeon: Scherry Ran, MD;  Location: [facility];  Service: General;  Laterality: Left;  Cervical Lymph Node Bx in Minor Room  . Bone marrow biopsy    . Bone marrow aspiration      There were no vitals taken for this visit.  Visit Diagnosis:  Weakness of both legs  Poor balance      Subjective Assessment - 09/25/14 0811    Symptoms Feeling good  today, no reports of pain   Currently in Pain? No/denies          [facility] PT Assessment - 09/25/14 5638    Assessment   Medical Diagnosis Difficutly walking   Next MD Visit Blood work every MondayLuan Pulling unscheduled    Prior Therapy 2014 for back                   [facility] Adult PT Treatment/Exercise - 09/25/14 0001    Exercises   Exercises Knee/Hip;Lumbar;Balance   Lumbar Exercises: Standing   Functional Squats 20 reps   Other Standing Lumbar Exercises tandem stance 3x 30"; tandem gait 2RT, retro 2RT; hamstring curls Bil 2x 10 with 4#   Other Standing Lumbar Exercises SLS Ltt 13", Rt 21" max of 5   Knee/Hip Exercises: Standing   Knee Flexion Both;20 reps   Knee Flexion Limitations 4#   Gait Training SPC x 226 ft;cueing for posture and equalized stride length/ sequence                  PT Short Term Goals - 09/25/14 0919    PT SHORT TERM GOAL #1   Title Pt to be able to stand for 10 minutes without increased pain to be able to make a sandwich for a meal   Status Achieved   PT SHORT TERM GOAL #2   Title Pt to be able to walk for 15 minutes for better health habits   Status Achieved   PT SHORT TERM GOAL #3   Title Pt to walk with 20 degree forward flexion     Status Achieved           PT Long Term Goals - 09/25/14 0919    PT LONG TERM GOAL #1   Title Pt to be able to stand for 20 minutes to socialize   Status On-going   PT LONG TERM GOAL #2   Title Pt to be comfortable walking inside with a cane   Status On-going   PT LONG TERM GOAL #3   Title Patient to be able to walk with his walker for 30 minutes at a time   Status Achieved   PT LONG TERM GOAL #4   Title forward bent posture to be decreased to only 10 degree forward bend     Status On-going   PT LONG TERM GOAL #5   Title PT to be able to SLS x 10 seconds to reduce risk of falling  Status On-going               Plan - 09/25/14 0912    Clinical Impression Statement Pt continues  to stand with forward flexed trunk, constant cueing required for posture throughout session to improve posture to assist wtih balance and strengtheing exercises.  Gait training with SPC with appropriate sequence following cueing for posture and to increase stride length and heel to toe pattern to normalize gait mechanics.  Strengthening exercises focus on improvine hamstring and glut max strengtheining with cueing for form for maximum muscle contraction.  Pt limited by fatigue at end of session, no reports of pain.   PT Next Visit Plan focus on balance; strengthening shoud consists of hamstring and glut max only.  Gt train with cane to improve confidence level.         Problem List Patient Active Problem List   Diagnosis Date Noted  . Acute on chronic diastolic congestive heart failure   . Carotid stenosis   . Coronary artery disease involving coronary bypass graft of native heart with other forms of angina pectoris   . Aortic stenosis   . Acute pulmonary edema 09/10/2014  . Weakness of both legs 05/09/2014  . Poor balance 05/09/2014  . Difficulty in walking(719.7) 05/09/2014  . Chest pain 03/23/2014  . CHF (congestive heart failure) 02/12/2014  . Exertional dyspnea 02/12/2014  . Chest pain of uncertain etiology 35/52/1747  . Diabetes 01/01/2014  . Chronic kidney disease (CKD) stage G3b/A2, moderately decreased glomerular filtration rate (GFR) between 30-44 mL/min/1.73 square meter and albuminuria creatinine ratio between 30-299 mg/g 12/14/2013  . NSTEMI (non-ST elevated myocardial infarction) 11/28/2013  . HCAP (healthcare-associated pneumonia) 11/27/2013  . Community acquired pneumonia 11/27/2013  . Acute on chronic systolic heart failure 15/95/3967  . Other pancytopenia 01/08/2013  . Non-ST elevation MI (NSTEMI) 01/06/2013  . High output heart failure 10/25/2012  . Myelodysplastic syndrome with 5 q minus 10/17/2012  . History of colonic polyps 02/28/2012  . Anemia 01/10/2012  .  Angina effort 01/06/2012  . AVM (arteriovenous malformation) of colon without hemorrhage 01/04/2012  . GI bleeding 10/09/2011  . Long term current use of anticoagulant 12/22/2010  . CAROTID ARTERY STENOSIS 10/16/2010  . DYSLIPIDEMIA 04/11/2009  . Coronary atherosclerosis 04/11/2009  . Atrial fibrillation 04/11/2009  . Peripheral vascular disease 04/11/2009  . OSTEOARTHRITIS 04/11/2009   Ihor Austin, [place]  Aldona Lento 09/25/2014, 9:21 AM  [place], [place], [address] Phone: [phone]   Fax:  [phone]

## 2014-09-26 ENCOUNTER — Ambulatory Visit (HOSPITAL_COMMUNITY)
Admission: RE | Admit: 2014-09-26 | Discharge: 2014-09-26 | Disposition: A | Discharge status: Home / Self Care | Payer: Medicare Other | Source: Ambulatory Visit | Attending: Hematology and Oncology | Admitting: Hematology and Oncology

## 2014-09-26 DIAGNOSIS — Z5189 Encounter for other specified aftercare: Secondary | ICD-10-CM | POA: Diagnosis not present

## 2014-09-26 DIAGNOSIS — R2689 Other abnormalities of gait and mobility: Secondary | ICD-10-CM

## 2014-09-26 DIAGNOSIS — R29898 Other symptoms and signs involving the musculoskeletal system: Secondary | ICD-10-CM

## 2014-09-26 NOTE — Therapy (Signed)
[place], [place], [address] Phone: [phone]   Fax:  [phone]  Physical Therapy Treatment  Patient Details  Name: [name] MRN: [id number] Date of Birth: 1931/11/19  Encounter Date: 09/26/2014      PT End of Session - 09/26/14 1012    Visit Number 31   Number of Visits 37   Date for PT Re-Evaluation 10/18/14   Authorization Type medicare   Authorization - Visit Number 31   Authorization - Number of Visits 37   PT Start Time 0920   PT Stop Time 1015   PT Time Calculation (min) 55 min   Equipment Utilized During Treatment Gait belt   Activity Tolerance Patient tolerated treatment well      Past Medical History  Diagnosis Date  . Essential hypertension, benign   . COPD (chronic obstructive pulmonary disease)   . Coronary atherosclerosis of native coronary artery     Multivessel status post CABG 1996  . Arthritis   . Atrial fibrillation     Coumadin stopped 12/2011 due to anemia  . Dysphagia   . Anemia     Requiring blood transfusions  . Carotid stenosis     Dr Scot Dock   . Diabetes mellitus, type 2   . Mixed hyperlipidemia     Statin intolerant  . Hematuria     Felt related to Foley insertion  . Cardiomyopathy     LVEF 45-50%  . Melena     EGD & Colonoscopy 09/2011 revealed gastritis, erosions, scars, diverticula, 8 colon polyps (largest 1.6cm, not removed 2/2 anticoagulation), small AVM in transverse colon  . History of tobacco abuse     Quit 1992  . Anxiety   . GERD (gastroesophageal reflux disease)   . Myocardial infarction   . Thrombocytopenia   . Myelodysplastic syndrome with 5 q minus 10/17/2012    On Revlimid 5 mg daily 21 days on and 7 days off  . Aortic stenosis     Mild  . CKD (chronic kidney disease) stage 3, GFR 30-59 ml/min   . Pneumonia 11/2013    history of  . Blood transfusion without reported diagnosis     Past Surgical History  Procedure Laterality Date  .  Knee surgery  1960    Right knee cartilage  . Rotator cuff repair  1990    right   . Inguinal hernia repair  2000 and 2010    left  . Cholecystectomy  05/2010    Lap chole with biologic mesh repair/reinforcement by  Dr Rise Patience  . Back surgery  02/2006    ruptured disk,  post op diskitis infection requiring prolonged hospital stay and  surgical I & D  . Tonsillectomy    . Esophagogastroduodenoscopy  10/08/2011    Procedure: ESOPHAGOGASTRODUODENOSCOPY (EGD);  Surgeon: [name], MD;  [facility];  Service: Endoscopy;  Laterality: N/A;  . Colonoscopy  10/08/2011    Procedure: COLONOSCOPY;  Surgeon: [name], MD;  [facility];  Service: Endoscopy;  Laterality: N/A;  . Cataract extraction, bilateral    . Peg placement  07/2006    placed due to prolonged infection, poor po intake, malnutrition during several week hospital stay resulting from ruptured disc that became infected following surgery  . Colonoscopy  01/14/2012    Procedure: COLONOSCOPY;  Surgeon: [name], MD;  [facility]: [facility];  Service: Endoscopy;  Laterality: N/A;  945  . Coronary  artery bypass graft  1996    CABG x 4  . Ventral hernia repair    . Colonoscopy  04/20/2012    Procedure: COLONOSCOPY;  Surgeon: [name], MD;  [facility];  Service: Endoscopy;  Laterality: N/A;  1030  . Pr vein bypass graft,aorto-fem-pop  1996  . Spine surgery    . Joint replacement  1960    Right Knee  . Lymph node biopsy  08/15/2012    Procedure: LYMPH NODE BIOPSY;  Surgeon: Scherry Ran, MD;  Location: [facility];  Service: General;  Laterality: Left;  Cervical Lymph Node Bx in Minor Room  . Bone marrow biopsy    . Bone marrow aspiration      There were no vitals taken for this visit.  Visit Diagnosis:  Weakness of both legs  Poor balance      Subjective Assessment - 09/26/14 0916    Symptoms Pt states that he had his first fall this morning getting out of bed to pull  the cord on the alarm clock and he fell.     Currently in Pain? No/denies          [facility] PT Assessment - 09/25/14 0837    Assessment   Medical Diagnosis Difficutly walking   Next MD Visit Blood work every Monday; Luan Pulling unscheduled    Prior Therapy 2014 for back                   [facility] Adult PT Treatment/Exercise - 09/26/14 0001    Exercises   Exercises Lumbar;Knee/Hip;Balance   Lumbar Exercises: Aerobic   Stationary Bike nustep hills L4 x 10:00   Lumbar Exercises: Seated   Other Seated Lumbar Exercises sit to stand 10x each leg    Knee/Hip Exercises: Standing   Knee Flexion Right;15 reps   Knee Flexion Limitations 4#   Balance Exercises   March on Foam/Wedge 10 reps   March on Foam/Wedge Limitations no foam    SLS Eyes open;5 reps   Tandem Stance Eyes open;3 reps;30 secs   Gait with Head Turns (Round Trips) stand with head turns    Wall Bumps 10 reps   Wall Bumps Limitations hips as well   Toe Raise 15 reps                  PT Short Term Goals - 09/25/14 0919    PT SHORT TERM GOAL #1   Title Pt to be able to stand for 10 minutes without increased pain to be able to make a sandwich for a meal   Status Achieved   PT SHORT TERM GOAL #2   Title Pt to be able to walk for 15 minutes for better health habits   Status Achieved   PT SHORT TERM GOAL #3   Title Pt to walk with 20 degree forward flexion     Status Achieved           PT Long Term Goals - 09/25/14 0919    PT LONG TERM GOAL #1   Title Pt to be able to stand for 20 minutes to socialize   Status On-going   PT LONG TERM GOAL #2   Title Pt to be comfortable walking inside with a cane   Status On-going   PT LONG TERM GOAL #3   Title Patient to be able to walk with his walker for 30 minutes at a time   Status Achieved   PT LONG TERM GOAL #  4   Title forward bent posture to be decreased to only 10 degree forward bend     Status On-going   PT LONG TERM GOAL #5   Title PT to be able to  SLS x 10 seconds to reduce risk of falling   Status On-going               Plan - 09/26/14 1013    Clinical Impression Statement Pt continues to have significant balance deficit needing therapist facilitation to maintain balance throughout treatment.  Pt ambulates with a flat foot gait and in a forward bent postition.     PT Next Visit Plan focus on balance; strengthening shoud consists of hamstring and glut max only.  Gt train with cane to improve confidence level.   Pt has already been reevaluated witll  reevaluate prior to 10/17/2014   PT Plan work on gait mechanics by having pt at //; go into heelsrike and hold x 30 seconds go into heellift and hold for 30 seconds repeat. Continue with balance activity         Problem List Patient Active Problem List   Diagnosis Date Noted  . Acute on chronic diastolic congestive heart failure   . Carotid stenosis   . Coronary artery disease involving coronary bypass graft of native heart with other forms of angina pectoris   . Aortic stenosis   . Acute pulmonary edema 09/10/2014  . Weakness of both legs 05/09/2014  . Poor balance 05/09/2014  . Difficulty in walking(719.7) 05/09/2014  . Chest pain 03/23/2014  . CHF (congestive heart failure) 02/12/2014  . Exertional dyspnea 02/12/2014  . Chest pain of uncertain etiology 44/31/5400  . Diabetes 01/01/2014  . Chronic kidney disease (CKD) stage G3b/A2, moderately decreased glomerular filtration rate (GFR) between 30-44 mL/min/1.73 square meter and albuminuria creatinine ratio between 30-299 mg/g 12/14/2013  . NSTEMI (non-ST elevated myocardial infarction) 11/28/2013  . HCAP (healthcare-associated pneumonia) 11/27/2013  . Community acquired pneumonia 11/27/2013  . Acute on chronic systolic heart failure 86/76/1950  . Other pancytopenia 01/08/2013  . Non-ST elevation MI (NSTEMI) 01/06/2013  . High output heart failure 10/25/2012  . Myelodysplastic syndrome with 5 q minus 10/17/2012  .  History of colonic polyps 02/28/2012  . Anemia 01/10/2012  . Angina effort 01/06/2012  . AVM (arteriovenous malformation) of colon without hemorrhage 01/04/2012  . GI bleeding 10/09/2011  . Long term current use of anticoagulant 12/22/2010  . CAROTID ARTERY STENOSIS 10/16/2010  . DYSLIPIDEMIA 04/11/2009  . Coronary atherosclerosis 04/11/2009  . Atrial fibrillation 04/11/2009  . Peripheral vascular disease 04/11/2009  . OSTEOARTHRITIS 04/11/2009    Gene Colee,CINDY PT 09/26/2014, 10:18 AM  [place], [place], [address] Phone: [phone]   Fax:  [phone]

## 2014-09-30 ENCOUNTER — Encounter (HOSPITAL_COMMUNITY): Payer: Medicare Other | Attending: Hematology

## 2014-09-30 ENCOUNTER — Other Ambulatory Visit (HOSPITAL_COMMUNITY): Payer: Self-pay | Admitting: Oncology

## 2014-09-30 ENCOUNTER — Ambulatory Visit (HOSPITAL_COMMUNITY)
Admission: RE | Admit: 2014-09-30 | Payer: Medicare Other | Source: Ambulatory Visit | Attending: Hematology and Oncology | Admitting: Hematology and Oncology

## 2014-09-30 DIAGNOSIS — D46C Myelodysplastic syndrome with isolated del(5q) chromosomal abnormality: Secondary | ICD-10-CM

## 2014-09-30 LAB — CBC WITH DIFFERENTIAL/PLATELET
BASOS PCT: 2 % — AB (ref 0–1)
Basophils Absolute: 0.1 10*3/uL (ref 0.0–0.1)
EOS ABS: 0.6 10*3/uL (ref 0.0–0.7)
EOS PCT: 14 % — AB (ref 0–5)
HCT: 24.9 % — ABNORMAL LOW (ref 39.0–52.0)
Hemoglobin: 8.5 g/dL — ABNORMAL LOW (ref 13.0–17.0)
LYMPHS PCT: 28 % (ref 12–46)
Lymphs Abs: 1.2 10*3/uL (ref 0.7–4.0)
MCH: 32.1 pg (ref 26.0–34.0)
MCHC: 34.1 g/dL (ref 30.0–36.0)
MCV: 94 fL (ref 78.0–100.0)
MONOS PCT: 6 % (ref 3–12)
Monocytes Absolute: 0.3 10*3/uL (ref 0.1–1.0)
Neutro Abs: 2.2 10*3/uL (ref 1.7–7.7)
Neutrophils Relative %: 51 % (ref 43–77)
Platelets: 100 10*3/uL — ABNORMAL LOW (ref 150–400)
RBC: 2.65 MIL/uL — ABNORMAL LOW (ref 4.22–5.81)
RDW: 18.4 % — ABNORMAL HIGH (ref 11.5–15.5)
WBC: 4.4 10*3/uL (ref 4.0–10.5)

## 2014-09-30 LAB — PREPARE RBC (CROSSMATCH)

## 2014-09-30 NOTE — Progress Notes (Signed)
LABS FOR CBCD 

## 2014-10-01 ENCOUNTER — Encounter (HOSPITAL_COMMUNITY): Payer: Self-pay

## 2014-10-01 ENCOUNTER — Encounter: Payer: Self-pay | Admitting: *Deleted

## 2014-10-01 ENCOUNTER — Encounter (HOSPITAL_COMMUNITY): Payer: Medicare Other | Attending: Oncology

## 2014-10-01 DIAGNOSIS — D46C Myelodysplastic syndrome with isolated del(5q) chromosomal abnormality: Secondary | ICD-10-CM | POA: Diagnosis not present

## 2014-10-01 MED ORDER — FUROSEMIDE 10 MG/ML IJ SOLN
20.0000 mg | Freq: Once | INTRAMUSCULAR | Status: AC
  Administered 2014-10-01: 20 mg via INTRAVENOUS
  Filled 2014-10-01: qty 2

## 2014-10-01 MED ORDER — SODIUM CHLORIDE 0.9 % IV SOLN
250.0000 mL | Freq: Once | INTRAVENOUS | Status: AC
  Administered 2014-10-01: 250 mL via INTRAVENOUS

## 2014-10-01 MED ORDER — DIPHENHYDRAMINE HCL 50 MG/ML IJ SOLN
25.0000 mg | Freq: Once | INTRAMUSCULAR | Status: AC
  Administered 2014-10-01: 25 mg via INTRAVENOUS
  Filled 2014-10-01: qty 1

## 2014-10-01 MED ORDER — SODIUM CHLORIDE 0.9 % IJ SOLN: 10.0000 mL | INTRAMUSCULAR | Status: DC | PRN

## 2014-10-01 MED ORDER — ACETAMINOPHEN 325 MG PO TABS
650.0000 mg | ORAL_TABLET | Freq: Once | ORAL | Status: AC
  Administered 2014-10-01: 650 mg via ORAL
  Filled 2014-10-01: qty 2

## 2014-10-01 NOTE — Patient Instructions (Signed)
[facility] Discharge Instructions  RECOMMENDATIONS MADE BY THE CONSULTANT AND ANY TEST RESULTS WILL BE SENT TO YOUR REFERRING PHYSICIAN.  You received 2 units of blood today.  Please follow up as scheduled.  Please call the clinic if you have any questions or concerns  Thank you for choosing [facility] to provide your oncology and hematology care.  To afford each patient quality time with our providers, please arrive at least 15 minutes before your scheduled appointment time.  With your help, our goal is to use those 15 minutes to complete the necessary work-up to ensure our physicians have the information they need to help with your evaluation and healthcare recommendations.    Effective January 1st, 2014, we ask that you re-schedule your appointment with our physicians should you arrive 10 or more minutes late for your appointment.  We strive to give you quality time with our providers, and arriving late affects you and other patients whose appointments are after yours.    Again, thank you for choosing [facility].  Our hope is that these requests will decrease the amount of time that you wait before being seen by our physicians.       _____________________________________________________________  Should you have questions after your visit to [facility], please contact our office at [phone] between the hours of 8:30 a.m. and 5:00 p.m.  Voicemails left after 4:30 p.m. will not be returned until the following business day.  For prescription refill requests, have your pharmacy contact our office with your prescription refill request.

## 2014-10-01 NOTE — Progress Notes (Signed)
[facility] Clinical Social Work  Clinical Social Work was referred by [facility] rounding for assessment of psychosocial needs due to previous grief support.  Clinical Social Worker met with patient at [facility] to offer support and assess for needs.  Pt reports he is doing well currently and "all adjusted". We discussed how grief is a process and that there can be good days and bad days, ups and downs. Pt feels things are going well currently. He agrees to reach out to CSW as needed.   Clinical Social Work interventions: Grief support   Loren Racer, [place] Tuesdays 8:30-1pm Wednesdays 8:30-12pm  [phone]

## 2014-10-01 NOTE — Progress Notes (Signed)
[name] Tolerated 2 units of blood today

## 2014-10-02 ENCOUNTER — Ambulatory Visit (HOSPITAL_COMMUNITY)
Admission: RE | Admit: 2014-10-02 | Discharge: 2014-10-02 | Disposition: A | Discharge status: Home / Self Care | Payer: Medicare Other | Source: Ambulatory Visit | Attending: Oncology | Admitting: Oncology

## 2014-10-02 ENCOUNTER — Encounter (HOSPITAL_COMMUNITY): Payer: Medicare Other

## 2014-10-02 DIAGNOSIS — J449 Chronic obstructive pulmonary disease, unspecified: Secondary | ICD-10-CM | POA: Diagnosis not present

## 2014-10-02 DIAGNOSIS — Z9181 History of falling: Secondary | ICD-10-CM | POA: Insufficient documentation

## 2014-10-02 DIAGNOSIS — R29898 Other symptoms and signs involving the musculoskeletal system: Secondary | ICD-10-CM

## 2014-10-02 DIAGNOSIS — Z5189 Encounter for other specified aftercare: Secondary | ICD-10-CM | POA: Insufficient documentation

## 2014-10-02 DIAGNOSIS — I10 Essential (primary) hypertension: Secondary | ICD-10-CM | POA: Diagnosis not present

## 2014-10-02 DIAGNOSIS — Z951 Presence of aortocoronary bypass graft: Secondary | ICD-10-CM | POA: Insufficient documentation

## 2014-10-02 DIAGNOSIS — R262 Difficulty in walking, not elsewhere classified: Secondary | ICD-10-CM | POA: Diagnosis not present

## 2014-10-02 DIAGNOSIS — R2689 Other abnormalities of gait and mobility: Secondary | ICD-10-CM

## 2014-10-02 LAB — TYPE AND SCREEN
ABO/RH(D): A NEG
Antibody Screen: NEGATIVE
UNIT DIVISION: 0
Unit division: 0

## 2014-10-02 NOTE — Therapy (Signed)
[place], [place], [address] Phone: [phone]   Fax:  [phone]  Physical Therapy Treatment  Patient Details  Name: [name] MRN: [id number] Date of Birth: [birth year]  Encounter Date: 10/02/2014      PT End of Session - 10/02/14 0930    Visit Number 32   Number of Visits 37   Date for PT Re-Evaluation 10/18/14   Authorization Type medicare   Authorization - Visit Number 36   Authorization - Number of Visits 37   PT Start Time 0805   PT Stop Time 0852   PT Time Calculation (min) 47 min   Equipment Utilized During Treatment Gait belt   Activity Tolerance Patient tolerated treatment well   Behavior During Therapy [facility] for tasks assessed/performed      Past Medical History  Diagnosis Date  . Essential hypertension, benign   . COPD (chronic obstructive pulmonary disease)   . Coronary atherosclerosis of native coronary artery     Multivessel status post CABG 1996  . Arthritis   . Atrial fibrillation     Coumadin stopped 12/2011 due to anemia  . Dysphagia   . Anemia     Requiring blood transfusions  . Carotid stenosis     Dr Scot Dock   . Diabetes mellitus, type 2   . Mixed hyperlipidemia     Statin intolerant  . Hematuria     Felt related to Foley insertion  . Cardiomyopathy     LVEF 45-50%  . Melena     EGD & Colonoscopy 09/2011 revealed gastritis, erosions, scars, diverticula, 8 colon polyps (largest 1.6cm, not removed 2/2 anticoagulation), small AVM in transverse colon  . History of tobacco abuse     Quit 1992  . Anxiety   . GERD (gastroesophageal reflux disease)   . Myocardial infarction   . Thrombocytopenia   . Myelodysplastic syndrome with 5 q minus 10/17/2012    On Revlimid 5 mg daily 21 days on and 7 days off  . Aortic stenosis     Mild  . CKD (chronic kidney disease) stage 3, GFR 30-59 ml/min   . Pneumonia 11/2013    history of  . Blood transfusion without reported diagnosis      Past Surgical History  Procedure Laterality Date  . Knee surgery  1960    Right knee cartilage  . Rotator cuff repair  1990    right   . Inguinal hernia repair  2000 and 2010    left  . Cholecystectomy  05/2010    Lap chole with biologic mesh repair/reinforcement by  Dr Rise Patience  . Back surgery  02/2006    ruptured disk,  post op diskitis infection requiring prolonged hospital stay and  surgical I & D  . Tonsillectomy    . Esophagogastroduodenoscopy  10/08/2011    Procedure: ESOPHAGOGASTRODUODENOSCOPY (EGD);  Surgeon: [name], MD;  [facility];  Service: Endoscopy;  Laterality: N/A;  . Colonoscopy  10/08/2011    Procedure: COLONOSCOPY;  Surgeon: [name], MD;  [facility];  Service: Endoscopy;  Laterality: N/A;  . Cataract extraction, bilateral    . Peg placement  07/2006    placed due to prolonged infection, poor po intake, malnutrition during several week hospital stay resulting from ruptured disc that became infected following surgery  . Colonoscopy  01/14/2012    Procedure: COLONOSCOPY;  Surgeon: [name], MD;  [facility];  Service:  Endoscopy;  Laterality: N/A;  945  . Coronary artery bypass graft  1996    CABG x 4  . Ventral hernia repair    . Colonoscopy  04/20/2012    Procedure: COLONOSCOPY;  Surgeon: [name], MD;  [facility];  Service: Endoscopy;  Laterality: N/A;  1030  . Pr vein bypass graft,aorto-fem-pop  1996  . Spine surgery    . Joint replacement  1960    Right Knee  . Lymph node biopsy  08/15/2012    Procedure: LYMPH NODE BIOPSY;  Surgeon: Scherry Ran, MD;  Location: [facility];  Service: General;  Laterality: Left;  Cervical Lymph Node Bx in Minor Room  . Bone marrow biopsy    . Bone marrow aspiration      There were no vitals taken for this visit.  Visit Diagnosis:  Weakness of both legs  Poor balance      Subjective Assessment - 10/02/14 0823    Symptoms Pt states that  he became very SOB and was put into the hospital.  They gave him some medication that makes him sleep throughout the whole night, (slept through Mondays appointment).   Currently in Pain? No/denies                    [facility] Adult PT Treatment/Exercise - 10/02/14 0001    Lumbar Exercises: Aerobic   Stationary Bike L5 x 10:00   Lumbar Exercises: Standing   Other Standing Lumbar Exercises tandem stance 3x 30"; tandem gait 2RT, retro 2RT; hamstring curls Bil 2x 10 with 4#   Other Standing Lumbar Exercises SLS Ltt 13", Rt 21" max of 5   Lumbar Exercises: Seated   Other Seated Lumbar Exercises sit to stand 10x each leg    Knee/Hip Exercises: Standing   Heel Raises 10 reps   Heel Raises Limitations combined with functinal squatss   Functional Squat 10 reps   Other Standing Knee Exercises gt train in // heelstrike hold 10, toe off 10   Balance Exercises: Standing   Wall Bumps Limitations hips as well   Wall Bumps 10 reps   Additional Knee Exercises DO NOT USE   Stairs 2 RT   Balance Exercises   March on Foam/Wedge 10 reps   March on Foam/Wedge Limitations no foam    Kickball --  standing at wall with B UE flexion    Ankle Exercises: Standing   Toe Raise 15 reps                  PT Short Term Goals - 09/25/14 0919    PT SHORT TERM GOAL #1   Title Pt to be able to stand for 10 minutes without increased pain to be able to make a sandwich for a meal   Status Achieved   PT SHORT TERM GOAL #2   Title Pt to be able to walk for 15 minutes for better health habits   Status Achieved   PT SHORT TERM GOAL #3   Title Pt to walk with 20 degree forward flexion     Status Achieved           PT Long Term Goals - 09/25/14 0919    PT LONG TERM GOAL #1   Title Pt to be able to stand for 20 minutes to socialize   Status On-going   PT LONG TERM GOAL #2   Title Pt to be comfortable walking inside with a cane   Status  On-going   PT LONG TERM GOAL #3   Title Patient to be  able to walk with his walker for 30 minutes at a time   Status Achieved   PT LONG TERM GOAL #4   Title forward bent posture to be decreased to only 10 degree forward bend     Status On-going   PT LONG TERM GOAL #5   Title PT to be able to SLS x 10 seconds to reduce risk of falling   Status On-going               Plan - 10/02/14 0930    Clinical Impression Statement Added steps with difficulty on ascending 8" step reciprocally;  Added gt training to normalize gt as pt tends to ambulate flat footed pt was able to demonstrate heel toe gait pattern after instruction and was instructed to work on this at home but will need continued practice in department.    PT Next Visit Plan Return gait training with cane into program to assess safety of ambulation with cane.  Pt desires to return to the cane but this may not be feasible as it is more important to stay safe.         Problem List Patient Active Problem List   Diagnosis Date Noted  . Acute on chronic diastolic congestive heart failure   . Carotid stenosis   . Coronary artery disease involving coronary bypass graft of native heart with other forms of angina pectoris   . Aortic stenosis   . Acute pulmonary edema 09/10/2014  . Weakness of both legs 05/09/2014  . Poor balance 05/09/2014  . Difficulty in walking(719.7) 05/09/2014  . Chest pain 03/23/2014  . CHF (congestive heart failure) 02/12/2014  . Exertional dyspnea 02/12/2014  . Chest pain of uncertain etiology 62/95/2841  . Diabetes 01/01/2014  . Chronic kidney disease (CKD) stage G3b/A2, moderately decreased glomerular filtration rate (GFR) between 30-44 mL/min/1.73 square meter and albuminuria creatinine ratio between 30-299 mg/g 12/14/2013  . NSTEMI (non-ST elevated myocardial infarction) 11/28/2013  . HCAP (healthcare-associated pneumonia) 11/27/2013  . Community acquired pneumonia 11/27/2013  . Acute on chronic systolic heart failure 32/44/0102  . Other pancytopenia  01/08/2013  . Non-ST elevation MI (NSTEMI) 01/06/2013  . High output heart failure 10/25/2012  . Myelodysplastic syndrome with 5 q minus 10/17/2012  . History of colonic polyps 02/28/2012  . Anemia 01/10/2012  . Angina effort 01/06/2012  . AVM (arteriovenous malformation) of colon without hemorrhage 01/04/2012  . GI bleeding 10/09/2011  . Long term current use of anticoagulant 12/22/2010  . CAROTID ARTERY STENOSIS 10/16/2010  . DYSLIPIDEMIA 04/11/2009  . Coronary atherosclerosis 04/11/2009  . Atrial fibrillation 04/11/2009  . Peripheral vascular disease 04/11/2009  . OSTEOARTHRITIS 04/11/2009    RUSSELL,CINDY PT  10/02/2014, 9:34 AM  [facility] [address] [place], [place], [address] Phone: [phone]   Fax:  [phone]

## 2014-10-07 ENCOUNTER — Ambulatory Visit (HOSPITAL_COMMUNITY)
Admission: RE | Admit: 2014-10-07 | Discharge: 2014-10-07 | Disposition: A | Discharge status: Home / Self Care | Payer: Medicare Other | Source: Ambulatory Visit | Attending: Hematology and Oncology | Admitting: Hematology and Oncology

## 2014-10-07 ENCOUNTER — Encounter (HOSPITAL_BASED_OUTPATIENT_CLINIC_OR_DEPARTMENT_OTHER): Payer: Medicare Other

## 2014-10-07 DIAGNOSIS — R2689 Other abnormalities of gait and mobility: Secondary | ICD-10-CM

## 2014-10-07 DIAGNOSIS — Z5189 Encounter for other specified aftercare: Secondary | ICD-10-CM | POA: Diagnosis not present

## 2014-10-07 DIAGNOSIS — D46C Myelodysplastic syndrome with isolated del(5q) chromosomal abnormality: Secondary | ICD-10-CM | POA: Diagnosis not present

## 2014-10-07 DIAGNOSIS — R29898 Other symptoms and signs involving the musculoskeletal system: Secondary | ICD-10-CM

## 2014-10-07 LAB — CBC WITH DIFFERENTIAL/PLATELET
BASOS ABS: 0.1 10*3/uL (ref 0.0–0.1)
BASOS PCT: 2 % — AB (ref 0–1)
EOS PCT: 10 % — AB (ref 0–5)
Eosinophils Absolute: 0.3 10*3/uL (ref 0.0–0.7)
HCT: 27.8 % — ABNORMAL LOW (ref 39.0–52.0)
Hemoglobin: 9.2 g/dL — ABNORMAL LOW (ref 13.0–17.0)
LYMPHS ABS: 1 10*3/uL (ref 0.7–4.0)
LYMPHS PCT: 32 % (ref 12–46)
MCH: 31.3 pg (ref 26.0–34.0)
MCHC: 33.1 g/dL (ref 30.0–36.0)
MCV: 94.6 fL (ref 78.0–100.0)
Monocytes Absolute: 0.2 10*3/uL (ref 0.1–1.0)
Monocytes Relative: 6 % (ref 3–12)
NEUTROS ABS: 1.6 10*3/uL — AB (ref 1.7–7.7)
Neutrophils Relative %: 51 % (ref 43–77)
Platelets: 66 10*3/uL — ABNORMAL LOW (ref 150–400)
RBC: 2.94 MIL/uL — ABNORMAL LOW (ref 4.22–5.81)
RDW: 17.6 % — ABNORMAL HIGH (ref 11.5–15.5)
WBC: 3.2 10*3/uL — ABNORMAL LOW (ref 4.0–10.5)

## 2014-10-07 NOTE — Progress Notes (Signed)
LABS FOR CBCD 

## 2014-10-07 NOTE — Therapy (Signed)
[name], [place], [address] Phone: [phone]   Fax:  [phone]  Physical Therapy Treatment  Patient Details  Name: [name] MRN: [id number] Date of Birth: Jan 29, 1932 Referring Provider:  [name], MD  Encounter Date: 10/07/2014      PT End of Session - 10/07/14 0940    Visit Number 33   Number of Visits 37   Date for PT Re-Evaluation 10/18/14   Authorization Type medicare   Authorization - Visit Number 57   Authorization - Number of Visits 37   PT Start Time 0850   PT Stop Time 0940   PT Time Calculation (min) 50 min   Equipment Utilized During Treatment Gait belt   Activity Tolerance Patient tolerated treatment well   Behavior During Therapy [facility] for tasks assessed/performed      Past Medical History  Diagnosis Date  . Essential hypertension, benign   . COPD (chronic obstructive pulmonary disease)   . Coronary atherosclerosis of native coronary artery     Multivessel status post CABG 1996  . Arthritis   . Atrial fibrillation     Coumadin stopped 12/2011 due to anemia  . Dysphagia   . Anemia     Requiring blood transfusions  . Carotid stenosis     Dr Scot Dock   . Diabetes mellitus, type 2   . Mixed hyperlipidemia     Statin intolerant  . Hematuria     Felt related to Foley insertion  . Cardiomyopathy     LVEF 45-50%  . Melena     EGD & Colonoscopy 09/2011 revealed gastritis, erosions, scars, diverticula, 8 colon polyps (largest 1.6cm, not removed 2/2 anticoagulation), small AVM in transverse colon  . History of tobacco abuse     Quit 1992  . Anxiety   . GERD (gastroesophageal reflux disease)   . Myocardial infarction   . Thrombocytopenia   . Myelodysplastic syndrome with 5 q minus 10/17/2012    On Revlimid 5 mg daily 21 days on and 7 days off  . Aortic stenosis     Mild  . CKD (chronic kidney disease) stage 3, GFR 30-59 ml/min   . Pneumonia 11/2013    history of  .  Blood transfusion without reported diagnosis     Past Surgical History  Procedure Laterality Date  . Knee surgery  1960    Right knee cartilage  . Rotator cuff repair  1990    right   . Inguinal hernia repair  2000 and 2010    left  . Cholecystectomy  05/2010    Lap chole with biologic mesh repair/reinforcement by  Dr Rise Patience  . Back surgery  02/2006    ruptured disk,  post op diskitis infection requiring prolonged hospital stay and  surgical I & D  . Tonsillectomy    . Esophagogastroduodenoscopy  10/08/2011    Procedure: ESOPHAGOGASTRODUODENOSCOPY (EGD);  Surgeon: [name], MD;  [facility];  Service: Endoscopy;  Laterality: N/A;  . Colonoscopy  10/08/2011    Procedure: COLONOSCOPY;  Surgeon: [name], MD;  [facility];  Service: Endoscopy;  Laterality: N/A;  . Cataract extraction, bilateral    . Peg placement  07/2006    placed due to prolonged infection, poor po intake, malnutrition during several week hospital stay resulting from ruptured disc that became infected following surgery  . Colonoscopy  01/14/2012    Procedure: COLONOSCOPY;  Surgeon: [name], MD;  [facility];  Service: Endoscopy;  Laterality: N/A;  945  . Coronary artery bypass graft  1996    CABG x 4  . Ventral hernia repair    . Colonoscopy  04/20/2012    Procedure: COLONOSCOPY;  Surgeon: [name], MD;  [facility];  Service: Endoscopy;  Laterality: N/A;  1030  . Pr vein bypass graft,aorto-fem-pop  1996  . Spine surgery    . Joint replacement  1960    Right Knee  . Lymph node biopsy  08/15/2012    Procedure: LYMPH NODE BIOPSY;  Surgeon: Scherry Ran, MD;  Location: [facility];  Service: General;  Laterality: Left;  Cervical Lymph Node Bx in Minor Room  . Bone marrow biopsy    . Bone marrow aspiration      There were no vitals taken for this visit.  Visit Diagnosis:  Weakness of both legs  Poor balance      Subjective Assessment  - 10/07/14 0904    Symptoms Pt states he woke at 11:00 last night thinking it was morning and got up to get ready.  States his balance is still off.  Reports no pain.   Currently in Pain? No/denies   Pain Score 0-No pain                    [facility] Adult PT Treatment/Exercise - 10/07/14 0905    Lumbar Exercises: Stretches   Passive Hamstring Stretch 3 reps;30 seconds   Passive Hamstring Stretch Limitations Gastroc Stretch, Slantboard   Lumbar Exercises: Aerobic   Stationary Bike NuStep 10' Level 3 hills #3   Lumbar Exercises: Standing   Functional Squats 20 reps   Other Standing Lumbar Exercises tandem gait 2RT, retro 2RT   Lumbar Exercises: Seated   Hip Flexion on Ball Limitations --   Sit to Stand 10 reps   Sit to Stand Limitations Rt/Lt back each 10 reps   Knee/Hip Exercises: Standing   Stairs reciprocally 7" steps 2RT wth SPC   SLS Lt 8", Rt 6" max of 3   Gait Training with SPC X 500 feet with CGA                  PT Short Term Goals - 09/25/14 0919    PT SHORT TERM GOAL #1   Title Pt to be able to stand for 10 minutes without increased pain to be able to make a sandwich for a meal   Status Achieved   PT SHORT TERM GOAL #2   Title Pt to be able to walk for 15 minutes for better health habits   Status Achieved   PT SHORT TERM GOAL #3   Title Pt to walk with 20 degree forward flexion     Status Achieved           PT Long Term Goals - 09/25/14 0919    PT LONG TERM GOAL #1   Title Pt to be able to stand for 20 minutes to socialize   Status On-going   PT LONG TERM GOAL #2   Title Pt to be comfortable walking inside with a cane   Status On-going   PT LONG TERM GOAL #3   Title Patient to be able to walk with his walker for 30 minutes at a time   Status Achieved   PT LONG TERM GOAL #4   Title forward bent posture to be decreased to only 10 degree forward bend  Status On-going   PT LONG TERM GOAL #5   Title PT to be able to SLS x 10 seconds to  reduce risk of falling   Status On-going               Plan - 10/07/14 0941    Clinical Impression Statement Pt with decreased ability to complete sustained SLS but may be due to completing towards end of session.  Pt able to complete 500 feet ambuation using SPC before needing rest break.  Cues needed for posture and heel-toe gait to decrease shuffling.   Pt with most difficulty with decreased stablity completing retro ambulation with tendency to remain forward flexed.     PT Next Visit Plan Continue to increase stability with goal of return to gait training with cane.  Progress balance activities.          Problem List Patient Active Problem List   Diagnosis Date Noted  . Acute on chronic diastolic congestive heart failure   . Carotid stenosis   . Coronary artery disease involving coronary bypass graft of native heart with other forms of angina pectoris   . Aortic stenosis   . Acute pulmonary edema 09/10/2014  . Weakness of both legs 05/09/2014  . Poor balance 05/09/2014  . Difficulty in walking(719.7) 05/09/2014  . Chest pain 03/23/2014  . CHF (congestive heart failure) 02/12/2014  . Exertional dyspnea 02/12/2014  . Chest pain of uncertain etiology 20/81/3887  . Diabetes 01/01/2014  . Chronic kidney disease (CKD) stage G3b/A2, moderately decreased glomerular filtration rate (GFR) between 30-44 mL/min/1.73 square meter and albuminuria creatinine ratio between 30-299 mg/g 12/14/2013  . NSTEMI (non-ST elevated myocardial infarction) 11/28/2013  . HCAP (healthcare-associated pneumonia) 11/27/2013  . Community acquired pneumonia 11/27/2013  . Acute on chronic systolic heart failure 19/59/7471  . Other pancytopenia 01/08/2013  . Non-ST elevation MI (NSTEMI) 01/06/2013  . High output heart failure 10/25/2012  . Myelodysplastic syndrome with 5 q minus 10/17/2012  . History of colonic polyps 02/28/2012  . Anemia 01/10/2012  . Angina effort 01/06/2012  . AVM (arteriovenous  malformation) of colon without hemorrhage 01/04/2012  . GI bleeding 10/09/2011  . Long term current use of anticoagulant 12/22/2010  . CAROTID ARTERY STENOSIS 10/16/2010  . DYSLIPIDEMIA 04/11/2009  . Coronary atherosclerosis 04/11/2009  . Atrial fibrillation 04/11/2009  . Peripheral vascular disease 04/11/2009  . OSTEOARTHRITIS 04/11/2009    Teena Irani, PTA/CLT [phone] 10/07/2014, 9:53 AM  [place], [place], [address] Phone: [phone]   Fax:  [phone]

## 2014-10-09 ENCOUNTER — Ambulatory Visit (HOSPITAL_COMMUNITY)
Admission: RE | Admit: 2014-10-09 | Discharge: 2014-10-09 | Disposition: A | Discharge status: Home / Self Care | Payer: Medicare Other | Source: Ambulatory Visit | Attending: Hematology and Oncology | Admitting: Hematology and Oncology

## 2014-10-09 DIAGNOSIS — R29898 Other symptoms and signs involving the musculoskeletal system: Secondary | ICD-10-CM

## 2014-10-09 DIAGNOSIS — R2689 Other abnormalities of gait and mobility: Secondary | ICD-10-CM

## 2014-10-09 DIAGNOSIS — Z5189 Encounter for other specified aftercare: Secondary | ICD-10-CM | POA: Diagnosis not present

## 2014-10-09 NOTE — Therapy (Signed)
[place], [place], [address] Phone: [phone]   Fax:  [phone]  Physical Therapy Treatment  Patient Details  Name: [name] MRN: [id number] Date of Birth: [birth year] Referring Provider:  Baird Cancer, PA-C  Encounter Date: 10/09/2014      PT End of Session - 10/09/14 1839    Visit Number 34   Number of Visits 37   Date for PT Re-Evaluation 10/18/14   Authorization Type medicare   Authorization - Visit Number 34   Authorization - Number of Visits 37   PT Start Time 0076   PT Stop Time 1735   PT Time Calculation (min) 45 min   Equipment Utilized During Treatment Gait belt   Activity Tolerance Patient tolerated treatment well   Behavior During Therapy [facility] for tasks assessed/performed      Past Medical History  Diagnosis Date  . Essential hypertension, benign   . COPD (chronic obstructive pulmonary disease)   . Coronary atherosclerosis of native coronary artery     Multivessel status post CABG 1996  . Arthritis   . Atrial fibrillation     Coumadin stopped 12/2011 due to anemia  . Dysphagia   . Anemia     Requiring blood transfusions  . Carotid stenosis     Dr Scot Dock   . Diabetes mellitus, type 2   . Mixed hyperlipidemia     Statin intolerant  . Hematuria     Felt related to Foley insertion  . Cardiomyopathy     LVEF 45-50%  . Melena     EGD & Colonoscopy 09/2011 revealed gastritis, erosions, scars, diverticula, 8 colon polyps (largest 1.6cm, not removed 2/2 anticoagulation), small AVM in transverse colon  . History of tobacco abuse     Quit 1992  . Anxiety   . GERD (gastroesophageal reflux disease)   . Myocardial infarction   . Thrombocytopenia   . Myelodysplastic syndrome with 5 q minus 10/17/2012    On Revlimid 5 mg daily 21 days on and 7 days off  . Aortic stenosis     Mild  . CKD (chronic kidney disease) stage 3, GFR 30-59 ml/min   . Pneumonia 11/2013    history of  .  Blood transfusion without reported diagnosis     Past Surgical History  Procedure Laterality Date  . Knee surgery  1960    Right knee cartilage  . Rotator cuff repair  1990    right   . Inguinal hernia repair  2000 and 2010    left  . Cholecystectomy  05/2010    Lap chole with biologic mesh repair/reinforcement by  Dr Rise Patience  . Back surgery  02/2006    ruptured disk,  post op diskitis infection requiring prolonged hospital stay and  surgical I & D  . Tonsillectomy    . Esophagogastroduodenoscopy  10/08/2011    Procedure: ESOPHAGOGASTRODUODENOSCOPY (EGD);  Surgeon: [name], MD;  [facility];  Service: Endoscopy;  Laterality: N/A;  . Colonoscopy  10/08/2011    Procedure: COLONOSCOPY;  Surgeon: [name], MD;  [facility];  Service: Endoscopy;  Laterality: N/A;  . Cataract extraction, bilateral    . Peg placement  07/2006    placed due to prolonged infection, poor po intake, malnutrition during several week hospital stay resulting from ruptured disc that became infected following surgery  . Colonoscopy  01/14/2012    Procedure: COLONOSCOPY;  Surgeon: [name], MD;  [facility];  Service: Endoscopy;  Laterality: N/A;  945  . Coronary artery bypass graft  1996    CABG x 4  . Ventral hernia repair    . Colonoscopy  04/20/2012    Procedure: COLONOSCOPY;  Surgeon: [name], MD;  [facility];  Service: Endoscopy;  Laterality: N/A;  1030  . Pr vein bypass graft,aorto-fem-pop  1996  . Spine surgery    . Joint replacement  1960    Right Knee  . Lymph node biopsy  08/15/2012    Procedure: LYMPH NODE BIOPSY;  Surgeon: Scherry Ran, MD;  Location: [facility];  Service: General;  Laterality: Left;  Cervical Lymph Node Bx in Minor Room  . Bone marrow biopsy    . Bone marrow aspiration      There were no vitals taken for this visit.  Visit Diagnosis:  Weakness of both legs  Poor balance      Subjective Assessment  - 10/09/14 1705    Symptoms Feeling good today, reports he had fall onto toilet last week.  Feels his balance is still off, not comfident to walk with cane at home.     Currently in Pain? No/denies                    [facility] Adult PT Treatment/Exercise - 10/09/14 1838    Exercises   Exercises Knee/Hip;Balance   Knee/Hip Exercises: Aerobic   Stationary Bike NuStep hill 4, resistnace 4 x 10'   Knee/Hip Exercises: Standing   Heel Raises 10 reps   Heel Raises Limitations combined with functinal squatss   Functional Squat 10 reps   Functional Squat Limitations proper lifting yellow ball with heel raises   Gait Training 4 sets x 226 with SPC with DGI to improve balance and spatial awareness   Balance Exercises   Wall Bumps 10 reps   Wall Bumps Limitations hips and shoulders                  PT Short Term Goals - 10/09/14 1843    PT SHORT TERM GOAL #1   Title Pt to be able to stand for 10 minutes without increased pain to be able to make a sandwich for a meal   Status Achieved   PT SHORT TERM GOAL #2   Title Pt to be able to walk for 15 minutes for better health habits   Status Achieved   PT SHORT TERM GOAL #3   Title Pt to walk with 20 degree forward flexion     Status Achieved           PT Long Term Goals - 10/09/14 1843    PT LONG TERM GOAL #1   Title Pt to be able to stand for 20 minutes to socialize   Status On-going   PT LONG TERM GOAL #2   Title Pt to be comfortable walking inside with a cane   Status On-going   PT LONG TERM GOAL #3   Title Patient to be able to walk with his walker for 30 minutes at a time   Status Achieved   PT LONG TERM GOAL #4   Title forward bent posture to be decreased to only 10 degree forward bend     Status On-going   PT LONG TERM GOAL #5   Title PT to be able to SLS x 10 seconds to reduce risk of falling   Status On-going  Plan - 10/09/14 1840    Clinical Impression Statement Session focus DGI  techniques to improve confidence and balance with SPC with min assistance required especially when looking up, slow cadence from side to side and 6 point turns to opporiste direction.  Continued gluteal strenghtening incorporating UE flexion to improve balance and posture.  Pt continues to ambuate with trunk flexion, max cueing for posture through session to improve balance.  Resumed wall bumps for core strengthening to improve balance.  Pt limited by fatigue, required 3 seated rest breaks through session.     PT Next Visit Plan Continue to increase stability with goal of return to gait training with cane.  Progress balance activities.          Problem List Patient Active Problem List   Diagnosis Date Noted  . Acute on chronic diastolic congestive heart failure   . Carotid stenosis   . Coronary artery disease involving coronary bypass graft of native heart with other forms of angina pectoris   . Aortic stenosis   . Acute pulmonary edema 09/10/2014  . Weakness of both legs 05/09/2014  . Poor balance 05/09/2014  . Difficulty in walking(719.7) 05/09/2014  . Chest pain 03/23/2014  . CHF (congestive heart failure) 02/12/2014  . Exertional dyspnea 02/12/2014  . Chest pain of uncertain etiology 81/38/8719  . Diabetes 01/01/2014  . Chronic kidney disease (CKD) stage G3b/A2, moderately decreased glomerular filtration rate (GFR) between 30-44 mL/min/1.73 square meter and albuminuria creatinine ratio between 30-299 mg/g 12/14/2013  . NSTEMI (non-ST elevated myocardial infarction) 11/28/2013  . HCAP (healthcare-associated pneumonia) 11/27/2013  . Community acquired pneumonia 11/27/2013  . Acute on chronic systolic heart failure 59/74/7185  . Other pancytopenia 01/08/2013  . Non-ST elevation MI (NSTEMI) 01/06/2013  . High output heart failure 10/25/2012  . Myelodysplastic syndrome with 5 q minus 10/17/2012  . History of colonic polyps 02/28/2012  . Anemia 01/10/2012  . Angina effort 01/06/2012   . AVM (arteriovenous malformation) of colon without hemorrhage 01/04/2012  . GI bleeding 10/09/2011  . Long term current use of anticoagulant 12/22/2010  . CAROTID ARTERY STENOSIS 10/16/2010  . DYSLIPIDEMIA 04/11/2009  . Coronary atherosclerosis 04/11/2009  . Atrial fibrillation 04/11/2009  . Peripheral vascular disease 04/11/2009  . OSTEOARTHRITIS 04/11/2009   Ihor Austin, [place]  Aldona Lento 10/09/2014, 6:45 PM  [address] [place], [place], [address] Phone: [phone]   Fax:  [phone]

## 2014-10-10 ENCOUNTER — Ambulatory Visit (HOSPITAL_COMMUNITY)
Admission: RE | Admit: 2014-10-10 | Discharge: 2014-10-10 | Disposition: A | Discharge status: Home / Self Care | Payer: Medicare Other | Source: Ambulatory Visit | Attending: Pulmonary Disease | Admitting: Pulmonary Disease

## 2014-10-10 DIAGNOSIS — R29898 Other symptoms and signs involving the musculoskeletal system: Secondary | ICD-10-CM

## 2014-10-10 DIAGNOSIS — Z5189 Encounter for other specified aftercare: Secondary | ICD-10-CM | POA: Diagnosis not present

## 2014-10-10 DIAGNOSIS — R2689 Other abnormalities of gait and mobility: Secondary | ICD-10-CM

## 2014-10-10 NOTE — Therapy (Signed)
[place], [place], [address] Phone: [phone]   Fax:  [phone]  Physical Therapy Treatment  Patient Details  Name: [name] MRN: [id number] Date of Birth: 12/11/1931 Referring Provider:  [name], MD  Encounter Date: 10/10/2014      PT End of Session - 10/10/14 0843    Visit Number 35   Number of Visits 37   Date for PT Re-Evaluation 10/18/14   Authorization Type medicare   Authorization - Visit Number 39   Authorization - Number of Visits 37   PT Start Time 0800   PT Stop Time 0850   PT Time Calculation (min) 50 min   Equipment Utilized During Treatment Gait belt      Past Medical History  Diagnosis Date  . Essential hypertension, benign   . COPD (chronic obstructive pulmonary disease)   . Coronary atherosclerosis of native coronary artery     Multivessel status post CABG 1996  . Arthritis   . Atrial fibrillation     Coumadin stopped 12/2011 due to anemia  . Dysphagia   . Anemia     Requiring blood transfusions  . Carotid stenosis     Dr Scot Dock   . Diabetes mellitus, type 2   . Mixed hyperlipidemia     Statin intolerant  . Hematuria     Felt related to Foley insertion  . Cardiomyopathy     LVEF 45-50%  . Melena     EGD & Colonoscopy 09/2011 revealed gastritis, erosions, scars, diverticula, 8 colon polyps (largest 1.6cm, not removed 2/2 anticoagulation), small AVM in transverse colon  . History of tobacco abuse     Quit 1992  . Anxiety   . GERD (gastroesophageal reflux disease)   . Myocardial infarction   . Thrombocytopenia   . Myelodysplastic syndrome with 5 q minus 10/17/2012    On Revlimid 5 mg daily 21 days on and 7 days off  . Aortic stenosis     Mild  . CKD (chronic kidney disease) stage 3, GFR 30-59 ml/min   . Pneumonia 11/2013    history of  . Blood transfusion without reported diagnosis     Past Surgical History  Procedure Laterality Date  . Knee  surgery  1960    Right knee cartilage  . Rotator cuff repair  1990    right   . Inguinal hernia repair  2000 and 2010    left  . Cholecystectomy  05/2010    Lap chole with biologic mesh repair/reinforcement by  Dr Rise Patience  . Back surgery  02/2006    ruptured disk,  post op diskitis infection requiring prolonged hospital stay and  surgical I & D  . Tonsillectomy    . Esophagogastroduodenoscopy  10/08/2011    Procedure: ESOPHAGOGASTRODUODENOSCOPY (EGD);  Surgeon: [name], MD;  [facility];  Service: Endoscopy;  Laterality: N/A;  . Colonoscopy  10/08/2011    Procedure: COLONOSCOPY;  Surgeon: [name], MD;  [facility];  Service: Endoscopy;  Laterality: N/A;  . Cataract extraction, bilateral    . Peg placement  07/2006    placed due to prolonged infection, poor po intake, malnutrition during several week hospital stay resulting from ruptured disc that became infected following surgery  . Colonoscopy  01/14/2012    Procedure: COLONOSCOPY;  Surgeon: [name], MD;  [facility];  Service: Endoscopy;  Laterality: N/A;  945  . Coronary artery  bypass graft  1996    CABG x 4  . Ventral hernia repair    . Colonoscopy  04/20/2012    Procedure: COLONOSCOPY;  Surgeon: [name], MD;  [facility];  Service: Endoscopy;  Laterality: N/A;  1030  . Pr vein bypass graft,aorto-fem-pop  1996  . Spine surgery    . Joint replacement  1960    Right Knee  . Lymph node biopsy  08/15/2012    Procedure: LYMPH NODE BIOPSY;  Surgeon: Scherry Ran, MD;  Location: [facility];  Service: General;  Laterality: Left;  Cervical Lymph Node Bx in Minor Room  . Bone marrow biopsy    . Bone marrow aspiration      There were no vitals taken for this visit.  Visit Diagnosis:  Weakness of both legs  Poor balance      Subjective Assessment - 10/10/14 0801    Symptoms Pt states that he was very unsteady this morning    Currently in Pain? No/denies                     [facility] Adult PT Treatment/Exercise - 10/10/14 0813    Knee/Hip Exercises: Aerobic   Stationary Bike NuStep hill 3; level 5 x 10'   Balance Exercises: Standing   SLS Eyes open;5 reps   Balance Beam --  feet together lift B UE into flexion x 15   Retro Gait 1 rep   Sidestepping 1 rep;Theraband   Theraband Level (Sidestepping) Level 4 (Blue)  around table    Balance Exercises   Tandem Walking Other (comment)   Tandem Walking Limitations tandem stance 30" x 3   March on Foam/Wedge 10 reps   March on Foam/Wedge Limitations no foam    Gait on Ramp/Curb --  sit to stand x 15                  PT Short Term Goals - 10/09/14 1843    PT SHORT TERM GOAL #1   Title Pt to be able to stand for 10 minutes without increased pain to be able to make a sandwich for a meal   Status Achieved   PT SHORT TERM GOAL #2   Title Pt to be able to walk for 15 minutes for better health habits   Status Achieved   PT SHORT TERM GOAL #3   Title Pt to walk with 20 degree forward flexion     Status Achieved           PT Long Term Goals - 10/09/14 1843    PT LONG TERM GOAL #1   Title Pt to be able to stand for 20 minutes to socialize   Status On-going   PT LONG TERM GOAL #2   Title Pt to be comfortable walking inside with a cane   Status On-going   PT LONG TERM GOAL #3   Title Patient to be able to walk with his walker for 30 minutes at a time   Status Achieved   PT LONG TERM GOAL #4   Title forward bent posture to be decreased to only 10 degree forward bend     Status On-going   PT LONG TERM GOAL #5   Title PT to be able to SLS x 10 seconds to reduce risk of falling   Status On-going               Plan - 10/10/14 8786  Clinical Impression Statement Today treatment focused on balance as pt states his legs feel much stronger but he does not seem to be able to get his balance. All exercises needed therapist facilitation to make ensure pt safety.   Pt needed only two short rest breaks.  Pt was encouraged to join a gym after treatment ends.    PT Next Visit Plan Continue to increase stability with goal of return to gait training with cane.  Progress balance activities attempt balancing on foam next visit         Problem List Patient Active Problem List   Diagnosis Date Noted  . Acute on chronic diastolic congestive heart failure   . Carotid stenosis   . Coronary artery disease involving coronary bypass graft of native heart with other forms of angina pectoris   . Aortic stenosis   . Acute pulmonary edema 09/10/2014  . Weakness of both legs 05/09/2014  . Poor balance 05/09/2014  . Difficulty in walking(719.7) 05/09/2014  . Chest pain 03/23/2014  . CHF (congestive heart failure) 02/12/2014  . Exertional dyspnea 02/12/2014  . Chest pain of uncertain etiology 32/95/1884  . Diabetes 01/01/2014  . Chronic kidney disease (CKD) stage G3b/A2, moderately decreased glomerular filtration rate (GFR) between 30-44 mL/min/1.73 square meter and albuminuria creatinine ratio between 30-299 mg/g 12/14/2013  . NSTEMI (non-ST elevated myocardial infarction) 11/28/2013  . HCAP (healthcare-associated pneumonia) 11/27/2013  . Community acquired pneumonia 11/27/2013  . Acute on chronic systolic heart failure 16/60/6301  . Other pancytopenia 01/08/2013  . Non-ST elevation MI (NSTEMI) 01/06/2013  . High output heart failure 10/25/2012  . Myelodysplastic syndrome with 5 q minus 10/17/2012  . History of colonic polyps 02/28/2012  . Anemia 01/10/2012  . Angina effort 01/06/2012  . AVM (arteriovenous malformation) of colon without hemorrhage 01/04/2012  . GI bleeding 10/09/2011  . Long term current use of anticoagulant 12/22/2010  . CAROTID ARTERY STENOSIS 10/16/2010  . DYSLIPIDEMIA 04/11/2009  . Coronary atherosclerosis 04/11/2009  . Atrial fibrillation 04/11/2009  . Peripheral vascular disease 04/11/2009  . OSTEOARTHRITIS 04/11/2009     RUSSELL,CINDY PT 10/10/2014, 8:49 AM  [place] [address] [place], [place], [address] Phone: [phone]   Fax:  [phone]

## 2014-10-14 ENCOUNTER — Encounter (HOSPITAL_BASED_OUTPATIENT_CLINIC_OR_DEPARTMENT_OTHER): Payer: Medicare Other

## 2014-10-14 ENCOUNTER — Ambulatory Visit (HOSPITAL_COMMUNITY)
Admission: RE | Admit: 2014-10-14 | Discharge: 2014-10-14 | Disposition: A | Discharge status: Home / Self Care | Payer: Medicare Other | Source: Ambulatory Visit | Attending: Oncology | Admitting: Oncology

## 2014-10-14 ENCOUNTER — Other Ambulatory Visit (HOSPITAL_COMMUNITY): Payer: Self-pay | Admitting: Oncology

## 2014-10-14 DIAGNOSIS — R29898 Other symptoms and signs involving the musculoskeletal system: Secondary | ICD-10-CM

## 2014-10-14 DIAGNOSIS — Z5189 Encounter for other specified aftercare: Secondary | ICD-10-CM | POA: Diagnosis not present

## 2014-10-14 DIAGNOSIS — R2689 Other abnormalities of gait and mobility: Secondary | ICD-10-CM

## 2014-10-14 DIAGNOSIS — D46C Myelodysplastic syndrome with isolated del(5q) chromosomal abnormality: Secondary | ICD-10-CM

## 2014-10-14 LAB — CBC WITH DIFFERENTIAL/PLATELET
BASOS PCT: 2 % — AB (ref 0–1)
Basophils Absolute: 0.1 10*3/uL (ref 0.0–0.1)
Eosinophils Absolute: 0.3 10*3/uL (ref 0.0–0.7)
Eosinophils Relative: 10 % — ABNORMAL HIGH (ref 0–5)
HCT: 23.4 % — ABNORMAL LOW (ref 39.0–52.0)
Hemoglobin: 7.9 g/dL — ABNORMAL LOW (ref 13.0–17.0)
LYMPHS PCT: 36 % (ref 12–46)
Lymphs Abs: 1.1 10*3/uL (ref 0.7–4.0)
MCH: 31.9 pg (ref 26.0–34.0)
MCHC: 33.8 g/dL (ref 30.0–36.0)
MCV: 94.4 fL (ref 78.0–100.0)
MONOS PCT: 6 % (ref 3–12)
Monocytes Absolute: 0.2 10*3/uL (ref 0.1–1.0)
Neutro Abs: 1.4 10*3/uL — ABNORMAL LOW (ref 1.7–7.7)
Neutrophils Relative %: 46 % (ref 43–77)
PLATELETS: 78 10*3/uL — AB (ref 150–400)
RBC: 2.48 MIL/uL — ABNORMAL LOW (ref 4.22–5.81)
RDW: 18 % — ABNORMAL HIGH (ref 11.5–15.5)
WBC: 3 10*3/uL — ABNORMAL LOW (ref 4.0–10.5)

## 2014-10-14 LAB — PREPARE RBC (CROSSMATCH)

## 2014-10-14 NOTE — Therapy (Signed)
[place], [place], [address] Phone: [phone]   Fax:  [phone]  Physical Therapy Treatment  Patient Details  Name: [name] MRN: [id number] Date of Birth: 1932/09/02 Referring Provider:  Baird Cancer, PA-C  Encounter Date: 10/14/2014      PT End of Session - 10/14/14 0933    Visit Number 36   Number of Visits 37   Date for PT Re-Evaluation 10/18/14   Authorization Type medicare   Authorization - Visit Number 105   Authorization - Number of Visits 37   PT Start Time 0845   PT Stop Time 0935   PT Time Calculation (min) 50 min      Past Medical History  Diagnosis Date  . Essential hypertension, benign   . COPD (chronic obstructive pulmonary disease)   . Coronary atherosclerosis of native coronary artery     Multivessel status post CABG 1996  . Arthritis   . Atrial fibrillation     Coumadin stopped 12/2011 due to anemia  . Dysphagia   . Anemia     Requiring blood transfusions  . Carotid stenosis     Dr Scot Dock   . Diabetes mellitus, type 2   . Mixed hyperlipidemia     Statin intolerant  . Hematuria     Felt related to Foley insertion  . Cardiomyopathy     LVEF 45-50%  . Melena     EGD & Colonoscopy 09/2011 revealed gastritis, erosions, scars, diverticula, 8 colon polyps (largest 1.6cm, not removed 2/2 anticoagulation), small AVM in transverse colon  . History of tobacco abuse     Quit 1992  . Anxiety   . GERD (gastroesophageal reflux disease)   . Myocardial infarction   . Thrombocytopenia   . Myelodysplastic syndrome with 5 q minus 10/17/2012    On Revlimid 5 mg daily 21 days on and 7 days off  . Aortic stenosis     Mild  . CKD (chronic kidney disease) stage 3, GFR 30-59 ml/min   . Pneumonia 11/2013    history of  . Blood transfusion without reported diagnosis     Past Surgical History  Procedure Laterality Date  . Knee surgery  1960    Right knee cartilage  . Rotator  cuff repair  1990    right   . Inguinal hernia repair  2000 and 2010    left  . Cholecystectomy  05/2010    Lap chole with biologic mesh repair/reinforcement by  Dr Rise Patience  . Back surgery  02/2006    ruptured disk,  post op diskitis infection requiring prolonged hospital stay and  surgical I & D  . Tonsillectomy    . Esophagogastroduodenoscopy  10/08/2011    Procedure: ESOPHAGOGASTRODUODENOSCOPY (EGD);  Surgeon: [name], MD;  [facility];  Service: Endoscopy;  Laterality: N/A;  . Colonoscopy  10/08/2011    Procedure: COLONOSCOPY;  Surgeon: [name], MD;  [facility];  Service: Endoscopy;  Laterality: N/A;  . Cataract extraction, bilateral    . Peg placement  07/2006    placed due to prolonged infection, poor po intake, malnutrition during several week hospital stay resulting from ruptured disc that became infected following surgery  . Colonoscopy  01/14/2012    Procedure: COLONOSCOPY;  Surgeon: [name], MD;  [facility];  Service: Endoscopy;  Laterality: N/A;  945  . Coronary artery bypass graft  1996    CABG  x 4  . Ventral hernia repair    . Colonoscopy  04/20/2012    Procedure: COLONOSCOPY;  Surgeon: [name], MD;  [facility];  Service: Endoscopy;  Laterality: N/A;  1030  . Pr vein bypass graft,aorto-fem-pop  1996  . Spine surgery    . Joint replacement  1960    Right Knee  . Lymph node biopsy  08/15/2012    Procedure: LYMPH NODE BIOPSY;  Surgeon: Scherry Ran, MD;  Location: [facility];  Service: General;  Laterality: Left;  Cervical Lymph Node Bx in Minor Room  . Bone marrow biopsy    . Bone marrow aspiration      There were no vitals taken for this visit.  Visit Diagnosis:  Weakness of both legs  Poor balance      Subjective Assessment - 10/14/14 0910    Symptoms (p) Pt states that he has been walking around with 2# weights on his legs trying to get them stronger.    Currently in Pain? (p)  Yes   Pain Location (p) Calf                    [facility] Adult PT Treatment/Exercise - 10/14/14 0930    Lumbar Exercises: Stretches   Passive Hamstring Stretch 1 rep;60 seconds   Passive Hamstring Stretch Limitations slant board for gastroc stretch   Lumbar Exercises: Aerobic   Stationary Bike Nustep hills #3; Level5 x 10:00   Lumbar Exercises: Standing   Other Standing Lumbar Exercises tandem gt on beam; retro on floor; marching on foam x 10   Other Standing Lumbar Exercises gt with cane.     Lumbar Exercises: Seated   Sit to Stand 20 reps  10 with Rt leg back 10 with Lt leg back    Manual Therapy   Manual Therapy Massage   Massage Lt calf                   PT Short Term Goals - 10/09/14 1843    PT SHORT TERM GOAL #1   Title Pt to be able to stand for 10 minutes without increased pain to be able to make a sandwich for a meal   Status Achieved   PT SHORT TERM GOAL #2   Title Pt to be able to walk for 15 minutes for better health habits   Status Achieved   PT SHORT TERM GOAL #3   Title Pt to walk with 20 degree forward flexion     Status Achieved           PT Long Term Goals - 10/09/14 1843    PT LONG TERM GOAL #1   Title Pt to be able to stand for 20 minutes to socialize   Status On-going   PT LONG TERM GOAL #2   Title Pt to be comfortable walking inside with a cane   Status On-going   PT LONG TERM GOAL #3   Title Patient to be able to walk with his walker for 30 minutes at a time   Status Achieved   PT LONG TERM GOAL #4   Title forward bent posture to be decreased to only 10 degree forward bend     Status On-going   PT LONG TERM GOAL #5   Title PT to be able to SLS x 10 seconds to reduce risk of falling   Status On-going  Plan - 10/14/14 0934    Clinical Impression Statement Pt treatment today focused on balance.  Added foam to treatment which significantly challenged pt.  Pt needed therapist facilitiation to keep  center of gravity within his base of support.  Pt only needed one rest break today.    PT Next Visit Plan PT to be reassessed next treatement.        Problem List Patient Active Problem List   Diagnosis Date Noted  . Acute on chronic diastolic congestive heart failure   . Carotid stenosis   . Coronary artery disease involving coronary bypass graft of native heart with other forms of angina pectoris   . Aortic stenosis   . Acute pulmonary edema 09/10/2014  . Weakness of both legs 05/09/2014  . Poor balance 05/09/2014  . Difficulty in walking(719.7) 05/09/2014  . Chest pain 03/23/2014  . CHF (congestive heart failure) 02/12/2014  . Exertional dyspnea 02/12/2014  . Chest pain of uncertain etiology 64/31/4276  . Diabetes 01/01/2014  . Chronic kidney disease (CKD) stage G3b/A2, moderately decreased glomerular filtration rate (GFR) between 30-44 mL/min/1.73 square meter and albuminuria creatinine ratio between 30-299 mg/g 12/14/2013  . NSTEMI (non-ST elevated myocardial infarction) 11/28/2013  . HCAP (healthcare-associated pneumonia) 11/27/2013  . Community acquired pneumonia 11/27/2013  . Acute on chronic systolic heart failure 70/07/33  . Other pancytopenia 01/08/2013  . Non-ST elevation MI (NSTEMI) 01/06/2013  . High output heart failure 10/25/2012  . Myelodysplastic syndrome with 5 q minus 10/17/2012  . History of colonic polyps 02/28/2012  . Anemia 01/10/2012  . Angina effort 01/06/2012  . AVM (arteriovenous malformation) of colon without hemorrhage 01/04/2012  . GI bleeding 10/09/2011  . Long term current use of anticoagulant 12/22/2010  . CAROTID ARTERY STENOSIS 10/16/2010  . DYSLIPIDEMIA 04/11/2009  . Coronary atherosclerosis 04/11/2009  . Atrial fibrillation 04/11/2009  . Peripheral vascular disease 04/11/2009  . OSTEOARTHRITIS 04/11/2009    RUSSELL,CINDY PT  10/14/2014, 9:36 AM  [place], [place], [address] Phone: [phone]   Fax:  [phone]

## 2014-10-14 NOTE — Progress Notes (Signed)
Patient has been scheduled and notified.

## 2014-10-14 NOTE — Addendum Note (Signed)
Addended by: Kurtis Bushman A on: 10/14/2014 11:50 AM   Modules accepted: Orders

## 2014-10-14 NOTE — Progress Notes (Addendum)
LABS FOR [facility].. ADD [id number],[id number]

## 2014-10-15 ENCOUNTER — Encounter (HOSPITAL_COMMUNITY): Payer: Self-pay

## 2014-10-15 ENCOUNTER — Encounter (HOSPITAL_BASED_OUTPATIENT_CLINIC_OR_DEPARTMENT_OTHER): Payer: Medicare Other

## 2014-10-15 VITALS — BP 142/60 | HR 64 | Temp 97.3°F | Resp 18

## 2014-10-15 DIAGNOSIS — D46C Myelodysplastic syndrome with isolated del(5q) chromosomal abnormality: Secondary | ICD-10-CM | POA: Diagnosis not present

## 2014-10-15 MED ORDER — ACETAMINOPHEN 325 MG PO TABS
650.0000 mg | ORAL_TABLET | Freq: Once | ORAL | Status: AC
  Administered 2014-10-15: 650 mg via ORAL
  Filled 2014-10-15: qty 2

## 2014-10-15 MED ORDER — DIPHENHYDRAMINE HCL 25 MG PO CAPS
25.0000 mg | ORAL_CAPSULE | Freq: Once | ORAL | Status: AC
  Administered 2014-10-15: 25 mg via ORAL
  Filled 2014-10-15: qty 1

## 2014-10-15 MED ORDER — FUROSEMIDE 10 MG/ML IJ SOLN
20.0000 mg | Freq: Once | INTRAMUSCULAR | Status: AC
  Administered 2014-10-15: 20 mg via INTRAVENOUS
  Filled 2014-10-15: qty 2

## 2014-10-15 MED ORDER — SODIUM CHLORIDE 0.9 % IJ SOLN: 10.0000 mL | INTRAMUSCULAR | Status: DC | PRN

## 2014-10-15 MED ORDER — SODIUM CHLORIDE 0.9 % IV SOLN
250.0000 mL | Freq: Once | INTRAVENOUS | Status: AC
  Administered 2014-10-15: 250 mL via INTRAVENOUS

## 2014-10-15 NOTE — Patient Instructions (Signed)
[place] at [facility] Discharge Instructions  RECOMMENDATIONS MADE BY THE CONSULTANT AND ANY TEST RESULTS WILL BE SENT TO YOUR REFERRING PHYSICIAN.  Today you received 2 units of blood.  Return as scheduled for lab work.  Thank you for choosing [facility] to provide your oncology and hematology care.  To afford each patient quality time with our provider, please arrive at least 15 minutes before your scheduled appointment time.    You need to re-schedule your appointment should you arrive 10 or more minutes late.  We strive to give you quality time with our providers, and arriving late affects you and other patients whose appointments are after yours.  Also, if you no show three or more times for appointments you may be dismissed from the clinic at the providers discretion.     Again, thank you for choosing [facility].  Our hope is that these requests will decrease the amount of time that you wait before being seen by our physicians.       _____________________________________________________________  Should you have questions after your visit to [place], please contact our office at [phone] between the hours of 8:30 a.m. and 4:30 p.m.  Voicemails left after 4:30 p.m. will not be returned until the following business day.  For prescription refill requests, have your pharmacy contact our office.

## 2014-10-16 ENCOUNTER — Ambulatory Visit (HOSPITAL_COMMUNITY)
Admission: RE | Admit: 2014-10-16 | Discharge: 2014-10-16 | Disposition: A | Discharge status: Home / Self Care | Payer: Medicare Other | Source: Ambulatory Visit | Attending: Oncology | Admitting: Oncology

## 2014-10-16 DIAGNOSIS — R2689 Other abnormalities of gait and mobility: Secondary | ICD-10-CM

## 2014-10-16 DIAGNOSIS — Z5189 Encounter for other specified aftercare: Secondary | ICD-10-CM | POA: Diagnosis not present

## 2014-10-16 DIAGNOSIS — R29898 Other symptoms and signs involving the musculoskeletal system: Secondary | ICD-10-CM

## 2014-10-16 LAB — TYPE AND SCREEN
ABO/RH(D): A NEG
ANTIBODY SCREEN: NEGATIVE
UNIT DIVISION: 0
Unit division: 0

## 2014-10-16 NOTE — Therapy (Signed)
[place], [place], [address] Phone: [phone]   Fax:  [phone]  Physical Therapy Evaluation  Patient Details  Name: [name] MRN: [id number] Date of Birth: 07-18-1932 Referring Provider:  Baird Cancer, PA-C  Encounter Date: 10/16/2014      PT End of Session - 10/16/14 1009    Visit Number 37   Number of Visits 45   Date for PT Re-Evaluation 11/15/14   Authorization Type medicare   Authorization - Visit Number 25   Authorization - Number of Visits 40   PT Start Time 0800   PT Stop Time 0845   PT Time Calculation (min) 45 min   Equipment Utilized During Treatment Gait belt   Activity Tolerance Patient tolerated treatment well      Past Medical History  Diagnosis Date  . Essential hypertension, benign   . COPD (chronic obstructive pulmonary disease)   . Coronary atherosclerosis of native coronary artery     Multivessel status post CABG 1996  . Arthritis   . Atrial fibrillation     Coumadin stopped 12/2011 due to anemia  . Dysphagia   . Anemia     Requiring blood transfusions  . Carotid stenosis     Dr Scot Dock   . Diabetes mellitus, type 2   . Mixed hyperlipidemia     Statin intolerant  . Hematuria     Felt related to Foley insertion  . Cardiomyopathy     LVEF 45-50%  . Melena     EGD & Colonoscopy 09/2011 revealed gastritis, erosions, scars, diverticula, 8 colon polyps (largest 1.6cm, not removed 2/2 anticoagulation), small AVM in transverse colon  . History of tobacco abuse     Quit 1992  . Anxiety   . GERD (gastroesophageal reflux disease)   . Myocardial infarction   . Thrombocytopenia   . Myelodysplastic syndrome with 5 q minus 10/17/2012    On Revlimid 5 mg daily 21 days on and 7 days off  . Aortic stenosis     Mild  . CKD (chronic kidney disease) stage 3, GFR 30-59 ml/min   . Pneumonia 11/2013    history of  . Blood transfusion without reported diagnosis     Past  Surgical History  Procedure Laterality Date  . Knee surgery  1960    Right knee cartilage  . Rotator cuff repair  1990    right   . Inguinal hernia repair  2000 and 2010    left  . Cholecystectomy  05/2010    Lap chole with biologic mesh repair/reinforcement by  Dr Rise Patience  . Back surgery  02/2006    ruptured disk,  post op diskitis infection requiring prolonged hospital stay and  surgical I & D  . Tonsillectomy    . Esophagogastroduodenoscopy  10/08/2011    Procedure: ESOPHAGOGASTRODUODENOSCOPY (EGD);  Surgeon: [name], MD;  [facility];  Service: Endoscopy;  Laterality: N/A;  . Colonoscopy  10/08/2011    Procedure: COLONOSCOPY;  Surgeon: [name], MD;  [facility];  Service: Endoscopy;  Laterality: N/A;  . Cataract extraction, bilateral    . Peg placement  07/2006    placed due to prolonged infection, poor po intake, malnutrition during several week hospital stay resulting from ruptured disc that became infected following surgery  . Colonoscopy  01/14/2012    Procedure: COLONOSCOPY;  Surgeon: [name], MD;  [facility];  Service: Endoscopy;  Laterality: N/A;  945  . Coronary artery bypass graft  1996    CABG x 4  . Ventral hernia repair    . Colonoscopy  04/20/2012    Procedure: COLONOSCOPY;  Surgeon: [name], MD;  [facility];  Service: Endoscopy;  Laterality: N/A;  1030  . Pr vein bypass graft,aorto-fem-pop  1996  . Spine surgery    . Joint replacement  1960    Right Knee  . Lymph node biopsy  08/15/2012    Procedure: LYMPH NODE BIOPSY;  Surgeon: Scherry Ran, MD;  Location: [facility];  Service: General;  Laterality: Left;  Cervical Lymph Node Bx in Minor Room  . Bone marrow biopsy    . Bone marrow aspiration      There were no vitals taken for this visit.  Visit Diagnosis:  Weakness of both legs  Poor balance      Subjective Assessment - 10/16/14 0833    Symptoms Mr. Caliendo states that he  can tell that he is getting stronger.   Pt states that he is having difficulty getting out of the bed.           [facility] PT Assessment - 10/16/14 0001    Assessment   Medical Diagnosis Difficutly walking   Next MD Visit Blood work every Monday; Luan Pulling unscheduled    Prior Therapy 2014 for back    Strength   Right Hip Flexion 5/5  4/5   Right Hip Extension 3-/5   Right Hip ABduction 5/5  was 4+/5    Left Hip Flexion 5/5  was 4/5   Left Hip Extension 3-/5  was 2+/5    Left Hip ABduction 5/5  was 5/5   Right Knee Flexion --  4-/5 was 5/5   Right Knee Extension 5/5  was 4/5   Left Knee Flexion 4-/5  was 3+/5    Left Knee Extension 5/5  was 4/5   Right Ankle Dorsiflexion 5/5  was 4/5    Left Ankle Dorsiflexion 5/5  was 4/5   Berg Balance Test   Sit to Stand Able to stand without using hands and stabilize independently   Standing Unsupported Able to stand safely 2 minutes   Sitting with Back Unsupported but Feet Supported on Floor or Stool Able to sit safely and securely 2 minutes   Stand to Sit Controls descent by using hands   Transfers Able to transfer safely, definite need of hands   Standing Unsupported with Eyes Closed Able to stand 10 seconds safely   Standing Ubsupported with Feet Together Able to place feet together independently and stand 1 minute safely   From Standing, Reach Forward with Outstretched Arm Can reach confidently >25 cm (10")   From Standing Position, Pick up Object from Floor Able to pick up shoe safely and easily   From Standing Position, Turn to Look Behind Over each Shoulder Looks behind one side only/other side shows less weight shift   Turn 360 Degrees Able to turn 360 degrees safely but slowly   Standing Unsupported, Alternately Place Feet on Step/Stool Able to stand independently and safely and complete 8 steps in 20 seconds   Standing Unsupported, One Foot in Front Able to take small step independently and hold 30 seconds   Standing on One  Leg Able to lift leg independently and hold equal to or more than 3 seconds   Total Score 47  [facility] Adult PT Treatment/Exercise - 10/16/14 0821    Knee/Hip Exercises: Aerobic   Stationary Bike NuStep hill 3; level 5 x 10'   Knee/Hip Exercises: Standing   Stairs reciprocal x 2 RT with cane   SLS x 5 B    Other Standing Knee Exercises stand back at wall B UE flextion x 10    Other Standing Knee Exercises tandem stance x 30 " x 2   Knee/Hip Exercises: Prone   Hamstring Curl 15 reps;Limitations   Hamstring Curl Limitations 5#   Hip Extension 15 reps   Other Prone Exercises POE x 2'                  PT Short Term Goals - 10/16/14 1019    PT SHORT TERM GOAL #1   Title Pt to be able to stand for 10 minutes without increased pain to be able to make a sandwich for a meal   Time 4   Period Weeks   Status Achieved   PT SHORT TERM GOAL #2   Title Pt to be able to walk for 15 minutes for better health habits   Time 4   Period Weeks   Status Achieved   PT SHORT TERM GOAL #3   Title Pt to walk with 20 degree forward flexion     Period Weeks   Status Achieved           PT Long Term Goals - 10/16/14 1019    PT LONG TERM GOAL #1   Title Pt to be able to stand for 20 minutes to socialize   Time 12   Period Weeks   Status On-going   PT LONG TERM GOAL #2   Title Pt to be comfortable walking inside with a cane   Baseline 08/19/14 : Pt reports use of cane 90% of the time in the home.    Time 12   Status On-going   PT LONG TERM GOAL #3   Title Patient to be able to walk with his walker for 30 minutes at a time   Time 8   Period Weeks   Status Achieved   PT LONG TERM GOAL #4   Title forward bent posture to be decreased to only 10 degree forward bend     Time 8   Period Weeks   Status On-going   PT LONG TERM GOAL #5   Title PT to be able to SLS x 10 seconds to reduce risk of falling   Time 12   Period Weeks   Status On-going   Additional  Long Term Goals   Additional Long Term Goals Yes   PT LONG TERM GOAL #6   Title Pt to be able to be I with bed mobility    Time 16   Period Weeks   Status New               Plan - 10/16/14 1011    Clinical Impression Statement Pt is improving in strength and balance his progress continues to be slow due to emotional and physical challenges;(pt wife died last month causing a month break in therapy and pt completing a HEP, pt being admitted into the hospital due to medical issues but continues to remain highly motivated.  Mr. Gadson neesd sto work on his back , hip extensor and knee fllexion strength.  We will adjust his current treatment working on functional strengthening to directly targeting these mm to see how pt progresses  as pt is currenlty a risk of falling with his weak mm.    Pt will benefit from skilled therapeutic intervention in order to improve on the following deficits Abnormal gait;Decreased activity tolerance;Decreased balance;Difficulty walking;Decreased strength   Rehab Potential Good   PT Frequency 2x / week   PT Duration 4 weeks   PT Treatment/Interventions Balance training;Therapeutic exercise;Therapeutic activities;Gait training;Stair training;Functional mobility training   PT Next Visit Plan Pt to complete prone exercises including knee flexion with five pound wt; POE, hip extension, shoulder extension, along with balance activity.  Work on bed mobility          Problem List Patient Active Problem List   Diagnosis Date Noted  . Acute on chronic diastolic congestive heart failure   . Carotid stenosis   . Coronary artery disease involving coronary bypass graft of native heart with other forms of angina pectoris   . Aortic stenosis   . Acute pulmonary edema 09/10/2014  . Weakness of both legs 05/09/2014  . Poor balance 05/09/2014  . Difficulty in walking(719.7) 05/09/2014  . Chest pain 03/23/2014  . CHF (congestive heart failure) 02/12/2014  .  Exertional dyspnea 02/12/2014  . Chest pain of uncertain etiology 45/40/9811  . Diabetes 01/01/2014  . Chronic kidney disease (CKD) stage G3b/A2, moderately decreased glomerular filtration rate (GFR) between 30-44 mL/min/1.73 square meter and albuminuria creatinine ratio between 30-299 mg/g 12/14/2013  . NSTEMI (non-ST elevated myocardial infarction) 11/28/2013  . HCAP (healthcare-associated pneumonia) 11/27/2013  . Community acquired pneumonia 11/27/2013  . Acute on chronic systolic heart failure 91/47/8295  . Other pancytopenia 01/08/2013  . Non-ST elevation MI (NSTEMI) 01/06/2013  . High output heart failure 10/25/2012  . Myelodysplastic syndrome with 5 q minus 10/17/2012  . History of colonic polyps 02/28/2012  . Anemia 01/10/2012  . Angina effort 01/06/2012  . AVM (arteriovenous malformation) of colon without hemorrhage 01/04/2012  . GI bleeding 10/09/2011  . Long term current use of anticoagulant 12/22/2010  . CAROTID ARTERY STENOSIS 10/16/2010  . DYSLIPIDEMIA 04/11/2009  . Coronary atherosclerosis 04/11/2009  . Atrial fibrillation 04/11/2009  . Peripheral vascular disease 04/11/2009  . OSTEOARTHRITIS 04/11/2009    Lakresha Stifter,CINDY PT 10/16/2014, 10:22 AM  [place], [place], [address] Phone: [phone]   Fax:  [phone]

## 2014-10-21 ENCOUNTER — Ambulatory Visit (HOSPITAL_COMMUNITY)
Admission: RE | Admit: 2014-10-21 | Discharge: 2014-10-21 | Disposition: A | Discharge status: Home / Self Care | Payer: Medicare Other | Source: Ambulatory Visit | Attending: Hematology and Oncology | Admitting: Hematology and Oncology

## 2014-10-21 ENCOUNTER — Encounter (HOSPITAL_BASED_OUTPATIENT_CLINIC_OR_DEPARTMENT_OTHER): Payer: Medicare Other

## 2014-10-21 ENCOUNTER — Other Ambulatory Visit (HOSPITAL_COMMUNITY): Payer: Self-pay | Admitting: Oncology

## 2014-10-21 DIAGNOSIS — Z5189 Encounter for other specified aftercare: Secondary | ICD-10-CM | POA: Diagnosis not present

## 2014-10-21 DIAGNOSIS — D46C Myelodysplastic syndrome with isolated del(5q) chromosomal abnormality: Secondary | ICD-10-CM | POA: Diagnosis not present

## 2014-10-21 DIAGNOSIS — R29898 Other symptoms and signs involving the musculoskeletal system: Secondary | ICD-10-CM

## 2014-10-21 DIAGNOSIS — R2689 Other abnormalities of gait and mobility: Secondary | ICD-10-CM

## 2014-10-21 LAB — CBC WITH DIFFERENTIAL/PLATELET
BASOS ABS: 0 10*3/uL (ref 0.0–0.1)
BASOS PCT: 1 % (ref 0–1)
EOS PCT: 7 % — AB (ref 0–5)
Eosinophils Absolute: 0.2 10*3/uL (ref 0.0–0.7)
HEMATOCRIT: 25.3 % — AB (ref 39.0–52.0)
HEMOGLOBIN: 8.6 g/dL — AB (ref 13.0–17.0)
LYMPHS PCT: 27 % (ref 12–46)
Lymphs Abs: 0.8 10*3/uL (ref 0.7–4.0)
MCH: 31.7 pg (ref 26.0–34.0)
MCHC: 34 g/dL (ref 30.0–36.0)
MCV: 93.4 fL (ref 78.0–100.0)
MONO ABS: 0.2 10*3/uL (ref 0.1–1.0)
MONOS PCT: 6 % (ref 3–12)
NEUTROS ABS: 1.8 10*3/uL (ref 1.7–7.7)
NEUTROS PCT: 58 % (ref 43–77)
Platelets: 88 10*3/uL — ABNORMAL LOW (ref 150–400)
RBC: 2.71 MIL/uL — AB (ref 4.22–5.81)
RDW: 17.3 % — AB (ref 11.5–15.5)
WBC: 3 10*3/uL — ABNORMAL LOW (ref 4.0–10.5)

## 2014-10-21 LAB — COMPREHENSIVE METABOLIC PANEL
ALBUMIN: 3.5 g/dL (ref 3.5–5.2)
ALT: 27 U/L (ref 0–53)
AST: 27 U/L (ref 0–37)
Alkaline Phosphatase: 74 U/L (ref 39–117)
Anion gap: 8 (ref 5–15)
BUN: 25 mg/dL — AB (ref 6–23)
CHLORIDE: 103 mmol/L (ref 96–112)
CO2: 28 mmol/L (ref 19–32)
Calcium: 9.6 mg/dL (ref 8.4–10.5)
Creatinine, Ser: 1.3 mg/dL (ref 0.50–1.35)
GFR calc Af Amer: 58 mL/min — ABNORMAL LOW (ref >90)
GFR calc non Af Amer: 50 mL/min — ABNORMAL LOW (ref >90)
Glucose, Bld: 274 mg/dL — ABNORMAL HIGH (ref 70–99)
POTASSIUM: 3.8 mmol/L (ref 3.5–5.1)
Sodium: 139 mmol/L (ref 135–145)
TOTAL PROTEIN: 6.5 g/dL (ref 6.0–8.3)
Total Bilirubin: 1 mg/dL (ref 0.3–1.2)

## 2014-10-21 LAB — PREPARE RBC (CROSSMATCH)

## 2014-10-21 NOTE — Progress Notes (Signed)
Patient notified of need for transfusion this week and scheduled for Wednesday

## 2014-10-21 NOTE — Addendum Note (Signed)
Addended by: Kurtis Bushman A on: 10/21/2014 12:42 PM   Modules accepted: Orders

## 2014-10-21 NOTE — Progress Notes (Signed)
LABS FOR [facility],CMP

## 2014-10-21 NOTE — Therapy (Signed)
[place], [place], [address] Phone: [phone]   Fax:  [phone]  Physical Therapy Treatment  Patient Details  Name: [name] MRN: [id number] Date of Birth: [birth year] Referring Provider:  Baird Cancer, PA-C  Encounter Date: 10/21/2014      PT End of Session - 10/21/14 1353    Visit Number 38   Number of Visits 45   Date for PT Re-Evaluation 11/15/14   Authorization Type medicare   Authorization - Visit Number 84   Authorization - Number of Visits 40   PT Start Time 3662   PT Stop Time 1402   PT Time Calculation (min) 57 min   Activity Tolerance Patient tolerated treatment well   Behavior During Therapy [facility] for tasks assessed/performed      Past Medical History  Diagnosis Date  . Essential hypertension, benign   . COPD (chronic obstructive pulmonary disease)   . Coronary atherosclerosis of native coronary artery     Multivessel status post CABG 1996  . Arthritis   . Atrial fibrillation     Coumadin stopped 12/2011 due to anemia  . Dysphagia   . Anemia     Requiring blood transfusions  . Carotid stenosis     Dr Scot Dock   . Diabetes mellitus, type 2   . Mixed hyperlipidemia     Statin intolerant  . Hematuria     Felt related to Foley insertion  . Cardiomyopathy     LVEF 45-50%  . Melena     EGD & Colonoscopy 09/2011 revealed gastritis, erosions, scars, diverticula, 8 colon polyps (largest 1.6cm, not removed 2/2 anticoagulation), small AVM in transverse colon  . History of tobacco abuse     Quit 1992  . Anxiety   . GERD (gastroesophageal reflux disease)   . Myocardial infarction   . Thrombocytopenia   . Myelodysplastic syndrome with 5 q minus 10/17/2012    On Revlimid 5 mg daily 21 days on and 7 days off  . Aortic stenosis     Mild  . CKD (chronic kidney disease) stage 3, GFR 30-59 ml/min   . Pneumonia 11/2013    history of  . Blood transfusion without reported diagnosis      Past Surgical History  Procedure Laterality Date  . Knee surgery  1960    Right knee cartilage  . Rotator cuff repair  1990    right   . Inguinal hernia repair  2000 and 2010    left  . Cholecystectomy  05/2010    Lap chole with biologic mesh repair/reinforcement by  Dr Rise Patience  . Back surgery  02/2006    ruptured disk,  post op diskitis infection requiring prolonged hospital stay and  surgical I & D  . Tonsillectomy    . Esophagogastroduodenoscopy  10/08/2011    Procedure: ESOPHAGOGASTRODUODENOSCOPY (EGD);  Surgeon: [name], MD;  [facility];  Service: Endoscopy;  Laterality: N/A;  . Colonoscopy  10/08/2011    Procedure: COLONOSCOPY;  Surgeon: [name], MD;  [facility];  Service: Endoscopy;  Laterality: N/A;  . Cataract extraction, bilateral    . Peg placement  07/2006    placed due to prolonged infection, poor po intake, malnutrition during several week hospital stay resulting from ruptured disc that became infected following surgery  . Colonoscopy  01/14/2012    Procedure: COLONOSCOPY;  Surgeon: [name], MD;  [facility];  Service: Endoscopy;  Laterality: N/A;  945  . Coronary artery bypass graft  1996    CABG x 4  . Ventral hernia repair    . Colonoscopy  04/20/2012    Procedure: COLONOSCOPY;  Surgeon: [name], MD;  [facility];  Service: Endoscopy;  Laterality: N/A;  1030  . Pr vein bypass graft,aorto-fem-pop  1996  . Spine surgery    . Joint replacement  1960    Right Knee  . Lymph node biopsy  08/15/2012    Procedure: LYMPH NODE BIOPSY;  Surgeon: Scherry Ran, MD;  Location: [facility];  Service: General;  Laterality: Left;  Cervical Lymph Node Bx in Minor Room  . Bone marrow biopsy    . Bone marrow aspiration      There were no vitals taken for this visit.  Visit Diagnosis:  Weakness of both legs  Poor balance                  [facility] Adult PT Treatment/Exercise - 10/21/14  0001    Lumbar Exercises: Stretches   Passive Hamstring Stretch 2 reps;30 seconds   Passive Hamstring Stretch Limitations Slantboard   Quad Stretch 1 rep;30 seconds   Quad Stretch Limitations Prone with rope   Lumbar Exercises: Aerobic   Stationary Bike Nustep hills #3; Level5 x 10:00   Lumbar Exercises: Seated   Sit to Stand 15 reps  3x5, no UE   Lumbar Exercises: Prone   Single Arm Raise 10 reps;Left;Right   Single Arm Raises Limitations alternating Rt and Lt arm raise   Knee/Hip Exercises: Aerobic   Stationary Bike see above   Knee/Hip Exercises: Standing   SLS x 5 B    Knee/Hip Exercises: Supine   Bridges 2 sets;10 reps   Knee/Hip Exercises: Prone   Hamstring Curl 2 sets;10 reps   Hamstring Curl Limitations 5#   Hip Extension 2 sets;10 reps;Both   Hip Extension Limitations 1#   Other Prone Exercises POE x 2'                  PT Short Term Goals - 10/16/14 1019    PT SHORT TERM GOAL #1   Title Pt to be able to stand for 10 minutes without increased pain to be able to make a sandwich for a meal   Time 4   Period Weeks   Status Achieved   PT SHORT TERM GOAL #2   Title Pt to be able to walk for 15 minutes for better health habits   Time 4   Period Weeks   Status Achieved   PT SHORT TERM GOAL #3   Title Pt to walk with 20 degree forward flexion     Period Weeks   Status Achieved           PT Long Term Goals - 10/16/14 1019    PT LONG TERM GOAL #1   Title Pt to be able to stand for 20 minutes to socialize   Time 12   Period Weeks   Status On-going   PT LONG TERM GOAL #2   Title Pt to be comfortable walking inside with a cane   Baseline 08/19/14 : Pt reports use of cane 90% of the time in the home.    Time 12   Status On-going   PT LONG TERM GOAL #3   Title Patient to be able to walk with his walker for 30 minutes at a time   Time 8  Period Weeks   Status Achieved   PT LONG TERM GOAL #4   Title forward bent posture to be decreased to only 10  degree forward bend     Time 8   Period Weeks   Status On-going   PT LONG TERM GOAL #5   Title PT to be able to SLS x 10 seconds to reduce risk of falling   Time 12   Period Weeks   Status On-going   Additional Long Term Goals   Additional Long Term Goals Yes   PT LONG TERM GOAL #6   Title Pt to be able to be I with bed mobility    Time 16   Period Weeks   Status New               Plan - 10/21/14 1354    Clinical Impression Statement Treatment session focused on strengthening exercises for hip extensors, knee flexors, and back musculature with prone exercises.  Pt had limited hip extension on the Rt on first trial, requiring VC for increasing hip extension for increased strengthening.   Pt was able to amb around clinic, with close SBA, holding RW in the air as pt reports this is how he "tries" to ambulate at home; recommended pt to use std cane at home for safety and balance with gait.     Pt will benefit from skilled therapeutic intervention in order to improve on the following deficits Abnormal gait;Decreased activity tolerance;Decreased balance;Difficulty walking;Decreased strength   Rehab Potential Good   PT Frequency 2x / week   PT Duration 4 weeks   PT Treatment/Interventions Balance training;Therapeutic exercise;Therapeutic activities;Gait training;Stair training;Functional mobility training   PT Next Visit Plan Re-start balance activities, and gait training with use of std cane or no AD as tolerated.         Problem List Patient Active Problem List   Diagnosis Date Noted  . Acute on chronic diastolic congestive heart failure   . Carotid stenosis   . Coronary artery disease involving coronary bypass graft of native heart with other forms of angina pectoris   . Aortic stenosis   . Acute pulmonary edema 09/10/2014  . Weakness of both legs 05/09/2014  . Poor balance 05/09/2014  . Difficulty in walking(719.7) 05/09/2014  . Chest pain 03/23/2014  . CHF (congestive  heart failure) 02/12/2014  . Exertional dyspnea 02/12/2014  . Chest pain of uncertain etiology 52/84/1324  . Diabetes 01/01/2014  . Chronic kidney disease (CKD) stage G3b/A2, moderately decreased glomerular filtration rate (GFR) between 30-44 mL/min/1.73 square meter and albuminuria creatinine ratio between 30-299 mg/g 12/14/2013  . NSTEMI (non-ST elevated myocardial infarction) 11/28/2013  . HCAP (healthcare-associated pneumonia) 11/27/2013  . Community acquired pneumonia 11/27/2013  . Acute on chronic systolic heart failure 40/06/2724  . Other pancytopenia 01/08/2013  . Non-ST elevation MI (NSTEMI) 01/06/2013  . High output heart failure 10/25/2012  . Myelodysplastic syndrome with 5 q minus 10/17/2012  . History of colonic polyps 02/28/2012  . Anemia 01/10/2012  . Angina effort 01/06/2012  . AVM (arteriovenous malformation) of colon without hemorrhage 01/04/2012  . GI bleeding 10/09/2011  . Long term current use of anticoagulant 12/22/2010  . CAROTID ARTERY STENOSIS 10/16/2010  . DYSLIPIDEMIA 04/11/2009  . Coronary atherosclerosis 04/11/2009  . Atrial fibrillation 04/11/2009  . Peripheral vascular disease 04/11/2009  . OSTEOARTHRITIS 04/11/2009    Lonna Cobb, DPT [phone]  10/21/2014, 1:58 PM  [place] [address] [place], [place], [address]  Phone: [phone]   Fax:  [phone]

## 2014-10-23 ENCOUNTER — Encounter: Payer: Self-pay | Admitting: *Deleted

## 2014-10-23 ENCOUNTER — Encounter (HOSPITAL_BASED_OUTPATIENT_CLINIC_OR_DEPARTMENT_OTHER): Payer: Medicare Other

## 2014-10-23 ENCOUNTER — Other Ambulatory Visit (HOSPITAL_COMMUNITY): Payer: Self-pay | Admitting: Oncology

## 2014-10-23 ENCOUNTER — Encounter (HOSPITAL_BASED_OUTPATIENT_CLINIC_OR_DEPARTMENT_OTHER): Payer: Medicare Other | Admitting: Oncology

## 2014-10-23 DIAGNOSIS — D46C Myelodysplastic syndrome with isolated del(5q) chromosomal abnormality: Secondary | ICD-10-CM

## 2014-10-23 LAB — FERRITIN: FERRITIN: 2726 ng/mL — AB (ref 22–322)

## 2014-10-23 MED ORDER — ACETAMINOPHEN 325 MG PO TABS
650.0000 mg | ORAL_TABLET | Freq: Once | ORAL | Status: AC
  Administered 2014-10-23: 650 mg via ORAL

## 2014-10-23 MED ORDER — SODIUM CHLORIDE 0.9 % IJ SOLN
10.0000 mL | INTRAMUSCULAR | Status: AC | PRN
  Administered 2014-10-23: 10 mL

## 2014-10-23 MED ORDER — SODIUM CHLORIDE 0.9 % IV SOLN
250.0000 mL | Freq: Once | INTRAVENOUS | Status: AC
  Administered 2014-10-23: 250 mL via INTRAVENOUS

## 2014-10-23 MED ORDER — ACETAMINOPHEN 325 MG PO TABS
ORAL_TABLET | ORAL | Status: AC
  Filled 2014-10-23: qty 2

## 2014-10-23 MED ORDER — DIPHENHYDRAMINE HCL 25 MG PO CAPS
25.0000 mg | ORAL_CAPSULE | Freq: Once | ORAL | Status: AC
  Administered 2014-10-23: 25 mg via ORAL

## 2014-10-23 MED ORDER — DIPHENHYDRAMINE HCL 25 MG PO CAPS
ORAL_CAPSULE | ORAL | Status: AC
  Filled 2014-10-23: qty 1

## 2014-10-23 NOTE — Assessment & Plan Note (Signed)
Secondary to MDS

## 2014-10-23 NOTE — Patient Instructions (Signed)
[place] at [facility]  Discharge Instructions:  Continue weekly labs. Blood transfusions as needed. Return in 8 weeks for follow-up _______________________________________________________________  Thank you for choosing [place] at [facility] to provide your oncology and hematology care.  To afford each patient quality time with our providers, please arrive at least 15 minutes before your scheduled appointment.  You need to re-schedule your appointment if you arrive 10 or more minutes late.  We strive to give you quality time with our providers, and arriving late affects you and other patients whose appointments are after yours.  Also, if you no show three or more times for appointments you may be dismissed from the clinic.  Again, thank you for choosing [place] at [facility] hope is that these requests will allow you access to exceptional care and in a timely manner. _______________________________________________________________  If you have questions after your visit, please contact our office at [phone] between the hours of 8:30 a.m. and 5:00 p.m. Voicemails left after 4:30 p.m. will not be returned until the following business day. _______________________________________________________________  For prescription refill requests, have your pharmacy contact our office. _______________________________________________________________  Recommendations made by the consultant and any test results will be sent to your referring physician. _______________________________________________________________

## 2014-10-23 NOTE — Progress Notes (Signed)
-  Rescheduled-  KEFALAS,THOMAS  

## 2014-10-23 NOTE — Progress Notes (Signed)
[place] Clinical Social Work  Clinical Social Work was referred by [place] rounding.  Clinical Social Worker met with patient at [facility] to offer support and assess for needs.  CSW has worked with pt in the past around grief and loss issues. Pt shared he was doing well and continues to adjust to life without his wife. He appears able to discuss and talk more openingly about her. He seems to be progressing in his grief and is able to move forward. He denies other concerns and agrees to reach out to CSW as needed.   Clinical Social Work interventions: Emotional support   Loren Racer, [place] Tuesdays 8:30-1pm Wednesdays 8:30-12pm  [phone]

## 2014-10-23 NOTE — Assessment & Plan Note (Addendum)
5Q- MDS, Revlimid on hold due to progressive renal failure and co-morbidities. Now transfusion-dependent needing transfusions every 1-2 weeks.  Labs today as planned with the addition of a ferritin to evaluate for iron overload.  Lab every week: CBC and ferritin every 4 weeks. Will transfuse as needed to maintain Hgb of 9 g/dL or greater due to cardiac history.

## 2014-10-23 NOTE — Patient Instructions (Signed)
[place] at [facility] Discharge Instructions  RECOMMENDATIONS MADE BY THE CONSULTANT AND ANY TEST RESULTS WILL BE SENT TO YOUR REFERRING PHYSICIAN.  Today you received 2 units red blood cell transfusion. Return as scheduled for follow up.  Thank you for choosing [place] at [facility] to provide your oncology and hematology care.  To afford each patient quality time with our provider, please arrive at least 15 minutes before your scheduled appointment time.    You need to re-schedule your appointment should you arrive 10 or more minutes late.  We strive to give you quality time with our providers, and arriving late affects you and other patients whose appointments are after yours.  Also, if you no show three or more times for appointments you may be dismissed from the clinic at the providers discretion.     Again, thank you for choosing [facility].  Our hope is that these requests will decrease the amount of time that you wait before being seen by our physicians.       _____________________________________________________________  Should you have questions after your visit to [facility], please contact our office at [phone] between the hours of 8:30 a.m. and 4:30 p.m.  Voicemails left after 4:30 p.m. will not be returned until the following business day.  For prescription refill requests, have your pharmacy contact our office.

## 2014-10-23 NOTE — Progress Notes (Signed)
Tolerated 1st unit prbc transfusion without incidence. Tolerated 2nd unit prbc transfusion without incidence.

## 2014-10-23 NOTE — Assessment & Plan Note (Signed)
5Q- MDS, Revlimid on hold due to progressive renal failure and co-morbidities. Now transfusion-dependent needing transfusions every 1-2 weeks. Labs today as planned with the addition of a ferritin to evaluate for iron overload. Lab every week: CBC and ferritin every 4 weeks. Will transfuse as needed to maintain Hgb of 9 g/dL or greater due to cardiac history.

## 2014-10-23 NOTE — Progress Notes (Signed)
Caleb Bogus, MD [address] [place] [address]  Myelodysplastic syndrome with 5 q minus  CURRENT THERAPY: Observation with support of Hgb via blood transfusions to maintain a Hgb of 9 g/dL or greater due to cardiac history. Revlimid on hold due to worsening renal function  INTERVAL HISTORY: [name] 81 y.o. male returns for followup of 5Q- MDS, with Revlimid on hold due to progressively worse renal function on 03/07/2014.    Myelodysplastic syndrome with 5 q minus   09/05/2012 Bone Marrow Biopsy 5 q- MDS   09/19/2012 - 12/15/2012 Chemotherapy Approximate start date of Revlimid 5 mg 21 days on and 7 days off   12/16/2012 - 07/18/2013 Chemotherapy Dose increased to 10 mg Revlimid 21 days on and 7 days off   07/19/2013 - 03/07/2014 Chemotherapy Dose increased to 10 mg daily and then altered a number of times due to hospitalizations.   03/07/2014 Adverse Reaction Worsening renal function   03/07/2014 Treatment Plan Change Revlimid discontinued    I personally reviewed and went over laboratory results with the patient.  The results are noted within this dictation.  He is doing well. We spoke about his wife and the grieving of the loss of a loved one.  He is doing well from that standpoint.   Hematologically, he denies any complaints and ROS questioning is negative.   Past Medical History  Diagnosis Date  . Essential hypertension, benign   . COPD (chronic obstructive pulmonary disease)   . Coronary atherosclerosis of native coronary artery     Multivessel status post CABG 1996  . Arthritis   . Atrial fibrillation     Coumadin stopped 12/2011 due to anemia  . Dysphagia   . Anemia     Requiring blood transfusions  . Carotid stenosis     Dr Scot Dock   . Diabetes mellitus, type 2   . Mixed hyperlipidemia     Statin intolerant  . Hematuria     Felt related to Foley insertion  . Cardiomyopathy     LVEF 45-50%  . Melena     EGD & Colonoscopy  09/2011 revealed gastritis, erosions, scars, diverticula, 8 colon polyps (largest 1.6cm, not removed 2/2 anticoagulation), small AVM in transverse colon  . History of tobacco abuse     Quit 1992  . Anxiety   . GERD (gastroesophageal reflux disease)   . Myocardial infarction   . Thrombocytopenia   . Myelodysplastic syndrome with 5 q minus 10/17/2012    On Revlimid 5 mg daily 21 days on and 7 days off  . Aortic stenosis     Mild  . CKD (chronic kidney disease) stage 3, GFR 30-59 ml/min   . Pneumonia 11/2013    history of  . Blood transfusion without reported diagnosis     has DYSLIPIDEMIA; Coronary atherosclerosis; Atrial fibrillation; Peripheral vascular disease; OSTEOARTHRITIS; CAROTID ARTERY STENOSIS; Long term current use of anticoagulant; GI bleeding; AVM (arteriovenous malformation) of colon without hemorrhage; Angina effort; Anemia; History of colonic polyps; Myelodysplastic syndrome with 5 q minus; High output heart failure; Non-ST elevation MI (NSTEMI); Acute on chronic systolic heart failure; Other pancytopenia; HCAP (healthcare-associated pneumonia); Community acquired pneumonia; NSTEMI (non-ST elevated myocardial infarction); Chronic kidney disease (CKD) stage G3b/A2, moderately decreased glomerular filtration rate (GFR) between 30-44 mL/min/1.73 square meter and albuminuria creatinine ratio between 30-299 mg/g; Diabetes; CHF (congestive heart failure); Exertional dyspnea; Chest pain of uncertain etiology; Chest pain; Weakness of both legs; Poor  balance; Difficulty in walking(719.7); Acute pulmonary edema; Acute on chronic diastolic congestive heart failure; Carotid stenosis; Coronary artery disease involving coronary bypass graft of native heart with other forms of angina pectoris; and Aortic stenosis on his problem list.     is allergic to statins and tape.  [name] does not currently have medications on file.  Past Surgical History  Procedure Laterality Date  . Knee surgery   1960    Right knee cartilage  . Rotator cuff repair  1990    right   . Inguinal hernia repair  2000 and 2010    left  . Cholecystectomy  05/2010    Lap chole with biologic mesh repair/reinforcement by  Dr Rise Patience  . Back surgery  02/2006    ruptured disk,  post op diskitis infection requiring prolonged hospital stay and  surgical I & D  . Tonsillectomy    . Esophagogastroduodenoscopy  10/08/2011    Procedure: ESOPHAGOGASTRODUODENOSCOPY (EGD);  Surgeon: [name], MD;  [facility];  Service: Endoscopy;  Laterality: N/A;  . Colonoscopy  10/08/2011    Procedure: COLONOSCOPY;  Surgeon: [name], MD;  [facility];  Service: Endoscopy;  Laterality: N/A;  . Cataract extraction, bilateral    . Peg placement  07/2006    placed due to prolonged infection, poor po intake, malnutrition during several week hospital stay resulting from ruptured disc that became infected following surgery  . Colonoscopy  01/14/2012    Procedure: COLONOSCOPY;  Surgeon: [name], MD;  [facility];  Service: Endoscopy;  Laterality: N/A;  945  . Coronary artery bypass graft  1996    CABG x 4  . Ventral hernia repair    . Colonoscopy  04/20/2012    Procedure: COLONOSCOPY;  Surgeon: [name], MD;  [facility];  Service: Endoscopy;  Laterality: N/A;  1030  . Pr vein bypass graft,aorto-fem-pop  1996  . Spine surgery    . Joint replacement  1960    Right Knee  . Lymph node biopsy  08/15/2012    Procedure: LYMPH NODE BIOPSY;  Surgeon: Scherry Ran, MD;  Location: [facility];  Service: General;  Laterality: Left;  Cervical Lymph Node Bx in Minor Room  . Bone marrow biopsy    . Bone marrow aspiration      Denies any headaches, dizziness, double vision, fevers, chills, night sweats, nausea, vomiting, diarrhea, constipation, chest pain, heart palpitations, shortness of breath, blood in stool, black tarry stool, urinary pain, urinary burning, urinary  frequency, hematuria.   PHYSICAL EXAMINATION  ECOG PERFORMANCE STATUS: 1 - Symptomatic but completely ambulatory  There were no vitals filed for this visit.  GENERAL:alert, no distress, well nourished, well developed, comfortable, cooperative and smiling SKIN: skin color, texture, turgor are normal, no rashes or significant lesions HEAD: Normocephalic, No masses, lesions, tenderness or abnormalities EYES: normal, PERRLA, EOMI, Conjunctiva are pink and non-injected EARS: External ears normal OROPHARYNX:mucous membranes are moist  NECK: supple, thyroid normal size, non-tender, without nodularity, no stridor, non-tender, trachea midline LYMPH:  no palpable lymphadenopathy BREAST:not examined LUNGS: clear to auscultation and percussion HEART: regular rate & rhythm, no murmurs, no gallops, S1 normal and S2 normal ABDOMEN:abdomen soft, non-tender and normal bowel sounds BACK: Back symmetric, no curvature. EXTREMITIES:less then 2 second capillary refill, no joint deformities, effusion, or inflammation, no skin discoloration, no cyanosis  NEURO: alert & oriented x 3 with fluent speech, no focal motor/sensory deficits, gait normal  LABORATORY DATA: CBC    Component Value Date/Time   WBC 3.0* 10/21/2014 0854   RBC 2.71* 10/21/2014 0854   RBC 3.20* 03/25/2014 0852   HGB 8.6* 10/21/2014 0854   HCT 25.3* 10/21/2014 0854   PLT 88* 10/21/2014 0854   MCV 93.4 10/21/2014 0854   MCH 31.7 10/21/2014 0854   MCHC 34.0 10/21/2014 0854   RDW 17.3* 10/21/2014 0854   LYMPHSABS 0.8 10/21/2014 0854   MONOABS 0.2 10/21/2014 0854   EOSABS 0.2 10/21/2014 0854   BASOSABS 0.0 10/21/2014 0854      Chemistry      Component Value Date/Time   NA 139 10/21/2014 0854   K 3.8 10/21/2014 0854   CL 103 10/21/2014 0854   CO2 28 10/21/2014 0854   BUN 25* 10/21/2014 0854   CREATININE 1.30 10/21/2014 0854      Component Value Date/Time   CALCIUM 9.6 10/21/2014 0854   CALCIUM 8.5 12/02/2013 0608    ALKPHOS 74 10/21/2014 0854   AST 27 10/21/2014 0854   ALT 27 10/21/2014 0854   BILITOT 1.0 10/21/2014 0854      ASSESSMENT AND PLAN:  Myelodysplastic syndrome with 5 q minus 5Q- MDS, Revlimid on hold due to progressive renal failure and co-morbidities. Now transfusion-dependent needing transfusions every 1-2 weeks. Labs today as planned with the addition of a ferritin to evaluate for iron overload. Lab every week: CBC and ferritin every 4 weeks. Will transfuse as needed to maintain Hgb of 9 g/dL or greater due to cardiac history.   THERAPY PLAN:  We will continue to monitor labs and provide transfusions for Hgb below 9 g/dL.  All questions were answered. The patient knows to call the clinic with any problems, questions or concerns. We can certainly see the patient much sooner if necessary.  Patient and plan discussed with Dr. Ancil Linsey and she is in agreement with the aforementioned.   Taylie Helder 10/23/2014

## 2014-10-24 ENCOUNTER — Other Ambulatory Visit (HOSPITAL_COMMUNITY): Payer: Self-pay

## 2014-10-24 ENCOUNTER — Ambulatory Visit (HOSPITAL_COMMUNITY): Payer: Medicare Other | Admitting: Oncology

## 2014-10-24 LAB — TYPE AND SCREEN
ABO/RH(D): A NEG
Antibody Screen: NEGATIVE
UNIT DIVISION: 0
Unit division: 0

## 2014-10-28 ENCOUNTER — Encounter (HOSPITAL_COMMUNITY): Payer: Medicare Other | Attending: Oncology

## 2014-10-28 DIAGNOSIS — D46C Myelodysplastic syndrome with isolated del(5q) chromosomal abnormality: Secondary | ICD-10-CM | POA: Diagnosis present

## 2014-10-28 LAB — CBC
HEMATOCRIT: 28.4 % — AB (ref 39.0–52.0)
Hemoglobin: 9.5 g/dL — ABNORMAL LOW (ref 13.0–17.0)
MCH: 30.9 pg (ref 26.0–34.0)
MCHC: 33.5 g/dL (ref 30.0–36.0)
MCV: 92.5 fL (ref 78.0–100.0)
Platelets: 93 10*3/uL — ABNORMAL LOW (ref 150–400)
RBC: 3.07 MIL/uL — ABNORMAL LOW (ref 4.22–5.81)
RDW: 16.1 % — ABNORMAL HIGH (ref 11.5–15.5)
WBC: 3.5 10*3/uL — ABNORMAL LOW (ref 4.0–10.5)

## 2014-10-28 NOTE — Progress Notes (Signed)
LABS FOR CBC

## 2014-10-29 ENCOUNTER — Ambulatory Visit (HOSPITAL_COMMUNITY)
Admission: RE | Admit: 2014-10-29 | Discharge: 2014-10-29 | Disposition: A | Discharge status: Home / Self Care | Payer: Medicare Other | Source: Ambulatory Visit | Attending: Oncology | Admitting: Oncology

## 2014-10-29 DIAGNOSIS — I10 Essential (primary) hypertension: Secondary | ICD-10-CM | POA: Diagnosis not present

## 2014-10-29 DIAGNOSIS — Z9181 History of falling: Secondary | ICD-10-CM | POA: Insufficient documentation

## 2014-10-29 DIAGNOSIS — R262 Difficulty in walking, not elsewhere classified: Secondary | ICD-10-CM | POA: Insufficient documentation

## 2014-10-29 DIAGNOSIS — Z5189 Encounter for other specified aftercare: Secondary | ICD-10-CM | POA: Insufficient documentation

## 2014-10-29 DIAGNOSIS — Z951 Presence of aortocoronary bypass graft: Secondary | ICD-10-CM | POA: Diagnosis not present

## 2014-10-29 DIAGNOSIS — J449 Chronic obstructive pulmonary disease, unspecified: Secondary | ICD-10-CM | POA: Insufficient documentation

## 2014-10-29 DIAGNOSIS — R2689 Other abnormalities of gait and mobility: Secondary | ICD-10-CM

## 2014-10-29 DIAGNOSIS — R29898 Other symptoms and signs involving the musculoskeletal system: Secondary | ICD-10-CM

## 2014-10-29 NOTE — Therapy (Signed)
[place], [place], [address] Phone: [phone]   Fax:  [phone]  Physical Therapy Treatment  Patient Details  Name: [name] MRN: [id number] Date of Birth: 1931/12/23 Referring Provider:  Baird Cancer, PA-C  Encounter Date: 10/29/2014      PT End of Session - 10/29/14 1417    Visit Number 39   Number of Visits 45   Date for PT Re-Evaluation 11/15/14   Authorization Type medicare   Authorization - Visit Number 12   Authorization - Number of Visits 40   PT Start Time 7654   PT Stop Time 1405   PT Time Calculation (min) 57 min   Activity Tolerance Patient tolerated treatment well   Behavior During Therapy [facility] for tasks assessed/performed      Past Medical History  Diagnosis Date  . Essential hypertension, benign   . COPD (chronic obstructive pulmonary disease)   . Coronary atherosclerosis of native coronary artery     Multivessel status post CABG 1996  . Arthritis   . Atrial fibrillation     Coumadin stopped 12/2011 due to anemia  . Dysphagia   . Anemia     Requiring blood transfusions  . Carotid stenosis     Dr Scot Dock   . Diabetes mellitus, type 2   . Mixed hyperlipidemia     Statin intolerant  . Hematuria     Felt related to Foley insertion  . Cardiomyopathy     LVEF 45-50%  . Melena     EGD & Colonoscopy 09/2011 revealed gastritis, erosions, scars, diverticula, 8 colon polyps (largest 1.6cm, not removed 2/2 anticoagulation), small AVM in transverse colon  . History of tobacco abuse     Quit 1992  . Anxiety   . GERD (gastroesophageal reflux disease)   . Myocardial infarction   . Thrombocytopenia   . Myelodysplastic syndrome with 5 q minus 10/17/2012    On Revlimid 5 mg daily 21 days on and 7 days off  . Aortic stenosis     Mild  . CKD (chronic kidney disease) stage 3, GFR 30-59 ml/min   . Pneumonia 11/2013    history of  . Blood transfusion without reported diagnosis      Past Surgical History  Procedure Laterality Date  . Knee surgery  1960    Right knee cartilage  . Rotator cuff repair  1990    right   . Inguinal hernia repair  2000 and 2010    left  . Cholecystectomy  05/2010    Lap chole with biologic mesh repair/reinforcement by  Dr Rise Patience  . Back surgery  02/2006    ruptured disk,  post op diskitis infection requiring prolonged hospital stay and  surgical I & D  . Tonsillectomy    . Esophagogastroduodenoscopy  10/08/2011    Procedure: ESOPHAGOGASTRODUODENOSCOPY (EGD);  Surgeon: [name], MD;  [facility];  Service: Endoscopy;  Laterality: N/A;  . Colonoscopy  10/08/2011    Procedure: COLONOSCOPY;  Surgeon: [name], MD;  [facility];  Service: Endoscopy;  Laterality: N/A;  . Cataract extraction, bilateral    . Peg placement  07/2006    placed due to prolonged infection, poor po intake, malnutrition during several week hospital stay resulting from ruptured disc that became infected following surgery  . Colonoscopy  01/14/2012    Procedure: COLONOSCOPY;  Surgeon: [name], MD;  [facility];  Service: Endoscopy;  Laterality: N/A;  945  . Coronary artery bypass graft  1996    CABG x 4  . Ventral hernia repair    . Colonoscopy  04/20/2012    Procedure: COLONOSCOPY;  Surgeon: [name], MD;  [facility];  Service: Endoscopy;  Laterality: N/A;  1030  . Pr vein bypass graft,aorto-fem-pop  1996  . Spine surgery    . Joint replacement  1960    Right Knee  . Lymph node biopsy  08/15/2012    Procedure: LYMPH NODE BIOPSY;  Surgeon: Scherry Ran, MD;  Location: [facility];  Service: General;  Laterality: Left;  Cervical Lymph Node Bx in Minor Room  . Bone marrow biopsy    . Bone marrow aspiration      There were no vitals taken for this visit.  Visit Diagnosis:  Weakness of both legs  Poor balance      Subjective Assessment - 10/29/14 1305    Symptoms Pt states he is  not having any pain today.  States getting in and out of bed is becoming easier for him.     Currently in Pain? No/denies            [facility] Adult PT Treatment/Exercise - 10/29/14 1415    Lumbar Exercises: Stretches   Passive Hamstring Stretch 2 reps;30 seconds   Passive Hamstring Stretch Limitations Gastroc Slantboard   Quad Stretch 2 reps;30 seconds   Quad Stretch Limitations PROM by therapist   Lumbar Exercises: Aerobic   Stationary Bike Nustep hills #3; Level5 x 10:00   Lumbar Exercises: Standing   Other Standing Lumbar Exercises retro on floor; marching on foam x 10 without HHA   Lumbar Exercises: Seated   Sit to Stand 10 reps   Sit to Stand Limitations no HHA 2X5 reps   Knee/Hip Exercises: Prone   Hamstring Curl 2 sets;10 reps   Hamstring Curl Limitations 5#   Hip Extension 2 sets;10 reps;Both   Hip Extension Limitations 1#   Other Prone Exercises POE x 2'                  PT Short Term Goals - 10/16/14 1019    PT SHORT TERM GOAL #1   Title Pt to be able to stand for 10 minutes without increased pain to be able to make a sandwich for a meal   Time 4   Period Weeks   Status Achieved   PT SHORT TERM GOAL #2   Title Pt to be able to walk for 15 minutes for better health habits   Time 4   Period Weeks   Status Achieved   PT SHORT TERM GOAL #3   Title Pt to walk with 20 degree forward flexion     Period Weeks   Status Achieved           PT Long Term Goals - 10/16/14 1019    PT LONG TERM GOAL #1   Title Pt to be able to stand for 20 minutes to socialize   Time 12   Period Weeks   Status On-going   PT LONG TERM GOAL #2   Title Pt to be comfortable walking inside with a cane   Baseline 08/19/14 : Pt reports use of cane 90% of the time in the home.    Time 12   Status On-going   PT LONG TERM GOAL #3   Title Patient to be able to walk with his walker for 30 minutes  at a time   Time 8   Period Weeks   Status Achieved   PT LONG TERM GOAL #4    Title forward bent posture to be decreased to only 10 degree forward bend     Time 8   Period Weeks   Status On-going   PT LONG TERM GOAL #5   Title PT to be able to SLS x 10 seconds to reduce risk of falling   Time 12   Period Weeks   Status On-going   Additional Long Term Goals   Additional Long Term Goals Yes   PT LONG TERM GOAL #6   Title Pt to be able to be I with bed mobility    Time 16   Period Weeks   Status New               Plan - 10/29/14 1418    Clinical Impression Statement Continued focus on increasing glute and hamstring strength with prone exericses and progressing dynamic balance. Completed gastroc stretch prior to aerobic activity as patient reported tightness in his calf muscles.  Pt requires active assist to decrease substitution with exericses due to weakness.  Passive quad stretch completed bilaterally with more tightness noted in Lt LE.     PT Next Visit Plan Progress balance activities and gait training with use of std cane or no AD as tolerated.         Problem List Patient Active Problem List   Diagnosis Date Noted  . Acute on chronic diastolic congestive heart failure   . Carotid stenosis   . Coronary artery disease involving coronary bypass graft of native heart with other forms of angina pectoris   . Aortic stenosis   . Acute pulmonary edema 09/10/2014  . Weakness of both legs 05/09/2014  . Poor balance 05/09/2014  . Difficulty in walking(719.7) 05/09/2014  . Chest pain 03/23/2014  . CHF (congestive heart failure) 02/12/2014  . Exertional dyspnea 02/12/2014  . Chest pain of uncertain etiology 93/81/0175  . Diabetes 01/01/2014  . Chronic kidney disease (CKD) stage G3b/A2, moderately decreased glomerular filtration rate (GFR) between 30-44 mL/min/1.73 square meter and albuminuria creatinine ratio between 30-299 mg/g 12/14/2013  . NSTEMI (non-ST elevated myocardial infarction) 11/28/2013  . HCAP (healthcare-associated pneumonia) 11/27/2013   . Community acquired pneumonia 11/27/2013  . Acute on chronic systolic heart failure 07/21/8526  . Other pancytopenia 01/08/2013  . Non-ST elevation MI (NSTEMI) 01/06/2013  . High output heart failure 10/25/2012  . Myelodysplastic syndrome with 5 q minus 10/17/2012  . History of colonic polyps 02/28/2012  . Anemia 01/10/2012  . Angina effort 01/06/2012  . AVM (arteriovenous malformation) of colon without hemorrhage 01/04/2012  . GI bleeding 10/09/2011  . Long term current use of anticoagulant 12/22/2010  . CAROTID ARTERY STENOSIS 10/16/2010  . DYSLIPIDEMIA 04/11/2009  . Coronary atherosclerosis 04/11/2009  . Atrial fibrillation 04/11/2009  . Peripheral vascular disease 04/11/2009  . OSTEOARTHRITIS 04/11/2009    Teena Irani, PTA/CLT [phone] 10/29/2014, 2:23 PM  [facility] [address] [place], [place], [address] Phone: [phone]   Fax:  [phone]

## 2014-10-30 ENCOUNTER — Other Ambulatory Visit: Payer: Self-pay | Admitting: Hematology & Oncology

## 2014-10-30 ENCOUNTER — Encounter: Payer: Self-pay | Admitting: Hematology & Oncology

## 2014-10-31 ENCOUNTER — Ambulatory Visit (HOSPITAL_COMMUNITY)
Admission: RE | Admit: 2014-10-31 | Discharge: 2014-10-31 | Disposition: A | Discharge status: Home / Self Care | Payer: Medicare Other | Source: Ambulatory Visit | Attending: Oncology | Admitting: Oncology

## 2014-10-31 DIAGNOSIS — R2689 Other abnormalities of gait and mobility: Secondary | ICD-10-CM

## 2014-10-31 DIAGNOSIS — Z5189 Encounter for other specified aftercare: Secondary | ICD-10-CM | POA: Diagnosis not present

## 2014-10-31 DIAGNOSIS — R29898 Other symptoms and signs involving the musculoskeletal system: Secondary | ICD-10-CM

## 2014-10-31 NOTE — Therapy (Signed)
[facility] [address] [place], [place], [address] Phone: [phone]   Fax:  [phone]  Physical Therapy Treatment  Patient Details  Name: [name] MRN: [id number] Date of Birth: Mar 12, 1932 Referring Provider:  Ellouise Newer, PA-C  Encounter Date: 10/31/2014      PT End of Session - 10/31/14 1835    Visit Number 40   Number of Visits 45   Date for PT Re-Evaluation 11/15/14   Authorization Type medicare   Authorization - Visit Number 40   Authorization - Number of Visits 50   PT Start Time 1400   PT Stop Time 1445   PT Time Calculation (min) 45 min   PT Start Time (ACUTE ONLY) 1400   PT Stop Time (ACUTE ONLY) 1445   PT Time Calculation (min) (ACUTE ONLY) 45 min   Activity Tolerance Patient tolerated treatment well   Behavior During Therapy [facility] for tasks assessed/performed      Past Medical History  Diagnosis Date  . Essential hypertension, benign   . COPD (chronic obstructive pulmonary disease)   . Coronary atherosclerosis of native coronary artery     Multivessel status post CABG 1996  . Arthritis   . Atrial fibrillation     Coumadin stopped 12/2011 due to anemia  . Dysphagia   . Anemia     Requiring blood transfusions  . Carotid stenosis     Dr Edilia Bo   . Diabetes mellitus, type 2   . Mixed hyperlipidemia     Statin intolerant  . Hematuria     Felt related to Foley insertion  . Cardiomyopathy     LVEF 45-50%  . Melena     EGD & Colonoscopy 09/2011 revealed gastritis, erosions, scars, diverticula, 8 colon polyps (largest 1.6cm, not removed 2/2 anticoagulation), small AVM in transverse colon  . History of tobacco abuse     Quit 1992  . Anxiety   . GERD (gastroesophageal reflux disease)   . Myocardial infarction   . Thrombocytopenia   . Myelodysplastic syndrome with 5 q minus 10/17/2012    On Revlimid 5 mg daily 21 days on and 7 days off  . Aortic stenosis     Mild  . CKD (chronic kidney disease)  stage 3, GFR 30-59 ml/min   . Pneumonia 11/2013    history of  . Blood transfusion without reported diagnosis     Past Surgical History  Procedure Laterality Date  . Knee surgery  1960    Right knee cartilage  . Rotator cuff repair  1990    right   . Inguinal hernia repair  2000 and 2010    left  . Cholecystectomy  05/2010    Lap chole with biologic mesh repair/reinforcement by  Dr Zachery Dakins  . Back surgery  02/2006    ruptured disk,  post op diskitis infection requiring prolonged hospital stay and  surgical I & D  . Tonsillectomy    . Esophagogastroduodenoscopy  10/08/2011    Procedure: ESOPHAGOGASTRODUODENOSCOPY (EGD);  Surgeon: Malissa Hippo, MD;  [facility];  Service: Endoscopy;  Laterality: N/A;  . Colonoscopy  10/08/2011    Procedure: COLONOSCOPY;  Surgeon: Malissa Hippo, MD;  [facility];  Service: Endoscopy;  Laterality: N/A;  . Cataract extraction, bilateral    . Peg placement  07/2006    placed due to prolonged infection, poor po intake, malnutrition during several week hospital stay resulting from ruptured disc that became infected  following surgery  . Colonoscopy  01/14/2012    Procedure: COLONOSCOPY;  Surgeon: Malissa Hippo, MD;  [facility];  Service: Endoscopy;  Laterality: N/A;  945  . Coronary artery bypass graft  1996    CABG x 4  . Ventral hernia repair    . Colonoscopy  04/20/2012    Procedure: COLONOSCOPY;  Surgeon: Malissa Hippo, MD;  [facility];  Service: Endoscopy;  Laterality: N/A;  1030  . Pr vein bypass graft,aorto-fem-pop  1996  . Spine surgery    . Joint replacement  1960    Right Knee  . Lymph node biopsy  08/15/2012    Procedure: LYMPH NODE BIOPSY;  Surgeon: Marlane Hatcher, MD;  Location: [facility];  Service: General;  Laterality: Left;  Cervical Lymph Node Bx in Minor Room  . Bone marrow biopsy    . Bone marrow aspiration      There were no vitals taken for this visit.  Visit Diagnosis:   Weakness of both legs  Poor balance      Subjective Assessment - 10/31/14 1830    Symptoms Pt states he is feeling stronger today and has been compliant with HEP.  Doing SLS at kitchen sink.   Currently in Pain? No/denies             [facility] Adult PT Treatment/Exercise - 10/31/14 1435    Lumbar Exercises: Stretches   Passive Hamstring Stretch 2 reps;30 seconds   Passive Hamstring Stretch Limitations Gastroc Slantboard   Lumbar Exercises: Aerobic   Stationary Bike Nustep hills #3; Level5 x 10:00   Lumbar Exercises: Standing   Other Standing Lumbar Exercises obstacle couse with varried hurdles and cones 2RT   Lumbar Exercises: Seated   Sit to Stand 10 reps   Sit to Stand Limitations no HHA 2X5 reps   Knee/Hip Exercises: Standing   Rocker Board Limitations bilaterally on foam max of 5"   SLS x 5 B    Other Standing Knee Exercises marching on foam 10 reps                  PT Short Term Goals - 10/16/14 1019    PT SHORT TERM GOAL #1   Title Pt to be able to stand for 10 minutes without increased pain to be able to make a sandwich for a meal   Time 4   Period Weeks   Status Achieved   PT SHORT TERM GOAL #2   Title Pt to be able to walk for 15 minutes for better health habits   Time 4   Period Weeks   Status Achieved   PT SHORT TERM GOAL #3   Title Pt to walk with 20 degree forward flexion     Period Weeks   Status Achieved           PT Long Term Goals - 10/16/14 1019    PT LONG TERM GOAL #1   Title Pt to be able to stand for 20 minutes to socialize   Time 12   Period Weeks   Status On-going   PT LONG TERM GOAL #2   Title Pt to be comfortable walking inside with a cane   Baseline 08/19/14 : Pt reports use of cane 90% of the time in the home.    Time 12   Status On-going   PT LONG TERM GOAL #3   Title Patient to be able to walk with his walker for 30  minutes at a time   Time 8   Period Weeks   Status Achieved   PT LONG TERM GOAL #4   Title  forward bent posture to be decreased to only 10 degree forward bend     Time 8   Period Weeks   Status On-going   PT LONG TERM GOAL #5   Title PT to be able to SLS x 10 seconds to reduce risk of falling   Time 12   Period Weeks   Status On-going   Additional Long Term Goals   Additional Long Term Goals Yes   PT LONG TERM GOAL #6   Title Pt to be able to be I with bed mobility    Time 16   Period Weeks   Status New               Plan - 25-Nov-2014 1841    Clinical Impression Statement Pt showed 15 minutes late for appointment today.  continued to focus on increasing overall stability and LE strength.  PT completed obstacle course with varrying heights wtih tendency to circumduct LE due to weakness. Added marching actvitiy on foam with max time of 5 seconds.     PT Next Visit Plan Continue to progress dynamic balance and indendence with gait.   Pt will need re-evaluation X 4 more visits.      Problem List Patient Active Problem List   Diagnosis Date Noted  . Acute on chronic diastolic congestive heart failure   . Carotid stenosis   . Coronary artery disease involving coronary bypass graft of native heart with other forms of angina pectoris   . Aortic stenosis   . Acute pulmonary edema 09/10/2014  . Weakness of both legs 05/09/2014  . Poor balance 05/09/2014  . Difficulty in walking(719.7) 05/09/2014  . Chest pain 03/23/2014  . CHF (congestive heart failure) 02/12/2014  . Exertional dyspnea 02/12/2014  . Chest pain of uncertain etiology 83/41/9622  . Diabetes 01/01/2014  . Chronic kidney disease (CKD) stage G3b/A2, moderately decreased glomerular filtration rate (GFR) between 30-44 mL/min/1.73 square meter and albuminuria creatinine ratio between 30-299 mg/g 12/14/2013  . NSTEMI (non-ST elevated myocardial infarction) 11/28/2013  . HCAP (healthcare-associated pneumonia) 11/27/2013  . Community acquired pneumonia 11/27/2013  . Acute on chronic systolic heart failure  29/79/8921  . Other pancytopenia 01/08/2013  . Non-ST elevation MI (NSTEMI) 01/06/2013  . High output heart failure 10/25/2012  . Myelodysplastic syndrome with 5 q minus 10/17/2012  . History of colonic polyps 02/28/2012  . Anemia 01/10/2012  . Angina effort 01/06/2012  . AVM (arteriovenous malformation) of colon without hemorrhage 01/04/2012  . GI bleeding 10/09/2011  . Long term current use of anticoagulant 12/22/2010  . CAROTID ARTERY STENOSIS 10/16/2010  . DYSLIPIDEMIA 04/11/2009  . Coronary atherosclerosis 04/11/2009  . Atrial fibrillation 04/11/2009  . Peripheral vascular disease 04/11/2009  . OSTEOARTHRITIS 04/11/2009    Teena Irani, PTA/CLT [phone] 11/25/14, 6:51 PM  [facility] [address] [place], [place], [address] Phone: [phone]   Fax:  [phone]       G-Codes - Nov 25, 2014 1850    Functional Assessment Tool Used clinical judgement   Functional Limitation Mobility: Walking and moving around   Mobility: Walking and Moving Around Current Status [phone]) At least 20 percent but less than 40 percent impaired, limited or restricted   Mobility: Walking and Moving Around Goal Status [phone]) At least 1 percent but less than 20 percent impaired, limited  or restricted     Devona Konig PT DPT [phone]

## 2014-11-04 ENCOUNTER — Other Ambulatory Visit (HOSPITAL_COMMUNITY): Payer: Self-pay | Admitting: Oncology

## 2014-11-04 ENCOUNTER — Encounter (HOSPITAL_COMMUNITY): Payer: Medicare Other | Attending: Hematology & Oncology

## 2014-11-04 ENCOUNTER — Ambulatory Visit (HOSPITAL_COMMUNITY)
Admission: RE | Admit: 2014-11-04 | Discharge: 2014-11-04 | Disposition: A | Discharge status: Home / Self Care | Payer: Medicare Other | Source: Ambulatory Visit | Attending: Oncology | Admitting: Oncology

## 2014-11-04 DIAGNOSIS — D46C Myelodysplastic syndrome with isolated del(5q) chromosomal abnormality: Secondary | ICD-10-CM | POA: Diagnosis present

## 2014-11-04 DIAGNOSIS — R2689 Other abnormalities of gait and mobility: Secondary | ICD-10-CM

## 2014-11-04 DIAGNOSIS — Z5189 Encounter for other specified aftercare: Secondary | ICD-10-CM | POA: Diagnosis not present

## 2014-11-04 DIAGNOSIS — R29898 Other symptoms and signs involving the musculoskeletal system: Secondary | ICD-10-CM | POA: Insufficient documentation

## 2014-11-04 LAB — CBC
HCT: 26.1 % — ABNORMAL LOW (ref 39.0–52.0)
Hemoglobin: 8.8 g/dL — ABNORMAL LOW (ref 13.0–17.0)
MCH: 31.2 pg (ref 26.0–34.0)
MCHC: 33.7 g/dL (ref 30.0–36.0)
MCV: 92.6 fL (ref 78.0–100.0)
Platelets: 96 10*3/uL — ABNORMAL LOW (ref 150–400)
RBC: 2.82 MIL/uL — ABNORMAL LOW (ref 4.22–5.81)
RDW: 16.2 % — ABNORMAL HIGH (ref 11.5–15.5)
WBC: 3.2 10*3/uL — AB (ref 4.0–10.5)

## 2014-11-04 LAB — PREPARE RBC (CROSSMATCH)

## 2014-11-04 NOTE — Addendum Note (Signed)
Addended by: Berneta Levins on: 11/04/2014 03:47 PM   Modules accepted: Orders

## 2014-11-04 NOTE — Therapy (Signed)
[place], [place], [address] Phone: [phone]   Fax:  [phone]  Physical Therapy Treatment  Patient Details  Name: [name] MRN: [id number] Date of Birth: 05-Aug-1932 Referring Provider:  Baird Cancer, PA-C  Encounter Date: 11/04/2014      PT End of Session - 11/04/14 1347    Visit Number 41   Number of Visits 45   Date for PT Re-Evaluation 11/15/14   Authorization - Visit Number 73   Authorization - Number of Visits 50   PT Start Time 3762   PT Stop Time 1355   PT Time Calculation (min) 50 min   Equipment Utilized During Treatment Gait belt      Past Medical History  Diagnosis Date  . Essential hypertension, benign   . COPD (chronic obstructive pulmonary disease)   . Coronary atherosclerosis of native coronary artery     Multivessel status post CABG 1996  . Arthritis   . Atrial fibrillation     Coumadin stopped 12/2011 due to anemia  . Dysphagia   . Anemia     Requiring blood transfusions  . Carotid stenosis     Dr Scot Dock   . Diabetes mellitus, type 2   . Mixed hyperlipidemia     Statin intolerant  . Hematuria     Felt related to Foley insertion  . Cardiomyopathy     LVEF 45-50%  . Melena     EGD & Colonoscopy 09/2011 revealed gastritis, erosions, scars, diverticula, 8 colon polyps (largest 1.6cm, not removed 2/2 anticoagulation), small AVM in transverse colon  . History of tobacco abuse     Quit 1992  . Anxiety   . GERD (gastroesophageal reflux disease)   . Myocardial infarction   . Thrombocytopenia   . Myelodysplastic syndrome with 5 q minus 10/17/2012    On Revlimid 5 mg daily 21 days on and 7 days off  . Aortic stenosis     Mild  . CKD (chronic kidney disease) stage 3, GFR 30-59 ml/min   . Pneumonia 11/2013    history of  . Blood transfusion without reported diagnosis     Past Surgical History  Procedure Laterality Date  . Knee surgery  1960    Right knee  cartilage  . Rotator cuff repair  1990    right   . Inguinal hernia repair  2000 and 2010    left  . Cholecystectomy  05/2010    Lap chole with biologic mesh repair/reinforcement by  Dr Rise Patience  . Back surgery  02/2006    ruptured disk,  post op diskitis infection requiring prolonged hospital stay and  surgical I & D  . Tonsillectomy    . Esophagogastroduodenoscopy  10/08/2011    Procedure: ESOPHAGOGASTRODUODENOSCOPY (EGD);  Surgeon: [name], MD;  [facility];  Service: Endoscopy;  Laterality: N/A;  . Colonoscopy  10/08/2011    Procedure: COLONOSCOPY;  Surgeon: [name], MD;  [facility];  Service: Endoscopy;  Laterality: N/A;  . Cataract extraction, bilateral    . Peg placement  07/2006    placed due to prolonged infection, poor po intake, malnutrition during several week hospital stay resulting from ruptured disc that became infected following surgery  . Colonoscopy  01/14/2012    Procedure: COLONOSCOPY;  Surgeon: [name], MD;  [facility]: [facility];  Service: Endoscopy;  Laterality: N/A;  945  . Coronary artery bypass graft  1996  CABG x 4  . Ventral hernia repair    . Colonoscopy  04/20/2012    Procedure: COLONOSCOPY;  Surgeon: [name], MD;  [facility]: [facility];  Service: Endoscopy;  Laterality: N/A;  1030  . Pr vein bypass graft,aorto-fem-pop  1996  . Spine surgery    . Joint replacement  1960    Right Knee  . Lymph node biopsy  08/15/2012    Procedure: LYMPH NODE BIOPSY;  Surgeon: Scherry Ran, MD;  Location: [facility];  Service: General;  Laterality: Left;  Cervical Lymph Node Bx in Minor Room  . Bone marrow biopsy    . Bone marrow aspiration      There were no vitals taken for this visit.  Visit Diagnosis:  Weakness of both legs  Poor balance      Subjective Assessment - 11/04/14 1331    Symptoms Pt states he has been off balanced this weekend.  He is suppose to get blood tomorrow. Pt states that he fell  when he got out of his recliner but fell back into his chair.              [facility] Adult PT Treatment/Exercise - 11/04/14 0001    Lumbar Exercises: Aerobic   Stationary Bike Nustep hills #3; Level5 x 15:00   Lumbar Exercises: Standing   Shoulder Adduction Limitations rockerboard forward and sideways x 2' each.    Other Standing Lumbar Exercises forward on balance beam with hurdles x 2 RT side step x 2 RT    Other Standing Lumbar Exercises stairs x 2 RT ; tandem stqance; SLS x 5 Bilaterally    Lumbar Exercises: Seated   Sit to Stand 10 reps            PT Short Term Goals - 10/16/14 1019    PT SHORT TERM GOAL #1   Title Pt to be able to stand for 10 minutes without increased pain to be able to make a sandwich for a meal   Time 4   Period Weeks   Status Achieved   PT SHORT TERM GOAL #2   Title Pt to be able to walk for 15 minutes for better health habits   Time 4   Period Weeks   Status Achieved   PT SHORT TERM GOAL #3   Title Pt to walk with 20 degree forward flexion     Period Weeks   Status Achieved           PT Long Term Goals - 10/16/14 1019    PT LONG TERM GOAL #1   Title Pt to be able to stand for 20 minutes to socialize   Time 12   Period Weeks   Status On-going   PT LONG TERM GOAL #2   Title Pt to be comfortable walking inside with a cane   Baseline 08/19/14 : Pt reports use of cane 90% of the time in the home.    Time 12   Status On-going   PT LONG TERM GOAL #3   Title Patient to be able to walk with his walker for 30 minutes at a time   Time 8   Period Weeks   Status Achieved   PT LONG TERM GOAL #4   Title forward bent posture to be decreased to only 10 degree forward bend     Time 8   Period Weeks   Status On-going   PT LONG TERM GOAL #5   Title PT to  be able to SLS x 10 seconds to reduce risk of falling   Time 12   Period Weeks   Status On-going   Additional Long Term Goals   Additional Long Term Goals Yes   PT LONG TERM GOAL #6    Title Pt to be able to be I with bed mobility    Time 16   Period Weeks   Status New               Plan - 11/04/14 1348    Clinical Impression Statement Pt needed to take two short rest breaks states that he can tell that he needs blood.  Pt had difficulty with rockerboard and foam activity needing supervision assist.    PT Next Visit Plan Encourage silver sneakers to pt Re-evaluate at end of this week         Problem List Patient Active Problem List   Diagnosis Date Noted  . Acute on chronic diastolic congestive heart failure   . Carotid stenosis   . Coronary artery disease involving coronary bypass graft of native heart with other forms of angina pectoris   . Aortic stenosis   . Acute pulmonary edema 09/10/2014  . Weakness of both legs 05/09/2014  . Poor balance 05/09/2014  . Difficulty in walking(719.7) 05/09/2014  . Chest pain 03/23/2014  . CHF (congestive heart failure) 02/12/2014  . Exertional dyspnea 02/12/2014  . Chest pain of uncertain etiology 57/97/2820  . Diabetes 01/01/2014  . Chronic kidney disease (CKD) stage G3b/A2, moderately decreased glomerular filtration rate (GFR) between 30-44 mL/min/1.73 square meter and albuminuria creatinine ratio between 30-299 mg/g 12/14/2013  . NSTEMI (non-ST elevated myocardial infarction) 11/28/2013  . HCAP (healthcare-associated pneumonia) 11/27/2013  . Community acquired pneumonia 11/27/2013  . Acute on chronic systolic heart failure 60/15/6153  . Other pancytopenia 01/08/2013  . Non-ST elevation MI (NSTEMI) 01/06/2013  . High output heart failure 10/25/2012  . Myelodysplastic syndrome with 5 q minus 10/17/2012  . History of colonic polyps 02/28/2012  . Anemia 01/10/2012  . Angina effort 01/06/2012  . AVM (arteriovenous malformation) of colon without hemorrhage 01/04/2012  . GI bleeding 10/09/2011  . Long term current use of anticoagulant 12/22/2010  . CAROTID ARTERY STENOSIS 10/16/2010  . DYSLIPIDEMIA 04/11/2009   . Coronary atherosclerosis 04/11/2009  . Atrial fibrillation 04/11/2009  . Peripheral vascular disease 04/11/2009  . OSTEOARTHRITIS 04/11/2009    RUSSELL,CINDY PT 11/04/2014, 1:50 PM  [facility] [address] [place], [place], [address] Phone: [phone]   Fax:  [phone]

## 2014-11-04 NOTE — Progress Notes (Signed)
Labs for cbc.

## 2014-11-05 ENCOUNTER — Encounter (HOSPITAL_COMMUNITY): Payer: Self-pay

## 2014-11-05 ENCOUNTER — Encounter (HOSPITAL_BASED_OUTPATIENT_CLINIC_OR_DEPARTMENT_OTHER): Payer: Medicare Other

## 2014-11-05 DIAGNOSIS — D46C Myelodysplastic syndrome with isolated del(5q) chromosomal abnormality: Secondary | ICD-10-CM | POA: Diagnosis not present

## 2014-11-05 MED ORDER — ACETAMINOPHEN 325 MG PO TABS
650.0000 mg | ORAL_TABLET | Freq: Once | ORAL | Status: AC
  Administered 2014-11-05: 650 mg via ORAL
  Filled 2014-11-05: qty 2

## 2014-11-05 MED ORDER — SODIUM CHLORIDE 0.9 % IJ SOLN: 10.0000 mL | INTRAMUSCULAR | Status: DC | PRN

## 2014-11-05 MED ORDER — SODIUM CHLORIDE 0.9 % IV SOLN
250.0000 mL | Freq: Once | INTRAVENOUS | Status: AC
  Administered 2014-11-05: 250 mL via INTRAVENOUS

## 2014-11-05 MED ORDER — FUROSEMIDE 10 MG/ML IJ SOLN
40.0000 mg | Freq: Once | INTRAMUSCULAR | Status: AC
  Administered 2014-11-05: 40 mg via INTRAVENOUS
  Filled 2014-11-05: qty 4

## 2014-11-05 MED ORDER — DIPHENHYDRAMINE HCL 25 MG PO CAPS
25.0000 mg | ORAL_CAPSULE | Freq: Once | ORAL | Status: AC
  Administered 2014-11-05: 25 mg via ORAL
  Filled 2014-11-05: qty 1

## 2014-11-05 NOTE — Progress Notes (Signed)
[name] Tolerated one unit of blood today.

## 2014-11-05 NOTE — Patient Instructions (Signed)
[place] at [facility]  Discharge Instructions: You had 1 unit of blood.  Follow up as scheduled.  Call the clinic if you have any questions or concerns _______________________________________________________________  Thank you for choosing [place] at [facility] to provide your oncology and hematology care.  To afford each patient quality time with our providers, please arrive at least 15 minutes before your scheduled appointment.  You need to re-schedule your appointment if you arrive 10 or more minutes late.  We strive to give you quality time with our providers, and arriving late affects you and other patients whose appointments are after yours.  Also, if you no show three or more times for appointments you may be dismissed from the clinic.  Again, thank you for choosing [place] [facility] hope is that these requests will allow you access to exceptional care and in a timely manner. _______________________________________________________________  If you have questions after your visit, please contact our office at [phone] between the hours of 8:30 a.m. and 5:00 p.m. Voicemails left after 4:30 p.m. will not be returned until the following business day. _______________________________________________________________  For prescription refill requests, have your pharmacy contact our office. _______________________________________________________________  Recommendations made by the consultant and any test results will be sent to your referring physician. _______________________________________________________________

## 2014-11-06 LAB — TYPE AND SCREEN
ABO/RH(D): A NEG
Antibody Screen: NEGATIVE
UNIT DIVISION: 0

## 2014-11-07 ENCOUNTER — Ambulatory Visit (HOSPITAL_COMMUNITY)
Admission: RE | Admit: 2014-11-07 | Discharge: 2014-11-07 | Disposition: A | Discharge status: Home / Self Care | Payer: Medicare Other | Source: Ambulatory Visit | Attending: Oncology | Admitting: Oncology

## 2014-11-07 DIAGNOSIS — Z5189 Encounter for other specified aftercare: Secondary | ICD-10-CM | POA: Diagnosis not present

## 2014-11-07 DIAGNOSIS — R2689 Other abnormalities of gait and mobility: Secondary | ICD-10-CM

## 2014-11-07 DIAGNOSIS — R29898 Other symptoms and signs involving the musculoskeletal system: Secondary | ICD-10-CM

## 2014-11-07 NOTE — Therapy (Signed)
[place], [place], [address] Phone: [phone]   Fax:  [phone]  Physical Therapy Treatment  Patient Details  Name: [name] MRN: [id number] Date of Birth: 1932/05/22 Referring Provider:  Baird Cancer, PA-C  Encounter Date: 11/07/2014      PT End of Session - 11/07/14 1428    Visit Number 42   Number of Visits 45   Date for PT Re-Evaluation 11/15/14   Authorization Type medicare   Authorization - Visit Number 51   Authorization - Number of Visits 50   PT Start Time 4825   PT Stop Time 1440   PT Time Calculation (min) 52 min   Equipment Utilized During Treatment Gait belt      Past Medical History  Diagnosis Date  . Essential hypertension, benign   . COPD (chronic obstructive pulmonary disease)   . Coronary atherosclerosis of native coronary artery     Multivessel status post CABG 1996  . Arthritis   . Atrial fibrillation     Coumadin stopped 12/2011 due to anemia  . Dysphagia   . Anemia     Requiring blood transfusions  . Carotid stenosis     Dr Scot Dock   . Diabetes mellitus, type 2   . Mixed hyperlipidemia     Statin intolerant  . Hematuria     Felt related to Foley insertion  . Cardiomyopathy     LVEF 45-50%  . Melena     EGD & Colonoscopy 09/2011 revealed gastritis, erosions, scars, diverticula, 8 colon polyps (largest 1.6cm, not removed 2/2 anticoagulation), small AVM in transverse colon  . History of tobacco abuse     Quit 1992  . Anxiety   . GERD (gastroesophageal reflux disease)   . Myocardial infarction   . Thrombocytopenia   . Myelodysplastic syndrome with 5 q minus 10/17/2012    On Revlimid 5 mg daily 21 days on and 7 days off  . Aortic stenosis     Mild  . CKD (chronic kidney disease) stage 3, GFR 30-59 ml/min   . Pneumonia 11/2013    history of  . Blood transfusion without reported diagnosis     Past Surgical History  Procedure Laterality Date  . Knee  surgery  1960    Right knee cartilage  . Rotator cuff repair  1990    right   . Inguinal hernia repair  2000 and 2010    left  . Cholecystectomy  05/2010    Lap chole with biologic mesh repair/reinforcement by  Dr Rise Patience  . Back surgery  02/2006    ruptured disk,  post op diskitis infection requiring prolonged hospital stay and  surgical I & D  . Tonsillectomy    . Esophagogastroduodenoscopy  10/08/2011    Procedure: ESOPHAGOGASTRODUODENOSCOPY (EGD);  Surgeon: [name], MD;  [facility];  Service: Endoscopy;  Laterality: N/A;  . Colonoscopy  10/08/2011    Procedure: COLONOSCOPY;  Surgeon: [name], MD;  [facility];  Service: Endoscopy;  Laterality: N/A;  . Cataract extraction, bilateral    . Peg placement  07/2006    placed due to prolonged infection, poor po intake, malnutrition during several week hospital stay resulting from ruptured disc that became infected following surgery  . Colonoscopy  01/14/2012    Procedure: COLONOSCOPY;  Surgeon: [name], MD;  [facility];  Service: Endoscopy;  Laterality: N/A;  945  . Coronary artery  bypass graft  1996    CABG x 4  . Ventral hernia repair    . Colonoscopy  04/20/2012    Procedure: COLONOSCOPY;  Surgeon: [name], MD;  [facility];  Service: Endoscopy;  Laterality: N/A;  1030  . Pr vein bypass graft,aorto-fem-pop  1996  . Spine surgery    . Joint replacement  1960    Right Knee  . Lymph node biopsy  08/15/2012    Procedure: LYMPH NODE BIOPSY;  Surgeon: Scherry Ran, MD;  Location: [facility];  Service: General;  Laterality: Left;  Cervical Lymph Node Bx in Minor Room  . Bone marrow biopsy    . Bone marrow aspiration      There were no vitals taken for this visit.  Visit Diagnosis:  Weakness of both legs  Poor balance          [facility] Adult PT Treatment/Exercise - 11/07/14 1347    Lumbar Exercises: Aerobic   Stationary Bike Nustep hills #3; Level5 x  15:00   Lumbar Exercises: Standing   Other Standing Lumbar Exercises obstacle course:  balance beam, hurdles varied heights, cone weaving 3RT   Other Standing Lumbar Exercises stairs x 2 RT ; tandem stance; SLS x 5 Bilaterally    Lumbar Exercises: Seated   Sit to Stand 10 reps   Sit to Stand Limitations no HHA, normal chair height                  PT Short Term Goals - 10/16/14 1019    PT SHORT TERM GOAL #1   Title Pt to be able to stand for 10 minutes without increased pain to be able to make a sandwich for a meal   Time 4   Period Weeks   Status Achieved   PT SHORT TERM GOAL #2   Title Pt to be able to walk for 15 minutes for better health habits   Time 4   Period Weeks   Status Achieved   PT SHORT TERM GOAL #3   Title Pt to walk with 20 degree forward flexion     Period Weeks   Status Achieved           PT Long Term Goals - 10/16/14 1019    PT LONG TERM GOAL #1   Title Pt to be able to stand for 20 minutes to socialize   Time 12   Period Weeks   Status On-going   PT LONG TERM GOAL #2   Title Pt to be comfortable walking inside with a cane   Baseline 08/19/14 : Pt reports use of cane 90% of the time in the home.    Time 12   Status On-going   PT LONG TERM GOAL #3   Title Patient to be able to walk with his walker for 30 minutes at a time   Time 8   Period Weeks   Status Achieved   PT LONG TERM GOAL #4   Title forward bent posture to be decreased to only 10 degree forward bend     Time 8   Period Weeks   Status On-going   PT LONG TERM GOAL #5   Title PT to be able to SLS x 10 seconds to reduce risk of falling   Time 12   Period Weeks   Status On-going   Additional Long Term Goals   Additional Long Term Goals Yes   PT LONG TERM GOAL #6  Title Pt to be able to be I with bed mobility    Time 16   Period Weeks   Status New               Plan - 11/07/14 1428    Clinical Impression Statement Obstacle course set with balance beam, varied  hurdles (6" and 12"), and cone weaving.  Pt very fatigued after completing 1RT requiring rest break.  Most difficulty with clearing 12" hurdle.  Completed stairwell with inablitliy to complete reciprocally with 7" stairs, only 4" stairs using 1 HR.  Pt with improved posture with less postural fatigue with ambution and actvities.     PT Next Visit Plan Pt to be re-evaluated X 3 more visits.         Problem List Patient Active Problem List   Diagnosis Date Noted  . Acute on chronic diastolic congestive heart failure   . Carotid stenosis   . Coronary artery disease involving coronary bypass graft of native heart with other forms of angina pectoris   . Aortic stenosis   . Acute pulmonary edema 09/10/2014  . Weakness of both legs 05/09/2014  . Poor balance 05/09/2014  . Difficulty in walking(719.7) 05/09/2014  . Chest pain 03/23/2014  . CHF (congestive heart failure) 02/12/2014  . Exertional dyspnea 02/12/2014  . Chest pain of uncertain etiology 25/95/6387  . Diabetes 01/01/2014  . Chronic kidney disease (CKD) stage G3b/A2, moderately decreased glomerular filtration rate (GFR) between 30-44 mL/min/1.73 square meter and albuminuria creatinine ratio between 30-299 mg/g 12/14/2013  . NSTEMI (non-ST elevated myocardial infarction) 11/28/2013  . HCAP (healthcare-associated pneumonia) 11/27/2013  . Community acquired pneumonia 11/27/2013  . Acute on chronic systolic heart failure 56/43/3295  . Other pancytopenia 01/08/2013  . Non-ST elevation MI (NSTEMI) 01/06/2013  . High output heart failure 10/25/2012  . Myelodysplastic syndrome with 5 q minus 10/17/2012  . History of colonic polyps 02/28/2012  . Anemia 01/10/2012  . Angina effort 01/06/2012  . AVM (arteriovenous malformation) of colon without hemorrhage 01/04/2012  . GI bleeding 10/09/2011  . Long term current use of anticoagulant 12/22/2010  . CAROTID ARTERY STENOSIS 10/16/2010  . DYSLIPIDEMIA 04/11/2009  . Coronary atherosclerosis  04/11/2009  . Atrial fibrillation 04/11/2009  . Peripheral vascular disease 04/11/2009  . OSTEOARTHRITIS 04/11/2009   Teena Irani, PTA/CLT [phone] 11/07/2014, 2:35 PM  [place], [place], [address] Phone: [phone]   Fax:  [phone]

## 2014-11-11 ENCOUNTER — Encounter (HOSPITAL_COMMUNITY): Payer: Medicare Other | Admitting: Physical Therapy

## 2014-11-11 ENCOUNTER — Encounter (HOSPITAL_BASED_OUTPATIENT_CLINIC_OR_DEPARTMENT_OTHER): Payer: Medicare Other

## 2014-11-11 DIAGNOSIS — D46C Myelodysplastic syndrome with isolated del(5q) chromosomal abnormality: Secondary | ICD-10-CM | POA: Diagnosis not present

## 2014-11-11 LAB — CBC
HEMATOCRIT: 26.7 % — AB (ref 39.0–52.0)
HEMOGLOBIN: 9 g/dL — AB (ref 13.0–17.0)
MCH: 31.3 pg (ref 26.0–34.0)
MCHC: 33.7 g/dL (ref 30.0–36.0)
MCV: 92.7 fL (ref 78.0–100.0)
Platelets: 105 10*3/uL — ABNORMAL LOW (ref 150–400)
RBC: 2.88 MIL/uL — ABNORMAL LOW (ref 4.22–5.81)
RDW: 15.9 % — AB (ref 11.5–15.5)
WBC: 3.5 10*3/uL — ABNORMAL LOW (ref 4.0–10.5)

## 2014-11-11 NOTE — Progress Notes (Signed)
Labs for cbc

## 2014-11-12 ENCOUNTER — Ambulatory Visit (HOSPITAL_COMMUNITY)
Admission: RE | Admit: 2014-11-12 | Discharge: 2014-11-12 | Disposition: A | Discharge status: Home / Self Care | Payer: Medicare Other | Source: Ambulatory Visit | Attending: Pulmonary Disease | Admitting: Pulmonary Disease

## 2014-11-12 ENCOUNTER — Telehealth (HOSPITAL_COMMUNITY): Payer: Self-pay | Admitting: Emergency Medicine

## 2014-11-12 DIAGNOSIS — Z5189 Encounter for other specified aftercare: Secondary | ICD-10-CM | POA: Diagnosis not present

## 2014-11-12 DIAGNOSIS — R29898 Other symptoms and signs involving the musculoskeletal system: Secondary | ICD-10-CM

## 2014-11-12 DIAGNOSIS — R2689 Other abnormalities of gait and mobility: Secondary | ICD-10-CM

## 2014-11-12 NOTE — Telephone Encounter (Signed)
-----   Message from Baird Cancer, PA-C sent at 11/12/2014  8:15 AM EST ----- Good.  He will likely need transfusion next week.

## 2014-11-12 NOTE — Therapy (Signed)
[place], [place], [address] Phone: [phone]   Fax:  [phone]  Physical Therapy Treatment  Patient Details  Name: [name] MRN: [id number] Date of Birth: [birth year] Referring Provider:  [name], MD  Encounter Date: 11/12/2014      PT End of Session - 11/12/14 1345    Visit Number 43   Number of Visits 45   Date for PT Re-Evaluation 11/15/14   Authorization Type medicare   Authorization - Visit Number 82   Authorization - Number of Visits 50   PT Start Time 1300   PT Stop Time 1356   PT Time Calculation (min) 56 min   Equipment Utilized During Treatment Gait belt   Activity Tolerance Patient limited by fatigue   Behavior During Therapy [facility] for tasks assessed/performed      Past Medical History  Diagnosis Date  . Essential hypertension, benign   . COPD (chronic obstructive pulmonary disease)   . Coronary atherosclerosis of native coronary artery     Multivessel status post CABG 1996  . Arthritis   . Atrial fibrillation     Coumadin stopped 12/2011 due to anemia  . Dysphagia   . Anemia     Requiring blood transfusions  . Carotid stenosis     Dr Scot Dock   . Diabetes mellitus, type 2   . Mixed hyperlipidemia     Statin intolerant  . Hematuria     Felt related to Foley insertion  . Cardiomyopathy     LVEF 45-50%  . Melena     EGD & Colonoscopy 09/2011 revealed gastritis, erosions, scars, diverticula, 8 colon polyps (largest 1.6cm, not removed 2/2 anticoagulation), small AVM in transverse colon  . History of tobacco abuse     Quit 1992  . Anxiety   . GERD (gastroesophageal reflux disease)   . Myocardial infarction   . Thrombocytopenia   . Myelodysplastic syndrome with 5 q minus 10/17/2012    On Revlimid 5 mg daily 21 days on and 7 days off  . Aortic stenosis     Mild  . CKD (chronic kidney disease) stage 3, GFR 30-59 ml/min   . Pneumonia 11/2013    history of  . Blood  transfusion without reported diagnosis     Past Surgical History  Procedure Laterality Date  . Knee surgery  1960    Right knee cartilage  . Rotator cuff repair  1990    right   . Inguinal hernia repair  2000 and 2010    left  . Cholecystectomy  05/2010    Lap chole with biologic mesh repair/reinforcement by  Dr Rise Patience  . Back surgery  02/2006    ruptured disk,  post op diskitis infection requiring prolonged hospital stay and  surgical I & D  . Tonsillectomy    . Esophagogastroduodenoscopy  10/08/2011    Procedure: ESOPHAGOGASTRODUODENOSCOPY (EGD);  Surgeon: [name], MD;  [facility];  Service: Endoscopy;  Laterality: N/A;  . Colonoscopy  10/08/2011    Procedure: COLONOSCOPY;  Surgeon: [name], MD;  [facility];  Service: Endoscopy;  Laterality: N/A;  . Cataract extraction, bilateral    . Peg placement  07/2006    placed due to prolonged infection, poor po intake, malnutrition during several week hospital stay resulting from ruptured disc that became infected following surgery  . Colonoscopy  01/14/2012    Procedure: COLONOSCOPY;  Surgeon: [name], MD;  [facility];  Service: Endoscopy;  Laterality: N/A;  945  . Coronary artery bypass graft  1996    CABG x 4  . Ventral hernia repair    . Colonoscopy  04/20/2012    Procedure: COLONOSCOPY;  Surgeon: [name], MD;  [facility];  Service: Endoscopy;  Laterality: N/A;  1030  . Pr vein bypass graft,aorto-fem-pop  1996  . Spine surgery    . Joint replacement  1960    Right Knee  . Lymph node biopsy  08/15/2012    Procedure: LYMPH NODE BIOPSY;  Surgeon: Scherry Ran, MD;  Location: [facility];  Service: General;  Laterality: Left;  Cervical Lymph Node Bx in Minor Room  . Bone marrow biopsy    . Bone marrow aspiration      There were no vitals taken for this visit.  Visit Diagnosis:  Weakness of both legs  Poor balance      Subjective Assessment -  11/12/14 1350    Symptoms Pt states he fell in his kitchen over the weekend and is sore all over.  States he has no idea how it happened.     Currently in Pain? Yes   Pain Score 4                     [facility] Adult PT Treatment/Exercise - 11/12/14 1302    Lumbar Exercises: Stretches   Passive Hamstring Stretch 2 reps;30 seconds   Passive Hamstring Stretch Limitations Gastroc Slantboard   Lumbar Exercises: Aerobic   Stationary Bike Nustep hills #3; Level5 x 15:00   Lumbar Exercises: Standing   Other Standing Lumbar Exercises obstacle course:  balance beam, hurdles varied heights, cone weaving 3RT   Other Standing Lumbar Exercises stairs x 2 RT ; tandem stance; SLS x 5 Bilaterally    Lumbar Exercises: Seated   Sit to Stand 5 reps;Limitations   Sit to Stand Limitations 17" box without UE's                  PT Short Term Goals - 10/16/14 1019    PT SHORT TERM GOAL #1   Title Pt to be able to stand for 10 minutes without increased pain to be able to make a sandwich for a meal   Time 4   Period Weeks   Status Achieved   PT SHORT TERM GOAL #2   Title Pt to be able to walk for 15 minutes for better health habits   Time 4   Period Weeks   Status Achieved   PT SHORT TERM GOAL #3   Title Pt to walk with 20 degree forward flexion     Period Weeks   Status Achieved           PT Long Term Goals - 10/16/14 1019    PT LONG TERM GOAL #1   Title Pt to be able to stand for 20 minutes to socialize   Time 12   Period Weeks   Status On-going   PT LONG TERM GOAL #2   Title Pt to be comfortable walking inside with a cane   Baseline 08/19/14 : Pt reports use of cane 90% of the time in the home.    Time 12   Status On-going   PT LONG TERM GOAL #3   Title Patient to be able to walk with his walker for 30 minutes at a time   Time 8   Period Weeks  Status Achieved   PT LONG TERM GOAL #4   Title forward bent posture to be decreased to only 10 degree forward bend      Time 8   Period Weeks   Status On-going   PT LONG TERM GOAL #5   Title PT to be able to SLS x 10 seconds to reduce risk of falling   Time 12   Period Weeks   Status On-going   Additional Long Term Goals   Additional Long Term Goals Yes   PT LONG TERM GOAL #6   Title Pt to be able to be I with bed mobility    Time 16   Period Weeks   Status New               Plan - 11/12/14 1345    Clinical Impression Statement Pt with increased difficulty with activities today due to soreness and fatigue.  Unable to self correct LOB today requiring max assist from therapist.  Pt. required 2 seated rest breaks during session today.    PT Next Visit Plan Pt to be re-evaluated X 2 more visits. Continue to increase LE stength and stability.          Problem List Patient Active Problem List   Diagnosis Date Noted  . Acute on chronic diastolic congestive heart failure   . Carotid stenosis   . Coronary artery disease involving coronary bypass graft of native heart with other forms of angina pectoris   . Aortic stenosis   . Acute pulmonary edema 09/10/2014  . Weakness of both legs 05/09/2014  . Poor balance 05/09/2014  . Difficulty in walking(719.7) 05/09/2014  . Chest pain 03/23/2014  . CHF (congestive heart failure) 02/12/2014  . Exertional dyspnea 02/12/2014  . Chest pain of uncertain etiology 81/82/9937  . Diabetes 01/01/2014  . Chronic kidney disease (CKD) stage G3b/A2, moderately decreased glomerular filtration rate (GFR) between 30-44 mL/min/1.73 square meter and albuminuria creatinine ratio between 30-299 mg/g 12/14/2013  . NSTEMI (non-ST elevated myocardial infarction) 11/28/2013  . HCAP (healthcare-associated pneumonia) 11/27/2013  . Community acquired pneumonia 11/27/2013  . Acute on chronic systolic heart failure 16/96/7893  . Other pancytopenia 01/08/2013  . Non-ST elevation MI (NSTEMI) 01/06/2013  . High output heart failure 10/25/2012  . Myelodysplastic syndrome with 5  q minus 10/17/2012  . History of colonic polyps 02/28/2012  . Anemia 01/10/2012  . Angina effort 01/06/2012  . AVM (arteriovenous malformation) of colon without hemorrhage 01/04/2012  . GI bleeding 10/09/2011  . Long term current use of anticoagulant 12/22/2010  . CAROTID ARTERY STENOSIS 10/16/2010  . DYSLIPIDEMIA 04/11/2009  . Coronary atherosclerosis 04/11/2009  . Atrial fibrillation 04/11/2009  . Peripheral vascular disease 04/11/2009  . OSTEOARTHRITIS 04/11/2009    Teena Irani, PTA/CLT [phone] 11/12/2014, 1:51 PM  [place] [address] [place], [place], [address] Phone: [phone]   Fax:  [phone]

## 2014-11-12 NOTE — Telephone Encounter (Signed)
Telephone call  

## 2014-11-12 NOTE — Telephone Encounter (Signed)
Called to notify pt of hemoglobin of 9 and that he would not need to be transfused this week.

## 2014-11-14 ENCOUNTER — Ambulatory Visit (HOSPITAL_COMMUNITY): Payer: Medicare Other | Admitting: Physical Therapy

## 2014-11-14 DIAGNOSIS — Z5189 Encounter for other specified aftercare: Secondary | ICD-10-CM | POA: Diagnosis not present

## 2014-11-14 NOTE — Therapy (Signed)
[facility] [address] [place], [place], [address] Phone: [phone]   Fax:  [phone]  Physical Therapy Evaluation  Patient Details  Name: [name] MRN: [id number] Date of Birth: 1931-10-14 Referring Provider:  Ellouise Newer, PA-C  Encounter Date: 11/14/2014      PT End of Session - 11/14/14 0752    Visit Number 44   Number of Visits 44   Date for PT Re-Evaluation 11/15/14   Authorization Type medicare   Authorization - Visit Number 44   Authorization - Number of Visits 44   PT Start Time 1305   PT Stop Time 1345   PT Time Calculation (min) 40 min    PHYSICAL THERAPY DISCHARGE SUMMARY   Plan: Patient agrees to discharge.  Patient goals were partially met. Patient is being discharged due to lack of progress.  ?????      Past Medical History  Diagnosis Date  . Essential hypertension, benign   . COPD (chronic obstructive pulmonary disease)   . Coronary atherosclerosis of native coronary artery     Multivessel status post CABG 1996  . Arthritis   . Atrial fibrillation     Coumadin stopped 12/2011 due to anemia  . Dysphagia   . Anemia     Requiring blood transfusions  . Carotid stenosis     Dr Edilia Bo   . Diabetes mellitus, type 2   . Mixed hyperlipidemia     Statin intolerant  . Hematuria     Felt related to Foley insertion  . Cardiomyopathy     LVEF 45-50%  . Melena     EGD & Colonoscopy 09/2011 revealed gastritis, erosions, scars, diverticula, 8 colon polyps (largest 1.6cm, not removed 2/2 anticoagulation), small AVM in transverse colon  . History of tobacco abuse     Quit 1992  . Anxiety   . GERD (gastroesophageal reflux disease)   . Myocardial infarction   . Thrombocytopenia   . Myelodysplastic syndrome with 5 q minus 10/17/2012    On Revlimid 5 mg daily 21 days on and 7 days off  . Aortic stenosis     Mild  . CKD (chronic kidney disease) stage 3, GFR 30-59 ml/min   . Pneumonia 11/2013     history of  . Blood transfusion without reported diagnosis     Past Surgical History  Procedure Laterality Date  . Knee surgery  1960    Right knee cartilage  . Rotator cuff repair  1990    right   . Inguinal hernia repair  2000 and 2010    left  . Cholecystectomy  05/2010    Lap chole with biologic mesh repair/reinforcement by  Dr Zachery Dakins  . Back surgery  02/2006    ruptured disk,  post op diskitis infection requiring prolonged hospital stay and  surgical I & D  . Tonsillectomy    . Esophagogastroduodenoscopy  10/08/2011    Procedure: ESOPHAGOGASTRODUODENOSCOPY (EGD);  Surgeon: Malissa Hippo, MD;  [facility];  Service: Endoscopy;  Laterality: N/A;  . Colonoscopy  10/08/2011    Procedure: COLONOSCOPY;  Surgeon: Malissa Hippo, MD;  [facility];  Service: Endoscopy;  Laterality: N/A;  . Cataract extraction, bilateral    . Peg placement  07/2006    placed due to prolonged infection, poor po intake, malnutrition during several week hospital stay resulting from ruptured disc that became infected following surgery  . Colonoscopy  01/14/2012    Procedure: COLONOSCOPY;  Surgeon: [name], MD;  [facility];  Service: Endoscopy;  Laterality: N/A;  945  . Coronary artery bypass graft  1996    CABG x 4  . Ventral hernia repair    . Colonoscopy  04/20/2012    Procedure: COLONOSCOPY;  Surgeon: [name], MD;  [facility];  Service: Endoscopy;  Laterality: N/A;  1030  . Pr vein bypass graft,aorto-fem-pop  1996  . Spine surgery    . Joint replacement  1960    Right Knee  . Lymph node biopsy  08/15/2012    Procedure: LYMPH NODE BIOPSY;  Surgeon: Scherry Ran, MD;  Location: [facility];  Service: General;  Laterality: Left;  Cervical Lymph Node Bx in Minor Room  . Bone marrow biopsy    . Bone marrow aspiration      There were no vitals taken for this visit.  Visit Diagnosis:  No diagnosis found.   Subjective:  Pt states that  he is still sore from the fall he had earlier in the week in his kitchen.  No pain just soreness.           PT Short Term Goals - 11/14/14 0757    PT SHORT TERM GOAL #1   Title Pt to be able to stand for 10 minutes without increased pain to be able to make a sandwich for a meal   Period Weeks   Status Achieved   PT SHORT TERM GOAL #2   Title Pt to be able to walk for 15 minutes for better health habits   Time 4   Status Achieved  with walker   PT SHORT TERM GOAL #3   Title Pt to walk with 20 degree forward flexion     Time 4   Period Weeks   Status Achieved           PT Long Term Goals - 11/15/14 0758    PT LONG TERM GOAL #1   Title Pt to be able to stand for 20 minutes to socialize   Time 12   Period Weeks   Status Partially Met   PT LONG TERM GOAL #2   Title Pt to be comfortable walking inside with a cane   Baseline 08/19/14 : Pt reports use of cane 90% of the time in the home.    Time 12   Period Weeks   Status Partially Met   PT LONG TERM GOAL #3   Title Patient to be able to walk with his walker for 30 minutes at a time   Time 8   Period Weeks   Status Achieved   PT LONG TERM GOAL #4   Title forward bent posture to be decreased to only 10 degree forward bend     Time 8   Period Weeks   PT LONG TERM GOAL #5   Title PT to be able to SLS x 10 seconds to reduce risk of falling   Period Weeks   Status Not Met         11/14/14 0001  Assessment  Medical Diagnosis difficulty walking  Observation/Other Assessments  Focus on Therapeutic Outcomes (FOTO)  (FACIT-F121/160)  Other Surveys  (VAS fatigue 6.3; pain 3; distress7.33(lost wife in December))  Strength  Right Hip Extension 3-/5 (was 3-/5)  Left Hip Extension 3-/5 (was 3-/5)  Right Knee Flexion 3+/5 (no change)  Left Knee Flexion 4-/5 (was 4-/5)  Berg Balance Test  Sit to Stand 4  Standing Unsupported 4  Sitting with Back Unsupported but Feet Supported on Floor or Stool 4  Stand to Sit 4   Transfers 3  Standing Unsupported with Eyes Closed 4  Standing Ubsupported with Feet Together 4  From Standing, Reach Forward with Outstretched Arm 4  From Standing Position, Pick up Object from Floor 4  From Standing Position, Turn to Look Behind Over each Shoulder 3  Turn 360 Degrees 2  Standing Unsupported, Alternately Place Feet on Step/Stool 4  Standing Unsupported, One Foot in Front 2  Standing on One Leg 1  Total Score 47  Timed Up and Go Test  TUG Normal TUG  Normal TUG (seconds) 23  TUG Comments used walker            Plan - 11/14/14 0753    Clinical Impression Statement [name] has not shown significant improvement in his functional ability.  He has good strength but his functional balance Merrilee Jansky shows pt does not need a walker but pt is unsafe without a walker TUG with a walker rates a fall risk and pt has been falling at home).  Pt and therapist discussed that although pt is not regressing he is not progressing despite full effort in therapy.  Pt will be discharged to a HEP.  Pt was encouraged to both walk everyday for exercise and to join the [facility]- senior sneakers to stay active.     PT Next Visit Plan Discharge to a HEP           G-Codes - 2014/11/16 0759    Functional Assessment Tool Used clinical judgement   Functional Limitation Mobility: Walking and moving around   Mobility: Walking and Moving Around Goal Status [phone]) At least 1 percent but less than 20 percent impaired, limited or restricted   Mobility: Walking and Moving Around Discharge Status [phone]) At least 20 percent but less than 40 percent impaired, limited or restricted       Problem List Patient Active Problem List   Diagnosis Date Noted  . Acute on chronic diastolic congestive heart failure   . Carotid stenosis   . Coronary artery disease involving coronary bypass graft of native heart with other forms of angina pectoris   . Aortic stenosis   . Acute pulmonary edema 09/10/2014  .  Weakness of both legs 05/09/2014  . Poor balance 05/09/2014  . Difficulty in walking(719.7) 05/09/2014  . Chest pain 03/23/2014  . CHF (congestive heart failure) 02/12/2014  . Exertional dyspnea 02/12/2014  . Chest pain of uncertain etiology 91/63/8466  . Diabetes 01/01/2014  . Chronic kidney disease (CKD) stage G3b/A2, moderately decreased glomerular filtration rate (GFR) between 30-44 mL/min/1.73 square meter and albuminuria creatinine ratio between 30-299 mg/g 12/14/2013  . NSTEMI (non-ST elevated myocardial infarction) 11/28/2013  . HCAP (healthcare-associated pneumonia) 11/27/2013  . Community acquired pneumonia 11/27/2013  . Acute on chronic systolic heart failure 59/93/5701  . Other pancytopenia 01/08/2013  . Non-ST elevation MI (NSTEMI) 01/06/2013  . High output heart failure 10/25/2012  . Myelodysplastic syndrome with 5 q minus 10/17/2012  . History of colonic polyps 02/28/2012  . Anemia 01/10/2012  . Angina effort 01/06/2012  . AVM (arteriovenous malformation) of colon without hemorrhage 01/04/2012  . GI bleeding 10/09/2011  . Long term current use of anticoagulant 12/22/2010  . CAROTID ARTERY STENOSIS 10/16/2010  . DYSLIPIDEMIA 04/11/2009  . Coronary atherosclerosis 04/11/2009  . Atrial fibrillation 04/11/2009  . Peripheral vascular disease 04/11/2009  . OSTEOARTHRITIS 04/11/2009    RUSSELL,CINDY  11/15/2014, 8:00 AM  [place], [place], [address] Phone: [phone]   Fax:  [phone]

## 2014-11-17 ENCOUNTER — Encounter (HOSPITAL_COMMUNITY): Payer: Self-pay | Admitting: Emergency Medicine

## 2014-11-17 ENCOUNTER — Emergency Department (HOSPITAL_COMMUNITY)
Admission: EM | Admit: 2014-11-17 | Discharge: 2014-11-17 | Disposition: A | Discharge status: Home / Self Care | Payer: Medicare Other | Attending: Emergency Medicine | Admitting: Emergency Medicine

## 2014-11-17 ENCOUNTER — Emergency Department (HOSPITAL_COMMUNITY): Payer: Medicare Other

## 2014-11-17 DIAGNOSIS — Y9289 Other specified places as the place of occurrence of the external cause: Secondary | ICD-10-CM | POA: Diagnosis not present

## 2014-11-17 DIAGNOSIS — Z79899 Other long term (current) drug therapy: Secondary | ICD-10-CM | POA: Insufficient documentation

## 2014-11-17 DIAGNOSIS — S3992XA Unspecified injury of lower back, initial encounter: Secondary | ICD-10-CM | POA: Insufficient documentation

## 2014-11-17 DIAGNOSIS — W1839XA Other fall on same level, initial encounter: Secondary | ICD-10-CM | POA: Insufficient documentation

## 2014-11-17 DIAGNOSIS — Y9389 Activity, other specified: Secondary | ICD-10-CM | POA: Diagnosis not present

## 2014-11-17 DIAGNOSIS — Z862 Personal history of diseases of the blood and blood-forming organs and certain disorders involving the immune mechanism: Secondary | ICD-10-CM | POA: Insufficient documentation

## 2014-11-17 DIAGNOSIS — S4992XA Unspecified injury of left shoulder and upper arm, initial encounter: Secondary | ICD-10-CM | POA: Insufficient documentation

## 2014-11-17 DIAGNOSIS — W19XXXA Unspecified fall, initial encounter: Secondary | ICD-10-CM

## 2014-11-17 DIAGNOSIS — Z7982 Long term (current) use of aspirin: Secondary | ICD-10-CM | POA: Diagnosis not present

## 2014-11-17 DIAGNOSIS — I10 Essential (primary) hypertension: Secondary | ICD-10-CM | POA: Insufficient documentation

## 2014-11-17 DIAGNOSIS — S199XXA Unspecified injury of neck, initial encounter: Secondary | ICD-10-CM | POA: Diagnosis present

## 2014-11-17 DIAGNOSIS — Y998 Other external cause status: Secondary | ICD-10-CM | POA: Insufficient documentation

## 2014-11-17 HISTORY — DX: Dorsalgia, unspecified: M54.9

## 2014-11-17 HISTORY — DX: Encounter for other specified aftercare: Z51.89

## 2014-11-17 NOTE — Discharge Instructions (Signed)
Contusion °A contusion is a deep bruise. Contusions are the result of an injury that caused bleeding under the skin. The contusion may turn blue, purple, or yellow. Minor injuries will give you a painless contusion, but more severe contusions may stay painful and swollen for a few weeks.  °CAUSES  °A contusion is usually caused by a blow, trauma, or direct force to an area of the body. °SYMPTOMS  °· Swelling and redness of the injured area. °· Bruising of the injured area. °· Tenderness and soreness of the injured area. °· Pain. °DIAGNOSIS  °The diagnosis can be made by taking a history and physical exam. An X-ray, CT scan, or MRI may be needed to determine if there were any associated injuries, such as fractures. °TREATMENT  °Specific treatment will depend on what area of the body was injured. In general, the best treatment for a contusion is resting, icing, elevating, and applying cold compresses to the injured area. Over-the-counter medicines may also be recommended for pain control. Ask your caregiver what the best treatment is for your contusion. °HOME CARE INSTRUCTIONS  °· Put ice on the injured area. °¨ Put ice in a plastic bag. °¨ Place a towel between your skin and the bag. °¨ Leave the ice on for 15-20 minutes, 3-4 times a day, or as directed by your health care provider. °· Only take over-the-counter or prescription medicines for pain, discomfort, or fever as directed by your caregiver. Your caregiver may recommend avoiding anti-inflammatory medicines (aspirin, ibuprofen, and naproxen) for 48 hours because these medicines may increase bruising. °· Rest the injured area. °· If possible, elevate the injured area to reduce swelling. °SEEK IMMEDIATE MEDICAL CARE IF:  °· You have increased bruising or swelling. °· You have pain that is getting worse. °· Your swelling or pain is not relieved with medicines. °MAKE SURE YOU:  °· Understand these instructions. °· Will watch your condition. °· Will get help right  away if you are not doing well or get worse. °Document Released: 06/23/2005 Document Revised: 09/18/2013 Document Reviewed: 07/19/2011 °[employer]® Patient Information ©2015 [employer]. This information is not intended to replace advice given to you by your health care provider. Make sure you discuss any questions you have with your health care provider. ° °

## 2014-11-17 NOTE — ED Provider Notes (Signed)
CSN: [id number]     Arrival date & time 11/17/14  1818 History   First MD Initiated Contact with Patient 11/17/14 1823     Chief Complaint  Patient presents with  . Fall     (Consider location/radiation/quality/duration/timing/severity/associated sxs/prior Treatment) HPI Comments: Patient presents to the ER for evaluation after a fall. Patient reports that he was trying to sit down on his recliner, set on the edge of the recliner and he fell over. He fell to the floor. Patient complaining of pain in the left shoulder, left lower back and hip area. He has some pain in his neck as well. He did not lose consciousness.  Patient is a 81 y.o. male presenting with fall.  Fall    Past Medical History  Diagnosis Date  . Hypertension   . Back pain   . Anemia   . Encounter for blood transfusion    Past Surgical History  Procedure Laterality Date  . Back surgery    . Exploration post operative open heart    . Cholecystectomy    . Hernia repair    . Knee surgery     No family history on file. History  Substance Use Topics  . Smoking status: Never Smoker   . Smokeless tobacco: Not on file  . Alcohol Use: No    Review of Systems  Musculoskeletal: Positive for back pain and neck pain.  All other systems reviewed and are negative.     Allergies  Review of patient's allergies indicates no known allergies.  Home Medications   Prior to Admission medications   Medication Sig Start Date End Date Taking? Authorizing Provider  amLODipine (NORVASC) 5 MG tablet Take 5 mg by mouth daily. 09/17/14  Yes Historical Provider, MD  aspirin EC 81 MG tablet Take 81 mg by mouth every evening.   Yes Historical Provider, MD  diazepam (VALIUM) 10 MG tablet Take 10 mg by mouth at bedtime. 10/24/14  Yes Historical Provider, MD  Furosemide (LASIX PO) Take 1 tablet by mouth daily as needed (for swelling).   Yes Historical Provider, MD  HYDROcodone-acetaminophen (NORCO) 10-325 MG per tablet Take 1  tablet by mouth every 6 (six) hours as needed. For pain 10/24/14  Yes Historical Provider, MD  isosorbide mononitrate (IMDUR) 30 MG 24 hr tablet Take 30 mg by mouth daily. 11/05/14  Yes Historical Provider, MD  metoprolol tartrate (LOPRESSOR) 25 MG tablet Take 25 mg by mouth 2 (two) times daily. 10/09/14  Yes Historical Provider, MD  pregabalin (LYRICA) 150 MG capsule Take 150 mg by mouth daily.   Yes Historical Provider, MD  tamsulosin (FLOMAX) 0.4 MG CAPS capsule Take 1 capsule by mouth daily. 10/24/14  Yes Historical Provider, MD   BP 110/50 mmHg  Pulse 68  Temp(Src) 98.2 F (36.8 C) (Oral)  Resp 16  Ht 5' 11.5" (1.816 m)  Wt 192 lb (87.091 kg)  BMI 26.41 kg/m2  SpO2 96% Physical Exam  Constitutional: He is oriented to person, place, and time. He appears well-developed and well-nourished. No distress.  HENT:  Head: Normocephalic and atraumatic.  Right Ear: Hearing normal.  Left Ear: Hearing normal.  Nose: Nose normal.  Mouth/Throat: Oropharynx is clear and moist and mucous membranes are normal.  Eyes: Conjunctivae and EOM are normal. Pupils are equal, round, and reactive to light.  Neck: Normal range of motion. Neck supple.  Cardiovascular: Regular rhythm, S1 normal and S2 normal.  Exam reveals no gallop and no friction rub.   No murmur  heard. Pulmonary/Chest: Effort normal and breath sounds normal. No respiratory distress. He exhibits no tenderness.  Abdominal: Soft. Normal appearance and bowel sounds are normal. There is no hepatosplenomegaly. There is no tenderness. There is no rebound, no guarding, no tenderness at McBurney's point and negative Murphy's sign. No hernia.  Musculoskeletal:       Left shoulder: He exhibits decreased range of motion and tenderness. He exhibits no deformity.       Left hip: He exhibits tenderness.       Lumbar back: He exhibits tenderness.       Back:  Neurological: He is alert and oriented to person, place, and time. He has normal strength. No  cranial nerve deficit or sensory deficit. Coordination normal. GCS eye subscore is 4. GCS verbal subscore is 5. GCS motor subscore is 6.  Skin: Skin is warm, dry and intact. No rash noted. No cyanosis.  Psychiatric: He has a normal mood and affect. His speech is normal and behavior is normal. Thought content normal.  Nursing note and vitals reviewed.   ED Course  Procedures (including critical care time) Labs Review Labs Reviewed - No data to display  Imaging Review No results found.   EKG Interpretation   Date/Time:  Sunday November 17 2014 18:23:58 EST Ventricular Rate:  75 PR Interval:  192 QRS Duration: 91 QT Interval:  438 QTC Calculation: 489 R Axis:   57 Text Interpretation:  Sinus rhythm Anteroseptal infarct, old Borderline  repolarization abnormality No previous tracing Confirmed by POLLINA  MD,  CHRISTOPHER [phone]) on 11/17/2014 6:31:34 PM      MDM   Final diagnoses:  Fall   Patient presented to the ER after a fall. Patient was primarily complaining of left shoulder pain, but was found to have some lower back, neck and left hip pain on examination. X-rays were negative. Patient's left hip pain was mild and he was able to ambulate without difficulty after x-ray, no concern for occult fracture. Family was concerned that the patient lives alone, but he does not have any reason for admission. It was recommended that a family member stay with him and he was discharged.     Orpah Greek, MD 11/20/14 [phone]

## 2014-11-17 NOTE — ED Notes (Signed)
PT stated was coming back to the living room to sit down and missed his recliner and fell to the floor. PT c/o neck/back pain and left shoulder pain. PT alert and oriented.

## 2014-11-18 ENCOUNTER — Encounter (HOSPITAL_BASED_OUTPATIENT_CLINIC_OR_DEPARTMENT_OTHER): Payer: Medicare Other

## 2014-11-18 ENCOUNTER — Ambulatory Visit (HOSPITAL_COMMUNITY): Payer: Medicare Other | Admitting: Physical Therapy

## 2014-11-18 DIAGNOSIS — I5083 High output heart failure: Secondary | ICD-10-CM

## 2014-11-18 DIAGNOSIS — D46C Myelodysplastic syndrome with isolated del(5q) chromosomal abnormality: Secondary | ICD-10-CM

## 2014-11-18 LAB — CBC
HCT: 21.4 % — ABNORMAL LOW (ref 39.0–52.0)
HEMOGLOBIN: 7.4 g/dL — AB (ref 13.0–17.0)
MCH: 31.9 pg (ref 26.0–34.0)
MCHC: 34.6 g/dL (ref 30.0–36.0)
MCV: 92.2 fL (ref 78.0–100.0)
Platelets: 92 10*3/uL — ABNORMAL LOW (ref 150–400)
RBC: 2.32 MIL/uL — ABNORMAL LOW (ref 4.22–5.81)
RDW: 16.7 % — ABNORMAL HIGH (ref 11.5–15.5)
WBC: 3.4 10*3/uL — ABNORMAL LOW (ref 4.0–10.5)

## 2014-11-18 LAB — PREPARE RBC (CROSSMATCH)

## 2014-11-18 LAB — FERRITIN: Ferritin: 3751 ng/mL — ABNORMAL HIGH (ref 22–322)

## 2014-11-18 MED ORDER — FUROSEMIDE 10 MG/ML IJ SOLN: 20.0000 mg | Freq: Once | INTRAMUSCULAR | Status: DC

## 2014-11-18 NOTE — Progress Notes (Addendum)
LABS FOR CBC, FERR... ADDED TYSCR, PREPARE

## 2014-11-18 NOTE — Addendum Note (Signed)
Addended by: Kurtis Bushman A on: 11/18/2014 12:05 PM   Modules accepted: Orders, SmartSet

## 2014-11-19 ENCOUNTER — Encounter (HOSPITAL_COMMUNITY): Payer: Medicare Other

## 2014-11-19 ENCOUNTER — Other Ambulatory Visit (HOSPITAL_COMMUNITY): Payer: Self-pay | Admitting: Oncology

## 2014-11-19 DIAGNOSIS — D46C Myelodysplastic syndrome with isolated del(5q) chromosomal abnormality: Secondary | ICD-10-CM

## 2014-11-20 LAB — TYPE AND SCREEN
ABO/RH(D): A NEG
Antibody Screen: NEGATIVE
UNIT DIVISION: 0
Unit division: 0

## 2014-11-21 ENCOUNTER — Encounter (HOSPITAL_COMMUNITY): Payer: Medicare Other | Admitting: Physical Therapy

## 2014-11-22 ENCOUNTER — Encounter (HOSPITAL_COMMUNITY): Payer: Self-pay

## 2014-11-25 ENCOUNTER — Encounter (HOSPITAL_COMMUNITY): Payer: Medicare Other

## 2014-11-25 ENCOUNTER — Encounter (HOSPITAL_COMMUNITY): Payer: Medicare Other | Admitting: Physical Therapy

## 2014-11-26 DEATH — deceased

## 2014-11-28 ENCOUNTER — Encounter (HOSPITAL_COMMUNITY): Payer: Medicare Other | Admitting: Physical Therapy

## 2014-12-02 ENCOUNTER — Encounter (HOSPITAL_COMMUNITY): Payer: Medicare Other

## 2014-12-09 ENCOUNTER — Encounter (HOSPITAL_COMMUNITY): Payer: Medicare Other

## 2014-12-16 ENCOUNTER — Encounter (HOSPITAL_COMMUNITY): Payer: Medicare Other

## 2014-12-18 ENCOUNTER — Ambulatory Visit (HOSPITAL_COMMUNITY): Payer: Medicare Other | Admitting: Hematology & Oncology

## 2014-12-18 ENCOUNTER — Encounter (HOSPITAL_COMMUNITY): Payer: Medicare Other

## 2014-12-23 ENCOUNTER — Encounter (HOSPITAL_COMMUNITY): Payer: Medicare Other

## 2015-10-29 IMAGING — US US RENAL
1 series · 14 of 25 positions shown · non-contrast
Comparison: None.

CLINICAL DATA: Assess for possible obstruction, evaluate renal
size.

EXAM:
RENAL/URINARY TRACT ULTRASOUND COMPLETE

[Series 1: us renal · 0.22mm/px · 14 of 46 slices shown]
[im 1/46]
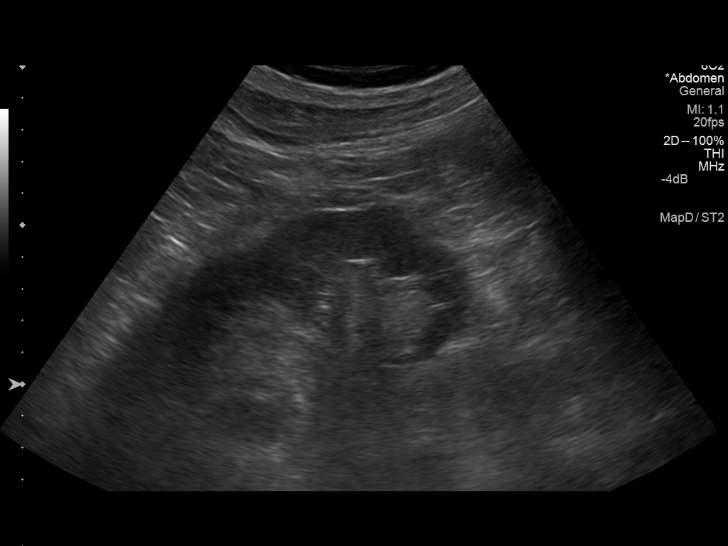
[im 4/46]
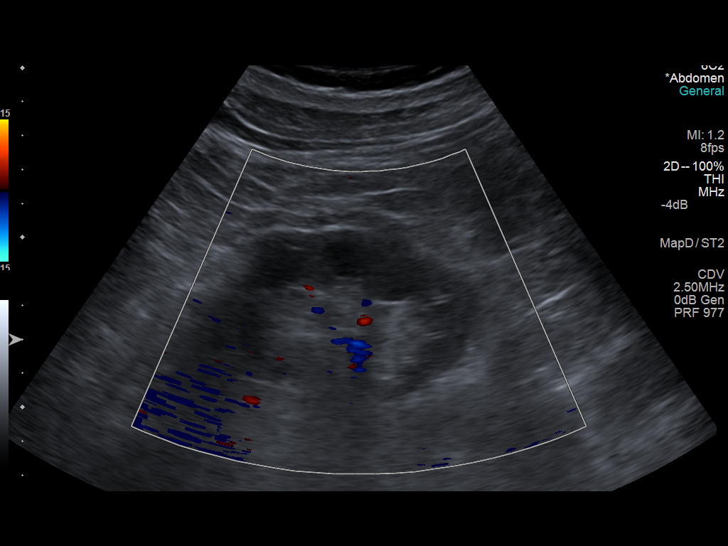
[im 8/46]
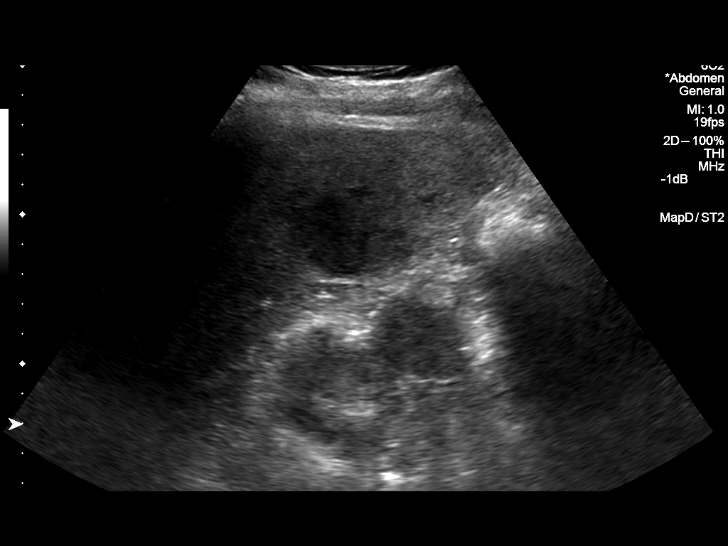
[im 12/46]
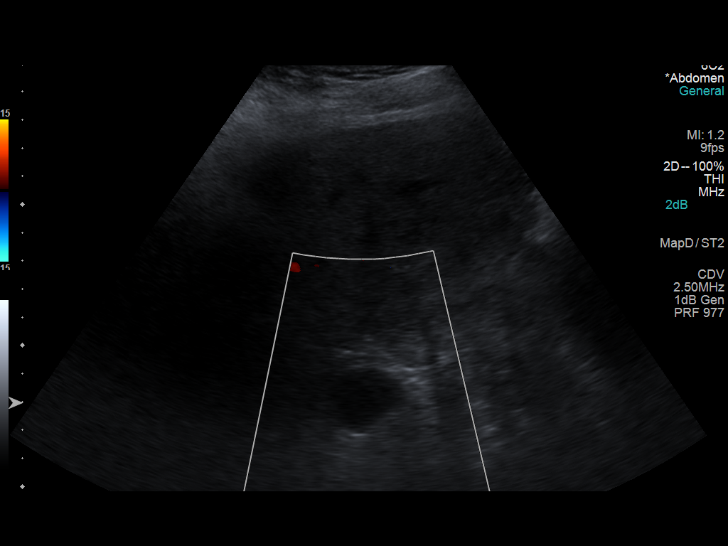
[im 16/46]
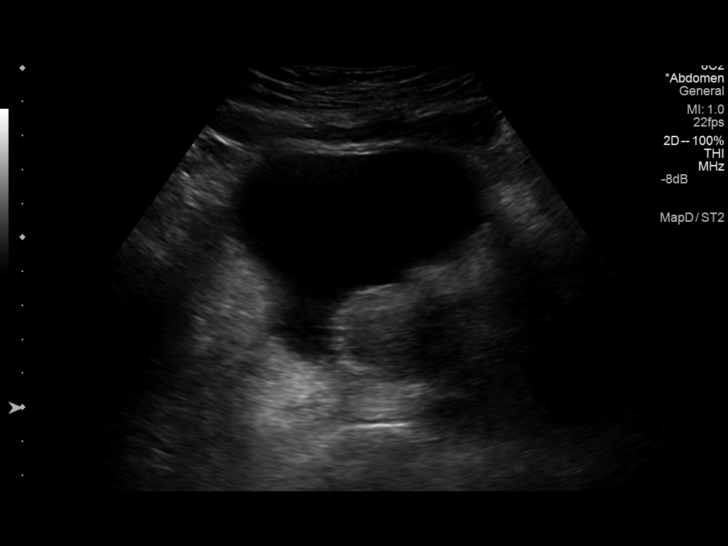
[im 17/46]
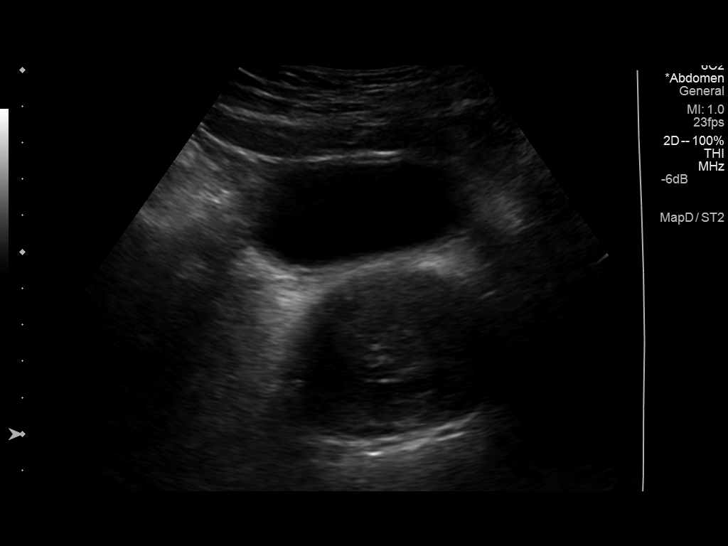
[im 21/46]
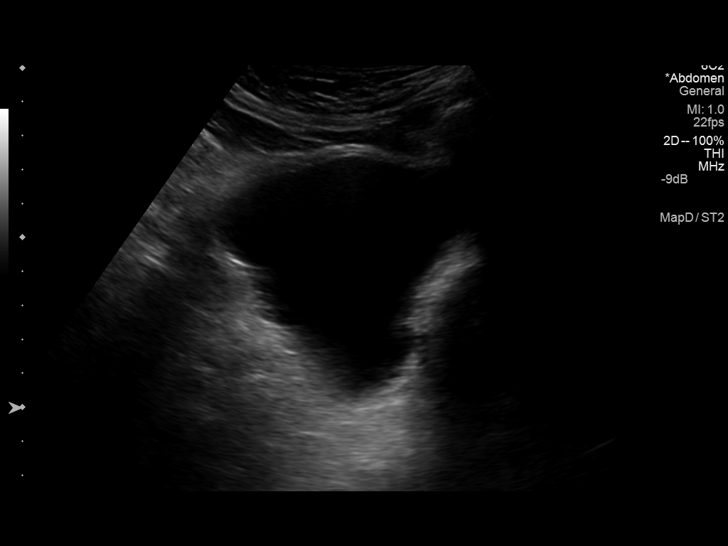
[im 25/46]
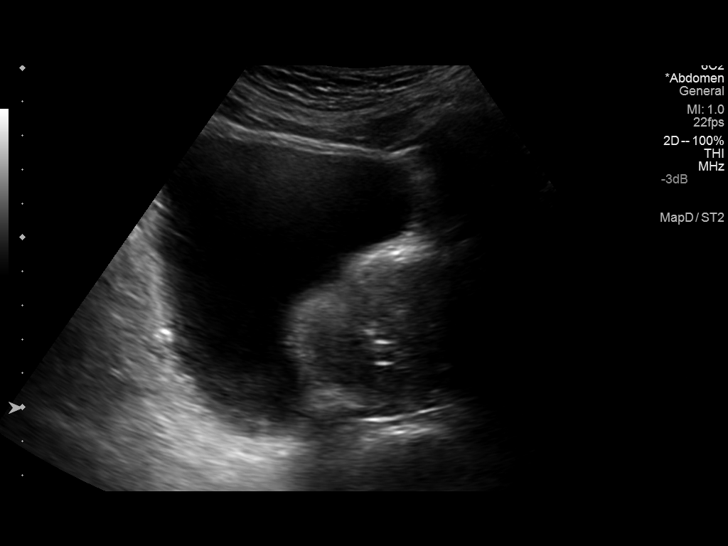
[im 29/46]
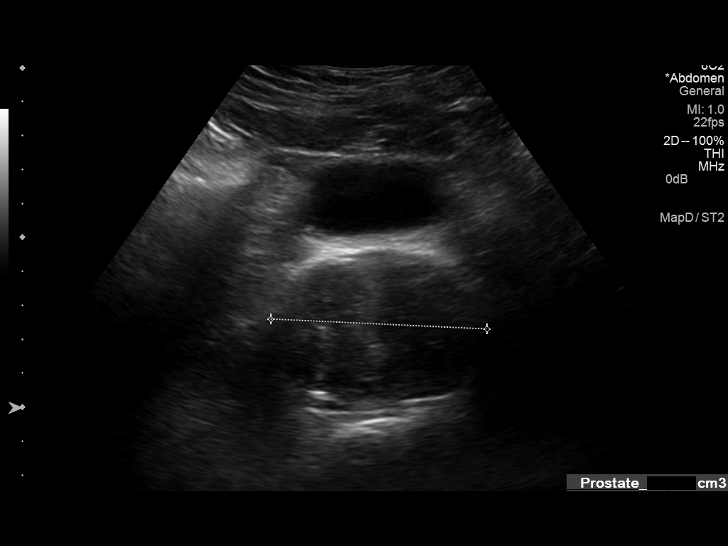
[im 31/46]
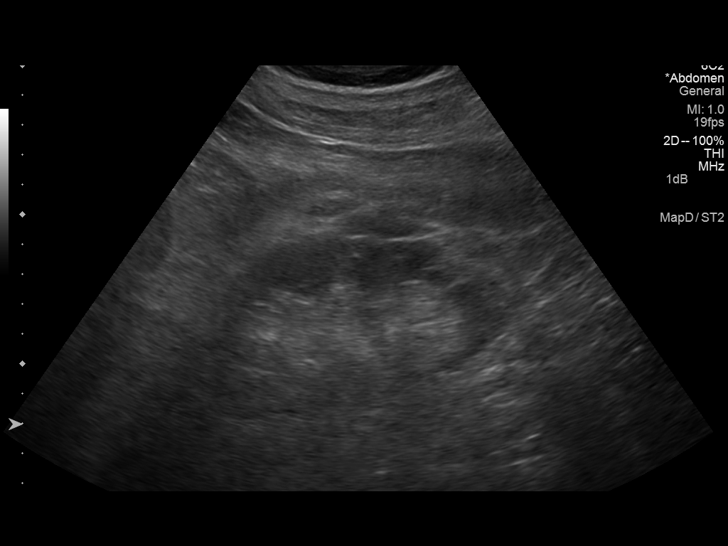
[im 34/46]
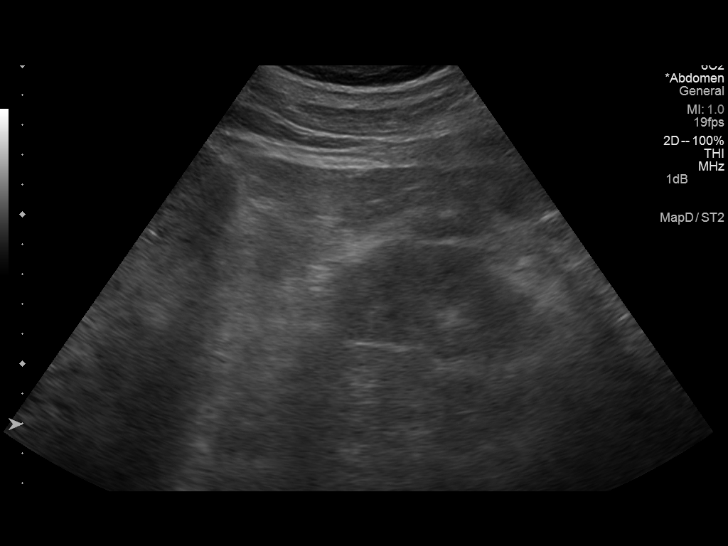
[im 38/46]
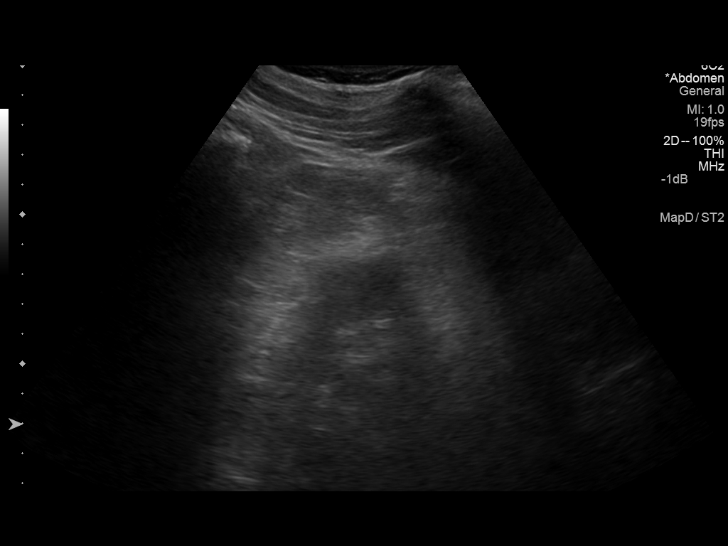
[im 42/46]
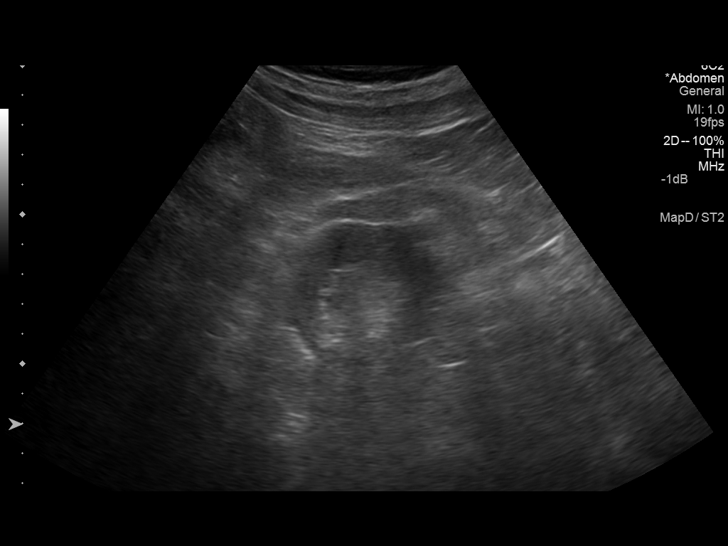
[im 46/46]
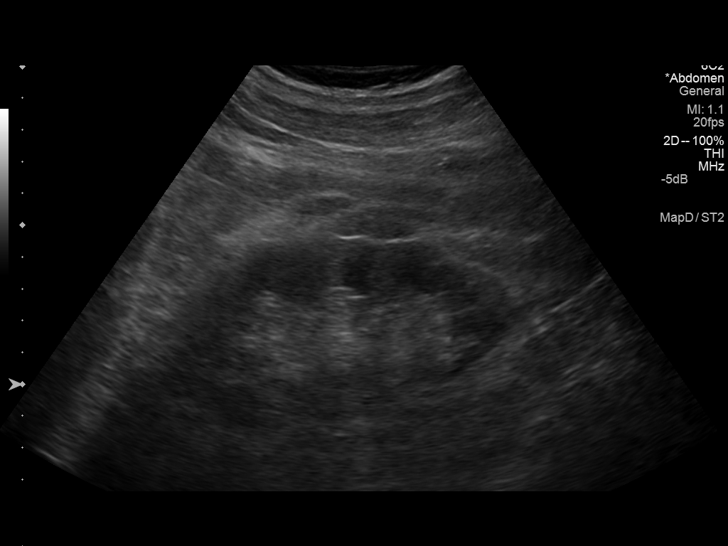

[14 of 25 positions shown; findings below may reference images not displayed]

FINDINGS: Right Kidney:

Length: 10.7 cm. The echotexture of the renal cortex is lower than
that of the adjacent liver which is normal. There is an upper pole
cyst measuring 2.6 cm in diameter.

Left Kidney:

Length: 10.6 cm. Echogenicity within normal limits. No mass or
hydronephrosis visualized.

Bladder:

The prostate gland is enlarged and produces a moderate impression
upon the urinary bladder base. No acute bladder abnormality is
demonstrated.
IMPRESSION: 1. The kidneys demonstrate normal size and echotexture. There is no
evidence of hydronephrosis.
2. There is a simple appearing cyst in the upper pole of the right
kidney.
3. There is prostatic enlargement with produces a prominent
impression upon the urinary bladder base.

## 2016-01-08 IMAGING — CR DG CHEST 1V PORT
1 series · 2 of 2 positions shown · non-contrast
Comparison: Portable chest x-ray December 07, 2013

CLINICAL DATA: Chest pressure and shortness of breath since this
morning; history of open heart surgery in 2007

EXAM:
PORTABLE CHEST - 1 VIEW

[Series 1: portable · 0.17mm/px · 2 of 2 slices shown]
[im 1/2]
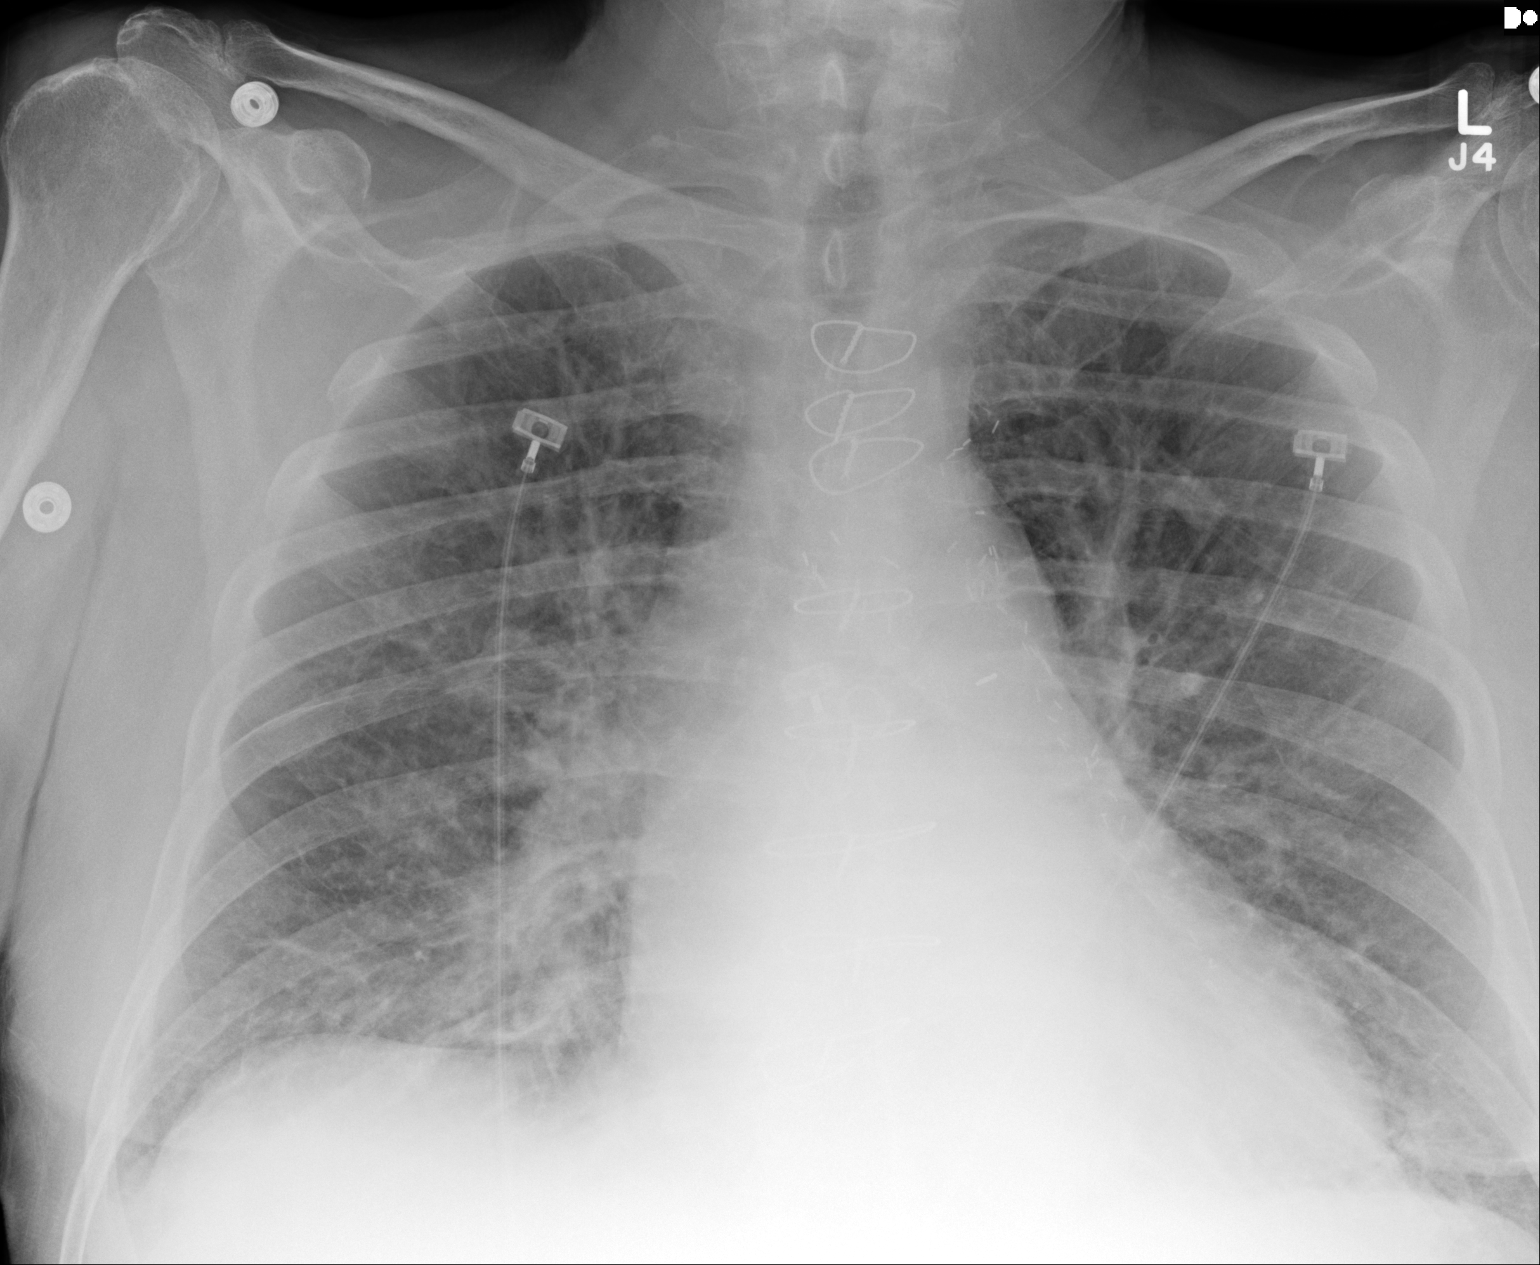
[im 2/2]
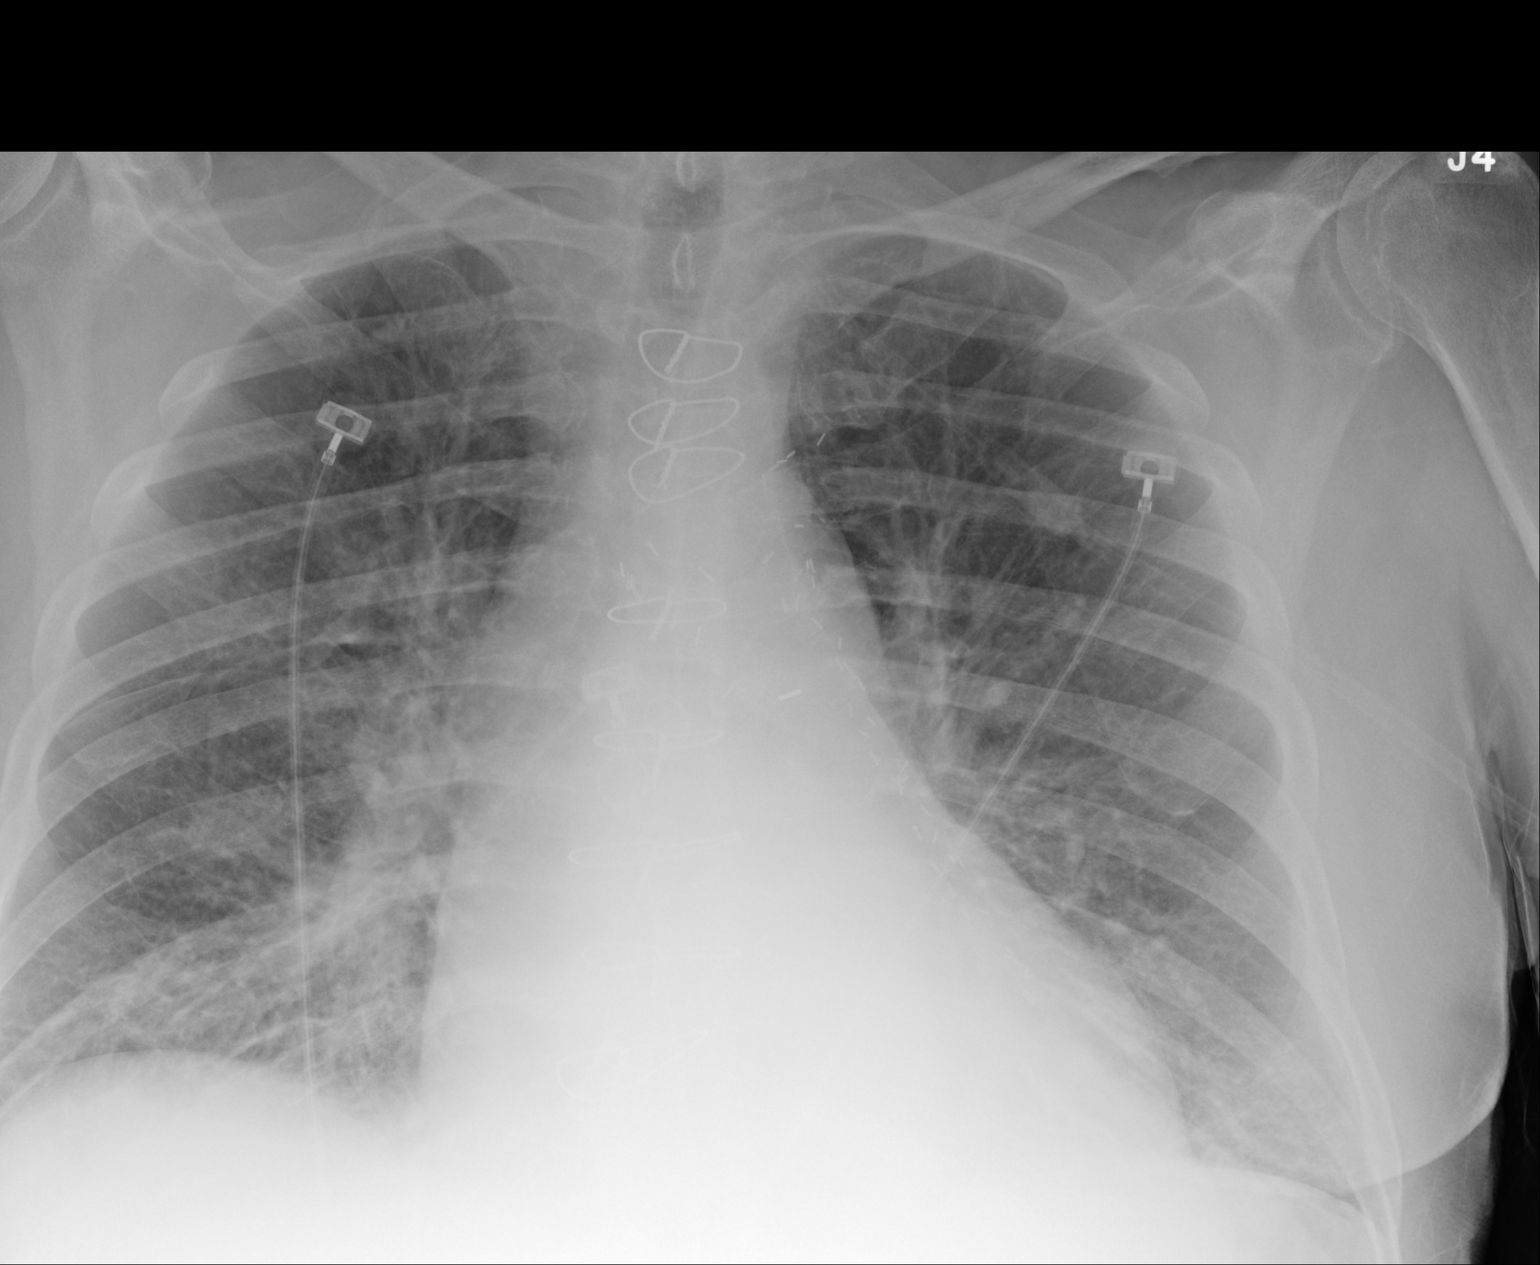

[2 of 2 positions shown; findings below may reference images not displayed]

FINDINGS: The lungs are well-expanded. The pulmonary interstitial markings
remain increased. The pulmonary vascularity remains engorged but is
not as prominent as on the earlier study. The cardiopericardial
silhouette is top-normal in size. The mediastinum is not abnormally
widened. There are post median sternotomy changes present with
evidence of previous CABG.
IMPRESSION: Findings are consistent with congestive heart failure with pulmonary
interstitial edema. The pulmonary vascular congestion is not quite
as severe as on the previous study.

## 2016-08-05 IMAGING — CT CT ANGIO CHEST
2 of 6 series · 6 of 36 positions shown · IV contrast (omnipaque)
Comparison: 08/15/2012 chest CT.

CLINICAL DATA: Chest pain.  Chest heaviness.  Pulmonary embolism.

EXAM:
CT ANGIOGRAPHY CHEST WITH CONTRAST
TECHNIQUE: Multidetector CT imaging of the chest was performed using the
standard protocol during bolus administration of intravenous
contrast. Multiplanar CT image reconstructions and MIPs were
obtained to evaluate the vascular anatomy.
CONTRAST:  100mL OMNIPAQUE IOHEXOL 350 MG/ML SOLN

[Series 4: pe 3.0 b40f · axial · 0.82mm/px · z∈[-334,-130]mm · 5 of 103 slices shown]
[im 18/103  lung]
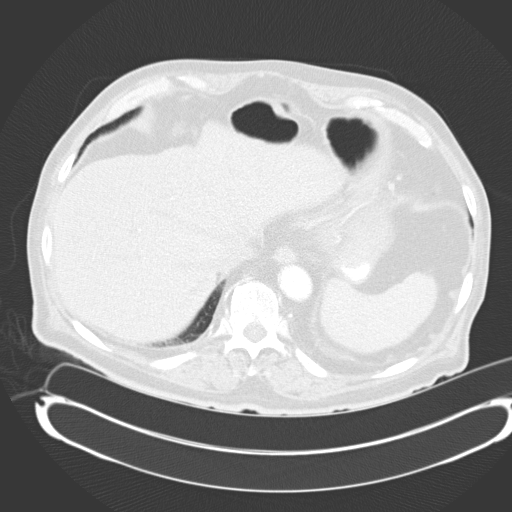
[im 35/103  mediastinal]
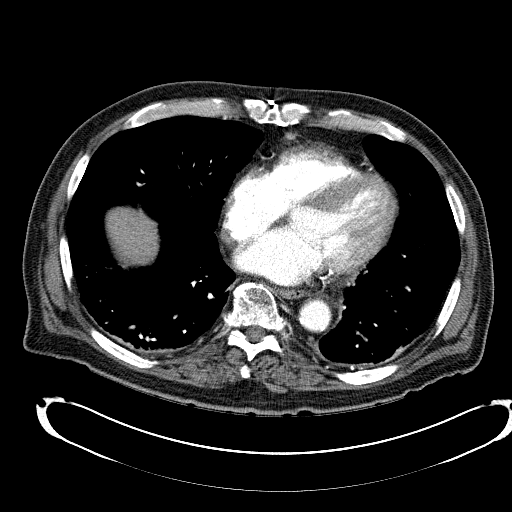
[im 52/103  lung]
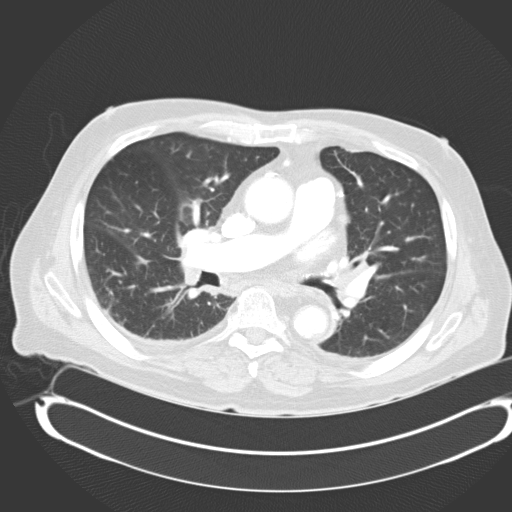
[im 69/103  mediastinal]
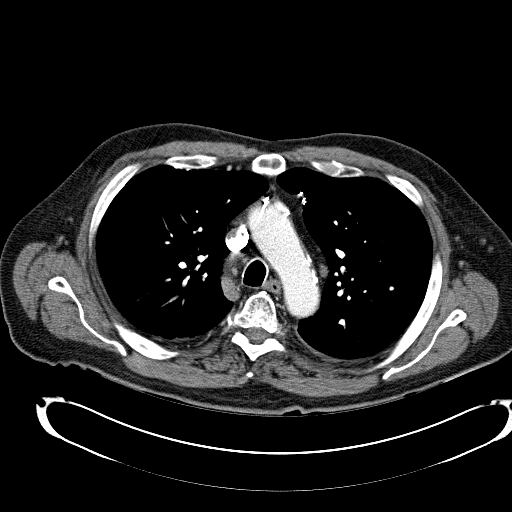
[im 86/103  lung]
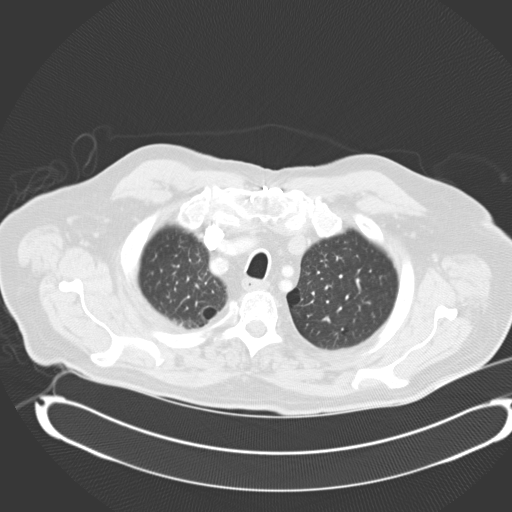

[Series 6: mpr coronal pe 3mm · coronal · 0.62mm/px · 1 of 93 slices shown]
[im 47/93  mediastinal]
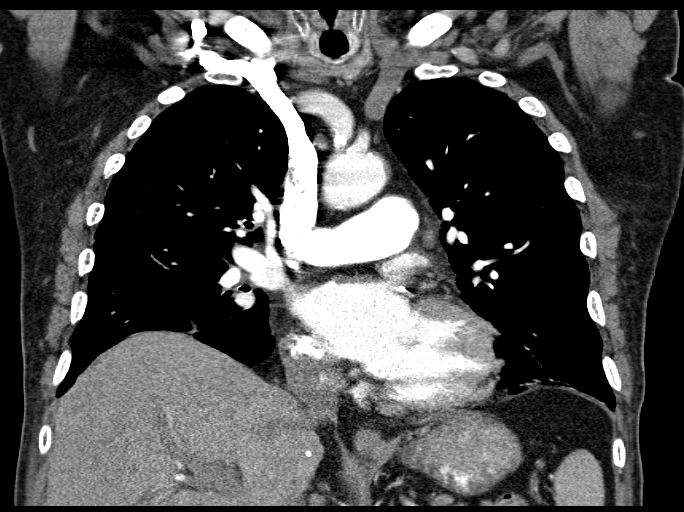

[6 of 36 positions shown; findings below may reference images not displayed]

FINDINGS: Bones: No aggressive osseous lesions. Median sternotomy. No
displaced rib fractures.

Cardiovascular: Technically adequate study. Negative for pulmonary
embolus. Aortic and branch vessel atherosclerosis.

Lungs: Scattered areas of subsegmental atelectasis with dependent
atelectasis in the lungs. Paraseptal emphysema at the apices. Mild
interlobular septal thickening is present at the apices, compatible
with interstitial pulmonary edema.

New 4 mm RIGHT upper lobe pulmonary nodule (image 39 series 5).

Central airways: Tenacious secretions in the RIGHT dependent
trachea.

Effusions: Small LEFT pleural effusion.

Lymphadenopathy: Prominent hilar lymph void tissue is present which
is symmetric bilaterally without discretely enlarged lymph nodes.
Given prior chest radiograph and presence of interstitial pulmonary
edema, this is probably congestive. No axillary adenopathy. No
mediastinal adenopathy is present.

Esophagus: Normal.

Upper abdomen: Cholecystectomy.  No acute abnormality.

Other: None.

Review of the MIP images confirms the above findings.
IMPRESSION: 1. Negative for pulmonary embolism or acute abnormality.
2. Scattered areas of atelectasis in the lungs compatible with low
volumes.
3. Constellation of findings compatible with mild CHF with probable
interstitial pulmonary edema. Small LEFT pleural effusion.
4. New 4 mm RIGHT upper lobe pulmonary nodule. Given risk factors
for bronchogenic carcinoma, follow-up chest CT at 1 year is
recommended. This recommendation follows the consensus statement:
Guidelines for Management of Small Pulmonary Nodules Detected on CT
Scans: A Statement from the [HOSPITAL] as published in
5. Apical paraseptal emphysema.
